# Patient Record
Sex: Female | Born: 1940 | Race: White | Hispanic: No | Smoking: Former smoker
Health system: Southern US, Community
[De-identification: ages and names within clinical notes are randomized; demographics above are authoritative.]

## PROBLEM LIST (undated history)

## (undated) DIAGNOSIS — C3490 Malignant neoplasm of unspecified part of unspecified bronchus or lung: Secondary | ICD-10-CM

## (undated) DIAGNOSIS — J42 Unspecified chronic bronchitis: Secondary | ICD-10-CM

## (undated) DIAGNOSIS — M810 Age-related osteoporosis without current pathological fracture: Secondary | ICD-10-CM

## (undated) DIAGNOSIS — M1990 Unspecified osteoarthritis, unspecified site: Secondary | ICD-10-CM

## (undated) DIAGNOSIS — F32A Depression, unspecified: Secondary | ICD-10-CM

## (undated) DIAGNOSIS — N2581 Secondary hyperparathyroidism of renal origin: Secondary | ICD-10-CM

## (undated) DIAGNOSIS — N183 Chronic kidney disease, stage 3 unspecified: Secondary | ICD-10-CM

## (undated) DIAGNOSIS — I219 Acute myocardial infarction, unspecified: Secondary | ICD-10-CM

## (undated) DIAGNOSIS — E785 Hyperlipidemia, unspecified: Secondary | ICD-10-CM

## (undated) DIAGNOSIS — J449 Chronic obstructive pulmonary disease, unspecified: Secondary | ICD-10-CM

## (undated) DIAGNOSIS — F329 Major depressive disorder, single episode, unspecified: Secondary | ICD-10-CM

## (undated) DIAGNOSIS — G459 Transient cerebral ischemic attack, unspecified: Secondary | ICD-10-CM

## (undated) DIAGNOSIS — H179 Unspecified corneal scar and opacity: Secondary | ICD-10-CM

## (undated) DIAGNOSIS — F4320 Adjustment disorder, unspecified: Secondary | ICD-10-CM

## (undated) DIAGNOSIS — R0902 Hypoxemia: Secondary | ICD-10-CM

## (undated) DIAGNOSIS — J439 Emphysema, unspecified: Secondary | ICD-10-CM

## (undated) DIAGNOSIS — I4891 Unspecified atrial fibrillation: Secondary | ICD-10-CM

## (undated) DIAGNOSIS — F419 Anxiety disorder, unspecified: Secondary | ICD-10-CM

## (undated) DIAGNOSIS — Z9181 History of falling: Secondary | ICD-10-CM

## (undated) DIAGNOSIS — Z8781 Personal history of (healed) traumatic fracture: Secondary | ICD-10-CM

## (undated) DIAGNOSIS — K5790 Diverticulosis of intestine, part unspecified, without perforation or abscess without bleeding: Secondary | ICD-10-CM

## (undated) DIAGNOSIS — J309 Allergic rhinitis, unspecified: Secondary | ICD-10-CM

## (undated) DIAGNOSIS — I10 Essential (primary) hypertension: Secondary | ICD-10-CM

## (undated) HISTORY — DX: Essential (primary) hypertension: I10

## (undated) HISTORY — DX: Age-related osteoporosis without current pathological fracture: M81.0

## (undated) HISTORY — PX: ESOPHAGUS SURGERY: SHX626

## (undated) HISTORY — DX: Unspecified corneal scar and opacity: H17.9

## (undated) HISTORY — DX: Hyperlipidemia, unspecified: E78.5

## (undated) HISTORY — DX: Emphysema, unspecified: J43.9

## (undated) HISTORY — DX: History of falling: Z91.81

## (undated) HISTORY — DX: Unspecified chronic bronchitis: J42

## (undated) HISTORY — DX: Chronic kidney disease, stage 3 unspecified: N18.30

## (undated) HISTORY — DX: Allergic rhinitis, unspecified: J30.9

## (undated) HISTORY — DX: Hypoxemia: R09.02

## (undated) HISTORY — DX: Anxiety disorder, unspecified: F41.9

## (undated) HISTORY — DX: Depression, unspecified: F32.A

## (undated) HISTORY — DX: Diverticulosis of intestine, part unspecified, without perforation or abscess without bleeding: K57.90

## (undated) HISTORY — DX: Unspecified osteoarthritis, unspecified site: M19.90

## (undated) HISTORY — DX: Chronic kidney disease, stage 3 (moderate): N18.3

## (undated) HISTORY — DX: Transient cerebral ischemic attack, unspecified: G45.9

## (undated) HISTORY — DX: Major depressive disorder, single episode, unspecified: F32.9

## (undated) HISTORY — DX: Malignant neoplasm of unspecified part of unspecified bronchus or lung: C34.90

## (undated) HISTORY — DX: Adjustment disorder, unspecified: F43.20

## (undated) HISTORY — DX: Secondary hyperparathyroidism of renal origin: N25.81

## (undated) HISTORY — DX: Unspecified atrial fibrillation: I48.91

---
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
## 2009-06-05 DIAGNOSIS — I639 Cerebral infarction, unspecified: Secondary | ICD-10-CM

## 2009-06-05 HISTORY — DX: Cerebral infarction, unspecified: I63.9

## 2011-02-16 ENCOUNTER — Ambulatory Visit: Payer: Self-pay | Admitting: Otolaryngology

## 2011-02-20 ENCOUNTER — Ambulatory Visit: Payer: Self-pay | Admitting: Otolaryngology

## 2011-06-27 DIAGNOSIS — Z7901 Long term (current) use of anticoagulants: Secondary | ICD-10-CM | POA: Diagnosis not present

## 2011-07-27 DIAGNOSIS — Z7901 Long term (current) use of anticoagulants: Secondary | ICD-10-CM | POA: Diagnosis not present

## 2011-08-24 DIAGNOSIS — F411 Generalized anxiety disorder: Secondary | ICD-10-CM | POA: Diagnosis not present

## 2011-08-24 DIAGNOSIS — I10 Essential (primary) hypertension: Secondary | ICD-10-CM | POA: Diagnosis not present

## 2011-08-24 DIAGNOSIS — E785 Hyperlipidemia, unspecified: Secondary | ICD-10-CM | POA: Diagnosis not present

## 2011-08-24 DIAGNOSIS — Z7901 Long term (current) use of anticoagulants: Secondary | ICD-10-CM | POA: Diagnosis not present

## 2011-09-21 DIAGNOSIS — Z7901 Long term (current) use of anticoagulants: Secondary | ICD-10-CM | POA: Diagnosis not present

## 2011-10-25 DIAGNOSIS — Z7901 Long term (current) use of anticoagulants: Secondary | ICD-10-CM | POA: Diagnosis not present

## 2011-11-01 ENCOUNTER — Ambulatory Visit: Payer: Self-pay | Admitting: Family Medicine

## 2011-11-01 DIAGNOSIS — M949 Disorder of cartilage, unspecified: Secondary | ICD-10-CM | POA: Diagnosis not present

## 2011-11-01 DIAGNOSIS — M899 Disorder of bone, unspecified: Secondary | ICD-10-CM | POA: Diagnosis not present

## 2011-11-21 DIAGNOSIS — Z7901 Long term (current) use of anticoagulants: Secondary | ICD-10-CM | POA: Diagnosis not present

## 2011-11-24 DIAGNOSIS — Z1211 Encounter for screening for malignant neoplasm of colon: Secondary | ICD-10-CM | POA: Diagnosis not present

## 2011-12-19 DIAGNOSIS — Z7901 Long term (current) use of anticoagulants: Secondary | ICD-10-CM | POA: Diagnosis not present

## 2012-01-22 DIAGNOSIS — Z7901 Long term (current) use of anticoagulants: Secondary | ICD-10-CM | POA: Diagnosis not present

## 2012-02-07 DIAGNOSIS — Z7901 Long term (current) use of anticoagulants: Secondary | ICD-10-CM | POA: Diagnosis not present

## 2012-02-22 DIAGNOSIS — E785 Hyperlipidemia, unspecified: Secondary | ICD-10-CM | POA: Diagnosis not present

## 2012-02-22 DIAGNOSIS — F17200 Nicotine dependence, unspecified, uncomplicated: Secondary | ICD-10-CM | POA: Diagnosis not present

## 2012-02-22 DIAGNOSIS — J40 Bronchitis, not specified as acute or chronic: Secondary | ICD-10-CM | POA: Diagnosis not present

## 2012-02-22 DIAGNOSIS — I10 Essential (primary) hypertension: Secondary | ICD-10-CM | POA: Diagnosis not present

## 2012-03-08 DIAGNOSIS — I10 Essential (primary) hypertension: Secondary | ICD-10-CM | POA: Diagnosis not present

## 2012-03-08 DIAGNOSIS — F411 Generalized anxiety disorder: Secondary | ICD-10-CM | POA: Diagnosis not present

## 2012-03-08 DIAGNOSIS — I4891 Unspecified atrial fibrillation: Secondary | ICD-10-CM | POA: Diagnosis not present

## 2012-03-08 DIAGNOSIS — Z7901 Long term (current) use of anticoagulants: Secondary | ICD-10-CM | POA: Diagnosis not present

## 2012-03-08 DIAGNOSIS — E785 Hyperlipidemia, unspecified: Secondary | ICD-10-CM | POA: Diagnosis not present

## 2012-03-12 ENCOUNTER — Ambulatory Visit: Payer: Self-pay | Admitting: Family Medicine

## 2012-03-12 DIAGNOSIS — R059 Cough, unspecified: Secondary | ICD-10-CM | POA: Diagnosis not present

## 2012-03-12 DIAGNOSIS — R05 Cough: Secondary | ICD-10-CM | POA: Diagnosis not present

## 2012-03-14 DIAGNOSIS — L821 Other seborrheic keratosis: Secondary | ICD-10-CM | POA: Diagnosis not present

## 2012-03-14 DIAGNOSIS — R490 Dysphonia: Secondary | ICD-10-CM | POA: Diagnosis not present

## 2012-03-14 DIAGNOSIS — J301 Allergic rhinitis due to pollen: Secondary | ICD-10-CM | POA: Diagnosis not present

## 2012-03-14 DIAGNOSIS — R0982 Postnasal drip: Secondary | ICD-10-CM | POA: Diagnosis not present

## 2012-03-14 DIAGNOSIS — B079 Viral wart, unspecified: Secondary | ICD-10-CM | POA: Diagnosis not present

## 2012-03-25 DIAGNOSIS — Z7901 Long term (current) use of anticoagulants: Secondary | ICD-10-CM | POA: Diagnosis not present

## 2012-04-04 DIAGNOSIS — B079 Viral wart, unspecified: Secondary | ICD-10-CM | POA: Diagnosis not present

## 2012-04-04 DIAGNOSIS — L909 Atrophic disorder of skin, unspecified: Secondary | ICD-10-CM | POA: Diagnosis not present

## 2012-04-04 DIAGNOSIS — D2330 Other benign neoplasm of skin of unspecified part of face: Secondary | ICD-10-CM | POA: Diagnosis not present

## 2012-04-04 DIAGNOSIS — L821 Other seborrheic keratosis: Secondary | ICD-10-CM | POA: Diagnosis not present

## 2012-04-08 DIAGNOSIS — I10 Essential (primary) hypertension: Secondary | ICD-10-CM | POA: Diagnosis not present

## 2012-04-08 DIAGNOSIS — Z7901 Long term (current) use of anticoagulants: Secondary | ICD-10-CM | POA: Diagnosis not present

## 2012-04-08 DIAGNOSIS — I6529 Occlusion and stenosis of unspecified carotid artery: Secondary | ICD-10-CM | POA: Diagnosis not present

## 2012-04-08 DIAGNOSIS — F411 Generalized anxiety disorder: Secondary | ICD-10-CM | POA: Diagnosis not present

## 2012-04-16 ENCOUNTER — Ambulatory Visit: Payer: Self-pay | Admitting: Family Medicine

## 2012-04-16 DIAGNOSIS — I658 Occlusion and stenosis of other precerebral arteries: Secondary | ICD-10-CM | POA: Diagnosis not present

## 2012-04-16 DIAGNOSIS — I6529 Occlusion and stenosis of unspecified carotid artery: Secondary | ICD-10-CM | POA: Diagnosis not present

## 2012-04-23 DIAGNOSIS — I10 Essential (primary) hypertension: Secondary | ICD-10-CM | POA: Diagnosis not present

## 2012-04-23 DIAGNOSIS — E785 Hyperlipidemia, unspecified: Secondary | ICD-10-CM | POA: Diagnosis not present

## 2012-04-23 DIAGNOSIS — Z7901 Long term (current) use of anticoagulants: Secondary | ICD-10-CM | POA: Diagnosis not present

## 2012-04-23 DIAGNOSIS — I499 Cardiac arrhythmia, unspecified: Secondary | ICD-10-CM | POA: Diagnosis not present

## 2012-04-25 DIAGNOSIS — B079 Viral wart, unspecified: Secondary | ICD-10-CM | POA: Diagnosis not present

## 2012-05-15 DIAGNOSIS — I10 Essential (primary) hypertension: Secondary | ICD-10-CM | POA: Diagnosis not present

## 2012-05-15 DIAGNOSIS — Z7901 Long term (current) use of anticoagulants: Secondary | ICD-10-CM | POA: Diagnosis not present

## 2012-05-15 DIAGNOSIS — F17200 Nicotine dependence, unspecified, uncomplicated: Secondary | ICD-10-CM | POA: Diagnosis not present

## 2012-05-15 DIAGNOSIS — F411 Generalized anxiety disorder: Secondary | ICD-10-CM | POA: Diagnosis not present

## 2012-05-16 DIAGNOSIS — L280 Lichen simplex chronicus: Secondary | ICD-10-CM | POA: Diagnosis not present

## 2012-05-16 DIAGNOSIS — B079 Viral wart, unspecified: Secondary | ICD-10-CM | POA: Diagnosis not present

## 2012-05-17 DIAGNOSIS — Z1231 Encounter for screening mammogram for malignant neoplasm of breast: Secondary | ICD-10-CM | POA: Diagnosis not present

## 2012-06-14 DIAGNOSIS — I10 Essential (primary) hypertension: Secondary | ICD-10-CM | POA: Diagnosis not present

## 2012-06-14 DIAGNOSIS — Z7901 Long term (current) use of anticoagulants: Secondary | ICD-10-CM | POA: Diagnosis not present

## 2012-07-15 DIAGNOSIS — E785 Hyperlipidemia, unspecified: Secondary | ICD-10-CM | POA: Diagnosis not present

## 2012-07-15 DIAGNOSIS — I10 Essential (primary) hypertension: Secondary | ICD-10-CM | POA: Diagnosis not present

## 2012-07-15 DIAGNOSIS — Z7901 Long term (current) use of anticoagulants: Secondary | ICD-10-CM | POA: Diagnosis not present

## 2012-08-12 DIAGNOSIS — Z7901 Long term (current) use of anticoagulants: Secondary | ICD-10-CM | POA: Diagnosis not present

## 2012-08-12 DIAGNOSIS — I10 Essential (primary) hypertension: Secondary | ICD-10-CM | POA: Diagnosis not present

## 2012-08-14 DIAGNOSIS — H16109 Unspecified superficial keratitis, unspecified eye: Secondary | ICD-10-CM | POA: Diagnosis not present

## 2012-08-29 DIAGNOSIS — R1031 Right lower quadrant pain: Secondary | ICD-10-CM | POA: Diagnosis not present

## 2012-08-29 DIAGNOSIS — R1032 Left lower quadrant pain: Secondary | ICD-10-CM | POA: Diagnosis not present

## 2012-08-29 DIAGNOSIS — R198 Other specified symptoms and signs involving the digestive system and abdomen: Secondary | ICD-10-CM | POA: Diagnosis not present

## 2012-08-30 DIAGNOSIS — R197 Diarrhea, unspecified: Secondary | ICD-10-CM | POA: Diagnosis not present

## 2012-09-12 DIAGNOSIS — I10 Essential (primary) hypertension: Secondary | ICD-10-CM | POA: Diagnosis not present

## 2012-09-12 DIAGNOSIS — F411 Generalized anxiety disorder: Secondary | ICD-10-CM | POA: Diagnosis not present

## 2012-09-12 DIAGNOSIS — Z7901 Long term (current) use of anticoagulants: Secondary | ICD-10-CM | POA: Diagnosis not present

## 2012-09-19 ENCOUNTER — Other Ambulatory Visit: Payer: Self-pay | Admitting: Gastroenterology

## 2012-09-19 DIAGNOSIS — R197 Diarrhea, unspecified: Secondary | ICD-10-CM | POA: Diagnosis not present

## 2012-09-19 LAB — CLOSTRIDIUM DIFFICILE BY PCR

## 2012-10-01 ENCOUNTER — Ambulatory Visit: Payer: Self-pay | Admitting: Gastroenterology

## 2012-10-01 DIAGNOSIS — Z5309 Procedure and treatment not carried out because of other contraindication: Secondary | ICD-10-CM | POA: Diagnosis not present

## 2012-10-01 DIAGNOSIS — Q438 Other specified congenital malformations of intestine: Secondary | ICD-10-CM | POA: Diagnosis not present

## 2012-10-01 DIAGNOSIS — Z79899 Other long term (current) drug therapy: Secondary | ICD-10-CM | POA: Diagnosis not present

## 2012-10-01 DIAGNOSIS — I499 Cardiac arrhythmia, unspecified: Secondary | ICD-10-CM | POA: Diagnosis not present

## 2012-10-01 DIAGNOSIS — Z885 Allergy status to narcotic agent status: Secondary | ICD-10-CM | POA: Diagnosis not present

## 2012-10-01 DIAGNOSIS — I10 Essential (primary) hypertension: Secondary | ICD-10-CM | POA: Diagnosis not present

## 2012-10-01 DIAGNOSIS — Z7901 Long term (current) use of anticoagulants: Secondary | ICD-10-CM | POA: Diagnosis not present

## 2012-10-01 DIAGNOSIS — F17200 Nicotine dependence, unspecified, uncomplicated: Secondary | ICD-10-CM | POA: Diagnosis not present

## 2012-10-01 DIAGNOSIS — R197 Diarrhea, unspecified: Secondary | ICD-10-CM | POA: Diagnosis not present

## 2012-10-01 DIAGNOSIS — Z8673 Personal history of transient ischemic attack (TIA), and cerebral infarction without residual deficits: Secondary | ICD-10-CM | POA: Diagnosis not present

## 2012-10-01 DIAGNOSIS — R198 Other specified symptoms and signs involving the digestive system and abdomen: Secondary | ICD-10-CM | POA: Diagnosis not present

## 2012-10-01 DIAGNOSIS — F411 Generalized anxiety disorder: Secondary | ICD-10-CM | POA: Diagnosis not present

## 2012-10-01 DIAGNOSIS — K5730 Diverticulosis of large intestine without perforation or abscess without bleeding: Secondary | ICD-10-CM | POA: Diagnosis not present

## 2012-10-01 DIAGNOSIS — Z8042 Family history of malignant neoplasm of prostate: Secondary | ICD-10-CM | POA: Diagnosis not present

## 2012-10-01 LAB — PROTIME-INR
INR: 1.1
Prothrombin Time: 13.9 secs (ref 11.5–14.7)

## 2012-10-08 DIAGNOSIS — R109 Unspecified abdominal pain: Secondary | ICD-10-CM | POA: Diagnosis not present

## 2012-10-08 DIAGNOSIS — E785 Hyperlipidemia, unspecified: Secondary | ICD-10-CM | POA: Diagnosis not present

## 2012-10-08 DIAGNOSIS — I10 Essential (primary) hypertension: Secondary | ICD-10-CM | POA: Diagnosis not present

## 2012-10-08 DIAGNOSIS — Z7901 Long term (current) use of anticoagulants: Secondary | ICD-10-CM | POA: Diagnosis not present

## 2012-10-08 DIAGNOSIS — R319 Hematuria, unspecified: Secondary | ICD-10-CM | POA: Diagnosis not present

## 2012-10-08 DIAGNOSIS — F411 Generalized anxiety disorder: Secondary | ICD-10-CM | POA: Diagnosis not present

## 2012-10-22 DIAGNOSIS — Z7901 Long term (current) use of anticoagulants: Secondary | ICD-10-CM | POA: Diagnosis not present

## 2012-10-22 DIAGNOSIS — E785 Hyperlipidemia, unspecified: Secondary | ICD-10-CM | POA: Diagnosis not present

## 2012-10-22 DIAGNOSIS — I10 Essential (primary) hypertension: Secondary | ICD-10-CM | POA: Diagnosis not present

## 2012-10-22 DIAGNOSIS — R079 Chest pain, unspecified: Secondary | ICD-10-CM | POA: Diagnosis not present

## 2012-11-07 DIAGNOSIS — R197 Diarrhea, unspecified: Secondary | ICD-10-CM | POA: Diagnosis not present

## 2012-11-21 DIAGNOSIS — Z7901 Long term (current) use of anticoagulants: Secondary | ICD-10-CM | POA: Diagnosis not present

## 2012-11-21 DIAGNOSIS — R079 Chest pain, unspecified: Secondary | ICD-10-CM | POA: Diagnosis not present

## 2012-11-21 DIAGNOSIS — I10 Essential (primary) hypertension: Secondary | ICD-10-CM | POA: Diagnosis not present

## 2012-11-21 DIAGNOSIS — R42 Dizziness and giddiness: Secondary | ICD-10-CM | POA: Diagnosis not present

## 2012-12-17 DIAGNOSIS — F411 Generalized anxiety disorder: Secondary | ICD-10-CM | POA: Diagnosis not present

## 2012-12-17 DIAGNOSIS — I10 Essential (primary) hypertension: Secondary | ICD-10-CM | POA: Diagnosis not present

## 2012-12-17 DIAGNOSIS — Z7901 Long term (current) use of anticoagulants: Secondary | ICD-10-CM | POA: Diagnosis not present

## 2012-12-26 DIAGNOSIS — B0050 Herpesviral ocular disease, unspecified: Secondary | ICD-10-CM | POA: Diagnosis not present

## 2013-01-03 ENCOUNTER — Ambulatory Visit: Payer: Self-pay

## 2013-01-17 DIAGNOSIS — F411 Generalized anxiety disorder: Secondary | ICD-10-CM | POA: Diagnosis not present

## 2013-01-17 DIAGNOSIS — I10 Essential (primary) hypertension: Secondary | ICD-10-CM | POA: Diagnosis not present

## 2013-01-17 DIAGNOSIS — Z7901 Long term (current) use of anticoagulants: Secondary | ICD-10-CM | POA: Diagnosis not present

## 2013-02-17 DIAGNOSIS — Z7901 Long term (current) use of anticoagulants: Secondary | ICD-10-CM | POA: Diagnosis not present

## 2013-02-17 DIAGNOSIS — J40 Bronchitis, not specified as acute or chronic: Secondary | ICD-10-CM | POA: Diagnosis not present

## 2013-02-17 DIAGNOSIS — E785 Hyperlipidemia, unspecified: Secondary | ICD-10-CM | POA: Diagnosis not present

## 2013-03-19 DIAGNOSIS — F411 Generalized anxiety disorder: Secondary | ICD-10-CM | POA: Diagnosis not present

## 2013-03-19 DIAGNOSIS — Z7901 Long term (current) use of anticoagulants: Secondary | ICD-10-CM | POA: Diagnosis not present

## 2013-03-19 DIAGNOSIS — I10 Essential (primary) hypertension: Secondary | ICD-10-CM | POA: Diagnosis not present

## 2013-03-19 DIAGNOSIS — Z23 Encounter for immunization: Secondary | ICD-10-CM | POA: Diagnosis not present

## 2013-04-23 DIAGNOSIS — Z7901 Long term (current) use of anticoagulants: Secondary | ICD-10-CM | POA: Diagnosis not present

## 2013-06-16 DIAGNOSIS — F411 Generalized anxiety disorder: Secondary | ICD-10-CM | POA: Diagnosis not present

## 2013-06-16 DIAGNOSIS — E785 Hyperlipidemia, unspecified: Secondary | ICD-10-CM | POA: Diagnosis not present

## 2013-06-16 DIAGNOSIS — Z7901 Long term (current) use of anticoagulants: Secondary | ICD-10-CM | POA: Diagnosis not present

## 2013-07-17 ENCOUNTER — Ambulatory Visit: Payer: Self-pay

## 2013-07-28 DIAGNOSIS — F411 Generalized anxiety disorder: Secondary | ICD-10-CM | POA: Diagnosis not present

## 2013-07-28 DIAGNOSIS — Z7901 Long term (current) use of anticoagulants: Secondary | ICD-10-CM | POA: Diagnosis not present

## 2013-07-28 DIAGNOSIS — I10 Essential (primary) hypertension: Secondary | ICD-10-CM | POA: Diagnosis not present

## 2013-07-29 DIAGNOSIS — D485 Neoplasm of uncertain behavior of skin: Secondary | ICD-10-CM | POA: Diagnosis not present

## 2013-07-29 DIAGNOSIS — L820 Inflamed seborrheic keratosis: Secondary | ICD-10-CM | POA: Diagnosis not present

## 2013-07-29 DIAGNOSIS — L03019 Cellulitis of unspecified finger: Secondary | ICD-10-CM | POA: Diagnosis not present

## 2013-08-14 DIAGNOSIS — J40 Bronchitis, not specified as acute or chronic: Secondary | ICD-10-CM | POA: Diagnosis not present

## 2013-08-26 DIAGNOSIS — L03019 Cellulitis of unspecified finger: Secondary | ICD-10-CM | POA: Diagnosis not present

## 2013-09-15 DIAGNOSIS — E785 Hyperlipidemia, unspecified: Secondary | ICD-10-CM | POA: Diagnosis not present

## 2013-09-15 DIAGNOSIS — F411 Generalized anxiety disorder: Secondary | ICD-10-CM | POA: Diagnosis not present

## 2013-09-15 DIAGNOSIS — I10 Essential (primary) hypertension: Secondary | ICD-10-CM | POA: Diagnosis not present

## 2013-09-15 DIAGNOSIS — Z7901 Long term (current) use of anticoagulants: Secondary | ICD-10-CM | POA: Diagnosis not present

## 2013-11-01 ENCOUNTER — Ambulatory Visit: Payer: Self-pay | Admitting: Medical

## 2013-11-01 DIAGNOSIS — R21 Rash and other nonspecific skin eruption: Secondary | ICD-10-CM | POA: Diagnosis not present

## 2013-11-01 DIAGNOSIS — F3289 Other specified depressive episodes: Secondary | ICD-10-CM | POA: Diagnosis not present

## 2013-11-01 DIAGNOSIS — F17200 Nicotine dependence, unspecified, uncomplicated: Secondary | ICD-10-CM | POA: Diagnosis not present

## 2013-11-01 DIAGNOSIS — Z79899 Other long term (current) drug therapy: Secondary | ICD-10-CM | POA: Diagnosis not present

## 2013-11-01 DIAGNOSIS — Z7901 Long term (current) use of anticoagulants: Secondary | ICD-10-CM | POA: Diagnosis not present

## 2013-11-01 DIAGNOSIS — S90569A Insect bite (nonvenomous), unspecified ankle, initial encounter: Secondary | ICD-10-CM | POA: Diagnosis not present

## 2013-11-01 DIAGNOSIS — F329 Major depressive disorder, single episode, unspecified: Secondary | ICD-10-CM | POA: Diagnosis not present

## 2013-11-01 DIAGNOSIS — F411 Generalized anxiety disorder: Secondary | ICD-10-CM | POA: Diagnosis not present

## 2013-11-01 DIAGNOSIS — I10 Essential (primary) hypertension: Secondary | ICD-10-CM | POA: Diagnosis not present

## 2013-11-03 DIAGNOSIS — Z7901 Long term (current) use of anticoagulants: Secondary | ICD-10-CM | POA: Diagnosis not present

## 2013-11-03 DIAGNOSIS — J40 Bronchitis, not specified as acute or chronic: Secondary | ICD-10-CM | POA: Diagnosis not present

## 2013-11-03 DIAGNOSIS — R42 Dizziness and giddiness: Secondary | ICD-10-CM | POA: Diagnosis not present

## 2013-11-11 DIAGNOSIS — J40 Bronchitis, not specified as acute or chronic: Secondary | ICD-10-CM | POA: Diagnosis not present

## 2013-11-11 DIAGNOSIS — R42 Dizziness and giddiness: Secondary | ICD-10-CM | POA: Diagnosis not present

## 2013-11-11 DIAGNOSIS — F17200 Nicotine dependence, unspecified, uncomplicated: Secondary | ICD-10-CM | POA: Diagnosis not present

## 2013-11-11 DIAGNOSIS — Z7901 Long term (current) use of anticoagulants: Secondary | ICD-10-CM | POA: Diagnosis not present

## 2013-12-16 DIAGNOSIS — I10 Essential (primary) hypertension: Secondary | ICD-10-CM | POA: Diagnosis not present

## 2013-12-16 DIAGNOSIS — F4320 Adjustment disorder, unspecified: Secondary | ICD-10-CM | POA: Diagnosis not present

## 2013-12-16 DIAGNOSIS — Z7901 Long term (current) use of anticoagulants: Secondary | ICD-10-CM | POA: Diagnosis not present

## 2013-12-24 DIAGNOSIS — R21 Rash and other nonspecific skin eruption: Secondary | ICD-10-CM | POA: Diagnosis not present

## 2013-12-24 DIAGNOSIS — H179 Unspecified corneal scar and opacity: Secondary | ICD-10-CM | POA: Diagnosis not present

## 2013-12-25 DIAGNOSIS — B0052 Herpesviral keratitis: Secondary | ICD-10-CM | POA: Diagnosis not present

## 2014-01-27 DIAGNOSIS — I10 Essential (primary) hypertension: Secondary | ICD-10-CM | POA: Diagnosis not present

## 2014-01-27 DIAGNOSIS — Z7901 Long term (current) use of anticoagulants: Secondary | ICD-10-CM | POA: Diagnosis not present

## 2014-01-27 DIAGNOSIS — E785 Hyperlipidemia, unspecified: Secondary | ICD-10-CM | POA: Diagnosis not present

## 2014-03-10 DIAGNOSIS — Z23 Encounter for immunization: Secondary | ICD-10-CM | POA: Diagnosis not present

## 2014-03-10 DIAGNOSIS — Z7901 Long term (current) use of anticoagulants: Secondary | ICD-10-CM | POA: Diagnosis not present

## 2014-03-10 DIAGNOSIS — F419 Anxiety disorder, unspecified: Secondary | ICD-10-CM | POA: Diagnosis not present

## 2014-03-10 DIAGNOSIS — I129 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 stage 4 chronic kidney disease, or unspecified chronic kidney disease: Secondary | ICD-10-CM | POA: Diagnosis not present

## 2014-03-10 DIAGNOSIS — Z1389 Encounter for screening for other disorder: Secondary | ICD-10-CM | POA: Diagnosis not present

## 2014-03-10 DIAGNOSIS — N183 Chronic kidney disease, stage 3 (moderate): Secondary | ICD-10-CM | POA: Diagnosis not present

## 2014-03-10 DIAGNOSIS — R296 Repeated falls: Secondary | ICD-10-CM | POA: Diagnosis not present

## 2014-03-23 DIAGNOSIS — Z7901 Long term (current) use of anticoagulants: Secondary | ICD-10-CM | POA: Diagnosis not present

## 2014-03-23 DIAGNOSIS — I10 Essential (primary) hypertension: Secondary | ICD-10-CM | POA: Diagnosis not present

## 2014-04-21 DIAGNOSIS — Z7901 Long term (current) use of anticoagulants: Secondary | ICD-10-CM | POA: Diagnosis not present

## 2014-04-21 DIAGNOSIS — I10 Essential (primary) hypertension: Secondary | ICD-10-CM | POA: Diagnosis not present

## 2014-04-21 DIAGNOSIS — E785 Hyperlipidemia, unspecified: Secondary | ICD-10-CM | POA: Diagnosis not present

## 2014-06-05 HISTORY — PX: COLONOSCOPY: SHX174

## 2014-06-09 DIAGNOSIS — I10 Essential (primary) hypertension: Secondary | ICD-10-CM | POA: Diagnosis not present

## 2014-06-09 DIAGNOSIS — Z7901 Long term (current) use of anticoagulants: Secondary | ICD-10-CM | POA: Diagnosis not present

## 2014-07-02 ENCOUNTER — Inpatient Hospital Stay: Payer: Self-pay | Admitting: Internal Medicine

## 2014-07-02 DIAGNOSIS — I4891 Unspecified atrial fibrillation: Secondary | ICD-10-CM | POA: Diagnosis present

## 2014-07-02 DIAGNOSIS — E785 Hyperlipidemia, unspecified: Secondary | ICD-10-CM | POA: Diagnosis not present

## 2014-07-02 DIAGNOSIS — R270 Ataxia, unspecified: Secondary | ICD-10-CM | POA: Diagnosis not present

## 2014-07-02 DIAGNOSIS — I639 Cerebral infarction, unspecified: Secondary | ICD-10-CM | POA: Diagnosis not present

## 2014-07-02 DIAGNOSIS — R42 Dizziness and giddiness: Secondary | ICD-10-CM | POA: Diagnosis not present

## 2014-07-02 DIAGNOSIS — I638 Other cerebral infarction: Secondary | ICD-10-CM | POA: Diagnosis not present

## 2014-07-02 DIAGNOSIS — F17210 Nicotine dependence, cigarettes, uncomplicated: Secondary | ICD-10-CM | POA: Diagnosis present

## 2014-07-02 DIAGNOSIS — R531 Weakness: Secondary | ICD-10-CM | POA: Diagnosis not present

## 2014-07-02 DIAGNOSIS — I10 Essential (primary) hypertension: Secondary | ICD-10-CM | POA: Diagnosis not present

## 2014-07-02 DIAGNOSIS — E780 Pure hypercholesterolemia: Secondary | ICD-10-CM | POA: Diagnosis not present

## 2014-07-02 DIAGNOSIS — Z885 Allergy status to narcotic agent status: Secondary | ICD-10-CM | POA: Diagnosis not present

## 2014-07-02 DIAGNOSIS — Z66 Do not resuscitate: Secondary | ICD-10-CM | POA: Diagnosis present

## 2014-07-02 DIAGNOSIS — Z7902 Long term (current) use of antithrombotics/antiplatelets: Secondary | ICD-10-CM | POA: Diagnosis not present

## 2014-07-02 DIAGNOSIS — Z79899 Other long term (current) drug therapy: Secondary | ICD-10-CM | POA: Diagnosis not present

## 2014-07-02 DIAGNOSIS — G459 Transient cerebral ischemic attack, unspecified: Secondary | ICD-10-CM | POA: Diagnosis not present

## 2014-07-02 DIAGNOSIS — Z720 Tobacco use: Secondary | ICD-10-CM | POA: Diagnosis not present

## 2014-07-02 DIAGNOSIS — I6529 Occlusion and stenosis of unspecified carotid artery: Secondary | ICD-10-CM | POA: Diagnosis present

## 2014-07-02 DIAGNOSIS — I6523 Occlusion and stenosis of bilateral carotid arteries: Secondary | ICD-10-CM | POA: Diagnosis not present

## 2014-07-02 DIAGNOSIS — R278 Other lack of coordination: Secondary | ICD-10-CM | POA: Diagnosis not present

## 2014-07-02 DIAGNOSIS — E784 Other hyperlipidemia: Secondary | ICD-10-CM | POA: Diagnosis not present

## 2014-07-02 LAB — PROTIME-INR
INR: 1.9
Prothrombin Time: 21.1 secs — ABNORMAL HIGH (ref 11.5–14.7)

## 2014-07-02 LAB — BASIC METABOLIC PANEL
ANION GAP: 5 — AB (ref 7–16)
BUN: 14 mg/dL (ref 7–18)
CO2: 27 mmol/L (ref 21–32)
Calcium, Total: 9.1 mg/dL (ref 8.5–10.1)
Chloride: 108 mmol/L — ABNORMAL HIGH (ref 98–107)
Creatinine: 1.2 mg/dL (ref 0.60–1.30)
GFR CALC AF AMER: 57 — AB
GFR CALC NON AF AMER: 47 — AB
GLUCOSE: 92 mg/dL (ref 65–99)
Osmolality: 280 (ref 275–301)
Potassium: 4 mmol/L (ref 3.5–5.1)
SODIUM: 140 mmol/L (ref 136–145)

## 2014-07-02 LAB — CBC
HCT: 42.9 % (ref 35.0–47.0)
HGB: 14.6 g/dL (ref 12.0–16.0)
MCH: 34.3 pg — ABNORMAL HIGH (ref 26.0–34.0)
MCHC: 34 g/dL (ref 32.0–36.0)
MCV: 101 fL — AB (ref 80–100)
PLATELETS: 203 10*3/uL (ref 150–440)
RBC: 4.25 10*6/uL (ref 3.80–5.20)
RDW: 13.6 % (ref 11.5–14.5)
WBC: 8.1 10*3/uL (ref 3.6–11.0)

## 2014-07-02 LAB — PRO B NATRIURETIC PEPTIDE: B-TYPE NATIURETIC PEPTID: 261 pg/mL — AB (ref 0–125)

## 2014-07-02 LAB — TROPONIN I: Troponin-I: <0.02 ng/mL

## 2014-07-03 LAB — BASIC METABOLIC PANEL
Anion Gap: 7 (ref 7–16)
BUN: 15 mg/dL (ref 7–18)
CO2: 25 mmol/L (ref 21–32)
Calcium, Total: 9.3 mg/dL (ref 8.5–10.1)
Chloride: 109 mmol/L — ABNORMAL HIGH (ref 98–107)
Creatinine: 1.31 mg/dL — ABNORMAL HIGH (ref 0.60–1.30)
EGFR (African American): 51 — ABNORMAL LOW
EGFR (Non-African Amer.): 42 — ABNORMAL LOW
Glucose: 90 mg/dL (ref 65–99)
OSMOLALITY: 282 (ref 275–301)
POTASSIUM: 3.9 mmol/L (ref 3.5–5.1)
SODIUM: 141 mmol/L (ref 136–145)

## 2014-07-03 LAB — CBC WITH DIFFERENTIAL/PLATELET
BASOS PCT: 0.9 %
Basophil #: 0.1 10*3/uL (ref 0.0–0.1)
EOS ABS: 0.3 10*3/uL (ref 0.0–0.7)
Eosinophil %: 4.5 %
HCT: 42.1 % (ref 35.0–47.0)
HGB: 14.2 g/dL (ref 12.0–16.0)
LYMPHS PCT: 39.7 %
Lymphocyte #: 2.8 10*3/uL (ref 1.0–3.6)
MCH: 33.9 pg (ref 26.0–34.0)
MCHC: 33.8 g/dL (ref 32.0–36.0)
MCV: 100 fL (ref 80–100)
Monocyte #: 0.5 x10 3/mm (ref 0.2–0.9)
Monocyte %: 7.7 %
NEUTROS PCT: 47.2 %
Neutrophil #: 3.3 10*3/uL (ref 1.4–6.5)
PLATELETS: 180 10*3/uL (ref 150–440)
RBC: 4.2 10*6/uL (ref 3.80–5.20)
RDW: 13.5 % (ref 11.5–14.5)
WBC: 6.9 10*3/uL (ref 3.6–11.0)

## 2014-07-03 LAB — LIPID PANEL
CHOLESTEROL: 144 mg/dL (ref 0–200)
HDL: 33 mg/dL — AB (ref 40–60)
Ldl Cholesterol, Calc: 47 mg/dL (ref 0–100)
Triglycerides: 320 mg/dL — ABNORMAL HIGH (ref 0–200)
VLDL Cholesterol, Calc: 64 mg/dL — ABNORMAL HIGH (ref 5–40)

## 2014-07-16 DIAGNOSIS — Z7901 Long term (current) use of anticoagulants: Secondary | ICD-10-CM | POA: Diagnosis not present

## 2014-07-16 DIAGNOSIS — I10 Essential (primary) hypertension: Secondary | ICD-10-CM | POA: Diagnosis not present

## 2014-07-16 DIAGNOSIS — R42 Dizziness and giddiness: Secondary | ICD-10-CM | POA: Diagnosis not present

## 2014-08-04 DIAGNOSIS — R42 Dizziness and giddiness: Secondary | ICD-10-CM | POA: Insufficient documentation

## 2014-08-06 DIAGNOSIS — F419 Anxiety disorder, unspecified: Secondary | ICD-10-CM | POA: Diagnosis not present

## 2014-08-06 DIAGNOSIS — E785 Hyperlipidemia, unspecified: Secondary | ICD-10-CM | POA: Diagnosis not present

## 2014-08-06 DIAGNOSIS — Z7901 Long term (current) use of anticoagulants: Secondary | ICD-10-CM | POA: Diagnosis not present

## 2014-08-12 DIAGNOSIS — J40 Bronchitis, not specified as acute or chronic: Secondary | ICD-10-CM | POA: Diagnosis not present

## 2014-09-07 DIAGNOSIS — R002 Palpitations: Secondary | ICD-10-CM | POA: Diagnosis not present

## 2014-09-07 DIAGNOSIS — M791 Myalgia: Secondary | ICD-10-CM | POA: Diagnosis not present

## 2014-09-07 DIAGNOSIS — I10 Essential (primary) hypertension: Secondary | ICD-10-CM | POA: Diagnosis not present

## 2014-09-07 DIAGNOSIS — Z7901 Long term (current) use of anticoagulants: Secondary | ICD-10-CM | POA: Diagnosis not present

## 2014-09-23 ENCOUNTER — Encounter: Payer: Self-pay | Admitting: *Deleted

## 2014-09-24 ENCOUNTER — Encounter (INDEPENDENT_AMBULATORY_CARE_PROVIDER_SITE_OTHER): Payer: Self-pay

## 2014-09-24 ENCOUNTER — Encounter: Payer: Self-pay | Admitting: Cardiovascular Disease

## 2014-09-24 ENCOUNTER — Ambulatory Visit (INDEPENDENT_AMBULATORY_CARE_PROVIDER_SITE_OTHER): Payer: Medicare Other | Admitting: Cardiovascular Disease

## 2014-09-24 VITALS — BP 110/70 | HR 68 | Ht 67.0 in | Wt 150.5 lb

## 2014-09-24 DIAGNOSIS — G459 Transient cerebral ischemic attack, unspecified: Secondary | ICD-10-CM | POA: Diagnosis not present

## 2014-09-24 DIAGNOSIS — I10 Essential (primary) hypertension: Secondary | ICD-10-CM | POA: Insufficient documentation

## 2014-09-24 DIAGNOSIS — I4891 Unspecified atrial fibrillation: Secondary | ICD-10-CM | POA: Diagnosis not present

## 2014-09-24 NOTE — Progress Notes (Signed)
Cardiology Office Note   Date:  09/24/2014   ID:  Christine Burnett, Christine Burnett 28-Jul-1940, MRN [id number]  PCP:  Keith Rake, MD  Cardiologist:   Thayer Headings, MD   Chief Complaint  Patient presents with  . Palpitations   Problem list: 1. Hypertension 2. Possible atrial fibrillation 3. Chronic disease 4. Hypertension   History of Present Illness: Christine Burnett is a 74 y.o. female who presents for evaluation of several issues including palpitations, unsteady gait, and question of Coumadin indications.  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 She was found have mild to moderate carotid disease approximate 25 years ago. She's been on Coumadin since that time. She describes having some occasional palpitations that last for less than a minute. Sometimes they only last for a second or so. She's never had a prolonged episodes of palpitations and has never been told that she has atrial fibrillation.   She does have atrial fibrillation listed under past medical history that she does not recall ever being diagnosed with that.  She was told that she has had a TIA in the past.  She continues to smoke. 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 These symptoms improved when she decreased her atenolol to one half her previous dose.    Past Medical History  Diagnosis Date  . Hypertension   . A-fib   . CKD (chronic kidney disease), stage III   . Cornea scar   . Anxiety   . Risk for falls   . Situational disturbance   . Diverticulosis   . Chronic bronchitis   . Allergic rhinitis   . Osteoporosis   . TIA (transient ischemic attack)   . Hyperlipidemia     Past Surgical History  Procedure Laterality Date  . Esophagus surgery       Current Outpatient Prescriptions  Medication Sig Dispense Refill  . ALPRAZolam (XANAX) 0.25 MG tablet Take 0.25 mg by mouth at bedtime as needed for anxiety.    Marland Kitchen atenolol (TENORMIN) 25 MG tablet Take 12.5 mg by mouth daily.      Marland Kitchen PARoxetine (PAXIL) 20 MG tablet Take 20 mg by mouth daily.    . pravastatin (PRAVACHOL) 20 MG tablet Take 20 mg by mouth daily.    Marland Kitchen warfarin (COUMADIN) 2 MG tablet Take 2 mg by mouth as directed.      No current facility-administered medications for this visit.    Allergies:   Cefuroxime axetil; Ciprofloxacin; and Codeine    Social History:  The patient  reports that she has been smoking Cigarettes.  She has a 15.25 pack-year smoking history. She does not have any smokeless tobacco history on file. She reports that she does not drink alcohol or use illicit drugs.   Family History:  The patient's family history includes Heart attack in her father; Hypertension in her mother.    ROS:  Please see the history of present illness.    Review of Systems: Constitutional:  denies fever, chills, diaphoresis, appetite change and fatigue.  HEENT: denies photophobia, eye pain, redness, hearing loss, ear pain, congestion, sore throat, rhinorrhea, sneezing, neck pain, neck stiffness and tinnitus.  Respiratory: denies SOB, DOE, cough, chest tightness, and wheezing.  Cardiovascular: denies chest pain, palpitations and leg swelling.  Gastrointestinal: denies nausea, vomiting, abdominal pain, diarrhea, constipation, blood in stool.  Genitourinary: denies dysuria, urgency, frequency, hematuria, flank pain and difficulty urinating.  Musculoskeletal: denies  myalgias, back pain, joint swelling, arthralgias and gait problem.   Skin: denies  pallor, rash and wound.  Neurological: denies dizziness, seizures, syncope, weakness, light-headedness, numbness and headaches.   Hematological: denies adenopathy, easy bruising, personal or family bleeding history.  Psychiatric/ Behavioral: denies suicidal ideation, mood changes, confusion, nervousness, sleep disturbance and agitation.       All other systems are reviewed and negative.    PHYSICAL EXAM: VS:  BP 110/70 mmHg  Pulse 68  Ht '5\' 7"'$  (1.702 m)  Wt  150 lb 8 oz (68.266 kg)  BMI 23.57 kg/m2 , BMI Body mass index is 23.57 kg/(m^2). GEN: Well nourished, well developed, in no acute distress HEENT: normal Neck: no JVD, carotid bruits, or masses Cardiac: RRR; no murmurs, rubs, or gallops,no edema  Respiratory:  clear to auscultation bilaterally, normal work of breathing GI: soft, nontender, nondistended, + BS MS: no deformity or atrophy Skin: warm and dry, no rash Neuro:  Strength and sensation are intact Psych: normal   EKG:  EKG is ordered today. The ekg ordered today demonstrates NSR at 68.  No ST or T wave changes.    Recent Labs: No results found for requested labs within last 365 days.    Lipid Panel No results found for: CHOL, TRIG, HDL, CHOLHDL, VLDL, LDLCALC, LDLDIRECT    Wt Readings from Last 3 Encounters:  09/24/14 150 lb 8 oz (68.266 kg)      Other studies Reviewed: Additional studies/ records that were reviewed today include: records from Dr. Manuella Ghazi. Review of the above records demonstrates: possible hx of atrial fib   ASSESSMENT AND PLAN:  1.  ? Atrial fibrillation: The patient is never heard of atrial fibrillation and does not describe symptoms that are consistent with atrial fibrillation but her past medical history from Dr. Raul Del office does include the diagnosis of atrial fibrillation. She's on Coumadin. The notes that I have suggested that she is on Coumadin because of cerebrovascular disease but in fact if she's on the Coumadin for history of A. fib and a history of TIA than that is entirely appropriate.  Until we have a better understanding of whether or not she has A. fib, she should probably continue the Coumadin. I offered to place a 30 day event monitor but she refused.  If she does stay on the coumadin, we can have her followed in our coumadin clinic.   She will follow up with Dr. Manuella Ghazi.     2. History of carotid artery disease: The patient has moderate carotid artery disease. She's currently on  Coumadin. Her understanding was that she was put on Coumadin for this mild carotid artery disease 25 years ago. We discussed the fact that patient's are typically treated with aspirin or Plavix for carotid artery disease..  3. Essential hypertension: Well-controlled  I will see her on an as needed basis.  Current medicines are reviewed at length with the patient today.  The patient does not have concerns regarding medicines.  The following changes have been made:  no change  Labs/ tests ordered today include:   Orders Placed This Encounter  Procedures  . EKG 12-Lead     Disposition:   FU with me as needed     Signed, Nahser, Wonda Cheng, MD  09/24/2014 11:57 AM    [facility] [address], [place], [place]  [id number] Phone: [phone]; Fax: [phone]

## 2014-09-24 NOTE — Patient Instructions (Signed)
We will see you back on an as needed basis.  Your physician recommends that you continue on your current medications as directed. Please refer to the Current Medication list given to you today.

## 2014-10-04 NOTE — Discharge Summary (Signed)
PATIENT NAME:  Christine Burnett, Christine Burnett MR#:  [id number] DATE OF BIRTH:  Sep 25, 1940  DATE OF ADMISSION:  07/02/2014 DATE OF DISCHARGE:  07/03/2014  PRESENTING COMPLAINT: Dizziness.   DISCHARGE DIAGNOSES:  1.  Dizziness/ataxia.  2.  Hypertension.   CODE STATUS: No code, do not resuscitate.   MEDICATIONS: 1.  Pravastatin 20 mg at bedtime.  2.  Atenolol 25 mg p.o. daily.  3.  Warfarin 2 mg at bedtime.  4.  Paxil 20 mg daily.  5.  Meclizine 12.5 mg 3 times a day as needed.   DIET: Low sodium.   FOLLOWUP:  With Debroah Baller, MD, in 1 to 2 weeks.   DIAGNOSTIC DATA:   1.  CT angiography of the head shows atrophy and chronic ischemia; no acute infarct or mass lesion, mild atherosclerotic disease at the carotid bifurcation bilaterally without hemodynamically significant stenosis. No significant intracranial stenosis, hypoplastic right transverse sinus 2.  Echo Doppler of the heart shows EF of 70% to 75%, normal global left ventricular systolic function.  3.  MRI atrophy and chronic microvascular changes.  4.  Lipid profile within normal limits, except triglycerides of 320.  5.  CBC within normal limits.   Neurology consultation, Dr. Leotis Pain.   BRIEF SUMMARY OF HOSPITAL COURSE:  Christine Burnett is a 74 year old Caucasian female who came in with dizziness and ataxic gait. She was admitted with:  1.  Ataxia. Work-up so far is negative for CVA, could be vertigo. Trial of meclizine, and patient was asked to follow up with ENT as outpatient. CT neck was negative. She was continued on her Coumadin. She has been on it for a long time. She was also asked to discuss with her primary care physician if she can come off her Coumadin.  2.  Hypertension. Blood pressure under control with atenolol.  3.  Hyperlipidemia. Lipid profile appears stable. She was continued on her medications. Her triglycerides were 320.    4.  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 The carotid Doppler showed 50% blockage.   5.  Smoking cessation. Counseling done.  6.  Hospital stay otherwise remained stable.   CODE STATUS: The patient remained a no code, do not resuscitate.   TIME SPENT: Was 40 minutes.    ____________________________ Hart Rochester Posey Pronto, MD sap:nt D: 07/15/2014 14:30:31 ET T: 07/15/2014 22:03:26 ET JOB#: [id number]  cc: Pacen Watford A. Posey Pronto, MD, <Dictator> Ilda Basset MD ELECTRONICALLY SIGNED 07/23/2014 23:18

## 2014-10-04 NOTE — H&P (Signed)
PATIENT NAME:  Christine Burnett, Christine Burnett MR#:  [id number] DATE OF BIRTH:  [birth year]  DATE OF ADMISSION:  07/02/2014  PRIMARY CARE PHYSICIAN: Fara Olden B. Jacqualine Code, MD   REFERRING EMERGENCY ROOM PHYSICIAN: Algis Liming. Jimmye Norman, MD   CHIEF COMPLAINT: Loss of balance, leaning towards left side.   HISTORY OF PRESENTING ILLNESS: This is a 74 year old female who has a past history of hypertension, high cholesterol, and ataxic symptoms, possible TIAs in the past, who lives at home with her daughter and grandchildren. Says that for the last few months she had on-and-off symptoms of feeling ataxic and loss of balance, but that lasts for a few minutes and does not last long enough. Today she started feeling those symptoms again. She got up and tried to walk and she was just walking towards the left side, kept walking towards the left and bumping into walls, and also felt she would throw up, so she started crying and asked for help, and family brought her over here. In the ER, her CAT scan appeared to be negative, but because of these symptoms and ER physician. Tried to stand her up, she was leaning towards back, so he spoke to neurologist, and he suggested to admit her for possible TIA or stroke symptoms. On further questioning, she said that she had similar, but to a lesser extent, symptoms many years ago, almost 10 to 6, on Christmas Day, when she started feeling she was walking towards one side and bumping into the things and was feeling tearful and crying, but those symptoms did not last long. She came to Emergency Room and she was told that she had TIA. Again, in last Halloween, she had similar symptoms and she fell down at that time but did not seek any medical attention at that time. Today, she denies any headache, palpitation, but she felt her left side of the body was a little numb.   REVIEW OF SYSTEMS:  CONSTITUTIONAL: Negative for fever, fatigue, weakness, pain, or weight loss.  EYES: No blurry or double vision,  discharge, or redness.  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 Did not change any recently. No ear pain, hearing loss.  RESPIRATORY: No cough, wheezing, hemoptysis, or shortness of breath.  CARDIOVASCULAR: No chest pain, orthopnea, edema, arrhythmia, palpitation.   GASTROINTESTINAL: The patient had some nausea, but no vomiting, diarrhea, or abdominal pain.  GENITOURINARY: No dysuria, hematuria, or increased frequency.  ENDOCRINE: No heat or cold intolerance.  SKIN: No acne, rashes, or lesions.  MUSCULOSKELETAL: No pain or swelling in the joints.  NEUROLOGICAL: As mentioned above, some numbness on the left side of the body and some loss of balance and leaning towards the left side.   PAST MEDICAL HISTORY:  1.  Hypertension.  2.  Hyperlipidemia.  3.  TIA symptoms.  4.  Carotid artery blockages and was on medication for that. No surgery was done.   PAST SURGICAL HISTORY: None.   SOCIAL HISTORY: She smokes 5 to 10 cigarettes a day. Denies drinking alcohol or any illegal drug use. Lives with family and is steady on walking; does not require any support.   FAMILY HISTORY: Positive for cardiac issues in one of her brothers.   HOME MEDICATIONS:  1.  Warfarin 2 mg oral tablet once a day.  2.  Pravastatin 20 mg oral once a day.  3.  Paroxetine 20 mg oral once a day.  4.  Atenolol 25 mg once a day.  PHYSICAL EXAMINATION:  VITAL SIGNS: In ER, temperature 98.7, pulse is 58, respirations 18, blood pressure 154/77 and pulse oximetry is 97 on room air.  GENERAL: The patient is fully alert and oriented to time, place, and person. Does not appear in acute distress.  HEENT: Head and neck atraumatic. Conjunctivae are pink. Oral mucosa moist.  NECK: Supple. No JVD. Thyroid nontender.  RESPIRATORY: Bilateral equal and clear air entry. No wheezing or crepitation.  CARDIOVASCULAR: S1, S2 present, regular. No murmur.  ABDOMEN: Soft, nontender. Bowel  sounds present. No organomegaly.  SKIN: No acne, rashes, or lesions.  MUSCULOSKELETAL: No tenderness or swelling in the joints.  NEUROLOGICAL: Power 5/5. Follows commands. There is some loss of coordination in her left upper extremity and feels dizzy when standing up.  PSYCHIATRIC: Does not appear in any acute psychiatric illness. NEUROLOGIC: No tremor or rigidity.   IMPORTANT LABORATORY RESULTS: Glucose 92. BNP is 261. BUN 14, creatinine 1.20, sodium 140, potassium 4.0, chloride 108, CO2 is 27, anion gap is 5. Troponin less than 0.02. WBC 8.1, hemoglobin 14.6, platelet count 203,000 and MCV 101. INR is 1.9. CT scan of the head is done which mild diffuse cortical atrophy, mild chronic ischemic white matter disease. No acute intracranial abnormalities seen. Chest x-ray, portable, single view, shows negative exam.   ASSESSMENT AND PLAN: A 74 year old female with a history of hypertension and hyperlipidemia with some carotid blockages who was on Coumadin for that presented to Emergency Room with ataxic symptoms.  1.  Ataxia. Most likely this is acute cerebrovascular accident, as the patient has coordination problem, also, and has some ataxia. CAT scan of the head does not show anything, but it might be a posterior circulation stroke. We will get MRI of the brain to help better evaluation of this issue and get carotid Doppler study and echocardiogram and physical therapy evaluation. Neurologist is already aware. ER physician spoke to him, so we will wait for official consult. The patient was already taking Coumadin for her carotid issues, so I will leave it up to the neurologist whether to add antiplatelet agent or change it to something else.  2.  Hypertension. Blood pressure is under control with atenolol, so we will just continue the same.  3.  Hyperlipidemia. Check lipid panel.  4.  Carotid artery atherosclerosis. She said she had that and she is on Coumadin for that since many years. I would like to  have a carotid Doppler study done to find out about the blockages.  5.  Smoking. Smoking cessation counseling is done for 4 minutes and offered her nicotine patch or supplement. She refused to have that, but she would seriously consider about quitting the smoking habit.   TOTAL TIME SPENT ON THIS ADMISSION: 50 minutes.   ____________________________ Ceasar Lund Anselm Jungling, MD vgv:ST D: 07/02/2014 20:09:57 ET T: 07/02/2014 21:54:31 ET JOB#: [id number]  cc: Ceasar Lund. Anselm Jungling, MD, <Dictator> Milinda Pointer. Jacqualine Code, MD Rosalio Macadamia [facility] MD ELECTRONICALLY SIGNED 07/20/2014 9:58

## 2014-10-04 NOTE — Consult Note (Signed)
PATIENT NAME:  Christine Burnett, Christine Burnett MR#:  [id number] DATE OF BIRTH:  1940/10/27  DATE OF CONSULTATION:  07/03/2014  REFERRING PHYSICIAN:   CONSULTING PHYSICIAN:  Leotis Pain, MD  REASON FOR CONSULTATION: Gait imbalance.   HISTORY OF PRESENT ILLNESS: This is a 74 year old female with a past medical history of hypertension and elevated cholesterol, who presented with on and off periods of ataxic gait. The patient states that she had a similar episode years ago, as well as during the Christmas period. The patient states the ataxic gait/dizziness is positional, worse when standing up. The patient feels like she is leaning to the left side. For a number of months, the patient also has been complaining of tinnitus out of her left ear. Of note, the patient is on anticoagulation with Coumadin daily. Her INR is 1.9 on presentation. When asked the reason for anticoagulation, the patient states because of a clot in her carotids that was found about 27 years ago, and the patient has been on Coumadin for 22 years. In her chart, there is a history of atrial fibrillation, but neither the patient, nor the daughter at bedside, are aware of any atrial fibrillation history.   REVIEW OF SYSTEMS: Negative for fever, fatigue, chills. Positive for dizziness. Positive for tinnitus. No shortness of breath. No chest pain. No abdominal pain. No weakness on one side of the body compared to the other.   PAST MEDICAL HISTORY: Hypertension, hyperlipidemia, carotid artery blockages found 27 years ago; at that time, no surgery was done. The patient was started on anticoagulation.   SOCIAL HISTORY: Smoker of 5 to 10 cigarettes a day. He denies any EtOH use.   HOME MEDICATIONS: Warfarin daily, pravastatin, paroxetine, atenolol.   VITAL SIGNS: Have been reviewed.   IMAGING: Has been reviewed. The patient has a negative MRI for any acute intracranial pathology.   NEUROLOGICAL EVALUATION: Mental Status: The patient is awake,  alert and oriented to time, place, location, and the reason why she is in the hospital.  Cranial Nerves: Facial sensation intact. Facial motor is intact. Tongue is midline. Uvula elevates symmetrically. Shoulder shrug intact. Motor: Strength appears to be 5/5 bilateral upper and lower extremities. Sensation: Intact to light touch and temperature. Coordination: Finger-to-nose intact. Gait: Intact.   IMPRESSION: A 74 year old female with a past medical history of hypertension, hyperlipidemia, with questionable carotid blockages about 27 years ago, started on anticoagulation with Coumadin, which she takes daily. Ataxic gait; it sounds like an inner ear problem. The patient also has tinnitus, and this ataxia/dizziness is positional in nature.   PLAN: I did order CTA head and neck to look at her carotids, as well as posterior circulation, to make sure we do not have any signs of vertebrobasilar insufficiency. There is a questionable history of atrial fibrillation; but both daughter and patient deny there being history of. I am not convinced Coumadin is an appropriate choice for this patient at this time. Would follow up with cardiology as an outpatient, and obtain possible Holter monitor to rule out any cardiac arrhythmia. If there is no sign of cardiac arrhythmia, would start the patient on antiplatelet therapy. If there is cardiac arrhythmia, would consider newer anticoagulants, as the patient does not have a valve replacement.  P.r.n. meclizine on discharge. I believe this patient likely will be discharged later today.   This case was discussed with primary team and the patient's family at bedside.   Also, significant time was spent on discussing smoking cessation.    ____________________________  Leotis Pain, MD yz:MT D: 07/03/2014 12:37:07 ET T: 07/03/2014 13:15:20 ET JOB#: [id number]  cc: Leotis Pain, MD, <Dictator> Leotis Pain MD ELECTRONICALLY SIGNED 07/14/2014 12:06

## 2014-10-07 DIAGNOSIS — Z5181 Encounter for therapeutic drug level monitoring: Secondary | ICD-10-CM | POA: Diagnosis not present

## 2014-10-07 DIAGNOSIS — Z7901 Long term (current) use of anticoagulants: Secondary | ICD-10-CM | POA: Diagnosis not present

## 2014-10-07 DIAGNOSIS — F419 Anxiety disorder, unspecified: Secondary | ICD-10-CM | POA: Diagnosis not present

## 2014-11-16 ENCOUNTER — Telehealth: Payer: Self-pay | Admitting: Family Medicine

## 2014-11-16 DIAGNOSIS — Z5181 Encounter for therapeutic drug level monitoring: Secondary | ICD-10-CM

## 2014-11-16 DIAGNOSIS — Z7901 Long term (current) use of anticoagulants: Secondary | ICD-10-CM | POA: Diagnosis not present

## 2014-11-16 NOTE — Telephone Encounter (Signed)
PT CAME AND PICKED UP LAB REQ BUT IS NEEDING A STANDING ORDER FOR HER INR FROM HERE AFTER.

## 2014-11-16 NOTE — Telephone Encounter (Signed)
Pt is going to [facility] today to get her PT/INR please send order. Patient will be stopping by to pick it up

## 2014-11-16 NOTE — Telephone Encounter (Signed)
Standing order for INR once every month entered into pt.'s chart.

## 2014-11-17 LAB — PROTIME-INR
INR: 2 — ABNORMAL HIGH (ref 0.8–1.2)
PROTHROMBIN TIME: 21 s — AB (ref 9.1–12.0)

## 2014-11-18 NOTE — Telephone Encounter (Signed)
11-18-14- PER ASHLEY CAN CLOSE FOR THIS HAS BEEN DONE.

## 2015-01-05 ENCOUNTER — Encounter: Payer: Self-pay | Admitting: Family Medicine

## 2015-01-05 ENCOUNTER — Ambulatory Visit (INDEPENDENT_AMBULATORY_CARE_PROVIDER_SITE_OTHER): Payer: Medicare Other | Admitting: Family Medicine

## 2015-01-05 VITALS — BP 112/68 | HR 111 | Temp 97.0°F | Resp 19 | Ht 66.0 in | Wt 146.1 lb

## 2015-01-05 DIAGNOSIS — Z7901 Long term (current) use of anticoagulants: Secondary | ICD-10-CM | POA: Diagnosis not present

## 2015-01-05 DIAGNOSIS — E785 Hyperlipidemia, unspecified: Secondary | ICD-10-CM

## 2015-01-05 DIAGNOSIS — Z5181 Encounter for therapeutic drug level monitoring: Secondary | ICD-10-CM | POA: Diagnosis not present

## 2015-01-05 DIAGNOSIS — F419 Anxiety disorder, unspecified: Secondary | ICD-10-CM

## 2015-01-05 MED ORDER — WARFARIN SODIUM 2 MG PO TABS: 2.0000 mg | ORAL_TABLET | Freq: Every day | ORAL | Status: DC

## 2015-01-05 MED ORDER — PAROXETINE HCL 20 MG PO TABS: 20.0000 mg | ORAL_TABLET | Freq: Every day | ORAL | Status: DC

## 2015-01-05 NOTE — Progress Notes (Signed)
Name: Christine Burnett   MRN: [id number]    DOB: 12-23-1940   Date:01/05/2015       Progress Note  Subjective  Chief Complaint  Chief Complaint  Patient presents with  . Follow-up    3 mo  . Anxiety  . Hypertension  . Medication Refill    Hyperlipidemia This is a chronic problem. Recent lipid tests were reviewed and are normal (elevated TG, low HDL). Pertinent negatives include no chest pain, leg pain, myalgias or shortness of breath. She is currently on no antihyperlipidemic treatment (Stopped taking Lipitor due to groin pain and the pain resolved.). Compliance problems include medication side effects.   Anxiety Presents for follow-up visit. Symptoms include nervous/anxious behavior. Patient reports no chest pain, depressed mood, excessive worry, irritability or shortness of breath.   Past treatments include SSRIs and benzodiazephines. Compliance with prior treatments has been good.  Anticoagulation Pt. Is on Coumadin 2 mg daily for anti-coagulation due to history of a TIA and per Neurology recommendation, she should continue on Coumadin therapy. Last INR obtained in June 2016 was therapeutic. No bleeding episodes reported.  Past Medical History  Diagnosis Date  . Hypertension   . A-fib   . CKD (chronic kidney disease), stage III   . Cornea scar   . Anxiety   . Risk for falls   . Situational disturbance   . Diverticulosis   . Chronic bronchitis   . Allergic rhinitis   . Osteoporosis   . TIA (transient ischemic attack)   . Hyperlipidemia     Past Surgical History  Procedure Laterality Date  . Esophagus surgery      Family History  Problem Relation Age of Onset  . Hypertension Mother   . Heart attack Father     History   Social History  . Marital Status: Divorced    Spouse Name: N/A  . Number of Children: N/A  . Years of Education: N/A   Occupational History  . Not on file.   Social History Main Topics  . Smoking status: Current Every Day Smoker -- 0.25  packs/day for 61 years    Types: Cigarettes  . Smokeless tobacco: Not on file  . Alcohol Use: No  . Drug Use: No  . Sexual Activity: Not on file   Other Topics Concern  . Not on file   Social History Narrative     Current outpatient prescriptions:  .  ALPRAZolam (XANAX) 0.25 MG tablet, Take 0.25 mg by mouth at bedtime as needed for anxiety., Disp: , Rfl:  .  atenolol (TENORMIN) 25 MG tablet, Take 12.5 mg by mouth daily., Disp: , Rfl:  .  PARoxetine (PAXIL) 20 MG tablet, Take 20 mg by mouth daily., Disp: , Rfl:  .  warfarin (COUMADIN) 2 MG tablet, Take 2 mg by mouth as directed. , Disp: , Rfl:   Allergies  Allergen Reactions  . Cefuroxime Axetil     caused rash.  . Ciprofloxacin     caused rash.  . Codeine      Review of Systems  Constitutional: Negative for irritability.  Respiratory: Negative for shortness of breath.   Cardiovascular: Negative for chest pain.  Musculoskeletal: Negative for myalgias and neck pain.  Endo/Heme/Allergies: Bruises/bleeds easily.  Psychiatric/Behavioral: The patient is nervous/anxious.       Objective  Filed Vitals:   01/05/15 1432  BP: 112/68  Pulse: 111  Temp: 97 F (36.1 C)  TempSrc: Oral  Resp: 19  Height: '5\' 6"'$  (1.676 m)  Weight: 146 lb 1.6 oz (66.271 kg)  SpO2: 94%    Physical Exam  Constitutional: She is oriented to person, place, and time and well-developed, well-nourished, and in no distress.  HENT:  Head: Normocephalic.  Cardiovascular: Normal rate and regular rhythm.   Pulmonary/Chest: Effort normal and breath sounds normal.  Neurological: She is alert and oriented to person, place, and time.  Skin: Skin is warm and dry.  Psychiatric: Memory, affect and judgment normal.  Nursing note and vitals reviewed.    Recent Results (from the past 2160 hour(s))  INR/PT     Status: Abnormal   Collection Time: 11/16/14  2:21 PM  Result Value Ref Range   INR 2.0 (H) 0.8 - 1.2    Comment: Reference interval is for  non-anticoagulated patients. Suggested INR therapeutic range for Vitamin K antagonist therapy:    Standard Dose (moderate intensity                   therapeutic range):       2.0 - 3.0    Higher intensity therapeutic range       2.5 - 3.5    Prothrombin Time 21.0 (H) 9.1 - 12.0 sec     Assessment & Plan 1. Anticoagulation goal of INR 2 to 3 Recheck INR. Patient has a standing order to obtain INR every month. - warfarin (COUMADIN) 2 MG tablet; Take 1 tablet (2 mg total) by mouth daily at 6 PM.  Dispense: 90 tablet; Refill: 3 - INR/PT - INR/PT  2. Anxiety  - PARoxetine (PAXIL) 20 MG tablet; Take 1 tablet (20 mg total) by mouth daily.  Dispense: 90 tablet; Refill: 0  3. Hyperlipidemia Patient has stopped taking Lipitor. Recheck  FLP and liver enzymes today and follow-up. - Lipid Profile - Comprehensive metabolic panel    Estrella Alcaraz [facility] 01/05/2015 3:03 PM

## 2015-01-06 DIAGNOSIS — Z5181 Encounter for therapeutic drug level monitoring: Secondary | ICD-10-CM | POA: Diagnosis not present

## 2015-01-06 DIAGNOSIS — E785 Hyperlipidemia, unspecified: Secondary | ICD-10-CM | POA: Diagnosis not present

## 2015-01-06 DIAGNOSIS — Z7901 Long term (current) use of anticoagulants: Secondary | ICD-10-CM | POA: Diagnosis not present

## 2015-01-07 ENCOUNTER — Other Ambulatory Visit: Payer: Self-pay | Admitting: Family Medicine

## 2015-01-07 DIAGNOSIS — Z7901 Long term (current) use of anticoagulants: Principal | ICD-10-CM

## 2015-01-07 DIAGNOSIS — Z5181 Encounter for therapeutic drug level monitoring: Secondary | ICD-10-CM

## 2015-01-07 LAB — COMPREHENSIVE METABOLIC PANEL
ALBUMIN: 4.2 g/dL (ref 3.5–4.8)
ALK PHOS: 99 IU/L (ref 39–117)
ALT: 18 IU/L (ref 0–32)
AST: 18 IU/L (ref 0–40)
Albumin/Globulin Ratio: 1.5 (ref 1.1–2.5)
BILIRUBIN TOTAL: 0.4 mg/dL (ref 0.0–1.2)
BUN / CREAT RATIO: 14 (ref 11–26)
BUN: 19 mg/dL (ref 8–27)
CO2: 23 mmol/L (ref 18–29)
CREATININE: 1.33 mg/dL — AB (ref 0.57–1.00)
Calcium: 9.8 mg/dL (ref 8.7–10.3)
Chloride: 102 mmol/L (ref 97–108)
GFR calc non Af Amer: 39 mL/min/{1.73_m2} — ABNORMAL LOW (ref >59)
GFR, EST AFRICAN AMERICAN: 45 mL/min/{1.73_m2} — AB (ref >59)
GLOBULIN, TOTAL: 2.8 g/dL (ref 1.5–4.5)
Glucose: 104 mg/dL — ABNORMAL HIGH (ref 65–99)
Potassium: 5.1 mmol/L (ref 3.5–5.2)
Sodium: 140 mmol/L (ref 134–144)
Total Protein: 7 g/dL (ref 6.0–8.5)

## 2015-01-07 LAB — PROTIME-INR
INR: 1.8 — AB (ref 0.8–1.2)
Prothrombin Time: 18.2 s — ABNORMAL HIGH (ref 9.1–12.0)

## 2015-01-07 LAB — LIPID PANEL
CHOL/HDL RATIO: 5.7 ratio — AB (ref 0.0–4.4)
Cholesterol, Total: 221 mg/dL — ABNORMAL HIGH (ref 100–199)
HDL: 39 mg/dL — ABNORMAL LOW (ref >39)
LDL Calculated: 139 mg/dL — ABNORMAL HIGH (ref 0–99)
Triglycerides: 216 mg/dL — ABNORMAL HIGH (ref 0–149)
VLDL CHOLESTEROL CAL: 43 mg/dL — AB (ref 5–40)

## 2015-01-08 ENCOUNTER — Ambulatory Visit: Payer: Self-pay | Admitting: Family Medicine

## 2015-01-11 ENCOUNTER — Other Ambulatory Visit: Payer: Self-pay | Admitting: Family Medicine

## 2015-01-11 DIAGNOSIS — Z5181 Encounter for therapeutic drug level monitoring: Secondary | ICD-10-CM | POA: Diagnosis not present

## 2015-01-12 LAB — PROTIME-INR
INR: 2.3 — ABNORMAL HIGH (ref 0.8–1.2)
Prothrombin Time: 23.2 s — ABNORMAL HIGH (ref 9.1–12.0)

## 2015-01-14 ENCOUNTER — Ambulatory Visit (INDEPENDENT_AMBULATORY_CARE_PROVIDER_SITE_OTHER): Payer: Medicare Other | Admitting: Family Medicine

## 2015-01-14 ENCOUNTER — Ambulatory Visit: Payer: Medicare Other | Admitting: Family Medicine

## 2015-01-14 ENCOUNTER — Encounter: Payer: Self-pay | Admitting: Family Medicine

## 2015-01-14 VITALS — BP 113/60 | HR 107 | Temp 98.1°F | Resp 20 | Ht 66.0 in | Wt 147.0 lb

## 2015-01-14 DIAGNOSIS — R5383 Other fatigue: Secondary | ICD-10-CM | POA: Diagnosis not present

## 2015-01-14 DIAGNOSIS — E785 Hyperlipidemia, unspecified: Secondary | ICD-10-CM | POA: Diagnosis not present

## 2015-01-14 DIAGNOSIS — R102 Pelvic and perineal pain: Secondary | ICD-10-CM | POA: Insufficient documentation

## 2015-01-14 DIAGNOSIS — N9489 Other specified conditions associated with female genital organs and menstrual cycle: Secondary | ICD-10-CM

## 2015-01-14 DIAGNOSIS — N858 Other specified noninflammatory disorders of uterus: Secondary | ICD-10-CM | POA: Insufficient documentation

## 2015-01-14 MED ORDER — ROSUVASTATIN CALCIUM 5 MG PO TABS: 5.0000 mg | ORAL_TABLET | Freq: Every day | ORAL | Status: DC

## 2015-01-14 NOTE — Progress Notes (Signed)
Name: Christine Burnett   MRN: [id number]    DOB: 1940/10/02   Date:01/14/2015       Progress Note  Subjective  Chief Complaint  Chief Complaint  Patient presents with  . Follow-up    Discuss/Start statin therapy  . Hyperlipidemia  . Anxiety  . Hypertension    Hyperlipidemia This is a chronic problem. The problem is uncontrolled. Recent lipid tests were reviewed and are high. Pertinent negatives include no chest pain, myalgias (had muscle aches with Lipitor, which was discontinued.) or shortness of breath. Current antihyperlipidemic treatment includes statins. Compliance problems include medication side effects.   Fatigue Pt. Is here for evaluation of fatigue and staying tired all the time. She sleeps over 12 hours a day and still feels like she needs to sleep more. She is wondering if she may be anemic or deficient in Vitamin D. She is otherwise in good health.    Past Medical History  Diagnosis Date  . Hypertension   . A-fib   . CKD (chronic kidney disease), stage III   . Cornea scar   . Anxiety   . Risk for falls   . Situational disturbance   . Diverticulosis   . Chronic bronchitis   . Allergic rhinitis   . Osteoporosis   . TIA (transient ischemic attack)   . Hyperlipidemia     Past Surgical History  Procedure Laterality Date  . Esophagus surgery      Family History  Problem Relation Age of Onset  . Hypertension Mother   . Heart attack Father     Social History   Social History  . Marital Status: Divorced    Spouse Name: N/A  . Number of Children: N/A  . Years of Education: N/A   Occupational History  . Not on file.   Social History Main Topics  . Smoking status: Current Every Day Smoker -- 0.25 packs/day for 61 years    Types: Cigarettes  . Smokeless tobacco: Not on file  . Alcohol Use: No  . Drug Use: No  . Sexual Activity: Not on file   Other Topics Concern  . Not on file   Social History Narrative     Current outpatient prescriptions:   .  ALPRAZolam (XANAX) 0.25 MG tablet, Take 0.25 mg by mouth at bedtime as needed for anxiety., Disp: , Rfl:  .  atenolol (TENORMIN) 25 MG tablet, Take 12.5 mg by mouth daily., Disp: , Rfl:  .  PARoxetine (PAXIL) 20 MG tablet, Take 1 tablet (20 mg total) by mouth daily., Disp: 90 tablet, Rfl: 0 .  warfarin (COUMADIN) 2 MG tablet, Take 1 tablet (2 mg total) by mouth daily at 6 PM., Disp: 90 tablet, Rfl: 3  Allergies  Allergen Reactions  . Cefuroxime Axetil     caused rash.  . Ciprofloxacin     caused rash.  . Codeine      Review of Systems  Constitutional: Positive for malaise/fatigue. Negative for fever, chills and weight loss.  Respiratory: Negative for shortness of breath.   Cardiovascular: Negative for chest pain.  Gastrointestinal: Positive for blood in stool (pt. has intermittent bleeding hemorrhoids.). Negative for abdominal pain and melena.  Musculoskeletal: Negative for myalgias (had muscle aches with Lipitor, which was discontinued.).  Neurological: Negative for dizziness.      Objective  Filed Vitals:   01/14/15 1405  BP: 113/60  Pulse: 107  Temp: 98.1 F (36.7 C)  TempSrc: Oral  Resp: 20  Height: '5\' 6"'$  (1.676  m)  Weight: 147 lb (66.679 kg)  SpO2: 96%    Physical Exam  Constitutional: She is oriented to person, place, and time and well-developed, well-nourished, and in no distress.  Cardiovascular: Normal rate and regular rhythm.   Pulmonary/Chest: Effort normal and breath sounds normal.  Abdominal: Soft. Bowel sounds are normal. There is tenderness in the suprapubic area.  Neurological: She is alert and oriented to person, place, and time.  Psychiatric: Memory, affect and judgment normal.  Nursing note and vitals reviewed.    Assessment & Plan  1. Hyperlipidemia Started patient on Crestor 5 mg at bedtime for hyperlipidemia.  - rosuvastatin (CRESTOR) 5 MG tablet; Take 1 tablet (5 mg total) by mouth daily at 6 PM.  Dispense: 90 tablet; Refill: 0  2.  Pelvic pressure in female  - US Transvaginal Non-OB; Future - US Pelvis Complete; Future  3. Other fatigue Testing for Vitamin D and B12 levels not covered by her insurance.we will obtain CBC and TSH and follow-up. Patient advised to start taking OTC vitamin D. - CBC with Differential - TSH   Zeinab Rodwell [facility] 01/14/2015 2:13 PM

## 2015-01-15 DIAGNOSIS — R5383 Other fatigue: Secondary | ICD-10-CM | POA: Diagnosis not present

## 2015-01-16 LAB — CBC WITH DIFFERENTIAL/PLATELET
BASOS ABS: 0 10*3/uL (ref 0.0–0.2)
Basos: 0 %
EOS (ABSOLUTE): 0.3 10*3/uL (ref 0.0–0.4)
EOS: 4 %
HEMATOCRIT: 38.9 % (ref 34.0–46.6)
HEMOGLOBIN: 13.7 g/dL (ref 11.1–15.9)
Immature Grans (Abs): 0 10*3/uL (ref 0.0–0.1)
Immature Granulocytes: 0 %
LYMPHS: 31 %
Lymphocytes Absolute: 2.9 10*3/uL (ref 0.7–3.1)
MCH: 34.8 pg — ABNORMAL HIGH (ref 26.6–33.0)
MCHC: 35.2 g/dL (ref 31.5–35.7)
MCV: 99 fL — AB (ref 79–97)
Monocytes Absolute: 0.7 10*3/uL (ref 0.1–0.9)
Monocytes: 7 %
Neutrophils Absolute: 5.4 10*3/uL (ref 1.4–7.0)
Neutrophils: 58 %
Platelets: 278 10*3/uL (ref 150–379)
RBC: 3.94 x10E6/uL (ref 3.77–5.28)
RDW: 13.8 % (ref 12.3–15.4)
WBC: 9.3 10*3/uL (ref 3.4–10.8)

## 2015-01-16 LAB — TSH: TSH: 1.69 u[IU]/mL (ref 0.450–4.500)

## 2015-01-22 ENCOUNTER — Ambulatory Visit
Admission: RE | Admit: 2015-01-22 | Discharge: 2015-01-22 | Disposition: A | Discharge status: Home / Self Care | Payer: Medicare Other | Source: Ambulatory Visit | Attending: Family Medicine | Admitting: Family Medicine

## 2015-01-22 DIAGNOSIS — N9489 Other specified conditions associated with female genital organs and menstrual cycle: Secondary | ICD-10-CM | POA: Diagnosis not present

## 2015-01-22 DIAGNOSIS — R10819 Abdominal tenderness, unspecified site: Secondary | ICD-10-CM | POA: Diagnosis not present

## 2015-01-22 DIAGNOSIS — R102 Pelvic and perineal pain: Secondary | ICD-10-CM

## 2015-02-09 DIAGNOSIS — Z7901 Long term (current) use of anticoagulants: Secondary | ICD-10-CM | POA: Diagnosis not present

## 2015-02-09 DIAGNOSIS — Z5181 Encounter for therapeutic drug level monitoring: Secondary | ICD-10-CM | POA: Diagnosis not present

## 2015-02-10 ENCOUNTER — Other Ambulatory Visit: Payer: Self-pay | Admitting: Gastroenterology

## 2015-02-10 DIAGNOSIS — R1030 Lower abdominal pain, unspecified: Secondary | ICD-10-CM | POA: Diagnosis not present

## 2015-02-10 DIAGNOSIS — Z7901 Long term (current) use of anticoagulants: Secondary | ICD-10-CM | POA: Insufficient documentation

## 2015-02-10 DIAGNOSIS — R109 Unspecified abdominal pain: Secondary | ICD-10-CM | POA: Insufficient documentation

## 2015-02-10 DIAGNOSIS — R197 Diarrhea, unspecified: Secondary | ICD-10-CM | POA: Diagnosis not present

## 2015-02-10 DIAGNOSIS — I129 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 stage 4 chronic kidney disease, or unspecified chronic kidney disease: Secondary | ICD-10-CM | POA: Insufficient documentation

## 2015-02-10 LAB — PROTIME-INR
INR: 1.9 — ABNORMAL HIGH (ref 0.8–1.2)
Prothrombin Time: 19.1 s — ABNORMAL HIGH (ref 9.1–12.0)

## 2015-02-15 ENCOUNTER — Ambulatory Visit
Admission: RE | Admit: 2015-02-15 | Discharge: 2015-02-15 | Disposition: A | Discharge status: Home / Self Care | Payer: Medicare Other | Source: Ambulatory Visit | Attending: Gastroenterology | Admitting: Gastroenterology

## 2015-02-15 DIAGNOSIS — R1030 Lower abdominal pain, unspecified: Secondary | ICD-10-CM | POA: Diagnosis not present

## 2015-02-15 DIAGNOSIS — R197 Diarrhea, unspecified: Secondary | ICD-10-CM | POA: Insufficient documentation

## 2015-02-15 DIAGNOSIS — R109 Unspecified abdominal pain: Secondary | ICD-10-CM | POA: Diagnosis not present

## 2015-02-15 MED ORDER — IOHEXOL 300 MG/ML  SOLN
80.0000 mL | Freq: Once | INTRAMUSCULAR | Status: AC | PRN
  Administered 2015-02-15: 80 mL via INTRAVENOUS

## 2015-03-16 ENCOUNTER — Ambulatory Visit: Payer: Medicare Other

## 2015-03-16 ENCOUNTER — Telehealth: Payer: Self-pay | Admitting: Family Medicine

## 2015-03-16 NOTE — Telephone Encounter (Signed)
ERRENOUS °

## 2015-03-16 NOTE — Telephone Encounter (Signed)
Routed to Dr. Shah °

## 2015-03-17 NOTE — Telephone Encounter (Signed)
INR is therapeutic at  2.2. Continue present dosage of Coumadin. Recheck in one month.

## 2015-03-24 DIAGNOSIS — K589 Irritable bowel syndrome without diarrhea: Secondary | ICD-10-CM | POA: Diagnosis not present

## 2015-03-24 NOTE — Telephone Encounter (Signed)
ERRENOUS °

## 2015-04-07 ENCOUNTER — Ambulatory Visit (INDEPENDENT_AMBULATORY_CARE_PROVIDER_SITE_OTHER): Payer: Medicare Other | Admitting: Family Medicine

## 2015-04-07 ENCOUNTER — Encounter: Payer: Self-pay | Admitting: Family Medicine

## 2015-04-07 VITALS — BP 118/60 | HR 100 | Temp 97.6°F | Resp 14 | Ht 66.0 in | Wt 144.9 lb

## 2015-04-07 DIAGNOSIS — Z7901 Long term (current) use of anticoagulants: Secondary | ICD-10-CM | POA: Diagnosis not present

## 2015-04-07 DIAGNOSIS — I10 Essential (primary) hypertension: Secondary | ICD-10-CM

## 2015-04-07 DIAGNOSIS — R011 Cardiac murmur, unspecified: Secondary | ICD-10-CM | POA: Diagnosis not present

## 2015-04-07 DIAGNOSIS — F419 Anxiety disorder, unspecified: Secondary | ICD-10-CM | POA: Diagnosis not present

## 2015-04-07 DIAGNOSIS — E785 Hyperlipidemia, unspecified: Secondary | ICD-10-CM | POA: Diagnosis not present

## 2015-04-07 DIAGNOSIS — Z23 Encounter for immunization: Secondary | ICD-10-CM | POA: Diagnosis not present

## 2015-04-07 DIAGNOSIS — Z5181 Encounter for therapeutic drug level monitoring: Secondary | ICD-10-CM

## 2015-04-07 LAB — POCT INR: INR: 2.1

## 2015-04-07 MED ORDER — ROSUVASTATIN CALCIUM 5 MG PO TABS: 5.0000 mg | ORAL_TABLET | Freq: Every day | ORAL | Status: DC

## 2015-04-07 MED ORDER — ALPRAZOLAM 0.25 MG PO TABS: 0.2500 mg | ORAL_TABLET | Freq: Every evening | ORAL | Status: DC | PRN

## 2015-04-07 MED ORDER — PAROXETINE HCL 20 MG PO TABS: 20.0000 mg | ORAL_TABLET | Freq: Every day | ORAL | Status: DC

## 2015-04-07 MED ORDER — ATENOLOL 25 MG PO TABS: 12.5000 mg | ORAL_TABLET | Freq: Every day | ORAL | Status: DC

## 2015-04-07 NOTE — Progress Notes (Signed)
Name: Christine Burnett   MRN: [id number]    DOB: [birth year]   Date:04/07/2015       Progress Note  Subjective  Chief Complaint  Chief Complaint  Patient presents with  . Hypertension    3 month follow up  . Hyperlipidemia  . Anxiety    Hypertension This is a chronic problem. The problem is controlled. Associated symptoms include anxiety. Pertinent negatives include no chest pain, headaches, palpitations or shortness of breath. Past treatments include beta blockers. Hypertensive end-organ damage includes CVA (TIA.). There is no history of kidney disease or CAD/MI.  Hyperlipidemia This is a chronic problem. Recent lipid tests were reviewed and are high. Pertinent negatives include no chest pain, leg pain, myalgias or shortness of breath. Current antihyperlipidemic treatment includes statins.  Anxiety Presents for follow-up visit. Symptoms include excessive worry and nervous/anxious behavior. Patient reports no chest pain, palpitations or shortness of breath. The severity of symptoms is moderate. The symptoms are aggravated by family issues (daughter and her 4 kids live with her).     Past Medical History  Diagnosis Date  . Hypertension   . A-fib ([place])   . CKD (chronic kidney disease), stage III   . Cornea scar   . Anxiety   . Risk for falls   . Situational disturbance   . Diverticulosis   . Chronic bronchitis ([place])   . Allergic rhinitis   . Osteoporosis   . TIA (transient ischemic attack)   . Hyperlipidemia     Past Surgical History  Procedure Laterality Date  . Esophagus surgery      Family History  Problem Relation Age of Onset  . Hypertension Mother   . Heart attack Father     Social History   Social History  . Marital Status: Divorced    Spouse Name: N/A  . Number of Children: N/A  . Years of Education: N/A   Occupational History  . Not on file.   Social History Main Topics  . Smoking status: Current Every Day Smoker -- 0.25 packs/day for 61 years   Types: Cigarettes  . Smokeless tobacco: Not on file  . Alcohol Use: No  . Drug Use: No  . Sexual Activity: Not on file   Other Topics Concern  . Not on file   Social History Narrative     Current outpatient prescriptions:  .  ALPRAZolam (XANAX) 0.25 MG tablet, Take 0.25 mg by mouth at bedtime as needed for anxiety., Disp: , Rfl:  .  atenolol (TENORMIN) 25 MG tablet, Take 0.5 tablets (12.5 mg total) by mouth daily., Disp: 90 tablet, Rfl: 0 .  PARoxetine (PAXIL) 20 MG tablet, Take 1 tablet (20 mg total) by mouth daily., Disp: 90 tablet, Rfl: 0 .  rosuvastatin (CRESTOR) 5 MG tablet, Take 1 tablet (5 mg total) by mouth daily at 6 PM., Disp: 30 tablet, Rfl: 2 .  warfarin (COUMADIN) 2 MG tablet, Take 1 tablet (2 mg total) by mouth daily at 6 PM., Disp: 90 tablet, Rfl: 3  Allergies  Allergen Reactions  . Cefuroxime Axetil     caused rash.  . Ciprofloxacin     caused rash.  . Codeine    Review of Systems  Respiratory: Negative for shortness of breath.   Cardiovascular: Negative for chest pain and palpitations.  Musculoskeletal: Negative for myalgias.  Neurological: Negative for headaches.  Psychiatric/Behavioral: Positive for depression. The patient is nervous/anxious.     Objective  Filed Vitals:   04/07/15 1149  BP:  118/60  Pulse: 100  Temp: 97.6 F (36.4 C)  TempSrc: Oral  Resp: 14  Height: '5\' 6"'$  (1.676 m)  Weight: 144 lb 14.4 oz (65.726 kg)  SpO2: 94%    Physical Exam  Constitutional: She is oriented to person, place, and time and well-developed, well-nourished, and in no distress.  Cardiovascular: Normal rate and regular rhythm.   Murmur heard. Pulmonary/Chest: Effort normal and breath sounds normal. She has no wheezes. She has no rales.  Abdominal: Soft. Bowel sounds are normal.  Neurological: She is alert and oriented to person, place, and time.  Psychiatric: Memory, affect and judgment normal.  Nursing note and vitals reviewed.   Assessment & Plan  1.  Hyperlipidemia Patient reports no side effects with Crestor. Recheck lipid panel today. - rosuvastatin (CRESTOR) 5 MG tablet; Take 1 tablet (5 mg total) by mouth daily at 6 PM.  Dispense: 30 tablet; Refill: 2 - Lipid Profile - Comprehensive Metabolic Panel (CMET)  2. Anxiety Symptoms of anxiety are stable on alprazolam as needed and Paxil. Refills provided. - PARoxetine (PAXIL) 20 MG tablet; Take 1 tablet (20 mg total) by mouth daily.  Dispense: 90 tablet; Refill: 0 - ALPRAZolam (XANAX) 0.25 MG tablet; Take 1 tablet (0.25 mg total) by mouth at bedtime as needed for anxiety.  Dispense: 30 tablet; Refill: 2  3. Anticoagulation goal of INR 2 to 3 INR 2.1, considered therapeutic. - POCT INR  4. Essential hypertension Blood pressure at goal on present therapy.  5. Need for influenza vaccination  - Flu vaccine HIGH DOSE PF (Fluzone High dose)  6. Cardiac murmur Patient advised to follow up with cardiology.will likely need an echocardiogram.   Chrstopher Malenfant [facility] 04/07/2015 12:17 PM

## 2015-05-17 ENCOUNTER — Encounter: Payer: Self-pay | Admitting: Family Medicine

## 2015-05-17 ENCOUNTER — Ambulatory Visit (INDEPENDENT_AMBULATORY_CARE_PROVIDER_SITE_OTHER): Payer: Medicare Other | Admitting: Family Medicine

## 2015-05-17 ENCOUNTER — Ambulatory Visit: Payer: Medicare Other

## 2015-05-17 VITALS — BP 118/77 | HR 79 | Temp 98.1°F | Resp 17 | Ht 66.0 in | Wt 147.0 lb

## 2015-05-17 DIAGNOSIS — J209 Acute bronchitis, unspecified: Secondary | ICD-10-CM

## 2015-05-17 DIAGNOSIS — Z7901 Long term (current) use of anticoagulants: Secondary | ICD-10-CM

## 2015-05-17 DIAGNOSIS — J40 Bronchitis, not specified as acute or chronic: Secondary | ICD-10-CM | POA: Insufficient documentation

## 2015-05-17 LAB — POCT INR: INR: 2.3

## 2015-05-17 MED ORDER — AZITHROMYCIN 250 MG PO TABS: ORAL_TABLET | ORAL | Status: DC

## 2015-05-17 MED ORDER — BENZONATATE 200 MG PO CAPS: 200.0000 mg | ORAL_CAPSULE | Freq: Three times a day (TID) | ORAL | Status: DC | PRN

## 2015-05-17 NOTE — Progress Notes (Signed)
Name: Christine Burnett   MRN: [id number]    DOB: 12-13-1940   Date:05/17/2015       Progress Note  Subjective  Chief Complaint  Chief Complaint  Patient presents with  . Acute Visit    Congestion    Cough This is a new problem. Episode onset: 3 weeks ago. The cough is productive of sputum. Associated symptoms include nasal congestion (although has improved) and shortness of breath. Pertinent negatives include no chest pain, chills, ear pain, fever, headaches, sore throat or weight loss. She has tried nothing for the symptoms.   Past Medical History  Diagnosis Date  . Hypertension   . A-fib ([place])   . CKD (chronic kidney disease), stage III   . Cornea scar   . Anxiety   . Risk for falls   . Situational disturbance   . Diverticulosis   . Chronic bronchitis ([place])   . Allergic rhinitis   . Osteoporosis   . TIA (transient ischemic attack)   . Hyperlipidemia     Past Surgical History  Procedure Laterality Date  . Esophagus surgery      Family History  Problem Relation Age of Onset  . Hypertension Mother   . Heart attack Father     Social History   Social History  . Marital Status: Divorced    Spouse Name: N/A  . Number of Children: N/A  . Years of Education: N/A   Occupational History  . Not on file.   Social History Main Topics  . Smoking status: Current Every Day Smoker -- 0.25 packs/day for 61 years    Types: Cigarettes  . Smokeless tobacco: Not on file  . Alcohol Use: No  . Drug Use: No  . Sexual Activity: Not on file   Other Topics Concern  . Not on file   Social History Narrative     Current outpatient prescriptions:  .  ALPRAZolam (XANAX) 0.25 MG tablet, Take 1 tablet (0.25 mg total) by mouth at bedtime as needed for anxiety., Disp: 30 tablet, Rfl: 2 .  atenolol (TENORMIN) 25 MG tablet, Take 0.5 tablets (12.5 mg total) by mouth daily., Disp: 90 tablet, Rfl: 0 .  PARoxetine (PAXIL) 20 MG tablet, Take 1 tablet (20 mg total) by mouth daily.,  Disp: 90 tablet, Rfl: 0 .  rosuvastatin (CRESTOR) 5 MG tablet, Take 1 tablet (5 mg total) by mouth daily at 6 PM., Disp: 30 tablet, Rfl: 2 .  warfarin (COUMADIN) 2 MG tablet, Take 1 tablet (2 mg total) by mouth daily at 6 PM., Disp: 90 tablet, Rfl: 3  Allergies  Allergen Reactions  . Cefuroxime Axetil     caused rash.  . Ciprofloxacin     caused rash.  . Codeine    Review of Systems  Constitutional: Negative for fever, chills and weight loss.  HENT: Positive for congestion. Negative for ear pain and sore throat.   Respiratory: Positive for cough, sputum production and shortness of breath.   Cardiovascular: Negative for chest pain.  Neurological: Negative for headaches.     Objective  Filed Vitals:   05/17/15 1427  BP: 118/77  Pulse: 79  Temp: 98.1 F (36.7 C)  TempSrc: Oral  Resp: 17  Height: '5\' 6"'$  (1.676 m)  Weight: 147 lb (66.679 kg)  SpO2: 93%    Physical Exam  Constitutional: She is well-developed, well-nourished, and in no distress.  HENT:  Right Ear: Tympanic membrane and ear canal normal.  Left Ear: Tympanic membrane and ear canal  normal.  Mouth/Throat: Oropharynx is clear and moist and mucous membranes are normal. No posterior oropharyngeal edema or posterior oropharyngeal erythema.  Cardiovascular: Normal rate, regular rhythm and normal heart sounds.   No murmur heard. Pulmonary/Chest: Effort normal and breath sounds normal. She has no wheezes.  Nursing note and vitals reviewed.    Recent Results (from the past 2160 hour(s))  POCT INR     Status: Normal   Collection Time: 04/07/15 12:41 PM  Result Value Ref Range   INR 2.1   POCT INR     Status: Normal   Collection Time: 05/17/15  2:38 PM  Result Value Ref Range   INR 2.3      Assessment & Plan  1. Anticoagulant long-term use  - POCT INR  2. Acute bronchitis with symptoms > 10 days Symptoms consistent with acute bronchitis. We'll start on antibiotic and antitussive therapy. - azithromycin  (ZITHROMAX Z-PAK) 250 MG tablet; 2 tabs po x day 1, then 1 tab po q day x 4 days  Dispense: 6 each; Refill: 0 - benzonatate (TESSALON) 200 MG capsule; Take 1 capsule (200 mg total) by mouth 3 (three) times daily as needed for cough.  Dispense: 20 capsule; Refill: 0    Jashay Roddy [facility] 05/17/2015 3:05 PM

## 2015-07-06 ENCOUNTER — Ambulatory Visit (INDEPENDENT_AMBULATORY_CARE_PROVIDER_SITE_OTHER): Payer: Medicare Other

## 2015-07-06 DIAGNOSIS — E785 Hyperlipidemia, unspecified: Secondary | ICD-10-CM | POA: Diagnosis not present

## 2015-07-06 DIAGNOSIS — Z7901 Long term (current) use of anticoagulants: Secondary | ICD-10-CM | POA: Diagnosis not present

## 2015-07-06 LAB — POCT INR: INR: 2.3

## 2015-07-06 LAB — PROTIME-INR: INR: 2.3 — AB (ref 0.9–1.1)

## 2015-07-07 LAB — COMPREHENSIVE METABOLIC PANEL
ALBUMIN: 3.9 g/dL (ref 3.5–4.8)
ALK PHOS: 97 IU/L (ref 39–117)
ALT: 15 IU/L (ref 0–32)
AST: 17 IU/L (ref 0–40)
Albumin/Globulin Ratio: 1.4 (ref 1.1–2.5)
BILIRUBIN TOTAL: 0.3 mg/dL (ref 0.0–1.2)
BUN / CREAT RATIO: 17 (ref 11–26)
BUN: 20 mg/dL (ref 8–27)
CO2: 23 mmol/L (ref 18–29)
CREATININE: 1.2 mg/dL — AB (ref 0.57–1.00)
Calcium: 9.7 mg/dL (ref 8.7–10.3)
Chloride: 102 mmol/L (ref 96–106)
GFR calc Af Amer: 51 mL/min/{1.73_m2} — ABNORMAL LOW (ref >59)
GFR calc non Af Amer: 44 mL/min/{1.73_m2} — ABNORMAL LOW (ref >59)
GLOBULIN, TOTAL: 2.8 g/dL (ref 1.5–4.5)
Glucose: 96 mg/dL (ref 65–99)
Potassium: 4.7 mmol/L (ref 3.5–5.2)
SODIUM: 139 mmol/L (ref 134–144)
Total Protein: 6.7 g/dL (ref 6.0–8.5)

## 2015-07-07 LAB — LIPID PANEL
CHOLESTEROL TOTAL: 159 mg/dL (ref 100–199)
Chol/HDL Ratio: 3.8 ratio units (ref 0.0–4.4)
HDL: 42 mg/dL (ref >39)
LDL Calculated: 85 mg/dL (ref 0–99)
TRIGLYCERIDES: 159 mg/dL — AB (ref 0–149)
VLDL CHOLESTEROL CAL: 32 mg/dL (ref 5–40)

## 2015-07-15 ENCOUNTER — Other Ambulatory Visit: Payer: Self-pay | Admitting: Family Medicine

## 2015-07-28 ENCOUNTER — Ambulatory Visit: Payer: Medicare Other | Admitting: Family Medicine

## 2015-08-12 ENCOUNTER — Ambulatory Visit (INDEPENDENT_AMBULATORY_CARE_PROVIDER_SITE_OTHER): Payer: Medicare Other | Admitting: Family Medicine

## 2015-08-12 ENCOUNTER — Encounter: Payer: Self-pay | Admitting: Family Medicine

## 2015-08-12 VITALS — BP 120/68 | HR 100 | Temp 98.0°F | Resp 18 | Ht 66.0 in | Wt 144.4 lb

## 2015-08-12 DIAGNOSIS — I10 Essential (primary) hypertension: Secondary | ICD-10-CM

## 2015-08-12 DIAGNOSIS — F419 Anxiety disorder, unspecified: Secondary | ICD-10-CM

## 2015-08-12 DIAGNOSIS — E785 Hyperlipidemia, unspecified: Secondary | ICD-10-CM

## 2015-08-12 DIAGNOSIS — Z5181 Encounter for therapeutic drug level monitoring: Secondary | ICD-10-CM

## 2015-08-12 DIAGNOSIS — Z7901 Long term (current) use of anticoagulants: Secondary | ICD-10-CM

## 2015-08-12 DIAGNOSIS — J209 Acute bronchitis, unspecified: Secondary | ICD-10-CM | POA: Diagnosis not present

## 2015-08-12 LAB — POCT INR: INR: 2.3

## 2015-08-12 MED ORDER — ROSUVASTATIN CALCIUM 5 MG PO TABS: 5.0000 mg | ORAL_TABLET | Freq: Every day | ORAL | Status: DC

## 2015-08-12 MED ORDER — ATENOLOL 25 MG PO TABS: 25.0000 mg | ORAL_TABLET | Freq: Every day | ORAL | Status: DC

## 2015-08-12 MED ORDER — PAROXETINE HCL 20 MG PO TABS: 20.0000 mg | ORAL_TABLET | Freq: Every day | ORAL | Status: DC

## 2015-08-12 NOTE — Progress Notes (Signed)
Name: Christine Burnett   MRN: [id number]    DOB: 07/04/1940   Date:08/12/2015       Progress Note  Subjective  Chief Complaint  Chief Complaint  Patient presents with  . Anticoagulation    pt here for 3 month follow up  . Hyperlipidemia    Hyperlipidemia This is a chronic problem. The problem is controlled. Pertinent negatives include no chest pain or leg pain. Current antihyperlipidemic treatment includes statins.  Hypertension This is a chronic problem. The problem is unchanged. The problem is controlled. Associated symptoms include anxiety. Pertinent negatives include no blurred vision, chest pain, headaches or palpitations. Past treatments include beta blockers. The current treatment provides significant improvement. 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  Anxiety Presents for follow-up visit. The problem has been unchanged. Symptoms include dizziness. Patient reports no chest pain, excessive worry, insomnia, irritability, nervous/anxious behavior or palpitations.   Her past medical history is significant for anxiety/panic attacks. There is no history of depression. Past treatments include SSRIs. The treatment provided significant relief. Compliance with prior treatments has been good.   Anticoagulation Pt. Continues on anticoagulation therapy, Coumadin '2mg'$  every night. Last INR obtained on 07/06/15 was 2.3.  No episodes of bleeding.   Past Medical History  Diagnosis Date  . Hypertension   . A-fib ([place])   . CKD (chronic kidney disease), stage III   . Cornea scar   . Anxiety   . Risk for falls   . Situational disturbance   . Diverticulosis   . Chronic bronchitis ([place])   . Allergic rhinitis   . Osteoporosis   . TIA (transient ischemic attack)   . Hyperlipidemia     Past Surgical History  Procedure Laterality Date  . Esophagus surgery      Family History  Problem Relation Age of Onset  . Hypertension Mother   . Heart attack Father      Social History   Social History  . Marital Status: Divorced    Spouse Name: N/A  . Number of Children: N/A  . Years of Education: N/A   Occupational History  . Not on file.   Social History Main Topics  . Smoking status: Current Every Day Smoker -- 0.25 packs/day for 61 years    Types: Cigarettes  . Smokeless tobacco: Not on file  . Alcohol Use: No  . Drug Use: No  . Sexual Activity: Not on file   Other Topics Concern  . Not on file   Social History Narrative     Current outpatient prescriptions:  .  ALPRAZolam (XANAX) 0.25 MG tablet, Take 1 tablet (0.25 mg total) by mouth at bedtime as needed for anxiety., Disp: 30 tablet, Rfl: 2 .  atenolol (TENORMIN) 25 MG tablet, Take 0.5 tablets (12.5 mg total) by mouth daily., Disp: 90 tablet, Rfl: 0 .  PARoxetine (PAXIL) 20 MG tablet, Take 1 tablet (20 mg total) by mouth daily., Disp: 90 tablet, Rfl: 0 .  rosuvastatin (CRESTOR) 5 MG tablet, TAKE ONE TABLET BY MOUTH ONCE DAILY AT  6  PM, Disp: 30 tablet, Rfl: 0 .  warfarin (COUMADIN) 2 MG tablet, Take 1 tablet (2 mg total) by mouth daily at 6 PM., Disp: 90 tablet, Rfl: 3  Allergies  Allergen Reactions  . Cefuroxime Axetil     caused rash.  . Ciprofloxacin     caused rash.  . Codeine     Review of Systems  Constitutional: Negative for fever, chills and irritability.  Eyes:  Negative for blurred vision and double vision.  Respiratory: Negative for hemoptysis.   Cardiovascular: Negative for chest pain and palpitations.  Gastrointestinal: Negative for blood in stool and melena.  Genitourinary: Negative for hematuria.  Neurological: Positive for dizziness. Negative for headaches.  Psychiatric/Behavioral: The patient is not nervous/anxious and does not have insomnia.     Objective  Filed Vitals:   08/12/15 1047  BP: 120/68  Pulse: 100  Temp: 98 F (36.7 C)  Resp: 18  Height: '5\' 6"'$  (1.676 m)  Weight: 144 lb 6 oz (65.488 kg)  SpO2: 93%    Physical Exam   Constitutional: She is oriented to person, place, and time and well-developed, well-nourished, and in no distress.  HENT:  Head: Normocephalic and atraumatic.  Eyes: Pupils are equal, round, and reactive to light.  Cardiovascular: Normal rate and regular rhythm.   Pulmonary/Chest: Effort normal and breath sounds normal.  Abdominal: Soft. Bowel sounds are normal.  Musculoskeletal: Normal range of motion.  Neurological: She is alert and oriented to person, place, and time.  Nursing note and vitals reviewed.     Assessment & Plan  1. Essential hypertension  - atenolol (TENORMIN) 25 MG tablet; Take 1 tablet (25 mg total) by mouth daily.  Dispense: 90 tablet; Refill: 0  2. Acute bronchitis with symptoms > 10 days Advised to take Robitussin DM.  3. Anticoagulation goal of INR 2 to 3  - POCT INR  4. Anxiety  - PARoxetine (PAXIL) 20 MG tablet; Take 1 tablet (20 mg total) by mouth daily.  Dispense: 90 tablet; Refill: 0  5. Hyperlipidemia  - rosuvastatin (CRESTOR) 5 MG tablet; Take 1 tablet (5 mg total) by mouth daily at 6 PM.  Dispense: 90 tablet; Refill: 0 - Lipid Profile - Comprehensive Metabolic Panel (CMET)   Christine Burnett [facility] 08/12/2015 11:17 AM

## 2015-08-13 LAB — COMPREHENSIVE METABOLIC PANEL
A/G RATIO: 1.5 (ref 1.1–2.5)
ALT: 13 IU/L (ref 0–32)
AST: 19 IU/L (ref 0–40)
Albumin: 4.5 g/dL (ref 3.5–4.8)
Alkaline Phosphatase: 104 IU/L (ref 39–117)
BILIRUBIN TOTAL: 0.4 mg/dL (ref 0.0–1.2)
BUN / CREAT RATIO: 14 (ref 11–26)
BUN: 17 mg/dL (ref 8–27)
CALCIUM: 10 mg/dL (ref 8.7–10.3)
CHLORIDE: 103 mmol/L (ref 96–106)
CO2: 22 mmol/L (ref 18–29)
Creatinine, Ser: 1.23 mg/dL — ABNORMAL HIGH (ref 0.57–1.00)
GFR calc non Af Amer: 43 mL/min/{1.73_m2} — ABNORMAL LOW (ref >59)
GFR, EST AFRICAN AMERICAN: 50 mL/min/{1.73_m2} — AB (ref >59)
GLUCOSE: 103 mg/dL — AB (ref 65–99)
Globulin, Total: 3 g/dL (ref 1.5–4.5)
POTASSIUM: 4.7 mmol/L (ref 3.5–5.2)
Sodium: 142 mmol/L (ref 134–144)
Total Protein: 7.5 g/dL (ref 6.0–8.5)

## 2015-08-13 LAB — LIPID PANEL
Chol/HDL Ratio: 3.7 ratio units (ref 0.0–4.4)
Cholesterol, Total: 163 mg/dL (ref 100–199)
HDL: 44 mg/dL (ref >39)
LDL CALC: 77 mg/dL (ref 0–99)
TRIGLYCERIDES: 212 mg/dL — AB (ref 0–149)
VLDL Cholesterol Cal: 42 mg/dL — ABNORMAL HIGH (ref 5–40)

## 2015-09-16 ENCOUNTER — Ambulatory Visit (INDEPENDENT_AMBULATORY_CARE_PROVIDER_SITE_OTHER): Payer: Medicare Other

## 2015-09-16 DIAGNOSIS — Z7901 Long term (current) use of anticoagulants: Secondary | ICD-10-CM

## 2015-09-16 LAB — POCT INR: INR: 2

## 2015-09-16 LAB — PROTIME-INR: INR: 2 — AB (ref 0.9–1.1)

## 2015-09-16 NOTE — Patient Instructions (Signed)
Continue current dosage and return in  42month

## 2015-10-20 ENCOUNTER — Ambulatory Visit (INDEPENDENT_AMBULATORY_CARE_PROVIDER_SITE_OTHER): Payer: Medicare Other

## 2015-10-20 DIAGNOSIS — Z7901 Long term (current) use of anticoagulants: Secondary | ICD-10-CM

## 2015-10-20 LAB — PROTIME-INR: INR: 2.4 — AB (ref 0.9–1.1)

## 2015-10-20 LAB — POCT INR: INR: 29

## 2015-11-10 ENCOUNTER — Encounter: Payer: Self-pay | Admitting: Family Medicine

## 2015-11-10 ENCOUNTER — Ambulatory Visit (INDEPENDENT_AMBULATORY_CARE_PROVIDER_SITE_OTHER): Payer: Medicare Other | Admitting: Family Medicine

## 2015-11-10 VITALS — BP 122/64 | HR 87 | Temp 98.1°F | Resp 16 | Ht 66.0 in | Wt 146.6 lb

## 2015-11-10 DIAGNOSIS — Z7901 Long term (current) use of anticoagulants: Secondary | ICD-10-CM

## 2015-11-10 DIAGNOSIS — N3001 Acute cystitis with hematuria: Secondary | ICD-10-CM

## 2015-11-10 DIAGNOSIS — R739 Hyperglycemia, unspecified: Secondary | ICD-10-CM | POA: Diagnosis not present

## 2015-11-10 DIAGNOSIS — E785 Hyperlipidemia, unspecified: Secondary | ICD-10-CM

## 2015-11-10 DIAGNOSIS — E781 Pure hyperglyceridemia: Secondary | ICD-10-CM | POA: Insufficient documentation

## 2015-11-10 DIAGNOSIS — R300 Dysuria: Secondary | ICD-10-CM | POA: Diagnosis not present

## 2015-11-10 DIAGNOSIS — Z5181 Encounter for therapeutic drug level monitoring: Secondary | ICD-10-CM

## 2015-11-10 LAB — POCT URINALYSIS DIPSTICK
BILIRUBIN UA: NEGATIVE
GLUCOSE UA: NEGATIVE
KETONES UA: NEGATIVE
Nitrite, UA: NEGATIVE
SPEC GRAV UA: 1.02
Urobilinogen, UA: 0.2
pH, UA: 5

## 2015-11-10 LAB — GLUCOSE, POCT (MANUAL RESULT ENTRY): POC Glucose: 109 mg/dl — AB (ref 70–99)

## 2015-11-10 LAB — POCT INR: INR: 2.2

## 2015-11-10 LAB — POCT GLYCOSYLATED HEMOGLOBIN (HGB A1C): HEMOGLOBIN A1C: 5.8

## 2015-11-10 MED ORDER — PRAVASTATIN SODIUM 20 MG PO TABS: 20.0000 mg | ORAL_TABLET | Freq: Every day | ORAL | Status: DC

## 2015-11-10 MED ORDER — AMOXICILLIN-POT CLAVULANATE 875-125 MG PO TABS: 1.0000 | ORAL_TABLET | Freq: Two times a day (BID) | ORAL | Status: DC

## 2015-11-10 NOTE — Progress Notes (Signed)
Name: Christine Burnett   MRN: [id number]    DOB: 09-18-1940   Date:11/10/2015       Progress Note  Subjective  Chief Complaint  Chief Complaint  Patient presents with  . Follow-up    3 mo   . Medication Refill    pravastatin     Hyperlipidemia This is a chronic problem. The problem is uncontrolled. Recent lipid tests were reviewed and are high (Elevated Triglycerides.). Pertinent negatives include no chest pain, leg pain, myalgias or shortness of breath. Current antihyperlipidemic treatment includes statins (SHe stopped taking Crestor due to insomnia, resolved after discontinuation.). There are no compliance problems.   Urinary Tract Infection  This is a new problem. Episode onset: 3 weeks ago. The problem occurs intermittently. The problem has been waxing and waning. The quality of the pain is described as burning. There has been no fever. There is no history of pyelonephritis. Pertinent negatives include no chills, discharge, flank pain, frequency or hematuria. She has tried nothing for the symptoms.     Past Medical History  Diagnosis Date  . Hypertension   . A-fib ([place])   . CKD (chronic kidney disease), stage III   . Cornea scar   . Anxiety   . Risk for falls   . Situational disturbance   . Diverticulosis   . Chronic bronchitis ([place])   . Allergic rhinitis   . Osteoporosis   . TIA (transient ischemic attack)   . Hyperlipidemia     Past Surgical History  Procedure Laterality Date  . Esophagus surgery      Family History  Problem Relation Age of Onset  . Hypertension Mother   . Heart attack Father     Social History   Social History  . Marital Status: Divorced    Spouse Name: N/A  . Number of Children: N/A  . Years of Education: N/A   Occupational History  . Not on file.   Social History Main Topics  . Smoking status: Current Every Day Smoker -- 0.25 packs/day for 61 years    Types: Cigarettes  . Smokeless tobacco: Not on file  . Alcohol Use: No  .  Drug Use: No  . Sexual Activity: Not on file   Other Topics Concern  . Not on file   Social History Narrative     Current outpatient prescriptions:  .  ALPRAZolam (XANAX) 0.25 MG tablet, Take 1 tablet (0.25 mg total) by mouth at bedtime as needed for anxiety., Disp: 30 tablet, Rfl: 2 .  atenolol (TENORMIN) 25 MG tablet, Take 1 tablet (25 mg total) by mouth daily., Disp: 90 tablet, Rfl: 0 .  PARoxetine (PAXIL) 20 MG tablet, Take 1 tablet (20 mg total) by mouth daily., Disp: 90 tablet, Rfl: 0 .  rosuvastatin (CRESTOR) 5 MG tablet, , Disp: , Rfl:  .  warfarin (COUMADIN) 2 MG tablet, Take 1 tablet (2 mg total) by mouth daily at 6 PM., Disp: 90 tablet, Rfl: 3  Allergies  Allergen Reactions  . Cefuroxime Axetil     caused rash.  . Ciprofloxacin     caused rash.  . Codeine      Review of Systems  Constitutional: Negative for chills.  Respiratory: Negative for shortness of breath.   Cardiovascular: Negative for chest pain.  Genitourinary: Negative for frequency, hematuria and flank pain.  Musculoskeletal: Negative for myalgias.     Objective  Filed Vitals:   11/10/15 1054  BP: 122/64  Pulse: 87  Temp: 98.1 F (36.7  C)  TempSrc: Oral  Resp: 16  Height: '5\' 6"'$  (1.676 m)  Weight: 146 lb 9.6 oz (66.497 kg)  SpO2: 96%    Physical Exam  Constitutional: She is well-developed, well-nourished, and in no distress.  Cardiovascular: Normal rate, regular rhythm, S1 normal, S2 normal and normal heart sounds.  Exam reveals no gallop.   No murmur heard. Pulmonary/Chest: Effort normal and breath sounds normal. She has no decreased breath sounds. She has no wheezes.  Abdominal: Soft. Bowel sounds are normal. There is no tenderness. There is no CVA tenderness.  Psychiatric: Mood, memory, affect and judgment normal.  Nursing note and vitals reviewed.      Assessment & Plan  1. Hypertriglyceridemia Continue on statin therapy for now, consider starting on additional triglyceride  lowering therapy if still elevated - pravastatin (PRAVACHOL) 20 MG tablet; Take 1 tablet (20 mg total) by mouth daily.  Dispense: 90 tablet; Refill: 3  2. Hyperlipidemia  - Lipid Profile  3. Hyperglycemia Obtain A1c and glucose for evaluation of hypoglycemia - POCT HgB A1C - POCT Glucose (CBG)  4. Dysuria Urinalysis shows leukocytes and moderate blood. We'll send to [facility]. for analysis and culture - POCT Urinalysis Dipstick  5. Anticoagulation goal of INR 2 to 3 INR is 2.2, therapeutic. Continue on present anticoagulant regimen - POCT INR  6. Acute cystitis with hematuria We will start on Augmentin based on patient's symptoms, exam and urinalysis. May need antibiotic adjustment based on culture. - Urine Culture - Urinalysis, Routine w reflex microscopic - amoxicillin-clavulanate (AUGMENTIN) 875-125 MG tablet; Take 1 tablet by mouth 2 (two) times daily.  Dispense: 20 tablet; Refill: 0   [facility] 11/10/2015 11:08 AM

## 2015-11-11 LAB — LIPID PANEL
CHOLESTEROL TOTAL: 191 mg/dL (ref 100–199)
Chol/HDL Ratio: 4.2 ratio units (ref 0.0–4.4)
HDL: 46 mg/dL (ref >39)
LDL Calculated: 104 mg/dL — ABNORMAL HIGH (ref 0–99)
TRIGLYCERIDES: 206 mg/dL — AB (ref 0–149)
VLDL Cholesterol Cal: 41 mg/dL — ABNORMAL HIGH (ref 5–40)

## 2015-11-11 LAB — URINALYSIS, ROUTINE W REFLEX MICROSCOPIC
BILIRUBIN UA: NEGATIVE
GLUCOSE, UA: NEGATIVE
Ketones, UA: NEGATIVE
Leukocytes, UA: NEGATIVE
NITRITE UA: POSITIVE — AB
PH UA: 5 (ref 5.0–7.5)
PROTEIN UA: NEGATIVE
Specific Gravity, UA: 1.016 (ref 1.005–1.030)
UUROB: 0.2 mg/dL (ref 0.2–1.0)

## 2015-11-11 LAB — MICROSCOPIC EXAMINATION: Casts: NONE SEEN /lpf

## 2015-11-12 ENCOUNTER — Ambulatory Visit: Payer: Medicare Other | Admitting: Family Medicine

## 2015-11-12 LAB — URINE CULTURE

## 2015-12-22 ENCOUNTER — Ambulatory Visit: Payer: Medicare Other | Admitting: Family Medicine

## 2015-12-30 ENCOUNTER — Other Ambulatory Visit: Payer: Self-pay | Admitting: Family Medicine

## 2015-12-30 ENCOUNTER — Telehealth: Payer: Self-pay | Admitting: Family Medicine

## 2015-12-30 DIAGNOSIS — F419 Anxiety disorder, unspecified: Secondary | ICD-10-CM

## 2015-12-30 NOTE — Telephone Encounter (Signed)
Paroxetine has been sent to patient's pharmacy

## 2016-01-12 ENCOUNTER — Ambulatory Visit (INDEPENDENT_AMBULATORY_CARE_PROVIDER_SITE_OTHER): Payer: Medicare Other | Admitting: Family Medicine

## 2016-01-12 ENCOUNTER — Encounter: Payer: Self-pay | Admitting: Family Medicine

## 2016-01-12 VITALS — BP 120/68 | HR 91 | Temp 99.0°F | Resp 15 | Ht 66.0 in | Wt 147.0 lb

## 2016-01-12 DIAGNOSIS — R319 Hematuria, unspecified: Secondary | ICD-10-CM | POA: Diagnosis not present

## 2016-01-12 DIAGNOSIS — L298 Other pruritus: Secondary | ICD-10-CM | POA: Diagnosis not present

## 2016-01-12 DIAGNOSIS — Z7901 Long term (current) use of anticoagulants: Secondary | ICD-10-CM | POA: Diagnosis not present

## 2016-01-12 DIAGNOSIS — I10 Essential (primary) hypertension: Secondary | ICD-10-CM | POA: Diagnosis not present

## 2016-01-12 DIAGNOSIS — N898 Other specified noninflammatory disorders of vagina: Secondary | ICD-10-CM

## 2016-01-12 DIAGNOSIS — Z5181 Encounter for therapeutic drug level monitoring: Secondary | ICD-10-CM | POA: Diagnosis not present

## 2016-01-12 DIAGNOSIS — N8110 Cystocele, unspecified: Secondary | ICD-10-CM | POA: Diagnosis not present

## 2016-01-12 DIAGNOSIS — F419 Anxiety disorder, unspecified: Secondary | ICD-10-CM | POA: Diagnosis not present

## 2016-01-12 LAB — POCT URINALYSIS DIPSTICK
Glucose, UA: NEGATIVE
KETONES UA: NEGATIVE
Leukocytes, UA: NEGATIVE
Nitrite, UA: NEGATIVE
PH UA: 5
Protein, UA: NEGATIVE
SPEC GRAV UA: 1.015
Urobilinogen, UA: 0.2

## 2016-01-12 LAB — POCT INR
INR: 2
INR: 2

## 2016-01-12 MED ORDER — WARFARIN SODIUM 2 MG PO TABS
2.0000 mg | ORAL_TABLET | Freq: Every day | ORAL | 3 refills | Status: DC
Start: 2016-01-12 — End: 2017-01-16

## 2016-01-12 MED ORDER — ATENOLOL 25 MG PO TABS: 25.0000 mg | ORAL_TABLET | Freq: Every day | ORAL | 0 refills | Status: DC

## 2016-01-12 MED ORDER — ALPRAZOLAM 0.25 MG PO TABS: 0.2500 mg | ORAL_TABLET | Freq: Every evening | ORAL | 2 refills | Status: DC | PRN

## 2016-01-12 NOTE — Progress Notes (Signed)
Name: Christine Burnett   MRN: [id number]    DOB: 1940/09/19   Date:01/12/2016       Progress Note  Subjective  Chief Complaint  Chief Complaint  Patient presents with  . Follow-up    Bladder issues  . Medication Refill    Atenolol 25 mg / warfarin 2 mg     HPI  Burning in the Vaginal Area: Pt. Presents for persistent symptoms of burning in the vaginal area, present for over 2 months, now spreading over to the lower back. She was diagnosed and treated for a UTI 2 months ago, feels this time she has no urinary symptoms (burning on urination, blood in urination), fevers or chills. She feels heaviness in her lower pelvic area and has experienced symptoms of incontinence (both urine and stool). She does experience hot flashes 3x/day.   Past Medical History:  Diagnosis Date  . A-fib ([place])   . Allergic rhinitis   . Anxiety   . Chronic bronchitis ([place])   . CKD (chronic kidney disease), stage III   . Cornea scar   . Diverticulosis   . Hyperlipidemia   . Hypertension   . Osteoporosis   . Risk for falls   . Situational disturbance   . TIA (transient ischemic attack)     Past Surgical History:  Procedure Laterality Date  . ESOPHAGUS SURGERY      Family History  Problem Relation Age of Onset  . Hypertension Mother   . Heart attack Father     Social History   Social History  . Marital status: Divorced    Spouse name: N/A  . Number of children: N/A  . Years of education: N/A   Occupational History  . Not on file.   Social History Main Topics  . Smoking status: Current Every Day Smoker    Packs/day: 0.25    Years: 61.00    Types: Cigarettes  . Smokeless tobacco: Never Used  . Alcohol use No  . Drug use: No  . Sexual activity: No   Other Topics Concern  . Not on file   Social History Narrative  . No narrative on file     Current Outpatient Prescriptions:  .  ALPRAZolam (XANAX) 0.25 MG tablet, Take 1 tablet (0.25 mg total) by mouth at bedtime as needed for  anxiety., Disp: 30 tablet, Rfl: 2 .  atenolol (TENORMIN) 25 MG tablet, Take 1 tablet (25 mg total) by mouth daily., Disp: 90 tablet, Rfl: 0 .  PARoxetine (PAXIL) 20 MG tablet, TAKE ONE TABLET BY MOUTH ONCE DAILY, Disp: 90 tablet, Rfl: 0 .  pravastatin (PRAVACHOL) 20 MG tablet, Take 1 tablet (20 mg total) by mouth daily., Disp: 90 tablet, Rfl: 3 .  warfarin (COUMADIN) 2 MG tablet, Take 1 tablet (2 mg total) by mouth daily at 6 PM., Disp: 90 tablet, Rfl: 3 .  amoxicillin-clavulanate (AUGMENTIN) 875-125 MG tablet, Take 1 tablet by mouth 2 (two) times daily. (Patient not taking: Reported on 01/12/2016), Disp: 20 tablet, Rfl: 0  Allergies  Allergen Reactions  . Cefuroxime Axetil     caused rash.  . Ciprofloxacin     caused rash.  . Codeine      Review of Systems  Constitutional: Negative for chills and fever.  Genitourinary: Negative for dysuria, frequency and hematuria.  Psychiatric/Behavioral: The patient is nervous/anxious.     Objective  Vitals:   01/12/16 1048  BP: 120/68  Pulse: 91  Resp: 15  Temp: 99 F (37.2 C)  TempSrc: Oral  SpO2: 96%  Weight: 147 lb (66.7 kg)  Height: '5\' 6"'$  (1.676 m)    Physical Exam  Genitourinary: Cervix normal. Cervix exhibits no lesion. Vulva exhibits no erythema, no lesion and no rash. Vagina exhibits normal mucosa, no exudate, no lesion and no rugosity. No vaginal discharge found.  Genitourinary Comments: On speculum and bimanual exam, there is a palpable structure in the vagina, likely prolapsed bladder.   Nursing note and vitals reviewed.     Assessment & Plan  1. Anxiety Stable, refills and alprazolam provided - ALPRAZolam (XANAX) 0.25 MG tablet; Take 1 tablet (0.25 mg total) by mouth at bedtime as needed for anxiety.  Dispense: 30 tablet; Refill: 2  2. Vaginal pruritus Urinalysis is unremarkable except for blood in the urine, vaginal mucosa is normal - POCT Urinalysis Dipstick  3. Essential hypertension  - atenolol (TENORMIN)  25 MG tablet; Take 1 tablet (25 mg total) by mouth daily.  Dispense: 90 tablet; Refill: 0  4. Anticoagulation goal of INR 2 to 3 INR therapeutic, continue warfarin - warfarin (COUMADIN) 2 MG tablet; Take 1 tablet (2 mg total) by mouth daily at 6 PM.  Dispense: 90 tablet; Refill: 3 - POCT INR  5. Female bladder prolapse, acquired Likely contributing to the pressure-like sensation in the pelvic area along with pruritus. Referral to urology - Ambulatory referral to Urology  6. Hematuria Obtain culture and microscopic exam to rule out a UTI - Urinalysis, Routine w reflex microscopic - Urine Culture - COMPLETE METABOLIC PANEL WITH GFR   Christine Burnett [facility] 01/12/2016 11:09 AM

## 2016-01-13 LAB — COMPLETE METABOLIC PANEL WITH GFR
ALBUMIN: 4.1 g/dL (ref 3.6–5.1)
ALT: 13 U/L (ref 6–29)
AST: 16 U/L (ref 10–35)
Alkaline Phosphatase: 78 U/L (ref 33–130)
BILIRUBIN TOTAL: 0.5 mg/dL (ref 0.2–1.2)
BUN: 19 mg/dL (ref 7–25)
CO2: 26 mmol/L (ref 20–31)
CREATININE: 1.34 mg/dL — AB (ref 0.60–0.93)
Calcium: 9.8 mg/dL (ref 8.6–10.4)
Chloride: 104 mmol/L (ref 98–110)
GFR, EST AFRICAN AMERICAN: 45 mL/min — AB (ref >=60)
GFR, EST NON AFRICAN AMERICAN: 39 mL/min — AB (ref >=60)
GLUCOSE: 98 mg/dL (ref 65–99)
Potassium: 4.9 mmol/L (ref 3.5–5.3)
SODIUM: 140 mmol/L (ref 135–146)
TOTAL PROTEIN: 6.9 g/dL (ref 6.1–8.1)

## 2016-01-13 LAB — URINALYSIS, ROUTINE W REFLEX MICROSCOPIC
BILIRUBIN URINE: NEGATIVE
GLUCOSE, UA: NEGATIVE
Ketones, ur: NEGATIVE
Leukocytes, UA: NEGATIVE
Nitrite: NEGATIVE
Protein, ur: NEGATIVE
SPECIFIC GRAVITY, URINE: 1.016 (ref 1.001–1.035)
pH: 5.5 (ref 5.0–8.0)

## 2016-01-13 LAB — URINALYSIS, MICROSCOPIC ONLY
BACTERIA UA: NONE SEEN [HPF]
CASTS: NONE SEEN [LPF]
CRYSTALS: NONE SEEN [HPF]
WBC UA: NONE SEEN WBC/HPF (ref <=5)
Yeast: NONE SEEN [HPF]

## 2016-01-13 LAB — URINE CULTURE: Organism ID, Bacteria: NO GROWTH

## 2016-01-28 ENCOUNTER — Ambulatory Visit (INDEPENDENT_AMBULATORY_CARE_PROVIDER_SITE_OTHER): Payer: Medicare Other | Admitting: Urology

## 2016-01-28 ENCOUNTER — Encounter: Payer: Self-pay | Admitting: Urology

## 2016-01-28 VITALS — BP 120/73 | HR 72 | Ht 66.0 in | Wt 147.6 lb

## 2016-01-28 DIAGNOSIS — R102 Pelvic and perineal pain unspecified side: Secondary | ICD-10-CM

## 2016-01-28 DIAGNOSIS — R3129 Other microscopic hematuria: Secondary | ICD-10-CM | POA: Diagnosis not present

## 2016-01-28 DIAGNOSIS — N9489 Other specified conditions associated with female genital organs and menstrual cycle: Secondary | ICD-10-CM

## 2016-01-28 LAB — BLADDER SCAN AMB NON-IMAGING: Scan Result: 0

## 2016-01-28 LAB — URINALYSIS, COMPLETE
BILIRUBIN UA: NEGATIVE
GLUCOSE, UA: NEGATIVE
KETONES UA: NEGATIVE
Nitrite, UA: NEGATIVE
PROTEIN UA: NEGATIVE
SPEC GRAV UA: 1.025 (ref 1.005–1.030)
Urobilinogen, Ur: 0.2 mg/dL (ref 0.2–1.0)
pH, UA: 5 (ref 5.0–7.5)

## 2016-01-28 LAB — MICROSCOPIC EXAMINATION

## 2016-01-28 NOTE — Progress Notes (Signed)
01/28/2016 9:05 AM   Christine Burnett [birth year] [id number]  Referring provider: Roselee Nova, MD [address] [place], [place] [address]  Chief Complaint  Patient presents with  . New Patient (Initial Visit)    possible bladder prolapse     HPI: The patient for approximately 8 weeks complains of vaginal burning. Feel vaginal bulging but she has been told that she has prolapse. She has not had a hysterectomy  He sometimes leaks with coughing and sneezing and urgency but not on every occasion. She denies incontinence with bending and lifting and has no enuresis. She voids 5 times a day gets up 2-3 times a night and voids with a good flow  She reports vaginal itchiness but not dryness  She denies a history of kidney stones previous GU surgery and urinary tract infection. She has no neurologic issues. She has had a TIA. She has loose bowel movements.  Modifying factors: There are no other modifying factors  Associated signs and symptoms: There are no other associated signs and symptoms Aggravating and relieving factors: There are no other aggravating or relieving factors Severity: Moderate Duration: Persistent       PMH: Past Medical History:  Diagnosis Date  . A-fib ([place])   . Allergic rhinitis   . Anxiety   . Chronic bronchitis ([place])   . CKD (chronic kidney disease), stage III   . Cornea scar   . Diverticulosis   . Hyperlipidemia   . Hypertension   . Osteoporosis   . Risk for falls   . Situational disturbance   . TIA (transient ischemic attack)     Surgical History: Past Surgical History:  Procedure Laterality Date  . ESOPHAGUS SURGERY      Home Medications:    Medication List       Accurate as of 01/28/16  9:05 AM. Always use your most recent med list.          ALPRAZolam 0.25 MG tablet Commonly known as:  XANAX Take 1 tablet (0.25 mg total) by mouth at bedtime as needed for anxiety.   atenolol 25 MG tablet Commonly known as:   TENORMIN Take 1 tablet (25 mg total) by mouth daily.   PARoxetine 20 MG tablet Commonly known as:  PAXIL TAKE ONE TABLET BY MOUTH ONCE DAILY   pravastatin 20 MG tablet Commonly known as:  PRAVACHOL Take 1 tablet (20 mg total) by mouth daily.   warfarin 2 MG tablet Commonly known as:  COUMADIN Take 1 tablet (2 mg total) by mouth daily at 6 PM.       Allergies:  Allergies  Allergen Reactions  . Cefuroxime Axetil     caused rash.  . Ciprofloxacin     caused rash.  . Codeine     Family History: Family History  Problem Relation Age of Onset  . Hypertension Mother   . Heart attack Father     Social History:  reports that she has been smoking Cigarettes.  She has a 15.25 pack-year smoking history. She has never used smokeless tobacco. She reports that she does not drink alcohol or use drugs.  ROS: UROLOGY Frequent Urination?: Yes Hard to postpone urination?: Yes Burning/pain with urination?: No Get up at night to urinate?: Yes Leakage of urine?: Yes Urine stream starts and stops?: No Trouble starting stream?: No Do you have to strain to urinate?: No Blood in urine?: No Urinary tract infection?: No Sexually transmitted disease?: No Injury to kidneys or bladder?:  No Painful intercourse?: No Weak stream?: No Currently pregnant?: No Vaginal bleeding?: No Last menstrual period?: n  Gastrointestinal Nausea?: No Vomiting?: No Indigestion/heartburn?: No Diarrhea?: Yes Constipation?: No  Constitutional Fever: No Night sweats?: No Weight loss?: No Fatigue?: Yes  Skin Skin rash/lesions?: No Itching?: No  Eyes Blurred vision?: No Double vision?: No  Ears/Nose/Throat Sore throat?: No Sinus problems?: No  Hematologic/Lymphatic Swollen glands?: No Easy bruising?: Yes  Cardiovascular Leg swelling?: No Chest pain?: No  Respiratory Cough?: Yes Shortness of breath?: Yes  Endocrine Excessive thirst?: No  Musculoskeletal Back pain?: No Joint  pain?: Yes  Neurological Headaches?: No Dizziness?: No  Psychologic Depression?: Yes Anxiety?: Yes  Physical Exam: BP 120/73   Pulse 72   Ht '5\' 6"'$  (1.676 m)   Wt 147 lb 9.6 oz (67 kg)   BMI 23.82 kg/m   Constitutional:  Alert and oriented, No acute distress. HEENT: [place] AT, moist mucus membranes.  Trachea midline, no masses. Cardiovascular: No clubbing, cyanosis, or edema. Respiratory: Normal respiratory effort, no increased work of breathing. GI: Abdomen is soft, nontender, nondistended, no abdominal masses GU: No CVA tenderness. High mild to moderate grade 2 cystocele that did not descend. Oh rectocele. No stress incontinence Skin: No rashes, bruises or suspicious lesions. Lymph: No cervical or inguinal adenopathy. Neurologic: Grossly intact, no focal deficits, moving all 4 extremities. Psychiatric: Normal mood and affect.  Laboratory Data: Lab Results  Component Value Date   WBC 9.3 01/15/2015   HGB 14.2 07/03/2014   HCT 38.9 01/15/2015   MCV 99 (H) 01/15/2015   PLT 278 01/15/2015    Lab Results  Component Value Date   CREATININE 1.34 (H) 01/12/2016    No results found for: PSA  No results found for: TESTOSTERONE  Lab Results  Component Value Date   HGBA1C 5.8 11/10/2015    Urinalysis    Component Value Date/Time   COLORURINE YELLOW 01/12/2016 1154   APPEARANCEUR CLEAR 01/12/2016 1154   APPEARANCEUR Cloudy (A) 11/10/2015 0000   LABSPEC 1.016 01/12/2016 1154   PHURINE 5.5 01/12/2016 1154   GLUCOSEU NEGATIVE 01/12/2016 1154   HGBUR TRACE (A) 01/12/2016 1154   BILIRUBINUR NEGATIVE 01/12/2016 1154   BILIRUBINUR Small 01/12/2016 1138   BILIRUBINUR Negative 11/10/2015 0000   KETONESUR NEGATIVE 01/12/2016 1154   PROTEINUR NEGATIVE 01/12/2016 1154   UROBILINOGEN 0.2 01/12/2016 1138   NITRITE NEGATIVE 01/12/2016 1154   LEUKOCYTESUR NEGATIVE 01/12/2016 1154   LEUKOCYTESUR Negative 11/10/2015 0000    Pertinent Imaging: none  Assessment & Plan:  The  patient primarily has vaginal burning. She was also noted to have microscopic hematuria IIn my opinion the patient's vaginal symptoms are due to vaginal dryness. She did have moderate vaginal atrophy associated with the above findings. Role of local estrogen cream described. She would need to be followed by her gynecologist is she stays on the cream long-term since she still has her uterus. She has never had breast cancer. The workup for microscopic hematuria was discussed.  After thorough discussion and discussing differential diagnoses the patient chose watchful waiting for issues. She did not want to be worked up for blood in the urine or try estrogen cream  I will see her when necessary   1. Pelvic pressure in female 2. Mild cystocele 3. Microscopic hematuria.  - Urinalysis, Complete   No Follow-up on file.  Reece Packer, MD  [facility] [address] [place], [place] [address] [phone]

## 2016-01-28 NOTE — Addendum Note (Signed)
Addended by: Wilson Singer on: 01/28/2016 09:48 AM   Modules accepted: Orders

## 2016-02-10 ENCOUNTER — Ambulatory Visit (INDEPENDENT_AMBULATORY_CARE_PROVIDER_SITE_OTHER): Payer: Medicare Other | Admitting: Family Medicine

## 2016-02-10 ENCOUNTER — Encounter: Payer: Self-pay | Admitting: Family Medicine

## 2016-02-10 VITALS — BP 118/82 | HR 76 | Temp 98.0°F | Resp 18 | Ht 66.0 in | Wt 149.2 lb

## 2016-02-10 DIAGNOSIS — E781 Pure hyperglyceridemia: Secondary | ICD-10-CM | POA: Diagnosis not present

## 2016-02-10 DIAGNOSIS — R748 Abnormal levels of other serum enzymes: Secondary | ICD-10-CM

## 2016-02-10 DIAGNOSIS — E785 Hyperlipidemia, unspecified: Secondary | ICD-10-CM | POA: Diagnosis not present

## 2016-02-10 DIAGNOSIS — R7989 Other specified abnormal findings of blood chemistry: Secondary | ICD-10-CM

## 2016-02-10 LAB — COMPLETE METABOLIC PANEL WITH GFR
ALBUMIN: 4.1 g/dL (ref 3.6–5.1)
ALK PHOS: 98 U/L (ref 33–130)
ALT: 12 U/L (ref 6–29)
AST: 16 U/L (ref 10–35)
BUN: 19 mg/dL (ref 7–25)
CALCIUM: 9.5 mg/dL (ref 8.6–10.4)
CO2: 26 mmol/L (ref 20–31)
CREATININE: 1.2 mg/dL — AB (ref 0.60–0.93)
Chloride: 106 mmol/L (ref 98–110)
GFR, Est African American: 51 mL/min — ABNORMAL LOW (ref >=60)
GFR, Est Non African American: 44 mL/min — ABNORMAL LOW (ref >=60)
Glucose, Bld: 108 mg/dL — ABNORMAL HIGH (ref 65–99)
POTASSIUM: 4.6 mmol/L (ref 3.5–5.3)
Sodium: 140 mmol/L (ref 135–146)
Total Bilirubin: 0.5 mg/dL (ref 0.2–1.2)
Total Protein: 7.3 g/dL (ref 6.1–8.1)

## 2016-02-10 LAB — LIPID PANEL
CHOL/HDL RATIO: 3.8 ratio (ref <=5.0)
CHOLESTEROL: 176 mg/dL (ref 125–200)
HDL: 46 mg/dL (ref >=46)
LDL Cholesterol: 89 mg/dL (ref <130)
TRIGLYCERIDES: 204 mg/dL — AB (ref <150)
VLDL: 41 mg/dL — ABNORMAL HIGH (ref <30)

## 2016-02-10 NOTE — Progress Notes (Signed)
Name: Christine Burnett   MRN: [id number]    DOB: [birth year]   Date:02/10/2016       Progress Note  Subjective  Chief Complaint  Chief Complaint  Patient presents with  . Hyperlipidemia    pt here for 3 month follow up  . Hypertension    Hyperlipidemia  This is a chronic problem. The problem is uncontrolled. Recent lipid tests were reviewed and are high (Elevated Triglycerdies and LDL). Pertinent negatives include no leg pain, myalgias or shortness of breath. Current antihyperlipidemic treatment includes statins.   Pt. Had elevated serum creatinine at last lab work 4 weeks ago, creatinine levated to 1.34, GFR 39, she has no known history of kidney disease, does not take NSAIDS, her mother dies of 'uremic poisoning'. Pt. Denies any urinary concerns at this time. She will be referred to Nephrology.   Past Medical History:  Diagnosis Date  . A-fib ([place])   . Allergic rhinitis   . Anxiety   . Chronic bronchitis ([place])   . CKD (chronic kidney disease), stage III   . Cornea scar   . Diverticulosis   . Hyperlipidemia   . Hypertension   . Osteoporosis   . Risk for falls   . Situational disturbance   . TIA (transient ischemic attack)     Past Surgical History:  Procedure Laterality Date  . ESOPHAGUS SURGERY      Family History  Problem Relation Age of Onset  . Hypertension Mother   . Heart attack Father     Social History   Social History  . Marital status: Divorced    Spouse name: N/A  . Number of children: N/A  . Years of education: N/A   Occupational History  . Not on file.   Social History Main Topics  . Smoking status: Current Every Day Smoker    Packs/day: 0.25    Years: 61.00    Types: Cigarettes  . Smokeless tobacco: Never Used  . Alcohol use No  . Drug use: No  . Sexual activity: No   Other Topics Concern  . Not on file   Social History Narrative  . No narrative on file     Current Outpatient Prescriptions:  .  ALPRAZolam (XANAX) 0.25 MG  tablet, Take 1 tablet (0.25 mg total) by mouth at bedtime as needed for anxiety., Disp: 30 tablet, Rfl: 2 .  atenolol (TENORMIN) 25 MG tablet, Take 1 tablet (25 mg total) by mouth daily., Disp: 90 tablet, Rfl: 0 .  PARoxetine (PAXIL) 20 MG tablet, TAKE ONE TABLET BY MOUTH ONCE DAILY, Disp: 90 tablet, Rfl: 0 .  pravastatin (PRAVACHOL) 20 MG tablet, Take 1 tablet (20 mg total) by mouth daily., Disp: 90 tablet, Rfl: 3 .  warfarin (COUMADIN) 2 MG tablet, Take 1 tablet (2 mg total) by mouth daily at 6 PM., Disp: 90 tablet, Rfl: 3  Allergies  Allergen Reactions  . Cefuroxime Axetil     caused rash.  . Ciprofloxacin     caused rash.  . Codeine      Review of Systems  Constitutional: Negative for chills and fever.  Respiratory: Negative for shortness of breath.   Musculoskeletal: Negative for myalgias.    Objective  Vitals:   02/10/16 1024  BP: 118/82  Pulse: 76  Resp: 18  Temp: 98 F (36.7 C)  SpO2: 93%  Weight: 149 lb 4 oz (67.7 kg)  Height: '5\' 6"'$  (1.676 m)    Physical Exam  Constitutional: She is oriented to  person, place, and time and well-developed, well-nourished, and in no distress.  HENT:  Head: Normocephalic and atraumatic.  Cardiovascular: Normal rate, regular rhythm, S1 normal, S2 normal and normal heart sounds.   No murmur heard. Pulmonary/Chest: Breath sounds normal. She has no wheezes.  Abdominal: Soft. Bowel sounds are normal. There is no tenderness.  Musculoskeletal:       Right ankle: She exhibits no swelling.       Left ankle: She exhibits no swelling.  Neurological: She is alert and oriented to person, place, and time.  Nursing note and vitals reviewed.    Assessment & Plan  1. Hyperlipidemia Repeat FLP, still on pravastatin 20 mg at bedtime. May need to start on higher intensity therapy - Lipid Profile  2. Hypertriglyceridemia  - Lipid Profile  3. Elevated serum creatinine Increase in creatinine, now at 1.34, GFR at 39, we'll provide referral  to nephrology - COMPLETE METABOLIC PANEL WITH GFR - Ambulatory referral to Nephrology   Dossie [facility] 02/10/2016 10:29 AM

## 2016-03-22 ENCOUNTER — Ambulatory Visit (INDEPENDENT_AMBULATORY_CARE_PROVIDER_SITE_OTHER): Payer: Medicare Other

## 2016-03-22 DIAGNOSIS — N183 Chronic kidney disease, stage 3 (moderate): Secondary | ICD-10-CM | POA: Diagnosis not present

## 2016-03-22 DIAGNOSIS — Z23 Encounter for immunization: Secondary | ICD-10-CM | POA: Diagnosis not present

## 2016-03-22 DIAGNOSIS — I10 Essential (primary) hypertension: Secondary | ICD-10-CM | POA: Diagnosis not present

## 2016-03-22 DIAGNOSIS — R3121 Asymptomatic microscopic hematuria: Secondary | ICD-10-CM | POA: Diagnosis not present

## 2016-03-22 DIAGNOSIS — Z5181 Encounter for therapeutic drug level monitoring: Secondary | ICD-10-CM | POA: Diagnosis not present

## 2016-03-22 LAB — POCT INR: INR: 2.2

## 2016-03-22 NOTE — Patient Instructions (Signed)
Patient has been instructed to continue on present dosage and return in one month

## 2016-03-31 ENCOUNTER — Other Ambulatory Visit: Payer: Self-pay | Admitting: Family Medicine

## 2016-03-31 DIAGNOSIS — F419 Anxiety disorder, unspecified: Secondary | ICD-10-CM

## 2016-04-04 ENCOUNTER — Other Ambulatory Visit: Payer: Self-pay | Admitting: Family Medicine

## 2016-04-04 DIAGNOSIS — F419 Anxiety disorder, unspecified: Secondary | ICD-10-CM

## 2016-04-09 ENCOUNTER — Other Ambulatory Visit: Payer: Self-pay | Admitting: Family Medicine

## 2016-04-09 DIAGNOSIS — I10 Essential (primary) hypertension: Secondary | ICD-10-CM

## 2016-04-24 ENCOUNTER — Ambulatory Visit: Payer: Medicare Other

## 2016-05-11 ENCOUNTER — Encounter: Payer: Self-pay | Admitting: Family Medicine

## 2016-05-11 ENCOUNTER — Ambulatory Visit (INDEPENDENT_AMBULATORY_CARE_PROVIDER_SITE_OTHER): Payer: Medicare Other | Admitting: Family Medicine

## 2016-05-11 VITALS — BP 116/80 | HR 85 | Temp 98.0°F | Resp 17 | Ht 66.0 in | Wt 147.1 lb

## 2016-05-11 DIAGNOSIS — Z5181 Encounter for therapeutic drug level monitoring: Secondary | ICD-10-CM | POA: Diagnosis not present

## 2016-05-11 DIAGNOSIS — E781 Pure hyperglyceridemia: Secondary | ICD-10-CM | POA: Diagnosis not present

## 2016-05-11 DIAGNOSIS — F419 Anxiety disorder, unspecified: Secondary | ICD-10-CM

## 2016-05-11 DIAGNOSIS — Z7901 Long term (current) use of anticoagulants: Secondary | ICD-10-CM | POA: Diagnosis not present

## 2016-05-11 DIAGNOSIS — I10 Essential (primary) hypertension: Secondary | ICD-10-CM

## 2016-05-11 LAB — POCT INR: INR: 1.8

## 2016-05-11 MED ORDER — PAROXETINE HCL 20 MG PO TABS: 20.0000 mg | ORAL_TABLET | Freq: Every day | ORAL | 0 refills | Status: DC

## 2016-05-11 MED ORDER — ALPRAZOLAM 0.25 MG PO TABS: 0.2500 mg | ORAL_TABLET | Freq: Every evening | ORAL | 2 refills | Status: DC | PRN

## 2016-05-11 MED ORDER — ATENOLOL 25 MG PO TABS: 25.0000 mg | ORAL_TABLET | Freq: Every day | ORAL | 0 refills | Status: DC

## 2016-05-11 NOTE — Progress Notes (Signed)
Name: Christine Burnett   MRN: [id number]    DOB: 1940-07-11   Date:05/11/2016       Progress Note  Subjective  Chief Complaint  Chief Complaint  Patient presents with  . Follow-up    3 mo    Hyperlipidemia  This is a chronic problem. The problem is controlled. Recent lipid tests were reviewed and are high (elevated triglycerides in September 2017). Pertinent negatives include no chest pain, leg pain or shortness of breath. Current antihyperlipidemic treatment includes statins. The current treatment provides significant improvement of lipids.  Hypertension  This is a chronic problem. The problem is unchanged. The problem is controlled. Pertinent negatives include no anxiety, blurred vision, chest pain, headaches, palpitations or shortness of breath. Past treatments include beta blockers. The current treatment provides significant improvement. 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  Anxiety  Presents for follow-up visit. The problem has been unchanged. Patient reports no chest pain, dizziness, excessive worry, insomnia, irritability, nervous/anxious behavior, palpitations or shortness of breath. The severity of symptoms is moderate.   Her past medical history is significant for anxiety/panic attacks. There is no history of depression. Past treatments include SSRIs. The treatment provided significant relief. Compliance with prior treatments has been good.   Anticoagulation: Pt. Is being anticoagulated because of history of TIA, INR today is subtherapeutic at 1.8, she is taking Warfarin 2 mg daily, no episodes of bleeding, chest pain, or leg swelling.    Past Medical History:  Diagnosis Date  . A-fib ([place])   . Allergic rhinitis   . Anxiety   . Chronic bronchitis ([place])   . CKD (chronic kidney disease), stage III   . Cornea scar   . Diverticulosis   . Hyperlipidemia   . Hypertension   . Osteoporosis   . Risk for falls   . Situational disturbance   . TIA  (transient ischemic attack)     Past Surgical History:  Procedure Laterality Date  . ESOPHAGUS SURGERY      Family History  Problem Relation Age of Onset  . Hypertension Mother   . Heart attack Father     Social History   Social History  . Marital status: Divorced    Spouse name: N/A  . Number of children: N/A  . Years of education: N/A   Occupational History  . Not on file.   Social History Main Topics  . Smoking status: Current Every Day Smoker    Packs/day: 0.25    Years: 61.00    Types: Cigarettes  . Smokeless tobacco: Never Used  . Alcohol use No  . Drug use: No  . Sexual activity: No   Other Topics Concern  . Not on file   Social History Narrative  . No narrative on file     Current Outpatient Prescriptions:  .  ALPRAZolam (XANAX) 0.25 MG tablet, Take 1 tablet (0.25 mg total) by mouth at bedtime as needed for anxiety., Disp: 30 tablet, Rfl: 2 .  atenolol (TENORMIN) 25 MG tablet, TAKE ONE TABLET BY MOUTH ONCE DAILY, Disp: 90 tablet, Rfl: 0 .  PARoxetine (PAXIL) 20 MG tablet, TAKE ONE TABLET BY MOUTH ONCE DAILY, Disp: 90 tablet, Rfl: 0 .  PARoxetine (PAXIL) 20 MG tablet, TAKE ONE TABLET BY MOUTH ONCE DAILY, Disp: 90 tablet, Rfl: 0 .  pravastatin (PRAVACHOL) 20 MG tablet, Take 1 tablet (20 mg total) by mouth daily., Disp: 90 tablet, Rfl: 3 .  warfarin (COUMADIN) 2 MG tablet, Take 1 tablet (2 mg  total) by mouth daily at 6 PM., Disp: 90 tablet, Rfl: 3  Allergies  Allergen Reactions  . Cefuroxime Axetil     caused rash.  . Ciprofloxacin     caused rash.  . Codeine      Review of Systems  Constitutional: Negative for irritability.  Eyes: Negative for blurred vision.  Respiratory: Negative for shortness of breath.   Cardiovascular: Negative for chest pain and palpitations.  Neurological: Negative for dizziness and headaches.  Psychiatric/Behavioral: The patient is not nervous/anxious and does not have insomnia.     Objective  Vitals:   05/11/16  0953  BP: 116/80  Pulse: 85  Resp: 17  Temp: 98 F (36.7 C)  TempSrc: Oral  SpO2: 94%  Weight: 147 lb 1.6 oz (66.7 kg)  Height: '5\' 6"'$  (1.676 m)    Physical Exam  Constitutional: She is oriented to person, place, and time and well-developed, well-nourished, and in no distress.  HENT:  Head: Normocephalic and atraumatic.  Cardiovascular: Normal rate, regular rhythm, S1 normal, S2 normal and normal heart sounds.   No murmur heard. Pulmonary/Chest: Breath sounds normal. She has no wheezes.  Abdominal: Soft. Bowel sounds are normal. There is no tenderness.  Musculoskeletal: She exhibits no edema.       Right ankle: She exhibits no swelling.       Left ankle: She exhibits no swelling.  Neurological: She is alert and oriented to person, place, and time.  Psychiatric: Mood, memory, affect and judgment normal.  Nursing note and vitals reviewed.     Recent Results (from the past 2160 hour(s))  POCT INR     Status: None   Collection Time: 03/22/16  2:41 PM  Result Value Ref Range   INR 2.2      Assessment & Plan  1. Essential hypertension Stable and responsive to atenolol, continue - atenolol (TENORMIN) 25 MG tablet; Take 1 tablet (25 mg total) by mouth daily.  Dispense: 90 tablet; Refill: 0  2. Anxiety Stable and responsive to Paxil and alprazolam when needed. - PARoxetine (PAXIL) 20 MG tablet; Take 1 tablet (20 mg total) by mouth daily.  Dispense: 90 tablet; Refill: 0 - ALPRAZolam (XANAX) 0.25 MG tablet; Take 1 tablet (0.25 mg total) by mouth at bedtime as needed for anxiety.  Dispense: 30 tablet; Refill: 2  3. Anticoagulation goal of INR 2 to 3 INR is 1.8, subtherapeutic. Advised to increase warfarin to 3 mg for next 4 days starting tonight, return on Monday, December 11 to repeat - POCT INR  4. Hypertriglyceridemia Recheck FLP in 3 months.    Greer Koeppen [facility] 05/11/2016 10:14 AM

## 2016-05-15 ENCOUNTER — Ambulatory Visit: Payer: Medicare Other

## 2016-05-15 LAB — POCT INR: INR: 2.9

## 2016-05-15 NOTE — Patient Instructions (Signed)
Patient has been instructed to continue on present dosage and return in one month

## 2016-06-12 ENCOUNTER — Ambulatory Visit (INDEPENDENT_AMBULATORY_CARE_PROVIDER_SITE_OTHER): Payer: Medicare Other

## 2016-06-12 DIAGNOSIS — Z7901 Long term (current) use of anticoagulants: Secondary | ICD-10-CM

## 2016-06-12 LAB — PROTIME-INR: INR: 2.1 — AB (ref 0.9–1.1)

## 2016-06-12 LAB — POCT INR: INR: 2.1

## 2016-07-24 ENCOUNTER — Telehealth: Payer: Self-pay | Admitting: Family Medicine

## 2016-07-24 DIAGNOSIS — Z20828 Contact with and (suspected) exposure to other viral communicable diseases: Secondary | ICD-10-CM

## 2016-07-24 MED ORDER — OSELTAMIVIR PHOSPHATE 75 MG PO CAPS: 75.0000 mg | ORAL_CAPSULE | Freq: Every day | ORAL | 0 refills | Status: AC

## 2016-07-24 NOTE — Telephone Encounter (Signed)
Prescription for Tamiflu 75 mg daily for 7 days has been sent to pharmacy

## 2016-07-24 NOTE — Telephone Encounter (Signed)
Pt has been exposed to the flu twice this week (one being her granddaughter who was dx today). Pt requesting that you please prescribe something as a preventative. Pt is in the home with the granddaughter who has fever of 102. Please send prescription [email or web address]

## 2016-07-24 NOTE — Telephone Encounter (Signed)
Pt verbally informed.

## 2016-08-01 ENCOUNTER — Ambulatory Visit (INDEPENDENT_AMBULATORY_CARE_PROVIDER_SITE_OTHER): Payer: Medicare Other | Admitting: Family Medicine

## 2016-08-01 ENCOUNTER — Encounter: Payer: Self-pay | Admitting: Family Medicine

## 2016-08-01 VITALS — BP 120/70 | HR 95 | Temp 97.8°F | Resp 16 | Ht 66.0 in | Wt 140.4 lb

## 2016-08-01 DIAGNOSIS — J40 Bronchitis, not specified as acute or chronic: Secondary | ICD-10-CM

## 2016-08-01 MED ORDER — BENZONATATE 100 MG PO CAPS: 100.0000 mg | ORAL_CAPSULE | Freq: Three times a day (TID) | ORAL | 0 refills | Status: AC

## 2016-08-01 MED ORDER — ALBUTEROL SULFATE HFA 108 (90 BASE) MCG/ACT IN AERS: 2.0000 | INHALATION_SPRAY | Freq: Four times a day (QID) | RESPIRATORY_TRACT | 2 refills | Status: DC | PRN

## 2016-08-01 NOTE — Progress Notes (Signed)
Name: Christine Burnett   MRN: [id number]    DOB: 08/31/1940   Date:08/01/2016       Progress Note  Subjective  Chief Complaint  Chief Complaint  Patient presents with  . Cough    for 3 weeks  . Shortness of Breath    Cough  This is a new problem. The current episode started 1 to 4 weeks ago. The problem has been unchanged. The cough is productive of sputum. Associated symptoms include nasal congestion and shortness of breath (described as not being able to breathe deeply when she coughs.). Pertinent negatives include no chest pain, chills or fever. She has tried OTC cough suppressant (Robitussin DM, has tried an inhaler from her grandson) for the symptoms. Her past medical history is significant for pneumonia. There is no history of asthma, bronchitis, COPD or environmental allergies.     Past Medical History:  Diagnosis Date  . A-fib ([place])   . Allergic rhinitis   . Anxiety   . Chronic bronchitis ([place])   . CKD (chronic kidney disease), stage III   . Cornea scar   . Diverticulosis   . Hyperlipidemia   . Hypertension   . Osteoporosis   . Risk for falls   . Situational disturbance   . TIA (transient ischemic attack)     Past Surgical History:  Procedure Laterality Date  . ESOPHAGUS SURGERY      Family History  Problem Relation Age of Onset  . Hypertension Mother   . Heart attack Father     Social History   Social History  . Marital status: Divorced    Spouse name: N/A  . Number of children: N/A  . Years of education: N/A   Occupational History  . Not on file.   Social History Main Topics  . Smoking status: Current Every Day Smoker    Packs/day: 0.25    Years: 61.00    Types: Cigarettes  . Smokeless tobacco: Never Used  . Alcohol use No  . Drug use: No  . Sexual activity: No   Other Topics Concern  . Not on file   Social History Narrative  . No narrative on file     Current Outpatient Prescriptions:  .  ALPRAZolam (XANAX) 0.25 MG tablet, Take 1  tablet (0.25 mg total) by mouth at bedtime as needed for anxiety., Disp: 30 tablet, Rfl: 2 .  atenolol (TENORMIN) 25 MG tablet, Take 1 tablet (25 mg total) by mouth daily., Disp: 90 tablet, Rfl: 0 .  PARoxetine (PAXIL) 20 MG tablet, Take 1 tablet (20 mg total) by mouth daily., Disp: 90 tablet, Rfl: 0 .  pravastatin (PRAVACHOL) 20 MG tablet, Take 1 tablet (20 mg total) by mouth daily., Disp: 90 tablet, Rfl: 3 .  warfarin (COUMADIN) 2 MG tablet, Take 1 tablet (2 mg total) by mouth daily at 6 PM., Disp: 90 tablet, Rfl: 3  Allergies  Allergen Reactions  . Cefuroxime Axetil     caused rash.  . Ciprofloxacin     caused rash.  . Codeine      Review of Systems  Constitutional: Negative for chills and fever.  Respiratory: Positive for cough and shortness of breath (described as not being able to breathe deeply when she coughs.).   Cardiovascular: Negative for chest pain.  Endo/Heme/Allergies: Negative for environmental allergies.    Objective  Vitals:   08/01/16 1441  BP: 120/70  Pulse: 95  Resp: 16  Temp: 97.8 F (36.6 C)  TempSrc: Oral  SpO2: 95%  Weight: 140 lb 6.4 oz (63.7 kg)  Height: '5\' 6"'$  (1.676 m)    Physical Exam  Constitutional: She is well-developed, well-nourished, and in no distress.  HENT:  Head: Normocephalic and atraumatic.  Nose: Right sinus exhibits no maxillary sinus tenderness. Left sinus exhibits no maxillary sinus tenderness.  Mouth/Throat: No oropharyngeal exudate or posterior oropharyngeal erythema.  Cardiovascular: Normal rate, regular rhythm, S1 normal, S2 normal and normal heart sounds.   No murmur heard. Pulmonary/Chest: Effort normal and breath sounds normal. No respiratory distress. She has no decreased breath sounds. She has no wheezes. She has no rhonchi.  Nursing note and vitals reviewed.    Recent Results (from the past 2160 hour(s))  POCT INR     Status: Abnormal   Collection Time: 05/11/16 10:32 AM  Result Value Ref Range   INR 1.8    POCT INR     Status: None   Collection Time: 05/15/16 12:00 AM  Result Value Ref Range   INR 2.9   POCT INR     Status: None   Collection Time: 06/12/16  3:20 PM  Result Value Ref Range   INR 2.1     Comment: 25.5 seconds  Protime-INR     Status: Abnormal   Collection Time: 06/12/16  3:20 PM  Result Value Ref Range   INR 2.1 (A) 0.9 - 1.1     Assessment & Plan  1. Bronchitis  - albuterol (PROVENTIL HFA;VENTOLIN HFA) 108 (90 Base) MCG/ACT inhaler; Inhale 2 puffs into the lungs every 6 (six) hours as needed for wheezing or shortness of breath.  Dispense: 1 Inhaler; Refill: 2 - benzonatate (TESSALON) 100 MG capsule; Take 1 capsule (100 mg total) by mouth 3 (three) times daily.  Dispense: 20 capsule; Refill: 0   [name] [facility] 08/01/2016 3:10 PM

## 2016-08-10 ENCOUNTER — Encounter: Payer: Self-pay | Admitting: Family Medicine

## 2016-08-10 ENCOUNTER — Ambulatory Visit (INDEPENDENT_AMBULATORY_CARE_PROVIDER_SITE_OTHER): Payer: Medicare Other | Admitting: Family Medicine

## 2016-08-10 VITALS — BP 120/77 | HR 87 | Temp 97.7°F | Resp 17 | Ht 66.0 in | Wt 145.5 lb

## 2016-08-10 DIAGNOSIS — E782 Mixed hyperlipidemia: Secondary | ICD-10-CM | POA: Diagnosis not present

## 2016-08-10 DIAGNOSIS — F419 Anxiety disorder, unspecified: Secondary | ICD-10-CM | POA: Diagnosis not present

## 2016-08-10 DIAGNOSIS — Z7901 Long term (current) use of anticoagulants: Secondary | ICD-10-CM

## 2016-08-10 DIAGNOSIS — Z5181 Encounter for therapeutic drug level monitoring: Secondary | ICD-10-CM | POA: Diagnosis not present

## 2016-08-10 DIAGNOSIS — I10 Essential (primary) hypertension: Secondary | ICD-10-CM | POA: Diagnosis not present

## 2016-08-10 LAB — LIPID PANEL
CHOL/HDL RATIO: 3.9 ratio (ref <5.0)
CHOLESTEROL: 183 mg/dL (ref <200)
HDL: 47 mg/dL — AB (ref >50)
LDL CALC: 93 mg/dL (ref <100)
TRIGLYCERIDES: 216 mg/dL — AB (ref <150)
VLDL: 43 mg/dL — AB (ref <30)

## 2016-08-10 LAB — POCT INR: INR: 2.3

## 2016-08-10 MED ORDER — PAROXETINE HCL 30 MG PO TABS: 30.0000 mg | ORAL_TABLET | Freq: Every day | ORAL | 0 refills | Status: DC

## 2016-08-10 MED ORDER — ATENOLOL 25 MG PO TABS: 25.0000 mg | ORAL_TABLET | Freq: Every day | ORAL | 0 refills | Status: DC

## 2016-08-10 NOTE — Progress Notes (Signed)
Name: Christine Burnett   MRN: [id number]    DOB: 02/04/1941   Date:08/10/2016       Progress Note  Subjective  Chief Complaint  Chief Complaint  Patient presents with  . Follow-up    3 mo    Hyperlipidemia  This is a chronic problem. The problem is controlled. Recent lipid tests were reviewed and are high (elevated triglycerides in September 2017). Pertinent negatives include no chest pain, leg pain or shortness of breath. Current antihyperlipidemic treatment includes statins. The current treatment provides significant improvement of lipids.  Hypertension  This is a chronic problem. The problem is unchanged. The problem is controlled. Pertinent negatives include no anxiety, blurred vision, chest pain, headaches, palpitations or shortness of breath. Past treatments include beta blockers. The current treatment provides significant improvement. 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  Anxiety  Presents for follow-up visit. The problem has been unchanged. Symptoms include decreased concentration, depressed mood, excessive worry and nervous/anxious behavior. Patient reports no chest pain, dizziness, insomnia, irritability, palpitations or shortness of breath. Primary symptoms comment: worse recently due to her daughter living in her yard after being evicted from her apartment, living with 4 teenagers in her house. Symptoms occur most days. The severity of symptoms is moderate.   Her past medical history is significant for anxiety/panic attacks. There is no history of depression. Past treatments include SSRIs. The treatment provided significant relief. Compliance with prior treatments has been good.     Past Medical History:  Diagnosis Date  . A-fib ([place])   . Allergic rhinitis   . Anxiety   . Chronic bronchitis ([place])   . CKD (chronic kidney disease), stage III   . Cornea scar   . Diverticulosis   . Hyperlipidemia   . Hypertension   . Osteoporosis   . Risk for falls    . Situational disturbance   . TIA (transient ischemic attack)     Past Surgical History:  Procedure Laterality Date  . ESOPHAGUS SURGERY      Family History  Problem Relation Age of Onset  . Hypertension Mother   . Heart attack Father     Social History   Social History  . Marital status: Divorced    Spouse name: N/A  . Number of children: N/A  . Years of education: N/A   Occupational History  . Not on file.   Social History Main Topics  . Smoking status: Current Every Day Smoker    Packs/day: 0.25    Years: 61.00    Types: Cigarettes  . Smokeless tobacco: Never Used  . Alcohol use No  . Drug use: No  . Sexual activity: No   Other Topics Concern  . Not on file   Social History Narrative  . No narrative on file     Current Outpatient Prescriptions:  .  albuterol (PROVENTIL HFA;VENTOLIN HFA) 108 (90 Base) MCG/ACT inhaler, Inhale 2 puffs into the lungs every 6 (six) hours as needed for wheezing or shortness of breath., Disp: 1 Inhaler, Rfl: 2 .  ALPRAZolam (XANAX) 0.25 MG tablet, Take 1 tablet (0.25 mg total) by mouth at bedtime as needed for anxiety., Disp: 30 tablet, Rfl: 2 .  atenolol (TENORMIN) 25 MG tablet, Take 1 tablet (25 mg total) by mouth daily., Disp: 90 tablet, Rfl: 0 .  PARoxetine (PAXIL) 20 MG tablet, Take 1 tablet (20 mg total) by mouth daily., Disp: 90 tablet, Rfl: 0 .  pravastatin (PRAVACHOL) 20 MG tablet, Take 1 tablet (  20 mg total) by mouth daily., Disp: 90 tablet, Rfl: 3 .  warfarin (COUMADIN) 2 MG tablet, Take 1 tablet (2 mg total) by mouth daily at 6 PM., Disp: 90 tablet, Rfl: 3  Allergies  Allergen Reactions  . Cefuroxime Axetil     caused rash.  . Ciprofloxacin     caused rash.  . Codeine      Review of Systems  Constitutional: Negative for irritability.  Eyes: Negative for blurred vision.  Respiratory: Negative for shortness of breath.   Cardiovascular: Negative for chest pain and palpitations.  Neurological: Negative for  dizziness and headaches.  Psychiatric/Behavioral: Positive for decreased concentration. The patient is nervous/anxious. The patient does not have insomnia.     Objective  Vitals:   08/10/16 1117  BP: 120/77  Pulse: 87  Resp: 17  Temp: 97.7 F (36.5 C)  TempSrc: Oral  SpO2: 94%  Weight: 145 lb 8 oz (66 kg)  Height: '5\' 6"'$  (1.676 m)    Physical Exam  Constitutional: She is oriented to person, place, and time and well-developed, well-nourished, and in no distress.  HENT:  Head: Normocephalic and atraumatic.  Cardiovascular: Normal rate, regular rhythm and normal heart sounds.   No murmur heard. Pulmonary/Chest: Effort normal and breath sounds normal. No respiratory distress. She has no wheezes.  Abdominal: Soft. Bowel sounds are normal. There is no tenderness.  Neurological: She is alert and oriented to person, place, and time.  Psychiatric: Mood, memory, affect and judgment normal.  Nursing note and vitals reviewed.    Recent Results (from the past 2160 hour(s))  POCT INR     Status: None   Collection Time: 05/15/16 12:00 AM  Result Value Ref Range   INR 2.9   POCT INR     Status: None   Collection Time: 06/12/16  3:20 PM  Result Value Ref Range   INR 2.1     Comment: 25.5 seconds  Protime-INR     Status: Abnormal   Collection Time: 06/12/16  3:20 PM  Result Value Ref Range   INR 2.1 (A) 0.9 - 1.1     Assessment & Plan  1. Essential hypertension Stable continue on atenolol - atenolol (TENORMIN) 25 MG tablet; Take 1 tablet (25 mg total) by mouth daily.  Dispense: 90 tablet; Refill: 0  2. Anxiety Patient feels overwhelmed and stressed out with her daughter's living situation, also feels overwhelmed dealing with teenagers. We'll increase Paxil to 30 mg - PARoxetine (PAXIL) 30 MG tablet; Take 1 tablet (30 mg total) by mouth daily.  Dispense: 90 tablet; Refill: 0  3. Mixed hyperlipidemia  - Lipid panel  4. Anticoagulation goal of INR 2 to 3 Point of care INR  is 2.3, and considered therapeutic - POCT INR  Media Pizzini [facility] 08/10/2016 11:34 AM

## 2016-08-18 ENCOUNTER — Other Ambulatory Visit: Payer: Self-pay | Admitting: Emergency Medicine

## 2016-08-18 DIAGNOSIS — E781 Pure hyperglyceridemia: Secondary | ICD-10-CM

## 2016-08-18 MED ORDER — PRAVASTATIN SODIUM 40 MG PO TABS: 40.0000 mg | ORAL_TABLET | Freq: Every day | ORAL | 2 refills | Status: DC

## 2016-08-24 ENCOUNTER — Telehealth: Payer: Self-pay

## 2016-08-24 NOTE — Telephone Encounter (Signed)
Called to schedule AWV, no answer, left message, ANR

## 2016-08-28 DIAGNOSIS — L821 Other seborrheic keratosis: Secondary | ICD-10-CM | POA: Diagnosis not present

## 2016-10-03 DIAGNOSIS — I10 Essential (primary) hypertension: Secondary | ICD-10-CM | POA: Diagnosis not present

## 2016-10-03 DIAGNOSIS — R3121 Asymptomatic microscopic hematuria: Secondary | ICD-10-CM | POA: Diagnosis not present

## 2016-10-03 DIAGNOSIS — R3129 Other microscopic hematuria: Secondary | ICD-10-CM | POA: Diagnosis not present

## 2016-10-03 DIAGNOSIS — N183 Chronic kidney disease, stage 3 (moderate): Secondary | ICD-10-CM | POA: Diagnosis not present

## 2016-10-03 DIAGNOSIS — E7800 Pure hypercholesterolemia, unspecified: Secondary | ICD-10-CM | POA: Diagnosis not present

## 2016-10-04 ENCOUNTER — Other Ambulatory Visit: Payer: Self-pay | Admitting: Family Medicine

## 2016-10-04 DIAGNOSIS — E781 Pure hyperglyceridemia: Secondary | ICD-10-CM

## 2016-10-17 ENCOUNTER — Ambulatory Visit (INDEPENDENT_AMBULATORY_CARE_PROVIDER_SITE_OTHER): Payer: Medicare Other

## 2016-10-17 DIAGNOSIS — Z5181 Encounter for therapeutic drug level monitoring: Secondary | ICD-10-CM | POA: Diagnosis not present

## 2016-10-17 DIAGNOSIS — N183 Chronic kidney disease, stage 3 (moderate): Secondary | ICD-10-CM | POA: Diagnosis not present

## 2016-10-17 LAB — POCT INR: INR: 2.7

## 2016-10-17 NOTE — Progress Notes (Signed)
Patient has been instructed to continue on present dosage and return in one month

## 2016-10-17 NOTE — Patient Instructions (Signed)
Patient has been instructed to continue on present dosage and return in one month

## 2016-11-10 ENCOUNTER — Ambulatory Visit (INDEPENDENT_AMBULATORY_CARE_PROVIDER_SITE_OTHER): Payer: Medicare Other | Admitting: Family Medicine

## 2016-11-10 ENCOUNTER — Encounter: Payer: Self-pay | Admitting: Family Medicine

## 2016-11-10 DIAGNOSIS — F419 Anxiety disorder, unspecified: Secondary | ICD-10-CM

## 2016-11-10 DIAGNOSIS — I10 Essential (primary) hypertension: Secondary | ICD-10-CM | POA: Diagnosis not present

## 2016-11-10 DIAGNOSIS — E781 Pure hyperglyceridemia: Secondary | ICD-10-CM

## 2016-11-10 MED ORDER — ATENOLOL 25 MG PO TABS: 25.0000 mg | ORAL_TABLET | Freq: Every day | ORAL | 0 refills | Status: DC

## 2016-11-10 MED ORDER — PRAVASTATIN SODIUM 40 MG PO TABS: 40.0000 mg | ORAL_TABLET | Freq: Every day | ORAL | 0 refills | Status: DC

## 2016-11-10 MED ORDER — PAROXETINE HCL 30 MG PO TABS: 30.0000 mg | ORAL_TABLET | Freq: Every day | ORAL | 0 refills | Status: DC

## 2016-11-10 NOTE — Progress Notes (Signed)
Name: Christine Burnett   MRN: [id number]    DOB: 1940-07-03   Date:11/10/2016       Progress Note  Subjective  Chief Complaint  Chief Complaint  Patient presents with  . Medication Refill    3 month F/U  . Hyperlipidemia  . Hypertension  . Anxiety    Hyperlipidemia  This is a chronic problem. The problem is uncontrolled. Recent lipid tests were reviewed and are high. Pertinent negatives include no chest pain, leg pain, myalgias or shortness of breath. Current antihyperlipidemic treatment includes statins. Risk factors for coronary artery disease include dyslipidemia.  Hypertension  This is a chronic problem. The problem is unchanged. The problem is controlled. Associated symptoms include anxiety. Pertinent negatives include no blurred vision, chest pain, headaches, palpitations or shortness of breath. Past treatments include beta blockers. Hypertensive end-organ damage includes CVA. There is no history of kidney disease or CAD/MI.  Anxiety  Presents for follow-up visit. Symptoms include depressed mood. Patient reports no chest pain, decreased concentration, excessive worry, insomnia, malaise, nervous/anxious behavior, palpitations or shortness of breath. The severity of symptoms is moderate and causing significant distress (main source of stress is taking care of her grandchildren).      Past Medical History:  Diagnosis Date  . A-fib ([place])   . Allergic rhinitis   . Anxiety   . Chronic bronchitis ([place])   . CKD (chronic kidney disease), stage III   . Cornea scar   . Diverticulosis   . Hyperlipidemia   . Hypertension   . Osteoporosis   . Risk for falls   . Situational disturbance   . TIA (transient ischemic attack)     Past Surgical History:  Procedure Laterality Date  . ESOPHAGUS SURGERY      Family History  Problem Relation Age of Onset  . Hypertension Mother   . Heart attack Father     Social History   Social History  . Marital status: Divorced    Spouse name:  N/A  . Number of children: N/A  . Years of education: N/A   Occupational History  . Not on file.   Social History Main Topics  . Smoking status: Current Every Day Smoker    Packs/day: 0.25    Years: 61.00    Types: Cigarettes  . Smokeless tobacco: Never Used  . Alcohol use No  . Drug use: No  . Sexual activity: No   Other Topics Concern  . Not on file   Social History Narrative  . No narrative on file     Current Outpatient Prescriptions:  .  atenolol (TENORMIN) 25 MG tablet, Take 1 tablet (25 mg total) by mouth daily., Disp: 90 tablet, Rfl: 0 .  PARoxetine (PAXIL) 30 MG tablet, Take 1 tablet (30 mg total) by mouth daily., Disp: 90 tablet, Rfl: 0 .  pravastatin (PRAVACHOL) 40 MG tablet, Take 1 tablet (40 mg total) by mouth daily. Please put on file. Patient will call when finishing what she have., Disp: 30 tablet, Rfl: 2 .  warfarin (COUMADIN) 2 MG tablet, Take 1 tablet (2 mg total) by mouth daily at 6 PM., Disp: 90 tablet, Rfl: 3 .  albuterol (PROVENTIL HFA;VENTOLIN HFA) 108 (90 Base) MCG/ACT inhaler, Inhale 2 puffs into the lungs every 6 (six) hours as needed for wheezing or shortness of breath. (Patient not taking: Reported on 11/10/2016), Disp: 1 Inhaler, Rfl: 2 .  ALPRAZolam (XANAX) 0.25 MG tablet, Take 1 tablet (0.25 mg total) by mouth at bedtime as  needed for anxiety. (Patient not taking: Reported on 11/10/2016), Disp: 30 tablet, Rfl: 2  Allergies  Allergen Reactions  . Cefuroxime Axetil     caused rash.  . Ciprofloxacin     caused rash.  . Codeine      Review of Systems  Eyes: Negative for blurred vision.  Respiratory: Negative for shortness of breath.   Cardiovascular: Negative for chest pain and palpitations.  Musculoskeletal: Negative for myalgias.  Neurological: Negative for headaches.  Psychiatric/Behavioral: Negative for decreased concentration. The patient is not nervous/anxious and does not have insomnia.      Objective  Vitals:   11/10/16 1437   BP: 118/74  Pulse: 91  Resp: 18  Temp: 97.9 F (36.6 C)  TempSrc: Oral  SpO2: 99%  Weight: 142 lb 8 oz (64.6 kg)  Height: 5\' 6"  (1.676 m)    Physical Exam  Constitutional: She is oriented to person, place, and time and well-developed, well-nourished, and in no distress.  HENT:  Head: Normocephalic and atraumatic.  Cardiovascular: Normal rate, regular rhythm and normal heart sounds.   No murmur heard. Pulmonary/Chest: Effort normal and breath sounds normal. No respiratory distress. She has no wheezes.  Abdominal: Soft. Bowel sounds are normal. There is no tenderness.  Neurological: She is alert and oriented to person, place, and time.  Psychiatric: Mood, memory, affect and judgment normal.  Nursing note and vitals reviewed.      Recent Results (from the past 2160 hour(s))  POCT INR     Status: None   Collection Time: 10/17/16  3:20 PM  Result Value Ref Range   INR 2.7      Assessment & Plan  1. Anxiety Stable, has not taken alprazolam but takes fluoxetine every day, refills provided - PARoxetine (PAXIL) 30 MG tablet; Take 1 tablet (30 mg total) by mouth daily.  Dispense: 90 tablet; Refill: 0  2. Essential hypertension BP stable on present antihypertensive therapy - atenolol (TENORMIN) 25 MG tablet; Take 1 tablet (25 mg total) by mouth daily.  Dispense: 90 tablet; Refill: 0  3. Hypertriglyceridemia  - pravastatin (PRAVACHOL) 40 MG tablet; Take 1 tablet (40 mg total) by mouth daily. Please put on file. Patient will call when finishing what she have.  Dispense: 90 tablet; Refill: 0 - Lipid panel   Rodriques Badie [facility] 11/10/2016 3:12 PM

## 2016-11-11 LAB — LIPID PANEL
CHOL/HDL RATIO: 4 ratio (ref <5.0)
CHOLESTEROL: 161 mg/dL (ref <200)
HDL: 40 mg/dL — ABNORMAL LOW (ref >50)
LDL CALC: 84 mg/dL (ref <100)
Triglycerides: 183 mg/dL — ABNORMAL HIGH (ref <150)
VLDL: 37 mg/dL — AB (ref <30)

## 2016-12-25 ENCOUNTER — Ambulatory Visit (INDEPENDENT_AMBULATORY_CARE_PROVIDER_SITE_OTHER): Payer: Medicare Other

## 2016-12-25 VITALS — BP 132/62 | HR 60 | Temp 97.9°F | Ht 66.0 in | Wt 141.0 lb

## 2016-12-25 DIAGNOSIS — Z0000 Encounter for general adult medical examination without abnormal findings: Secondary | ICD-10-CM

## 2016-12-25 NOTE — Patient Instructions (Signed)
Christine Burnett , Thank you for taking time to come for your Medicare Wellness Visit. I appreciate your ongoing commitment to your health goals. Please review the following plan we discussed and let me know if I can assist you in the future.   Screening recommendations/referrals: Colonoscopy: completed 10/07/12 Mammogram: pt to schedule within the next year Bone Density: completed 11/01/11 Recommended yearly ophthalmology/optometry visit for glaucoma screening and checkup Recommended yearly dental visit for hygiene and checkup  Vaccinations: Influenza vaccine: due 02/2017 Pneumococcal vaccine: declined Tdap vaccine: declined Shingles vaccine: declined   Advanced directives: Please bring a copy of your POA (Power of Attorney) and/or Living Will to your next appointment.   Conditions/risks identified: Smoking cessation  Next appointment: None, need to schedule follow up with PCP and 1 year AWV.   Preventive Care 77 Years and Older, Female Preventive care refers to lifestyle choices and visits with your health care provider that can promote health and wellness. What does preventive care include?  A yearly physical exam. This is also called an annual well check.  Dental exams once or twice a year.  Routine eye exams. Ask your health care provider how often you should have your eyes checked.  Personal lifestyle choices, including:  Daily care of your teeth and gums.  Regular physical activity.  Eating a healthy diet.  Avoiding tobacco and drug use.  Limiting alcohol use.  Practicing safe sex.  Taking low-dose aspirin every day.  Taking vitamin and mineral supplements as recommended by your health care provider. What happens during an annual well check? The services and screenings done by your health care provider during your annual well check will depend on your age, overall health, lifestyle risk factors, and family history of disease. Counseling  Your health care provider  may ask you questions about your:  Alcohol use.  Tobacco use.  Drug use.  Emotional well-being.  Home and relationship well-being.  Sexual activity.  Eating habits.  History of falls.  Memory and ability to understand (cognition).  Work and work Statistician.  Reproductive health. Screening  You may have the following tests or measurements:  Height, weight, and BMI.  Blood pressure.  Lipid and cholesterol levels. These may be checked every 5 years, or more frequently if you are over 39 years old.  Skin check.  Lung cancer screening. You may have this screening every year starting at age 77 if you have a 30-pack-year history of smoking and currently smoke or have quit within the past 15 years.  Fecal occult blood test (FOBT) of the stool. You may have this test every year starting at age 77.  Flexible sigmoidoscopy or colonoscopy. You may have a sigmoidoscopy every 5 years or a colonoscopy every 10 years starting at age 77.  Hepatitis C blood test.  Hepatitis B blood test.  Sexually transmitted disease (STD) testing.  Diabetes screening. This is done by checking your blood sugar (glucose) after you have not eaten for a while (fasting). You may have this done every 1-3 years.  Bone density scan. This is done to screen for osteoporosis. You may have this done starting at age 77.  Mammogram. This may be done every 1-2 years. Talk to your health care provider about how often you should have regular mammograms. Talk with your health care provider about your test results, treatment options, and if necessary, the need for more tests. Vaccines  Your health care provider may recommend certain vaccines, such as:  Influenza vaccine. This is recommended  every year.  Tetanus, diphtheria, and acellular pertussis (Tdap, Td) vaccine. You may need a Td booster every 10 years.  Zoster vaccine. You may need this after age 43.  Pneumococcal 13-valent conjugate (PCV13) vaccine.  One dose is recommended after age 77.  Pneumococcal polysaccharide (PPSV23) vaccine. One dose is recommended after age 77. Talk to your health care provider about which screenings and vaccines you need and how often you need them. This information is not intended to replace advice given to you by your health care provider. Make sure you discuss any questions you have with your health care provider. Document Released: 06/18/2015 Document Revised: 02/09/2016 Document Reviewed: 03/23/2015 Elsevier Interactive Patient Education  2017 [employer] Prevention in the Home Falls can cause injuries. They can happen to people of all ages. There are many things you can do to make your home safe and to help prevent falls. What can I do on the outside of my home?  Regularly fix the edges of walkways and driveways and fix any cracks.  Remove anything that might make you trip as you walk through a door, such as a raised step or threshold.  Trim any bushes or trees on the path to your home.  Use bright outdoor lighting.  Clear any walking paths of anything that might make someone trip, such as rocks or tools.  Regularly check to see if handrails are loose or broken. Make sure that both sides of any steps have handrails.  Any raised decks and porches should have guardrails on the edges.  Have any leaves, snow, or ice cleared regularly.  Use sand or salt on walking paths during winter.  Clean up any spills in your garage right away. This includes oil or grease spills. What can I do in the bathroom?  Use night lights.  Install grab bars by the toilet and in the tub and shower. Do not use towel bars as grab bars.  Use non-skid mats or decals in the tub or shower.  If you need to sit down in the shower, use a plastic, non-slip stool.  Keep the floor dry. Clean up any water that spills on the floor as soon as it happens.  Remove soap buildup in the tub or shower regularly.  Attach bath  mats securely with double-sided non-slip rug tape.  Do not have throw rugs and other things on the floor that can make you trip. What can I do in the bedroom?  Use night lights.  Make sure that you have a light by your bed that is easy to reach.  Do not use any sheets or blankets that are too big for your bed. They should not hang down onto the floor.  Have a firm chair that has side arms. You can use this for support while you get dressed.  Do not have throw rugs and other things on the floor that can make you trip. What can I do in the kitchen?  Clean up any spills right away.  Avoid walking on wet floors.  Keep items that you use a lot in easy-to-reach places.  If you need to reach something above you, use a strong step stool that has a grab bar.  Keep electrical cords out of the way.  Do not use floor polish or wax that makes floors slippery. If you must use wax, use non-skid floor wax.  Do not have throw rugs and other things on the floor that can make you trip. What  can I do with my stairs?  Do not leave any items on the stairs.  Make sure that there are handrails on both sides of the stairs and use them. Fix handrails that are broken or loose. Make sure that handrails are as long as the stairways.  Check any carpeting to make sure that it is firmly attached to the stairs. Fix any carpet that is loose or worn.  Avoid having throw rugs at the top or bottom of the stairs. If you do have throw rugs, attach them to the floor with carpet tape.  Make sure that you have a light switch at the top of the stairs and the bottom of the stairs. If you do not have them, ask someone to add them for you. What else can I do to help prevent falls?  Wear shoes that:  Do not have high heels.  Have rubber bottoms.  Are comfortable and fit you well.  Are closed at the toe. Do not wear sandals.  If you use a stepladder:  Make sure that it is fully opened. Do not climb a closed  stepladder.  Make sure that both sides of the stepladder are locked into place.  Ask someone to hold it for you, if possible.  Clearly mark and make sure that you can see:  Any grab bars or handrails.  First and last steps.  Where the edge of each step is.  Use tools that help you move around (mobility aids) if they are needed. These include:  Canes.  Walkers.  Scooters.  Crutches.  Turn on the lights when you go into a dark area. Replace any light bulbs as soon as they burn out.  Set up your furniture so you have a clear path. Avoid moving your furniture around.  If any of your floors are uneven, fix them.  If there are any pets around you, be aware of where they are.  Review your medicines with your doctor. Some medicines can make you feel dizzy. This can increase your chance of falling. Ask your doctor what other things that you can do to help prevent falls. This information is not intended to replace advice given to you by your health care provider. Make sure you discuss any questions you have with your health care provider. Document Released: 03/18/2009 Document Revised: 10/28/2015 Document Reviewed: 06/26/2014 Elsevier Interactive Patient Education  2017 [employer].

## 2016-12-25 NOTE — Progress Notes (Addendum)
Subjective:   Christine Burnett is a 76 y.o. female who presents for Medicare Annual (Subsequent) preventive examination.  Review of Systems:  N/A  Cardiac Risk Factors include: advanced age (>70men, >26 women);dyslipidemia;hypertension;smoking/ tobacco exposure     Objective:     Vitals: BP 132/62 (BP Location: Left Arm)   Pulse 60   Temp 97.9 F (36.6 C) (Oral)   Ht 5\' 6"  (1.676 m)   Wt 141 lb (64 kg)   BMI 22.76 kg/m   Body mass index is 22.76 kg/m.   Tobacco History  Smoking Status  . Current Every Day Smoker  . Packs/day: 0.25  . Years: 61.00  . Types: Cigarettes  Smokeless Tobacco  . Never Used     Ready to quit: Yes Counseling given: No   Past Medical History:  Diagnosis Date  . A-fib ([place])   . Allergic rhinitis   . Anxiety   . Chronic bronchitis ([place])   . CKD (chronic kidney disease), stage III   . Cornea scar   . Diverticulosis   . Hyperlipidemia   . Hypertension   . Osteoporosis   . Risk for falls   . Situational disturbance   . TIA (transient ischemic attack)    Past Surgical History:  Procedure Laterality Date  . ESOPHAGUS SURGERY     Family History  Problem Relation Age of Onset  . Hypertension Mother   . Heart attack Father    History  Sexual Activity  . Sexual activity: No    Outpatient Encounter Prescriptions as of 12/25/2016  Medication Sig  . ALPRAZolam (XANAX) 0.25 MG tablet Take 1 tablet (0.25 mg total) by mouth at bedtime as needed for anxiety.  Marland Kitchen atenolol (TENORMIN) 25 MG tablet Take 1 tablet (25 mg total) by mouth daily.  Marland Kitchen PARoxetine (PAXIL) 30 MG tablet Take 1 tablet (30 mg total) by mouth daily.  . pravastatin (PRAVACHOL) 40 MG tablet Take 1 tablet (40 mg total) by mouth daily. Please put on file. Patient will call when finishing what she have.  . warfarin (COUMADIN) 2 MG tablet Take 1 tablet (2 mg total) by mouth daily at 6 PM.  . albuterol (PROVENTIL HFA;VENTOLIN HFA) 108 (90 Base) MCG/ACT inhaler Inhale 2 puffs  into the lungs every 6 (six) hours as needed for wheezing or shortness of breath. (Patient not taking: Reported on 11/10/2016)   No facility-administered encounter medications on file as of 12/25/2016.     Activities of Daily Living In your present state of health, do you have any difficulty performing the following activities: 12/25/2016 11/10/2016  Hearing? Y N  Vision? N N  Difficulty concentrating or making decisions? Y N  Walking or climbing stairs? Y N  Dressing or bathing? N N  Doing errands, shopping? N N  Preparing Food and eating ? N -  Using the Toilet? N -  In the past six months, have you accidently leaked urine? Y -  Do you have problems with loss of bowel control? Y -  Managing your Medications? N -  Managing your Finances? N -  Housekeeping or managing your Housekeeping? N -  Some recent data might be hidden    Patient Care Team: Roselee Nova, MD as PCP - General (Family Medicine)    Assessment:     Exercise Activities and Dietary recommendations Current Exercise Habits: The patient does not participate in regular exercise at present, Exercise limited by: None identified  Goals    . Quit smoking /  using tobacco          Recommend to quit smoking. Pt to taper down 1 cigarette a day to 0 cigarettes.       Fall Risk Fall Risk  12/25/2016 11/10/2016 08/10/2016 05/11/2016 02/10/2016  Falls in the past year? No No No No Yes  Number falls in past yr: - - - - 1  Injury with Fall? - - - - No   Depression Screen PHQ 2/9 Scores 12/25/2016 11/10/2016 08/10/2016 05/11/2016  PHQ - 2 Score 1 0 0 0     Cognitive Function     6CIT Screen 12/25/2016  What Year? 0 points  What month? 0 points  What time? 0 points  Count back from 20 0 points  Months in reverse 0 points  Repeat phrase 6 points  Total Score 6    Immunization History  Administered Date(s) Administered  . Influenza Split 03/15/2010, 03/21/2011  . Influenza, High Dose Seasonal PF 04/07/2015, 03/22/2016  .  Influenza,inj,Quad PF,36+ Mos 03/19/2013, 03/10/2014   Screening Tests Health Maintenance  Topic Date Due  . TETANUS/TDAP  02/25/1959  . PNA vac Low Risk Adult (1 of 2 - PCV13) 02/24/2005  . INFLUENZA VACCINE  01/03/2017  . DEXA SCAN  Completed      Plan:  I have personally reviewed and addressed the Medicare Annual Wellness questionnaire and have noted the following in the patient's chart:  A. Medical and social history B. Use of alcohol, tobacco or illicit drugs  C. Current medications and supplements D. Functional ability and status E.  Nutritional status F.  Physical activity G. Advance directives H. List of other physicians I.  Hospitalizations, surgeries, and ER visits in previous 12 months J.  [place] such as hearing and vision if needed, cognitive and depression L. Referrals and appointments - none  In addition, I have reviewed and discussed with patient certain preventive protocols, quality metrics, and best practice recommendations. A written personalized care plan for preventive services as well as general preventive health recommendations were provided to patient.  See attached scanned questionnaire for additional information.   Signed,  Fabio Neighbors, LPN Nurse Health Advisor   MD Recommendations: None. Pt declined pneumonia and tetanus vaccines today.  I, as supervising physician, have reviewed the nurse health advisor's Medicare Wellness Visit note for this patient and concur with the findings and recommendations listed above.  Signed Syed Asad A. Manuella Ghazi MD Attending Physician.

## 2017-01-16 ENCOUNTER — Encounter: Payer: Self-pay | Admitting: Family Medicine

## 2017-01-16 ENCOUNTER — Ambulatory Visit (INDEPENDENT_AMBULATORY_CARE_PROVIDER_SITE_OTHER): Payer: Medicare Other | Admitting: Family Medicine

## 2017-01-16 VITALS — BP 133/63 | HR 88 | Temp 98.0°F | Resp 16 | Ht 66.0 in | Wt 141.0 lb

## 2017-01-16 DIAGNOSIS — Z5181 Encounter for therapeutic drug level monitoring: Secondary | ICD-10-CM

## 2017-01-16 DIAGNOSIS — Z7901 Long term (current) use of anticoagulants: Secondary | ICD-10-CM

## 2017-01-16 DIAGNOSIS — R0989 Other specified symptoms and signs involving the circulatory and respiratory systems: Secondary | ICD-10-CM | POA: Diagnosis not present

## 2017-01-16 LAB — POCT INR: INR: 3

## 2017-01-16 MED ORDER — WARFARIN SODIUM 2 MG PO TABS: 2.0000 mg | ORAL_TABLET | Freq: Every day | ORAL | 3 refills | Status: DC

## 2017-01-16 NOTE — Progress Notes (Signed)
Name: Christine Burnett   MRN: [id number]    DOB: 09-04-1940   Date:01/16/2017       Progress Note  Subjective  Chief Complaint  Chief Complaint  Patient presents with  . Follow-up    PT/INR  . Anticoagulation  . Hyperlipidemia  . Nasal Congestion    x1 week    HPI  Anticoagulation: Patient is on anticoagulation for history of TIA, she takes Warfarin 2 mg daily, INR today is 3.0 which is on the upper limit of normal (goal INR 2-3), she reports no episodes of bleeding (no hemoptysis, hematuria, hematochezia, or bruising).  Chest Congestion: Patient has 1 week history of congestion in the chest, described as thick secretions that come up to her chest and throat but then she has difficulty expectorating. She denies any fevers, chills, or dyspnea, believes it to be a result of air conditioner in her car. She has taken Robitussin with some relief.    Past Medical History:  Diagnosis Date  . A-fib ([place])   . Allergic rhinitis   . Anxiety   . Chronic bronchitis ([place])   . CKD (chronic kidney disease), stage III   . Cornea scar   . Diverticulosis   . Hyperlipidemia   . Hypertension   . Osteoporosis   . Risk for falls   . Situational disturbance   . TIA (transient ischemic attack)     Past Surgical History:  Procedure Laterality Date  . ESOPHAGUS SURGERY      Family History  Problem Relation Age of Onset  . Hypertension Mother   . Heart attack Father     Social History   Social History  . Marital status: Divorced    Spouse name: N/A  . Number of children: N/A  . Years of education: N/A   Occupational History  . Not on file.   Social History Main Topics  . Smoking status: Current Every Day Smoker    Packs/day: 0.25    Years: 61.00    Types: Cigarettes  . Smokeless tobacco: Never Used  . Alcohol use No  . Drug use: No  . Sexual activity: No   Other Topics Concern  . Not on file   Social History Narrative  . No narrative on file     Current  Outpatient Prescriptions:  .  albuterol (PROVENTIL HFA;VENTOLIN HFA) 108 (90 Base) MCG/ACT inhaler, Inhale 2 puffs into the lungs every 6 (six) hours as needed for wheezing or shortness of breath., Disp: 1 Inhaler, Rfl: 2 .  ALPRAZolam (XANAX) 0.25 MG tablet, Take 1 tablet (0.25 mg total) by mouth at bedtime as needed for anxiety., Disp: 30 tablet, Rfl: 2 .  atenolol (TENORMIN) 25 MG tablet, Take 1 tablet (25 mg total) by mouth daily., Disp: 90 tablet, Rfl: 0 .  PARoxetine (PAXIL) 30 MG tablet, Take 1 tablet (30 mg total) by mouth daily., Disp: 90 tablet, Rfl: 0 .  pravastatin (PRAVACHOL) 40 MG tablet, Take 1 tablet (40 mg total) by mouth daily. Please put on file. Patient will call when finishing what she have., Disp: 90 tablet, Rfl: 0 .  warfarin (COUMADIN) 2 MG tablet, Take 1 tablet (2 mg total) by mouth daily at 6 PM., Disp: 90 tablet, Rfl: 3  Allergies  Allergen Reactions  . Cefuroxime Axetil     caused rash.  . Ciprofloxacin     caused rash.  . Codeine      ROS  Please see history of present illness for complete discussion  of ROS  Objective  Vitals:   01/16/17 1129  BP: 133/63  Pulse: 88  Resp: 16  Temp: 98 F (36.7 C)  TempSrc: Oral  SpO2: 94%  Weight: 141 lb (64 kg)  Height: 5\' 6"  (1.676 m)    Physical Exam  Constitutional: She is well-developed, well-nourished, and in no distress.  HENT:  Head: Normocephalic and atraumatic.  Right Ear: Tympanic membrane and ear canal normal.  Left Ear: Tympanic membrane and ear canal normal.  Mouth/Throat: No posterior oropharyngeal erythema.  Neck: Neck supple.  Cardiovascular: Normal rate, regular rhythm, S1 normal, S2 normal and normal heart sounds.   No murmur heard. Pulmonary/Chest: Effort normal and breath sounds normal. She has no wheezes.  Nursing note and vitals reviewed.     Recent Results (from the past 2160 hour(s))  Lipid panel     Status: Abnormal   Collection Time: 11/10/16  3:31 PM  Result Value Ref  Range   Cholesterol 161 <200 mg/dL   Triglycerides 183 (H) <150 mg/dL   HDL 40 (L) >50 mg/dL   Total CHOL/HDL Ratio 4.0 <5.0 Ratio   VLDL 37 (H) <30 mg/dL   LDL Cholesterol 84 <100 mg/dL  POCT INR     Status: Normal   Collection Time: 01/16/17 11:43 AM  Result Value Ref Range   INR 3.0      Assessment & Plan  1. Anticoagulation goal of INR 2 to 3 INR at the upper limit of normal at 3.0, no change in anticoagulation management, no evidence of bleeding, repeat in one month.  - POCT INR - warfarin (COUMADIN) 2 MG tablet; Take 1 tablet (2 mg total) by mouth daily at 6 PM.  Dispense: 90 tablet; Refill: 3  2. Chest congestion Likely because of temperature variation from hot to cold versus sinus drainage, advised to use plain Mucinex for relief, no antibiotics indicated at this stage   Octavie Westerhold Asad A. [facility] 01/16/2017 11:43 AM

## 2017-01-23 DIAGNOSIS — N2581 Secondary hyperparathyroidism of renal origin: Secondary | ICD-10-CM | POA: Diagnosis not present

## 2017-01-23 DIAGNOSIS — N183 Chronic kidney disease, stage 3 (moderate): Secondary | ICD-10-CM | POA: Diagnosis not present

## 2017-01-23 DIAGNOSIS — R3129 Other microscopic hematuria: Secondary | ICD-10-CM | POA: Diagnosis not present

## 2017-01-23 DIAGNOSIS — I10 Essential (primary) hypertension: Secondary | ICD-10-CM | POA: Diagnosis not present

## 2017-02-01 ENCOUNTER — Other Ambulatory Visit: Payer: Self-pay | Admitting: *Deleted

## 2017-02-01 DIAGNOSIS — R3129 Other microscopic hematuria: Secondary | ICD-10-CM

## 2017-02-01 NOTE — Progress Notes (Signed)
02/02/2017 9:30 AM   Christine Burnett [birth year] [id number]  Referring provider: Anthonette Legato, MD [place], [place] [address]  Chief Complaint  Patient presents with  . New Patient (Initial Visit)    Hematruia referred by Dr. Carlean Purl    HPI: Patient is a 76 year old Caucasian female who presents today as a referral from their nephrologist, Dr. Anthonette Legato, for microscopic hematuria.    Patient was found to have microscopic hematuria on 03/2016 with 15-30 RBC's/hpf.  Patient doesn't have a prior history of microscopic hematuria.    She does not have a prior history of recurrent urinary tract infections, nephrolithiasis, trauma to the genitourinary tract or malignancies of the genitourinary tract.   She does not have a family medical history of nephrolithiasis, malignancies of the genitourinary tract or hematuria.   Today, she is having symptoms of urgency, nocturia, incontinence, hesitancy, and a weak urinary stream.  Her UA today demonstrates 0-5 RBC's.  She is not experiencing any suprapubic pain, abdominal pain or flank pain.  She denies any recent fevers, chills, nausea or vomiting.   She had a contrast study in 2016 which noted kidneys bilaterally show no appreciable hydronephrosis. There is a tiny cyst in the lateral mid left kidney. There is mild scarring in the anterior lower pole left kidney. There is calcification in a peripheral branch of the left renal artery. There is no renal or ureteral calculus on either side.  She is a smoker.  She has not worked with Sports administrator, trichloroethylene, etc.        PMH: Past Medical History:  Diagnosis Date  . A-fib ([place])   . Allergic rhinitis   . Anxiety   . Chronic bronchitis ([place])   . CKD (chronic kidney disease), stage III   . Cornea scar   . Diverticulosis   . Hyperlipidemia   . Hypertension   . Osteoporosis   . Risk for falls   . Situational disturbance   . TIA (transient  ischemic attack)     Surgical History: Past Surgical History:  Procedure Laterality Date  . COLONOSCOPY  2016  . ESOPHAGUS SURGERY      Home Medications:  Allergies as of 02/02/2017      Reactions   Cefuroxime Axetil    caused rash.   Ciprofloxacin    caused rash.   Codeine       Medication List       Accurate as of 02/02/17  9:30 AM. Always use your most recent med list.          albuterol 108 (90 Base) MCG/ACT inhaler Commonly known as:  PROVENTIL HFA;VENTOLIN HFA Inhale 2 puffs into the lungs every 6 (six) hours as needed for wheezing or shortness of breath.   ALPRAZolam 0.25 MG tablet Commonly known as:  XANAX Take 1 tablet (0.25 mg total) by mouth at bedtime as needed for anxiety.   atenolol 25 MG tablet Commonly known as:  TENORMIN Take 1 tablet (25 mg total) by mouth daily.   PARoxetine 30 MG tablet Commonly known as:  PAXIL Take 1 tablet (30 mg total) by mouth daily.   pravastatin 40 MG tablet Commonly known as:  PRAVACHOL Take 1 tablet (40 mg total) by mouth daily. Please put on file. Patient will call when finishing what she have.   warfarin 2 MG tablet Commonly known as:  COUMADIN Take 1 tablet (2 mg total) by mouth daily at 6 PM.  Discharge Care Instructions        Start     Ordered   02/02/17 0000  CT ABDOMEN PELVIS WO CONTRAST    Question Answer Comment  Preferred imaging location? [facility]   Radiology Contrast Protocol - do NOT remove file path [email or web address]      02/02/17 0930      Allergies:  Allergies  Allergen Reactions  . Cefuroxime Axetil     caused rash.  . Ciprofloxacin     caused rash.  . Codeine     Family History: Family History  Problem Relation Age of Onset  . Hypertension Mother   . Other Mother        Uremeic posioning  . Heart attack Father   . Prostate cancer Father   . Kidney cancer Neg Hx   . Bladder Cancer Neg Hx     Social History:  reports  that she has been smoking Cigarettes.  She has a 15.25 pack-year smoking history. She has never used smokeless tobacco. She reports that she does not drink alcohol or use drugs.  ROS: UROLOGY Frequent Urination?: No Hard to postpone urination?: Yes Burning/pain with urination?: No Get up at night to urinate?: Yes Leakage of urine?: Yes Urine stream starts and stops?: No Trouble starting stream?: Yes Do you have to strain to urinate?: No Blood in urine?: No Urinary tract infection?: No Sexually transmitted disease?: No Injury to kidneys or bladder?: No Painful intercourse?: No Weak stream?: Yes Currently pregnant?: No Vaginal bleeding?: No Last menstrual period?: n  Gastrointestinal Nausea?: No Vomiting?: No Indigestion/heartburn?: No Diarrhea?: Yes Constipation?: Yes  Constitutional Fever: No Night sweats?: Yes Weight loss?: No Fatigue?: Yes  Skin Skin rash/lesions?: No Itching?: No  Eyes Blurred vision?: No Double vision?: No  Ears/Nose/Throat Sore throat?: No Sinus problems?: Yes  Hematologic/Lymphatic Swollen glands?: No Easy bruising?: Yes  Cardiovascular Leg swelling?: No Chest pain?: No  Respiratory Cough?: Yes Shortness of breath?: Yes  Endocrine Excessive thirst?: No  Musculoskeletal Back pain?: Yes Joint pain?: No  Neurological Headaches?: No Dizziness?: No  Psychologic Depression?: Yes Anxiety?: Yes  Physical Exam: BP 125/69   Pulse 76   Ht 5' 7" (1.702 m)   Wt 139 lb 4 oz (63.2 kg)   BMI 21.81 kg/m   Constitutional: Well nourished. Alert and oriented, No acute distress. HEENT: [place] AT, moist mucus membranes. Trachea midline, no masses. Cardiovascular: No clubbing, cyanosis, or edema. Respiratory: Normal respiratory effort, no increased work of breathing. GI: Abdomen is soft, non tender, non distended, no abdominal masses. Liver and spleen not palpable.  No hernias appreciated.  Stool sample for occult testing is not  indicated.   GU: No CVA tenderness.  No bladder fullness or masses.  Atrophic external genitalia, normal pubic hair distribution, no lesions.  Normal urethral meatus, no lesions, no prolapse, no discharge.   No urethral masses, tenderness and/or tenderness. No bladder fullness, tenderness or masses. Normal vagina mucosa, good estrogen effect, no discharge, no lesions, good pelvic support, no cystocele or rectocele noted.  No cervical motion tenderness.  Uterus is freely mobile and non-fixed.  No adnexal/parametria masses or tenderness noted.  Anus and perineum are without rashes or lesions.    Skin: No rashes, bruises or suspicious lesions. Lymph: No cervical or inguinal adenopathy. Neurologic: Grossly intact, no focal deficits, moving all 4 extremities. Psychiatric: Normal mood and affect.  Laboratory Data: Lab Results  Component Value Date   CREATININE 1.65 (H) 02/02/2017  Component Value Date/Time   CHOL 161 11/10/2016 1531   CHOL 191 11/10/2015 1211   CHOL 144 07/03/2014 0526   HDL 40 (L) 11/10/2016 1531   HDL 46 11/10/2015 1211   HDL 33 (L) 07/03/2014 0526   CHOLHDL 4.0 11/10/2016 1531   VLDL 37 (H) 11/10/2016 1531   VLDL 64 (H) 07/03/2014 0526   LDLCALC 84 11/10/2016 1531   LDLCALC 104 (H) 11/10/2015 1211   LDLCALC 47 07/03/2014 0526    Lab Results  Component Value Date   AST 16 02/10/2016   Lab Results  Component Value Date   ALT 12 02/10/2016    Urinalysis 0-5 RBC's.  See EPIC.    Pertinent Imaging: CLINICAL DATA:  Two-month history of lower abdominal pain  EXAM: CT ABDOMEN AND PELVIS WITH CONTRAST  TECHNIQUE: Multidetector CT imaging of the abdomen and pelvis was performed using the standard protocol following bolus administration of intravenous contrast. Oral contrast was also administered.  CONTRAST:  22m OMNIPAQUE IOHEXOL 300 MG/ML  SOLN  COMPARISON:  Pelvic ultrasound January 22, 2015  FINDINGS: There is mild bibasilar lung  scarring.  No focal liver lesions are identified. Gallbladder wall is not appreciably thickened. There is no biliary duct dilatation.  Spleen, pancreas, and adrenals appear normal.  Kidneys bilaterally show no appreciable hydronephrosis. There is a tiny cyst in the lateral mid left kidney. There is mild scarring in the anterior lower pole left kidney. There is calcification in a peripheral branch of the left renal artery. There is no renal or ureteral calculus on either side.  There is mild dilatation of the mid the distal abdominal aorta with a maximum transverse diameter of 2.8 x 2.6 cm in the infrarenal region. Just above the bifurcation, the maximum transverse diameter of the aorta is 2.5 x 2.4 cm. Atherosclerotic calcification is noted in both common and external iliac arteries. Calcification is also noted in the left hypogastric artery and both common femoral arteries.  In the pelvis, the urinary bladder is midline with normal wall thickness. The uterus is anteverted. There is no pelvic mass or pelvic collection. The appendix appears normal. There are sigmoid diverticula without diverticulitis.  There is no bowel obstruction. No free air or portal venous air. There is no bowel wall or mesenteric thickening.  There is no appreciable ascites, adenopathy, or abscess in the abdomen or pelvis. There is degenerative change in both hip joints as well as in the lumbar spine. There is vacuum phenomenon at L5-S1. No blastic or lytic bone lesions are identified.  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 No periaortic fluid.  No bowel obstruction. No bowel wall thickening. No abscess. Appendix appears normal.  No renal or ureteral calculus.  No hydronephrosis.   Electronically Signed   By: WLowella GripIII M.D.   On: 02/15/2015 13:24  I have independently reviewed the films.    Assessment & Plan:    1. Microscopic hematuria  -  I explained to the patient that there are a number of causes that can be associated with blood in the urine, such as stones, UTI's, damage to the urinary tract and/or cancer.  - At this time, I felt that the patient warranted further urologic evaluation.   The AUA guidelines state that a CT urogram is the preferred imaging study to evaluate hematuria - but due to her CKD it is not safe to expose her kidney to contrast  - Because of this, I offered her a cystoscopy  with bilateral retrogrades in the OR to complete the hematuria workup in addition to the imaging studies - she would like to defer this and wait on the results of the non contrast CT and in the in office cystoscopy  - The patient had the opportunity to ask questions which were answered. Based upon this discussion, the patient is willing to proceed. Therefore, I've ordered: a CT and cystoscopy.  - The patient will return following all of the above for discussion of the results.   - Her reproductive status is postmenopausal  - UA  - Urine culture  - BUN + creatinine    2. CKD Stage III  - Cr 1.65   eGFR 29  3. Tobacco use  - advised the patient that smoking places her at an increased risk of GU malignancies    Return for CT report and cystoscopy.  These notes generated with voice recognition software. I apologize for typographical errors.  Zara Council, [facility] [address], [place], [place] [address] [phone]

## 2017-02-02 ENCOUNTER — Ambulatory Visit (INDEPENDENT_AMBULATORY_CARE_PROVIDER_SITE_OTHER): Payer: Medicare Other | Admitting: Urology

## 2017-02-02 ENCOUNTER — Encounter: Payer: Self-pay | Admitting: Urology

## 2017-02-02 ENCOUNTER — Other Ambulatory Visit
Admission: RE | Admit: 2017-02-02 | Discharge: 2017-02-02 | Disposition: A | Discharge status: Home / Self Care | Payer: Medicare Other | Source: Ambulatory Visit | Attending: Urology | Admitting: Urology

## 2017-02-02 VITALS — BP 125/69 | HR 76 | Ht 67.0 in | Wt 139.2 lb

## 2017-02-02 DIAGNOSIS — N183 Chronic kidney disease, stage 3 unspecified: Secondary | ICD-10-CM

## 2017-02-02 DIAGNOSIS — R3129 Other microscopic hematuria: Secondary | ICD-10-CM | POA: Diagnosis not present

## 2017-02-02 DIAGNOSIS — Z720 Tobacco use: Secondary | ICD-10-CM | POA: Diagnosis not present

## 2017-02-02 LAB — URINALYSIS, COMPLETE (UACMP) WITH MICROSCOPIC
Bacteria, UA: NONE SEEN
Bilirubin Urine: NEGATIVE
GLUCOSE, UA: NEGATIVE mg/dL
KETONES UR: NEGATIVE mg/dL
LEUKOCYTES UA: NEGATIVE
NITRITE: NEGATIVE
PROTEIN: NEGATIVE mg/dL
Specific Gravity, Urine: 1.02 (ref 1.005–1.030)
pH: 5 (ref 5.0–8.0)

## 2017-02-02 LAB — CREATININE, SERUM
Creatinine, Ser: 1.65 mg/dL — ABNORMAL HIGH (ref 0.44–1.00)
GFR calc Af Amer: 34 mL/min — ABNORMAL LOW (ref >60)
GFR calc non Af Amer: 29 mL/min — ABNORMAL LOW (ref >60)

## 2017-02-02 LAB — BUN: BUN: 23 mg/dL — ABNORMAL HIGH (ref 6–20)

## 2017-02-03 LAB — URINE CULTURE

## 2017-02-16 ENCOUNTER — Ambulatory Visit: Payer: Medicare Other

## 2017-02-20 ENCOUNTER — Ambulatory Visit
Admission: RE | Admit: 2017-02-20 | Discharge: 2017-02-20 | Disposition: A | Discharge status: Home / Self Care | Payer: Medicare Other | Source: Ambulatory Visit | Attending: Urology | Admitting: Urology

## 2017-02-20 ENCOUNTER — Ambulatory Visit: Payer: Medicare Other

## 2017-02-20 DIAGNOSIS — R319 Hematuria, unspecified: Secondary | ICD-10-CM | POA: Diagnosis not present

## 2017-02-20 DIAGNOSIS — R3129 Other microscopic hematuria: Secondary | ICD-10-CM | POA: Insufficient documentation

## 2017-02-22 ENCOUNTER — Other Ambulatory Visit: Payer: Self-pay

## 2017-02-22 DIAGNOSIS — R3129 Other microscopic hematuria: Secondary | ICD-10-CM

## 2017-02-23 ENCOUNTER — Other Ambulatory Visit
Admission: RE | Admit: 2017-02-23 | Discharge: 2017-02-23 | Disposition: A | Discharge status: Home / Self Care | Payer: Medicare Other | Source: Ambulatory Visit | Attending: Urology | Admitting: Urology

## 2017-02-23 ENCOUNTER — Encounter: Payer: Self-pay | Admitting: Urology

## 2017-02-23 ENCOUNTER — Ambulatory Visit (INDEPENDENT_AMBULATORY_CARE_PROVIDER_SITE_OTHER): Payer: Medicare Other | Admitting: Urology

## 2017-02-23 VITALS — BP 119/61 | HR 68 | Ht 66.0 in | Wt 140.0 lb

## 2017-02-23 DIAGNOSIS — R3129 Other microscopic hematuria: Secondary | ICD-10-CM | POA: Insufficient documentation

## 2017-02-23 LAB — URINALYSIS, COMPLETE (UACMP) WITH MICROSCOPIC
Bacteria, UA: NONE SEEN
Bilirubin Urine: NEGATIVE
Glucose, UA: NEGATIVE mg/dL
Ketones, ur: NEGATIVE mg/dL
Leukocytes, UA: NEGATIVE
NITRITE: NEGATIVE
Protein, ur: NEGATIVE mg/dL
SPECIFIC GRAVITY, URINE: 1.015 (ref 1.005–1.030)
WBC, UA: NONE SEEN WBC/hpf (ref 0–5)
pH: 5.5 (ref 5.0–8.0)

## 2017-02-23 MED ORDER — LIDOCAINE HCL 2 % EX GEL
1.0000 "application " | Freq: Once | CUTANEOUS | Status: AC
  Administered 2017-02-23: 1 via URETHRAL

## 2017-02-23 MED ORDER — CIPROFLOXACIN HCL 500 MG PO TABS
500.0000 mg | ORAL_TABLET | Freq: Once | ORAL | Status: AC
  Administered 2017-02-23: 500 mg via ORAL

## 2017-02-23 NOTE — Progress Notes (Signed)
   02/23/17  CC:  Chief Complaint  Patient presents with  . Cysto    HPI: 76 year old female with microscopic hematuria who presents today for cystoscopy to complete her workup.  She underwent CT abd/ pelvis w/o contrast on 02/20/2017 (Cr elevated to 1.65) which showed no obvious GU pathology.  There was a 2 mm vascular calcification in the right upper kidney which does not appear to be consistent with stone.  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  Contrasted study 2016 also unremarkable. She was offered cystoscopy bilateral retrogrades but declined.  Blood pressure 119/61, pulse 68, height 5\' 6"  (1.676 m), weight 140 lb (63.5 kg). NED. A&Ox3.   No respiratory distress   Abd soft, NT, ND Normal external genitalia with patent urethral meatus  Cystoscopy Procedure Note  Patient identification was confirmed, informed consent was obtained, and patient was prepped using Betadine solution.  Lidocaine jelly was administered per urethral meatus.    Preoperative abx where received prior to procedure.    Procedure: - Flexible cystoscope introduced, without any difficulty.   - Thorough search of the bladder revealed:    normal urethral meatus    normal urothelium    no stones    no ulcers     no tumors    no urethral polyps    no trabeculation  - Ureteral orifices were normal in position and appearance.  Post-Procedure: - Patient tolerated the procedure well  Assessment/ Plan:   1. Microscopic hematuria S/p cystoscopy today without evidence of disease Noncontrast CT scan unremarkable Previous contrast study in 2016 also unremarkable No delayed ureteral imaging, however, declined retrograde pyelogram given her nonsmoking history, very low risk for upper tract urothelial carcinoma Follow-up as needed - lidocaine (XYLOCAINE) 2 % jelly 1 application; Place 1 application into the urethra once. - ciprofloxacin (CIPRO) tablet 500 mg; Take 1 tablet (500  mg total) by mouth once.   Hollice Espy, MD

## 2017-03-13 ENCOUNTER — Ambulatory Visit (INDEPENDENT_AMBULATORY_CARE_PROVIDER_SITE_OTHER): Payer: Medicare Other | Admitting: Family Medicine

## 2017-03-13 ENCOUNTER — Encounter: Payer: Self-pay | Admitting: Family Medicine

## 2017-03-13 VITALS — BP 106/64 | HR 92 | Temp 97.7°F | Resp 16 | Ht 66.0 in | Wt 141.4 lb

## 2017-03-13 DIAGNOSIS — R42 Dizziness and giddiness: Secondary | ICD-10-CM | POA: Diagnosis not present

## 2017-03-13 DIAGNOSIS — Z5181 Encounter for therapeutic drug level monitoring: Secondary | ICD-10-CM | POA: Diagnosis not present

## 2017-03-13 DIAGNOSIS — Z23 Encounter for immunization: Secondary | ICD-10-CM

## 2017-03-13 DIAGNOSIS — Z7901 Long term (current) use of anticoagulants: Secondary | ICD-10-CM

## 2017-03-13 DIAGNOSIS — R739 Hyperglycemia, unspecified: Secondary | ICD-10-CM

## 2017-03-13 LAB — COMPLETE METABOLIC PANEL WITH GFR
AG Ratio: 1.4 (calc) (ref 1.0–2.5)
ALBUMIN MSPROF: 4.3 g/dL (ref 3.6–5.1)
ALT: 10 U/L (ref 6–29)
AST: 16 U/L (ref 10–35)
Alkaline phosphatase (APISO): 76 U/L (ref 33–130)
BUN / CREAT RATIO: 14 (calc) (ref 6–22)
BUN: 20 mg/dL (ref 7–25)
CALCIUM: 10 mg/dL (ref 8.6–10.4)
CO2: 27 mmol/L (ref 20–32)
CREATININE: 1.48 mg/dL — AB (ref 0.60–0.93)
Chloride: 101 mmol/L (ref 98–110)
GFR, EST NON AFRICAN AMERICAN: 34 mL/min/{1.73_m2} — AB (ref >=60)
GFR, Est African American: 39 mL/min/{1.73_m2} — ABNORMAL LOW (ref >=60)
GLOBULIN: 3 g/dL (ref 1.9–3.7)
Glucose, Bld: 95 mg/dL (ref 65–99)
Potassium: 4.9 mmol/L (ref 3.5–5.3)
SODIUM: 135 mmol/L (ref 135–146)
Total Bilirubin: 0.5 mg/dL (ref 0.2–1.2)
Total Protein: 7.3 g/dL (ref 6.1–8.1)

## 2017-03-13 LAB — POCT INR: INR: 2.4

## 2017-03-13 LAB — CBC WITH DIFFERENTIAL/PLATELET
BASOS ABS: 52 {cells}/uL (ref 0–200)
Basophils Relative: 0.6 %
EOS PCT: 3 %
Eosinophils Absolute: 261 cells/uL (ref 15–500)
HEMATOCRIT: 41.2 % (ref 35.0–45.0)
HEMOGLOBIN: 14.1 g/dL (ref 11.7–15.5)
LYMPHS ABS: 2897 {cells}/uL (ref 850–3900)
MCH: 33.6 pg — ABNORMAL HIGH (ref 27.0–33.0)
MCHC: 34.2 g/dL (ref 32.0–36.0)
MCV: 98.1 fL (ref 80.0–100.0)
MPV: 9.8 fL (ref 7.5–12.5)
Monocytes Relative: 6.6 %
NEUTROS ABS: 4916 {cells}/uL (ref 1500–7800)
Neutrophils Relative %: 56.5 %
Platelets: 240 10*3/uL (ref 140–400)
RBC: 4.2 10*6/uL (ref 3.80–5.10)
RDW: 12.6 % (ref 11.0–15.0)
Total Lymphocyte: 33.3 %
WBC mixed population: 574 cells/uL (ref 200–950)
WBC: 8.7 10*3/uL (ref 3.8–10.8)

## 2017-03-13 LAB — POCT GLYCOSYLATED HEMOGLOBIN (HGB A1C): HEMOGLOBIN A1C: 5.7

## 2017-03-13 NOTE — Progress Notes (Signed)
Name: Christine Burnett   MRN: [id number]    DOB: 1941-02-24   Date:03/13/2017       Progress Note  Subjective  Chief Complaint  Chief Complaint  Patient presents with  . Hyperglycemia    Everytime she eats or drinks anything with sugar, she gets blurred vision, and feels dizzy. Almost like she crashes would like to be checked for DM.     Hyperglycemia    Patient reports experiencing jitteriness and weakness after eating cake and root beer at a birthday party over the weekend, she felt dizzy yesterday after eating candy quadrant and had blurry vision, she is concerned about these symptoms and wondering if she has diabetes. She reports not eating regularly, and skips breakfast and then has some lunch and then early dinner, today she feels well.  Past Medical History:  Diagnosis Date  . A-fib ([place])   . Allergic rhinitis   . Anxiety   . Chronic bronchitis ([place])   . CKD (chronic kidney disease), stage III ([place])   . Cornea scar   . Diverticulosis   . Hyperlipidemia   . Hypertension   . Osteoporosis   . Risk for falls   . Situational disturbance   . TIA (transient ischemic attack)     Past Surgical History:  Procedure Laterality Date  . COLONOSCOPY  2016  . ESOPHAGUS SURGERY      Family History  Problem Relation Age of Onset  . Hypertension Mother   . Other Mother        Uremeic posioning  . Heart attack Father   . Prostate cancer Father   . Kidney cancer Neg Hx   . Bladder Cancer Neg Hx     Social History   Social History  . Marital status: Divorced    Spouse name: N/A  . Number of children: N/A  . Years of education: N/A   Occupational History  . Not on file.   Social History Main Topics  . Smoking status: Current Every Day Smoker    Packs/day: 0.25    Years: 61.00    Types: Cigarettes  . Smokeless tobacco: Never Used  . Alcohol use No  . Drug use: No  . Sexual activity: No   Other Topics Concern  . Not on file   Social History Narrative  . No  narrative on file     Current Outpatient Prescriptions:  .  albuterol (PROVENTIL HFA;VENTOLIN HFA) 108 (90 Base) MCG/ACT inhaler, Inhale 2 puffs into the lungs every 6 (six) hours as needed for wheezing or shortness of breath., Disp: 1 Inhaler, Rfl: 2 .  ALPRAZolam (XANAX) 0.25 MG tablet, Take 1 tablet (0.25 mg total) by mouth at bedtime as needed for anxiety., Disp: 30 tablet, Rfl: 2 .  atenolol (TENORMIN) 25 MG tablet, Take 1 tablet (25 mg total) by mouth daily., Disp: 90 tablet, Rfl: 0 .  PARoxetine (PAXIL) 30 MG tablet, Take 1 tablet (30 mg total) by mouth daily., Disp: 90 tablet, Rfl: 0 .  pravastatin (PRAVACHOL) 40 MG tablet, Take 1 tablet (40 mg total) by mouth daily. Please put on file. Patient will call when finishing what she have., Disp: 90 tablet, Rfl: 0 .  warfarin (COUMADIN) 2 MG tablet, Take 1 tablet (2 mg total) by mouth daily at 6 PM., Disp: 90 tablet, Rfl: 3  Allergies  Allergen Reactions  . Cefuroxime Axetil     caused rash.  . Ciprofloxacin     Unknown  . Codeine  ROS  See history of present illness for complete discussion of ROS  Objective  Vitals:   03/13/17 1508  BP: 106/64  Pulse: 92  Resp: 16  Temp: 97.7 F (36.5 C)  TempSrc: Oral  SpO2: 96%  Weight: 141 lb 6.4 oz (64.1 kg)  Height: _0  (1.676 m)    Physical Exam  Constitutional: She is oriented to person, place, and time and well-developed, well-nourished, and in no distress.  HENT:  Head: Normocephalic and atraumatic.  Cardiovascular: Normal rate, regular rhythm and normal heart sounds.   No murmur heard. Pulmonary/Chest: Effort normal and breath sounds normal. She has no wheezes.  Abdominal: Soft. Bowel sounds are normal. There is no tenderness.  Musculoskeletal: She exhibits no edema.  Neurological: She is alert and oriented to person, place, and time.  Psychiatric: Mood, memory, affect and judgment normal.  Nursing note and vitals reviewed.      Recent Results (from the  past 2160 hour(s))  POCT INR     Status: Normal   Collection Time: 01/16/17 11:43 AM  Result Value Ref Range   INR 3.0   BUN     Status: Abnormal   Collection Time: 02/02/17  8:31 AM  Result Value Ref Range   BUN 23 (H) 6 - 20 mg/dL  Creatinine, serum     Status: Abnormal   Collection Time: 02/02/17  8:31 AM  Result Value Ref Range   Creatinine, Ser 1.65 (H) 0.44 - 1.00 mg/dL   GFR calc non Af Amer 29 (L) >60 mL/min   GFR calc Af Amer 34 (L) >60 mL/min    Comment: (NOTE) The eGFR has been calculated using the CKD EPI equation. This calculation has not been validated in all clinical situations. eGFR's persistently <60 mL/min signify possible Chronic Kidney Disease.   Urinalysis, Complete w Microscopic     Status: Abnormal   Collection Time: 02/02/17  8:32 AM  Result Value Ref Range   Color, Urine YELLOW YELLOW   APPearance CLEAR CLEAR   Specific Gravity, Urine 1.020 1.005 - 1.030   pH 5.0 5.0 - 8.0   Glucose, UA NEGATIVE NEGATIVE mg/dL   Hgb urine dipstick SMALL (A) NEGATIVE   Bilirubin Urine NEGATIVE NEGATIVE   Ketones, ur NEGATIVE NEGATIVE mg/dL   Protein, ur NEGATIVE NEGATIVE mg/dL   Nitrite NEGATIVE NEGATIVE   Leukocytes, UA NEGATIVE NEGATIVE   Squamous Epithelial / LPF 0-5 (A) NONE SEEN   WBC, UA 0-5 0 - 5 WBC/hpf   RBC / HPF 0-5 0 - 5 RBC/hpf   Bacteria, UA NONE SEEN NONE SEEN   Mucus PRESENT    Hyaline Casts, UA PRESENT   Urine Culture     Status: Abnormal   Collection Time: 02/02/17  8:32 AM  Result Value Ref Range   Specimen Description URINE, CLEAN CATCH    Special Requests NONE    Culture (A)     <10,000 COLONIES/mL INSIGNIFICANT GROWTH Performed at [facility], [address] [place], [place] [address]    Report Status 02/03/2017 FINAL   Urinalysis, Complete w Microscopic     Status: Abnormal   Collection Time: 02/23/17  2:26 PM  Result Value Ref Range   Color, Urine YELLOW YELLOW   APPearance CLEAR CLEAR   Specific Gravity, Urine 1.015 1.005  - 1.030   pH 5.5 5.0 - 8.0   Glucose, UA NEGATIVE NEGATIVE mg/dL   Hgb urine dipstick SMALL (A) NEGATIVE   Bilirubin Urine NEGATIVE NEGATIVE  Ketones, ur NEGATIVE NEGATIVE mg/dL   Protein, ur NEGATIVE NEGATIVE mg/dL   Nitrite NEGATIVE NEGATIVE   Leukocytes, UA NEGATIVE NEGATIVE   Squamous Epithelial / LPF 0-5 (A) NONE SEEN   WBC, UA NONE SEEN 0 - 5 WBC/hpf   RBC / HPF 6-30 0 - 5 RBC/hpf   Bacteria, UA NONE SEEN NONE SEEN   Hyaline Casts, UA PRESENT    Ca Oxalate Crys, UA PRESENT   POCT INR     Status: Normal   Collection Time: 03/13/17  3:09 PM  Result Value Ref Range   INR 2.4   POCT HgB A1C     Status: None   Collection Time: 03/13/17  3:12 PM  Result Value Ref Range   Hemoglobin A1C 5.7      Assessment & Plan  1. Anticoagulation goal of INR 2 to 3 Point-of-care INR is 2.4, consider therapeutic and at goal, continue on present dosage of Coumadin and recheck in 1 month - POCT INR  2. Need for immunization against influenza  - Flu vaccine HIGH DOSE PF (Fluzone High dose)  3. Hyperglycemia Point-of-care A1c is 5.7%, reassured patient that she does not have diabetes. I suspect that her symptoms mentioned in the history of present illness were because of excess sugar load after a period of fasting, this is now resolved, no further testing needed except for CBC and CMP - POCT HgB A1C - CBC with Differential/Platelet - COMPLETE METABOLIC PANEL WITH GFR  4. Dizziness of unknown cause Suspect because of symptomatic hypoglycemia, now resolved. reassured   Lavonna Lampron [facility] 03/13/2017 3:29 PM

## 2017-03-16 ENCOUNTER — Telehealth: Payer: Self-pay

## 2017-03-16 NOTE — Telephone Encounter (Signed)
-----   Message from Roselee Nova, MD sent at 03/14/2017  2:23 PM EDT ----- CBC shows normal white count, hemoglobin, hematocrit and platelets CMP shows elevated creatinine at 1.48 with a GFR of 34, this is relatively improved from last month. Otherwise normal electrolytes and liver enzymes Hemoglobin A1c is 5.7%, considered normal INR is 2.4, considered therapeutic, she should continue on present dose of Coumadin and recheck in 1 month

## 2017-03-16 NOTE — Telephone Encounter (Signed)
Called pt to inform her of lab results. Line busy (likely due to recent power outage) will call again.

## 2017-03-19 NOTE — Telephone Encounter (Signed)
Called pt no answer. LM for pt informing her of the information below. To call back for questions or concerns.

## 2017-03-25 ENCOUNTER — Emergency Department: Payer: Medicare Other

## 2017-03-25 ENCOUNTER — Inpatient Hospital Stay
Admission: EM | Admit: 2017-03-25 | Discharge: 2017-03-29 | DRG: 481 | Disposition: A | Discharge status: Skilled Nursing Facility | Payer: Medicare Other | Attending: Internal Medicine | Admitting: Internal Medicine

## 2017-03-25 ENCOUNTER — Encounter: Payer: Self-pay | Admitting: Emergency Medicine

## 2017-03-25 DIAGNOSIS — Z79899 Other long term (current) drug therapy: Secondary | ICD-10-CM

## 2017-03-25 DIAGNOSIS — M810 Age-related osteoporosis without current pathological fracture: Secondary | ICD-10-CM | POA: Diagnosis present

## 2017-03-25 DIAGNOSIS — S72009A Fracture of unspecified part of neck of unspecified femur, initial encounter for closed fracture: Secondary | ICD-10-CM | POA: Diagnosis not present

## 2017-03-25 DIAGNOSIS — T45515A Adverse effect of anticoagulants, initial encounter: Secondary | ICD-10-CM | POA: Diagnosis present

## 2017-03-25 DIAGNOSIS — W010XXA Fall on same level from slipping, tripping and stumbling without subsequent striking against object, initial encounter: Secondary | ICD-10-CM | POA: Diagnosis present

## 2017-03-25 DIAGNOSIS — E781 Pure hyperglyceridemia: Secondary | ICD-10-CM | POA: Diagnosis present

## 2017-03-25 DIAGNOSIS — M25552 Pain in left hip: Secondary | ICD-10-CM | POA: Diagnosis not present

## 2017-03-25 DIAGNOSIS — J309 Allergic rhinitis, unspecified: Secondary | ICD-10-CM | POA: Diagnosis present

## 2017-03-25 DIAGNOSIS — N189 Chronic kidney disease, unspecified: Secondary | ICD-10-CM | POA: Diagnosis not present

## 2017-03-25 DIAGNOSIS — I4891 Unspecified atrial fibrillation: Secondary | ICD-10-CM | POA: Diagnosis present

## 2017-03-25 DIAGNOSIS — N183 Chronic kidney disease, stage 3 (moderate): Secondary | ICD-10-CM | POA: Diagnosis present

## 2017-03-25 DIAGNOSIS — I6529 Occlusion and stenosis of unspecified carotid artery: Secondary | ICD-10-CM | POA: Diagnosis present

## 2017-03-25 DIAGNOSIS — Z8673 Personal history of transient ischemic attack (TIA), and cerebral infarction without residual deficits: Secondary | ICD-10-CM

## 2017-03-25 DIAGNOSIS — Z881 Allergy status to other antibiotic agents status: Secondary | ICD-10-CM | POA: Diagnosis not present

## 2017-03-25 DIAGNOSIS — F419 Anxiety disorder, unspecified: Secondary | ICD-10-CM | POA: Diagnosis present

## 2017-03-25 DIAGNOSIS — F17210 Nicotine dependence, cigarettes, uncomplicated: Secondary | ICD-10-CM | POA: Diagnosis present

## 2017-03-25 DIAGNOSIS — R262 Difficulty in walking, not elsewhere classified: Secondary | ICD-10-CM | POA: Diagnosis not present

## 2017-03-25 DIAGNOSIS — Z9181 History of falling: Secondary | ICD-10-CM | POA: Diagnosis not present

## 2017-03-25 DIAGNOSIS — I130 Hypertensive heart and chronic kidney disease with heart failure and stage 1 through stage 4 chronic kidney disease, or unspecified chronic kidney disease: Secondary | ICD-10-CM | POA: Diagnosis present

## 2017-03-25 DIAGNOSIS — S199XXA Unspecified injury of neck, initial encounter: Secondary | ICD-10-CM | POA: Diagnosis not present

## 2017-03-25 DIAGNOSIS — I129 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 stage 4 chronic kidney disease, or unspecified chronic kidney disease: Secondary | ICD-10-CM | POA: Diagnosis not present

## 2017-03-25 DIAGNOSIS — Z7401 Bed confinement status: Secondary | ICD-10-CM | POA: Diagnosis not present

## 2017-03-25 DIAGNOSIS — Z7901 Long term (current) use of anticoagulants: Secondary | ICD-10-CM | POA: Diagnosis not present

## 2017-03-25 DIAGNOSIS — J849 Interstitial pulmonary disease, unspecified: Secondary | ICD-10-CM | POA: Diagnosis not present

## 2017-03-25 DIAGNOSIS — S72002A Fracture of unspecified part of neck of left femur, initial encounter for closed fracture: Secondary | ICD-10-CM

## 2017-03-25 DIAGNOSIS — S0990XA Unspecified injury of head, initial encounter: Secondary | ICD-10-CM | POA: Diagnosis not present

## 2017-03-25 DIAGNOSIS — Z8249 Family history of ischemic heart disease and other diseases of the circulatory system: Secondary | ICD-10-CM | POA: Diagnosis not present

## 2017-03-25 DIAGNOSIS — Z419 Encounter for procedure for purposes other than remedying health state, unspecified: Secondary | ICD-10-CM

## 2017-03-25 DIAGNOSIS — W19XXXA Unspecified fall, initial encounter: Secondary | ICD-10-CM

## 2017-03-25 DIAGNOSIS — S7292XA Unspecified fracture of left femur, initial encounter for closed fracture: Secondary | ICD-10-CM | POA: Diagnosis not present

## 2017-03-25 DIAGNOSIS — D689 Coagulation defect, unspecified: Secondary | ICD-10-CM | POA: Diagnosis present

## 2017-03-25 DIAGNOSIS — R42 Dizziness and giddiness: Secondary | ICD-10-CM | POA: Diagnosis not present

## 2017-03-25 DIAGNOSIS — I480 Paroxysmal atrial fibrillation: Secondary | ICD-10-CM | POA: Diagnosis not present

## 2017-03-25 DIAGNOSIS — I5030 Unspecified diastolic (congestive) heart failure: Secondary | ICD-10-CM | POA: Diagnosis not present

## 2017-03-25 DIAGNOSIS — S72142A Displaced intertrochanteric fracture of left femur, initial encounter for closed fracture: Principal | ICD-10-CM | POA: Diagnosis present

## 2017-03-25 DIAGNOSIS — Z5181 Encounter for therapeutic drug level monitoring: Secondary | ICD-10-CM | POA: Diagnosis not present

## 2017-03-25 DIAGNOSIS — Z885 Allergy status to narcotic agent status: Secondary | ICD-10-CM

## 2017-03-25 DIAGNOSIS — W1839XA Other fall on same level, initial encounter: Secondary | ICD-10-CM | POA: Diagnosis not present

## 2017-03-25 DIAGNOSIS — Y93K1 Activity, walking an animal: Secondary | ICD-10-CM

## 2017-03-25 DIAGNOSIS — M6281 Muscle weakness (generalized): Secondary | ICD-10-CM | POA: Diagnosis not present

## 2017-03-25 DIAGNOSIS — R55 Syncope and collapse: Secondary | ICD-10-CM | POA: Diagnosis not present

## 2017-03-25 DIAGNOSIS — I10 Essential (primary) hypertension: Secondary | ICD-10-CM | POA: Diagnosis not present

## 2017-03-25 DIAGNOSIS — S79919A Unspecified injury of unspecified hip, initial encounter: Secondary | ICD-10-CM | POA: Diagnosis not present

## 2017-03-25 DIAGNOSIS — E785 Hyperlipidemia, unspecified: Secondary | ICD-10-CM | POA: Diagnosis present

## 2017-03-25 DIAGNOSIS — M80052D Age-related osteoporosis with current pathological fracture, left femur, subsequent encounter for fracture with routine healing: Secondary | ICD-10-CM | POA: Diagnosis not present

## 2017-03-25 DIAGNOSIS — I272 Pulmonary hypertension, unspecified: Secondary | ICD-10-CM | POA: Diagnosis present

## 2017-03-25 DIAGNOSIS — Z87891 Personal history of nicotine dependence: Secondary | ICD-10-CM | POA: Diagnosis not present

## 2017-03-25 DIAGNOSIS — S72012A Unspecified intracapsular fracture of left femur, initial encounter for closed fracture: Secondary | ICD-10-CM | POA: Diagnosis not present

## 2017-03-25 LAB — CBC
HEMATOCRIT: 40.1 % (ref 35.0–47.0)
HEMOGLOBIN: 13.9 g/dL (ref 12.0–16.0)
MCH: 34.6 pg — AB (ref 26.0–34.0)
MCHC: 34.6 g/dL (ref 32.0–36.0)
MCV: 99.8 fL (ref 80.0–100.0)
Platelets: 211 10*3/uL (ref 150–440)
RBC: 4.02 MIL/uL (ref 3.80–5.20)
RDW: 13.5 % (ref 11.5–14.5)
WBC: 11.4 10*3/uL — ABNORMAL HIGH (ref 3.6–11.0)

## 2017-03-25 LAB — BASIC METABOLIC PANEL
Anion gap: 8 (ref 5–15)
BUN: 18 mg/dL (ref 6–20)
CALCIUM: 9.1 mg/dL (ref 8.9–10.3)
CO2: 25 mmol/L (ref 22–32)
CREATININE: 1.61 mg/dL — AB (ref 0.44–1.00)
Chloride: 104 mmol/L (ref 101–111)
GFR calc non Af Amer: 30 mL/min — ABNORMAL LOW (ref >60)
GFR, EST AFRICAN AMERICAN: 34 mL/min — AB (ref >60)
Glucose, Bld: 107 mg/dL — ABNORMAL HIGH (ref 65–99)
Potassium: 4.9 mmol/L (ref 3.5–5.1)
Sodium: 137 mmol/L (ref 135–145)

## 2017-03-25 LAB — PROTIME-INR
INR: 1.93
Prothrombin Time: 21.9 seconds — ABNORMAL HIGH (ref 11.4–15.2)

## 2017-03-25 LAB — TYPE AND SCREEN
ABO/RH(D): O POS
Antibody Screen: NEGATIVE

## 2017-03-25 LAB — APTT: aPTT: 34 seconds (ref 24–36)

## 2017-03-25 LAB — MRSA PCR SCREENING: MRSA BY PCR: NEGATIVE

## 2017-03-25 MED ORDER — ONDANSETRON HCL 4 MG/2ML IJ SOLN
4.0000 mg | Freq: Once | INTRAMUSCULAR | Status: AC
  Administered 2017-03-25: 4 mg via INTRAVENOUS
  Filled 2017-03-25: qty 2

## 2017-03-25 MED ORDER — MORPHINE SULFATE (PF) 2 MG/ML IV SOLN
2.0000 mg | INTRAVENOUS | Status: DC | PRN
  Administered 2017-03-26 (×2): 2 mg via INTRAVENOUS
  Filled 2017-03-25 (×2): qty 1

## 2017-03-25 MED ORDER — ATENOLOL 25 MG PO TABS
25.0000 mg | ORAL_TABLET | Freq: Every day | ORAL | Status: DC
  Administered 2017-03-26 – 2017-03-29 (×3): 25 mg via ORAL
  Filled 2017-03-25 (×3): qty 1

## 2017-03-25 MED ORDER — ALPRAZOLAM 0.25 MG PO TABS
0.2500 mg | ORAL_TABLET | Freq: Every evening | ORAL | Status: DC | PRN
  Administered 2017-03-26 – 2017-03-28 (×3): 0.25 mg via ORAL
  Filled 2017-03-25 (×3): qty 1

## 2017-03-25 MED ORDER — MORPHINE SULFATE (PF) 2 MG/ML IV SOLN
2.0000 mg | INTRAVENOUS | Status: DC | PRN
  Administered 2017-03-25: 2 mg via INTRAVENOUS
  Filled 2017-03-25: qty 1

## 2017-03-25 MED ORDER — PAROXETINE HCL 20 MG PO TABS
30.0000 mg | ORAL_TABLET | Freq: Every day | ORAL | Status: DC
  Administered 2017-03-27 – 2017-03-29 (×3): 30 mg via ORAL
  Filled 2017-03-25 (×3): qty 2

## 2017-03-25 MED ORDER — DOCUSATE SODIUM 100 MG PO CAPS
100.0000 mg | ORAL_CAPSULE | Freq: Two times a day (BID) | ORAL | Status: DC
  Administered 2017-03-25 – 2017-03-29 (×6): 100 mg via ORAL
  Filled 2017-03-25 (×7): qty 1

## 2017-03-25 MED ORDER — ACETAMINOPHEN 325 MG PO TABS
650.0000 mg | ORAL_TABLET | Freq: Four times a day (QID) | ORAL | Status: DC | PRN
  Administered 2017-03-27: 650 mg via ORAL
  Filled 2017-03-25: qty 2

## 2017-03-25 MED ORDER — DOCUSATE SODIUM 100 MG PO CAPS: 100.0000 mg | ORAL_CAPSULE | Freq: Two times a day (BID) | ORAL | Status: DC | PRN

## 2017-03-25 MED ORDER — SODIUM CHLORIDE 0.9 % IV BOLUS (SEPSIS)
1000.0000 mL | Freq: Once | INTRAVENOUS | Status: AC
  Administered 2017-03-25: 1000 mL via INTRAVENOUS

## 2017-03-25 MED ORDER — TRAMADOL HCL 50 MG PO TABS
50.0000 mg | ORAL_TABLET | Freq: Four times a day (QID) | ORAL | Status: DC | PRN
  Administered 2017-03-25 – 2017-03-27 (×4): 50 mg via ORAL
  Filled 2017-03-25 (×5): qty 1

## 2017-03-25 MED ORDER — PHYTONADIONE 5 MG PO TABS
2.5000 mg | ORAL_TABLET | Freq: Once | ORAL | Status: AC
  Administered 2017-03-25: 2.5 mg via ORAL
  Filled 2017-03-25: qty 1

## 2017-03-25 MED ORDER — MORPHINE SULFATE (PF) 2 MG/ML IV SOLN: 2.0000 mg | INTRAVENOUS | Status: DC | PRN

## 2017-03-25 MED ORDER — PRAVASTATIN SODIUM 20 MG PO TABS
40.0000 mg | ORAL_TABLET | Freq: Every evening | ORAL | Status: DC
  Administered 2017-03-27 – 2017-03-28 (×2): 40 mg via ORAL
  Filled 2017-03-25 (×2): qty 2

## 2017-03-25 NOTE — ED Provider Notes (Signed)
[facility] Emergency Department Provider Note  ____________________________________________  Time seen: Approximately 5:20 PM  I have reviewed the triage vital signs and the nursing notes.   HISTORY  Chief Complaint Fall   HPI Christine Burnett is a 76 y.o. female with a history of A. fib on Coumadin, chronic kidney disease, hypertension, hyperlipidemia who presents for evaluation of left hip pain status post mechanical fall. Patient was taking her dog for a walk when she tripped on the leash and fell onto her left side. patient denies head trauma or LOC. She is complaining of severe, 8 out of 10, constant, sharp pain located in the left hip that is worse with any movement. She denies neck pain, back pain, chest pain, abdominal pain, headache.  Past Medical History:  Diagnosis Date  . A-fib ([place])   . Allergic rhinitis   . Anxiety   . Chronic bronchitis ([place])   . CKD (chronic kidney disease), stage III ([place])   . Cornea scar   . Diverticulosis   . Hyperlipidemia   . Hypertension   . Osteoporosis   . Risk for falls   . Situational disturbance   . TIA (transient ischemic attack)     Patient Active Problem List   Diagnosis Date Noted  . Exposure to the flu 07/24/2016  . Hypertriglyceridemia 11/10/2015  . Hyperglycemia 11/10/2015  . Dysuria 11/10/2015  . Bronchitis 05/17/2015  . Cardiac murmur 04/07/2015  . Pelvic pressure in female 01/14/2015  . Feeling tired 01/14/2015  . Anxiety 01/05/2015  . Hyperlipidemia 01/05/2015  . Anticoagulation goal of INR 2 to 3 11/16/2014  . HTN (hypertension) 09/24/2014  . TIA (transient ischemic attack) 09/24/2014    Past Surgical History:  Procedure Laterality Date  . COLONOSCOPY  2016  . ESOPHAGUS SURGERY      Prior to Admission medications   Medication Sig Start Date End Date Taking? Authorizing Provider  albuterol (PROVENTIL HFA;VENTOLIN HFA) 108 (90 Base) MCG/ACT inhaler Inhale 2 puffs into the  lungs every 6 (six) hours as needed for wheezing or shortness of breath. 08/01/16   Roselee Nova, MD  ALPRAZolam Duanne Moron) 0.25 MG tablet Take 1 tablet (0.25 mg total) by mouth at bedtime as needed for anxiety. 05/11/16   Roselee Nova, MD  atenolol (TENORMIN) 25 MG tablet Take 1 tablet (25 mg total) by mouth daily. 11/10/16   Roselee Nova, MD  PARoxetine (PAXIL) 30 MG tablet Take 1 tablet (30 mg total) by mouth daily. 11/10/16   Roselee Nova, MD  pravastatin (PRAVACHOL) 40 MG tablet Take 1 tablet (40 mg total) by mouth daily. Please put on file. Patient will call when finishing what she have. 11/10/16   Roselee Nova, MD  warfarin (COUMADIN) 2 MG tablet Take 1 tablet (2 mg total) by mouth daily at 6 PM. 01/16/17   Roselee Nova, MD    Allergies Cefuroxime axetil; Ciprofloxacin; and Codeine  Family History  Problem Relation Age of Onset  . Hypertension Mother   . Other Mother        [place] posioning  . Heart attack Father   . Prostate cancer Father   . Kidney cancer Neg Hx   . Bladder Cancer Neg Hx     Social History Social History  Substance Use Topics  . Smoking status: Current Every Day Smoker    Packs/day: 0.25    Years: 61.00    Types: Cigarettes  . Smokeless  tobacco: Never Used  . Alcohol use No    Review of Systems Constitutional: Negative for fever. Eyes: Negative for visual changes. ENT: Negative for facial injury or neck injury Cardiovascular: Negative for chest injury. Respiratory: Negative for shortness of breath. Negative for chest wall injury. Gastrointestinal: Negative for abdominal pain or injury. Genitourinary: Negative for dysuria. Musculoskeletal: Negative for back injury, + L hip pain. Skin: Negative for laceration/abrasions. Neurological: Negative for head injury.  ___________________________________________   PHYSICAL EXAM:  VITAL SIGNS: ED Triage Vitals  Enc Vitals Group     BP 03/25/17 1619 123/69     Pulse Rate 03/25/17  1619 61     Resp 03/25/17 1619 18     Temp 03/25/17 1619 97.9 F (36.6 C)     Temp Source 03/25/17 1619 Oral     SpO2 03/25/17 1619 98 %     Weight 03/25/17 1621 131 lb (59.4 kg)     Height 03/25/17 1621 5\' 7"  (1.702 m)     Head Circumference --      Peak Flow --      Pain Score 03/25/17 1619 3     Pain Loc --      Pain Edu? --      Excl. in [place]? --    Full spinal precautions maintained throughout the trauma exam. Constitutional: Alert and oriented. No acute distress. Does not appear intoxicated. HEENT Head: Normocephalic and atraumatic. Face: No facial bony tenderness. Stable midface Ears: No hemotympanum bilaterally. No Battle sign Eyes: No eye injury. PERRL. No raccoon eyes Nose: Nontender. No epistaxis. No rhinorrhea Mouth/Throat: Mucous membranes are moist. No oropharyngeal blood. No dental injury. Airway patent without stridor. Normal voice. Neck: no C-collar in place. No midline c-spine tenderness.  Cardiovascular: Normal rate, regular rhythm. Normal and symmetric distal pulses are present in all extremities. Pulmonary/Chest: Chest wall is stable and nontender to palpation/compression. Normal respiratory effort. Breath sounds are normal. No crepitus.  Abdominal: Soft, nontender, non distended. Musculoskeletal: LLE is shortened and externally rotated with deformity to the Left hip. Nontender with normal full range of motion in all other extremities. No thoracic or lumbar midline spinal tenderness. Pelvis is stable. Skin: Skin is warm, dry and intact. No abrasions or contutions. Psychiatric: Speech and behavior are appropriate. Neurological: Normal speech and language. Moves all extremities to command. No gross focal neurologic deficits are appreciated.  Glascow Coma Score: 4 - Opens eyes on own 6 - Follows simple motor commands 5 - Alert and oriented GCS: 15  ____________________________________________   LABS (all labs ordered are listed, but only abnormal results are  displayed)  Labs Reviewed  CBC - Abnormal; Notable for the following:       Result Value   WBC 11.4 (*)    MCH 34.6 (*)    All other components within normal limits  BASIC METABOLIC PANEL - Abnormal; Notable for the following:    Glucose, Bld 107 (*)    Creatinine, Ser 1.61 (*)    GFR calc non Af Amer 30 (*)    GFR calc Af Amer 34 (*)    All other components within normal limits  PROTIME-INR - Abnormal; Notable for the following:    Prothrombin Time 21.9 (*)    All other components within normal limits  APTT  TYPE AND SCREEN  TYPE AND SCREEN   ____________________________________________  EKG  ED ECG REPORT I, Rudene Re, the attending physician, personally viewed and interpreted this ECG.  Normal sinus rhythm, rate of 63,  normal intervals, normal axis, no ST elevations or depressions, T-wave flattening in 1 and inverted in aVL and V2. no significant changes when compared to prior.  ____________________________________________  RADIOLOGY  XR hip: An acute, closed, varus angulated intertrochanteric fracture of the left femur is identified.  CXR: Mild hyperinflation of the lungs. Mild diffuse interstitial prominence may reflect chronic interstitial lung disease or fibrosis. No pneumonia.  CT head: 1. No acute intracranial process. 2. Generalized cerebral atrophy with moderate chronic small vessel ischemic disease.  CT cervical spine: 1. No acute traumatic injury within cervical spine. 2. Straightening of the normal cervical lordosis, which may be related to positioning and/or muscular spasm. 3. Grade 1 anterolisthesis C3 on C4 and C4 on C5, likely due to chronic facet degeneration. Moderate degenerative spondylolysis at C5-6 and C6-7. ____________________________________________   PROCEDURES  Procedure(s) performed: None Procedures Critical Care performed:  None ____________________________________________   INITIAL IMPRESSION / ASSESSMENT AND PLAN /  ED COURSE  76 y.o. female with a history of A. fib on Coumadin, chronic kidney disease, hypertension, hyperlipidemia who presents for evaluation of left hip pain status post mechanical fall.  x-ray confirms left hip fracture. LLE neurovascular intact. Consulted Dr. Daine Gip, ortho who recommended admission to Hospitalist for surgery in the morning. Labs, coags, CXR, and EKG for pre-op done. Will give morphine for pain. Family and patient updated.      _________________________ 6:48 PM on 03/25/2017 -----------------------------------------  CT head and cervical spine with no acute injuries. INR 1.93. Labs with stable creatinine and no anemia. Patient will be admitted to the hospitalist service.    As part of my medical decision making, I reviewed the following data within the [place] History obtained from family, Nursing notes reviewed and incorporated, Labs reviewed , EKG interpreted , Radiograph reviewed , Discussed with admitting physician , A consult was requested and obtained from this/these consultant(s) Orthopedics, Notes from prior ED visits and [place] Controlled Substance Database    Pertinent labs & imaging results that were available during my care of the patient were reviewed by me and considered in my medical decision making (see chart for details).    ____________________________________________   FINAL CLINICAL IMPRESSION(S) / ED DIAGNOSES  Final diagnoses:  Fall, initial encounter  Minor head injury, initial encounter  Closed fracture of left hip, initial encounter ([place])      NEW MEDICATIONS STARTED DURING THIS VISIT:  New Prescriptions   No medications on file     Note:  This document was prepared using Dragon voice recognition software and may include unintentional dictation errors.    Rudene Re, MD 03/25/17 [phone]

## 2017-03-25 NOTE — ED Notes (Signed)
Lab called stating t/s hemolyzed, redrawing

## 2017-03-25 NOTE — ED Notes (Signed)
Pt was assisted onto bedpan by this EDT

## 2017-03-25 NOTE — ED Notes (Signed)
Patient transported to CT 

## 2017-03-25 NOTE — Consult Note (Signed)
ORTHOPAEDIC CONSULTATION  REQUESTING PHYSICIAN: Rudene Re, MD  Chief Complaint: Left hip pain  HPI: Christine Burnett is a 76 y.o. female who complains of  Left hip pain and inability to bear weight after a ground level fall. History provided by daughter as patient was in the CT scanner at time of interview.  Past Medical History:  Diagnosis Date  . A-fib ([place])   . Allergic rhinitis   . Anxiety   . Chronic bronchitis ([place])   . CKD (chronic kidney disease), stage III ([place])   . Cornea scar   . Diverticulosis   . Hyperlipidemia   . Hypertension   . Osteoporosis   . Risk for falls   . Situational disturbance   . TIA (transient ischemic attack)    Past Surgical History:  Procedure Laterality Date  . COLONOSCOPY  2016  . ESOPHAGUS SURGERY     Social History   Social History  . Marital status: Divorced    Spouse name: N/A  . Number of children: N/A  . Years of education: N/A   Social History Main Topics  . Smoking status: Current Every Day Smoker    Packs/day: 0.25    Years: 61.00    Types: Cigarettes  . Smokeless tobacco: Never Used  . Alcohol use No  . Drug use: No  . Sexual activity: No   Other Topics Concern  . None   Social History Narrative  . None   Family History  Problem Relation Age of Onset  . Hypertension Mother   . Other Mother        Ureme[place] posioning  . Heart attack Father   . Prostate cancer Father   . Kidney cancer Neg Hx   . Bladder Cancer Neg Hx    Allergies  Allergen Reactions  . Cefuroxime Axetil     caused rash.  . Ciprofloxacin     Unknown  . Codeine    Prior to Admission medications   Medication Sig Start Date End Date Taking? Authorizing Provider  albuterol (PROVENTIL HFA;VENTOLIN HFA) 108 (90 Base) MCG/ACT inhaler Inhale 2 puffs into the lungs every 6 (six) hours as needed for wheezing or shortness of breath. 08/01/16   Roselee Nova, MD  ALPRAZolam Duanne Moron) 0.25 MG tablet Take 1 tablet (0.25 mg total) by mouth  at bedtime as needed for anxiety. 05/11/16   Roselee Nova, MD  atenolol (TENORMIN) 25 MG tablet Take 1 tablet (25 mg total) by mouth daily. 11/10/16   Roselee Nova, MD  PARoxetine (PAXIL) 30 MG tablet Take 1 tablet (30 mg total) by mouth daily. 11/10/16   Roselee Nova, MD  pravastatin (PRAVACHOL) 40 MG tablet Take 1 tablet (40 mg total) by mouth daily. Please put on file. Patient will call when finishing what she have. 11/10/16   Roselee Nova, MD  warfarin (COUMADIN) 2 MG tablet Take 1 tablet (2 mg total) by mouth daily at 6 PM. 01/16/17   Roselee Nova, MD   Dg Chest 1 View  Result Date: 03/25/2017 CLINICAL DATA:  Preop left hip fracture. EXAM: CHEST 1 VIEW COMPARISON:  07/02/2014 FINDINGS: The heart size and mediastinal contours are within normal limits. Aortic atherosclerosis at the arch. Lungs are hyperinflated and there is mild diffuse interstitial prominence which may reflect age related interstitial change or mild fibrosis. Right basilar subpleural atelectasis and/or scarring. No pneumonic consolidation, effusion or pneumothorax. The visualized skeletal structures are unremarkable. IMPRESSION: Mild  hyperinflation of the lungs. Mild diffuse interstitial prominence may reflect chronic interstitial lung disease or fibrosis. No pneumonia. Electronically Signed   By: Ashley Royalty M.D.   On: 03/25/2017 17:44   Dg Hip Unilat With Pelvis 2-3 Views Left  Result Date: 03/25/2017 CLINICAL DATA:  Pain after fall today. EXAM: DG HIP (WITH OR WITHOUT PELVIS) 2-3V LEFT COMPARISON:  None. FINDINGS: An acute, closed, varus angulated intertrochanteric fracture of the left femur is identified. No joint dislocations. The no acute displaced appearing pelvic fracture. 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 Degenerative spurring is noted off the acetabular roofs and both femoral heads. IMPRESSION: An acute, closed, varus angulated intertrochanteric fracture of the left femur is  identified. Electronically Signed   By: Ashley Royalty M.D.   On: 03/25/2017 17:38    Positive ROS: All other systems have been reviewed and were otherwise negative with the exception of those mentioned in the HPI and as above.  Physical Exam: General: Alert, no acute distress Cardiovascular: No pedal edema Respiratory: No cyanosis, no use of accessory musculature GI: No organomegaly, abdomen is soft and non-tender Skin: No lesions in the area of chief complaint Neurologic: Sensation intact distally Psychiatric: Patient is competent for consent with normal mood and affect Lymphatic: No axillary or cervical lymphadenopathy  MUSCULOSKELETAL: Deferred, patient in the CT scanner  Assessment: 76 y/o female with left intertrochanteric femur fracture  Plan: Admit to medicine for risk stratification and medical optimization. INR currently 1.9, goal of 1.5 or lower for surgery. Surgery tentatively scheduled for tomorrow.       03/25/2017 6:01 PM

## 2017-03-25 NOTE — ED Notes (Signed)
Patient @ Christine Burnett

## 2017-03-25 NOTE — ED Triage Notes (Addendum)
Patient was outside with her pit bull and got caught up in leash and fell on left hip.  EMS unable to determine if any ext rotation or shortening.  Patient in 3/10 pain and stating is is going from her hip to pelvis.  EMS administered 78mcg of Fentanyl en route to [facility]

## 2017-03-25 NOTE — ED Notes (Signed)
Lab bedside.

## 2017-03-25 NOTE — H&P (Signed)
[facility] NAME: Christine Burnett    MR#:  [id number]  DATE OF BIRTH:  Oct 30, 1940  DATE OF ADMISSION:  03/25/2017  PRIMARY CARE PHYSICIAN: Roselee Nova, MD   REQUESTING/REFERRING PHYSICIAN: Alfred Levins  CHIEF COMPLAINT:   Chief Complaint  Patient presents with  . Fall    HISTORY OF PRESENT ILLNESS: Christine Burnett  is a 76 y.o. female with a known history of CKD, TIA, Htn, HLD- Noted in past Hx - a fib- but ot refuse of having A fib, told " she takes Warfarin for carotid artery stenosis" Had an accidental fall due to loss of balance today. No LOC, Chest pain, palpitation- Have fractured femur and need to be admitted.  INR is 1.9 due to warfarin.  PAST MEDICAL HISTORY:   Past Medical History:  Diagnosis Date  . A-fib ([place])   . Allergic rhinitis   . Anxiety   . Chronic bronchitis ([place])   . CKD (chronic kidney disease), stage III ([place])   . Cornea scar   . Diverticulosis   . Hyperlipidemia   . Hypertension   . Osteoporosis   . Risk for falls   . Situational disturbance   . TIA (transient ischemic attack)     PAST SURGICAL HISTORY: Past Surgical History:  Procedure Laterality Date  . COLONOSCOPY  2016  . ESOPHAGUS SURGERY      SOCIAL HISTORY:  Social History  Substance Use Topics  . Smoking status: Current Every Day Smoker    Packs/day: 0.25    Years: 61.00    Types: Cigarettes  . Smokeless tobacco: Never Used  . Alcohol use No    FAMILY HISTORY:  Family History  Problem Relation Age of Onset  . Hypertension Mother   . Other Mother        [place] posioning  . Heart attack Father   . Prostate cancer Father   . Kidney cancer Neg Hx   . Bladder Cancer Neg Hx     DRUG ALLERGIES:  Allergies  Allergen Reactions  . Cefuroxime Axetil     caused rash.  . Ciprofloxacin     Unknown  . Codeine     REVIEW OF SYSTEMS:   CONSTITUTIONAL: No fever, fatigue or weakness.  EYES: No blurred or double  vision.  EARS, NOSE, AND THROAT: No tinnitus or ear pain.  RESPIRATORY: No cough, shortness of breath, wheezing or hemoptysis.  CARDIOVASCULAR: No chest pain, orthopnea, edema.  GASTROINTESTINAL: No nausea, vomiting, diarrhea or abdominal pain.  GENITOURINARY: No dysuria, hematuria.  ENDOCRINE: No polyuria, nocturia,  HEMATOLOGY: No anemia, easy bruising or bleeding SKIN: No rash or lesion. MUSCULOSKELETAL:  Left hip pain. NEUROLOGIC: No tingling, numbness, weakness.  PSYCHIATRY: No anxiety or depression.   MEDICATIONS AT HOME:  Prior to Admission medications   Medication Sig Start Date End Date Taking? Authorizing Provider  atenolol (TENORMIN) 25 MG tablet Take 1 tablet (25 mg total) by mouth daily. 11/10/16  Yes Roselee Nova, MD  PARoxetine (PAXIL) 30 MG tablet Take 1 tablet (30 mg total) by mouth daily. 11/10/16  Yes Roselee Nova, MD  pravastatin (PRAVACHOL) 40 MG tablet Take 1 tablet (40 mg total) by mouth daily. Please put on file. Patient will call when finishing what she have. 11/10/16  Yes Roselee Nova, MD  warfarin (COUMADIN) 2 MG tablet Take 1 tablet (2 mg total) by mouth daily at 6 PM. 01/16/17  Yes Keith Rake  Asad A, MD  albuterol (PROVENTIL HFA;VENTOLIN HFA) 108 (90 Base) MCG/ACT inhaler Inhale 2 puffs into the lungs every 6 (six) hours as needed for wheezing or shortness of breath. 08/01/16   Roselee Nova, MD  ALPRAZolam Duanne Moron) 0.25 MG tablet Take 1 tablet (0.25 mg total) by mouth at bedtime as needed for anxiety. Patient not taking: Reported on 03/25/2017 05/11/16   Roselee Nova, MD      PHYSICAL EXAMINATION:   VITAL SIGNS: Blood pressure (!) 177/73, pulse 78, temperature 97.9 F (36.6 C), temperature source Oral, resp. rate 16, height 5\' 7"  (1.702 m), weight 59.4 kg (131 lb), SpO2 99 %.  GENERAL:  76 y.o.-year-old patient lying in the bed with no acute distress.  EYES: Pupils equal, round, reactive to light and accommodation. No scleral icterus.  Extraocular muscles intact.  HEENT: Head atraumatic, normocephalic. Oropharynx and nasopharynx clear.  NECK:  Supple, no jugular venous distention. No thyroid enlargement, no tenderness.  LUNGS: Normal breath sounds bilaterally, no wheezing, rales,rhonchi or crepitation. No use of accessory muscles of respiration.  CARDIOVASCULAR: S1, S2 normal. No murmurs, rubs, or gallops.  ABDOMEN: Soft, nontender, nondistended. Bowel sounds present. No organomegaly or mass.  EXTREMITIES: No pedal edema, cyanosis, or clubbing.  NEUROLOGIC: Cranial nerves II through XII are intact. Muscle strength 5/5 in all extremities, except not much moving Left Lower Limb due to pain secondary to fracture. Sensation intact. Gait not checked.  PSYCHIATRIC: The patient is alert and oriented x 3.  SKIN: No obvious rash, lesion, or ulcer.   LABORATORY PANEL:   CBC  Recent Labs Lab 03/25/17 1652  WBC 11.4*  HGB 13.9  HCT 40.1  PLT 211  MCV 99.8  MCH 34.6*  MCHC 34.6  RDW 13.5   ------------------------------------------------------------------------------------------------------------------  Chemistries   Recent Labs Lab 03/25/17 1652  NA 137  K 4.9  CL 104  CO2 25  GLUCOSE 107*  BUN 18  CREATININE 1.61*  CALCIUM 9.1   ------------------------------------------------------------------------------------------------------------------ estimated creatinine clearance is 27.4 mL/min (A) (by C-G formula based on SCr of 1.61 mg/dL (H)). ------------------------------------------------------------------------------------------------------------------ No results for input(s): TSH, T4TOTAL, T3FREE, THYROIDAB in the last 72 hours.  Invalid input(s): FREET3   Coagulation profile  Recent Labs Lab 03/25/17 1652  INR 1.93   ------------------------------------------------------------------------------------------------------------------- No results for input(s): DDIMER in the last 72  hours. -------------------------------------------------------------------------------------------------------------------  Cardiac Enzymes No results for input(s): CKMB, TROPONINI, MYOGLOBIN in the last 168 hours.  Invalid input(s): CK ------------------------------------------------------------------------------------------------------------------ Invalid input(s): POCBNP  ---------------------------------------------------------------------------------------------------------------  Urinalysis    Component Value Date/Time   COLORURINE YELLOW 02/23/2017 1426   APPEARANCEUR CLEAR 02/23/2017 1426   APPEARANCEUR Clear 01/28/2016 0841   LABSPEC 1.015 02/23/2017 1426   PHURINE 5.5 02/23/2017 1426   GLUCOSEU NEGATIVE 02/23/2017 1426   HGBUR SMALL (A) 02/23/2017 1426   BILIRUBINUR NEGATIVE 02/23/2017 1426   BILIRUBINUR Negative 01/28/2016 0841   KETONESUR NEGATIVE 02/23/2017 1426   PROTEINUR NEGATIVE 02/23/2017 1426   UROBILINOGEN 0.2 01/12/2016 1138   NITRITE NEGATIVE 02/23/2017 1426   LEUKOCYTESUR NEGATIVE 02/23/2017 1426   LEUKOCYTESUR Trace (A) 01/28/2016 0841     RADIOLOGY: Dg Chest 1 View  Result Date: 03/25/2017 CLINICAL DATA:  Preop left hip fracture. EXAM: CHEST 1 VIEW COMPARISON:  07/02/2014 FINDINGS: The heart size and mediastinal contours are within normal limits. Aortic atherosclerosis at the arch. Lungs are hyperinflated and there is mild diffuse interstitial prominence which may reflect age related interstitial change or mild fibrosis. Right basilar subpleural atelectasis and/or scarring. No  pneumonic consolidation, effusion or pneumothorax. The visualized skeletal structures are unremarkable. IMPRESSION: Mild hyperinflation of the lungs. Mild diffuse interstitial prominence may reflect chronic interstitial lung disease or fibrosis. No pneumonia. Electronically Signed   By: Ashley Royalty M.D.   On: 03/25/2017 17:44   Ct Head Wo Contrast  Result Date:  03/25/2017 CLINICAL DATA:  Initial evaluation for acute trauma, fall. EXAM: CT HEAD WITHOUT CONTRAST CT CERVICAL SPINE WITHOUT CONTRAST TECHNIQUE: Multidetector CT imaging of the head and cervical spine was performed following the standard protocol without intravenous contrast. Multiplanar CT image reconstructions of the cervical spine were also generated. COMPARISON:  Prior CT from 07/03/2014. FINDINGS: CT HEAD FINDINGS Brain: Generalized cerebral atrophy with moderate chronic small vessel ischemic disease. No acute intracranial hemorrhage. No evidence for acute large vessel territory infarct. No mass lesion, midline shift or mass effect. No hydrocephalus. No extra-axial fluid collection. Vascular: No hyperdense vessel. Scattered vascular calcifications noted within the carotid siphons. Skull: Scalp soft tissues and calvarium within normal limits. Sinuses/Orbits: Globes and orbital soft tissues normal. Scattered mucosal thickening within the ethmoidal air cells. Paranasal sinuses otherwise clear. No mastoid effusion. Other: None. CT CERVICAL SPINE FINDINGS Alignment: Straightening of the normal cervical lordosis. Grade 1 anterolisthesis of C3 on C4 and C4 on C5. Skull base and vertebrae: Skullbase intact. Normal C1-2 articulations are preserved in the dens is intact. Vertebral body heights maintained. No acute fracture. Soft tissues and spinal canal: Soft tissues of the neck demonstrate no acute abnormality. No prevertebral edema. Vascular calcifications about the carotid bifurcations. Disc levels: Moderate degenerative spondylolysis present at C5-6 and C6-7. Multilevel facet arthrosis noted, greatest within the upper cervical spine. Left C2-3 facets are fused. Upper chest: Visualized upper chest demonstrates no acute abnormality. Paraseptal emphysema noted at the lung apices. Other: None. IMPRESSION: CT BRAIN: 1. No acute intracranial process. 2. Generalized cerebral atrophy with moderate chronic small vessel  ischemic disease. CT CERVICAL SPINE: 1. No acute traumatic injury within cervical spine. 2. Straightening of the normal cervical lordosis, which may be related to positioning and/or muscular spasm. 3. Grade 1 anterolisthesis C3 on C4 and C4 on C5, likely due to chronic facet degeneration. Moderate degenerative spondylolysis at C5-6 and C6-7. Electronically Signed   By: Jeannine Boga M.D.   On: 03/25/2017 18:38   Ct Cervical Spine Wo Contrast  Result Date: 03/25/2017 CLINICAL DATA:  Initial evaluation for acute trauma, fall. EXAM: CT HEAD WITHOUT CONTRAST CT CERVICAL SPINE WITHOUT CONTRAST TECHNIQUE: Multidetector CT imaging of the head and cervical spine was performed following the standard protocol without intravenous contrast. Multiplanar CT image reconstructions of the cervical spine were also generated. COMPARISON:  Prior CT from 07/03/2014. FINDINGS: CT HEAD FINDINGS Brain: Generalized cerebral atrophy with moderate chronic small vessel ischemic disease. No acute intracranial hemorrhage. No evidence for acute large vessel territory infarct. No mass lesion, midline shift or mass effect. No hydrocephalus. No extra-axial fluid collection. Vascular: No hyperdense vessel. Scattered vascular calcifications noted within the carotid siphons. Skull: Scalp soft tissues and calvarium within normal limits. Sinuses/Orbits: Globes and orbital soft tissues normal. Scattered mucosal thickening within the ethmoidal air cells. Paranasal sinuses otherwise clear. No mastoid effusion. Other: None. CT CERVICAL SPINE FINDINGS Alignment: Straightening of the normal cervical lordosis. Grade 1 anterolisthesis of C3 on C4 and C4 on C5. Skull base and vertebrae: Skullbase intact. Normal C1-2 articulations are preserved in the dens is intact. Vertebral body heights maintained. No acute fracture. Soft tissues and spinal canal: Soft tissues of the neck  demonstrate no acute abnormality. No prevertebral edema. Vascular  calcifications about the carotid bifurcations. Disc levels: Moderate degenerative spondylolysis present at C5-6 and C6-7. Multilevel facet arthrosis noted, greatest within the upper cervical spine. Left C2-3 facets are fused. Upper chest: Visualized upper chest demonstrates no acute abnormality. Paraseptal emphysema noted at the lung apices. Other: None. IMPRESSION: CT BRAIN: 1. No acute intracranial process. 2. Generalized cerebral atrophy with moderate chronic small vessel ischemic disease. CT CERVICAL SPINE: 1. No acute traumatic injury within cervical spine. 2. Straightening of the normal cervical lordosis, which may be related to positioning and/or muscular spasm. 3. Grade 1 anterolisthesis C3 on C4 and C4 on C5, likely due to chronic facet degeneration. Moderate degenerative spondylolysis at C5-6 and C6-7. Electronically Signed   By: Jeannine Boga M.D.   On: 03/25/2017 18:38   Dg Hip Unilat With Pelvis 2-3 Views Left  Result Date: 03/25/2017 CLINICAL DATA:  Pain after fall today. EXAM: DG HIP (WITH OR WITHOUT PELVIS) 2-3V LEFT COMPARISON:  None. FINDINGS: An acute, closed, varus angulated intertrochanteric fracture of the left femur is identified. No joint dislocations. The no acute displaced appearing pelvic fracture. 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 Degenerative spurring is noted off the acetabular roofs and both femoral heads. IMPRESSION: An acute, closed, varus angulated intertrochanteric fracture of the left femur is identified. Electronically Signed   By: Ashley Royalty M.D.   On: 03/25/2017 17:38    EKG: Orders placed or performed during the hospital encounter of 03/25/17  . ED EKG  . ED EKG  . EKG 12-Lead  . EKG 12-Lead    IMPRESSION AND PLAN:  * Hip fracture   Ortho consult    called cardio assessment as pt have Htn, HLD, family hx of CAD and she is a smoker.  * coagulopathy    INR 1.9    Due to warfarin.    Oral vit K, check tomorrow.  *  carotid artery stenosis    Pt claims having TIA due to that and on coumadin for this.    Refuses having A fib.    Hold coumadin for now.  * Htn   Stable     Cont home meds  * Hyperlipidemia    Cont home meds.  * Active smoking   [place] to quit for 4 min.   She have family hx of CAD. Cardio consult and echo.  All the records are reviewed and case discussed with ED provider. Management plans discussed with the patient, family and they are in agreement.  CODE STATUS: Full. Code Status History    This patient does not have a recorded code status. Please follow your organizational policy for patients in this situation.    Advance Directive Documentation     Most Recent Value  Type of Advance Directive  Healthcare Power of Attorney  Pre-existing out of facility DNR order (yellow form or pink MOST form)  -  "MOST" Form in Place?  -     Daughter in room.  TOTAL TIME TAKING CARE OF THIS PATIENT: 50 minutes.    [name] M.D on 03/25/2017   Between 7am to 6pm - Pager - [phone]  After 6pm go to [email or web address] - password [email or web address] [facility]  Office  [phone]  CC: Primary care physician; Roselee Nova, MD   Note: This dictation was prepared with Dragon dictation along with smaller phrase technology. Any transcriptional errors that result from this process are  unintentional.

## 2017-03-25 NOTE — ED Notes (Signed)
Lab called and said the redraw of patient t/s is also hemolyzed.  I asked them to come and get it from patient.

## 2017-03-25 NOTE — ED Notes (Signed)
Family at bedside. 

## 2017-03-26 ENCOUNTER — Inpatient Hospital Stay: Payer: Medicare Other | Admitting: Anesthesiology

## 2017-03-26 ENCOUNTER — Encounter
Admission: EM | Disposition: A | Discharge status: Skilled Nursing Facility | Payer: Self-pay | Source: Home / Self Care | Attending: Internal Medicine

## 2017-03-26 ENCOUNTER — Inpatient Hospital Stay: Payer: Medicare Other

## 2017-03-26 HISTORY — PX: INTRAMEDULLARY (IM) NAIL INTERTROCHANTERIC: SHX5875

## 2017-03-26 LAB — BASIC METABOLIC PANEL
ANION GAP: 6 (ref 5–15)
BUN: 17 mg/dL (ref 6–20)
CALCIUM: 9.2 mg/dL (ref 8.9–10.3)
CO2: 27 mmol/L (ref 22–32)
CREATININE: 1.54 mg/dL — AB (ref 0.44–1.00)
Chloride: 106 mmol/L (ref 101–111)
GFR, EST AFRICAN AMERICAN: 36 mL/min — AB (ref >60)
GFR, EST NON AFRICAN AMERICAN: 31 mL/min — AB (ref >60)
Glucose, Bld: 132 mg/dL — ABNORMAL HIGH (ref 65–99)
Potassium: 4.8 mmol/L (ref 3.5–5.1)
Sodium: 139 mmol/L (ref 135–145)

## 2017-03-26 LAB — CBC
HCT: 36.1 % (ref 35.0–47.0)
HEMOGLOBIN: 12.4 g/dL (ref 12.0–16.0)
MCH: 34.6 pg — ABNORMAL HIGH (ref 26.0–34.0)
MCHC: 34.2 g/dL (ref 32.0–36.0)
MCV: 101.1 fL — ABNORMAL HIGH (ref 80.0–100.0)
PLATELETS: 176 10*3/uL (ref 150–440)
RBC: 3.57 MIL/uL — AB (ref 3.80–5.20)
RDW: 13.8 % (ref 11.5–14.5)
WBC: 9 10*3/uL (ref 3.6–11.0)

## 2017-03-26 LAB — PROTIME-INR
INR: 2.03
INR: 1.67
INR: 1.5
PROTHROMBIN TIME: 22.8 s — AB (ref 11.4–15.2)
Prothrombin Time: 19.6 seconds — ABNORMAL HIGH (ref 11.4–15.2)
Prothrombin Time: 18 seconds — ABNORMAL HIGH (ref 11.4–15.2)

## 2017-03-26 SURGERY — FIXATION, FRACTURE, INTERTROCHANTERIC, WITH INTRAMEDULLARY ROD
Anesthesia: General | Laterality: Left

## 2017-03-26 MED ORDER — EPHEDRINE SULFATE 50 MG/ML IJ SOLN
INTRAMUSCULAR | Status: DC | PRN
  Administered 2017-03-26 (×2): 15 mg via INTRAVENOUS

## 2017-03-26 MED ORDER — PROPOFOL 10 MG/ML IV BOLUS
INTRAVENOUS | Status: AC
Start: 2017-03-26 — End: ?
  Filled 2017-03-26: qty 20

## 2017-03-26 MED ORDER — ACETAMINOPHEN 10 MG/ML IV SOLN
INTRAVENOUS | Status: AC
  Filled 2017-03-26: qty 100

## 2017-03-26 MED ORDER — ROCURONIUM BROMIDE 50 MG/5ML IV SOLN
INTRAVENOUS | Status: AC
  Filled 2017-03-26: qty 1

## 2017-03-26 MED ORDER — WARFARIN SODIUM 2 MG PO TABS
2.0000 mg | ORAL_TABLET | Freq: Every day | ORAL | Status: DC
  Administered 2017-03-26 – 2017-03-27 (×2): 2 mg via ORAL
  Filled 2017-03-26 (×2): qty 1

## 2017-03-26 MED ORDER — BISACODYL 10 MG RE SUPP: 10.0000 mg | Freq: Every day | RECTAL | Status: DC | PRN

## 2017-03-26 MED ORDER — MAGNESIUM HYDROXIDE 400 MG/5ML PO SUSP
30.0000 mL | Freq: Every day | ORAL | Status: DC | PRN
  Administered 2017-03-27 – 2017-03-28 (×2): 30 mL via ORAL
  Filled 2017-03-26 (×2): qty 30

## 2017-03-26 MED ORDER — SODIUM CHLORIDE 0.9 % IV SOLN
INTRAVENOUS | Status: DC
  Administered 2017-03-26: 22:00:00 via INTRAVENOUS

## 2017-03-26 MED ORDER — WARFARIN - PHARMACIST DOSING INPATIENT
Freq: Every day | Status: DC
  Administered 2017-03-28: 18:00:00

## 2017-03-26 MED ORDER — ONDANSETRON HCL 4 MG PO TABS: 4.0000 mg | ORAL_TABLET | Freq: Four times a day (QID) | ORAL | Status: DC | PRN

## 2017-03-26 MED ORDER — CLINDAMYCIN PHOSPHATE 600 MG/50ML IV SOLN
600.0000 mg | Freq: Once | INTRAVENOUS | Status: AC
  Administered 2017-03-26: 600 mg via INTRAVENOUS
  Filled 2017-03-26: qty 50

## 2017-03-26 MED ORDER — FENTANYL CITRATE (PF) 100 MCG/2ML IJ SOLN
INTRAMUSCULAR | Status: AC
  Filled 2017-03-26: qty 2

## 2017-03-26 MED ORDER — ENOXAPARIN SODIUM 30 MG/0.3ML ~~LOC~~ SOLN
30.0000 mg | SUBCUTANEOUS | Status: DC
  Administered 2017-03-27: 30 mg via SUBCUTANEOUS
  Filled 2017-03-26: qty 0.3

## 2017-03-26 MED ORDER — PHENOL 1.4 % MT LIQD: 1.0000 | OROMUCOSAL | Status: DC | PRN

## 2017-03-26 MED ORDER — FENTANYL CITRATE (PF) 100 MCG/2ML IJ SOLN
INTRAMUSCULAR | Status: AC
  Administered 2017-03-26: 25 ug via INTRAVENOUS
  Filled 2017-03-26: qty 2

## 2017-03-26 MED ORDER — ONDANSETRON HCL 4 MG/2ML IJ SOLN
INTRAMUSCULAR | Status: DC | PRN
  Administered 2017-03-26: 4 mg via INTRAVENOUS

## 2017-03-26 MED ORDER — PROPOFOL 10 MG/ML IV BOLUS
INTRAVENOUS | Status: DC | PRN
  Administered 2017-03-26: 100 mg via INTRAVENOUS

## 2017-03-26 MED ORDER — LIDOCAINE HCL (CARDIAC) 20 MG/ML IV SOLN
INTRAVENOUS | Status: DC | PRN
  Administered 2017-03-26: 80 mg via INTRAVENOUS

## 2017-03-26 MED ORDER — ZOLPIDEM TARTRATE 5 MG PO TABS: 5.0000 mg | ORAL_TABLET | Freq: Every evening | ORAL | Status: DC | PRN

## 2017-03-26 MED ORDER — MIDAZOLAM HCL 2 MG/2ML IJ SOLN
INTRAMUSCULAR | Status: AC
  Filled 2017-03-26: qty 2

## 2017-03-26 MED ORDER — PROMETHAZINE HCL 25 MG/ML IJ SOLN: 6.2500 mg | INTRAMUSCULAR | Status: DC | PRN

## 2017-03-26 MED ORDER — DEXAMETHASONE SODIUM PHOSPHATE 10 MG/ML IJ SOLN
INTRAMUSCULAR | Status: DC | PRN
  Administered 2017-03-26: 10 mg via INTRAVENOUS

## 2017-03-26 MED ORDER — ONDANSETRON HCL 4 MG/2ML IJ SOLN: 4.0000 mg | Freq: Four times a day (QID) | INTRAMUSCULAR | Status: DC | PRN

## 2017-03-26 MED ORDER — ACETAMINOPHEN 10 MG/ML IV SOLN
INTRAVENOUS | Status: DC | PRN
  Administered 2017-03-26: 1000 mg via INTRAVENOUS

## 2017-03-26 MED ORDER — METOCLOPRAMIDE HCL 10 MG PO TABS: 5.0000 mg | ORAL_TABLET | Freq: Three times a day (TID) | ORAL | Status: DC | PRN

## 2017-03-26 MED ORDER — ALUM & MAG HYDROXIDE-SIMETH 200-200-20 MG/5ML PO SUSP: 30.0000 mL | ORAL | Status: DC | PRN

## 2017-03-26 MED ORDER — METOCLOPRAMIDE HCL 5 MG/ML IJ SOLN: 5.0000 mg | Freq: Three times a day (TID) | INTRAMUSCULAR | Status: DC | PRN

## 2017-03-26 MED ORDER — MENTHOL 3 MG MT LOZG: 1.0000 | LOZENGE | OROMUCOSAL | Status: DC | PRN

## 2017-03-26 MED ORDER — CLINDAMYCIN PHOSPHATE 600 MG/50ML IV SOLN
600.0000 mg | Freq: Four times a day (QID) | INTRAVENOUS | Status: AC
  Administered 2017-03-26 – 2017-03-27 (×2): 600 mg via INTRAVENOUS
  Filled 2017-03-26 (×2): qty 50

## 2017-03-26 MED ORDER — ONDANSETRON HCL 4 MG/2ML IJ SOLN
INTRAMUSCULAR | Status: AC
  Filled 2017-03-26: qty 2

## 2017-03-26 MED ORDER — VITAMIN K1 10 MG/ML IJ SOLN
5.0000 mg | Freq: Once | INTRAVENOUS | Status: AC
  Administered 2017-03-26: 5 mg via INTRAVENOUS
  Filled 2017-03-26: qty 0.5

## 2017-03-26 MED ORDER — LACTATED RINGERS IV SOLN
INTRAVENOUS | Status: DC | PRN
  Administered 2017-03-26: 16:00:00 via INTRAVENOUS

## 2017-03-26 MED ORDER — HYDROCODONE-ACETAMINOPHEN 5-325 MG PO TABS
1.0000 | ORAL_TABLET | Freq: Four times a day (QID) | ORAL | Status: DC | PRN
  Administered 2017-03-26 – 2017-03-27 (×2): 1 via ORAL
  Filled 2017-03-26 (×2): qty 1

## 2017-03-26 MED ORDER — LIDOCAINE HCL (PF) 2 % IJ SOLN
INTRAMUSCULAR | Status: AC
Start: 2017-03-26 — End: ?
  Filled 2017-03-26: qty 10

## 2017-03-26 MED ORDER — MAGNESIUM CITRATE PO SOLN: 1.0000 | Freq: Once | ORAL | Status: DC | PRN

## 2017-03-26 MED ORDER — FENTANYL CITRATE (PF) 100 MCG/2ML IJ SOLN
INTRAMUSCULAR | Status: DC | PRN
  Administered 2017-03-26 (×2): 25 ug via INTRAVENOUS
  Administered 2017-03-26: 50 ug via INTRAVENOUS

## 2017-03-26 MED ORDER — DEXAMETHASONE SODIUM PHOSPHATE 10 MG/ML IJ SOLN
INTRAMUSCULAR | Status: AC
  Filled 2017-03-26: qty 1

## 2017-03-26 MED ORDER — FENTANYL CITRATE (PF) 100 MCG/2ML IJ SOLN
25.0000 ug | INTRAMUSCULAR | Status: DC | PRN
  Administered 2017-03-26 (×2): 25 ug via INTRAVENOUS

## 2017-03-26 SURGICAL SUPPLY — 30 items
BIT DRILL CANN LG 4.3MM (BIT) ×1 IMPLANT
CANISTER SUCT 1200ML W/VALVE (MISCELLANEOUS) ×3 IMPLANT
CHLORAPREP W/TINT 26ML (MISCELLANEOUS) ×3 IMPLANT
DRAPE SHEET LG 3/4 BI-LAMINATE (DRAPES) ×3 IMPLANT
DRAPE U-SHAPE 47X51 STRL (DRAPES) ×3 IMPLANT
DRILL BIT CANN LG 4.3MM (BIT) ×3
DRSG OPSITE POSTOP 3X4 (GAUZE/BANDAGES/DRESSINGS) ×9 IMPLANT
GLOVE BIOGEL PI IND STRL 9 (GLOVE) ×1 IMPLANT
GLOVE BIOGEL PI INDICATOR 9 (GLOVE) ×2
GLOVE SURG SYN 9.0  PF PI (GLOVE) ×2
GLOVE SURG SYN 9.0 PF PI (GLOVE) ×1 IMPLANT
GOWN SRG 2XL LVL 4 RGLN SLV (GOWNS) ×1 IMPLANT
GOWN STRL NON-REIN 2XL LVL4 (GOWNS) ×2
GOWN STRL REUS W/ TWL LRG LVL3 (GOWN DISPOSABLE) ×1 IMPLANT
GOWN STRL REUS W/TWL LRG LVL3 (GOWN DISPOSABLE) ×2
GUIDEPIN VERSANAIL DSP 3.2X444 (ORTHOPEDIC DISPOSABLE SUPPLIES) ×3 IMPLANT
HFN 125 DEG 9MM X 180MM (Nail) ×3 IMPLANT
HIP FRA NAIL LAG SCREW 10.5X90 (Orthopedic Implant) ×3 IMPLANT
KIT RM TURNOVER STRD PROC AR (KITS) ×3 IMPLANT
MAT BLUE FLOOR 46X72 FLO (MISCELLANEOUS) ×3 IMPLANT
NEEDLE FILTER BLUNT 18X 1/2SAF (NEEDLE) ×2
NEEDLE FILTER BLUNT 18X1 1/2 (NEEDLE) ×1 IMPLANT
NS IRRIG 500ML POUR BTL (IV SOLUTION) ×3 IMPLANT
PACK HIP COMPR (MISCELLANEOUS) ×3 IMPLANT
SCREW BONE CORTICAL 5.0X3 (Screw) ×3 IMPLANT
SCREW LAG HIP FRA NAIL 10.5X90 (Orthopedic Implant) ×1 IMPLANT
STAPLER SKIN PROX 35W (STAPLE) ×3 IMPLANT
SUT VIC AB 1 CT1 36 (SUTURE) ×3 IMPLANT
SUT VIC AB 2-0 CT1 (SUTURE) ×3 IMPLANT
SYRINGE 10CC LL (SYRINGE) ×3 IMPLANT

## 2017-03-26 NOTE — Op Note (Signed)
03/25/2017 - 03/26/2017  5:16 PM  PATIENT:  Christine Burnett  76 y.o. female  PRE-OPERATIVE DIAGNOSIS:  Left Intertrochantric hip fracture  POST-OPERATIVE DIAGNOSIS:  Left Intertrochantric hip fracture  PROCEDURE:  Procedure(s): INTRAMEDULLARY (IM) NAIL INTERTROCHANTRIC (Left)  SURGEON: Laurene Footman, MD  ASSISTANTS: none  ANESTHESIA:   general  EBL:  Total I/O In: -  Out: 700 [Urine:700]  BLOOD ADMINISTERED:none  DRAINS: none   LOCAL MEDICATIONS USED:  NONE  SPECIMEN:  No Specimen  DISPOSITION OF SPECIMEN:  N/A  COUNTS:  YES  TOURNIQUET:  * No tourniquets in log *  IMPLANTS: biomet 125 degree short rod and 85 mm lag screw, 36 mm interlocking screw  DICTATION: .Dragon Dictation   Patient brought the operating room and after adequate general anesthesia was obtained the patient was placed on the fracture table with the right leg in the well-leg holder right foot in the traction boot traction and internal rotation applied. C-arm was brought in and anatomic alignment was obtained*the case. After prepping draping the sterile fashion appropriate patient identification and timeout procedures were completed. A small incision was made proximal to the greater trochanter guide were inserted into the greater trochanter followed by proximal reaming and placement of the 9 mm rod. after inserting the rod guidewires inserted center center in the head measurement made followed by drilling placement of a 85 mm lag screw followed by compression to the compression device after traction was released with the foot. The proximal set screw was tightened and a quarter turn loosening to allow for compression and the distal interlocking screw was then placed with drilling and placing the 5.0 cortical screw at this point the insertion handle was removed and permanent AP lateral images obtained. Wounds were irrigated and closed with #1 Vicryl for the deep fascia proximally 2-0 Vicryl subcutaneously and  skin staples followed by Xeroform and honeycomb dressing PLAN OF CARE: rcontinue inpatient  PATIENT DISPOSITION:  PACU - hemodynamically stable.

## 2017-03-26 NOTE — Anesthesia Post-op Follow-up Note (Signed)
Anesthesia QCDR form completed.        

## 2017-03-26 NOTE — Anesthesia Procedure Notes (Signed)
Procedure Name: LMA Insertion Date/Time: 03/26/2017 4:35 PM Performed by: Jonna Clark Pre-anesthesia Checklist: Patient identified, Patient being monitored, Timeout performed, Emergency Drugs available and Suction available Patient Re-evaluated:Patient Re-evaluated prior to induction Oxygen Delivery Method: Circle system utilized Preoxygenation: Pre-oxygenation with 100% oxygen Induction Type: IV induction Ventilation: Mask ventilation without difficulty LMA: LMA inserted LMA Size: 4.0 Tube type: Oral Number of attempts: 1 Placement Confirmation: positive ETCO2 and breath sounds checked- equal and bilateral Tube secured with: Tape Dental Injury: Teeth and Oropharynx as per pre-operative assessment

## 2017-03-26 NOTE — Anesthesia Preprocedure Evaluation (Signed)
Anesthesia Evaluation  Patient identified by MRN, date of birth, ID band Patient awake    Reviewed: Allergy & Precautions, H&P , NPO status , Patient's Chart, lab work & pertinent test results, reviewed documented beta blocker date and time   History of Anesthesia Complications Negative for: history of anesthetic complications  Airway Mallampati: III  TM Distance: >3 FB Neck ROM: full    Dental no notable dental hx. (+) Edentulous Lower, Missing, Dental Advidsory Given   Pulmonary neg shortness of breath, neg COPD, neg recent URI, Current Smoker,           Cardiovascular Exercise Tolerance: Good hypertension, (-) angina(-) CAD, (-) Past MI, (-) Cardiac Stents and (-) CABG (-) dysrhythmias + Valvular Problems/Murmurs      Neuro/Psych neg Seizures TIAnegative psych ROS   GI/Hepatic negative GI ROS, Neg liver ROS,   Endo/Other  negative endocrine ROS  Renal/GU CRFRenal disease  negative genitourinary   Musculoskeletal   Abdominal   Peds  Hematology negative hematology ROS (+)   Anesthesia Other Findings Past Medical History: No date: A-fib ([place]) No date: Allergic rhinitis No date: Anxiety No date: Chronic bronchitis ([facility]) No date: CKD (chronic kidney disease), stage III ([facility]) No date: Cornea scar No date: Diverticulosis No date: Hyperlipidemia No date: Hypertension No date: Osteoporosis No date: Risk for falls No date: Situational disturbance No date: TIA (transient ischemic attack)   Reproductive/Obstetrics negative OB ROS                             Anesthesia Physical Anesthesia Plan  ASA: III  Anesthesia Plan: General   Post-op Pain Management:    Induction: Intravenous  PONV Risk Score and Plan: 2 and Ondansetron and Dexamethasone  Airway Management Planned: LMA  Additional Equipment:   Intra-op Plan:   Post-operative Plan: Extubation in OR  Informed Consent: I  have reviewed the patients History and Physical, chart, labs and discussed the procedure including the risks, benefits and alternatives for the proposed anesthesia with the patient or authorized representative who has indicated his/her understanding and acceptance.   Dental Advisory Given  Plan Discussed with: Anesthesiologist, CRNA and Surgeon  Anesthesia Plan Comments:         Anesthesia Quick Evaluation

## 2017-03-26 NOTE — Progress Notes (Signed)
Patient urgently needs hip repair, will proceed without echo or cardiology consult.  Emergency surgery.

## 2017-03-26 NOTE — Anesthesia Postprocedure Evaluation (Signed)
Anesthesia Post Note  Patient: Christine Burnett  Procedure(s) Performed: INTRAMEDULLARY (IM) NAIL INTERTROCHANTRIC (Left )  Patient location during evaluation: PACU Anesthesia Type: General Level of consciousness: awake and alert Pain management: pain level controlled Vital Signs Assessment: post-procedure vital signs reviewed and stable Respiratory status: spontaneous breathing, nonlabored ventilation, respiratory function stable and patient connected to nasal cannula oxygen Cardiovascular status: blood pressure returned to baseline and stable Postop Assessment: no apparent nausea or vomiting Anesthetic complications: no     Last Vitals:  Vitals:   03/26/17 1754 03/26/17 1803  BP:  (!) 141/82  Pulse: 81 71  Resp: 20 16  Temp:    SpO2: 90% 94%    Last Pain:  Vitals:   03/26/17 1803  TempSrc:   PainSc: 3                  Martha Clan

## 2017-03-26 NOTE — Clinical Social Work Note (Signed)
Clinical Social Work Assessment  Patient Details  Name: Christine Burnett MRN: [id number] Date of Birth: 1940-06-18  Date of referral:  03/26/17               Reason for consult:  Facility Placement                Permission sought to share information with:  Chartered certified accountant granted to share information::  Yes, Verbal Permission Granted  Name::      [place]::   [place]   Relationship::     Contact Information:     Housing/Transportation Living arrangements for the past 2 months:  [place] of Information:  Patient, Adult Children Patient Interpreter Needed:  None Criminal Activity/Legal Involvement Pertinent to Current Situation/Hospitalization:  No - Comment as needed Significant Relationships:  Adult Children Lives with:  Adult Children Do you feel safe going back to the place where you live?  Yes Need for family participation in patient care:  Yes (Comment)  Care giving concerns:  Patient lives in [place] with her adult daughter Christine Burnett and 3 grandchildren.    Social Worker assessment / plan:  Holiday representative (CSW) reviewed chart and noted that patient will have surgery for a hip fracture. CSW met with patient and her daughter/ HPOA Christine Burnett [phone] was at bedside. CSW introduced self and explained role of CSW department. Patient was alert and oriented X4 and was laying in the bed. Per patient she lives in [place] and with daughter and grandchildren. CSW explained that PT will work with patient after surgery and make a recommendation of home health or SNF. Patient reported that she is willing to do whatever PT recommends is best for her. CSW explained that medicare requires a 3 night qualifying inpatient stay at a hospital in order to pay for SNF. Patient was admitted to inpatient on 03/25/17. Patient and her daughter verbalized this understanding and are agreeable to SNF search in [place]. FL2  complete and faxed out. CSW will continue to follow and assist as needed.   Employment status:  Retired Forensic scientist:  Medicare PT Recommendations:  [place] / Referral to community resources:  [place]  Patient/Family's Response to care:  Patient and her daughter are agreeable to SNF search in [place].   Patient/Family's Understanding of and Emotional Response to Diagnosis, Current Treatment, and Prognosis:  Patient and her daughter were very pleasant and thanked CSW for assistance.   Emotional Assessment Appearance:  Appears stated age Attitude/Demeanor/Rapport:    Affect (typically observed):  Accepting, Adaptable, Pleasant Orientation:  Oriented to Self, Oriented to Place, Oriented to  Time, Oriented to Situation Alcohol / Substance use:  Not Applicable Psych involvement (Current and /or in the community):  No (Comment)  Discharge Needs  Concerns to be addressed:  Discharge Planning Concerns Readmission within the last 30 days:  No Current discharge risk:  Dependent with Mobility Barriers to Discharge:  Continued Medical Work up   [employer], Veronia Beets, LCSW 03/26/2017, 2:34 PM

## 2017-03-26 NOTE — NC FL2 (Signed)
[place] LEVEL OF CARE SCREENING TOOL     IDENTIFICATION  Patient Name: CHRISTIANNA BELMONTE Birthdate: 07/16/1940 Sex: female Admission Date (Current Location): 03/25/2017  [place] and [place] Number:  Engineering geologist and Address:  [facility], [address] [place], [place] [address]      Provider Number: [id number]  Attending Physician Name and Address:  Nicholes Mango, MD  Relative Name and Phone Number:       Current Level of Care: Hospital Recommended Level of Care: [place] Prior Approval Number:    Date Approved/Denied:   PASRR Number:  [id number])  Discharge Plan: SNF    Current Diagnoses: Patient Active Problem List   Diagnosis Date Noted  . Hip fracture, unspecified laterality, closed, initial encounter ([place]) 03/25/2017  . Hip fracture ([place]) 03/25/2017  . Exposure to the flu 07/24/2016  . Hypertriglyceridemia 11/10/2015  . Hyperglycemia 11/10/2015  . Dysuria 11/10/2015  . Bronchitis 05/17/2015  . Cardiac murmur 04/07/2015  . Pelvic pressure in female 01/14/2015  . Feeling tired 01/14/2015  . Anxiety 01/05/2015  . Hyperlipidemia 01/05/2015  . Anticoagulation goal of INR 2 to 3 11/16/2014  . HTN (hypertension) 09/24/2014  . TIA (transient ischemic attack) 09/24/2014    Orientation RESPIRATION BLADDER Height & Weight     Self, Time, Situation, Place  Normal Continent Weight: 131 lb (59.4 kg) Height:  5\' 7"  (170.2 cm)  BEHAVIORAL SYMPTOMS/MOOD NEUROLOGICAL BOWEL NUTRITION STATUS      Continent Diet (Diet: NPO for surgery to be advanced. )  AMBULATORY STATUS COMMUNICATION OF NEEDS Skin   Extensive Assist Verbally Surgical wounds                       Personal Care Assistance Level of Assistance  Bathing, Feeding, Dressing Bathing Assistance: Limited assistance Feeding assistance: Independent Dressing Assistance: Limited assistance     Functional Limitations Info  Sight,  Hearing, Speech Sight Info: Adequate Hearing Info: Adequate Speech Info: Adequate    SPECIAL CARE FACTORS FREQUENCY  PT (By licensed PT), OT (By licensed OT)     PT Frequency:  (5) OT Frequency:  (5)            Contractures      Additional Factors Info  Code Status, Allergies Code Status Info:  (Full Code. ) Allergies Info:  (Cefuroxime Axetil, Ciprofloxacin, Codeine)           Current Medications (03/26/2017):  This is the current hospital active medication list Current Facility-Administered Medications  Medication Dose Route Frequency Provider Last Rate Last Dose  . acetaminophen (TYLENOL) tablet 650 mg  650 mg Oral Q6H PRN [name], MD      . ALPRAZolam Duanne Moron) tablet 0.25 mg  0.25 mg Oral QHS PRN [name], MD      . atenolol (TENORMIN) tablet 25 mg  25 mg Oral Daily [name], MD   25 mg at 03/26/17 0944  . clindamycin (CLEOCIN) IVPB 600 mg  600 mg Intravenous Once Hessie Knows, MD      . docusate sodium (COLACE) capsule 100 mg  100 mg Oral BID PRN [name], MD      . docusate sodium (COLACE) capsule 100 mg  100 mg Oral BID [name], MD   100 mg at 03/25/17 2127  . morphine 2 MG/ML injection 2 mg  2 mg Intravenous Q4H PRN [name], MD   2 mg at 03/26/17 0943  . PARoxetine (PAXIL) tablet  30 mg  30 mg Oral Daily [name], MD      . phytonadione (VITAMIN K) 5 mg in dextrose 5 % 50 mL IVPB  5 mg Intravenous Once Hessie Knows, MD 50 mL/hr at 03/26/17 0945 5 mg at 03/26/17 0945  . pravastatin (PRAVACHOL) tablet 40 mg  40 mg Oral QPM [name], MD      . traMADol Veatrice Bourbon) tablet 50 mg  50 mg Oral Q6H PRN [name], MD   50 mg at 03/26/17 7858     Discharge Medications: Please see discharge summary for a list of discharge medications.  Relevant Imaging Results:  Relevant Lab Results:   Additional Information  (SSN: [id number])  [employer], Veronia Beets, LCSW

## 2017-03-26 NOTE — Progress Notes (Signed)
[place] for Warfarin Indication: h/o TIA, questionable atrial fibrillation   Allergies  Allergen Reactions  . Cefuroxime Axetil     caused rash.  . Ciprofloxacin     Unknown  . Codeine     Patient Measurements: Height: 5\' 7"  (170.2 cm) Weight: 131 lb (59.4 kg) IBW/kg (Calculated) : 61.6   Vital Signs: Temp: 97.4 F (36.3 C) (10/22 1718) Temp Source: Oral (10/22 0734) BP: 141/82 (10/22 1803) Pulse Rate: 71 (10/22 1803)  Labs:  Recent Labs  03/25/17 1652 03/26/17 0350 03/26/17 1223 03/26/17 1442  HGB 13.9 12.4  --   --   HCT 40.1 36.1  --   --   PLT 211 176  --   --   APTT 34  --   --   --   LABPROT 21.9* 22.8* 19.6* 18.0*  INR 1.93 2.03 1.67 1.50  CREATININE 1.61* 1.54*  --   --     Estimated Creatinine Clearance: 28.7 mL/min (A) (by C-G formula based on SCr of 1.54 mg/dL (H)).   Medical History: Past Medical History:  Diagnosis Date  . A-fib ([place])   . Allergic rhinitis   . Anxiety   . Chronic bronchitis ([place])   . CKD (chronic kidney disease), stage III ([place])   . Cornea scar   . Diverticulosis   . Hyperlipidemia   . Hypertension   . Osteoporosis   . Risk for falls   . Situational disturbance   . TIA (transient ischemic attack)     Assessment: Patient is s/p hip fracture repair to resume warfarin and is s/p multiple doses of vitamin K. Patient is also on Lovenox.   Goal of Therapy:  INR 2-3   Plan:  Will resume warfarin 2 mg daily and f/u AM INR.   Ulice Dash D 03/26/2017,6:39 PM

## 2017-03-26 NOTE — Anesthesia Procedure Notes (Incomplete)
Procedures

## 2017-03-26 NOTE — Progress Notes (Signed)
Pt with hip fracture, high INR, will give vit K and recheck protime. Possible ORIF later today.

## 2017-03-26 NOTE — Progress Notes (Addendum)
[place] [facility] [place] NAME: Christine Burnett    MR#:  [id number]  DATE OF BIRTH:  1941/05/30  SUBJECTIVE:  CHIEF COMPLAINT:  Pt is reporting hip pain, daughter at bedside  REVIEW OF SYSTEMS:  CONSTITUTIONAL: No fever, fatigue or weakness.  EYES: No blurred or double vision.  EARS, NOSE, AND THROAT: No tinnitus or ear pain.  RESPIRATORY: No cough, shortness of breath, wheezing or hemoptysis.  CARDIOVASCULAR: No chest pain, orthopnea, edema.  GASTROINTESTINAL: No nausea, vomiting, diarrhea or abdominal pain.  GENITOURINARY: No dysuria, hematuria.  ENDOCRINE: No polyuria, nocturia,  HEMATOLOGY: No anemia, easy bruising or bleeding SKIN: No rash or lesion. MUSCULOSKELETAL: Reporting hip pain on the left side NEUROLOGIC: No tingling, numbness, weakness.  PSYCHIATRY: No anxiety or depression.   DRUG ALLERGIES:   Allergies  Allergen Reactions  . Cefuroxime Axetil     caused rash.  . Ciprofloxacin     Unknown  . Codeine     VITALS:  Blood pressure (!) 147/70, pulse 69, temperature 98.5 F (36.9 C), temperature source Oral, resp. rate 20, height 5\' 7"  (1.702 m), weight 59.4 kg (131 lb), SpO2 95 %.  PHYSICAL EXAMINATION:  GENERAL:  76 y.o.-year-old patient lying in the bed with no acute distress.  EYES: Pupils equal, round, reactive to light and accommodation. No scleral icterus. Extraocular muscles intact.  HEENT: Head atraumatic, normocephalic. Oropharynx and nasopharynx clear.  NECK:  Supple, no jugular venous distention. No thyroid enlargement, no tenderness.  LUNGS: Normal breath sounds bilaterally, no wheezing, rales,rhonchi or crepitation. No use of accessory muscles of respiration.  CARDIOVASCULAR: S1, S2 normal. No murmurs, rubs, or gallops.  ABDOMEN: Soft, nontender, nondistended. Bowel sounds present. No organomegaly or mass.  EXTREMITIES: Left hip is a tender and externally rotated No pedal edema, cyanosis, or  clubbing.  NEUROLOGIC: Cranial nerves II through XII are intact. Muscle strength at her baseline except left lower extremity. Sensation intact. Gait not checked.  PSYCHIATRIC: The patient is alert and oriented x 3.  SKIN: No obvious rash, lesion, or ulcer.    LABORATORY PANEL:   CBC  Recent Labs Lab 03/26/17 0350  WBC 9.0  HGB 12.4  HCT 36.1  PLT 176   ------------------------------------------------------------------------------------------------------------------  Chemistries   Recent Labs Lab 03/26/17 0350  NA 139  K 4.8  CL 106  CO2 27  GLUCOSE 132*  BUN 17  CREATININE 1.54*  CALCIUM 9.2   ------------------------------------------------------------------------------------------------------------------  Cardiac Enzymes No results for input(s): TROPONINI in the last 168 hours. ------------------------------------------------------------------------------------------------------------------  RADIOLOGY:  Dg Chest 1 View  Result Date: 03/25/2017 CLINICAL DATA:  Preop left hip fracture. EXAM: CHEST 1 VIEW COMPARISON:  07/02/2014 FINDINGS: The heart size and mediastinal contours are within normal limits. Aortic atherosclerosis at the arch. Lungs are hyperinflated and there is mild diffuse interstitial prominence which may reflect age related interstitial change or mild fibrosis. Right basilar subpleural atelectasis and/or scarring. No pneumonic consolidation, effusion or pneumothorax. The visualized skeletal structures are unremarkable. IMPRESSION: Mild hyperinflation of the lungs. Mild diffuse interstitial prominence may reflect chronic interstitial lung disease or fibrosis. No pneumonia. Electronically Signed   By: Ashley Royalty M.D.   On: 03/25/2017 17:44   Ct Head Wo Contrast  Result Date: 03/25/2017 CLINICAL DATA:  Initial evaluation for acute trauma, fall. EXAM: CT HEAD WITHOUT CONTRAST CT CERVICAL SPINE WITHOUT CONTRAST TECHNIQUE: Multidetector CT imaging of the  head and cervical spine was performed following the standard protocol without intravenous contrast. Multiplanar CT  image reconstructions of the cervical spine were also generated. COMPARISON:  Prior CT from 07/03/2014. FINDINGS: CT HEAD FINDINGS Brain: Generalized cerebral atrophy with moderate chronic small vessel ischemic disease. No acute intracranial hemorrhage. No evidence for acute large vessel territory infarct. No mass lesion, midline shift or mass effect. No hydrocephalus. No extra-axial fluid collection. Vascular: No hyperdense vessel. Scattered vascular calcifications noted within the carotid siphons. Skull: Scalp soft tissues and calvarium within normal limits. Sinuses/Orbits: Globes and orbital soft tissues normal. Scattered mucosal thickening within the ethmoidal air cells. Paranasal sinuses otherwise clear. No mastoid effusion. Other: None. CT CERVICAL SPINE FINDINGS Alignment: Straightening of the normal cervical lordosis. Grade 1 anterolisthesis of C3 on C4 and C4 on C5. Skull base and vertebrae: Skullbase intact. Normal C1-2 articulations are preserved in the dens is intact. Vertebral body heights maintained. No acute fracture. Soft tissues and spinal canal: Soft tissues of the neck demonstrate no acute abnormality. No prevertebral edema. Vascular calcifications about the carotid bifurcations. Disc levels: Moderate degenerative spondylolysis present at C5-6 and C6-7. Multilevel facet arthrosis noted, greatest within the upper cervical spine. Left C2-3 facets are fused. Upper chest: Visualized upper chest demonstrates no acute abnormality. Paraseptal emphysema noted at the lung apices. Other: None. IMPRESSION: CT BRAIN: 1. No acute intracranial process. 2. Generalized cerebral atrophy with moderate chronic small vessel ischemic disease. CT CERVICAL SPINE: 1. No acute traumatic injury within cervical spine. 2. Straightening of the normal cervical lordosis, which may be related to positioning and/or  muscular spasm. 3. Grade 1 anterolisthesis C3 on C4 and C4 on C5, likely due to chronic facet degeneration. Moderate degenerative spondylolysis at C5-6 and C6-7. Electronically Signed   By: Jeannine Boga M.D.   On: 03/25/2017 18:38   Ct Cervical Spine Wo Contrast  Result Date: 03/25/2017 CLINICAL DATA:  Initial evaluation for acute trauma, fall. EXAM: CT HEAD WITHOUT CONTRAST CT CERVICAL SPINE WITHOUT CONTRAST TECHNIQUE: Multidetector CT imaging of the head and cervical spine was performed following the standard protocol without intravenous contrast. Multiplanar CT image reconstructions of the cervical spine were also generated. COMPARISON:  Prior CT from 07/03/2014. FINDINGS: CT HEAD FINDINGS Brain: Generalized cerebral atrophy with moderate chronic small vessel ischemic disease. No acute intracranial hemorrhage. No evidence for acute large vessel territory infarct. No mass lesion, midline shift or mass effect. No hydrocephalus. No extra-axial fluid collection. Vascular: No hyperdense vessel. Scattered vascular calcifications noted within the carotid siphons. Skull: Scalp soft tissues and calvarium within normal limits. Sinuses/Orbits: Globes and orbital soft tissues normal. Scattered mucosal thickening within the ethmoidal air cells. Paranasal sinuses otherwise clear. No mastoid effusion. Other: None. CT CERVICAL SPINE FINDINGS Alignment: Straightening of the normal cervical lordosis. Grade 1 anterolisthesis of C3 on C4 and C4 on C5. Skull base and vertebrae: Skullbase intact. Normal C1-2 articulations are preserved in the dens is intact. Vertebral body heights maintained. No acute fracture. Soft tissues and spinal canal: Soft tissues of the neck demonstrate no acute abnormality. No prevertebral edema. Vascular calcifications about the carotid bifurcations. Disc levels: Moderate degenerative spondylolysis present at C5-6 and C6-7. Multilevel facet arthrosis noted, greatest within the upper cervical  spine. Left C2-3 facets are fused. Upper chest: Visualized upper chest demonstrates no acute abnormality. Paraseptal emphysema noted at the lung apices. Other: None. IMPRESSION: CT BRAIN: 1. No acute intracranial process. 2. Generalized cerebral atrophy with moderate chronic small vessel ischemic disease. CT CERVICAL SPINE: 1. No acute traumatic injury within cervical spine. 2. Straightening of the normal cervical lordosis, which may  be related to positioning and/or muscular spasm. 3. Grade 1 anterolisthesis C3 on C4 and C4 on C5, likely due to chronic facet degeneration. Moderate degenerative spondylolysis at C5-6 and C6-7. Electronically Signed   By: Jeannine Boga M.D.   On: 03/25/2017 18:38   Dg Hip Unilat With Pelvis 2-3 Views Left  Result Date: 03/25/2017 CLINICAL DATA:  Pain after fall today. EXAM: DG HIP (WITH OR WITHOUT PELVIS) 2-3V LEFT COMPARISON:  None. FINDINGS: An acute, closed, varus angulated intertrochanteric fracture of the left femur is identified. No joint dislocations. The no acute displaced appearing pelvic fracture. 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 Degenerative spurring is noted off the acetabular roofs and both femoral heads. IMPRESSION: An acute, closed, varus angulated intertrochanteric fracture of the left femur is identified. Electronically Signed   By: Ashley Royalty M.D.   On: 03/25/2017 17:38    EKG:   Orders placed or performed during the hospital encounter of 03/25/17  . ED EKG  . ED EKG  . EKG 12-Lead  . EKG 12-Lead    ASSESSMENT AND PLAN:     * left Hip fracture   Ortho consulted, patient was seen by Dr. Rudene Christians For open reduction and internal fixation later today when INR is less than 1.5 Pain management as needed Pending cardio assessment as pt have Htn, HLD, family hx of CAD and she is a smoker. D/w Cardio [facility], patient is at moderate to high risk for noncardiac surgery, echo is pending, okay with hip surgery  *  coagulopathy-secondary to Coumadin    INR 1.9--1.5 after oral vitamin K     holding Coumadin for now  * carotid artery stenosis    Pt claims had history of TIA and taking Coumadin regarding the same, doesn't think she has atrial fibrillation Patient [facility] in [facility], questionable atrial fibrillation, diagnosis not confirmed, patient refused a 30 day event monitor which was offered by Dr. Cathie Olden at that time       Hold coumadin for now.  * Htn   Stable     Cont home meds  * Hyperlipidemia    Cont home meds.  * Active smoking   [place] to quit for 4 min.         All the records are reviewed and case discussed with Care Management/Social Workerr. Management plans discussed with the patient, daughter at bedside and they are in agreement.  CODE STATUS: Full code  TOTAL TIME TAKING CARE OF THIS PATIENT: 35 minutes.   POSSIBLE D/C IN 2-3 DAYS, DEPENDING ON CLINICAL CONDITION.  Note: This dictation was prepared with Dragon dictation along with smaller phrase technology. Any transcriptional errors that result from this process are unintentional.   Nicholes Mango M.D on 03/26/2017 at 3:26 PM  Between 7am to 6pm - Pager - [phone] After 6pm go to [email or web address] - password [email or web address] [facility]  [place] [facility]  Office  [phone]  CC: Primary care physician; Roselee Nova, MD

## 2017-03-26 NOTE — Progress Notes (Signed)
Anticoagulation monitoring(Lovenox):  76yo  female ordered Lovenox 40 mg Q24h  Filed Weights   03/25/17 1621  Weight: 131 lb (59.4 kg)   BMI 20.6   Lab Results  Component Value Date   CREATININE 1.54 (H) 03/26/2017   CREATININE 1.61 (H) 03/25/2017   CREATININE 1.48 (H) 03/13/2017   Estimated Creatinine Clearance: 28.7 mL/min (A) (by C-G formula based on SCr of 1.54 mg/dL (H)). Hemoglobin & Hematocrit     Component Value Date/Time   HGB 12.4 03/26/2017 0350   HGB 13.7 01/15/2015 1500   HCT 36.1 03/26/2017 0350   HCT 38.9 01/15/2015 1500     Per Protocol for Patient with estCrcl < 30 ml/min and BMI < 40, will transition to Lovenox 30 mg Q24h.

## 2017-03-26 NOTE — Transfer of Care (Signed)
Immediate Anesthesia Transfer of Care Note  Patient: Christine Burnett  Procedure(s) Performed: INTRAMEDULLARY (IM) NAIL INTERTROCHANTRIC (Left )  Patient Location: PACU  Anesthesia Type:General  Level of Consciousness: drowsy and patient cooperative  Airway & Oxygen Therapy: Patient Spontanous Breathing and Patient connected to face mask oxygen  Post-op Assessment: Report given to RN and Post -op Vital signs reviewed and stable  Post vital signs: Reviewed and stable  Last Vitals:  Vitals:   03/26/17 0734 03/26/17 1718  BP: (!) 147/70 91/69  Pulse: 69 73  Resp:  17  Temp: 36.9 C (!) 36.3 C  SpO2: 95% 100%    Last Pain:  Vitals:   03/26/17 1420  TempSrc:   PainSc: 8          Complications: No apparent anesthesia complications

## 2017-03-26 NOTE — Clinical Social Work Placement (Signed)
   CLINICAL SOCIAL WORK PLACEMENT  NOTE  Date:  03/26/2017  Patient Details  Name: Christine Burnett MRN: [id number] Date of Birth: Dec 15, 1940  Clinical Social Work is seeking post-discharge placement for this patient at the [place] level of care (*CSW will initial, date and re-position this form in  chart as items are completed):  Yes   Patient/family provided with [place] Work Department's list of facilities offering this level of care within the geographic area requested by the patient (or if unable, by the patient's family).  Yes   Patient/family informed of their freedom to choose among providers that offer the needed level of care, that participate in Medicare, Medicaid or managed care program needed by the patient, have an available bed and are willing to accept the patient.  Yes   Patient/family informed of [facility]'s ownership interest in [facility] and [facility], as well as of the fact that they are under no obligation to receive care at these facilities.  PASRR submitted to EDS on 03/26/17     PASRR number received on 03/26/17     Existing PASRR number confirmed on       FL2 transmitted to all facilities in geographic area requested by pt/family on 03/26/17     FL2 transmitted to all facilities within larger geographic area on       Patient informed that his/her managed care company has contracts with or will negotiate with certain facilities, including the following:            Patient/family informed of bed offers received.  Patient chooses bed at       Physician recommends and patient chooses bed at      Patient to be transferred to   on  .  Patient to be transferred to facility by       Patient family notified on   of transfer.  Name of family member notified:        PHYSICIAN       Additional Comment:    _______________________________________________ Alexianna Nachreiner, Veronia Beets, LCSW 03/26/2017, 2:33 PM

## 2017-03-27 ENCOUNTER — Encounter: Payer: Self-pay | Admitting: Orthopedic Surgery

## 2017-03-27 ENCOUNTER — Inpatient Hospital Stay (HOSPITAL_COMMUNITY)
Admit: 2017-03-27 | Discharge: 2017-03-27 | Disposition: A | Discharge status: Home / Self Care | Payer: Medicare Other | Attending: Orthopedic Surgery | Admitting: Orthopedic Surgery

## 2017-03-27 DIAGNOSIS — I5030 Unspecified diastolic (congestive) heart failure: Secondary | ICD-10-CM

## 2017-03-27 LAB — BASIC METABOLIC PANEL
Anion gap: 5 (ref 5–15)
BUN: 17 mg/dL (ref 6–20)
CALCIUM: 8.9 mg/dL (ref 8.9–10.3)
CO2: 26 mmol/L (ref 22–32)
CREATININE: 1.26 mg/dL — AB (ref 0.44–1.00)
Chloride: 106 mmol/L (ref 101–111)
GFR calc Af Amer: 46 mL/min — ABNORMAL LOW (ref >60)
GFR calc non Af Amer: 40 mL/min — ABNORMAL LOW (ref >60)
GLUCOSE: 163 mg/dL — AB (ref 65–99)
Potassium: 4.6 mmol/L (ref 3.5–5.1)
Sodium: 137 mmol/L (ref 135–145)

## 2017-03-27 LAB — CBC
HEMATOCRIT: 35.2 % (ref 35.0–47.0)
Hemoglobin: 12.2 g/dL (ref 12.0–16.0)
MCH: 34.8 pg — ABNORMAL HIGH (ref 26.0–34.0)
MCHC: 34.6 g/dL (ref 32.0–36.0)
MCV: 100.5 fL — AB (ref 80.0–100.0)
Platelets: 143 10*3/uL — ABNORMAL LOW (ref 150–440)
RBC: 3.5 MIL/uL — ABNORMAL LOW (ref 3.80–5.20)
RDW: 12.8 % (ref 11.5–14.5)
WBC: 7.8 10*3/uL (ref 3.6–11.0)

## 2017-03-27 LAB — ECHOCARDIOGRAM COMPLETE
Height: 67 in
Weight: 2096 oz

## 2017-03-27 LAB — PROTIME-INR
INR: 1.17
PROTHROMBIN TIME: 14.8 s (ref 11.4–15.2)

## 2017-03-27 MED ORDER — SODIUM CHLORIDE 0.9 % IV SOLN
INTRAVENOUS | Status: AC
  Administered 2017-03-27: 15:00:00 via INTRAVENOUS

## 2017-03-27 MED ORDER — WARFARIN SODIUM 2 MG PO TABS
2.0000 mg | ORAL_TABLET | ORAL | Status: AC
  Administered 2017-03-27: 2 mg via ORAL
  Filled 2017-03-27: qty 1

## 2017-03-27 MED ORDER — ENOXAPARIN SODIUM 40 MG/0.4ML ~~LOC~~ SOLN
40.0000 mg | SUBCUTANEOUS | Status: DC
  Administered 2017-03-28 – 2017-03-29 (×2): 40 mg via SUBCUTANEOUS
  Filled 2017-03-27 (×2): qty 0.4

## 2017-03-27 MED ORDER — POLYETHYLENE GLYCOL 3350 17 G PO PACK
17.0000 g | PACK | Freq: Every day | ORAL | Status: DC
  Filled 2017-03-27: qty 1

## 2017-03-27 NOTE — Progress Notes (Signed)
Physical Therapy Evaluation Patient Details Name: MIKAEL DEBELL MRN: [id number] DOB: 1941-06-02 Today's Date: 03/27/2017   History of Present Illness  s/p L hip IM nailing on 10/22, due to fall and L intertrochanteric fracture. PMH of CKD, TIA, HTN, HLD, family history of CAD  Clinical Impression  Pt is a pleasant 76 year old female s/p L hip IM nailing. Pt educated on WBAT status, performed supine there-ex, bed mobility/transfers/amb with RW with min assist. Pt reported feeling dizzy throughout session, BP 97/59 when seated at EOB for several minutes. Pt amb 2 ft to recliner, able to WB through LLE, however did not appear safe to amb further due to reports of dizziness and unsteadiness on feet. Plan to amb further this afternoon. Pt's daughter was present throughout the session. Pt demonstrates deficits with strength/endurance/bed mobility/transfers/amb. Would benefit from further skilled PT services to promote optimal return to home. Recommend transition to [facility] upon DC from acute hospitalization.      Follow Up Recommendations Home health PT    Equipment Recommendations  Rolling walker with 5" wheels    Recommendations for Other Services       Precautions / Restrictions Precautions Precautions: Fall Restrictions Weight Bearing Restrictions: Yes Other Position/Activity Restrictions: L hip WBAT      Mobility  Bed Mobility Overal bed mobility: Needs Assistance Bed Mobility: Supine to Sit     Supine to sit: Min assist     General bed mobility comments: Cues for proper technique, min assist to guide LLE off bed, pt utilized bed rails, able to scoot to EOB. Pt reported feeling dizzy, measured BP 97/59  Transfers Overall transfer level: Needs assistance Equipment used: Rolling walker (2 wheeled) Transfers: Sit to/from Stand Sit to Stand: Min assist         General transfer comment: Cues for proper hand and leg placement, min assist to rise to standing, height of bed  raised  Ambulation/Gait Ambulation/Gait assistance: Min assist Ambulation Distance (Feet): 2 Feet Assistive device: Rolling walker (2 wheeled) Gait Pattern/deviations: Step-to pattern     General Gait Details: Pt able to WB through LLE however slightly unsteady on feet, continuous cues for proper sequencing of RW and feet, takes very short steps, amb slowly.   Stairs            Wheelchair Mobility    Modified Rankin (Stroke Patients Only)       Balance Overall balance assessment: Needs assistance Sitting-balance support: Feet supported Sitting balance-Leahy Scale: Good Sitting balance - Comments: Pt able to sit at EOB and maintain position with no assistance. Hands on EOB for support   Standing balance support: Bilateral upper extremity supported Standing balance-Leahy Scale: Fair Standing balance comment: Reliance on RW for support, able to WB through LLE, pt reported feeling dizzy                              Pertinent Vitals/Pain Pain Assessment: 0-10 Pain Score: 1  Pain Location: L hip Pain Descriptors / Indicators: Sore Pain Intervention(s): Limited activity within patient's tolerance;Monitored during session;Repositioned;RN gave pain meds during session    Home Living Family/patient expects to be discharged to:: Private residence Living Arrangements: Children Available Help at Discharge: Family Type of Home: House Home Access: Stairs to enter Entrance Stairs-Rails: Left Entrance Stairs-Number of Steps: 2 steps to porch and 1 step into home Home Layout: One level Home Equipment: None      Prior Function Level  of Independence: Independent         Comments: Pt is able to perform all ADLs independently      Hand Dominance        Extremity/Trunk Assessment   Upper Extremity Assessment Upper Extremity Assessment: Generalized weakness (UE MMT grossly 4-/5)    Lower Extremity Assessment Lower Extremity Assessment: Generalized  weakness (RLE MMT grossly 4-/5)       Communication   Communication: No difficulties  Cognition Arousal/Alertness: Awake/alert Behavior During Therapy: WFL for tasks assessed/performed Overall Cognitive Status: Within Functional Limits for tasks assessed                                        General Comments      Exercises Other Exercises Other Exercises: Supine ther-ex 10x, ankle pumps, SLRs, hip abdd/add. Cues for proper technique, CGA to help initiate exercises on LLE.    Assessment/Plan    PT Assessment Patient needs continued PT services  PT Problem List Decreased strength;Decreased range of motion;Decreased activity tolerance;Decreased balance;Decreased mobility;Decreased knowledge of use of DME       PT Treatment Interventions DME instruction;Gait training;Stair training;Functional mobility training;Therapeutic activities;Therapeutic exercise;Balance training;Patient/family education    PT Goals (Current goals can be found in the Care Plan section)  Acute Rehab PT Goals Patient Stated Goal: to return home PT Goal Formulation: With patient Time For Goal Achievement: 04/10/17 Potential to Achieve Goals: Good    Frequency BID   Barriers to discharge        Co-evaluation               AM-PAC PT "6 Clicks" Daily Activity  Outcome Measure Difficulty turning over in bed (including adjusting bedclothes, sheets and blankets)?: None Difficulty moving from lying on back to sitting on the side of the bed? : Unable Difficulty sitting down on and standing up from a chair with arms (e.g., wheelchair, bedside commode, etc,.)?: Unable Help needed moving to and from a bed to chair (including a wheelchair)?: A Lot Help needed walking in hospital room?: A Lot Help needed climbing 3-5 steps with a railing? : A Lot 6 Click Score: 12    End of Session Equipment Utilized During Treatment: Gait belt Activity Tolerance: Patient tolerated treatment well;Other  (comment) (Limited by dizziness and low BP) Patient left: in chair;with call bell/phone within reach;with chair alarm set;with family/visitor present Nurse Communication: Mobility status PT Visit Diagnosis: Other abnormalities of gait and mobility (R26.89);Muscle weakness (generalized) (M62.81);Unsteadiness on feet (R26.81)    Time: 2130-8657 PT Time Calculation (min) (ACUTE ONLY): 39 min   Charges:         PT G Codes:   PT G-Codes **NOT FOR INPATIENT CLASS** Functional Assessment Tool Used: AM-PAC 6 Clicks Basic Mobility Functional Limitation: Mobility: Walking and moving around Mobility: Walking and Moving Around Current Status (Q4696): At least 60 percent but less than 80 percent impaired, limited or restricted Mobility: Walking and Moving Around Goal Status [id number]): At least 40 percent but less than 60 percent impaired, limited or restricted    Manfred Arch, SPT  Manfred Arch 03/27/2017, 11:44 AM

## 2017-03-27 NOTE — Progress Notes (Signed)
[place] for Warfarin Indication: h/o TIA, questionable atrial fibrillation   Allergies  Allergen Reactions  . Cefuroxime Axetil     caused rash.  . Ciprofloxacin     Unknown  . Codeine     Patient Measurements: Height: 5\' 7"  (170.2 cm) Weight: 131 lb (59.4 kg) IBW/kg (Calculated) : 61.6   Vital Signs: Temp: 98.3 F (36.8 C) (10/23 0726) Temp Source: Oral (10/23 0726) BP: 108/60 (10/23 0726) Pulse Rate: 76 (10/23 0726)  Labs:  Recent Labs  03/25/17 1652 03/26/17 0350 03/26/17 1223 03/26/17 1442 03/27/17 0448  HGB 13.9 12.4  --   --  12.2  HCT 40.1 36.1  --   --  35.2  PLT 211 176  --   --  143*  APTT 34  --   --   --   --   LABPROT 21.9* 22.8* 19.6* 18.0* 14.8  INR 1.93 2.03 1.67 1.50 1.17  CREATININE 1.61* 1.54*  --   --  1.26*    Estimated Creatinine Clearance: 35.1 mL/min (A) (by C-G formula based on SCr of 1.26 mg/dL (H)).   Medical History: Past Medical History:  Diagnosis Date  . A-fib ([place])   . Allergic rhinitis   . Anxiety   . Chronic bronchitis ([place])   . CKD (chronic kidney disease), stage III ([place])   . Cornea scar   . Diverticulosis   . Hyperlipidemia   . Hypertension   . Osteoporosis   . Risk for falls   . Situational disturbance   . TIA (transient ischemic attack)     Assessment: Patient is s/p hip fracture repair to resume warfarin and is s/p multiple doses of vitamin K. Patient is also on Lovenox.   Goal of Therapy:  INR 2-3   Plan:  Due to receiving multiple doses of Vitamin K. Will give an additional 2 mg today for a total of 4mg . Will resume warfarin 2 mg daily tomorrow and f/u AM INR.   Pernell Dupre, PharmD, BCPS Clinical Pharmacist 03/27/2017 8:28 AM

## 2017-03-27 NOTE — Progress Notes (Signed)
Physical Therapy Treatment Patient Details Name: Christine Burnett MRN: [id number] DOB: Feb 11, 1941 Today's Date: 03/27/2017    History of Present Illness 76yo pt s/p L hip IM nailing on 10/22, due to fall and L intertrochanteric fracture. PMH of CKD, TIA, HTN, HLD, family history of CAD.    PT Comments    Pt agreeable to PT; reports 7/10 pain with movement in left hip/LE. Pt stiff from being in the chair a good portion of the day. Pt/family educated in seated exercise, as well as supine to allow for movement and avoid stiffness and progress strength. Pt requires Min A for transfers and bed mobility and Min guard to Min A for ambulation; assist for proper step lengths, sequence and turn sequence. Pt with inconsistent sequence and steps, but did progress distance to 40 feet. Continue PT to progress strength, endurance and quality of ambulation to improve functional mobility.    Follow Up Recommendations  Home health PT     Equipment Recommendations  Rolling walker with 5" wheels    Recommendations for Other Services       Precautions / Restrictions Precautions Precautions: Fall Restrictions Weight Bearing Restrictions: Yes LLE Weight Bearing: Weight bearing as tolerated    Mobility  Bed Mobility Overal bed mobility: Needs Assistance Bed Mobility: Sit to Supine;Rolling Rolling: Min assist     Sit to supine: Min assist   General bed mobility comments: for LLE  Transfers Overall transfer level: Needs assistance Equipment used: Rolling walker (2 wheeled) Transfers: Sit to/from Stand Sit to Stand: Min assist         General transfer comment: cues for hand placement; slow to rise/sit. Cues for gentle weigtht shift once in stand to allow for flat foot on L  Ambulation/Gait Ambulation/Gait assistance: Min guard;Min assist Ambulation Distance (Feet): 40 Feet Assistive device: Rolling walker (2 wheeled) Gait Pattern/deviations: Step-to pattern;Step-through  pattern;Decreased stride length;Decreased stance time - left;Narrow base of support (partial step through) Gait velocity: slow Gait velocity interpretation: <1.8 ft/sec, indicative of risk for recurrent falls General Gait Details: Pt with inconsistent steps/step lengths B with increased cueing   Stairs            Wheelchair Mobility    Modified Rankin (Stroke Patients Only)       Balance Overall balance assessment: Needs assistance Sitting-balance support: Feet supported Sitting balance-Leahy Scale: Good     Standing balance support: Bilateral upper extremity supported Standing balance-Leahy Scale: Fair                              Cognition Arousal/Alertness: Awake/alert Behavior During Therapy: WFL for tasks assessed/performed;Anxious Overall Cognitive Status: Within Functional Limits for tasks assessed                                        Exercises General Exercises - Lower Extremity Ankle Circles/Pumps: AROM;Both;20 reps;Supine Quad Sets: Strengthening;Both;20 reps;Supine Gluteal Sets: Strengthening;Both;20 reps;Supine Long Arc Quad: AROM;Both;10 reps;Seated Heel Slides: AROM;Both;10 reps Hip ABduction/ADduction: AAROM;Both;10 reps;Supine Straight Leg Raises: AAROM;Both;10 reps;Supine Hip Flexion/Marching: AROM;Strengthening;Both;Seated;10 reps Toe Raises: AROM;Both;20 reps;Seated Other Exercises Other Exercises: Family educated in seated and supine exercises    General Comments        Pertinent Vitals/Pain Pain Assessment: 0-10 Pain Score: 7  Pain Location: L hip with movement Pain Descriptors / Indicators: Aching;Grimacing;Guarding Pain Intervention(s): Monitored during session;Repositioned  Home Living Family/patient expects to be discharged to:: Private residence Living Arrangements: Children Available Help at Discharge: Family;Available 24 hours/day Type of Home: House Home Access: Stairs to enter Entrance  Stairs-Rails: Left Home Layout: One level Home Equipment: Shower seat - built in      Prior Function Level of Independence: Independent      Comments: Pt is able to perform all ADLs independently, no additional falls in past 12 mo per pt report other than fall that led to this hospitalization   PT Goals (current goals can now be found in the care plan section) Acute Rehab PT Goals Patient Stated Goal: to return home Progress towards PT goals: Progressing toward goals    Frequency    BID      PT Plan Current plan remains appropriate    Co-evaluation              AM-PAC PT "6 Clicks" Daily Activity  Outcome Measure  Difficulty turning over in bed (including adjusting bedclothes, sheets and blankets)?: Unable Difficulty moving from lying on back to sitting on the side of the bed? : Unable Difficulty sitting down on and standing up from a chair with arms (e.g., wheelchair, bedside commode, etc,.)?: Unable Help needed moving to and from a bed to chair (including a wheelchair)?: A Little Help needed walking in hospital room?: A Little Help needed climbing 3-5 steps with a railing? : A Lot 6 Click Score: 11    End of Session Equipment Utilized During Treatment: Gait belt Activity Tolerance: Patient tolerated treatment well;Patient limited by fatigue;Patient limited by pain Patient left: in bed;with call bell/phone within reach;with bed alarm set;with family/visitor present;with SCD's reapplied   PT Visit Diagnosis: Other abnormalities of gait and mobility (R26.89);Muscle weakness (generalized) (M62.81);Unsteadiness on feet (R26.81)     Time: 9597-4718 PT Time Calculation (min) (ACUTE ONLY): 35 min  Charges:  $Gait Training: 8-22 mins $Therapeutic Exercise: 8-22 mins                    G Codes:        Larae Grooms, PTA 03/27/2017, 4:01 PM

## 2017-03-27 NOTE — Progress Notes (Signed)
PT is recommending home health. RN case manager aware of above. Please reconsult if future social work needs arise. CSW signing off.   [employer], LCSW [phone]

## 2017-03-27 NOTE — Progress Notes (Signed)
   Subjective: 1 Day Post-Op Procedure(s) (LRB): INTRAMEDULLARY (IM) NAIL INTERTROCHANTRIC (Left) Patient reports pain as mild.  4/10 with standing Patient is well, but has had some minor complaints of dizziness increased with standing. Hx of similar symptoms prior to admission. Denies any CP, SOB, ABD pain. We will continue therapy today.  .  Objective: Vital signs in last 24 hours: Temp:  [97.4 F (36.3 C)-98.9 F (37.2 C)] 98.3 F (36.8 C) (10/23 0726) Pulse Rate:  [65-82] 76 (10/23 0726) Resp:  [16-20] 18 (10/23 0400) BP: (91-156)/(60-84) 108/60 (10/23 0726) SpO2:  [90 %-100 %] 91 % (10/23 0726)  Intake/Output from previous day: 10/22 0701 - 10/23 0700 In: 948.8 [I.V.:898.8; IV Piggyback:50] Out: 1700 [Urine:1700] Intake/Output this shift: No intake/output data recorded.   Recent Labs  03/25/17 1652 03/26/17 0350 03/27/17 0448  HGB 13.9 12.4 12.2    Recent Labs  03/26/17 0350 03/27/17 0448  WBC 9.0 7.8  RBC 3.57* 3.50*  HCT 36.1 35.2  PLT 176 143*    Recent Labs  03/26/17 0350 03/27/17 0448  NA 139 137  K 4.8 4.6  CL 106 106  CO2 27 26  BUN 17 17  CREATININE 1.54* 1.26*  GLUCOSE 132* 163*  CALCIUM 9.2 8.9    Recent Labs  03/26/17 1442 03/27/17 0448  INR 1.50 1.17    EXAM General - Patient is Alert, Appropriate and Oriented Extremity - Neurovascular intact Sensation intact distally Intact pulses distally Dorsiflexion/Plantar flexion intact No cellulitis present Compartment soft Dressing - dressing C/D/I and no drainage Motor Function - intact, moving foot and toes well on exam.   Past Medical History:  Diagnosis Date  . A-fib ([place])   . Allergic rhinitis   . Anxiety   . Chronic bronchitis ([place])   . CKD (chronic kidney disease), stage III ([place])   . Cornea scar   . Diverticulosis   . Hyperlipidemia   . Hypertension   . Osteoporosis   . Risk for falls   . Situational disturbance   . TIA (transient ischemic attack)      Assessment/Plan:   1 Day Post-Op Procedure(s) (LRB): INTRAMEDULLARY (IM) NAIL INTERTROCHANTRIC (Left) Principal Problem:   Hip fracture, unspecified laterality, closed, initial encounter ([place]) Active Problems:   Hip fracture ([place])  Estimated body mass index is 20.52 kg/m as calculated from the following:   Height as of this encounter: 5\' 7"  (1.702 m).   Weight as of this encounter: 59.4 kg (131 lb). Advance diet Up with therapy  INR subtherapeutic. Continue with lovenox and coumadin Needs BM CM to assist with discharge Follow up with [facility] in 6 weeks for recheck Staple removal and steristrip application 86/38/17  DVT Prophylaxis - Lovenox and Coumadin Weight-Bearing as tolerated to left leg   T. Rachelle Hora, PA-C [place] 03/27/2017, 9:30 AM

## 2017-03-27 NOTE — Progress Notes (Signed)
[facility] NAME: Christine Burnett    MR#:  [id number]  DATE OF BIRTH:  September 25, 1940  SUBJECTIVE:  CHIEF COMPLAINT:  Pt had surgery yesterday, OOB to chair , no BM yet  REVIEW OF SYSTEMS:  CONSTITUTIONAL: No fever, fatigue or weakness.  EYES: No blurred or double vision.  EARS, NOSE, AND THROAT: No tinnitus or ear pain.  RESPIRATORY: No cough, shortness of breath, wheezing or hemoptysis.  CARDIOVASCULAR: No chest pain, orthopnea, edema.  GASTROINTESTINAL: No nausea, vomiting, diarrhea or abdominal pain.  GENITOURINARY: No dysuria, hematuria.  ENDOCRINE: No polyuria, nocturia,  HEMATOLOGY: No anemia, easy bruising or bleeding SKIN: No rash or lesion. MUSCULOSKELETAL: Reporting hip pain on the left side NEUROLOGIC: No tingling, numbness, weakness.  PSYCHIATRY: No anxiety or depression.   DRUG ALLERGIES:   Allergies  Allergen Reactions  . Cefuroxime Axetil     caused rash.  . Ciprofloxacin     Unknown  . Codeine     VITALS:  Blood pressure 108/60, pulse 76, temperature 98.3 F (36.8 C), temperature source Oral, resp. rate 18, height 5\' 7"  (1.702 m), weight 59.4 kg (131 lb), SpO2 91 %.  PHYSICAL EXAMINATION:  GENERAL:  76 y.o.-year-old patient lying in the bed with no acute distress.  EYES: Pupils equal, round, reactive to light and accommodation. No scleral icterus. Extraocular muscles intact.  HEENT: Head atraumatic, normocephalic. Oropharynx and nasopharynx clear.  NECK:  Supple, no jugular venous distention. No thyroid enlargement, no tenderness.  LUNGS: Normal breath sounds bilaterally, no wheezing, rales,rhonchi or crepitation. No use of accessory muscles of respiration.  CARDIOVASCULAR: S1, S2 normal. No murmurs, rubs, or gallops.  ABDOMEN: Soft, nontender, nondistended. Bowel sounds present. No organomegaly or mass.  EXTREMITIES: Left hip with clean honey comb dressing No pedal edema, cyanosis, or clubbing.   NEUROLOGIC: Cranial nerves II through XII are intact. Muscle strength at her baseline except left lower extremity. Sensation intact. Gait not checked.  PSYCHIATRIC: The patient is alert and oriented x 3.  SKIN: No obvious rash, lesion, or ulcer.    LABORATORY PANEL:   CBC  Recent Labs Lab 03/27/17 0448  WBC 7.8  HGB 12.2  HCT 35.2  PLT 143*   ------------------------------------------------------------------------------------------------------------------  Chemistries   Recent Labs Lab 03/27/17 0448  NA 137  K 4.6  CL 106  CO2 26  GLUCOSE 163*  BUN 17  CREATININE 1.26*  CALCIUM 8.9   ------------------------------------------------------------------------------------------------------------------  Cardiac Enzymes No results for input(s): TROPONINI in the last 168 hours. ------------------------------------------------------------------------------------------------------------------  RADIOLOGY:  Dg Chest 1 View  Result Date: 03/25/2017 CLINICAL DATA:  Preop left hip fracture. EXAM: CHEST 1 VIEW COMPARISON:  07/02/2014 FINDINGS: The heart size and mediastinal contours are within normal limits. Aortic atherosclerosis at the arch. Lungs are hyperinflated and there is mild diffuse interstitial prominence which may reflect age related interstitial change or mild fibrosis. Right basilar subpleural atelectasis and/or scarring. No pneumonic consolidation, effusion or pneumothorax. The visualized skeletal structures are unremarkable. IMPRESSION: Mild hyperinflation of the lungs. Mild diffuse interstitial prominence may reflect chronic interstitial lung disease or fibrosis. No pneumonia. Electronically Signed   By: Ashley Royalty M.D.   On: 03/25/2017 17:44   Ct Head Wo Contrast  Result Date: 03/25/2017 CLINICAL DATA:  Initial evaluation for acute trauma, fall. EXAM: CT HEAD WITHOUT CONTRAST CT CERVICAL SPINE WITHOUT CONTRAST TECHNIQUE: Multidetector CT imaging of the head and  cervical spine was performed following the standard protocol without intravenous contrast. Multiplanar  CT image reconstructions of the cervical spine were also generated. COMPARISON:  Prior CT from 07/03/2014. FINDINGS: CT HEAD FINDINGS Brain: Generalized cerebral atrophy with moderate chronic small vessel ischemic disease. No acute intracranial hemorrhage. No evidence for acute large vessel territory infarct. No mass lesion, midline shift or mass effect. No hydrocephalus. No extra-axial fluid collection. Vascular: No hyperdense vessel. Scattered vascular calcifications noted within the carotid siphons. Skull: Scalp soft tissues and calvarium within normal limits. Sinuses/Orbits: Globes and orbital soft tissues normal. Scattered mucosal thickening within the ethmoidal air cells. Paranasal sinuses otherwise clear. No mastoid effusion. Other: None. CT CERVICAL SPINE FINDINGS Alignment: Straightening of the normal cervical lordosis. Grade 1 anterolisthesis of C3 on C4 and C4 on C5. Skull base and vertebrae: Skullbase intact. Normal C1-2 articulations are preserved in the dens is intact. Vertebral body heights maintained. No acute fracture. Soft tissues and spinal canal: Soft tissues of the neck demonstrate no acute abnormality. No prevertebral edema. Vascular calcifications about the carotid bifurcations. Disc levels: Moderate degenerative spondylolysis present at C5-6 and C6-7. Multilevel facet arthrosis noted, greatest within the upper cervical spine. Left C2-3 facets are fused. Upper chest: Visualized upper chest demonstrates no acute abnormality. Paraseptal emphysema noted at the lung apices. Other: None. IMPRESSION: CT BRAIN: 1. No acute intracranial process. 2. Generalized cerebral atrophy with moderate chronic small vessel ischemic disease. CT CERVICAL SPINE: 1. No acute traumatic injury within cervical spine. 2. Straightening of the normal cervical lordosis, which may be related to positioning and/or muscular  spasm. 3. Grade 1 anterolisthesis C3 on C4 and C4 on C5, likely due to chronic facet degeneration. Moderate degenerative spondylolysis at C5-6 and C6-7. Electronically Signed   By: Jeannine Boga M.D.   On: 03/25/2017 18:38   Ct Cervical Spine Wo Contrast  Result Date: 03/25/2017 CLINICAL DATA:  Initial evaluation for acute trauma, fall. EXAM: CT HEAD WITHOUT CONTRAST CT CERVICAL SPINE WITHOUT CONTRAST TECHNIQUE: Multidetector CT imaging of the head and cervical spine was performed following the standard protocol without intravenous contrast. Multiplanar CT image reconstructions of the cervical spine were also generated. COMPARISON:  Prior CT from 07/03/2014. FINDINGS: CT HEAD FINDINGS Brain: Generalized cerebral atrophy with moderate chronic small vessel ischemic disease. No acute intracranial hemorrhage. No evidence for acute large vessel territory infarct. No mass lesion, midline shift or mass effect. No hydrocephalus. No extra-axial fluid collection. Vascular: No hyperdense vessel. Scattered vascular calcifications noted within the carotid siphons. Skull: Scalp soft tissues and calvarium within normal limits. Sinuses/Orbits: Globes and orbital soft tissues normal. Scattered mucosal thickening within the ethmoidal air cells. Paranasal sinuses otherwise clear. No mastoid effusion. Other: None. CT CERVICAL SPINE FINDINGS Alignment: Straightening of the normal cervical lordosis. Grade 1 anterolisthesis of C3 on C4 and C4 on C5. Skull base and vertebrae: Skullbase intact. Normal C1-2 articulations are preserved in the dens is intact. Vertebral body heights maintained. No acute fracture. Soft tissues and spinal canal: Soft tissues of the neck demonstrate no acute abnormality. No prevertebral edema. Vascular calcifications about the carotid bifurcations. Disc levels: Moderate degenerative spondylolysis present at C5-6 and C6-7. Multilevel facet arthrosis noted, greatest within the upper cervical spine. Left  C2-3 facets are fused. Upper chest: Visualized upper chest demonstrates no acute abnormality. Paraseptal emphysema noted at the lung apices. Other: None. IMPRESSION: CT BRAIN: 1. No acute intracranial process. 2. Generalized cerebral atrophy with moderate chronic small vessel ischemic disease. CT CERVICAL SPINE: 1. No acute traumatic injury within cervical spine. 2. Straightening of the normal cervical lordosis, which  may be related to positioning and/or muscular spasm. 3. Grade 1 anterolisthesis C3 on C4 and C4 on C5, likely due to chronic facet degeneration. Moderate degenerative spondylolysis at C5-6 and C6-7. Electronically Signed   By: Jeannine Boga M.D.   On: 03/25/2017 18:38   Dg Hip Operative Unilat With Pelvis Left  Result Date: 03/26/2017 CLINICAL DATA:  Hip fracture EXAM: OPERATIVE left HIP (WITH PELVIS IF PERFORMED) 3 VIEWS TECHNIQUE: Fluoroscopic spot image(s) were submitted for interpretation post-operatively. COMPARISON:  03/25/2017 FINDINGS: Three low resolution intraoperative spot views of the left femur are submitted. The images demonstrate intramedullary rodding of the proximal left femur across an intertrochanteric fracture. IMPRESSION: Intraoperative fluoroscopic assistance provided during surgical fixation of proximal left femur fracture Electronically Signed   By: Donavan Foil M.D.   On: 03/26/2017 18:59   Dg Hip Unilat With Pelvis 2-3 Views Left  Result Date: 03/25/2017 CLINICAL DATA:  Pain after fall today. EXAM: DG HIP (WITH OR WITHOUT PELVIS) 2-3V LEFT COMPARISON:  None. FINDINGS: An acute, closed, varus angulated intertrochanteric fracture of the left femur is identified. No joint dislocations. The no acute displaced appearing pelvic fracture. 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 Degenerative spurring is noted off the acetabular roofs and both femoral heads. IMPRESSION: An acute, closed, varus angulated intertrochanteric fracture of the left  femur is identified. Electronically Signed   By: Ashley Royalty M.D.   On: 03/25/2017 17:38    EKG:   Orders placed or performed during the hospital encounter of 03/25/17  . ED EKG  . ED EKG  . EKG 12-Lead  . EKG 12-Lead    ASSESSMENT AND PLAN:     * left Hip fracture s/p OR+IF pod # 1  f/u by  Dr. Rudene Christians Physical therapy is recommending home health PT [facility] as needed Pain management as needed  * coagulopathy-secondary to Coumadin    INR 1.9--1.5 after oral vitamin K prior to surgery  resume the Coumadin today     * carotid artery stenosis    Pt claims had history of TIA and taking Coumadin regarding the same, doesn't think she has atrial fibrillation Patient [facility] in [facility], questionable atrial fibrillation, diagnosis not confirmed, patient refused a 30 day event monitor which was offered by Dr. Cathie Olden at that time       resume Coumadin on outpatient follow-up with cardiology-[facility]  * Htn   Stable     Cont home meds  * Hyperlipidemia    Cont home meds.  * Active smoking   [place]ld to quit for 4 min.         All the records are reviewed and case discussed with Care Management/Social Workerr. Management plans discussed with the patient, daughter at bedside and they are in agreement.  CODE STATUS: Full code  TOTAL TIME TAKING CARE OF THIS PATIENT: 35 minutes.   POSSIBLE D/C IN 1-2  DAYS, DEPENDING ON CLINICAL CONDITION.  Note: This dictation was prepared with Dragon dictation along with smaller phrase technology. Any transcriptional errors that result from this process are unintentional.   Nicholes Mango M.D on 03/27/2017 at 2:14 PM  Between 7am to 6pm - Pager - [phone] After 6pm go to [email or web address] - password [email or web address] [facility]  [facility]  Office  [phone]  CC: Primary care physician; Roselee Nova, MD

## 2017-03-27 NOTE — Evaluation (Signed)
Occupational Therapy Evaluation Patient Details Name: Christine Burnett MRN: [id number] DOB: 31-Dec-1940 Today's Date: 03/27/2017    History of Present Illness 76yo pt s/p L hip IM nailing on 10/22, due to fall and L intertrochanteric fracture. PMH of CKD, TIA, HTN, HLD, family history of CAD.   Clinical Impression   Pt is 76 year old female s/p L IM nailing, POD#1 for OT evaluation. Pt lives at home with her daughter and good supportive family nearby to help with recovery. Pt was independent in all ADLs prior to surgery and is eager to return to PLOF.  Pt is currently limited in functional ADLs due to pain, decreased ROM, and generalized weakness.  Pt requires minimal assist for LB dressing and bathing skills due to pain and decreased AROM of L LE. Pt/family educated in AE/DME, compression stocking mgt, and home/routines modifications to maximize safety and functional independence. Pt would benefit from skilled OT services for additional education in assistive devices, functional mobility, and education in recommendations for home modifications to increase safety and prevent falls.  Pt is a good candidate for return home with no OT follow up upon discharge from hospital.      Follow Up Recommendations  No OT follow up    Equipment Recommendations  Other (comment) (grab bars in bathroom, reacher, sock aide)    Recommendations for Other Services       Precautions / Restrictions Precautions Precautions: Fall Restrictions Weight Bearing Restrictions: Yes LLE Weight Bearing: Weight bearing as tolerated Other Position/Activity Restrictions: LLE WBAT      Mobility Bed Mobility      General bed mobility comments: deferred, pt up in recliner  Transfers Overall transfer level: Needs assistance Equipment used: Rolling walker (2 wheeled) Transfers: Sit to/from Stand Sit to Stand: Min assist         General transfer comment: Cues for proper hand and leg placement, min assist to  rise to standing, height of bed raised    Balance Overall balance assessment: Needs assistance Sitting-balance support: Feet supported Sitting balance-Leahy Scale: Good Sitting balance - Comments: Pt able to sit at EOB and maintain position with no assistance. Hands on EOB for support   Standing balance support: Bilateral upper extremity supported Standing balance-Leahy Scale: Fair Standing balance comment: Reliance on RW for support, able to WB through LLE, pt reported feeling dizzy                            ADL either performed or assessed with clinical judgement   ADL Overall ADL's : Needs assistance/impaired                                       General ADL Comments: generally min assist for LB ADL tasks, family able to provide level of support. Pt/family educated in AE/DME to improve functional independence with ADL and maximize safety with verbal instruction and visual demonstration. Pt/family verbalized understanding.     Vision Baseline Vision/History: Wears glasses Wears Glasses: At all times Patient Visual Report: No change from baseline       Perception     Praxis      Pertinent Vitals/Pain Pain Assessment: 0-10 Pain Score: 10-Worst pain ever Pain Location: L hip with movement Pain Descriptors / Indicators: Aching;Grimacing;Guarding Pain Intervention(s): Limited activity within patient's tolerance;Monitored during session;RN gave pain meds during session;Premedicated before session;Repositioned  Hand Dominance     Extremity/Trunk Assessment Upper Extremity Assessment Upper Extremity Assessment: Generalized weakness (grossly 4-/5)   Lower Extremity Assessment Lower Extremity Assessment: Defer to PT evaluation;Generalized weakness   Cervical / Trunk Assessment Cervical / Trunk Assessment: Normal   Communication Communication Communication: No difficulties   Cognition Arousal/Alertness: Awake/alert Behavior During Therapy:  WFL for tasks assessed/performed Overall Cognitive Status: Within Functional Limits for tasks assessed                                     General Comments       Exercises Exercises: Other exercises Other Exercises Other Exercises: Pt/family educated in compression stocking mgt, falls prevention strategies, and work simplification/home and routines modifications to maximize safety/independence. Pt/family verbalized understanding.   Shoulder Instructions      Home Living Family/patient expects to be discharged to:: Private residence Living Arrangements: Children Available Help at Discharge: Family;Available 24 hours/day Type of Home: House Home Access: Stairs to enter [employer] of Steps: 2 steps to porch and 1 step into home Entrance Stairs-Rails: Left Home Layout: One level     Bathroom Shower/Tub: Tub/shower unit;Walk-in Psychologist, prison and probation services: Standard     Home Equipment: Shower seat - built in          Prior Functioning/Environment Level of Independence: Independent        Comments: Pt is able to perform all ADLs independently, no additional falls in past 12 mo per pt report other than fall that led to this hospitalization        OT Problem List: Decreased strength;Pain;Decreased range of motion;Decreased safety awareness;Decreased activity tolerance;Decreased knowledge of use of DME or AE;Impaired balance (sitting and/or standing)      OT Treatment/Interventions: Self-care/ADL training;Therapeutic activities;Therapeutic exercise;DME and/or AE instruction;Patient/family education    OT Goals(Current goals can be found in the care plan section) Acute Rehab OT Goals Patient Stated Goal: to return home OT Goal Formulation: With patient/family Time For Goal Achievement: 04/03/17 Potential to Achieve Goals: Good  OT Frequency: Min 1X/week   Barriers to D/C:            Co-evaluation              AM-PAC PT "6 Clicks"  Daily Activity     Outcome Measure Help from another person eating meals?: None Help from another person taking care of personal grooming?: None Help from another person toileting, which includes using toliet, bedpan, or urinal?: A Little Help from another person bathing (including washing, rinsing, drying)?: A Little Help from another person to put on and taking off regular upper body clothing?: None Help from another person to put on and taking off regular lower body clothing?: A Little 6 Click Score: 21   End of Session    Activity Tolerance: Patient tolerated treatment well Patient left: in chair;with call bell/phone within reach;with chair alarm set;with family/visitor present  OT Visit Diagnosis: Unsteadiness on feet (R26.81);History of falling (Z91.81);Muscle weakness (generalized) (M62.81);Pain Pain - Right/Left: Left Pain - part of body: Hip                Time: 2706-2376 OT Time Calculation (min): 26 min Charges:  OT General Charges $OT Visit: 1 Visit OT Evaluation $OT Eval Low Complexity: 1 Low OT Treatments $Self Care/Home Management : 8-22 mins G-Codes: OT G-codes **NOT FOR INPATIENT CLASS** Functional Assessment Tool Used: AM-PAC 6 Clicks Daily Activity;Clinical  judgement Functional Limitation: Self care Self Care Current Status [phone]): At least 20 percent but less than 40 percent impaired, limited or restricted Self Care Goal Status (U8280): At least 1 percent but less than 20 percent impaired, limited or restricted   Jeni Salles, MPH, MS, OTR/L ascom [phone] 03/27/17, 2:39 PM

## 2017-03-27 NOTE — Care Management Note (Addendum)
Case Management Note  Patient Details  Name: Christine Burnett MRN: [id number] Date of Birth: Jul 28, 1940  Subjective/Objective: Met with patient at bedside to discuss discharge planning. Daughter lives in the home with patient. Prior to arrival patient independent and active. Offered choice of home health agencies. Referral to [facility] for HPPT and RN  Ordered walker from [facility]. Denies the need for a bsc. [facility]: [facility] [phone]. Patient on coumadin at baseline. Will monitor for the need for Lovenox.                   Action/Plan: [facility] for walker, [facility] for HHPT/RN  Expected Discharge Date:  03/27/17               Expected Discharge Plan:  [place]  In-House Referral:     Discharge planning Services  CM Consult  Post Acute Care Choice:  Durable Medical Equipment, Home Health Choice offered to:  Patient  DME Arranged:  Walker rolling DME Agency:  [place] Arranged:  PT, RN [facility] Agency:  [facility]  Status of Service:  In process, will continue to follow  If discussed at Long Length of Stay Meetings, dates discussed:    Additional Comments:  Jolly Mango, RN 03/27/2017, 12:11 PM

## 2017-03-27 NOTE — Progress Notes (Signed)
PHARMACIST - PHYSICIAN COMMUNICATION  CONCERNING:  Enoxaparin (Lovenox) for DVT Prophylaxis    RECOMMENDATION: Patient was prescribed enoxaprin 30mg  q24 hours for VTE prophylaxis due to CrCl <91ml/min. CrCl has now improved to >11ml/min.   Filed Weights   03/25/17 1621  Weight: 131 lb (59.4 kg)    Body mass index is 20.52 kg/m.  Estimated Creatinine Clearance: 35.1 mL/min (A) (by C-G formula based on SCr of 1.26 mg/dL (H)).  Patient is now candidate for enoxaparin 40mg  every 24 hours   DESCRIPTION: Pharmacy has adjusted enoxaparin dose per [place], approved through [facility] committee.  Patient is now receiving enoxaparin 40mg  every 24 hours.    Nancy Fetter, PharmD Clinical Pharmacist  03/27/2017 10:57 AM

## 2017-03-27 NOTE — Progress Notes (Signed)
*  PRELIMINARY RESULTS* Echocardiogram 2D Echocardiogram has been performed.  Sherrie Sport 03/27/2017, 2:35 PM

## 2017-03-28 ENCOUNTER — Encounter
Admission: RE | Admit: 2017-03-28 | Discharge: 2017-03-28 | Disposition: A | Discharge status: Home / Self Care | Payer: Medicare Other | Source: Ambulatory Visit | Attending: Internal Medicine | Admitting: Internal Medicine

## 2017-03-28 LAB — PROTIME-INR
INR: 1.14
Prothrombin Time: 14.5 seconds (ref 11.4–15.2)

## 2017-03-28 LAB — BASIC METABOLIC PANEL
ANION GAP: 7 (ref 5–15)
BUN: 23 mg/dL — ABNORMAL HIGH (ref 6–20)
CHLORIDE: 103 mmol/L (ref 101–111)
CO2: 29 mmol/L (ref 22–32)
Calcium: 9.2 mg/dL (ref 8.9–10.3)
Creatinine, Ser: 1.25 mg/dL — ABNORMAL HIGH (ref 0.44–1.00)
GFR calc non Af Amer: 40 mL/min — ABNORMAL LOW (ref >60)
GFR, EST AFRICAN AMERICAN: 47 mL/min — AB (ref >60)
Glucose, Bld: 112 mg/dL — ABNORMAL HIGH (ref 65–99)
Potassium: 4.5 mmol/L (ref 3.5–5.1)
Sodium: 139 mmol/L (ref 135–145)

## 2017-03-28 LAB — CBC
HEMATOCRIT: 34 % — AB (ref 35.0–47.0)
HEMOGLOBIN: 11.6 g/dL — AB (ref 12.0–16.0)
MCH: 34.5 pg — ABNORMAL HIGH (ref 26.0–34.0)
MCHC: 34.2 g/dL (ref 32.0–36.0)
MCV: 100.9 fL — ABNORMAL HIGH (ref 80.0–100.0)
Platelets: 176 10*3/uL (ref 150–440)
RBC: 3.37 MIL/uL — ABNORMAL LOW (ref 3.80–5.20)
RDW: 13.3 % (ref 11.5–14.5)
WBC: 9.9 10*3/uL (ref 3.6–11.0)

## 2017-03-28 MED ORDER — BISACODYL 10 MG RE SUPP
10.0000 mg | Freq: Once | RECTAL | Status: DC
  Filled 2017-03-28: qty 1

## 2017-03-28 MED ORDER — WARFARIN SODIUM 5 MG PO TABS
5.0000 mg | ORAL_TABLET | Freq: Once | ORAL | Status: AC
  Administered 2017-03-28: 5 mg via ORAL
  Filled 2017-03-28: qty 1

## 2017-03-28 NOTE — Progress Notes (Signed)
Physical Therapy Treatment Patient Details Name: Christine Burnett MRN: [id number] DOB: May 23, 1941 Today's Date: 03/28/2017    History of Present Illness 76yo pt s/p L hip IM nailing on 10/22, due to fall and L intertrochanteric fracture. PMH of CKD, TIA, HTN, HLD, family history of CAD.    PT Comments    Pt is making progress with goals. Pt appeared more awake this afternoon. Pt performed there-ex, bed mobility/transfers/amb with RW with min assist. Pt amb to bathroom, performed toileting tasks and amb back to bed, appeared fatigued at end of session. Pt demonstrates carryover with proper transfer/amb technique from this morning, however continues to require frequent cues. Continue to progress with LLE strengthening and mobility. Pt's daughter was present throughout session for encouragement and assistance as needed.    Follow Up Recommendations  SNF     Equipment Recommendations  None recommended by PT (pt has RW in room)    Recommendations for Other Services       Precautions / Restrictions Precautions Precautions: Fall Restrictions Weight Bearing Restrictions: Yes LLE Weight Bearing: Weight bearing as tolerated    Mobility  Bed Mobility Overal bed mobility: Needs Assistance Bed Mobility: Supine to Sit;Sit to Supine     Supine to sit: Min assist Sit to supine: Min assist   General bed mobility comments: assist to guide LEs in/out of bed, utilized bed rails  Transfers Overall transfer level: Needs assistance Equipment used: Rolling walker (2 wheeled) Transfers: Sit to/from Stand Sit to Stand: Min assist         General transfer comment: demonstrates carryover for proper hand placement, slow to rise/sit, able to lower with control  Ambulation/Gait Ambulation/Gait assistance: Min assist Ambulation Distance (Feet): 45 Feet Assistive device: Rolling walker (2 wheeled) Gait Pattern/deviations: Step-to pattern Gait velocity: 0.05 Gait velocity interpretation:  <1.8 ft/sec, indicative of risk for recurrent falls General Gait Details: demonstrates carryover with RW/step sequencing with cues initially, able to navigate RW during turns   Financial trader Rankin (Stroke Patients Only)       Balance Overall balance assessment: Needs assistance Sitting-balance support: Feet supported Sitting balance-Leahy Scale: Good Sitting balance - Comments: able to sit at EOB and 3-in-1 with supervision   Standing balance support: Bilateral upper extremity supported Standing balance-Leahy Scale: Fair Standing balance comment: uses RW for support, able to increase WB through LLE                            Cognition Arousal/Alertness: Awake/alert Behavior During Therapy: WFL for tasks assessed/performed Overall Cognitive Status: Within Functional Limits for tasks assessed                                 General Comments:       Exercises Other Exercises Other Exercises: Therex 20x, B, ankle pumps, quad sets, glute sets, hip abd/add, SLRs, LAQs, seated heel raises. Requires CGA for LLE exercises for proper technique.  Other Exercises: Pt amb to bathroom for BM, assisted pt with toileting tasks, pt amb back to bed    General Comments        Pertinent Vitals/Pain Pain Assessment: Faces Pain Score: 2  Faces Pain Scale: Hurts a little bit Pain Location: L hip Pain Descriptors / Indicators: Aching;Grimacing;Guarding Pain Intervention(s): Limited activity within patient's tolerance;Monitored during session;Repositioned  Home Living                      Prior Function            PT Goals (current goals can now be found in the care plan section) Acute Rehab PT Goals Patient Stated Goal: to get L leg stronger PT Goal Formulation: With patient Time For Goal Achievement: 04/10/17 Potential to Achieve Goals: Good Progress towards PT goals: Progressing toward goals     Frequency    BID      PT Plan Current plan remains appropriate    Co-evaluation              AM-PAC PT "6 Clicks" Daily Activity  Outcome Measure  Difficulty turning over in bed (including adjusting bedclothes, sheets and blankets)?: None Difficulty moving from lying on back to sitting on the side of the bed? : Unable Difficulty sitting down on and standing up from a chair with arms (e.g., wheelchair, bedside commode, etc,.)?: Unable Help needed moving to and from a bed to chair (including a wheelchair)?: A Little Help needed walking in hospital room?: A Little Help needed climbing 3-5 steps with a railing? : A Lot 6 Click Score: 14    End of Session Equipment Utilized During Treatment: Gait belt Activity Tolerance: Patient tolerated treatment well Patient left: in bed;with call bell/phone within reach;with bed alarm set;with family/visitor present   PT Visit Diagnosis: Unsteadiness on feet (R26.81);Other abnormalities of gait and mobility (R26.89);Muscle weakness (generalized) (M62.81);Pain Pain - Right/Left: Right Pain - part of body: Hip     Time: 1401-1445 PT Time Calculation (min) (ACUTE ONLY): 44 min  Charges:                       G Codes:  Functional Assessment Tool Used: AM-PAC 6 Clicks Basic Mobility Functional Limitation: Mobility: Walking and moving around Mobility: Walking and Moving Around Current Status (O7121): At least 40 percent but less than 60 percent impaired, limited or restricted Mobility: Walking and Moving Around Goal Status [phone]): At least 20 percent but less than 40 percent impaired, limited or restricted    Manfred Arch, SPT   [facility] 03/28/2017, 3:00 PM

## 2017-03-28 NOTE — Progress Notes (Signed)
[facility] NAME: Arta Stump    MR#:  [id number]  DATE OF BIRTH:  1941-03-04  SUBJECTIVE:  CHIEF COMPLAINT:  Pt had surgery , OOB to chair , had a BM today, daughter at bedside  REVIEW OF SYSTEMS:  CONSTITUTIONAL: No fever, fatigue or weakness.  EYES: No blurred or double vision.  EARS, NOSE, AND THROAT: No tinnitus or ear pain.  RESPIRATORY: No cough, shortness of breath, wheezing or hemoptysis.  CARDIOVASCULAR: No chest pain, orthopnea, edema.  GASTROINTESTINAL: No nausea, vomiting, diarrhea or abdominal pain.  GENITOURINARY: No dysuria, hematuria.  ENDOCRINE: No polyuria, nocturia,  HEMATOLOGY: No anemia, easy bruising or bleeding SKIN: No rash or lesion. MUSCULOSKELETAL: Reporting hip pain on the left side NEUROLOGIC: No tingling, numbness, weakness.  PSYCHIATRY: No anxiety or depression.   DRUG ALLERGIES:   Allergies  Allergen Reactions  . Cefuroxime Axetil     caused rash.  . Ciprofloxacin     Unknown  . Codeine     VITALS:  Blood pressure 134/62, pulse 76, temperature 98.4 F (36.9 C), temperature source Oral, resp. rate 18, height 5\' 7"  (1.702 m), weight 59.4 kg (131 lb), SpO2 93 %.  PHYSICAL EXAMINATION:  GENERAL:  76 y.o.-year-old patient lying in the bed with no acute distress.  EYES: Pupils equal, round, reactive to light and accommodation. No scleral icterus. Extraocular muscles intact.  HEENT: Head atraumatic, normocephalic. Oropharynx and nasopharynx clear.  NECK:  Supple, no jugular venous distention. No thyroid enlargement, no tenderness.  LUNGS: Normal breath sounds bilaterally, no wheezing, rales,rhonchi or crepitation. No use of accessory muscles of respiration.  CARDIOVASCULAR: S1, S2 normal. No murmurs, rubs, or gallops.  ABDOMEN: Soft, nontender, nondistended. Bowel sounds present. No organomegaly or mass.  EXTREMITIES: Left hip with clean honey comb dressing No pedal edema,  cyanosis, or clubbing.  NEUROLOGIC: Cranial nerves II through XII are intact. Muscle strength at her baseline except left lower extremity. Sensation intact. Gait not checked.  PSYCHIATRIC: The patient is alert and oriented x 3.  SKIN: No obvious rash, lesion, or ulcer.    LABORATORY PANEL:   CBC  Recent Labs Lab 03/28/17 0611  WBC 9.9  HGB 11.6*  HCT 34.0*  PLT 176   ------------------------------------------------------------------------------------------------------------------  Chemistries   Recent Labs Lab 03/28/17 0611  NA 139  K 4.5  CL 103  CO2 29  GLUCOSE 112*  BUN 23*  CREATININE 1.25*  CALCIUM 9.2   ------------------------------------------------------------------------------------------------------------------  Cardiac Enzymes No results for input(s): TROPONINI in the last 168 hours. ------------------------------------------------------------------------------------------------------------------  RADIOLOGY:  Dg Hip Operative Unilat With Pelvis Left  Result Date: 03/26/2017 CLINICAL DATA:  Hip fracture EXAM: OPERATIVE left HIP (WITH PELVIS IF PERFORMED) 3 VIEWS TECHNIQUE: Fluoroscopic spot image(s) were submitted for interpretation post-operatively. COMPARISON:  03/25/2017 FINDINGS: Three low resolution intraoperative spot views of the left femur are submitted. The images demonstrate intramedullary rodding of the proximal left femur across an intertrochanteric fracture. IMPRESSION: Intraoperative fluoroscopic assistance provided during surgical fixation of proximal left femur fracture Electronically Signed   By: Donavan Foil M.D.   On: 03/26/2017 18:59    EKG:   Orders placed or performed during the hospital encounter of 03/25/17  . ED EKG  . ED EKG  . EKG 12-Lead  . EKG 12-Lead    ASSESSMENT AND PLAN:     * left Hip fracture s/p OR+IF pod # 2  f/u by  Dr. Rudene Christians Physical therapy is recommending home health PT  MiraLAX as needed Pain  management as needed  * coagulopathy-secondary to Coumadin    INR 1.14 today  resumed the Coumadin 03/27/2017      * carotid artery stenosis    Pt claims had history of TIA and taking Coumadin regarding the same, doesn't think she has atrial fibrillation Patient was [facility]: [facility] in 2016, questionable atrial fibrillation, diagnosis not confirmed, patient refused a 30 day event monitor which was offered by Dr. Cathie Olden at that time       resume Coumadin on outpatient follow-up with cardiology-[facility]  * Htn   Stable     Cont home meds  * Hyperlipidemia    Cont home meds.  * Active smoking   [place] to quit for 4 min.      Physical therapy reassessed the patient and recommending skilled nursing facility. Patient is agreeable. Follow-up with social worker   All the records are reviewed and case discussed with Care Management/Social Workerr. Management plans discussed with the patient, daughter at bedside and they are in agreement.  CODE STATUS: Full code  TOTAL TIME TAKING CARE OF THIS PATIENT: 35 minutes.   POSSIBLE D/C IN 1-2  DAYS, DEPENDING ON CLINICAL CONDITION.  Note: This dictation was prepared with Dragon dictation along with smaller phrase technology. Any transcriptional errors that result from this process are unintentional.   Nicholes Mango M.D on 03/28/2017 at 3:11 PM  Between 7am to 6pm - Pager - [phone] After 6pm go to [email or web address] - password [email or web address] [facility]  [facility]  Office  [phone]  CC: Primary care physician; Roselee Nova, MD

## 2017-03-28 NOTE — Progress Notes (Signed)
[place] for Warfarin Indication: h/o TIA, questionable atrial fibrillation   Allergies  Allergen Reactions  . Cefuroxime Axetil     caused rash.  . Ciprofloxacin     Unknown  . Codeine     Patient Measurements: Height: 5\' 7"  (170.2 cm) Weight: 131 lb (59.4 kg) IBW/kg (Calculated) : 61.6   Vital Signs: Temp: 98.4 F (36.9 C) (10/24 0736) Temp Source: Oral (10/24 0736) BP: 134/62 (10/24 0736) Pulse Rate: 76 (10/24 0736)  Labs:  Recent Labs  03/25/17 1652 03/26/17 0350  03/26/17 1442 03/27/17 0448 03/28/17 0611  HGB 13.9 12.4  --   --  12.2 11.6*  HCT 40.1 36.1  --   --  35.2 34.0*  PLT 211 176  --   --  143* 176  APTT 34  --   --   --   --   --   LABPROT 21.9* 22.8*  < > 18.0* 14.8 14.5  INR 1.93 2.03  < > 1.50 1.17 1.14  CREATININE 1.61* 1.54*  --   --  1.26* 1.25*  < > = values in this interval not displayed.  Estimated Creatinine Clearance: 35.3 mL/min (A) (by C-G formula based on SCr of 1.25 mg/dL (H)).   Medical History: Past Medical History:  Diagnosis Date  . A-fib ([place])   . Allergic rhinitis   . Anxiety   . Chronic bronchitis ([place])   . CKD (chronic kidney disease), stage III ([place])   . Cornea scar   . Diverticulosis   . Hyperlipidemia   . Hypertension   . Osteoporosis   . Risk for falls   . Situational disturbance   . TIA (transient ischemic attack)     Assessment: Patient is s/p hip fracture repair to resume warfarin and is s/p multiple doses of vitamin K. Patient is also on Lovenox.    INR         Warfarin        Vitamin K  10/22   2.03>1.50      2mg                     5mg  10/23      1.17             4mg                      10/24       1.14  Goal of Therapy:  INR 2-3   Plan:  Due to receiving multiple doses of Vitamin K, will give 5mg  x 1 dose po tonight and recheck INR with AM labs.    Thomasenia Sales, PharmD, BCPS Clinical Pharmacist 03/28/2017 7:43 AM

## 2017-03-28 NOTE — Progress Notes (Signed)
Occupational Therapy Treatment Patient Details Name: Christine Burnett MRN: [id number] DOB: 10-19-1940 Today's Date: 03/28/2017    History of present illness 76yo pt s/p L hip IM nailing on 10/22, due to fall and L intertrochanteric fracture. PMH of CKD, TIA, HTN, HLD, family history of CAD.   OT comments  Pt seen for OT session this date. Pt in recliner and requesting to use bathroom. Pt unsure she would be able to ambulate into the bathroom due to fatigue after PT session and feeling "a little loopy" from medications. Pt transfers from recliner to [facility] ambulating approx 4 feet with RW and min assist to stand, min guard during mobility with verbal cues for safety and sequencing during session. Set up for toileting hygiene. Pt back to bed at end of session. Recommend transition to STR following hospitalization given pt's status.    Follow Up Recommendations  SNF    Equipment Recommendations       Recommendations for Other Services      Precautions / Restrictions Precautions Precautions: Fall Restrictions Weight Bearing Restrictions: Yes LLE Weight Bearing: Weight bearing as tolerated       Mobility Bed Mobility Overal bed mobility: Needs Assistance Bed Mobility: Sit to Supine     Supine to sit: Min assist;Mod assist     General bed mobility comments: LE mgt getting back to bed  Transfers Overall transfer level: Needs assistance Equipment used: Rolling walker (2 wheeled) Transfers: Sit to/from Stand Sit to Stand: Min assist         General transfer comment: Continues to require cues for proper hand placement, slow to rise/sit, able to lower with control     Balance Overall balance assessment: Needs assistance Sitting-balance support: Feet supported Sitting balance-Leahy Scale: Good Sitting balance - Comments: Pt able to sit at EOB and maintain position with no assistance. Hands on EOB for support. Requires rest in seated due to dizziness    Standing balance  support: Bilateral upper extremity supported Standing balance-Leahy Scale: Fair Standing balance comment: Reliance on RW for support, cues for upright posture, requires few seconds rest upon standing due to dizziness                           ADL either performed or assessed with clinical judgement   ADL Overall ADL's : Needs assistance/impaired                         Toilet Transfer: RW;Ambulation;BSC;Cueing for sequencing;Minimal assistance   Toileting- Clothing Manipulation and Hygiene: Set up;Supervision/safety               Vision       Perception     Praxis      Cognition Arousal/Alertness: Suspect due to medications Behavior During Therapy: WFL for tasks assessed/performed Overall Cognitive Status: Within Functional Limits for tasks assessed                                 General Comments: pt noted feeling very fatigued and "loopy", required verbal cues for safety and sequencing        Exercises   Shoulder Instructions       General Comments      Pertinent Vitals/ Pain       Pain Assessment: 0-10 Pain Score: 10-Worst pain ever Pain Location: L hip Pain Descriptors / Indicators: Aching;Grimacing;Guarding Pain  Intervention(s): Limited activity within patient's tolerance;Monitored during session;Premedicated before session;Repositioned  Home Living                                          Prior Functioning/Environment              Frequency  Min 1X/week        Progress Toward Goals  OT Goals(current goals can now be found in the care plan section)  Progress towards OT goals: Not progressing toward goals - comment (pt much more fatigued this date)  Acute Rehab OT Goals Patient Stated Goal: to return home OT Goal Formulation: With patient/family Time For Goal Achievement: 04/03/17 Potential to Achieve Goals: [place] Discharge plan needs to be updated;Frequency remains appropriate     Co-evaluation                 AM-PAC PT "6 Clicks" Daily Activity     Outcome Measure   Help from another person eating meals?: None Help from another person taking care of personal grooming?: None Help from another person toileting, which includes using toliet, bedpan, or urinal?: A Little Help from another person bathing (including washing, rinsing, drying)?: A Lot Help from another person to put on and taking off regular upper body clothing?: A Little Help from another person to put on and taking off regular lower body clothing?: A Lot 6 Click Score: 18    End of Session Equipment Utilized During Treatment: Gait belt;Rolling walker  OT Visit Diagnosis: Unsteadiness on feet (R26.81);History of falling (Z91.81);Muscle weakness (generalized) (M62.81);Pain Pain - Right/Left: Left Pain - part of body: Hip   Activity Tolerance Patient limited by fatigue   Patient Left in bed;with call bell/phone within reach;with bed alarm set;with family/visitor present;with SCD's reapplied   Nurse Communication          Time: 6213-0865 OT Time Calculation (min): 24 min  Charges: OT General Charges $OT Visit: 1 Visit OT Treatments $Self Care/Home Management : 23-37 mins  Jeni Salles, MPH, MS, OTR/L ascom [phone] 03/28/17, 1:19 PM

## 2017-03-28 NOTE — Progress Notes (Signed)
   Subjective: 2 Days Post-Op Procedure(s) (LRB): INTRAMEDULLARY (IM) NAIL INTERTROCHANTRIC (Left) Patient reports pain as mild.  4/10 with standing Patient is well, but has had some minor complaints of dizziness. Dizziness is improving. Denies any CP, SOB, ABD pain. We will continue therapy today.  .  Objective: Vital signs in last 24 hours: Temp:  [98.1 F (36.7 C)-98.9 F (37.2 C)] 98.4 F (36.9 C) (10/24 0736) Pulse Rate:  [72-81] 76 (10/24 0736) Resp:  [18] 18 (10/24 0736) BP: (94-134)/(53-65) 134/62 (10/24 0736) SpO2:  [84 %-95 %] 93 % (10/24 0736)  Intake/Output from previous day: 10/23 0701 - 10/24 0700 In: 1738.8 [P.O.:720; I.V.:1018.8] Out: 650 [Urine:650] Intake/Output this shift: No intake/output data recorded.   Recent Labs  03/25/17 1652 03/26/17 0350 03/27/17 0448 03/28/17 0611  HGB 13.9 12.4 12.2 11.6*    Recent Labs  03/27/17 0448 03/28/17 0611  WBC 7.8 9.9  RBC 3.50* 3.37*  HCT 35.2 34.0*  PLT 143* 176    Recent Labs  03/27/17 0448 03/28/17 0611  NA 137 139  K 4.6 4.5  CL 106 103  CO2 26 29  BUN 17 23*  CREATININE 1.26* 1.25*  GLUCOSE 163* 112*  CALCIUM 8.9 9.2    Recent Labs  03/27/17 0448 03/28/17 0611  INR 1.17 1.14    EXAM General - Patient is Alert, Appropriate and Oriented Extremity - Neurovascular intact Sensation intact distally Intact pulses distally Dorsiflexion/Plantar flexion intact No cellulitis present Compartment soft Dressing - dressing C/D/I and no drainage Motor Function - intact, moving foot and toes well on exam.   Past Medical History:  Diagnosis Date  . A-fib ([place])   . Allergic rhinitis   . Anxiety   . Chronic bronchitis ([place])   . CKD (chronic kidney disease), stage III ([place])   . Cornea scar   . Diverticulosis   . Hyperlipidemia   . Hypertension   . Osteoporosis   . Risk for falls   . Situational disturbance   . TIA (transient ischemic attack)     Assessment/Plan:   2 Days Post-Op  Procedure(s) (LRB): INTRAMEDULLARY (IM) NAIL INTERTROCHANTRIC (Left) Principal Problem:   Hip fracture, unspecified laterality, closed, initial encounter ([place]) Active Problems:   Hip fracture ([place])  Estimated body mass index is 20.52 kg/m as calculated from the following:   Height as of this encounter: 5\' 7"  (1.702 m).   Weight as of this encounter: 59.4 kg (131 lb). Advance diet Up with therapy  INR subtherapeutic. Continue with lovenox and coumadin CM to assist with discharge Follow up with [place] [facility] in 6 weeks for recheck Staple removal and steristrip application 54/62/70  DVT Prophylaxis - Lovenox and Coumadin Weight-Bearing as tolerated to left leg   T. Rachelle Hora, PA-C [place] 03/28/2017, 7:49 AM

## 2017-03-28 NOTE — Care Management (Signed)
Pt recommending SNF. CSW aware.

## 2017-03-28 NOTE — Progress Notes (Signed)
Physical Therapy Treatment Patient Details Name: Christine Burnett MRN: [id number] DOB: Apr 26, 1941 Today's Date: 03/28/2017    History of Present Illness 76yo pt s/p L hip IM nailing on 10/22, due to fall and L intertrochanteric fracture. PMH of CKD, TIA, HTN, HLD, family history of CAD.    PT Comments    Pt is making slow progress towards goals. Pt reported feeling very fatigued today. Pt reviewed written HEP, performed bed mobility/transfers/amb with RW with min assist. Pt amb a total of 45 feet, required three seated rest breaks, continues to require frequent cuing for proper step sequence with amb. Pt limited in further amb due to fatigue, SOB, and weakness. Assisted pt to bathroom for toileting. Physician was present during  session to check on pt's mobility status, discussed change in DC recommendation to SNF, pt agreed. Will continue to progress with mobility and quality amb.    Follow Up Recommendations  SNF     Equipment Recommendations  None recommended by PT (Ordered RW delivered to room)    Recommendations for Other Services       Precautions / Restrictions Precautions Precautions: Fall Restrictions Weight Bearing Restrictions: Yes LLE Weight Bearing: Weight bearing as tolerated    Mobility  Bed Mobility Overal bed mobility: Needs Assistance Bed Mobility: Supine to Sit     Supine to sit: Min assist     General bed mobility comments: Min assist to guide LEs and trunk off bed, pt utilized bed rail pull trunk forward, able to scoot to EOB with effort  Transfers Overall transfer level: Needs assistance Equipment used: Rolling walker (2 wheeled) Transfers: Sit to/from Stand Sit to Stand: Min assist         General transfer comment: Continues to require cues for proper hand placement, slow to rise/sit, able to lower with control   Ambulation/Gait Ambulation/Gait assistance: Min assist Ambulation Distance (Feet): 45 Feet Assistive device: Rolling walker (2  wheeled) Gait Pattern/deviations: Step-to pattern Gait velocity: 0.05 Gait velocity interpretation: <1.8 ft/sec, indicative of risk for recurrent falls General Gait Details: Cues for proper sequencing of RW and steps, inconsistent step lengths, required rest break every 15 ft due to fatigue. Legs appeared shaky towards end of amb   Stairs            Wheelchair Mobility    Modified Rankin (Stroke Patients Only)       Balance Overall balance assessment: Needs assistance Sitting-balance support: Feet supported Sitting balance-Leahy Scale: Good Sitting balance - Comments: Pt able to sit at EOB and maintain position with no assistance. Hands on EOB for support. Requires rest in seated due to dizziness    Standing balance support: Bilateral upper extremity supported Standing balance-Leahy Scale: Fair Standing balance comment: Reliance on RW for support, cues for upright posture, requires few seconds rest upon standing due to dizziness                            Cognition Arousal/Alertness: Awake/alert Behavior During Therapy: WFL for tasks assessed/performed;Anxious Overall Cognitive Status: Within Functional Limits for tasks assessed                                        Exercises Other Exercises Other Exercises: Supine ther-ex, 15x B, ankle pumps, hip abd/add, cues for proper technique. Provided exercise packet Other Exercises: Pt amb to bathroom for BM,  assisted pt with toileting tasks    General Comments        Pertinent Vitals/Pain Pain Assessment: 0-10 Pain Score: 2  Pain Location: L hip Pain Intervention(s): Limited activity within patient's tolerance;Monitored during session;Repositioned    Home Living                      Prior Function            PT Goals (current goals can now be found in the care plan section) Acute Rehab PT Goals Patient Stated Goal: to return home PT Goal Formulation: With patient Time For  Goal Achievement: 04/10/17 Potential to Achieve Goals: Good Progress towards PT goals: Progressing toward goals    Frequency    BID      PT Plan Discharge plan needs to be updated    Co-evaluation              AM-PAC PT "6 Clicks" Daily Activity  Outcome Measure  Difficulty turning over in bed (including adjusting bedclothes, sheets and blankets)?: Unable Difficulty moving from lying on back to sitting on the side of the bed? : Unable Difficulty sitting down on and standing up from a chair with arms (e.g., wheelchair, bedside commode, etc,.)?: Unable Help needed moving to and from a bed to chair (including a wheelchair)?: A Little Help needed walking in hospital room?: A Little Help needed climbing 3-5 steps with a railing? : A Lot 6 Click Score: 11    End of Session Equipment Utilized During Treatment: Gait belt Activity Tolerance: Patient tolerated treatment well;Patient limited by fatigue Patient left: in chair;with call bell/phone within reach;with chair alarm set;with family/visitor present   PT Visit Diagnosis: Unsteadiness on feet (R26.81);Other abnormalities of gait and mobility (R26.89);Muscle weakness (generalized) (M62.81);Pain Pain - part of body: Hip     Time: 0300-9233 PT Time Calculation (min) (ACUTE ONLY): 41 min  Charges:                       G Codes:  Functional Assessment Tool Used: AM-PAC 6 Clicks Basic Mobility Functional Limitation: Mobility: Walking and moving around Mobility: Walking and Moving Around Current Status (A0762): At least 60 percent but less than 80 percent impaired, limited or restricted Mobility: Walking and Moving Around Goal Status [phone]): At least 40 percent but less than 60 percent impaired, limited or restricted    Manfred Arch, SPT   Manfred Arch 03/28/2017, 11:28 AM

## 2017-03-28 NOTE — Progress Notes (Signed)
PT has changed their recommendation from home health to SNF. Clinical Social Worker (CSW) met with patient and her daughter [name] to present SNF bed offers. Per [name] she will review offers with the family and get back to CSW.   [employer], LCSW [phone]

## 2017-03-28 NOTE — Progress Notes (Signed)
Patient's daughter called Clinical Social Worker (CSW) back and reported that [facility] is their choice and patient is agreeable to a semi-private room. [facility] admissions coordinator at [facility] is aware of above and stated that a semi-private room will be available tomorrow. CSW will continue to follow and assist as needed.   [employer], LCSW [phone]

## 2017-03-28 NOTE — Consult Note (Signed)
Christine Burnett is a 76 y.o. female  [id number]  Primary Cardiologist: Dr. Neoma Laming Reason for Consultation: CHF  HPI:76  Brionna Romanek is a 76yo female with a known history of CKD, TIA, THN, hyperlipidemia, past history of afib who presented with an accidental fall due to loss of balance on 03/25/2017. No chest pain or palpitations. Femur was fractured and needed to be admitted. Cardiology consult was placed at that time but we were not informed until today.    Review of Systems: No chest pain or shortness of breath.  Has a history of dizziness and reported she had some carotid stenosis at one point. She does not see a cardiologist regularly, she does not like to visit specialists.    Past Medical History:  Diagnosis Date  . A-fib ([place])   . Allergic rhinitis   . Anxiety   . Chronic bronchitis ([place])   . CKD (chronic kidney disease), stage III ([place])   . Cornea scar   . Diverticulosis   . Hyperlipidemia   . Hypertension   . Osteoporosis   . Risk for falls   . Situational disturbance   . TIA (transient ischemic attack)     Medications Prior to Admission  Medication Sig Dispense Refill  . atenolol (TENORMIN) 25 MG tablet Take 1 tablet (25 mg total) by mouth daily. 90 tablet 0  . PARoxetine (PAXIL) 30 MG tablet Take 1 tablet (30 mg total) by mouth daily. 90 tablet 0  . pravastatin (PRAVACHOL) 40 MG tablet Take 1 tablet (40 mg total) by mouth daily. Please put on file. Patient will call when finishing what she have. 90 tablet 0  . warfarin (COUMADIN) 2 MG tablet Take 1 tablet (2 mg total) by mouth daily at 6 PM. 90 tablet 3  . albuterol (PROVENTIL HFA;VENTOLIN HFA) 108 (90 Base) MCG/ACT inhaler Inhale 2 puffs into the lungs every 6 (six) hours as needed for wheezing or shortness of breath. 1 Inhaler 2  . ALPRAZolam (XANAX) 0.25 MG tablet Take 1 tablet (0.25 mg total) by mouth at bedtime as needed for anxiety. (Patient not taking: Reported on 03/25/2017) 30 tablet 2      . atenolol  25 mg Oral Daily  . docusate sodium  100 mg Oral BID  . enoxaparin (LOVENOX) injection  40 mg Subcutaneous Q24H  . PARoxetine  30 mg Oral Daily  . polyethylene glycol  17 g Oral Daily  . pravastatin  40 mg Oral QPM  . warfarin  5 mg Oral ONCE-1800  . Warfarin - Pharmacist Dosing Inpatient   Does not apply q1800    Infusions: . sodium chloride 75 mL/hr at 03/26/17 2138    Allergies  Allergen Reactions  . Cefuroxime Axetil     caused rash.  . Ciprofloxacin     Unknown  . Codeine     Social History   Social History  . Marital status: Divorced    Spouse name: N/A  . Number of children: N/A  . Years of education: N/A   Occupational History  . Not on file.   Social History Main Topics  . Smoking status: Current Every Day Smoker    Packs/day: 0.25    Years: 61.00    Types: Cigarettes  . Smokeless tobacco: Never Used  . Alcohol use No  . Drug use: No  . Sexual activity: No   Other Topics Concern  . Not on file   Social History Narrative  . No narrative on file  Family History  Problem Relation Age of Onset  . Hypertension Mother   . Other Mother        [place] posioning  . Heart attack Father   . Prostate cancer Father   . Kidney cancer Neg Hx   . Bladder Cancer Neg Hx     PHYSICAL EXAM: Vitals:   03/28/17 0400 03/28/17 0736  BP: 125/65 134/62  Pulse: 72 76  Resp: 18 18  Temp: 98.2 F (36.8 C) 98.4 F (36.9 C)  SpO2: 92% 93%     Intake/Output Summary (Last 24 hours) at 03/28/17 1303 Last data filed at 03/28/17 1013  Gross per 24 hour  Intake          1498.75 ml  Output              650 ml  Net           848.75 ml    General:  Well appearing. No respiratory difficulty HEENT: normal Neck: supple. no JVD. Carotids 2+ bilat; no bruits. No lymphadenopathy or thryomegaly appreciated. Cor: PMI nondisplaced. Regular rate & rhythm. No rubs, gallops or murmurs. Lungs: clear Abdomen: soft, nontender, nondistended. No  hepatosplenomegaly. No bruits or masses. Good bowel sounds. Extremities: no cyanosis, clubbing, rash, edema Neuro: alert & oriented x 3, cranial nerves grossly intact. moves all 4 extremities w/o difficulty. Affect pleasant.  Telemetry: Not available, regular by auscultation  Results for orders placed or performed during the hospital encounter of 03/25/17 (from the past 24 hour(s))  Protime-INR     Status: None   Collection Time: 03/28/17  6:11 AM  Result Value Ref Range   Prothrombin Time 14.5 11.4 - 15.2 seconds   INR 1.14   CBC     Status: Abnormal   Collection Time: 03/28/17  6:11 AM  Result Value Ref Range   WBC 9.9 3.6 - 11.0 K/uL   RBC 3.37 (L) 3.80 - 5.20 MIL/uL   Hemoglobin 11.6 (L) 12.0 - 16.0 g/dL   HCT 34.0 (L) 35.0 - 47.0 %   MCV 100.9 (H) 80.0 - 100.0 fL   MCH 34.5 (H) 26.0 - 34.0 pg   MCHC 34.2 32.0 - 36.0 g/dL   RDW 13.3 11.5 - 14.5 %   Platelets 176 150 - 440 K/uL  Basic metabolic panel     Status: Abnormal   Collection Time: 03/28/17  6:11 AM  Result Value Ref Range   Sodium 139 135 - 145 mmol/L   Potassium 4.5 3.5 - 5.1 mmol/L   Chloride 103 101 - 111 mmol/L   CO2 29 22 - 32 mmol/L   Glucose, Bld 112 (H) 65 - 99 mg/dL   BUN 23 (H) 6 - 20 mg/dL   Creatinine, Ser 1.25 (H) 0.44 - 1.00 mg/dL   Calcium 9.2 8.9 - 10.3 mg/dL   GFR calc non Af Amer 40 (L) >60 mL/min   GFR calc Af Amer 47 (L) >60 mL/min   Anion gap 7 5 - 15   Dg Hip Operative Unilat With Pelvis Left  Result Date: 03/26/2017 CLINICAL DATA:  Hip fracture EXAM: OPERATIVE left HIP (WITH PELVIS IF PERFORMED) 3 VIEWS TECHNIQUE: Fluoroscopic spot image(s) were submitted for interpretation post-operatively. COMPARISON:  03/25/2017 FINDINGS: Three low resolution intraoperative spot views of the left femur are submitted. The images demonstrate intramedullary rodding of the proximal left femur across an intertrochanteric fracture. IMPRESSION: Intraoperative fluoroscopic assistance provided during surgical  fixation of proximal left femur fracture Electronically Signed   By:  Donavan Foil M.D.   On: 03/26/2017 18:59     ASSESSMENT AND PLAN: T-wave changes on EKG noted with mild pulmonary hypertension, normal wall motion, and normal LVEF 65-70% on echo. Pt has a history of dizziness and a recent fall. Recommend outpatient stress testing, CTA carotids, and Holter monitor to rule out cardiac/vascular causes of dizziness. Follow up at [facility] for cardiology follow up after discharge.   Jake Bathe

## 2017-03-29 DIAGNOSIS — M6281 Muscle weakness (generalized): Secondary | ICD-10-CM | POA: Diagnosis not present

## 2017-03-29 DIAGNOSIS — M84359S Stress fracture, hip, unspecified, sequela: Secondary | ICD-10-CM | POA: Diagnosis not present

## 2017-03-29 DIAGNOSIS — S72009A Fracture of unspecified part of neck of unspecified femur, initial encounter for closed fracture: Secondary | ICD-10-CM | POA: Diagnosis not present

## 2017-03-29 DIAGNOSIS — Z7901 Long term (current) use of anticoagulants: Secondary | ICD-10-CM | POA: Diagnosis not present

## 2017-03-29 DIAGNOSIS — Z8673 Personal history of transient ischemic attack (TIA), and cerebral infarction without residual deficits: Secondary | ICD-10-CM | POA: Diagnosis not present

## 2017-03-29 DIAGNOSIS — M25552 Pain in left hip: Secondary | ICD-10-CM | POA: Diagnosis not present

## 2017-03-29 DIAGNOSIS — I10 Essential (primary) hypertension: Secondary | ICD-10-CM | POA: Diagnosis not present

## 2017-03-29 DIAGNOSIS — I480 Paroxysmal atrial fibrillation: Secondary | ICD-10-CM | POA: Diagnosis not present

## 2017-03-29 DIAGNOSIS — N183 Chronic kidney disease, stage 3 (moderate): Secondary | ICD-10-CM | POA: Diagnosis not present

## 2017-03-29 DIAGNOSIS — R42 Dizziness and giddiness: Secondary | ICD-10-CM | POA: Diagnosis not present

## 2017-03-29 DIAGNOSIS — I4891 Unspecified atrial fibrillation: Secondary | ICD-10-CM | POA: Diagnosis not present

## 2017-03-29 DIAGNOSIS — D689 Coagulation defect, unspecified: Secondary | ICD-10-CM | POA: Diagnosis not present

## 2017-03-29 DIAGNOSIS — R262 Difficulty in walking, not elsewhere classified: Secondary | ICD-10-CM | POA: Diagnosis not present

## 2017-03-29 DIAGNOSIS — I129 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 stage 4 chronic kidney disease, or unspecified chronic kidney disease: Secondary | ICD-10-CM | POA: Diagnosis not present

## 2017-03-29 DIAGNOSIS — Z87891 Personal history of nicotine dependence: Secondary | ICD-10-CM | POA: Diagnosis not present

## 2017-03-29 DIAGNOSIS — M80052D Age-related osteoporosis with current pathological fracture, left femur, subsequent encounter for fracture with routine healing: Secondary | ICD-10-CM | POA: Diagnosis not present

## 2017-03-29 DIAGNOSIS — M8000XD Age-related osteoporosis with current pathological fracture, unspecified site, subsequent encounter for fracture with routine healing: Secondary | ICD-10-CM | POA: Diagnosis not present

## 2017-03-29 DIAGNOSIS — F419 Anxiety disorder, unspecified: Secondary | ICD-10-CM | POA: Diagnosis not present

## 2017-03-29 DIAGNOSIS — Z9181 History of falling: Secondary | ICD-10-CM | POA: Diagnosis not present

## 2017-03-29 DIAGNOSIS — Z7401 Bed confinement status: Secondary | ICD-10-CM | POA: Diagnosis not present

## 2017-03-29 DIAGNOSIS — I6529 Occlusion and stenosis of unspecified carotid artery: Secondary | ICD-10-CM | POA: Diagnosis not present

## 2017-03-29 DIAGNOSIS — F17200 Nicotine dependence, unspecified, uncomplicated: Secondary | ICD-10-CM | POA: Diagnosis not present

## 2017-03-29 DIAGNOSIS — I959 Hypotension, unspecified: Secondary | ICD-10-CM | POA: Diagnosis not present

## 2017-03-29 DIAGNOSIS — R55 Syncope and collapse: Secondary | ICD-10-CM | POA: Diagnosis not present

## 2017-03-29 DIAGNOSIS — Z5181 Encounter for therapeutic drug level monitoring: Secondary | ICD-10-CM | POA: Diagnosis not present

## 2017-03-29 DIAGNOSIS — E785 Hyperlipidemia, unspecified: Secondary | ICD-10-CM | POA: Diagnosis not present

## 2017-03-29 DIAGNOSIS — S72002D Fracture of unspecified part of neck of left femur, subsequent encounter for closed fracture with routine healing: Secondary | ICD-10-CM | POA: Diagnosis not present

## 2017-03-29 LAB — PROTIME-INR
INR: 1.19
Prothrombin Time: 15 seconds (ref 11.4–15.2)

## 2017-03-29 MED ORDER — ENOXAPARIN SODIUM 40 MG/0.4ML ~~LOC~~ SOLN: 40.0000 mg | SUBCUTANEOUS | 0 refills | Status: DC

## 2017-03-29 MED ORDER — DOCUSATE SODIUM 100 MG PO CAPS: 100.0000 mg | ORAL_CAPSULE | Freq: Two times a day (BID) | ORAL | 0 refills | Status: DC | PRN

## 2017-03-29 MED ORDER — ALUM & MAG HYDROXIDE-SIMETH 200-200-20 MG/5ML PO SUSP: 30.0000 mL | ORAL | 0 refills | Status: DC | PRN

## 2017-03-29 MED ORDER — HYDROCODONE-ACETAMINOPHEN 5-325 MG PO TABS: 1.0000 | ORAL_TABLET | Freq: Four times a day (QID) | ORAL | 0 refills | Status: DC | PRN

## 2017-03-29 MED ORDER — WARFARIN SODIUM 4 MG PO TABS
4.0000 mg | ORAL_TABLET | Freq: Once | ORAL | Status: DC
  Filled 2017-03-29: qty 1

## 2017-03-29 MED ORDER — ONDANSETRON HCL 4 MG PO TABS: 4.0000 mg | ORAL_TABLET | Freq: Four times a day (QID) | ORAL | 0 refills | Status: DC | PRN

## 2017-03-29 MED ORDER — WARFARIN SODIUM 4 MG PO TABS: 4.0000 mg | ORAL_TABLET | Freq: Once | ORAL | 0 refills | Status: DC

## 2017-03-29 MED ORDER — ACETAMINOPHEN 325 MG PO TABS: 650.0000 mg | ORAL_TABLET | Freq: Four times a day (QID) | ORAL | Status: DC | PRN

## 2017-03-29 NOTE — Progress Notes (Signed)
Pt discharged to [facility] via stretcher via non-emergency transport without incident per MD order. No change in pt from AM assessment. Pt denies pain on discharge. Pt d/c with d/c packet.

## 2017-03-29 NOTE — Progress Notes (Signed)
Pt assisted to ambulate to bathroom this AM. Pt and family reports a baseline h/o dizziness with sitting/standing. Per pt dizziness in sitting in position is no change from baseline. Per daughter, Christine Burnett, pt to follow up with MD on d/c to run "carotid tests". Will continue to monitor.

## 2017-03-29 NOTE — Progress Notes (Signed)
Patient is medically stable for D/C to [facility] today. Per [facility] admissions coordinator at [facility] patient can come today to room 209-A. RN will call report at [phone] and arrange EMS for transport. Clinical Education officer, museum (CSW) sent D/C orders to [facility] via [employer]. Patient is aware of above. Patient's daughter [name] is at bedside and aware of above. Please reconsult if future social work needs arise. CSW signing off.   [employer], LCSW [phone]

## 2017-03-29 NOTE — Progress Notes (Signed)
Rept called to [facility] at [place]. No change in pt from AM assessment. Family at bedside and aware of transport. Pain controlled on d/c. Non emergency transport called.

## 2017-03-29 NOTE — Clinical Social Work Placement (Signed)
   CLINICAL SOCIAL WORK PLACEMENT  NOTE  Date:  03/29/2017  Patient Details  Name: Christine Burnett MRN: [id number] Date of Birth: Oct 09, 1940  Clinical Social Work is seeking post-discharge placement for this patient at the [place] level of care (*CSW will initial, date and re-position this form in  chart as items are completed):  Yes   Patient/family provided with [facility] Work Department's list of facilities offering this level of care within the geographic area requested by the patient (or if unable, by the patient's family).  Yes   Patient/family informed of their freedom to choose among providers that offer the needed level of care, that participate in Medicare, Medicaid or managed care program needed by the patient, have an available bed and are willing to accept the patient.  Yes   Patient/family informed of [facility]'s ownership interest in [facility] and [facility], as well as of the fact that they are under no obligation to receive care at these facilities.  PASRR submitted to EDS on 03/26/17     PASRR number received on 03/26/17     Existing PASRR number confirmed on       FL2 transmitted to all facilities in geographic area requested by pt/family on 03/26/17     FL2 transmitted to all facilities within larger geographic area on       Patient informed that his/her managed care company has contracts with or will negotiate with certain facilities, including the following:        Yes   Patient/family informed of bed offers received.  Patient chooses bed at  [facility] )     Physician recommends and patient chooses bed at      Patient to be transferred to  [facility] ) on 03/29/17.  Patient to be transferred to facility by  [facility] )     Patient family notified on 03/29/17 of transfer.  Name of family member notified:   (Patient's daughter [name] is at bedside and aware of D/C today. )      PHYSICIAN       Additional Comment:    _______________________________________________ Stevee Valenta, Veronia Beets, LCSW 03/29/2017, 2:38 PM

## 2017-03-29 NOTE — Progress Notes (Signed)
[facility] for Warfarin Indication: h/o TIA, questionable atrial fibrillation   Allergies  Allergen Reactions  . Cefuroxime Axetil     caused rash.  . Ciprofloxacin     Unknown  . Codeine     Patient Measurements: Height: 5\' 7"  (170.2 cm) Weight: 131 lb (59.4 kg) IBW/kg (Calculated) : 61.6   Vital Signs: Temp: 98.3 F (36.8 C) (10/25 0731) Temp Source: Oral (10/25 0731) BP: 142/65 (10/25 0731) Pulse Rate: 76 (10/25 0731)  Labs:  Recent Labs  03/27/17 0448 03/28/17 0611 03/29/17 0440  HGB 12.2 11.6*  --   HCT 35.2 34.0*  --   PLT 143* 176  --   LABPROT 14.8 14.5 15.0  INR 1.17 1.14 1.19  CREATININE 1.26* 1.25*  --     Estimated Creatinine Clearance: 35.3 mL/min (A) (by C-G formula based on SCr of 1.25 mg/dL (H)).   Medical History: Past Medical History:  Diagnosis Date  . A-fib ([place])   . Allergic rhinitis   . Anxiety   . Chronic bronchitis ([place])   . CKD (chronic kidney disease), stage III ([place])   . Cornea scar   . Diverticulosis   . Hyperlipidemia   . Hypertension   . Osteoporosis   . Risk for falls   . Situational disturbance   . TIA (transient ischemic attack)     Assessment: Patient is s/p hip fracture repair to resume warfarin and is s/p multiple doses of vitamin K. Patient is also on Lovenox. Home dose warfarin PO 2mg  daily.    INR         Warfarin        Vitamin K  10/22   2.03>1.50      2mg                     5mg  10/23      1.17             4mg                      10/24      1.14  5mg   10/25    1.19     Goal of Therapy:  INR 2-3   Plan:  Due to INR subtherapeutic 1.19, will give warfarin PO 4mg  tonight and recheck INR tomorrow with AM labs.    [place] Resident  03/29/2017 9:12 AM

## 2017-03-29 NOTE — Discharge Summary (Signed)
[facility] [place] NAME: Christine Burnett    MR#:  [id number]  DATE OF BIRTH:  [birth year]  DATE OF ADMISSION:  03/25/2017 ADMITTING PHYSICIAN: [name], MD  DATE OF DISCHARGE: 03/29/17  PRIMARY CARE PHYSICIAN: Roselee Nova, MD    ADMISSION DIAGNOSIS:  Closed fracture of left hip, initial encounter ([place]) [S72.002A] Minor head injury, initial encounter [S09.90XA] Fall, initial encounter [W19.XXXA]  DISCHARGE DIAGNOSIS:  Principal Problem:   Hip fracture, unspecified laterality, closed, initial encounter [facility]) Active Problems:   Hip fracture ([place])   SECONDARY DIAGNOSIS:   Past Medical History:  Diagnosis Date  . A-fib ([place])   . Allergic rhinitis   . Anxiety   . Chronic bronchitis ([place])   . CKD (chronic kidney disease), stage III ([place])   . Cornea scar   . Diverticulosis   . Hyperlipidemia   . Hypertension   . Osteoporosis   . Risk for falls   . Situational disturbance   . TIA (transient ischemic attack)     HOSPITAL COURSE:   HISTORY OF PRESENT ILLNESS: Christine Burnett  is a 76 y.o. female with a known history of CKD, TIA, Htn, HLD- Noted in past Hx - a fib- but ot refuse of having A fib, told " she takes Warfarin for carotid artery stenosis" Had an accidental fall due to loss of balance today. No LOC, Chest pain, palpitation- Have fractured femur and need to be admitted.  INR is 1.9 due to warfarin.  * left Hip fracture s/p OR+IF  f/u by  Dr. Rudene Christians in 6 weeks and for staple removal on 04/10/2017 MiraLAX as needed Pain management as needed  * coagulopathy-secondary to Coumadin INR 1.19 today  resumed the Coumadin 03/27/2017  Please repeat PT/INR on Sunday and PCP at the facility to follow up on the results and further Coumadin management by her primary care physician  * carotid artery stenosis Pt claims had history of TIA and taking Coumadin regarding the same, doesn't think  she has atrial fibrillation Patient was seen by [facility] cardiology in 2016, questionable atrial fibrillation, diagnosis not confirmed, patient refused a 30 day event monitor which was offered by Dr. Cathie Olden at that time resume Coumadin on outpatient follow-up with cardiology-[facility] or dr.Khan in 2 weeks  * Htn Stable  Cont home meds  * Hyperlipidemia Cont home meds.  * Active smoking Counceleld to quit for 4 min.    Physical therapy reassessed the patient and recommending skilled nursing facility. Patient is agreeable. Follow-up with social worker  DISCHARGE CONDITIONS:   stable  CONSULTS OBTAINED:  Treatment Team:  Hessie Knows, MD Dionisio David, MD   PROCEDURES   DRUG ALLERGIES:   Allergies  Allergen Reactions  . Cefuroxime Axetil     caused rash.  . Ciprofloxacin     Unknown  . Codeine     DISCHARGE MEDICATIONS:   Current Discharge Medication List    START taking these medications   Details  acetaminophen (TYLENOL) 325 MG tablet Take 2 tablets (650 mg total) by mouth every 6 (six) hours as needed for mild pain, fever or headache (headache).    alum & mag hydroxide-simeth (MAALOX/MYLANTA) 200-200-20 MG/5ML suspension Take 30 mLs by mouth every 4 (four) hours as needed for indigestion. Qty: 355 mL, Refills: 0    docusate sodium (COLACE) 100 MG capsule Take 1 capsule (100 mg total) by mouth 2 (two) times daily as needed for mild  constipation. Qty: 10 capsule, Refills: 0    HYDROcodone-acetaminophen (NORCO/VICODIN) 5-325 MG tablet Take 1-2 tablets by mouth every 6 (six) hours as needed for moderate pain. Qty: 30 tablet, Refills: 0    ondansetron (ZOFRAN) 4 MG tablet Take 1 tablet (4 mg total) by mouth every 6 (six) hours as needed for nausea. Qty: 20 tablet, Refills: 0      CONTINUE these medications which have CHANGED   Details  warfarin (COUMADIN) 4 MG tablet Take 1 tablet (4 mg total) by mouth one time only at 6  PM. Qty: 2 tablet, Refills: 0      CONTINUE these medications which have NOT CHANGED   Details  atenolol (TENORMIN) 25 MG tablet Take 1 tablet (25 mg total) by mouth daily. Qty: 90 tablet, Refills: 0   Associated Diagnoses: Essential hypertension    PARoxetine (PAXIL) 30 MG tablet Take 1 tablet (30 mg total) by mouth daily. Qty: 90 tablet, Refills: 0   Associated Diagnoses: Anxiety    pravastatin (PRAVACHOL) 40 MG tablet Take 1 tablet (40 mg total) by mouth daily. Please put on file. Patient will call when finishing what she have. Qty: 90 tablet, Refills: 0   Associated Diagnoses: Hypertriglyceridemia    albuterol (PROVENTIL HFA;VENTOLIN HFA) 108 (90 Base) MCG/ACT inhaler Inhale 2 puffs into the lungs every 6 (six) hours as needed for wheezing or shortness of breath. Qty: 1 Inhaler, Refills: 2   Associated Diagnoses: Bronchitis    ALPRAZolam (XANAX) 0.25 MG tablet Take 1 tablet (0.25 mg total) by mouth at bedtime as needed for anxiety. Qty: 30 tablet, Refills: 2   Associated Diagnoses: Anxiety         DISCHARGE INSTRUCTIONS:   Follow-up with primary care physician in 3-4 days  Repeat PT/INR on 03/31/2017 and further Coumadin management by primary care physician at the facility Follow-up with cardiology in 2 weeks Follow-up with orthopedics in 6 weeks and staple removal on 04/10/2017  DIET:  Cardiac diet  DISCHARGE CONDITION:  Stable  ACTIVITY:  Activity as tolerated per PT  OXYGEN:  Home Oxygen: No.   Oxygen Delivery: room air  DISCHARGE LOCATION:  nursing home   If you experience worsening of your admission symptoms, develop shortness of breath, life threatening emergency, suicidal or homicidal thoughts you must seek medical attention immediately by calling 911 or calling your MD immediately  if symptoms less severe.  You Must read complete instructions/literature along with all the possible adverse reactions/side effects for all the Medicines you take and  that have been prescribed to you. Take any new Medicines after you have completely understood and accpet all the possible adverse reactions/side effects.   Please note  You were cared for by a hospitalist during your hospital stay. If you have any questions about your discharge medications or the care you received while you were in the hospital after you are discharged, you can call the unit and asked to speak with the hospitalist on call if the hospitalist that took care of you is not available. Once you are discharged, your primary care physician will handle any further medical issues. Please note that NO REFILLS for any discharge medications will be authorized once you are discharged, as it is imperative that you return to your primary care physician (or establish a relationship with a primary care physician if you do not have one) for your aftercare needs so that they can reassess your need for medications and monitor your lab values.     Today  Chief Complaint  Patient presents with  . Fall     ROS:  CONSTITUTIONAL: Denies fevers, chills. Denies any fatigue, weakness.  EYES: Denies blurry vision, double vision, eye pain. EARS, NOSE, THROAT: Denies tinnitus, ear pain, hearing loss. RESPIRATORY: Denies cough, wheeze, shortness of breath.  CARDIOVASCULAR: Denies chest pain, palpitations, edema.  GASTROINTESTINAL: Denies nausea, vomiting, diarrhea, abdominal pain. Denies bright red blood per rectum. GENITOURINARY: Denies dysuria, hematuria. ENDOCRINE: Denies nocturia or thyroid problems. HEMATOLOGIC AND LYMPHATIC: Denies easy bruising or bleeding. SKIN: Denies rash or lesion. MUSCULOSKELETAL: Left hip pain with any kind of movements  NEUROLOGIC: Denies paralysis, paresthesias.  PSYCHIATRIC: Denies anxiety or depressive symptoms.   VITAL SIGNS:  Blood pressure (!) 142/65, pulse 76, temperature 98.3 F (36.8 C), temperature source Oral, resp. rate 20, height 5\' 7"  (1.702 m), weight  59.4 kg (131 lb), SpO2 98 %.  I/O:    Intake/Output Summary (Last 24 hours) at 03/29/17 1419 Last data filed at 03/29/17 1024  Gross per 24 hour  Intake              120 ml  Output                0 ml  Net              120 ml    PHYSICAL EXAMINATION:  GENERAL:  76 y.o.-year-old patient lying in the bed with no acute distress.  EYES: Pupils equal, round, reactive to light and accommodation. No scleral icterus. Extraocular muscles intact.  HEENT: Head atraumatic, normocephalic. Oropharynx and nasopharynx clear.  NECK:  Supple, no jugular venous distention. No thyroid enlargement, no tenderness.  LUNGS: Normal breath sounds bilaterally, no wheezing, rales,rhonchi or crepitation. No use of accessory muscles of respiration.  CARDIOVASCULAR: S1, S2 normal. No murmurs, rubs, or gallops.  ABDOMEN: Soft, non-tender, non-distended. Bowel sounds present. No organomegaly or mass.  EXTREMITIES: Left hip is tender and with clean honeycomb dressing status post open reduction and internal fixation NEUROLOGIC: Cranial nerves II through XII are intact. Sensation intact. Gait not checked.  PSYCHIATRIC: The patient is alert and oriented x 3.  SKIN: No obvious rash, lesion, or ulcer.   DATA REVIEW:   CBC  Recent Labs Lab 03/28/17 0611  WBC 9.9  HGB 11.6*  HCT 34.0*  PLT 176    Chemistries   Recent Labs Lab 03/28/17 0611  NA 139  K 4.5  CL 103  CO2 29  GLUCOSE 112*  BUN 23*  CREATININE 1.25*  CALCIUM 9.2    Cardiac Enzymes No results for input(s): TROPONINI in the last 168 hours.  Microbiology Results  Results for orders placed or performed during the hospital encounter of 03/25/17  MRSA PCR Screening     Status: None   Collection Time: 03/25/17  9:03 PM  Result Value Ref Range Status   MRSA by PCR NEGATIVE NEGATIVE Final    Comment:        The GeneXpert MRSA Assay (FDA approved for NASAL specimens only), is one component of a comprehensive MRSA colonization surveillance  program. It is not intended to diagnose MRSA infection nor to guide or monitor treatment for MRSA infections.     RADIOLOGY:  Dg Chest 1 View  Result Date: 03/25/2017 CLINICAL DATA:  Preop left hip fracture. EXAM: CHEST 1 VIEW COMPARISON:  07/02/2014 FINDINGS: The heart size and mediastinal contours are within normal limits. Aortic atherosclerosis at the arch. Lungs are hyperinflated and there is mild diffuse interstitial prominence which may  reflect age related interstitial change or mild fibrosis. Right basilar subpleural atelectasis and/or scarring. No pneumonic consolidation, effusion or pneumothorax. The visualized skeletal structures are unremarkable. IMPRESSION: Mild hyperinflation of the lungs. Mild diffuse interstitial prominence may reflect chronic interstitial lung disease or fibrosis. No pneumonia. Electronically Signed   By: Ashley Royalty M.D.   On: 03/25/2017 17:44   Ct Head Wo Contrast  Result Date: 03/25/2017 CLINICAL DATA:  Initial evaluation for acute trauma, fall. EXAM: CT HEAD WITHOUT CONTRAST CT CERVICAL SPINE WITHOUT CONTRAST TECHNIQUE: Multidetector CT imaging of the head and cervical spine was performed following the standard protocol without intravenous contrast. Multiplanar CT image reconstructions of the cervical spine were also generated. COMPARISON:  Prior CT from 07/03/2014. FINDINGS: CT HEAD FINDINGS Brain: Generalized cerebral atrophy with moderate chronic small vessel ischemic disease. No acute intracranial hemorrhage. No evidence for acute large vessel territory infarct. No mass lesion, midline shift or mass effect. No hydrocephalus. No extra-axial fluid collection. Vascular: No hyperdense vessel. Scattered vascular calcifications noted within the carotid siphons. Skull: Scalp soft tissues and calvarium within normal limits. Sinuses/Orbits: Globes and orbital soft tissues normal. Scattered mucosal thickening within the ethmoidal air cells. Paranasal sinuses otherwise  clear. No mastoid effusion. Other: None. CT CERVICAL SPINE FINDINGS Alignment: Straightening of the normal cervical lordosis. Grade 1 anterolisthesis of C3 on C4 and C4 on C5. Skull base and vertebrae: Skullbase intact. Normal C1-2 articulations are preserved in the dens is intact. Vertebral body heights maintained. No acute fracture. Soft tissues and spinal canal: Soft tissues of the neck demonstrate no acute abnormality. No prevertebral edema. Vascular calcifications about the carotid bifurcations. Disc levels: Moderate degenerative spondylolysis present at C5-6 and C6-7. Multilevel facet arthrosis noted, greatest within the upper cervical spine. Left C2-3 facets are fused. Upper chest: Visualized upper chest demonstrates no acute abnormality. Paraseptal emphysema noted at the lung apices. Other: None. IMPRESSION: CT BRAIN: 1. No acute intracranial process. 2. Generalized cerebral atrophy with moderate chronic small vessel ischemic disease. CT CERVICAL SPINE: 1. No acute traumatic injury within cervical spine. 2. Straightening of the normal cervical lordosis, which may be related to positioning and/or muscular spasm. 3. Grade 1 anterolisthesis C3 on C4 and C4 on C5, likely due to chronic facet degeneration. Moderate degenerative spondylolysis at C5-6 and C6-7. Electronically Signed   By: Jeannine Boga M.D.   On: 03/25/2017 18:38   Ct Cervical Spine Wo Contrast  Result Date: 03/25/2017 CLINICAL DATA:  Initial evaluation for acute trauma, fall. EXAM: CT HEAD WITHOUT CONTRAST CT CERVICAL SPINE WITHOUT CONTRAST TECHNIQUE: Multidetector CT imaging of the head and cervical spine was performed following the standard protocol without intravenous contrast. Multiplanar CT image reconstructions of the cervical spine were also generated. COMPARISON:  Prior CT from 07/03/2014. FINDINGS: CT HEAD FINDINGS Brain: Generalized cerebral atrophy with moderate chronic small vessel ischemic disease. No acute intracranial  hemorrhage. No evidence for acute large vessel territory infarct. No mass lesion, midline shift or mass effect. No hydrocephalus. No extra-axial fluid collection. Vascular: No hyperdense vessel. Scattered vascular calcifications noted within the carotid siphons. Skull: Scalp soft tissues and calvarium within normal limits. Sinuses/Orbits: Globes and orbital soft tissues normal. Scattered mucosal thickening within the ethmoidal air cells. Paranasal sinuses otherwise clear. No mastoid effusion. Other: None. CT CERVICAL SPINE FINDINGS Alignment: Straightening of the normal cervical lordosis. Grade 1 anterolisthesis of C3 on C4 and C4 on C5. Skull base and vertebrae: Skullbase intact. Normal C1-2 articulations are preserved in the dens is intact. Vertebral body  heights maintained. No acute fracture. Soft tissues and spinal canal: Soft tissues of the neck demonstrate no acute abnormality. No prevertebral edema. Vascular calcifications about the carotid bifurcations. Disc levels: Moderate degenerative spondylolysis present at C5-6 and C6-7. Multilevel facet arthrosis noted, greatest within the upper cervical spine. Left C2-3 facets are fused. Upper chest: Visualized upper chest demonstrates no acute abnormality. Paraseptal emphysema noted at the lung apices. Other: None. IMPRESSION: CT BRAIN: 1. No acute intracranial process. 2. Generalized cerebral atrophy with moderate chronic small vessel ischemic disease. CT CERVICAL SPINE: 1. No acute traumatic injury within cervical spine. 2. Straightening of the normal cervical lordosis, which may be related to positioning and/or muscular spasm. 3. Grade 1 anterolisthesis C3 on C4 and C4 on C5, likely due to chronic facet degeneration. Moderate degenerative spondylolysis at C5-6 and C6-7. Electronically Signed   By: Jeannine Boga M.D.   On: 03/25/2017 18:38   Dg Hip Operative Unilat With Pelvis Left  Result Date: 03/26/2017 CLINICAL DATA:  Hip fracture EXAM: OPERATIVE  left HIP (WITH PELVIS IF PERFORMED) 3 VIEWS TECHNIQUE: Fluoroscopic spot image(s) were submitted for interpretation post-operatively. COMPARISON:  03/25/2017 FINDINGS: Three low resolution intraoperative spot views of the left femur are submitted. The images demonstrate intramedullary rodding of the proximal left femur across an intertrochanteric fracture. IMPRESSION: Intraoperative fluoroscopic assistance provided during surgical fixation of proximal left femur fracture Electronically Signed   By: Donavan Foil M.D.   On: 03/26/2017 18:59   Dg Hip Unilat With Pelvis 2-3 Views Left  Result Date: 03/25/2017 CLINICAL DATA:  Pain after fall today. EXAM: DG HIP (WITH OR WITHOUT PELVIS) 2-3V LEFT COMPARISON:  None. FINDINGS: An acute, closed, varus angulated intertrochanteric fracture of the left femur is identified. No joint dislocations. The no acute displaced appearing pelvic fracture. 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 Degenerative spurring is noted off the acetabular roofs and both femoral heads. IMPRESSION: An acute, closed, varus angulated intertrochanteric fracture of the left femur is identified. Electronically Signed   By: Ashley Royalty M.D.   On: 03/25/2017 17:38    EKG:   Orders placed or performed during the hospital encounter of 03/25/17  . ED EKG  . ED EKG  . EKG 12-Lead  . EKG 12-Lead      Management plans discussed with the patient, family and they are in agreement.  CODE STATUS:     Code Status Orders        Start     Ordered   03/25/17 2100  Full code  Continuous     03/25/17 2059    Code Status History    Date Active Date Inactive Code Status Order ID Comments User Context   This patient has a current code status but no historical code status.    Advance Directive Documentation     Most Recent Value  Type of Advance Directive  Healthcare Power of Attorney, Living will  Pre-existing out of facility DNR order (yellow form or pink MOST form)   -  "MOST" Form in Place?  -      TOTAL TIME TAKING CARE OF THIS PATIENT: 43  minutes.   Note: This dictation was prepared with Dragon dictation along with smaller phrase technology. Any transcriptional errors that result from this process are unintentional.   @[facility] @  on 03/29/2017 at 2:19 PM  Between 7am to 6pm - Pager - [phone]  After 6pm go to [email or web address] - password Child psychotherapist Hospital[facility]  Office  [phone]  CC: Primary care physician; Roselee Nova, MD

## 2017-03-29 NOTE — Progress Notes (Signed)
Physical Therapy Treatment Patient Details Name: Christine Burnett MRN: [id number] DOB: 09-Aug-1940 Today's Date: 03/29/2017    History of Present Illness 76yo pt s/p L hip IM nailing on 10/22, due to fall and L intertrochanteric fracture. PMH of CKD, TIA, HTN, HLD, family history of CAD.    PT Comments    Pt is making progress towards goals. Pt performed bed mobility/transfers/amb with RW with min assist. Pt received in bed trying to get out of bed to the bathroom due to having BM in bed. Nursing present to change bed sheets. Pt amb to the bathroom and back, demonstrates increased step length and increased weight bearing through LLE. Pt requires cues for proper step sequence and amb slowly, however demonstrates carryover from yesterday. Pt's daughter was present to assist with toileting/cleaning tasks. Will continue to progress with strengthening and amb.    Follow Up Recommendations  SNF     Equipment Recommendations  None recommended by PT (pt has RW)    Recommendations for Other Services       Precautions / Restrictions Precautions Precautions: Fall Restrictions Weight Bearing Restrictions: Yes LLE Weight Bearing: Weight bearing as tolerated    Mobility  Bed Mobility Overal bed mobility: Needs Assistance Bed Mobility: Supine to Sit     Supine to sit: Min assist     General bed mobility comments: assist to guide LEs in/out of bed, utilized bed rails, raised height of bed  Transfers Overall transfer level: Needs assistance Equipment used: Rolling walker (2 wheeled) Transfers: Sit to/from Stand Sit to Stand: Min assist         General transfer comment: demonstrates carryover for proper hand placement, slow to rise/sit   Ambulation/Gait Ambulation/Gait assistance: Min assist Ambulation Distance (Feet): 50 Feet Assistive device: Rolling walker (2 wheeled) Gait Pattern/deviations: Step-to pattern     General Gait Details: demonstrates carryover with RW/step  sequencing, increased step length and ability to weight bear through LLE   Stairs            Wheelchair Mobility    Modified Rankin (Stroke Patients Only)       Balance Overall balance assessment: Needs assistance Sitting-balance support: Feet supported Sitting balance-Leahy Scale: Good Sitting balance - Comments: able to sit at EOB and maintain position to change gown   Standing balance support: Bilateral upper extremity supported Standing balance-Leahy Scale: Fair Standing balance comment: Pt able to stand at sink to wash hands, requires support from RW to maintain position for several minutes                             Cognition Arousal/Alertness: Awake/alert Behavior During Therapy: WFL for tasks assessed/performed Overall Cognitive Status: Within Functional Limits for tasks assessed                                        Exercises Other Exercises Other Exercises: Therex 20x, B ankle pumps, B quad sets, B glute sets, hip abd/add, LAQs, seated heel raises. Cues for proper technique. CGA on LLE to perform LAQs Other Exercises: Pt amb to bathroom for BM, able to stand with RW at sink to wash hands, assisted with toileting tasks, pt amb back to recliner    General Comments        Pertinent Vitals/Pain Pain Assessment: Faces Pain Score: 2  Faces Pain Scale: Hurts a little  bit Pain Location: L hip Pain Intervention(s): Limited activity within patient's tolerance;Monitored during session;Repositioned    Home Living                      Prior Function            PT Goals (current goals can now be found in the care plan section) Acute Rehab PT Goals Patient Stated Goal: to get L leg stronger PT Goal Formulation: With patient Time For Goal Achievement: 04/10/17 Potential to Achieve Goals: Good Progress towards PT goals: Progressing toward goals    Frequency    BID      PT Plan Current plan remains appropriate     Co-evaluation              AM-PAC PT "6 Clicks" Daily Activity  Outcome Measure  Difficulty turning over in bed (including adjusting bedclothes, sheets and blankets)?: None Difficulty moving from lying on back to sitting on the side of the bed? : Unable Difficulty sitting down on and standing up from a chair with arms (e.g., wheelchair, bedside commode, etc,.)?: Unable Help needed moving to and from a bed to chair (including a wheelchair)?: A Little Help needed walking in hospital room?: A Little Help needed climbing 3-5 steps with a railing? : A Lot 6 Click Score: 14    End of Session Equipment Utilized During Treatment: Gait belt Activity Tolerance: Patient tolerated treatment well Patient left: in chair;with call bell/phone within reach;with chair alarm set;with family/visitor present Nurse Communication: Mobility status PT Visit Diagnosis: Unsteadiness on feet (R26.81);Other abnormalities of gait and mobility (R26.89);Muscle weakness (generalized) (M62.81);Pain Pain - Right/Left: Right Pain - part of body: Hip     Time: 9024-0973 PT Time Calculation (min) (ACUTE ONLY): 24 min  Charges:                       G Codes:  Functional Assessment Tool Used: AM-PAC 6 Clicks Basic Mobility Functional Limitation: Mobility: Walking and moving around Mobility: Walking and Moving Around Current Status ([id number]): At least 40 percent but less than 60 percent impaired, limited or restricted Mobility: Walking and Moving Around Goal Status [phone]): At least 20 percent but less than 40 percent impaired, limited or restricted    Manfred Arch, SPT   Manfred Arch 03/29/2017, 11:47 AM

## 2017-03-29 NOTE — Progress Notes (Signed)
Physical Therapy Treatment Patient Details Name: Christine Burnett MRN: [id number] DOB: Jan 31, 1941 Today's Date: 03/29/2017    History of Present Illness 76yo pt s/p L hip IM nailing on 10/22, due to fall and L intertrochanteric fracture. PMH of CKD, TIA, HTN, HLD, family history of CAD.    PT Comments    Pt is making good progress towards goals. Pt performed there-ex, bed mobility/transfers/amb with RW with min assist. Pt amb to bathroom, assisted with toileting. Pt demonstrates improvements with proper technique/sequence when performing transfers/amb, continues to require assistance to get from seated EOB to supine. Able to progress amb distance from this morning, however appears to fatigue quickly with increased distance. Will continue to progress.   Follow Up Recommendations  SNF     Equipment Recommendations  None recommended by PT    Recommendations for Other Services       Precautions / Restrictions Precautions Precautions: Fall Restrictions Weight Bearing Restrictions: Yes LLE Weight Bearing: Weight bearing as tolerated    Mobility  Bed Mobility Overal bed mobility: Needs Assistance Bed Mobility: Supine to Sit     Supine to sit: Min assist Sit to supine: Min assist   General bed mobility comments: utilized bed rail, able to move LEs out of bed without assistance, needs guidance at trunk, raised height of bed.   Transfers Overall transfer level: Needs assistance Equipment used: Rolling walker (2 wheeled) Transfers: Sit to/from Stand Sit to Stand: Min assist         General transfer comment: pt able to remember proper hand and foot placement for transfers, slow to rise, able to lower with increased control  Ambulation/Gait Ambulation/Gait assistance: Min assist Ambulation Distance (Feet): 65 Feet Assistive device: Rolling walker (2 wheeled) Gait Pattern/deviations: Step-to pattern     General Gait Details: improved RW/step sequencing, increased step  length and ability to weight bear through LLE, requires one standing rest break, pt appeared to fatigue towards the end    Stairs            Wheelchair Mobility    Modified Rankin (Stroke Patients Only)       Balance Overall balance assessment: Needs assistance Sitting-balance support: Feet supported Sitting balance-Leahy Scale: Good Sitting balance - Comments: No assist needed   Standing balance support: Bilateral upper extremity supported Standing balance-Leahy Scale: Good Standing balance comment: Pt able to stand at sink to wash hands, requires support from RW to maintain position for several minutes                             Cognition Arousal/Alertness: Awake/alert Behavior During Therapy: WFL for tasks assessed/performed Overall Cognitive Status: Within Functional Limits for tasks assessed                                        Exercises Other Exercises Other Exercises: Therex 20x, B ankle pumps, quad sets, hip abd/add. Cues for proper technique Other Exercises: Pt amb to bathroom, able to stand with RW at sink to wash hands, assisted with toileting tasks, pt amb back to bed    General Comments        Pertinent Vitals/Pain Pain Assessment: Faces Pain Score: 2  Faces Pain Scale: Hurts a little bit Pain Location: L hip Pain Intervention(s): Limited activity within patient's tolerance;Monitored during session;Repositioned    Home Living  Prior Function            PT Goals (current goals can now be found in the care plan section) Acute Rehab PT Goals Patient Stated Goal: to get L leg stronger PT Goal Formulation: With patient Time For Goal Achievement: 04/10/17 Potential to Achieve Goals: Good Progress towards PT goals: Progressing toward goals    Frequency    BID      PT Plan Current plan remains appropriate    Co-evaluation              AM-PAC PT "6 Clicks" Daily Activity   Outcome Measure  Difficulty turning over in bed (including adjusting bedclothes, sheets and blankets)?: None Difficulty moving from lying on back to sitting on the side of the bed? : Unable Difficulty sitting down on and standing up from a chair with arms (e.g., wheelchair, bedside commode, etc,.)?: Unable Help needed moving to and from a bed to chair (including a wheelchair)?: A Little Help needed walking in hospital room?: A Little Help needed climbing 3-5 steps with a railing? : A Lot 6 Click Score: 14    End of Session Equipment Utilized During Treatment: Gait belt Activity Tolerance: Patient tolerated treatment well Patient left: in bed;with call bell/phone within reach;with bed alarm set;with family/visitor present   PT Visit Diagnosis: Other abnormalities of gait and mobility (R26.89);Muscle weakness (generalized) (M62.81);Pain Pain - Right/Left: Right Pain - part of body: Hip     Time: 5597-4163 PT Time Calculation (min) (ACUTE ONLY): 25 min  Charges:                       G Codes:  Functional Assessment Tool Used: AM-PAC 6 Clicks Basic Mobility Functional Limitation: Mobility: Walking and moving around Mobility: Walking and Moving Around Current Status (A4536): At least 40 percent but less than 60 percent impaired, limited or restricted Mobility: Walking and Moving Around Goal Status [phone]): At least 20 percent but less than 40 percent impaired, limited or restricted    Manfred Arch, SPT   Manfred Arch 03/29/2017, 3:37 PM

## 2017-03-29 NOTE — Discharge Instructions (Signed)
Diet: As you were doing prior to hospitalization   Dressing:  You may change your dressing as needed. Change the dressing with sterile gauze dressing.  Remove staples and apply steri strips on 04/10/17  Activity:  Increase activity slowly as tolerated, but follow the weight bearing instructions below.  Weight Bearing:   Weight bearing as tolerated to left lower extremity  To prevent constipation: you may use a stool softener such as -  Colace (over the counter) 100 mg by mouth twice a day  Drink plenty of fluids (prune juice may be helpful) and high fiber foods Miralax (over the counter) for constipation as needed.    Itching:  If you experience itching with your medications, try taking only a single pain pill, or even half a pain pill at a time.  You may take up to 10 pain pills per day, and you can also use benadryl over the counter for itching or also to help with sleep.   Precautions:  If you experience chest pain or shortness of breath - call 911 immediately for transfer to the hospital emergency department!!  If you develop a fever greater that 101 F, purulent drainage from wound, increased redness or drainage from wound, or calf pain-Call [place]                                               Follow- Up Appointment:  Please call for an appointment to be seen in 6 weeks at [facility] with primary care physician in 3-4 days  Repeat PT/INR on 03/31/2017 and further Coumadin management by primary care physician at the facility Follow-up with cardiology in 2 weeks Follow-up with orthopedics in 6 weeks and staple removal on 04/10/2017

## 2017-03-29 NOTE — Progress Notes (Signed)
   Subjective: 3 Days Post-Op Procedure(s) (LRB): INTRAMEDULLARY (IM) NAIL INTERTROCHANTRIC (Left) Patient reports pain as mild.   Patient is well, and has had no acute complaints or problems Denies any CP, SOB, ABD pain. We will continue therapy today.  .  Objective: Vital signs in last 24 hours: Temp:  [98.3 F (36.8 C)-98.9 F (37.2 C)] 98.3 F (36.8 C) (10/25 0731) Pulse Rate:  [74-76] 76 (10/25 0731) Resp:  [18-20] 20 (10/25 0731) BP: (119-148)/(52-65) 142/65 (10/25 0731) SpO2:  [92 %-98 %] 98 % (10/25 0731)  Intake/Output from previous day: 10/24 0701 - 10/25 0700 In: 240 [P.O.:240] Out: -  Intake/Output this shift: No intake/output data recorded.   Recent Labs  03/27/17 0448 03/28/17 0611  HGB 12.2 11.6*    Recent Labs  03/27/17 0448 03/28/17 0611  WBC 7.8 9.9  RBC 3.50* 3.37*  HCT 35.2 34.0*  PLT 143* 176    Recent Labs  03/27/17 0448 03/28/17 0611  NA 137 139  K 4.6 4.5  CL 106 103  CO2 26 29  BUN 17 23*  CREATININE 1.26* 1.25*  GLUCOSE 163* 112*  CALCIUM 8.9 9.2    Recent Labs  03/28/17 0611 03/29/17 0440  INR 1.14 1.19    EXAM General - Patient is Alert, Appropriate and Oriented Extremity - Neurovascular intact Sensation intact distally Intact pulses distally Dorsiflexion/Plantar flexion intact No cellulitis present Compartment soft Dressing - dressing C/D/I and no drainage Motor Function - intact, moving foot and toes well on exam.   Past Medical History:  Diagnosis Date  . A-fib ([place])   . Allergic rhinitis   . Anxiety   . Chronic bronchitis ([place])   . CKD (chronic kidney disease), stage III ([place])   . Cornea scar   . Diverticulosis   . Hyperlipidemia   . Hypertension   . Osteoporosis   . Risk for falls   . Situational disturbance   . TIA (transient ischemic attack)     Assessment/Plan:   3 Days Post-Op Procedure(s) (LRB): INTRAMEDULLARY (IM) NAIL INTERTROCHANTRIC (Left) Principal Problem:   Hip fracture,  unspecified laterality, closed, initial encounter ([place]) Active Problems:   Hip fracture ([place])  Estimated body mass index is 20.52 kg/m as calculated from the following:   Height as of this encounter: 5\' 7"  (1.702 m).   Weight as of this encounter: 59.4 kg (131 lb). Advance diet Up with therapy  INR subtherapeutic. Continue with lovenox and coumadin CM to assist with discharge to SNF today Follow up with [facility] in 6 weeks for recheck Staple removal and steristrip application 01/04/22  DVT Prophylaxis - Lovenox and Coumadin Weight-Bearing as tolerated to left leg   T. Rachelle Hora, PA-C [place] 03/29/2017, 8:10 AM

## 2017-03-29 NOTE — Progress Notes (Signed)
SUBJECTIVE: Patient is feeling much better   Vitals:   03/28/17 0736 03/28/17 1616 03/28/17 2028 03/29/17 0731  BP: 134/62 (!) 119/52 (!) 148/55 (!) 142/65  Pulse: 76 75 74 76  Resp: 18 18 19 20   Temp: 98.4 F (36.9 C) 98.6 F (37 C) 98.9 F (37.2 C) 98.3 F (36.8 C)  TempSrc: Oral Oral Oral Oral  SpO2: 93% 92% 95% 98%  Weight:      Height:        Intake/Output Summary (Last 24 hours) at 03/29/17 1137 Last data filed at 03/29/17 1024  Gross per 24 hour  Intake              120 ml  Output                0 ml  Net              120 ml    LABS: Basic Metabolic Panel:  Recent Labs  03/27/17 0448 03/28/17 0611  NA 137 139  K 4.6 4.5  CL 106 103  CO2 26 29  GLUCOSE 163* 112*  BUN 17 23*  CREATININE 1.26* 1.25*  CALCIUM 8.9 9.2   Liver Function Tests: No results for input(s): AST, ALT, ALKPHOS, BILITOT, PROT, ALBUMIN in the last 72 hours. No results for input(s): LIPASE, AMYLASE in the last 72 hours. CBC:  Recent Labs  03/27/17 0448 03/28/17 0611  WBC 7.8 9.9  HGB 12.2 11.6*  HCT 35.2 34.0*  MCV 100.5* 100.9*  PLT 143* 176   Cardiac Enzymes: No results for input(s): CKTOTAL, CKMB, CKMBINDEX, TROPONINI in the last 72 hours. BNP: Invalid input(s): POCBNP D-Dimer: No results for input(s): DDIMER in the last 72 hours. Hemoglobin A1C: No results for input(s): HGBA1C in the last 72 hours. Fasting Lipid Panel: No results for input(s): CHOL, HDL, LDLCALC, TRIG, CHOLHDL, LDLDIRECT in the last 72 hours. Thyroid Function Tests: No results for input(s): TSH, T4TOTAL, T3FREE, THYROIDAB in the last 72 hours.  Invalid input(s): FREET3 Anemia Panel: No results for input(s): VITAMINB12, FOLATE, FERRITIN, TIBC, IRON, RETICCTPCT in the last 72 hours.   PHYSICAL EXAM General: Well developed, well nourished, in no acute distress HEENT:  Normocephalic and atramatic Neck:  No JVD.  Lungs: Clear bilaterally to auscultation and percussion. Heart: HRRR . Normal S1 and  S2 without gallops or murmurs.  Abdomen: Bowel sounds are positive, abdomen soft and non-tender  Msk:  Back normal, normal gait. Normal strength and tone for age. Extremities: No clubbing, cyanosis or edema.   Neuro: Alert and oriented X 3. 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 Advise f/u 2 weeks and will out patient evaluation of CAD .Advise 2 weeks f/u.  Principal Problem:   Hip fracture, unspecified laterality, closed, initial encounter [facility]) Active Problems:   Hip fracture ([place])    Neoma Laming A, MD, [facility] 03/29/2017 11:37 AM

## 2017-03-30 ENCOUNTER — Other Ambulatory Visit
Admission: RE | Admit: 2017-03-30 | Discharge: 2017-03-30 | Disposition: A | Discharge status: Home / Self Care | Payer: No Typology Code available for payment source | Source: Ambulatory Visit | Attending: Internal Medicine | Admitting: Internal Medicine

## 2017-03-30 DIAGNOSIS — M8000XD Age-related osteoporosis with current pathological fracture, unspecified site, subsequent encounter for fracture with routine healing: Secondary | ICD-10-CM | POA: Insufficient documentation

## 2017-03-30 DIAGNOSIS — R42 Dizziness and giddiness: Secondary | ICD-10-CM | POA: Insufficient documentation

## 2017-03-30 DIAGNOSIS — I959 Hypotension, unspecified: Secondary | ICD-10-CM | POA: Insufficient documentation

## 2017-03-30 DIAGNOSIS — I480 Paroxysmal atrial fibrillation: Secondary | ICD-10-CM | POA: Diagnosis not present

## 2017-03-30 DIAGNOSIS — M810 Age-related osteoporosis without current pathological fracture: Secondary | ICD-10-CM | POA: Insufficient documentation

## 2017-03-30 DIAGNOSIS — N183 Chronic kidney disease, stage 3 (moderate): Secondary | ICD-10-CM | POA: Diagnosis not present

## 2017-03-30 LAB — CBC WITH DIFFERENTIAL/PLATELET
BASOS ABS: 0 10*3/uL (ref 0–0.1)
Basophils Relative: 1 %
EOS ABS: 0.2 10*3/uL (ref 0–0.7)
EOS PCT: 2 %
HEMATOCRIT: 35.9 % (ref 35.0–47.0)
HEMOGLOBIN: 12.3 g/dL (ref 12.0–16.0)
LYMPHS ABS: 2.1 10*3/uL (ref 1.0–3.6)
LYMPHS PCT: 22 %
MCH: 35 pg — AB (ref 26.0–34.0)
MCHC: 34.4 g/dL (ref 32.0–36.0)
MCV: 101.8 fL — ABNORMAL HIGH (ref 80.0–100.0)
MONO ABS: 0.6 10*3/uL (ref 0.2–0.9)
MONOS PCT: 6 %
NEUTROS ABS: 6.7 10*3/uL — AB (ref 1.4–6.5)
NEUTROS PCT: 69 %
Platelets: 239 10*3/uL (ref 150–440)
RBC: 3.52 MIL/uL — ABNORMAL LOW (ref 3.80–5.20)
RDW: 13.8 % (ref 11.5–14.5)
WBC: 9.7 10*3/uL (ref 3.6–11.0)

## 2017-03-30 LAB — COMPREHENSIVE METABOLIC PANEL
ALT: 18 U/L (ref 14–54)
ANION GAP: 11 (ref 5–15)
AST: 26 U/L (ref 15–41)
Albumin: 3.5 g/dL (ref 3.5–5.0)
Alkaline Phosphatase: 62 U/L (ref 38–126)
BUN: 29 mg/dL — ABNORMAL HIGH (ref 6–20)
CHLORIDE: 102 mmol/L (ref 101–111)
CO2: 27 mmol/L (ref 22–32)
Calcium: 9.7 mg/dL (ref 8.9–10.3)
Creatinine, Ser: 1.63 mg/dL — ABNORMAL HIGH (ref 0.44–1.00)
GFR calc non Af Amer: 29 mL/min — ABNORMAL LOW (ref >60)
GFR, EST AFRICAN AMERICAN: 34 mL/min — AB (ref >60)
Glucose, Bld: 150 mg/dL — ABNORMAL HIGH (ref 65–99)
Potassium: 4 mmol/L (ref 3.5–5.1)
SODIUM: 140 mmol/L (ref 135–145)
Total Bilirubin: 0.8 mg/dL (ref 0.3–1.2)
Total Protein: 7.6 g/dL (ref 6.5–8.1)

## 2017-03-30 LAB — PROTIME-INR
INR: 1.3
PROTHROMBIN TIME: 16.1 s — AB (ref 11.4–15.2)

## 2017-03-31 ENCOUNTER — Other Ambulatory Visit
Admission: RE | Admit: 2017-03-31 | Discharge: 2017-03-31 | Disposition: A | Discharge status: Home / Self Care | Payer: No Typology Code available for payment source | Source: Skilled Nursing Facility | Attending: Internal Medicine | Admitting: Internal Medicine

## 2017-03-31 DIAGNOSIS — I4891 Unspecified atrial fibrillation: Secondary | ICD-10-CM | POA: Insufficient documentation

## 2017-03-31 LAB — PROTIME-INR
INR: 1.39
Prothrombin Time: 16.9 seconds — ABNORMAL HIGH (ref 11.4–15.2)

## 2017-04-02 ENCOUNTER — Other Ambulatory Visit: Payer: Self-pay

## 2017-04-02 MED ORDER — HYDROCODONE-ACETAMINOPHEN 5-325 MG PO TABS: 1.0000 | ORAL_TABLET | Freq: Four times a day (QID) | ORAL | 0 refills | Status: DC | PRN

## 2017-04-02 NOTE — Telephone Encounter (Signed)
Rx sent to [facility] phone : [phone] , fax : [phone]  

## 2017-04-03 ENCOUNTER — Other Ambulatory Visit
Admission: RE | Admit: 2017-04-03 | Discharge: 2017-04-03 | Disposition: A | Discharge status: Home / Self Care | Payer: No Typology Code available for payment source | Source: Ambulatory Visit | Attending: Internal Medicine | Admitting: Internal Medicine

## 2017-04-03 DIAGNOSIS — I4891 Unspecified atrial fibrillation: Secondary | ICD-10-CM | POA: Insufficient documentation

## 2017-04-03 LAB — PROTIME-INR
INR: 2.58
Prothrombin Time: 27.5 seconds — ABNORMAL HIGH (ref 11.4–15.2)

## 2017-04-05 ENCOUNTER — Encounter
Admission: RE | Admit: 2017-04-05 | Discharge: 2017-04-05 | Disposition: A | Discharge status: Home / Self Care | Payer: Medicare Other | Source: Ambulatory Visit | Attending: Internal Medicine | Admitting: Internal Medicine

## 2017-04-06 ENCOUNTER — Other Ambulatory Visit
Admission: RE | Admit: 2017-04-06 | Discharge: 2017-04-06 | Disposition: A | Discharge status: Home / Self Care | Payer: Medicare Other | Source: Ambulatory Visit | Attending: Internal Medicine | Admitting: Internal Medicine

## 2017-04-06 DIAGNOSIS — I4891 Unspecified atrial fibrillation: Secondary | ICD-10-CM | POA: Insufficient documentation

## 2017-04-06 LAB — PROTIME-INR
INR: 4.48
Prothrombin Time: 42.3 seconds — ABNORMAL HIGH (ref 11.4–15.2)

## 2017-04-09 ENCOUNTER — Other Ambulatory Visit
Admission: RE | Admit: 2017-04-09 | Discharge: 2017-04-09 | Disposition: A | Discharge status: Home / Self Care | Payer: Medicare Other | Source: Ambulatory Visit | Attending: Gerontology | Admitting: Gerontology

## 2017-04-09 ENCOUNTER — Non-Acute Institutional Stay (SKILLED_NURSING_FACILITY): Payer: Medicare Other | Admitting: Gerontology

## 2017-04-09 ENCOUNTER — Encounter: Payer: Self-pay | Admitting: Gerontology

## 2017-04-09 DIAGNOSIS — Z5181 Encounter for therapeutic drug level monitoring: Secondary | ICD-10-CM | POA: Diagnosis not present

## 2017-04-09 DIAGNOSIS — S72002D Fracture of unspecified part of neck of left femur, subsequent encounter for closed fracture with routine healing: Secondary | ICD-10-CM

## 2017-04-09 DIAGNOSIS — I480 Paroxysmal atrial fibrillation: Secondary | ICD-10-CM | POA: Insufficient documentation

## 2017-04-09 LAB — PROTIME-INR
INR: 3.21
Prothrombin Time: 32.6 seconds — ABNORMAL HIGH (ref 11.4–15.2)

## 2017-04-09 NOTE — Progress Notes (Signed)
Location:   [facility] Room Number: [place] of Service:  SNF [phone]) Provider:  Toni Arthurs, NP-C  Roselee Nova, MD  Patient Care Team: Roselee Nova, MD as PCP - General (Family Medicine) Dagoberto Ligas, MD as Referring Physician (Internal Medicine)  Extended Emergency Contact Information Primary Emergency Contact: Wanda Plump Address: [address]          [place], [place] [address] Johnnette Litter of [place] Phone: [phone] Relation: Daughter  Code Status:  FULL Goals of care: Advanced Directive information Advanced Directives 04/09/2017  Does Patient Have a Medical Advance Directive? No  Type of Advance Directive -  Does patient want to make changes to medical advance directive? -  Copy of [place] in Chart? -  Would patient like information on creating a medical advance directive? -     Chief Complaint  Patient presents with  . Medical Management of Chronic Issues    Routine Visit    HPI:  Pt is a 76 y.o. female seen today for follow-up status post admission to [facility] for a left hip fracture with IM nailing.  Patient was admitted to the facility for rehab.  Patient has been participating in PT/OT.  Patient is progressing well.  Patient reports her pain is well controlled with current regimen.  Patient is also on chronic Coumadin for A. fib.  On 11/2, patient had a supratherapeutic INR level of 4.48.  Coumadin was held for 3 days, then INR was rechecked today.  Today, INR remains elevated at 3.21.  Coumadin will continue to be held with serial INR checks until levels are in the therapeutic range.  Patient denies any adverse effects.  Patient reports her appetite is good.  She is voiding well and having regular BMs.  Incision is well approximated.  No redness, warmth or drainage.  Staples intact.  Calves soft, supple.  Negative Homans sign.  Vital signs stable.  No other complaints.   Past Medical History:    Diagnosis Date  . A-fib ([place])   . Allergic rhinitis   . Anxiety   . Chronic bronchitis ([place])   . CKD (chronic kidney disease), stage III ([place])   . Cornea scar   . Depression    unspecified  . Diverticulosis   . Hyperlipidemia   . Hypertension   . Osteoporosis   . Risk for falls   . Situational disturbance   . TIA (transient ischemic attack)    Past Surgical History:  Procedure Laterality Date  . COLONOSCOPY  2016  . ESOPHAGUS SURGERY      Allergies  Allergen Reactions  . Cefuroxime Axetil     caused rash.  . Ciprofloxacin     Unknown  . Codeine     Allergies as of 04/09/2017      Reactions   Cefuroxime Axetil    caused rash.   Ciprofloxacin    Unknown   Codeine       Medication List        Accurate as of 04/09/17  9:24 AM. Always use your most recent med list.          acetaminophen 325 MG tablet Commonly known as:  TYLENOL Take 2 tablets (650 mg total) by mouth every 6 (six) hours as needed for mild pain, fever or headache (headache).   albuterol 108 (90 Base) MCG/ACT inhaler Commonly known as:  PROVENTIL HFA;VENTOLIN HFA Inhale 2 puffs into the lungs every 6 (six)  hours as needed for wheezing or shortness of breath.   ALPRAZolam 0.25 MG tablet Commonly known as:  XANAX Take 0.25 mg by mouth at bedtime as needed for anxiety.   alum & mag hydroxide-simeth 200-200-20 MG/5ML suspension Commonly known as:  MAALOX/MYLANTA Take 30 mLs by mouth every 4 (four) hours as needed for indigestion.   Cholecalciferol 4000 units Caps Take 1 capsule by mouth daily.   docusate sodium 100 MG capsule Commonly known as:  COLACE Take 1 capsule (100 mg total) by mouth 2 (two) times daily as needed for mild constipation.   enoxaparin 40 MG/0.4ML injection Commonly known as:  LOVENOX Inject 40 mg into the skin daily. Continue until INR therapeutic for DVT prophylaxis   HYDROcodone-acetaminophen 5-325 MG tablet Commonly known as:  NORCO/VICODIN Take 1-2 tablets by  mouth every 6 (six) hours as needed for moderate pain. Give 1 tab for pain level 1-4; give 2 tabs for pain level 2-10   ondansetron 4 MG tablet Commonly known as:  ZOFRAN Take 1 tablet (4 mg total) by mouth every 6 (six) hours as needed for nausea.   PARoxetine 30 MG tablet Commonly known as:  PAXIL Take 1 tablet (30 mg total) by mouth daily.   pravastatin 40 MG tablet Commonly known as:  PRAVACHOL Take 1 tablet (40 mg total) by mouth daily. Please put on file. Patient will call when finishing what she have.   warfarin 4 MG tablet Commonly known as:  COUMADIN Take 4 mg daily by mouth.       Review of Systems  Constitutional: Negative for activity change, appetite change, chills, diaphoresis and fever.  HENT: Negative for congestion, sneezing, sore throat, trouble swallowing and voice change.   Respiratory: Negative for apnea, cough, choking, chest tightness, shortness of breath and wheezing.   Cardiovascular: Negative for chest pain, palpitations and leg swelling.  Gastrointestinal: Negative for abdominal distention, abdominal pain, constipation, diarrhea and nausea.  Genitourinary: Negative for difficulty urinating, dysuria, frequency and urgency.  Musculoskeletal: Positive for arthralgias (typical arthritis). Negative for back pain, gait problem and myalgias.  Skin: Positive for wound. Negative for color change, pallor and rash.  Neurological: Negative for dizziness, tremors, syncope, speech difficulty, weakness, numbness and headaches.  Psychiatric/Behavioral: Negative for agitation and behavioral problems.  All other systems reviewed and are negative.   Immunization History  Administered Date(s) Administered  . Influenza Split 03/15/2010, 03/21/2011  . Influenza, High Dose Seasonal PF 04/07/2015, 03/22/2016, 03/13/2017  . Influenza,inj,Quad PF,6+ Mos 03/19/2013, 03/10/2014   Pertinent  Health Maintenance Due  Topic Date Due  . PNA vac Low Risk Adult (1 of 2 - PCV13)  12/03/2017 (Originally 02/24/2005)  . INFLUENZA VACCINE  Completed  . DEXA SCAN  Completed   Fall Risk  01/16/2017 12/25/2016 11/10/2016 08/10/2016 05/11/2016  Falls in the past year? No No No No No  Number falls in past yr: - - - - -  Injury with Fall? - - - - -   Functional Status Survey:    Vitals:   04/09/17 0857  BP: 95/69  Pulse: 86  Resp: 19  Temp: 98 F (36.7 C)  TempSrc: Oral  SpO2: 94%  Weight: 137 lb 9.6 oz (62.4 kg)  Height: 5\' 7"  (1.702 m)   Body mass index is 21.55 kg/m. Physical Exam  Constitutional: She is oriented to person, place, and time. Vital signs are normal. She appears well-developed and well-nourished. She is active and cooperative. She does not appear ill. No distress.  HENT:  Head: Normocephalic and atraumatic.  Mouth/Throat: Uvula is midline, oropharynx is clear and moist and mucous membranes are normal. Mucous membranes are not pale, not dry and not cyanotic.  Eyes: Conjunctivae, EOM and lids are normal. Pupils are equal, round, and reactive to light.  Neck: Trachea normal, normal range of motion and full passive range of motion without pain. Neck supple. No JVD present. No tracheal deviation, no edema and no erythema present. No thyromegaly present.  Cardiovascular: Normal rate, regular rhythm, normal heart sounds, intact distal pulses and normal pulses. Exam reveals no gallop, no distant heart sounds and no friction rub.  No murmur heard. Pulses:      Dorsalis pedis pulses are 2+ on the right side, and 2+ on the left side.  No edema  Pulmonary/Chest: Effort normal and breath sounds normal. No accessory muscle usage. No respiratory distress. She has no wheezes. She has no rhonchi. She has no rales. She exhibits no tenderness.  Abdominal: Soft. Normal appearance and bowel sounds are normal. She exhibits no distension and no ascites. There is no tenderness.  Musculoskeletal: She exhibits no edema or tenderness.       Left hip: She exhibits decreased range  of motion, decreased strength and laceration.  Expected osteoarthritis, stiffness; calves soft, supple.  Negative Homans sign  Neurological: She is alert and oriented to person, place, and time. She has normal strength.  Skin: Skin is warm and dry. Laceration noted. She is not diaphoretic. No cyanosis. No pallor. Nails show no clubbing.  Psychiatric: She has a normal mood and affect. Her speech is normal and behavior is normal. Judgment and thought content normal. Cognition and memory are normal.  Nursing note and vitals reviewed.   Labs reviewed: Recent Labs    03/27/17 0448 03/28/17 0611 03/30/17 1239  NA 137 139 140  K 4.6 4.5 4.0  CL 106 103 102  CO2 26 29 27   GLUCOSE 163* 112* 150*  BUN 17 23* 29*  CREATININE 1.26* 1.25* 1.63*  CALCIUM 8.9 9.2 9.7   Recent Labs    03/13/17 1547 03/30/17 1239  AST 16 26  ALT 10 18  ALKPHOS  --  62  BILITOT 0.5 0.8  PROT 7.3 7.6  ALBUMIN  --  3.5   Recent Labs    03/13/17 1547  03/27/17 0448 03/28/17 0611 03/30/17 1239  WBC 8.7   < > 7.8 9.9 9.7  NEUTROABS 4,916  --   --   --  6.7*  HGB 14.1   < > 12.2 11.6* 12.3  HCT 41.2   < > 35.2 34.0* 35.9  MCV 98.1   < > 100.5* 100.9* 101.8*  PLT 240   < > 143* 176 239   < > = values in this interval not displayed.   Lab Results  Component Value Date   TSH 1.690 01/15/2015   Lab Results  Component Value Date   HGBA1C 5.7 03/13/2017   Lab Results  Component Value Date   CHOL 161 11/10/2016   HDL 40 (L) 11/10/2016   LDLCALC 84 11/10/2016   TRIG 183 (H) 11/10/2016   CHOLHDL 4.0 11/10/2016    Significant Diagnostic Results in last 30 days:  Dg Chest 1 View  Result Date: 03/25/2017 CLINICAL DATA:  Preop left hip fracture. EXAM: CHEST 1 VIEW COMPARISON:  07/02/2014 FINDINGS: The heart size and mediastinal contours are within normal limits. Aortic atherosclerosis at the arch. Lungs are hyperinflated and there is mild diffuse interstitial prominence which may reflect  age related  interstitial change or mild fibrosis. Right basilar subpleural atelectasis and/or scarring. No pneumonic consolidation, effusion or pneumothorax. The visualized skeletal structures are unremarkable. IMPRESSION: Mild hyperinflation of the lungs. Mild diffuse interstitial prominence may reflect chronic interstitial lung disease or fibrosis. No pneumonia. Electronically Signed   By: Ashley Royalty M.D.   On: 03/25/2017 17:44   Ct Head Wo Contrast  Result Date: 03/25/2017 CLINICAL DATA:  Initial evaluation for acute trauma, fall. EXAM: CT HEAD WITHOUT CONTRAST CT CERVICAL SPINE WITHOUT CONTRAST TECHNIQUE: Multidetector CT imaging of the head and cervical spine was performed following the standard protocol without intravenous contrast. Multiplanar CT image reconstructions of the cervical spine were also generated. COMPARISON:  Prior CT from 07/03/2014. FINDINGS: CT HEAD FINDINGS Brain: Generalized cerebral atrophy with moderate chronic small vessel ischemic disease. No acute intracranial hemorrhage. No evidence for acute large vessel territory infarct. No mass lesion, midline shift or mass effect. No hydrocephalus. No extra-axial fluid collection. Vascular: No hyperdense vessel. Scattered vascular calcifications noted within the carotid siphons. Skull: Scalp soft tissues and calvarium within normal limits. Sinuses/Orbits: Globes and orbital soft tissues normal. Scattered mucosal thickening within the ethmoidal air cells. Paranasal sinuses otherwise clear. No mastoid effusion. Other: None. CT CERVICAL SPINE FINDINGS Alignment: Straightening of the normal cervical lordosis. Grade 1 anterolisthesis of C3 on C4 and C4 on C5. Skull base and vertebrae: Skullbase intact. Normal C1-2 articulations are preserved in the dens is intact. Vertebral body heights maintained. No acute fracture. Soft tissues and spinal canal: Soft tissues of the neck demonstrate no acute abnormality. No prevertebral edema. Vascular calcifications about  the carotid bifurcations. Disc levels: Moderate degenerative spondylolysis present at C5-6 and C6-7. Multilevel facet arthrosis noted, greatest within the upper cervical spine. Left C2-3 facets are fused. Upper chest: Visualized upper chest demonstrates no acute abnormality. Paraseptal emphysema noted at the lung apices. Other: None. IMPRESSION: CT BRAIN: 1. No acute intracranial process. 2. Generalized cerebral atrophy with moderate chronic small vessel ischemic disease. CT CERVICAL SPINE: 1. No acute traumatic injury within cervical spine. 2. Straightening of the normal cervical lordosis, which may be related to positioning and/or muscular spasm. 3. Grade 1 anterolisthesis C3 on C4 and C4 on C5, likely due to chronic facet degeneration. Moderate degenerative spondylolysis at C5-6 and C6-7. Electronically Signed   By: Jeannine Boga M.D.   On: 03/25/2017 18:38   Ct Cervical Spine Wo Contrast  Result Date: 03/25/2017 CLINICAL DATA:  Initial evaluation for acute trauma, fall. EXAM: CT HEAD WITHOUT CONTRAST CT CERVICAL SPINE WITHOUT CONTRAST TECHNIQUE: Multidetector CT imaging of the head and cervical spine was performed following the standard protocol without intravenous contrast. Multiplanar CT image reconstructions of the cervical spine were also generated. COMPARISON:  Prior CT from 07/03/2014. FINDINGS: CT HEAD FINDINGS Brain: Generalized cerebral atrophy with moderate chronic small vessel ischemic disease. No acute intracranial hemorrhage. No evidence for acute large vessel territory infarct. No mass lesion, midline shift or mass effect. No hydrocephalus. No extra-axial fluid collection. Vascular: No hyperdense vessel. Scattered vascular calcifications noted within the carotid siphons. Skull: Scalp soft tissues and calvarium within normal limits. Sinuses/Orbits: Globes and orbital soft tissues normal. Scattered mucosal thickening within the ethmoidal air cells. Paranasal sinuses otherwise clear. No  mastoid effusion. Other: None. CT CERVICAL SPINE FINDINGS Alignment: Straightening of the normal cervical lordosis. Grade 1 anterolisthesis of C3 on C4 and C4 on C5. Skull base and vertebrae: Skullbase intact. Normal C1-2 articulations are preserved in the dens is intact. Vertebral body  heights maintained. No acute fracture. Soft tissues and spinal canal: Soft tissues of the neck demonstrate no acute abnormality. No prevertebral edema. Vascular calcifications about the carotid bifurcations. Disc levels: Moderate degenerative spondylolysis present at C5-6 and C6-7. Multilevel facet arthrosis noted, greatest within the upper cervical spine. Left C2-3 facets are fused. Upper chest: Visualized upper chest demonstrates no acute abnormality. Paraseptal emphysema noted at the lung apices. Other: None. IMPRESSION: CT BRAIN: 1. No acute intracranial process. 2. Generalized cerebral atrophy with moderate chronic small vessel ischemic disease. CT CERVICAL SPINE: 1. No acute traumatic injury within cervical spine. 2. Straightening of the normal cervical lordosis, which may be related to positioning and/or muscular spasm. 3. Grade 1 anterolisthesis C3 on C4 and C4 on C5, likely due to chronic facet degeneration. Moderate degenerative spondylolysis at C5-6 and C6-7. Electronically Signed   By: Jeannine Boga M.D.   On: 03/25/2017 18:38   Dg Hip Operative Unilat With Pelvis Left  Result Date: 03/26/2017 CLINICAL DATA:  Hip fracture EXAM: OPERATIVE left HIP (WITH PELVIS IF PERFORMED) 3 VIEWS TECHNIQUE: Fluoroscopic spot image(s) were submitted for interpretation post-operatively. COMPARISON:  03/25/2017 FINDINGS: Three low resolution intraoperative spot views of the left femur are submitted. The images demonstrate intramedullary rodding of the proximal left femur across an intertrochanteric fracture. IMPRESSION: Intraoperative fluoroscopic assistance provided during surgical fixation of proximal left femur fracture  Electronically Signed   By: Donavan Foil M.D.   On: 03/26/2017 18:59   Dg Hip Unilat With Pelvis 2-3 Views Left  Result Date: 03/25/2017 CLINICAL DATA:  Pain after fall today. EXAM: DG HIP (WITH OR WITHOUT PELVIS) 2-3V LEFT COMPARISON:  None. FINDINGS: An acute, closed, varus angulated intertrochanteric fracture of the left femur is identified. No joint dislocations. The no acute displaced appearing pelvic fracture. 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 Degenerative spurring is noted off the acetabular roofs and both femoral heads. IMPRESSION: An acute, closed, varus angulated intertrochanteric fracture of the left femur is identified. Electronically Signed   By: Ashley Royalty M.D.   On: 03/25/2017 17:38    Assessment/Plan 1.  Closed fracture of left hip with routine healing, subsequent encounter  Continue PT/OT  Continue exercises as taught by PT/OT  Ice pack to the hip as needed for pain or swelling  Coumadin for DVT prophylaxis  Lovenox 40 mg subcu every 24 hours for DVT prophylaxis until Coumadin stable  Continue hydrocodone/APAP 5/325 mg 1-2 tablets p.o. every 6 hours as needed pain  Skin/incision care per protocol  Follow-up with orthopedist as instructed  2.  Encounter for therapeutic drug monitoring  Coumadin held  Recheck INR 1 day  Monitor for s/s of bleeding, bruising, hematuria, hematemesis, melena  Check INR 3 days after initiation of antibiotic, when applicable.   Family/ staff Communication:   Total Time:  Documentation:  Face to Face:  Family/Phone:   Labs/tests ordered: INR daily until stable  Medication list reviewed and assessed for continued appropriateness. Monthly medication orders reviewed and signed.  Vikki Ports, NP-C Geriatrics [facility] [phone], [place] [address] Cell Phone (Mon-Fri 8am-5pm):  [phone] On Call:  [phone] & follow prompts after 5pm &  weekends Office Phone:  [phone] Office Fax:  [phone]

## 2017-04-10 ENCOUNTER — Other Ambulatory Visit
Admission: RE | Admit: 2017-04-10 | Discharge: 2017-04-10 | Disposition: A | Discharge status: Home / Self Care | Payer: Medicare Other | Source: Ambulatory Visit | Attending: Gerontology | Admitting: Gerontology

## 2017-04-10 DIAGNOSIS — I480 Paroxysmal atrial fibrillation: Secondary | ICD-10-CM | POA: Insufficient documentation

## 2017-04-10 LAB — PROTIME-INR
INR: 3.31
PROTHROMBIN TIME: 33.4 s — AB (ref 11.4–15.2)

## 2017-04-11 ENCOUNTER — Other Ambulatory Visit
Admission: RE | Admit: 2017-04-11 | Discharge: 2017-04-11 | Disposition: A | Discharge status: Home / Self Care | Payer: Medicare Other | Source: Ambulatory Visit | Attending: Gerontology | Admitting: Gerontology

## 2017-04-11 DIAGNOSIS — I480 Paroxysmal atrial fibrillation: Secondary | ICD-10-CM | POA: Insufficient documentation

## 2017-04-11 LAB — PROTIME-INR
INR: 3.51
PROTHROMBIN TIME: 34.9 s — AB (ref 11.4–15.2)

## 2017-04-12 ENCOUNTER — Other Ambulatory Visit
Admission: RE | Admit: 2017-04-12 | Discharge: 2017-04-12 | Disposition: A | Discharge status: Home / Self Care | Payer: Medicare Other | Source: Ambulatory Visit | Attending: Gerontology | Admitting: Gerontology

## 2017-04-12 DIAGNOSIS — M84359S Stress fracture, hip, unspecified, sequela: Secondary | ICD-10-CM | POA: Diagnosis not present

## 2017-04-12 DIAGNOSIS — R42 Dizziness and giddiness: Secondary | ICD-10-CM | POA: Diagnosis not present

## 2017-04-12 DIAGNOSIS — I480 Paroxysmal atrial fibrillation: Secondary | ICD-10-CM | POA: Insufficient documentation

## 2017-04-12 LAB — PROTIME-INR
INR: 3.22
Prothrombin Time: 32.7 seconds — ABNORMAL HIGH (ref 11.4–15.2)

## 2017-04-16 ENCOUNTER — Encounter: Payer: Self-pay | Admitting: Gerontology

## 2017-04-16 ENCOUNTER — Other Ambulatory Visit
Admission: RE | Admit: 2017-04-16 | Discharge: 2017-04-16 | Disposition: A | Discharge status: Home / Self Care | Payer: Medicare Other | Source: Ambulatory Visit | Attending: Gerontology | Admitting: Gerontology

## 2017-04-16 ENCOUNTER — Non-Acute Institutional Stay (SKILLED_NURSING_FACILITY): Payer: Medicare Other | Admitting: Gerontology

## 2017-04-16 DIAGNOSIS — Z5181 Encounter for therapeutic drug level monitoring: Secondary | ICD-10-CM

## 2017-04-16 DIAGNOSIS — I480 Paroxysmal atrial fibrillation: Secondary | ICD-10-CM | POA: Insufficient documentation

## 2017-04-16 DIAGNOSIS — F17200 Nicotine dependence, unspecified, uncomplicated: Secondary | ICD-10-CM

## 2017-04-16 DIAGNOSIS — F17210 Nicotine dependence, cigarettes, uncomplicated: Secondary | ICD-10-CM

## 2017-04-16 DIAGNOSIS — S72002D Fracture of unspecified part of neck of left femur, subsequent encounter for closed fracture with routine healing: Secondary | ICD-10-CM

## 2017-04-16 LAB — PROTIME-INR
INR: 2.9
PROTHROMBIN TIME: 30.1 s — AB (ref 11.4–15.2)

## 2017-04-16 NOTE — Progress Notes (Signed)
Location:   [facility] Room Number: [place] of Service:  SNF [phone])  Provider: Toni Arthurs, NP-C  PCP: Roselee Nova, MD Patient Care Team: Roselee Nova, MD as PCP - General (Family Medicine) Dagoberto Ligas, MD as Referring Physician (Internal Medicine)  Extended Emergency Contact Information Primary Emergency Contact: Wanda Plump Address: [address]          [place],  [address] Johnnette Litter of [place] Phone: [phone] Relation: Daughter  Code Status: FULL Goals of care:  Advanced Directive information Advanced Directives 04/09/2017  Does Patient Have a Medical Advance Directive? No  Type of Advance Directive -  Does patient want to make changes to medical advance directive? -  Copy of [place] in Chart? -  Would patient like information on creating a medical advance directive? -     Allergies  Allergen Reactions  . Cefuroxime Axetil     caused rash.  . Ciprofloxacin     Unknown  . Codeine     Chief Complaint  Patient presents with  . Discharge Note    Discharged from SNF    HPI:  76 y.o. female seen today for discharge evaluation.  Patient was admitted to the facility for rehab status post [facility] to [facility] for left hip fracture with IM nailing.  Patient has been participating in PT/OT.  Patient has been progressing well.  Patient reports her pain is well controlled with current regimen.  Patient is on chronic Coumadin for A. fib.  INRs have been supratherapeutic, and slow to stabilize.  After long discussion with patient, patient stated she was a heavy smoker (1.5-2 ppd) prior to admission, but has not had a cigarette in over 3 weeks.  This could likely account for the lability of the INR.  Patient and I discussed methods available for smoking cessation.  Patient reports she plans to not smoke again, but states she realizes it will be difficult since she lives with 2 of her daughters that smoke.   She has come up with a plan for ongoing cessation despite this.  We also discussed need for very close monitoring of her Coumadin levels due to the lability of the levels while here and her change in lifestyle.  Patient verbalizes understanding.  However, she states she is wanting to change PCPs.  We discussed a plan to have the home health agency continue to monitor her levels and adjust accordingly until she is able to find a new PCP to take over management.  Patient verbalizes understanding.  Patient and I discussed alternatives to smoking and cessation aids, such as nicotine patch, lozenges, gum as well as nonmedicinal aids such as peppermint, hard candy/suckers, and behavioral modifications/routine changes.  Patient verbalizes she wants to continue to not smoke.  She states this is her only concern about going home.  Otherwise, patient reports she is feeling well and ready to discharge.  Patient is eating well, voiding well and having regular BMs.  Incision is well approximated.  No redness, warmth or drainage.  Calves soft, supple.  Negative Homans sign.  Vital signs stable.  No other complaints.    Past Medical History:  Diagnosis Date  . A-fib ([place])   . Allergic rhinitis   . Anxiety   . Chronic bronchitis ([place])   . CKD (chronic kidney disease), stage III ([place])   . Cornea scar   . Depression    unspecified  . Diverticulosis   .  Hyperlipidemia   . Hypertension   . Osteoporosis   . Risk for falls   . Situational disturbance   . TIA (transient ischemic attack)     Past Surgical History:  Procedure Laterality Date  . COLONOSCOPY  2016  . ESOPHAGUS SURGERY        reports that she has been smoking cigarettes.  She has a 15.25 pack-year smoking history. she has never used smokeless tobacco. She reports that she does not drink alcohol or use drugs. Social History   Socioeconomic History  . Marital status: Divorced    Spouse name: Not on file  . Number of children: 4  . Years of  education: 96  . Highest education level: High school graduate  Social Needs  . Financial resource strain: Not on file  . Food insecurity - worry: Not on file  . Food insecurity - inability: Not on file  . Transportation needs - medical: Not on file  . Transportation needs - non-medical: Not on file  Occupational History  . Not on file  Tobacco Use  . Smoking status: Current Every Day Smoker    Packs/day: 0.25    Years: 61.00    Pack years: 15.25    Types: Cigarettes  . Smokeless tobacco: Never Used  Substance and Sexual Activity  . Alcohol use: No  . Drug use: No  . Sexual activity: No  Other Topics Concern  . Not on file  Social History Narrative   Full Code   Current smoker   4 children   Denies alcohol use   Functional Status Survey:    Allergies  Allergen Reactions  . Cefuroxime Axetil     caused rash.  . Ciprofloxacin     Unknown  . Codeine     Pertinent  Health Maintenance Due  Topic Date Due  . PNA vac Low Risk Adult (1 of 2 - PCV13) 12/03/2017 (Originally 02/24/2005)  . INFLUENZA VACCINE  Completed  . DEXA SCAN  Completed    Medications: Allergies as of 04/16/2017      Reactions   Cefuroxime Axetil    caused rash.   Ciprofloxacin    Unknown   Codeine       Medication List        Accurate as of 04/16/17  9:59 AM. Always use your most recent med list.          acetaminophen 325 MG tablet Commonly known as:  TYLENOL Take 2 tablets (650 mg total) by mouth every 6 (six) hours as needed for mild pain, fever or headache (headache).   albuterol 108 (90 Base) MCG/ACT inhaler Commonly known as:  PROVENTIL HFA;VENTOLIN HFA Inhale 2 puffs into the lungs every 6 (six) hours as needed for wheezing or shortness of breath.   ALPRAZolam 0.25 MG tablet Commonly known as:  XANAX Take 0.25 mg by mouth at bedtime as needed for anxiety.   alum & mag hydroxide-simeth 200-200-20 MG/5ML suspension Commonly known as:  MAALOX/MYLANTA Take 30 mLs by mouth  every 4 (four) hours as needed for indigestion.   Cholecalciferol 4000 units Caps Take 1 capsule by mouth daily.   docusate sodium 100 MG capsule Commonly known as:  COLACE Take 1 capsule (100 mg total) by mouth 2 (two) times daily as needed for mild constipation.   enoxaparin 40 MG/0.4ML injection Commonly known as:  LOVENOX Inject 40 mg into the skin daily. Continue until INR therapeutic for DVT prophylaxis   HYDROcodone-acetaminophen 5-325 MG tablet Commonly known  as:  NORCO/VICODIN Take 1-2 tablets by mouth every 6 (six) hours as needed for moderate pain. Give 1 tab for pain level 1-4; give 2 tabs for pain level 2-10   ondansetron 4 MG tablet Commonly known as:  ZOFRAN Take 1 tablet (4 mg total) by mouth every 6 (six) hours as needed for nausea.   PARoxetine 30 MG tablet Commonly known as:  PAXIL Take 1 tablet (30 mg total) by mouth daily.   pravastatin 40 MG tablet Commonly known as:  PRAVACHOL Take 1 tablet (40 mg total) by mouth daily. Please put on file. Patient will call when finishing what she have.   warfarin 4 MG tablet Commonly known as:  COUMADIN Take as directed by the anticoagulation clinic. If you are unsure how to take this medication, talk to your nurse or doctor. Original instructions:  Take 4 mg daily by mouth.       Review of Systems  Constitutional: Negative for activity change, appetite change, chills, diaphoresis and fever.  HENT: Negative for congestion, sneezing, sore throat, trouble swallowing and voice change.   Eyes: Negative for redness.  Respiratory: Negative for apnea, cough, choking, chest tightness, shortness of breath and wheezing.   Cardiovascular: Negative for chest pain, palpitations and leg swelling.  Gastrointestinal: Negative for abdominal distention, abdominal pain, constipation, diarrhea and nausea.  Genitourinary: Negative for difficulty urinating, dysuria, frequency and urgency.  Musculoskeletal: Positive for arthralgias  (typical arthritis). Negative for back pain, gait problem and myalgias.  Skin: Positive for wound. Negative for color change, pallor and rash.  Neurological: Negative for dizziness, tremors, syncope, speech difficulty, weakness, numbness and headaches.  Psychiatric/Behavioral: Negative for agitation and behavioral problems.  All other systems reviewed and are negative.   Vitals:   04/16/17 0946  BP: 94/66  Pulse: 90  Resp: 20  Temp: 97.8 F (36.6 C)  TempSrc: Oral  SpO2: 95%  Weight: 138 lb 1.6 oz (62.6 kg)  Height: 5\' 7"  (1.702 m)   Body mass index is 21.63 kg/m. Physical Exam  Constitutional: She is oriented to person, place, and time. Vital signs are normal. She appears well-developed and well-nourished. She is active and cooperative. She does not appear ill. No distress.  HENT:  Head: Normocephalic and atraumatic.  Mouth/Throat: Uvula is midline, oropharynx is clear and moist and mucous membranes are normal. Mucous membranes are not pale, not dry and not cyanotic.  Eyes: Conjunctivae, EOM and lids are normal. Pupils are equal, round, and reactive to light.  Neck: Trachea normal, normal range of motion and full passive range of motion without pain. Neck supple. No JVD present. No tracheal deviation, no edema and no erythema present. No thyromegaly present.  Cardiovascular: Normal rate, regular rhythm, normal heart sounds, intact distal pulses and normal pulses. Exam reveals no gallop, no distant heart sounds and no friction rub.  No murmur heard. Pulses:      Dorsalis pedis pulses are 2+ on the right side, and 2+ on the left side.  No edema  Pulmonary/Chest: Effort normal and breath sounds normal. No accessory muscle usage. No respiratory distress. She has no decreased breath sounds. She has no wheezes. She has no rhonchi. She has no rales. She exhibits no tenderness.  Abdominal: Soft. Normal appearance and bowel sounds are normal. She exhibits no distension and no ascites. There  is no tenderness.  Musculoskeletal: She exhibits no edema or tenderness.       Left hip: She exhibits decreased range of motion, decreased strength and laceration.  Expected osteoarthritis, stiffness; calves soft, supple.  Negative Homans sign  Neurological: She is alert and oriented to person, place, and time. She has normal strength.  Skin: Skin is warm and dry. Laceration noted. She is not diaphoretic. No cyanosis. No pallor. Nails show no clubbing.  Psychiatric: She has a normal mood and affect. Her speech is normal and behavior is normal. Judgment and thought content normal. Cognition and memory are normal.  Nursing note and vitals reviewed.   Labs reviewed: Basic Metabolic Panel: Recent Labs    03/27/17 0448 03/28/17 0611 03/30/17 1239  NA 137 139 140  K 4.6 4.5 4.0  CL 106 103 102  CO2 26 29 27   GLUCOSE 163* 112* 150*  BUN 17 23* 29*  CREATININE 1.26* 1.25* 1.63*  CALCIUM 8.9 9.2 9.7   Liver Function Tests: Recent Labs    03/13/17 1547 03/30/17 1239  AST 16 26  ALT 10 18  ALKPHOS  --  62  BILITOT 0.5 0.8  PROT 7.3 7.6  ALBUMIN  --  3.5   No results for input(s): LIPASE, AMYLASE in the last 8760 hours. No results for input(s): AMMONIA in the last 8760 hours. CBC: Recent Labs    03/13/17 1547  03/27/17 0448 03/28/17 0611 03/30/17 1239  WBC 8.7   < > 7.8 9.9 9.7  NEUTROABS 4,916  --   --   --  6.7*  HGB 14.1   < > 12.2 11.6* 12.3  HCT 41.2   < > 35.2 34.0* 35.9  MCV 98.1   < > 100.5* 100.9* 101.8*  PLT 240   < > 143* 176 239   < > = values in this interval not displayed.   Cardiac Enzymes: No results for input(s): CKTOTAL, CKMB, CKMBINDEX, TROPONINI in the last 8760 hours. BNP: Invalid input(s): POCBNP CBG: No results for input(s): GLUCAP in the last 8760 hours.  Procedures and Imaging Studies During Stay: Dg Chest 1 View  Result Date: 03/25/2017 CLINICAL DATA:  Preop left hip fracture. EXAM: CHEST 1 VIEW COMPARISON:  07/02/2014 FINDINGS: The  heart size and mediastinal contours are within normal limits. Aortic atherosclerosis at the arch. Lungs are hyperinflated and there is mild diffuse interstitial prominence which may reflect age related interstitial change or mild fibrosis. Right basilar subpleural atelectasis and/or scarring. No pneumonic consolidation, effusion or pneumothorax. The visualized skeletal structures are unremarkable. IMPRESSION: Mild hyperinflation of the lungs. Mild diffuse interstitial prominence may reflect chronic interstitial lung disease or fibrosis. No pneumonia. Electronically Signed   By: Ashley Royalty M.D.   On: 03/25/2017 17:44   Ct Head Wo Contrast  Result Date: 03/25/2017 CLINICAL DATA:  Initial evaluation for acute trauma, fall. EXAM: CT HEAD WITHOUT CONTRAST CT CERVICAL SPINE WITHOUT CONTRAST TECHNIQUE: Multidetector CT imaging of the head and cervical spine was performed following the standard protocol without intravenous contrast. Multiplanar CT image reconstructions of the cervical spine were also generated. COMPARISON:  Prior CT from 07/03/2014. FINDINGS: CT HEAD FINDINGS Brain: Generalized cerebral atrophy with moderate chronic small vessel ischemic disease. No acute intracranial hemorrhage. No evidence for acute large vessel territory infarct. No mass lesion, midline shift or mass effect. No hydrocephalus. No extra-axial fluid collection. Vascular: No hyperdense vessel. Scattered vascular calcifications noted within the carotid siphons. Skull: Scalp soft tissues and calvarium within normal limits. Sinuses/Orbits: Globes and orbital soft tissues normal. Scattered mucosal thickening within the ethmoidal air cells. Paranasal sinuses otherwise clear. No mastoid effusion. Other: None. CT CERVICAL SPINE FINDINGS Alignment: Straightening of  the normal cervical lordosis. Grade 1 anterolisthesis of C3 on C4 and C4 on C5. Skull base and vertebrae: Skullbase intact. Normal C1-2 articulations are preserved in the dens is  intact. Vertebral body heights maintained. No acute fracture. Soft tissues and spinal canal: Soft tissues of the neck demonstrate no acute abnormality. No prevertebral edema. Vascular calcifications about the carotid bifurcations. Disc levels: Moderate degenerative spondylolysis present at C5-6 and C6-7. Multilevel facet arthrosis noted, greatest within the upper cervical spine. Left C2-3 facets are fused. Upper chest: Visualized upper chest demonstrates no acute abnormality. Paraseptal emphysema noted at the lung apices. Other: None. IMPRESSION: CT BRAIN: 1. No acute intracranial process. 2. Generalized cerebral atrophy with moderate chronic small vessel ischemic disease. CT CERVICAL SPINE: 1. No acute traumatic injury within cervical spine. 2. Straightening of the normal cervical lordosis, which may be related to positioning and/or muscular spasm. 3. Grade 1 anterolisthesis C3 on C4 and C4 on C5, likely due to chronic facet degeneration. Moderate degenerative spondylolysis at C5-6 and C6-7. Electronically Signed   By: Jeannine Boga M.D.   On: 03/25/2017 18:38   Ct Cervical Spine Wo Contrast  Result Date: 03/25/2017 CLINICAL DATA:  Initial evaluation for acute trauma, fall. EXAM: CT HEAD WITHOUT CONTRAST CT CERVICAL SPINE WITHOUT CONTRAST TECHNIQUE: Multidetector CT imaging of the head and cervical spine was performed following the standard protocol without intravenous contrast. Multiplanar CT image reconstructions of the cervical spine were also generated. COMPARISON:  Prior CT from 07/03/2014. FINDINGS: CT HEAD FINDINGS Brain: Generalized cerebral atrophy with moderate chronic small vessel ischemic disease. No acute intracranial hemorrhage. No evidence for acute large vessel territory infarct. No mass lesion, midline shift or mass effect. No hydrocephalus. No extra-axial fluid collection. Vascular: No hyperdense vessel. Scattered vascular calcifications noted within the carotid siphons. Skull: Scalp  soft tissues and calvarium within normal limits. Sinuses/Orbits: Globes and orbital soft tissues normal. Scattered mucosal thickening within the ethmoidal air cells. Paranasal sinuses otherwise clear. No mastoid effusion. Other: None. CT CERVICAL SPINE FINDINGS Alignment: Straightening of the normal cervical lordosis. Grade 1 anterolisthesis of C3 on C4 and C4 on C5. Skull base and vertebrae: Skullbase intact. Normal C1-2 articulations are preserved in the dens is intact. Vertebral body heights maintained. No acute fracture. Soft tissues and spinal canal: Soft tissues of the neck demonstrate no acute abnormality. No prevertebral edema. Vascular calcifications about the carotid bifurcations. Disc levels: Moderate degenerative spondylolysis present at C5-6 and C6-7. Multilevel facet arthrosis noted, greatest within the upper cervical spine. Left C2-3 facets are fused. Upper chest: Visualized upper chest demonstrates no acute abnormality. Paraseptal emphysema noted at the lung apices. Other: None. IMPRESSION: CT BRAIN: 1. No acute intracranial process. 2. Generalized cerebral atrophy with moderate chronic small vessel ischemic disease. CT CERVICAL SPINE: 1. No acute traumatic injury within cervical spine. 2. Straightening of the normal cervical lordosis, which may be related to positioning and/or muscular spasm. 3. Grade 1 anterolisthesis C3 on C4 and C4 on C5, likely due to chronic facet degeneration. Moderate degenerative spondylolysis at C5-6 and C6-7. Electronically Signed   By: Jeannine Boga M.D.   On: 03/25/2017 18:38   Dg Hip Operative Unilat With Pelvis Left  Result Date: 03/26/2017 CLINICAL DATA:  Hip fracture EXAM: OPERATIVE left HIP (WITH PELVIS IF PERFORMED) 3 VIEWS TECHNIQUE: Fluoroscopic spot image(s) were submitted for interpretation post-operatively. COMPARISON:  03/25/2017 FINDINGS: Three low resolution intraoperative spot views of the left femur are submitted. The images demonstrate  intramedullary rodding of the proximal left  femur across an intertrochanteric fracture. IMPRESSION: Intraoperative fluoroscopic assistance provided during surgical fixation of proximal left femur fracture Electronically Signed   By: Donavan Foil M.D.   On: 03/26/2017 18:59   Dg Hip Unilat With Pelvis 2-3 Views Left  Result Date: 03/25/2017 CLINICAL DATA:  Pain after fall today. EXAM: DG HIP (WITH OR WITHOUT PELVIS) 2-3V LEFT COMPARISON:  None. FINDINGS: An acute, closed, varus angulated intertrochanteric fracture of the left femur is identified. No joint dislocations. The no acute displaced appearing pelvic fracture. 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 Degenerative spurring is noted off the acetabular roofs and both femoral heads. IMPRESSION: An acute, closed, varus angulated intertrochanteric fracture of the left femur is identified. Electronically Signed   By: Ashley Royalty M.D.   On: 03/25/2017 17:38    Assessment/Plan:   1.  Closed fracture of left hip with routine healing, subsequent encounter  Continue PT/OT  Continue exercises taught by PT/OT  Ice pack to hip as needed for pain or swelling  Coumadin for DVT prophylaxis  Lovenox has been DC'd  Continue hydrocodone/APAP 5/325 mg 1-2 tablets p.o. every 6 hours as needed pain #30, no refill  Skin/incision care per protocol  Follow-up with orthopedist as instructed for continuity of care  2.  Encounter for therapeutic drug monitoring  Coumadin 2 mg p.o. daily  Rx to go home written as 1 mg tablets for ease of dose adjustment  Home health RN to check INR within 2 days  Home health pharmacist to adjust Coumadin until patient gets a new PCP  Monitor for S/S of bleeding, bruising, hematuria, hematemesis, melena  Obtain new and follow-up with PCP ASAP for continuity of care and ongoing INR and drug maintenance  3.  Cigarette smoker motivated to quit  Educated on smoking cessation counseling  Patient  verbalized plan for routine changes  Educated patient on cessation aids  Educated patient benefits of quitting and risks of continuing smoking   Patient is being discharged with the following home health services: Home health PT/OT/RN through [facility]  Patient is being discharged with the following durable medical equipment: Bedside commode  Patient has been advised to f/u with their PCP in 1-2 weeks to bring them up to date on their rehab stay.  Social services at facility was responsible for arranging this appointment.  Pt was provided with a 30 day supply of prescriptions for medications and refills must be obtained from their PCP.  For controlled substances, a more limited supply may be provided adequate until PCP appointment only.  Future labs/tests needed: INR in 2 days via home health agency  Family/ staff Communication:   Total Time:  Documentation:  Face to Face:  Family/Phone:  Vikki Ports, NP-C [facility] [address], [place] [address] Cell Phone (Mon-Fri 8am-5pm):  [phone] On Call:  [phone] & follow prompts after 5pm & weekends Office Phone:  [phone] Office Fax:  [phone]

## 2017-04-17 ENCOUNTER — Other Ambulatory Visit
Admission: RE | Admit: 2017-04-17 | Discharge: 2017-04-17 | Disposition: A | Discharge status: Home / Self Care | Payer: Medicare Other | Source: Ambulatory Visit | Attending: Gerontology | Admitting: Gerontology

## 2017-04-17 DIAGNOSIS — I480 Paroxysmal atrial fibrillation: Secondary | ICD-10-CM | POA: Insufficient documentation

## 2017-04-17 LAB — PROTIME-INR
INR: 3.58
Prothrombin Time: 35.5 seconds — ABNORMAL HIGH (ref 11.4–15.2)

## 2017-04-19 ENCOUNTER — Telehealth: Payer: Self-pay

## 2017-04-19 ENCOUNTER — Ambulatory Visit: Payer: Medicare Other | Admitting: Family Medicine

## 2017-04-19 ENCOUNTER — Other Ambulatory Visit: Payer: Self-pay | Admitting: Family Medicine

## 2017-04-19 ENCOUNTER — Telehealth: Payer: Self-pay | Admitting: Family Medicine

## 2017-04-19 DIAGNOSIS — N183 Chronic kidney disease, stage 3 (moderate): Secondary | ICD-10-CM | POA: Diagnosis not present

## 2017-04-19 DIAGNOSIS — I129 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 stage 4 chronic kidney disease, or unspecified chronic kidney disease: Secondary | ICD-10-CM | POA: Diagnosis not present

## 2017-04-19 DIAGNOSIS — J42 Unspecified chronic bronchitis: Secondary | ICD-10-CM | POA: Diagnosis not present

## 2017-04-19 DIAGNOSIS — M80052D Age-related osteoporosis with current pathological fracture, left femur, subsequent encounter for fracture with routine healing: Secondary | ICD-10-CM | POA: Diagnosis not present

## 2017-04-19 DIAGNOSIS — I480 Paroxysmal atrial fibrillation: Secondary | ICD-10-CM | POA: Diagnosis not present

## 2017-04-19 DIAGNOSIS — F419 Anxiety disorder, unspecified: Secondary | ICD-10-CM

## 2017-04-19 DIAGNOSIS — I6529 Occlusion and stenosis of unspecified carotid artery: Secondary | ICD-10-CM | POA: Diagnosis not present

## 2017-04-19 NOTE — Telephone Encounter (Signed)
Copied from [place] [id number]. Topic: General - Other >> Apr 19, 2017 11:42 AM Yvette Rack wrote: Reason for CRM: Merry Proud from [facility] [phone] called to let Dr Manuella Ghazi know that pt PTI 36.5 and INR 3.0 results he also states that pt hasn't took medicine 6 days her dosage is Coumadin 4 mg pt would like to start doing her coumadin lab draws at home because its easier for her

## 2017-04-19 NOTE — Telephone Encounter (Signed)
Patient's INR is supratherapeutic, she was admitted to the hospital in October and upon discharge, her dosage of Coumadin was increased from 2 mg daily to 4 mg daily, she should hold off on Coumadin for 3-4 days and be seen on Monday, November 19 for repeat INR check.

## 2017-04-19 NOTE — Telephone Encounter (Signed)
Copied from [place] [id number]. Topic: General - Other >> Apr 19, 2017 11:42 AM Yvette Rack wrote: Reason for CRM: Merry Proud from [facility] [phone] called to let Dr Manuella Ghazi know that pt PTI 36.5 and INR 3.0 results he also states that pt hasn't took medicine 6 days her dosage is Coumadin 4 mg pt would like to start doing her coumadin lab draws at home because its easier for her   >> Apr 19, 2017 11:49 AM Yvette Rack wrote: [facility] called about Pt lab results

## 2017-04-20 NOTE — Telephone Encounter (Signed)
Spoke to the pt and she agreed to schedule an apt with DR shah. She just had hip surgery and she stated her blood was to thin anf they had to thick it up in order to do the surgery. Pt has been off her warfarin for 5 days. Pt states she has a nurse to help her with her recovery that checks her PT/INR. Dr. Manuella Ghazi is out Monday and Tuesday of next week and his wednesday is full therefore schedule an apt on 05/02/2017 @3 :40pm.

## 2017-04-22 DIAGNOSIS — J42 Unspecified chronic bronchitis: Secondary | ICD-10-CM | POA: Diagnosis not present

## 2017-04-22 DIAGNOSIS — M80052D Age-related osteoporosis with current pathological fracture, left femur, subsequent encounter for fracture with routine healing: Secondary | ICD-10-CM | POA: Diagnosis not present

## 2017-04-22 DIAGNOSIS — N183 Chronic kidney disease, stage 3 (moderate): Secondary | ICD-10-CM | POA: Diagnosis not present

## 2017-04-22 DIAGNOSIS — I129 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 stage 4 chronic kidney disease, or unspecified chronic kidney disease: Secondary | ICD-10-CM | POA: Diagnosis not present

## 2017-04-22 DIAGNOSIS — I480 Paroxysmal atrial fibrillation: Secondary | ICD-10-CM | POA: Diagnosis not present

## 2017-04-22 DIAGNOSIS — I6529 Occlusion and stenosis of unspecified carotid artery: Secondary | ICD-10-CM | POA: Diagnosis not present

## 2017-04-23 DIAGNOSIS — R55 Syncope and collapse: Secondary | ICD-10-CM | POA: Diagnosis not present

## 2017-04-24 ENCOUNTER — Telehealth: Payer: Self-pay

## 2017-04-24 DIAGNOSIS — J42 Unspecified chronic bronchitis: Secondary | ICD-10-CM | POA: Diagnosis not present

## 2017-04-24 DIAGNOSIS — I129 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 stage 4 chronic kidney disease, or unspecified chronic kidney disease: Secondary | ICD-10-CM | POA: Diagnosis not present

## 2017-04-24 DIAGNOSIS — M80052D Age-related osteoporosis with current pathological fracture, left femur, subsequent encounter for fracture with routine healing: Secondary | ICD-10-CM | POA: Diagnosis not present

## 2017-04-24 DIAGNOSIS — I480 Paroxysmal atrial fibrillation: Secondary | ICD-10-CM | POA: Diagnosis not present

## 2017-04-24 DIAGNOSIS — I6529 Occlusion and stenosis of unspecified carotid artery: Secondary | ICD-10-CM | POA: Diagnosis not present

## 2017-04-24 DIAGNOSIS — N183 Chronic kidney disease, stage 3 (moderate): Secondary | ICD-10-CM | POA: Diagnosis not present

## 2017-04-24 NOTE — Telephone Encounter (Signed)
Spoke to [place] from [facility]. Merry Proud provides nurse visits to the pt and he asked if a apt was schedule for the pt and I mention to him that it was on 05/02/2017. I also mention to him that I suggest for them to keep checking her PT/INR every time time they visit her which is twice a week just to keep up with it. He will see the pt Friday and a LPN will see her today and that they should write down the readings so DR. Manuella Ghazi can look at the readings. He asked if the pt readings are abnormal will we be open on Friday. Stated to him that we will be from, 8-12pm and if she need to be seen for her pt/inr they can call us that day since we are just seeing acute visits. Merry Proud understood

## 2017-04-27 DIAGNOSIS — I480 Paroxysmal atrial fibrillation: Secondary | ICD-10-CM | POA: Diagnosis not present

## 2017-04-27 DIAGNOSIS — N183 Chronic kidney disease, stage 3 (moderate): Secondary | ICD-10-CM | POA: Diagnosis not present

## 2017-04-27 DIAGNOSIS — M80052D Age-related osteoporosis with current pathological fracture, left femur, subsequent encounter for fracture with routine healing: Secondary | ICD-10-CM | POA: Diagnosis not present

## 2017-04-27 DIAGNOSIS — I129 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 stage 4 chronic kidney disease, or unspecified chronic kidney disease: Secondary | ICD-10-CM | POA: Diagnosis not present

## 2017-04-27 DIAGNOSIS — J42 Unspecified chronic bronchitis: Secondary | ICD-10-CM | POA: Diagnosis not present

## 2017-04-27 DIAGNOSIS — I6529 Occlusion and stenosis of unspecified carotid artery: Secondary | ICD-10-CM | POA: Diagnosis not present

## 2017-04-28 DIAGNOSIS — F17200 Nicotine dependence, unspecified, uncomplicated: Secondary | ICD-10-CM

## 2017-04-28 DIAGNOSIS — F17210 Nicotine dependence, cigarettes, uncomplicated: Secondary | ICD-10-CM | POA: Insufficient documentation

## 2017-05-01 DIAGNOSIS — I480 Paroxysmal atrial fibrillation: Secondary | ICD-10-CM | POA: Diagnosis not present

## 2017-05-01 DIAGNOSIS — N183 Chronic kidney disease, stage 3 (moderate): Secondary | ICD-10-CM | POA: Diagnosis not present

## 2017-05-01 DIAGNOSIS — I129 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 stage 4 chronic kidney disease, or unspecified chronic kidney disease: Secondary | ICD-10-CM | POA: Diagnosis not present

## 2017-05-01 DIAGNOSIS — I6529 Occlusion and stenosis of unspecified carotid artery: Secondary | ICD-10-CM | POA: Diagnosis not present

## 2017-05-01 DIAGNOSIS — M80052D Age-related osteoporosis with current pathological fracture, left femur, subsequent encounter for fracture with routine healing: Secondary | ICD-10-CM | POA: Diagnosis not present

## 2017-05-01 DIAGNOSIS — J42 Unspecified chronic bronchitis: Secondary | ICD-10-CM | POA: Diagnosis not present

## 2017-05-02 ENCOUNTER — Ambulatory Visit: Payer: Medicare Other | Admitting: Family Medicine

## 2017-05-02 DIAGNOSIS — S79912A Unspecified injury of left hip, initial encounter: Secondary | ICD-10-CM | POA: Diagnosis not present

## 2017-05-02 DIAGNOSIS — M25512 Pain in left shoulder: Secondary | ICD-10-CM | POA: Diagnosis not present

## 2017-05-03 DIAGNOSIS — I6529 Occlusion and stenosis of unspecified carotid artery: Secondary | ICD-10-CM | POA: Diagnosis not present

## 2017-05-03 DIAGNOSIS — J42 Unspecified chronic bronchitis: Secondary | ICD-10-CM | POA: Diagnosis not present

## 2017-05-03 DIAGNOSIS — I129 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 stage 4 chronic kidney disease, or unspecified chronic kidney disease: Secondary | ICD-10-CM | POA: Diagnosis not present

## 2017-05-03 DIAGNOSIS — M80052D Age-related osteoporosis with current pathological fracture, left femur, subsequent encounter for fracture with routine healing: Secondary | ICD-10-CM | POA: Diagnosis not present

## 2017-05-03 DIAGNOSIS — I480 Paroxysmal atrial fibrillation: Secondary | ICD-10-CM | POA: Diagnosis not present

## 2017-05-03 DIAGNOSIS — N183 Chronic kidney disease, stage 3 (moderate): Secondary | ICD-10-CM | POA: Diagnosis not present

## 2017-05-07 DIAGNOSIS — I6529 Occlusion and stenosis of unspecified carotid artery: Secondary | ICD-10-CM | POA: Diagnosis not present

## 2017-05-07 DIAGNOSIS — I129 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 stage 4 chronic kidney disease, or unspecified chronic kidney disease: Secondary | ICD-10-CM | POA: Diagnosis not present

## 2017-05-07 DIAGNOSIS — M80052D Age-related osteoporosis with current pathological fracture, left femur, subsequent encounter for fracture with routine healing: Secondary | ICD-10-CM | POA: Diagnosis not present

## 2017-05-07 DIAGNOSIS — J42 Unspecified chronic bronchitis: Secondary | ICD-10-CM | POA: Diagnosis not present

## 2017-05-07 DIAGNOSIS — I480 Paroxysmal atrial fibrillation: Secondary | ICD-10-CM | POA: Diagnosis not present

## 2017-05-07 DIAGNOSIS — N183 Chronic kidney disease, stage 3 (moderate): Secondary | ICD-10-CM | POA: Diagnosis not present

## 2017-05-08 ENCOUNTER — Encounter: Payer: Self-pay | Admitting: Family Medicine

## 2017-05-08 ENCOUNTER — Ambulatory Visit (INDEPENDENT_AMBULATORY_CARE_PROVIDER_SITE_OTHER): Payer: Medicare Other | Admitting: Family Medicine

## 2017-05-08 VITALS — BP 104/68 | HR 88 | Temp 97.7°F | Resp 16 | Ht 67.0 in | Wt 139.3 lb

## 2017-05-08 DIAGNOSIS — Z5181 Encounter for therapeutic drug level monitoring: Secondary | ICD-10-CM | POA: Diagnosis not present

## 2017-05-08 DIAGNOSIS — Z7901 Long term (current) use of anticoagulants: Secondary | ICD-10-CM

## 2017-05-08 DIAGNOSIS — I10 Essential (primary) hypertension: Secondary | ICD-10-CM | POA: Diagnosis not present

## 2017-05-08 DIAGNOSIS — R55 Syncope and collapse: Secondary | ICD-10-CM | POA: Diagnosis not present

## 2017-05-08 LAB — POCT INR: INR: 6.1

## 2017-05-08 NOTE — Progress Notes (Signed)
Name: Christine Burnett   MRN: [id number]    DOB: 1941-05-04   Date:05/08/2017       Progress Note  Subjective  Chief Complaint  Chief Complaint  Patient presents with  . Follow-up    PT/INR   . Hypertension    Was taking off aetenolol due to Bp being down and dizziness. Feeling better. Havent had any dizziness    Hypertension  This is a chronic problem. The problem is unchanged. The problem is controlled. Pertinent negatives include no blurred vision, chest pain, headaches, orthopnea or palpitations. Past treatments include beta blockers. Compliance problems include diet.  There is no history of kidney disease, CAD/MI or CVA.   Pt. has history of TIA, on anticoagulation with Coumadin 2 mg, was recently increased by [facility] rehab facility to '4mg'$ . She comes in today, INR is 6.1, supra-theraputic, denies any bleeding episodes, has been experiencing a headache for the past few days which has now resolved. Otherwise no hematuria, hematochezia, melena, prolonged bleeding etc.     Past Medical History:  Diagnosis Date  . A-fib ([place])   . Allergic rhinitis   . Anxiety   . Chronic bronchitis ([place])   . CKD (chronic kidney disease), stage III ([place])   . Cornea scar   . Depression    unspecified  . Diverticulosis   . Hyperlipidemia   . Hypertension   . Osteoporosis   . Risk for falls   . Situational disturbance   . TIA (transient ischemic attack)     Past Surgical History:  Procedure Laterality Date  . COLONOSCOPY  2016  . ESOPHAGUS SURGERY    . INTRAMEDULLARY (IM) NAIL INTERTROCHANTERIC Left 03/26/2017   Procedure: INTRAMEDULLARY (IM) NAIL INTERTROCHANTRIC;  Surgeon: Hessie Knows, MD;  Location: [facility] ORS;  Service: Orthopedics;  Laterality: Left;    Family History  Problem Relation Age of Onset  . Hypertension Mother   . Other Mother        [place] posioning  . Heart attack Father   . Prostate cancer Father   . Kidney cancer Neg Hx   . Bladder Cancer Neg Hx   . Colon  cancer Neg Hx   . Colon polyps Neg Hx   . Rectal cancer Neg Hx     Social History   Socioeconomic History  . Marital status: Divorced    Spouse name: Not on file  . Number of children: 4  . Years of education: 46  . Highest education level: High school graduate  Social Needs  . Financial resource strain: Not on file  . Food insecurity - worry: Not on file  . Food insecurity - inability: Not on file  . Transportation needs - medical: Not on file  . Transportation needs - non-medical: Not on file  Occupational History  . Not on file  Tobacco Use  . Smoking status: Current Every Day Smoker    Packs/day: 0.25    Years: 61.00    Pack years: 15.25    Types: Cigarettes  . Smokeless tobacco: Never Used  Substance and Sexual Activity  . Alcohol use: No  . Drug use: No  . Sexual activity: No  Other Topics Concern  . Not on file  Social History Narrative   Full Code   Current smoker   4 children   Denies alcohol use     Current Outpatient Medications:  .  acetaminophen (TYLENOL) 325 MG tablet, Take 2 tablets (650 mg total) by mouth every 6 (six) hours as  needed for mild pain, fever or headache (headache)., Disp: , Rfl:  .  Cholecalciferol 4000 units CAPS, Take 1 capsule by mouth daily., Disp: , Rfl:  .  PARoxetine (PAXIL) 30 MG tablet, TAKE 1 TABLET BY MOUTH ONCE DAILY, Disp: 90 tablet, Rfl: 0 .  pravastatin (PRAVACHOL) 40 MG tablet, Take 1 tablet (40 mg total) by mouth daily. Please put on file. Patient will call when finishing what she have., Disp: 90 tablet, Rfl: 0 .  warfarin (COUMADIN) 4 MG tablet, Take 4 mg daily by mouth., Disp: , Rfl:  .  albuterol (PROVENTIL HFA;VENTOLIN HFA) 108 (90 Base) MCG/ACT inhaler, Inhale 2 puffs into the lungs every 6 (six) hours as needed for wheezing or shortness of breath. (Patient not taking: Reported on 05/08/2017), Disp: 1 Inhaler, Rfl: 2 .  ALPRAZolam (XANAX) 0.25 MG tablet, Take 0.25 mg by mouth at bedtime as needed for anxiety., Disp: ,  Rfl:  .  alum & mag hydroxide-simeth (MAALOX/MYLANTA) 200-200-20 MG/5ML suspension, Take 30 mLs by mouth every 4 (four) hours as needed for indigestion. (Patient not taking: Reported on 05/08/2017), Disp: 355 mL, Rfl: 0 .  docusate sodium (COLACE) 100 MG capsule, Take 1 capsule (100 mg total) by mouth 2 (two) times daily as needed for mild constipation. (Patient not taking: Reported on 05/08/2017), Disp: 10 capsule, Rfl: 0 .  enoxaparin (LOVENOX) 40 MG/0.4ML injection, Inject 40 mg into the skin daily. Continue until INR therapeutic for DVT prophylaxis, Disp: , Rfl:  .  HYDROcodone-acetaminophen (NORCO/VICODIN) 5-325 MG tablet, Take 1-2 tablets by mouth every 6 (six) hours as needed for moderate pain. Give 1 tab for pain level 1-4; give 2 tabs for pain level 2-10, Disp: , Rfl:  .  ondansetron (ZOFRAN) 4 MG tablet, Take 1 tablet (4 mg total) by mouth every 6 (six) hours as needed for nausea. (Patient not taking: Reported on 05/08/2017), Disp: 20 tablet, Rfl: 0  Allergies  Allergen Reactions  . Cefuroxime Axetil     caused rash.  . Ciprofloxacin     Unknown  . Codeine      Review of Systems  Eyes: Negative for blurred vision.  Cardiovascular: Negative for chest pain, palpitations and orthopnea.  Neurological: Negative for headaches.      Objective  Vitals:   05/08/17 1550  BP: 104/68  Pulse: 88  Resp: 16  Temp: 97.7 F (36.5 C)  TempSrc: Oral  SpO2: 98%  Weight: 139 lb 4.8 oz (63.2 kg)  Height: '5\' 7"'$  (1.702 m)    Physical Exam  Constitutional: She is oriented to person, place, and time and well-developed, well-nourished, and in no distress.  HENT:  Head: Normocephalic and atraumatic.  Cardiovascular: Normal rate, regular rhythm and normal heart sounds.  No murmur heard. Pulmonary/Chest: Effort normal and breath sounds normal. She has no wheezes.  Abdominal: Soft. Bowel sounds are normal. There is no tenderness.  Musculoskeletal: She exhibits no edema.  Neurological: She is  alert and oriented to person, place, and time.  Psychiatric: Mood, memory, affect and judgment normal.  Nursing note and vitals reviewed.      Recent Results (from the past 2160 hour(s))  Urinalysis, Complete w Microscopic     Status: Abnormal   Collection Time: 02/23/17  2:26 PM  Result Value Ref Range   Color, Urine YELLOW YELLOW   APPearance CLEAR CLEAR   Specific Gravity, Urine 1.015 1.005 - 1.030   pH 5.5 5.0 - 8.0   Glucose, UA NEGATIVE NEGATIVE mg/dL   Hgb  urine dipstick SMALL (A) NEGATIVE   Bilirubin Urine NEGATIVE NEGATIVE   Ketones, ur NEGATIVE NEGATIVE mg/dL   Protein, ur NEGATIVE NEGATIVE mg/dL   Nitrite NEGATIVE NEGATIVE   Leukocytes, UA NEGATIVE NEGATIVE   Squamous Epithelial / LPF 0-5 (A) NONE SEEN   WBC, UA NONE SEEN 0 - 5 WBC/hpf   RBC / HPF 6-30 0 - 5 RBC/hpf   Bacteria, UA NONE SEEN NONE SEEN   Hyaline Casts, UA PRESENT    Ca Oxalate Crys, UA PRESENT   POCT INR     Status: Normal   Collection Time: 03/13/17  3:09 PM  Result Value Ref Range   INR 2.4   POCT HgB A1C     Status: None   Collection Time: 03/13/17  3:12 PM  Result Value Ref Range   Hemoglobin A1C 5.7   CBC with Differential/Platelet     Status: Abnormal   Collection Time: 03/13/17  3:47 PM  Result Value Ref Range   WBC 8.7 3.8 - 10.8 Thousand/uL   RBC 4.20 3.80 - 5.10 Million/uL   Hemoglobin 14.1 11.7 - 15.5 g/dL   HCT 41.2 35.0 - 45.0 %   MCV 98.1 80.0 - 100.0 fL   MCH 33.6 (H) 27.0 - 33.0 pg   MCHC 34.2 32.0 - 36.0 g/dL   RDW 12.6 11.0 - 15.0 %   Platelets 240 140 - 400 Thousand/uL   MPV 9.8 7.5 - 12.5 fL   Neutro Abs 4,916 1,500 - 7,800 cells/uL   Lymphs Abs 2,897 850 - 3,900 cells/uL   WBC mixed population 574 200 - 950 cells/uL   Eosinophils Absolute 261 15 - 500 cells/uL   Basophils Absolute 52 0 - 200 cells/uL   Neutrophils Relative % 56.5 %   Total Lymphocyte 33.3 %   Monocytes Relative 6.6 %   Eosinophils Relative 3.0 %   Basophils Relative 0.6 %  COMPLETE METABOLIC  PANEL WITH GFR     Status: Abnormal   Collection Time: 03/13/17  3:47 PM  Result Value Ref Range   Glucose, Bld 95 65 - 99 mg/dL    Comment: .            Fasting reference interval .    BUN 20 7 - 25 mg/dL   Creat 1.48 (H) 0.60 - 0.93 mg/dL    Comment: For patients >59 years of age, the reference limit for Creatinine is approximately 13% higher for people identified as African-American. .    GFR, Est Non African American 34 (L) > OR = 60 mL/min/1.1m   GFR, Est African American 39 (L) > OR = 60 mL/min/1.784m  BUN/Creatinine Ratio 14 6 - 22 (calc)   Sodium 135 135 - 146 mmol/L   Potassium 4.9 3.5 - 5.3 mmol/L   Chloride 101 98 - 110 mmol/L   CO2 27 20 - 32 mmol/L   Calcium 10.0 8.6 - 10.4 mg/dL   Total Protein 7.3 6.1 - 8.1 g/dL   Albumin 4.3 3.6 - 5.1 g/dL   Globulin 3.0 1.9 - 3.7 g/dL (calc)   AG Ratio 1.4 1.0 - 2.5 (calc)   Total Bilirubin 0.5 0.2 - 1.2 mg/dL   Alkaline phosphatase (APISO) 76 33 - 130 U/L   AST 16 10 - 35 U/L   ALT 10 6 - 29 U/L  CBC     Status: Abnormal   Collection Time: 03/25/17  4:52 PM  Result Value Ref Range   WBC 11.4 (H) 3.6 - 11.0  K/uL   RBC 4.02 3.80 - 5.20 MIL/uL   Hemoglobin 13.9 12.0 - 16.0 g/dL   HCT 40.1 35.0 - 47.0 %   MCV 99.8 80.0 - 100.0 fL   MCH 34.6 (H) 26.0 - 34.0 pg   MCHC 34.6 32.0 - 36.0 g/dL   RDW 13.5 11.5 - 14.5 %   Platelets 211 150 - 440 K/uL  Basic metabolic panel     Status: Abnormal   Collection Time: 03/25/17  4:52 PM  Result Value Ref Range   Sodium 137 135 - 145 mmol/L   Potassium 4.9 3.5 - 5.1 mmol/L    Comment: HEMOLYSIS AT THIS LEVEL MAY AFFECT RESULT   Chloride 104 101 - 111 mmol/L   CO2 25 22 - 32 mmol/L   Glucose, Bld 107 (H) 65 - 99 mg/dL   BUN 18 6 - 20 mg/dL   Creatinine, Ser 1.61 (H) 0.44 - 1.00 mg/dL   Calcium 9.1 8.9 - 10.3 mg/dL   GFR calc non Af Amer 30 (L) >60 mL/min   GFR calc Af Amer 34 (L) >60 mL/min    Comment: (NOTE) The eGFR has been calculated using the CKD EPI equation. This  calculation has not been validated in all clinical situations. eGFR's persistently <60 mL/min signify possible Chronic Kidney Disease.    Anion gap 8 5 - 15  Protime-INR     Status: Abnormal   Collection Time: 03/25/17  4:52 PM  Result Value Ref Range   Prothrombin Time 21.9 (H) 11.4 - 15.2 seconds   INR 1.93   APTT     Status: None   Collection Time: 03/25/17  4:52 PM  Result Value Ref Range   aPTT 34 24 - 36 seconds  Type and screen     Status: None   Collection Time: 03/25/17  6:24 PM  Result Value Ref Range   ABO/RH(D) O POS    Antibody Screen NEG    Sample Expiration 03/28/2017   MRSA PCR Screening     Status: None   Collection Time: 03/25/17  9:03 PM  Result Value Ref Range   MRSA by PCR NEGATIVE NEGATIVE    Comment:        The GeneXpert MRSA Assay (FDA approved for NASAL specimens only), is one component of a comprehensive MRSA colonization surveillance program. It is not intended to diagnose MRSA infection nor to guide or monitor treatment for MRSA infections.   Protime-INR     Status: Abnormal   Collection Time: 03/26/17  3:50 AM  Result Value Ref Range   Prothrombin Time 22.8 (H) 11.4 - 15.2 seconds   INR 9.14   Basic metabolic panel     Status: Abnormal   Collection Time: 03/26/17  3:50 AM  Result Value Ref Range   Sodium 139 135 - 145 mmol/L    Comment: ELECTROLYTES REPEATED.PMH   Potassium 4.8 3.5 - 5.1 mmol/L   Chloride 106 101 - 111 mmol/L   CO2 27 22 - 32 mmol/L   Glucose, Bld 132 (H) 65 - 99 mg/dL   BUN 17 6 - 20 mg/dL   Creatinine, Ser 1.54 (H) 0.44 - 1.00 mg/dL   Calcium 9.2 8.9 - 10.3 mg/dL   GFR calc non Af Amer 31 (L) >60 mL/min   GFR calc Af Amer 36 (L) >60 mL/min    Comment: (NOTE) The eGFR has been calculated using the CKD EPI equation. This calculation has not been validated in all clinical situations. eGFR's persistently <60  mL/min signify possible Chronic Kidney Disease.    Anion gap 6 5 - 15  CBC     Status: Abnormal    Collection Time: 03/26/17  3:50 AM  Result Value Ref Range   WBC 9.0 3.6 - 11.0 K/uL   RBC 3.57 (L) 3.80 - 5.20 MIL/uL   Hemoglobin 12.4 12.0 - 16.0 g/dL   HCT 36.1 35.0 - 47.0 %   MCV 101.1 (H) 80.0 - 100.0 fL   MCH 34.6 (H) 26.0 - 34.0 pg   MCHC 34.2 32.0 - 36.0 g/dL   RDW 13.8 11.5 - 14.5 %   Platelets 176 150 - 440 K/uL  Protime-INR     Status: Abnormal   Collection Time: 03/26/17 12:23 PM  Result Value Ref Range   Prothrombin Time 19.6 (H) 11.4 - 15.2 seconds   INR 1.67   Protime-INR     Status: Abnormal   Collection Time: 03/26/17  2:42 PM  Result Value Ref Range   Prothrombin Time 18.0 (H) 11.4 - 15.2 seconds   INR 1.50   CBC     Status: Abnormal   Collection Time: 03/27/17  4:48 AM  Result Value Ref Range   WBC 7.8 3.6 - 11.0 K/uL   RBC 3.50 (L) 3.80 - 5.20 MIL/uL   Hemoglobin 12.2 12.0 - 16.0 g/dL   HCT 35.2 35.0 - 47.0 %   MCV 100.5 (H) 80.0 - 100.0 fL   MCH 34.8 (H) 26.0 - 34.0 pg   MCHC 34.6 32.0 - 36.0 g/dL   RDW 12.8 11.5 - 14.5 %   Platelets 143 (L) 150 - 440 K/uL  Basic metabolic panel     Status: Abnormal   Collection Time: 03/27/17  4:48 AM  Result Value Ref Range   Sodium 137 135 - 145 mmol/L   Potassium 4.6 3.5 - 5.1 mmol/L   Chloride 106 101 - 111 mmol/L   CO2 26 22 - 32 mmol/L   Glucose, Bld 163 (H) 65 - 99 mg/dL   BUN 17 6 - 20 mg/dL   Creatinine, Ser 1.26 (H) 0.44 - 1.00 mg/dL   Calcium 8.9 8.9 - 10.3 mg/dL   GFR calc non Af Amer 40 (L) >60 mL/min   GFR calc Af Amer 46 (L) >60 mL/min    Comment: (NOTE) The eGFR has been calculated using the CKD EPI equation. This calculation has not been validated in all clinical situations. eGFR's persistently <60 mL/min signify possible Chronic Kidney Disease.    Anion gap 5 5 - 15  Protime-INR     Status: None   Collection Time: 03/27/17  4:48 AM  Result Value Ref Range   Prothrombin Time 14.8 11.4 - 15.2 seconds   INR 1.17   ECHOCARDIOGRAM COMPLETE     Status: None   Collection Time: 03/27/17   2:35 PM  Result Value Ref Range   Weight 2,096 oz   Height 67 in   BP 108/60 mmHg  Protime-INR     Status: None   Collection Time: 03/28/17  6:11 AM  Result Value Ref Range   Prothrombin Time 14.5 11.4 - 15.2 seconds   INR 1.14   CBC     Status: Abnormal   Collection Time: 03/28/17  6:11 AM  Result Value Ref Range   WBC 9.9 3.6 - 11.0 K/uL   RBC 3.37 (L) 3.80 - 5.20 MIL/uL   Hemoglobin 11.6 (L) 12.0 - 16.0 g/dL   HCT 34.0 (L) 35.0 - 47.0 %  MCV 100.9 (H) 80.0 - 100.0 fL   MCH 34.5 (H) 26.0 - 34.0 pg   MCHC 34.2 32.0 - 36.0 g/dL   RDW 13.3 11.5 - 14.5 %   Platelets 176 150 - 440 K/uL  Basic metabolic panel     Status: Abnormal   Collection Time: 03/28/17  6:11 AM  Result Value Ref Range   Sodium 139 135 - 145 mmol/L   Potassium 4.5 3.5 - 5.1 mmol/L   Chloride 103 101 - 111 mmol/L   CO2 29 22 - 32 mmol/L   Glucose, Bld 112 (H) 65 - 99 mg/dL   BUN 23 (H) 6 - 20 mg/dL   Creatinine, Ser 1.25 (H) 0.44 - 1.00 mg/dL   Calcium 9.2 8.9 - 10.3 mg/dL   GFR calc non Af Amer 40 (L) >60 mL/min   GFR calc Af Amer 47 (L) >60 mL/min    Comment: (NOTE) The eGFR has been calculated using the CKD EPI equation. This calculation has not been validated in all clinical situations. eGFR's persistently <60 mL/min signify possible Chronic Kidney Disease.    Anion gap 7 5 - 15  Protime-INR     Status: None   Collection Time: 03/29/17  4:40 AM  Result Value Ref Range   Prothrombin Time 15.0 11.4 - 15.2 seconds   INR 1.19   Protime-INR     Status: Abnormal   Collection Time: 03/30/17 12:39 PM  Result Value Ref Range   Prothrombin Time 16.1 (H) 11.4 - 15.2 seconds   INR 1.30   CBC with Differential/Platelet     Status: Abnormal   Collection Time: 03/30/17 12:39 PM  Result Value Ref Range   WBC 9.7 3.6 - 11.0 K/uL   RBC 3.52 (L) 3.80 - 5.20 MIL/uL   Hemoglobin 12.3 12.0 - 16.0 g/dL   HCT 35.9 35.0 - 47.0 %   MCV 101.8 (H) 80.0 - 100.0 fL   MCH 35.0 (H) 26.0 - 34.0 pg   MCHC 34.4 32.0 -  36.0 g/dL   RDW 13.8 11.5 - 14.5 %   Platelets 239 150 - 440 K/uL   Neutrophils Relative % 69 %   Neutro Abs 6.7 (H) 1.4 - 6.5 K/uL   Lymphocytes Relative 22 %   Lymphs Abs 2.1 1.0 - 3.6 K/uL   Monocytes Relative 6 %   Monocytes Absolute 0.6 0.2 - 0.9 K/uL   Eosinophils Relative 2 %   Eosinophils Absolute 0.2 0 - 0.7 K/uL   Basophils Relative 1 %   Basophils Absolute 0.0 0 - 0.1 K/uL  Comprehensive metabolic panel     Status: Abnormal   Collection Time: 03/30/17 12:39 PM  Result Value Ref Range   Sodium 140 135 - 145 mmol/L   Potassium 4.0 3.5 - 5.1 mmol/L   Chloride 102 101 - 111 mmol/L   CO2 27 22 - 32 mmol/L   Glucose, Bld 150 (H) 65 - 99 mg/dL   BUN 29 (H) 6 - 20 mg/dL   Creatinine, Ser 1.63 (H) 0.44 - 1.00 mg/dL   Calcium 9.7 8.9 - 10.3 mg/dL   Total Protein 7.6 6.5 - 8.1 g/dL   Albumin 3.5 3.5 - 5.0 g/dL   AST 26 15 - 41 U/L   ALT 18 14 - 54 U/L   Alkaline Phosphatase 62 38 - 126 U/L   Total Bilirubin 0.8 0.3 - 1.2 mg/dL   GFR calc non Af Amer 29 (L) >60 mL/min   GFR calc Af [place] L[place]  34 (L) >60 mL/min    Comment: (NOTE) The eGFR has been calculated using the CKD EPI equation. This calculation has not been validated in all clinical situations. eGFR's persistently <60 mL/min signify possible Chronic Kidney Disease.    Anion gap 11 5 - 15  Protime-INR     Status: Abnormal   Collection Time: 03/31/17  7:00 AM  Result Value Ref Range   Prothrombin Time 16.9 (H) 11.4 - 15.2 seconds   INR 1.39   Protime-INR     Status: Abnormal   Collection Time: 04/03/17  4:55 AM  Result Value Ref Range   Prothrombin Time 27.5 (H) 11.4 - 15.2 seconds   INR 2.58   Protime-INR     Status: Abnormal   Collection Time: 04/06/17 10:00 AM  Result Value Ref Range   Prothrombin Time 42.3 (H) 11.4 - 15.2 seconds   INR 4.48 (HH)     Comment: RESULT REPEATED AND VERIFIED CRITICAL RESULT CALLED TO, READ BACK BY AND VERIFIED WITH: SHANNON LEACH AT 1252 ON 04/06/17 [place].   Protime-INR     Status:  Abnormal   Collection Time: 04/09/17  5:35 AM  Result Value Ref Range   Prothrombin Time 32.6 (H) 11.4 - 15.2 seconds   INR 3.21   Protime-INR     Status: Abnormal   Collection Time: 04/10/17  5:00 AM  Result Value Ref Range   Prothrombin Time 33.4 (H) 11.4 - 15.2 seconds   INR 3.31   Protime-INR     Status: Abnormal   Collection Time: 04/11/17  5:40 AM  Result Value Ref Range   Prothrombin Time 34.9 (H) 11.4 - 15.2 seconds   INR 3.51   Protime-INR     Status: Abnormal   Collection Time: 04/12/17  5:00 AM  Result Value Ref Range   Prothrombin Time 32.7 (H) 11.4 - 15.2 seconds   INR 3.22   Protime-INR     Status: Abnormal   Collection Time: 04/16/17  1:50 PM  Result Value Ref Range   Prothrombin Time 30.1 (H) 11.4 - 15.2 seconds   INR 2.90   Protime-INR     Status: Abnormal   Collection Time: 04/17/17  4:35 AM  Result Value Ref Range   Prothrombin Time 35.5 (H) 11.4 - 15.2 seconds   INR 3.58   POCT INR     Status: Abnormal   Collection Time: 05/08/17  4:03 PM  Result Value Ref Range   INR 6.1      Assessment & Plan  1. Anticoagulation goal of INR 2 to 3 Supratherapeutic INR at 6.1, no evidence of bleeding but still recommended that she should be seen in the ER for reversal of Coumadin toxicity, patient has adamantly refused to go to the ER. She is otherwise well and only describes a headache in the past few days which is now resolved, no evidence of bleeding. Advised to come back for nurse visit in 3 days after stopping Coumadin and will recheck. - POCT INR  2. Essential hypertension Patient describes symptoms of orthostatic hypotension, she has now been off of atenolol and has felt fine     Delois Tolbert Asad A. [facility] 05/08/2017 4:12 PM

## 2017-05-09 DIAGNOSIS — R3121 Asymptomatic microscopic hematuria: Secondary | ICD-10-CM | POA: Diagnosis not present

## 2017-05-09 DIAGNOSIS — N183 Chronic kidney disease, stage 3 (moderate): Secondary | ICD-10-CM | POA: Diagnosis not present

## 2017-05-09 DIAGNOSIS — R3129 Other microscopic hematuria: Secondary | ICD-10-CM | POA: Diagnosis not present

## 2017-05-09 DIAGNOSIS — I480 Paroxysmal atrial fibrillation: Secondary | ICD-10-CM | POA: Diagnosis not present

## 2017-05-09 DIAGNOSIS — I6529 Occlusion and stenosis of unspecified carotid artery: Secondary | ICD-10-CM | POA: Diagnosis not present

## 2017-05-09 DIAGNOSIS — J42 Unspecified chronic bronchitis: Secondary | ICD-10-CM | POA: Diagnosis not present

## 2017-05-09 DIAGNOSIS — I10 Essential (primary) hypertension: Secondary | ICD-10-CM | POA: Diagnosis not present

## 2017-05-09 DIAGNOSIS — M80052D Age-related osteoporosis with current pathological fracture, left femur, subsequent encounter for fracture with routine healing: Secondary | ICD-10-CM | POA: Diagnosis not present

## 2017-05-09 DIAGNOSIS — N2581 Secondary hyperparathyroidism of renal origin: Secondary | ICD-10-CM | POA: Diagnosis not present

## 2017-05-09 DIAGNOSIS — I129 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 stage 4 chronic kidney disease, or unspecified chronic kidney disease: Secondary | ICD-10-CM | POA: Diagnosis not present

## 2017-05-10 DIAGNOSIS — I739 Peripheral vascular disease, unspecified: Secondary | ICD-10-CM | POA: Diagnosis not present

## 2017-05-11 ENCOUNTER — Ambulatory Visit (INDEPENDENT_AMBULATORY_CARE_PROVIDER_SITE_OTHER): Payer: Medicare Other

## 2017-05-11 ENCOUNTER — Telehealth: Payer: Self-pay

## 2017-05-11 DIAGNOSIS — Z5181 Encounter for therapeutic drug level monitoring: Secondary | ICD-10-CM | POA: Diagnosis not present

## 2017-05-11 DIAGNOSIS — J42 Unspecified chronic bronchitis: Secondary | ICD-10-CM | POA: Diagnosis not present

## 2017-05-11 DIAGNOSIS — Z7901 Long term (current) use of anticoagulants: Secondary | ICD-10-CM

## 2017-05-11 DIAGNOSIS — I480 Paroxysmal atrial fibrillation: Secondary | ICD-10-CM | POA: Diagnosis not present

## 2017-05-11 DIAGNOSIS — I6529 Occlusion and stenosis of unspecified carotid artery: Secondary | ICD-10-CM | POA: Diagnosis not present

## 2017-05-11 DIAGNOSIS — N183 Chronic kidney disease, stage 3 (moderate): Secondary | ICD-10-CM | POA: Diagnosis not present

## 2017-05-11 DIAGNOSIS — M80052D Age-related osteoporosis with current pathological fracture, left femur, subsequent encounter for fracture with routine healing: Secondary | ICD-10-CM | POA: Diagnosis not present

## 2017-05-11 DIAGNOSIS — I129 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 stage 4 chronic kidney disease, or unspecified chronic kidney disease: Secondary | ICD-10-CM | POA: Diagnosis not present

## 2017-05-11 LAB — POCT INR: INR: 2.7

## 2017-05-11 NOTE — Telephone Encounter (Signed)
Nurse visit PT/INR was 2.7.  Dr.Sowles stated that she should decrease her warfarin to 2 mg daily and come back next wednesday or Thursday to recheck her levels. Pt stated she had some 2 mg tabs left; this was the dosage she was on a month or two ago before it was decreased.

## 2017-05-11 NOTE — Progress Notes (Signed)
PT/ INR today was 2.7. Last visit on wednesday  PT/INR was 6.1 Dr. Manuella Ghazi asked pt to stop her coumadin 4 mg tab for three days.  Pt INR has came down. Dr. Manuella Ghazi was out for lunch therefore spoke to Dr. Ancil Boozer and he stated for her to lower her dosage to 2 mg daily and come back nest week; wednesday and or Thursday to recheck her PT/INR. Pt agreed. Pt states she was on 2 mg before the dosage was increased about a month ago and she has some left that she can take.

## 2017-05-13 NOTE — Telephone Encounter (Signed)
Agree with above, she is supposed to be on warfarin 2 mg every evening, her goal INR is between 2-3 and she should have the next INR repeated in 4 weeks

## 2017-05-16 DIAGNOSIS — I129 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 stage 4 chronic kidney disease, or unspecified chronic kidney disease: Secondary | ICD-10-CM | POA: Diagnosis not present

## 2017-05-16 DIAGNOSIS — J42 Unspecified chronic bronchitis: Secondary | ICD-10-CM | POA: Diagnosis not present

## 2017-05-16 DIAGNOSIS — N183 Chronic kidney disease, stage 3 (moderate): Secondary | ICD-10-CM | POA: Diagnosis not present

## 2017-05-16 DIAGNOSIS — M80052D Age-related osteoporosis with current pathological fracture, left femur, subsequent encounter for fracture with routine healing: Secondary | ICD-10-CM | POA: Diagnosis not present

## 2017-05-16 DIAGNOSIS — I480 Paroxysmal atrial fibrillation: Secondary | ICD-10-CM | POA: Diagnosis not present

## 2017-05-16 DIAGNOSIS — I6529 Occlusion and stenosis of unspecified carotid artery: Secondary | ICD-10-CM | POA: Diagnosis not present

## 2017-05-22 DIAGNOSIS — M80052D Age-related osteoporosis with current pathological fracture, left femur, subsequent encounter for fracture with routine healing: Secondary | ICD-10-CM | POA: Diagnosis not present

## 2017-05-22 DIAGNOSIS — N183 Chronic kidney disease, stage 3 (moderate): Secondary | ICD-10-CM | POA: Diagnosis not present

## 2017-05-22 DIAGNOSIS — J42 Unspecified chronic bronchitis: Secondary | ICD-10-CM | POA: Diagnosis not present

## 2017-05-22 DIAGNOSIS — I480 Paroxysmal atrial fibrillation: Secondary | ICD-10-CM | POA: Diagnosis not present

## 2017-05-22 DIAGNOSIS — I129 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 stage 4 chronic kidney disease, or unspecified chronic kidney disease: Secondary | ICD-10-CM | POA: Diagnosis not present

## 2017-05-22 DIAGNOSIS — I6529 Occlusion and stenosis of unspecified carotid artery: Secondary | ICD-10-CM | POA: Diagnosis not present

## 2017-05-24 DIAGNOSIS — I480 Paroxysmal atrial fibrillation: Secondary | ICD-10-CM | POA: Diagnosis not present

## 2017-05-24 DIAGNOSIS — M80052D Age-related osteoporosis with current pathological fracture, left femur, subsequent encounter for fracture with routine healing: Secondary | ICD-10-CM | POA: Diagnosis not present

## 2017-05-24 DIAGNOSIS — I129 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 stage 4 chronic kidney disease, or unspecified chronic kidney disease: Secondary | ICD-10-CM | POA: Diagnosis not present

## 2017-05-24 DIAGNOSIS — J42 Unspecified chronic bronchitis: Secondary | ICD-10-CM | POA: Diagnosis not present

## 2017-05-24 DIAGNOSIS — I6529 Occlusion and stenosis of unspecified carotid artery: Secondary | ICD-10-CM | POA: Diagnosis not present

## 2017-05-24 DIAGNOSIS — N183 Chronic kidney disease, stage 3 (moderate): Secondary | ICD-10-CM | POA: Diagnosis not present

## 2017-05-29 ENCOUNTER — Encounter: Payer: Self-pay | Admitting: Emergency Medicine

## 2017-05-29 ENCOUNTER — Emergency Department
Admission: EM | Admit: 2017-05-29 | Discharge: 2017-05-29 | Disposition: A | Discharge status: Home / Self Care | Payer: Medicare Other | Attending: Student in an Organized Health Care Education/Training Program | Admitting: Student in an Organized Health Care Education/Training Program

## 2017-05-29 DIAGNOSIS — Y939 Activity, unspecified: Secondary | ICD-10-CM | POA: Diagnosis not present

## 2017-05-29 DIAGNOSIS — N183 Chronic kidney disease, stage 3 (moderate): Secondary | ICD-10-CM | POA: Insufficient documentation

## 2017-05-29 DIAGNOSIS — I129 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 stage 4 chronic kidney disease, or unspecified chronic kidney disease: Secondary | ICD-10-CM | POA: Diagnosis not present

## 2017-05-29 DIAGNOSIS — W260XXA Contact with knife, initial encounter: Secondary | ICD-10-CM | POA: Diagnosis not present

## 2017-05-29 DIAGNOSIS — Y999 Unspecified external cause status: Secondary | ICD-10-CM | POA: Insufficient documentation

## 2017-05-29 DIAGNOSIS — Z79899 Other long term (current) drug therapy: Secondary | ICD-10-CM | POA: Insufficient documentation

## 2017-05-29 DIAGNOSIS — S61012A Laceration without foreign body of left thumb without damage to nail, initial encounter: Secondary | ICD-10-CM | POA: Insufficient documentation

## 2017-05-29 DIAGNOSIS — Y929 Unspecified place or not applicable: Secondary | ICD-10-CM | POA: Insufficient documentation

## 2017-05-29 DIAGNOSIS — Z7901 Long term (current) use of anticoagulants: Secondary | ICD-10-CM | POA: Insufficient documentation

## 2017-05-29 MED ORDER — LIDOCAINE HCL (PF) 1 % IJ SOLN
INTRAMUSCULAR | Status: AC
  Administered 2017-05-29: 5 mL
  Filled 2017-05-29: qty 5

## 2017-05-29 MED ORDER — CEPHALEXIN 500 MG PO CAPS: 500.0000 mg | ORAL_CAPSULE | Freq: Three times a day (TID) | ORAL | 0 refills | Status: AC

## 2017-05-29 MED ORDER — LIDOCAINE HCL 1 % IJ SOLN
5.0000 mL | Freq: Once | INTRAMUSCULAR | Status: AC
  Administered 2017-05-29: 5 mL
  Filled 2017-05-29: qty 5

## 2017-05-29 MED ORDER — TETANUS-DIPHTH-ACELL PERTUSSIS 5-2.5-18.5 LF-MCG/0.5 IM SUSP
0.5000 mL | Freq: Once | INTRAMUSCULAR | Status: AC
  Administered 2017-05-29: 0.5 mL via INTRAMUSCULAR
  Filled 2017-05-29: qty 0.5

## 2017-05-29 MED ORDER — SULFAMETHOXAZOLE-TRIMETHOPRIM 800-160 MG PO TABS: 1.0000 | ORAL_TABLET | Freq: Two times a day (BID) | ORAL | 0 refills | Status: DC

## 2017-05-29 NOTE — ED Provider Notes (Signed)
[facility] Emergency Department Provider Note  ____________________________________________  Time seen: Approximately 4:04 PM  I have reviewed the triage vital signs and the nursing notes.   HISTORY  Chief Complaint Laceration    HPI Christine Burnett is a 76 y.o. female presents to the emergency department with a 1-1/2 cm left thumb laceration sustained with a new, clean cutting knife accidentally.  Patient denies weakness, radiculopathy or changes in sensation of the upper extremities.  No alleviating measures of been attempted.   Past Medical History:  Diagnosis Date  . A-fib ([place])   . Allergic rhinitis   . Anxiety   . Chronic bronchitis ([place])   . CKD (chronic kidney disease), stage III ([place])   . Cornea scar   . Depression    unspecified  . Diverticulosis   . Hyperlipidemia   . Hypertension   . Osteoporosis   . Risk for falls   . Situational disturbance   . TIA (transient ischemic attack)     Patient Active Problem List   Diagnosis Date Noted  . Cigarette smoker motivated to quit 04/28/2017  . Hip fracture, unspecified laterality, closed, initial encounter ([place]) 03/25/2017  . Hip fracture ([place]) 03/25/2017  . Exposure to the flu 07/24/2016  . Hypertriglyceridemia 11/10/2015  . Hyperglycemia 11/10/2015  . Dysuria 11/10/2015  . Bronchitis 05/17/2015  . Cardiac murmur 04/07/2015  . Pelvic pressure in female 01/14/2015  . Feeling tired 01/14/2015  . Anxiety 01/05/2015  . Hyperlipidemia 01/05/2015  . Anticoagulation goal of INR 2 to 3 11/16/2014  . HTN (hypertension) 09/24/2014  . TIA (transient ischemic attack) 09/24/2014    Past Surgical History:  Procedure Laterality Date  . COLONOSCOPY  2016  . ESOPHAGUS SURGERY    . INTRAMEDULLARY (IM) NAIL INTERTROCHANTERIC Left 03/26/2017   Procedure: INTRAMEDULLARY (IM) NAIL INTERTROCHANTRIC;  Surgeon: Hessie Knows, MD;  Location: [facility] ORS;  Service: Orthopedics;  Laterality: Left;     Prior to Admission medications   Medication Sig Start Date End Date Taking? Authorizing Provider  acetaminophen (TYLENOL) 325 MG tablet Take 2 tablets (650 mg total) by mouth every 6 (six) hours as needed for mild pain, fever or headache (headache). 03/29/17   Gouru, Illene Silver, MD  albuterol (PROVENTIL HFA;VENTOLIN HFA) 108 (90 Base) MCG/ACT inhaler Inhale 2 puffs into the lungs every 6 (six) hours as needed for wheezing or shortness of breath. Patient not taking: Reported on 05/08/2017 08/01/16   Roselee Nova, MD  ALPRAZolam Duanne Moron) 0.25 MG tablet Take 0.25 mg by mouth at bedtime as needed for anxiety.    [provider]  alum & mag hydroxide-simeth (MAALOX/MYLANTA) 200-200-20 MG/5ML suspension Take 30 mLs by mouth every 4 (four) hours as needed for indigestion. Patient not taking: Reported on 05/08/2017 03/29/17   Nicholes Mango, MD  cephALEXin (KEFLEX) 500 MG capsule Take 1 capsule (500 mg total) by mouth 3 (three) times daily for 10 days. 05/29/17 06/08/17  Lannie Fields, PA-C  Cholecalciferol 4000 units CAPS Take 1 capsule by mouth daily.    [provider]  docusate sodium (COLACE) 100 MG capsule Take 1 capsule (100 mg total) by mouth 2 (two) times daily as needed for mild constipation. Patient not taking: Reported on 05/08/2017 03/29/17   Nicholes Mango, MD  enoxaparin (LOVENOX) 40 MG/0.4ML injection Inject 40 mg into the skin daily. Continue until INR therapeutic for DVT prophylaxis    [provider]  HYDROcodone-acetaminophen (NORCO/VICODIN) 5-325 MG tablet Take 1-2 tablets by mouth every 6 (  six) hours as needed for moderate pain. Give 1 tab for pain level 1-4; give 2 tabs for pain level 2-10    [provider]  ondansetron (ZOFRAN) 4 MG tablet Take 1 tablet (4 mg total) by mouth every 6 (six) hours as needed for nausea. Patient not taking: Reported on 05/08/2017 03/29/17   Nicholes Mango, MD  PARoxetine (PAXIL) 30 MG tablet TAKE 1 TABLET BY MOUTH ONCE DAILY  04/19/17   Roselee Nova, MD  pravastatin (PRAVACHOL) 40 MG tablet Take 1 tablet (40 mg total) by mouth daily. Please put on file. Patient will call when finishing what she have. 11/10/16   Roselee Nova, MD  warfarin (COUMADIN) 4 MG tablet Take 4 mg daily by mouth.    [provider]    Allergies Cefuroxime axetil; Ciprofloxacin; and Codeine  Family History  Problem Relation Age of Onset  . Hypertension Mother   . Other Mother        Ureme[place] posioning  . Heart attack Father   . Prostate cancer Father   . Kidney cancer Neg Hx   . Bladder Cancer Neg Hx   . Colon cancer Neg Hx   . Colon polyps Neg Hx   . Rectal cancer Neg Hx     Social History Social History   Tobacco Use  . Smoking status: Current Every Day Smoker    Packs/day: 0.25    Years: 61.00    Pack years: 15.25    Types: Cigarettes  . Smokeless tobacco: Never Used  Substance Use Topics  . Alcohol use: No  . Drug use: No     Review of Systems  Constitutional: No fever/chills Eyes: No visual changes. No discharge ENT: No upper respiratory complaints. Cardiovascular: no chest pain. Respiratory: no cough. No SOB. Musculoskeletal: Negative for musculoskeletal pain. Skin: Patient has 1.5 cm left thumb laceration. Neurological: Negative for headaches, focal weakness or numbness.   ____________________________________________   PHYSICAL EXAM:  VITAL SIGNS: ED Triage Vitals  Enc Vitals Group     BP 05/29/17 1336 122/77     Pulse Rate 05/29/17 1336 85     Resp 05/29/17 1336 17     Temp 05/29/17 1336 97.8 F (36.6 C)     Temp src --      SpO2 05/29/17 1336 96 %     Weight 05/29/17 1332 145 lb (65.8 kg)     Height --      Head Circumference --      Peak Flow --      Pain Score 05/29/17 1458 0     Pain Loc --      Pain Edu? --      Excl. in [place]? --      Constitutional: Alert and oriented. Well appearing and in no acute distress. Eyes: Conjunctivae are normal. PERRL. EOMI. Head:  Atraumatic. Cardiovascular: Normal rate, regular rhythm. Normal S1 and S2.  Good peripheral circulation. Respiratory: Normal respiratory effort without tachypnea or retractions. Lungs CTAB. Good air entry to the bases with no decreased or absent breath sounds. Musculoskeletal: Full range of motion to all extremities. No gross deformities appreciated. Neurologic:  Normal speech and language. No gross focal neurologic deficits are appreciated.  Skin: Patient has 1-1/2 cm left thumb laceration. Psychiatric: Mood and affect are normal. Speech and behavior are normal. Patient exhibits appropriate insight and judgement.   ____________________________________________   LABS (all labs ordered are listed, but only abnormal results are displayed)  Labs Reviewed -  No data to display ____________________________________________  EKG   ____________________________________________  RADIOLOGY  No results found.  ____________________________________________    PROCEDURES  Procedure(s) performed:    Procedures  LACERATION REPAIR Performed by: Lannie Fields Authorized by: Lannie Fields Consent: Verbal consent obtained. Risks and benefits: risks, benefits and alternatives were discussed Consent given by: patient Patient identity confirmed: provided demographic data Prepped and Draped in normal sterile fashion Wound explored  Laceration Location: Left thumb   Laceration Length: 1.5 cm  No Foreign Bodies seen or palpated  Anesthesia: local infiltration  Local anesthetic: lidocaine 1% without epinephrine  Anesthetic total: 3 ml  Irrigation method: syringe Amount of cleaning: standard  Skin closure: 5  Number of sutures: 5  Technique: Simple Interrupted.   Patient tolerance: Patient tolerated the procedure well with no immediate complications.   Medications  lidocaine (XYLOCAINE) 1 % (with pres) injection 5 mL (not administered)  lidocaine (PF) (XYLOCAINE) 1 %  injection (not administered)  Tdap (BOOSTRIX) injection 0.5 mL (not administered)     ____________________________________________   INITIAL IMPRESSION / ASSESSMENT AND PLAN / ED COURSE  Pertinent labs & imaging results that were available during my care of the patient were reviewed by me and considered in my medical decision making (see chart for details).  Review of the [place] CSRS was performed in accordance of the [place] prior to dispensing any controlled drugs.     Assessment and Plan: Left Thumb Laceration  Patient presents to the emergency department with a 1-1/2 cm left thumb laceration.  Patient underwent laceration repair in the emergency department without complication.  She was advised to have sutures removed by primary care in 5 days.  Vital signs were reassuring prior to discharge.  All patient questions were answered.    ____________________________________________  FINAL CLINICAL IMPRESSION(S) / ED DIAGNOSES  Final diagnoses:  Laceration of left thumb without foreign body without damage to nail, initial encounter      NEW MEDICATIONS STARTED DURING THIS VISIT:  ED Discharge Orders        Ordered    sulfamethoxazole-trimethoprim (BACTRIM DS,SEPTRA DS) 800-160 MG tablet  2 times daily,   Status:  Discontinued     05/29/17 1601    cephALEXin (KEFLEX) 500 MG capsule  3 times daily     05/29/17 1601          This chart was dictated using voice recognition software/Dragon. Despite best efforts to proofread, errors can occur which can change the meaning. Any change was purely unintentional.    Lannie Fields, PA-C 05/29/17 1610    Merlyn Lot, MD 06/02/17 [phone]

## 2017-05-29 NOTE — ED Triage Notes (Signed)
Patient presents to ED via POV from home. Patient cut her left thumb with a knife. Bleeding controlled at this time.

## 2017-06-01 ENCOUNTER — Telehealth: Payer: Self-pay

## 2017-06-01 NOTE — Telephone Encounter (Signed)
Requested discharge paperwork from Ramona at [place] [facility] today at 12:24 p.m. She states she will fax everything to our office. Patient notified about coming in monthly for PT/INR checks and states she already has a appointment on 06/07/2017 at 3 p.m.

## 2017-06-01 NOTE — Telephone Encounter (Signed)
Copied from [place] [id number]. Topic: General - Other >> Jun 01, 2017  8:50 AM Bea Graff, NT wrote: Reason for CRM: Tillie Rung from [facility] states pt has been discharged from [place] at [facility]. Pt will need to come to office for coumadin checks. CB#: [phone]

## 2017-06-01 NOTE — Telephone Encounter (Signed)
That is fine, she should continue monthly Coumadin checks, please request a discharge summary to review the services provided by [facility]

## 2017-06-07 ENCOUNTER — Ambulatory Visit (INDEPENDENT_AMBULATORY_CARE_PROVIDER_SITE_OTHER): Payer: Medicare Other | Admitting: Family Medicine

## 2017-06-07 ENCOUNTER — Encounter: Payer: Self-pay | Admitting: Family Medicine

## 2017-06-07 VITALS — BP 110/64 | HR 86 | Temp 97.4°F | Resp 16 | Wt 142.4 lb

## 2017-06-07 DIAGNOSIS — Z5181 Encounter for therapeutic drug level monitoring: Secondary | ICD-10-CM | POA: Diagnosis not present

## 2017-06-07 DIAGNOSIS — S61012D Laceration without foreign body of left thumb without damage to nail, subsequent encounter: Secondary | ICD-10-CM | POA: Diagnosis not present

## 2017-06-07 DIAGNOSIS — Z7901 Long term (current) use of anticoagulants: Secondary | ICD-10-CM

## 2017-06-07 LAB — POCT INR: INR: 2.4

## 2017-06-07 NOTE — Progress Notes (Signed)
Name: Christine Burnett   MRN: [id number]    DOB: 1940-07-19   Date:06/07/2017       Progress Note  Subjective  Chief Complaint  Chief Complaint  Patient presents with  . Follow-up    Pt/INR  . Suture / Staple Removal    finger    HPI  She'll presents for suture removal from laceration repair from a knife accident on Christmas Day. She ended up receiving 5-6 sutures in her left thumb and is here to have them removed. She denies any discharge from the laceration repair, no redness, fevers chills or other symptoms. She has been taking an antibiotic for prevention of infection  Patient is also here to obtain INR for her history of TIA and A. fib, she is on Coumadin 2 mg daily, reports no episodes of bleeding or other symptoms  Past Medical History:  Diagnosis Date  . A-fib ([place])   . Allergic rhinitis   . Anxiety   . Chronic bronchitis ([place])   . CKD (chronic kidney disease), stage III ([place])   . Cornea scar   . Depression    unspecified  . Diverticulosis   . Hyperlipidemia   . Hypertension   . Osteoporosis   . Risk for falls   . Situational disturbance   . TIA (transient ischemic attack)     Past Surgical History:  Procedure Laterality Date  . COLONOSCOPY  2016  . ESOPHAGUS SURGERY    . INTRAMEDULLARY (IM) NAIL INTERTROCHANTERIC Left 03/26/2017   Procedure: INTRAMEDULLARY (IM) NAIL INTERTROCHANTRIC;  Surgeon: Hessie Knows, MD;  Location: [facility] ORS;  Service: Orthopedics;  Laterality: Left;    Family History  Problem Relation Age of Onset  . Hypertension Mother   . Other Mother        Uremeic posioning  . Heart attack Father   . Prostate cancer Father   . Kidney cancer Neg Hx   . Bladder Cancer Neg Hx   . Colon cancer Neg Hx   . Colon polyps Neg Hx   . Rectal cancer Neg Hx     Social History   Socioeconomic History  . Marital status: Divorced    Spouse name: Not on file  . Number of children: 4  . Years of education: 16  . Highest education level: High  school graduate  Social Needs  . Financial resource strain: Not on file  . Food insecurity - worry: Not on file  . Food insecurity - inability: Not on file  . Transportation needs - medical: Not on file  . Transportation needs - non-medical: Not on file  Occupational History  . Not on file  Tobacco Use  . Smoking status: Former Smoker    Packs/day: 0.25    Years: 61.00    Pack years: 15.25    Types: Cigarettes    Last attempt to quit: 03/25/2017    Years since quitting: 0.2  . Smokeless tobacco: Never Used  Substance and Sexual Activity  . Alcohol use: No  . Drug use: No  . Sexual activity: No  Other Topics Concern  . Not on file  Social History Narrative   Full Code   Current smoker   4 children   Denies alcohol use     Current Outpatient Medications:  .  acetaminophen (TYLENOL) 325 MG tablet, Take 2 tablets (650 mg total) by mouth every 6 (six) hours as needed for mild pain, fever or headache (headache)., Disp: , Rfl:  .  ALPRAZolam (XANAX) 0.25  MG tablet, Take 0.25 mg by mouth at bedtime as needed for anxiety., Disp: , Rfl:  .  cephALEXin (KEFLEX) 500 MG capsule, Take 1 capsule (500 mg total) by mouth 3 (three) times daily for 10 days., Disp: 30 capsule, Rfl: 0 .  Cholecalciferol 4000 units CAPS, Take 1 capsule by mouth daily., Disp: , Rfl:  .  docusate sodium (COLACE) 100 MG capsule, Take 1 capsule (100 mg total) by mouth 2 (two) times daily as needed for mild constipation., Disp: 10 capsule, Rfl: 0 .  enoxaparin (LOVENOX) 40 MG/0.4ML injection, Inject 40 mg into the skin daily. Continue until INR therapeutic for DVT prophylaxis, Disp: , Rfl:  .  HYDROcodone-acetaminophen (NORCO/VICODIN) 5-325 MG tablet, Take 1-2 tablets by mouth every 6 (six) hours as needed for moderate pain. Give 1 tab for pain level 1-4; give 2 tabs for pain level 2-10, Disp: , Rfl:  .  ondansetron (ZOFRAN) 4 MG tablet, Take 1 tablet (4 mg total) by mouth every 6 (six) hours as needed for nausea.,  Disp: 20 tablet, Rfl: 0 .  PARoxetine (PAXIL) 30 MG tablet, TAKE 1 TABLET BY MOUTH ONCE DAILY, Disp: 90 tablet, Rfl: 0 .  pravastatin (PRAVACHOL) 40 MG tablet, Take 1 tablet (40 mg total) by mouth daily. Please put on file. Patient will call when finishing what she have., Disp: 90 tablet, Rfl: 0 .  warfarin (COUMADIN) 2 MG tablet, Take 1 tablet by mouth daily., Disp: , Rfl:  .  albuterol (PROVENTIL HFA;VENTOLIN HFA) 108 (90 Base) MCG/ACT inhaler, Inhale 2 puffs into the lungs every 6 (six) hours as needed for wheezing or shortness of breath. (Patient not taking: Reported on 06/07/2017), Disp: 1 Inhaler, Rfl: 2 .  alum & mag hydroxide-simeth (MAALOX/MYLANTA) 211-941-74 MG/5ML suspension, Take 30 mLs by mouth every 4 (four) hours as needed for indigestion. (Patient not taking: Reported on 06/07/2017), Disp: 355 mL, Rfl: 0  Allergies  Allergen Reactions  . Cefuroxime Axetil     caused rash.  . Ciprofloxacin     Unknown  . Codeine      ROS  Please see history of present illness for complete discussion of ROS  Objective  Vitals:   06/07/17 1509  BP: 110/64  Pulse: (!) 124  Resp: 16  Temp: (!) 97.4 F (36.3 C)  TempSrc: Oral  SpO2: 96%  Weight: 142 lb 6.4 oz (64.6 kg)    Physical Exam  Constitutional: She is oriented to person, place, and time and well-developed, well-nourished, and in no distress.  Cardiovascular: Normal rate, regular rhythm and normal heart sounds.  No murmur heard. Pulmonary/Chest: Effort normal and breath sounds normal. She has no wheezes.  Musculoskeletal:       Hands: Laceration repaired by sutures on the left distal thumb palmar surface, overall well approximated with no drainage, no erythema  Neurological: She is alert and oriented to person, place, and time.  Nursing note and vitals reviewed.    Assessment & Plan  1. Anticoagulation goal of INR 2 to 3 INR is 2.4, consider therapeutic, advised to continue on present dose of Coumadin, no side effects  and no episodes of bleeding reported - POCT INR  2. Laceration of left thumb without foreign body without damage to nail, subsequent encounter Advised patient to wait for a few more days to get the laceration repair heal completely and the edges are approximated correctly. I advise against fidgeting with her thumb which can cause an infection, she will return for nurse visit in 3-4  days for suture removal   Teegan Brandis [facility] 06/07/2017 3:21 PM

## 2017-06-11 ENCOUNTER — Ambulatory Visit: Payer: Medicare Other

## 2017-06-12 DIAGNOSIS — I739 Peripheral vascular disease, unspecified: Secondary | ICD-10-CM | POA: Diagnosis not present

## 2017-06-12 DIAGNOSIS — R0602 Shortness of breath: Secondary | ICD-10-CM | POA: Diagnosis not present

## 2017-06-12 DIAGNOSIS — I10 Essential (primary) hypertension: Secondary | ICD-10-CM | POA: Diagnosis not present

## 2017-06-12 DIAGNOSIS — R55 Syncope and collapse: Secondary | ICD-10-CM | POA: Diagnosis not present

## 2017-06-22 DIAGNOSIS — Z967 Presence of other bone and tendon implants: Secondary | ICD-10-CM | POA: Diagnosis not present

## 2017-06-22 DIAGNOSIS — Z8781 Personal history of (healed) traumatic fracture: Secondary | ICD-10-CM | POA: Diagnosis not present

## 2017-07-09 ENCOUNTER — Encounter: Payer: Self-pay | Admitting: Family Medicine

## 2017-07-09 ENCOUNTER — Ambulatory Visit (INDEPENDENT_AMBULATORY_CARE_PROVIDER_SITE_OTHER): Payer: Medicare Other | Admitting: Family Medicine

## 2017-07-09 VITALS — BP 112/62 | HR 90 | Temp 97.8°F | Resp 16 | Ht 67.0 in | Wt 145.4 lb

## 2017-07-09 DIAGNOSIS — Z5181 Encounter for therapeutic drug level monitoring: Secondary | ICD-10-CM

## 2017-07-09 DIAGNOSIS — F419 Anxiety disorder, unspecified: Secondary | ICD-10-CM

## 2017-07-09 DIAGNOSIS — G459 Transient cerebral ischemic attack, unspecified: Secondary | ICD-10-CM

## 2017-07-09 DIAGNOSIS — Z7901 Long term (current) use of anticoagulants: Secondary | ICD-10-CM

## 2017-07-09 DIAGNOSIS — E781 Pure hyperglyceridemia: Secondary | ICD-10-CM

## 2017-07-09 LAB — POCT INR: INR: 3.3

## 2017-07-09 MED ORDER — WARFARIN SODIUM 2 MG PO TABS: 2.0000 mg | ORAL_TABLET | Freq: Every day | ORAL | 3 refills | Status: DC

## 2017-07-09 MED ORDER — PRAVASTATIN SODIUM 40 MG PO TABS: 40.0000 mg | ORAL_TABLET | Freq: Every day | ORAL | 0 refills | Status: DC

## 2017-07-09 MED ORDER — PAROXETINE HCL 30 MG PO TABS: 30.0000 mg | ORAL_TABLET | Freq: Every day | ORAL | 0 refills | Status: DC

## 2017-07-09 NOTE — Progress Notes (Signed)
Name: Christine Burnett   MRN: [id number]    DOB: 1941/03/16   Date:07/09/2017       Progress Note  Subjective  Chief Complaint  Chief Complaint  Patient presents with  . Follow-up    PT/INR AND 3 MONTHS     Anxiety  Presents for follow-up visit. Patient reports no depressed mood, excessive worry, nervous/anxious behavior or shortness of breath. The quality of sleep is good.    Hyperlipidemia  This is a chronic problem. The problem is uncontrolled. Recent lipid tests were reviewed and are high. Pertinent negatives include no leg pain, myalgias or shortness of breath. Current antihyperlipidemic treatment includes statins.    Pt. presents for follow up of anti-coagulation management, she has history of TIA and paroxysmal A-fib, on chronic anti-coagulation with Coumadin 2mg  daily. Her INR is 3.3 which is above the range (2-3), she denies any bleeding episodes.    Past Medical History:  Diagnosis Date  . A-fib ([employer])   . Allergic rhinitis   . Anxiety   . Chronic bronchitis ([place])   . CKD (chronic kidney disease), stage III ([place])   . Cornea scar   . Depression    unspecified  . Diverticulosis   . Hyperlipidemia   . Hypertension   . Osteoporosis   . Risk for falls   . Situational disturbance   . TIA (transient ischemic attack)     Past Surgical History:  Procedure Laterality Date  . COLONOSCOPY  2016  . ESOPHAGUS SURGERY    . INTRAMEDULLARY (IM) NAIL INTERTROCHANTERIC Left 03/26/2017   Procedure: INTRAMEDULLARY (IM) NAIL INTERTROCHANTRIC;  Surgeon: Hessie Knows, MD;  Location: [facility] ORS;  Service: Orthopedics;  Laterality: Left;    Family History  Problem Relation Age of Onset  . Other Mother        [place] posioning  . Heart attack Father   . Prostate cancer Father   . Heart attack Brother   . Diabetes Brother   . Hypertension Daughter   . Kidney cancer Neg Hx   . Bladder Cancer Neg Hx   . Colon cancer Neg Hx   . Colon polyps Neg Hx   . Rectal cancer Neg Hx      Social History   Socioeconomic History  . Marital status: Divorced    Spouse name: Not on file  . Number of children: 4  . Years of education: 83  . Highest education level: High school graduate  Social Needs  . Financial resource strain: Not on file  . Food insecurity - worry: Not on file  . Food insecurity - inability: Not on file  . Transportation needs - medical: Not on file  . Transportation needs - non-medical: Not on file  Occupational History  . Not on file  Tobacco Use  . Smoking status: Former Smoker    Packs/day: 0.25    Years: 61.00    Pack years: 15.25    Types: Cigarettes    Last attempt to quit: 03/25/2017    Years since quitting: 0.2  . Smokeless tobacco: Never Used  Substance and Sexual Activity  . Alcohol use: No  . Drug use: No  . Sexual activity: No  Other Topics Concern  . Not on file  Social History Narrative   Full Code   Current smoker   4 children   Denies alcohol use     Current Outpatient Medications:  .  acetaminophen (TYLENOL 8 HOUR ARTHRITIS PAIN) 650 MG CR tablet, Take 1,300 mg by  mouth every 8 (eight) hours as needed for pain., Disp: , Rfl:  .  ALPRAZolam (XANAX) 0.25 MG tablet, Take 0.25 mg by mouth at bedtime as needed for anxiety., Disp: , Rfl:  .  Cholecalciferol 4000 units CAPS, Take 1 capsule by mouth daily., Disp: , Rfl:  .  docusate sodium (COLACE) 100 MG capsule, Take 1 capsule (100 mg total) by mouth 2 (two) times daily as needed for mild constipation., Disp: 10 capsule, Rfl: 0 .  enoxaparin (LOVENOX) 40 MG/0.4ML injection, Inject 40 mg into the skin daily. Continue until INR therapeutic for DVT prophylaxis, Disp: , Rfl:  .  HYDROcodone-acetaminophen (NORCO/VICODIN) 5-325 MG tablet, Take 1-2 tablets by mouth every 6 (six) hours as needed for moderate pain. Give 1 tab for pain level 1-4; give 2 tabs for pain level 2-10, Disp: , Rfl:  .  ondansetron (ZOFRAN) 4 MG tablet, Take 1 tablet (4 mg total) by mouth every 6 (six) hours  as needed for nausea., Disp: 20 tablet, Rfl: 0 .  PARoxetine (PAXIL) 30 MG tablet, TAKE 1 TABLET BY MOUTH ONCE DAILY, Disp: 90 tablet, Rfl: 0 .  pravastatin (PRAVACHOL) 40 MG tablet, Take 1 tablet (40 mg total) by mouth daily. Please put on file. Patient will call when finishing what she have., Disp: 90 tablet, Rfl: 0 .  warfarin (COUMADIN) 2 MG tablet, Take 1 tablet by mouth daily., Disp: , Rfl:  .  albuterol (PROVENTIL HFA;VENTOLIN HFA) 108 (90 Base) MCG/ACT inhaler, Inhale 2 puffs into the lungs every 6 (six) hours as needed for wheezing or shortness of breath. (Patient not taking: Reported on 06/07/2017), Disp: 1 Inhaler, Rfl: 2 .  alum & mag hydroxide-simeth (MAALOX/MYLANTA) 573-220-25 MG/5ML suspension, Take 30 mLs by mouth every 4 (four) hours as needed for indigestion. (Patient not taking: Reported on 06/07/2017), Disp: 355 mL, Rfl: 0  Allergies  Allergen Reactions  . Cefuroxime Axetil     caused rash.  . Ciprofloxacin     Unknown  . Codeine      Review of Systems  Respiratory: Negative for shortness of breath.   Musculoskeletal: Negative for myalgias.  Psychiatric/Behavioral: The patient is not nervous/anxious.       Objective  Vitals:   07/09/17 1427  BP: 112/62  Pulse: 90  Resp: 16  Temp: 97.8 F (36.6 C)  TempSrc: Oral  SpO2: 96%  Weight: 145 lb 6.4 oz (66 kg)  Height: 5\' 7"  (1.702 m)    Physical Exam  Constitutional: She is oriented to person, place, and time and well-developed, well-nourished, and in no distress.  HENT:  Head: Normocephalic and atraumatic.  Cardiovascular: Normal rate, regular rhythm and normal heart sounds.  No murmur heard. Pulmonary/Chest: Effort normal and breath sounds normal. She has no wheezes.  Musculoskeletal: She exhibits no edema.  Neurological: She is alert and oriented to person, place, and time.  Psychiatric: Mood, memory, affect and judgment normal.  Nursing note and vitals reviewed.    Recent Results (from the past 2160  hour(s))  Protime-INR     Status: Abnormal   Collection Time: 04/11/17  5:40 AM  Result Value Ref Range   Prothrombin Time 34.9 (H) 11.4 - 15.2 seconds   INR 3.51   Protime-INR     Status: Abnormal   Collection Time: 04/12/17  5:00 AM  Result Value Ref Range   Prothrombin Time 32.7 (H) 11.4 - 15.2 seconds   INR 3.22   Protime-INR     Status: Abnormal   Collection Time:  04/16/17  1:50 PM  Result Value Ref Range   Prothrombin Time 30.1 (H) 11.4 - 15.2 seconds   INR 2.90   Protime-INR     Status: Abnormal   Collection Time: 04/17/17  4:35 AM  Result Value Ref Range   Prothrombin Time 35.5 (H) 11.4 - 15.2 seconds   INR 3.58   POCT INR     Status: Abnormal   Collection Time: 05/08/17  4:03 PM  Result Value Ref Range   INR 6.1   POCT INR     Status: Normal   Collection Time: 05/11/17  1:39 PM  Result Value Ref Range   INR 2.7   POCT INR     Status: Normal   Collection Time: 06/07/17  3:11 PM  Result Value Ref Range   INR 2.4   POCT INR     Status: Abnormal   Collection Time: 07/09/17  2:27 PM  Result Value Ref Range   INR 3.3      Assessment & Plan  1. Anticoagulation goal of INR 2 to 3 INR 3.3, considered supratherapeutic. And vitals to DC Coumadin for 3 days from 2/4-2/6 and return on 2/7 to repeat INR - POCT INR - warfarin (COUMADIN) 2 MG tablet; Take 1 tablet (2 mg total) by mouth daily.  Dispense: 90 tablet; Refill: 3  2. TIA (transient ischemic attack)  - warfarin (COUMADIN) 2 MG tablet; Take 1 tablet (2 mg total) by mouth daily.  Dispense: 90 tablet; Refill: 3  3. Anxiety Symptoms are stable and in remission on Paxil - PARoxetine (PAXIL) 30 MG tablet; Take 1 tablet (30 mg total) by mouth daily.  Dispense: 90 tablet; Refill: 0  4. Hypertriglyceridemia  - pravastatin (PRAVACHOL) 40 MG tablet; Take 1 tablet (40 mg total) by mouth daily.  Dispense: 90 tablet; Refill: 0 - Lipid panel  Margaux Engen [facility] 07/09/2017 2:53 PM

## 2017-07-12 ENCOUNTER — Ambulatory Visit (INDEPENDENT_AMBULATORY_CARE_PROVIDER_SITE_OTHER): Payer: Medicare Other | Admitting: Family Medicine

## 2017-07-12 ENCOUNTER — Encounter: Payer: Self-pay | Admitting: Family Medicine

## 2017-07-12 VITALS — BP 112/60 | HR 81 | Temp 98.0°F | Resp 16 | Wt 144.8 lb

## 2017-07-12 DIAGNOSIS — Z5181 Encounter for therapeutic drug level monitoring: Secondary | ICD-10-CM | POA: Diagnosis not present

## 2017-07-12 DIAGNOSIS — Z7901 Long term (current) use of anticoagulants: Secondary | ICD-10-CM

## 2017-07-12 DIAGNOSIS — E781 Pure hyperglyceridemia: Secondary | ICD-10-CM | POA: Diagnosis not present

## 2017-07-12 LAB — POCT INR: INR: 1.7

## 2017-07-12 LAB — LIPID PANEL
Cholesterol: 166 mg/dL (ref <200)
HDL: 50 mg/dL — AB (ref >50)
LDL CHOLESTEROL (CALC): 91 mg/dL
NON-HDL CHOLESTEROL (CALC): 116 mg/dL (ref <130)
TRIGLYCERIDES: 153 mg/dL — AB (ref <150)
Total CHOL/HDL Ratio: 3.3 (calc) (ref <5.0)

## 2017-07-12 NOTE — Progress Notes (Signed)
Name: Christine Burnett   MRN: [id number]    DOB: Oct 10, 1940   Date:07/12/2017       Progress Note  Subjective  Chief Complaint  Chief Complaint  Patient presents with  . Follow-up    PT/INR check    HPI  Pt. Presents for recheck of INR, she has history of TIA, on chronic long-term anticoagulation with Coumadin 2mg  daily, her last INR 3 days ago was 3.3, she was asked to hold Coumadin for three day. Today, her INR is 1.7 which is subtherapeutic. She denies any chest pain, dizziness, palpitations, etc.     Past Medical History:  Diagnosis Date  . A-fib ([place])   . Allergic rhinitis   . Anxiety   . Chronic bronchitis ([place])   . CKD (chronic kidney disease), stage III ([place])   . Cornea scar   . Depression    unspecified  . Diverticulosis   . Hyperlipidemia   . Hypertension   . Osteoporosis   . Risk for falls   . Situational disturbance   . TIA (transient ischemic attack)     Past Surgical History:  Procedure Laterality Date  . COLONOSCOPY  2016  . ESOPHAGUS SURGERY    . INTRAMEDULLARY (IM) NAIL INTERTROCHANTERIC Left 03/26/2017   Procedure: INTRAMEDULLARY (IM) NAIL INTERTROCHANTRIC;  Surgeon: Hessie Knows, MD;  Location: [facility] ORS;  Service: Orthopedics;  Laterality: Left;    Family History  Problem Relation Age of Onset  . Other Mother        [place] posioning  . Heart attack Father   . Prostate cancer Father   . Heart attack Brother   . Diabetes Brother   . Hypertension Daughter   . Kidney cancer Neg Hx   . Bladder Cancer Neg Hx   . Colon cancer Neg Hx   . Colon polyps Neg Hx   . Rectal cancer Neg Hx     Social History   Socioeconomic History  . Marital status: Divorced    Spouse name: Not on file  . Number of children: 4  . Years of education: 56  . Highest education level: High school graduate  Social Needs  . Financial resource strain: Not on file  . Food insecurity - worry: Not on file  . Food insecurity - inability: Not on file  . Transportation  needs - medical: Not on file  . Transportation needs - non-medical: Not on file  Occupational History  . Not on file  Tobacco Use  . Smoking status: Former Smoker    Packs/day: 0.25    Years: 61.00    Pack years: 15.25    Types: Cigarettes    Last attempt to quit: 03/25/2017    Years since quitting: 0.2  . Smokeless tobacco: Never Used  Substance and Sexual Activity  . Alcohol use: No  . Drug use: No  . Sexual activity: No  Other Topics Concern  . Not on file  Social History Narrative   Full Code   Current smoker   4 children   Denies alcohol use     Current Outpatient Medications:  .  acetaminophen (TYLENOL 8 HOUR ARTHRITIS PAIN) 650 MG CR tablet, Take 1,300 mg by mouth every 8 (eight) hours as needed for pain., Disp: , Rfl:  .  ALPRAZolam (XANAX) 0.25 MG tablet, Take 0.25 mg by mouth at bedtime as needed for anxiety., Disp: , Rfl:  .  Cholecalciferol 4000 units CAPS, Take 1 capsule by mouth daily., Disp: , Rfl:  .  docusate sodium (COLACE) 100 MG capsule, Take 1 capsule (100 mg total) by mouth 2 (two) times daily as needed for mild constipation., Disp: 10 capsule, Rfl: 0 .  enoxaparin (LOVENOX) 40 MG/0.4ML injection, Inject 40 mg into the skin daily. Continue until INR therapeutic for DVT prophylaxis, Disp: , Rfl:  .  HYDROcodone-acetaminophen (NORCO/VICODIN) 5-325 MG tablet, Take 1-2 tablets by mouth every 6 (six) hours as needed for moderate pain. Give 1 tab for pain level 1-4; give 2 tabs for pain level 2-10, Disp: , Rfl:  .  ondansetron (ZOFRAN) 4 MG tablet, Take 1 tablet (4 mg total) by mouth every 6 (six) hours as needed for nausea., Disp: 20 tablet, Rfl: 0 .  PARoxetine (PAXIL) 30 MG tablet, Take 1 tablet (30 mg total) by mouth daily., Disp: 90 tablet, Rfl: 0 .  pravastatin (PRAVACHOL) 40 MG tablet, Take 1 tablet (40 mg total) by mouth daily., Disp: 90 tablet, Rfl: 0 .  warfarin (COUMADIN) 2 MG tablet, Take 1 tablet (2 mg total) by mouth daily., Disp: 90 tablet, Rfl:  3 .  albuterol (PROVENTIL HFA;VENTOLIN HFA) 108 (90 Base) MCG/ACT inhaler, Inhale 2 puffs into the lungs every 6 (six) hours as needed for wheezing or shortness of breath. (Patient not taking: Reported on 06/07/2017), Disp: 1 Inhaler, Rfl: 2 .  alum & mag hydroxide-simeth (MAALOX/MYLANTA) 242-353-61 MG/5ML suspension, Take 30 mLs by mouth every 4 (four) hours as needed for indigestion. (Patient not taking: Reported on 06/07/2017), Disp: 355 mL, Rfl: 0  Allergies  Allergen Reactions  . Cefuroxime Axetil     caused rash.  . Ciprofloxacin     Unknown  . Codeine      ROS  Please see history of present illness for complete description of ROS  Objective  Vitals:   07/12/17 1405  BP: 112/60  Pulse: 81  Resp: 16  Temp: 98 F (36.7 C)  TempSrc: Oral  SpO2: 96%  Weight: 144 lb 12.8 oz (65.7 kg)    Physical Exam  Constitutional: She is oriented to person, place, and time and well-developed, well-nourished, and in no distress.  HENT:  Head: Normocephalic and atraumatic.  Cardiovascular: Normal rate, regular rhythm and normal heart sounds.  No murmur heard. Pulmonary/Chest: Effort normal and breath sounds normal. She has no wheezes.  Musculoskeletal: She exhibits no edema.  Neurological: She is alert and oriented to person, place, and time.  Psychiatric: Mood, memory, affect and judgment normal.  Nursing note and vitals reviewed.      Recent Results (from the past 2160 hour(s))  Protime-INR     Status: Abnormal   Collection Time: 04/16/17  1:50 PM  Result Value Ref Range   Prothrombin Time 30.1 (H) 11.4 - 15.2 seconds   INR 2.90   Protime-INR     Status: Abnormal   Collection Time: 04/17/17  4:35 AM  Result Value Ref Range   Prothrombin Time 35.5 (H) 11.4 - 15.2 seconds   INR 3.58   POCT INR     Status: Abnormal   Collection Time: 05/08/17  4:03 PM  Result Value Ref Range   INR 6.1   POCT INR     Status: Normal   Collection Time: 05/11/17  1:39 PM  Result Value Ref  Range   INR 2.7   POCT INR     Status: Normal   Collection Time: 06/07/17  3:11 PM  Result Value Ref Range   INR 2.4   POCT INR     Status: Abnormal  Collection Time: 07/09/17  2:27 PM  Result Value Ref Range   INR 3.3   POCT INR     Status: Abnormal   Collection Time: 07/12/17  2:06 PM  Result Value Ref Range   INR 1.7      Assessment & Plan  1. Anticoagulation goal of INR 2 to 3 INR is 1.7, advised to restart Coumadin at 2mg  daily and return in 1 month to recheck levels.   - POCT INR   Hamdi Vari [facility] 07/12/2017 2:07 PM

## 2017-08-09 ENCOUNTER — Ambulatory Visit (INDEPENDENT_AMBULATORY_CARE_PROVIDER_SITE_OTHER): Payer: Medicare Other | Admitting: Family Medicine

## 2017-08-09 ENCOUNTER — Encounter: Payer: Self-pay | Admitting: Family Medicine

## 2017-08-09 VITALS — BP 130/70 | HR 96 | Temp 97.3°F | Resp 16 | Wt 150.3 lb

## 2017-08-09 DIAGNOSIS — Z5181 Encounter for therapeutic drug level monitoring: Secondary | ICD-10-CM | POA: Diagnosis not present

## 2017-08-09 DIAGNOSIS — I4891 Unspecified atrial fibrillation: Secondary | ICD-10-CM | POA: Insufficient documentation

## 2017-08-09 DIAGNOSIS — Z7901 Long term (current) use of anticoagulants: Secondary | ICD-10-CM

## 2017-08-09 DIAGNOSIS — E781 Pure hyperglyceridemia: Secondary | ICD-10-CM

## 2017-08-09 DIAGNOSIS — I480 Paroxysmal atrial fibrillation: Secondary | ICD-10-CM | POA: Diagnosis not present

## 2017-08-09 DIAGNOSIS — E782 Mixed hyperlipidemia: Secondary | ICD-10-CM

## 2017-08-09 DIAGNOSIS — F419 Anxiety disorder, unspecified: Secondary | ICD-10-CM | POA: Diagnosis not present

## 2017-08-09 DIAGNOSIS — I10 Essential (primary) hypertension: Secondary | ICD-10-CM

## 2017-08-09 DIAGNOSIS — S72002D Fracture of unspecified part of neck of left femur, subsequent encounter for closed fracture with routine healing: Secondary | ICD-10-CM

## 2017-08-09 LAB — POCT INR: INR: 2.1

## 2017-08-09 NOTE — Assessment & Plan Note (Signed)
Continue coumadin for this and TIAs and carotid athero (per [place] doctor)

## 2017-08-09 NOTE — Assessment & Plan Note (Signed)
Try to get LDL under 70; try to eat better, limit saturated fats; recheck lipids in 2-3 months during one of the INR check

## 2017-08-09 NOTE — Assessment & Plan Note (Signed)
Controlled today; no medicines, controlling with diet

## 2017-08-09 NOTE — Patient Instructions (Signed)
Return in one month.  

## 2017-08-09 NOTE — Progress Notes (Signed)
BP 130/70   Pulse 96   Temp (!) 97.3 F (36.3 C) (Oral)   Resp 16   Wt 150 lb 4.8 oz (68.2 kg)   SpO2 96%   BMI 23.54 kg/m    Subjective:    Patient ID: Christine Burnett, female    DOB: Jan 20, 1941, 77 y.o.   MRN: [id number]  HPI: Christine Burnett is a 77 y.o. female  Chief Complaint  Patient presents with  . Anticoagulation    1 month f/u last INR 1.7 and is taking 2mg  qd    HPI Patient is here for anticoagulation management She takes coumadin Last INR was 1.7; taking 2 mg daily She has a history of atrial fibrillation and TIA However, the chart also notes at risk for falls Normal sinus rhythm on the last EKG She says that her doctor in [place] actually put her on coumadin because her carotid arteries were getting clogged Dr. Neoma Laming checked her carotids out; going to see Dr. Fletcher Anon soon, but just hasn't made the appt; has his number No SHOB, no chest pain Last carotid scan in November In October she fell over a dog and broke her hip; uses a cane; they talked to her about the risk of falls and coumadin, could cause a fatal head bleed; she is aware; wishes to stay on it; no bleeding She was on APAP in [place]; just takes one tylenol a day at bedtime Alprazolam; was prescribed by Dr. Manuella Ghazi (I explained that can increase falls) Paxil; it's doing what it needs to do; lives with child and grandchildren; no SI/HI High cholesterol; taking 40 mg; last LDL 91  Depression screen [facility] 2/9 08/09/2017 06/07/2017 05/08/2017 01/16/2017 12/25/2016  Decreased Interest 0 0 0 0 1  Down, Depressed, Hopeless 0 0 0 0 0  PHQ - 2 Score 0 0 0 0 1   Relevant past medical, surgical, family and social history reviewed Past Medical History:  Diagnosis Date  . A-fib ([place])   . Allergic rhinitis   . Anxiety   . Chronic bronchitis ([place])   . CKD (chronic kidney disease), stage III ([place])   . Cornea scar   . Depression    unspecified  . Diverticulosis   . Hyperlipidemia   . Hypertension   .  Osteoporosis   . Risk for falls   . Situational disturbance   . TIA (transient ischemic attack)    Past Surgical History:  Procedure Laterality Date  . COLONOSCOPY  2016  . ESOPHAGUS SURGERY    . INTRAMEDULLARY (IM) NAIL INTERTROCHANTERIC Left 03/26/2017   Procedure: INTRAMEDULLARY (IM) NAIL INTERTROCHANTRIC;  Surgeon: Hessie Knows, MD;  Location: [facility] ORS;  Service: Orthopedics;  Laterality: Left;   Family History  Problem Relation Age of Onset  . Other Mother        Ureme[place] posioning  . Heart attack Father   . Prostate cancer Father   . Heart attack Brother   . Diabetes Brother   . Hypertension Daughter   . Kidney cancer Neg Hx   . Bladder Cancer Neg Hx   . Colon cancer Neg Hx   . Colon polyps Neg Hx   . Rectal cancer Neg Hx    Social History   Tobacco Use  . Smoking status: Former Smoker    Packs/day: 0.25    Years: 61.00    Pack years: 15.25    Types: Cigarettes    Last attempt to quit: 03/25/2017    Years since quitting: 0.3  .  Smokeless tobacco: Never Used  Substance Use Topics  . Alcohol use: No  . Drug use: No    Interim medical history since last visit reviewed. Allergies and medications reviewed  Review of Systems Per HPI unless specifically indicated above     Objective:    BP 130/70   Pulse 96   Temp (!) 97.3 F (36.3 C) (Oral)   Resp 16   Wt 150 lb 4.8 oz (68.2 kg)   SpO2 96%   BMI 23.54 kg/m   Wt Readings from Last 3 Encounters:  08/09/17 150 lb 4.8 oz (68.2 kg)  07/12/17 144 lb 12.8 oz (65.7 kg)  07/09/17 145 lb 6.4 oz (66 kg)    Physical Exam  Constitutional: She appears well-developed and well-nourished.  HENT:  Mouth/Throat: Mucous membranes are normal.  Eyes: EOM are normal. No scleral icterus.  Cardiovascular: Normal rate and regular rhythm.  Pulmonary/Chest: Effort normal and breath sounds normal.  Psychiatric: She has a normal mood and affect. Her behavior is normal.    Results for orders placed or performed in visit  on 08/09/17  POCT INR  Result Value Ref Range   INR 2.1       Assessment & Plan:   Problem List Items Addressed This Visit      Cardiovascular and Mediastinum   HTN (hypertension)    Controlled today; no medicines, controlling with diet      Relevant Medications   pravastatin (PRAVACHOL) 40 MG tablet   Atrial fibrillation (H[facility])    Continue coumadin for this and TIAs and carotid athero (per [place] doctor)      Relevant Medications   pravastatin (PRAVACHOL) 40 MG tablet     Musculoskeletal and Integument   Hip fracture (HCC)    Using cane; despite risk of falls, she wishes to continue the coumadin, discusse risk of head bleed        Other   Hypertriglyceridemia - Primary   Relevant Medications   pravastatin (PRAVACHOL) 40 MG tablet   Hyperlipidemia    Try to get LDL under 70; try to eat better, limit saturated fats; recheck lipids in 2-3 months during one of the INR check      Relevant Medications   pravastatin (PRAVACHOL) 40 MG tablet   Anxiety    Continue Paxil; advised benzo can increase the risk of falls; advised her to not take any more      Anticoagulation goal of INR 2 to 3    At goal today; consistency of diet discussed; return in one month      Relevant Orders   POCT INR (Completed)       Follow up plan: Return in about 1 month (around 09/09/2017) for follow-up visit with Dr. Sanda Klein.  An after-visit summary was printed and given to the patient at [facility].  Please see the patient instructions which may contain other information and recommendations beyond what is mentioned above in the assessment and plan.  No orders of the defined types were placed in this encounter.   Orders Placed This Encounter  Procedures  . POCT INR

## 2017-08-09 NOTE — Assessment & Plan Note (Signed)
Using cane; despite risk of falls, she wishes to continue the coumadin, discusse risk of head bleed

## 2017-08-09 NOTE — Assessment & Plan Note (Signed)
At goal today; consistency of diet discussed; return in one month

## 2017-08-09 NOTE — Assessment & Plan Note (Signed)
Continue Paxil; advised benzo can increase the risk of falls; advised her to not take any more

## 2017-08-23 DIAGNOSIS — N183 Chronic kidney disease, stage 3 (moderate): Secondary | ICD-10-CM | POA: Diagnosis not present

## 2017-08-23 DIAGNOSIS — I10 Essential (primary) hypertension: Secondary | ICD-10-CM | POA: Diagnosis not present

## 2017-08-23 DIAGNOSIS — N2581 Secondary hyperparathyroidism of renal origin: Secondary | ICD-10-CM | POA: Diagnosis not present

## 2017-08-31 ENCOUNTER — Encounter: Payer: Self-pay | Admitting: Family Medicine

## 2017-08-31 DIAGNOSIS — N183 Chronic kidney disease, stage 3 unspecified: Secondary | ICD-10-CM

## 2017-08-31 DIAGNOSIS — N2581 Secondary hyperparathyroidism of renal origin: Secondary | ICD-10-CM

## 2017-08-31 DIAGNOSIS — N1832 Chronic kidney disease, stage 3b: Secondary | ICD-10-CM | POA: Insufficient documentation

## 2017-08-31 HISTORY — DX: Chronic kidney disease, stage 3 unspecified: N18.30

## 2017-08-31 HISTORY — DX: Secondary hyperparathyroidism of renal origin: N25.81

## 2017-09-13 ENCOUNTER — Ambulatory Visit (INDEPENDENT_AMBULATORY_CARE_PROVIDER_SITE_OTHER): Payer: Medicare Other | Admitting: Family Medicine

## 2017-09-13 ENCOUNTER — Encounter: Payer: Self-pay | Admitting: Family Medicine

## 2017-09-13 VITALS — BP 120/70 | HR 89 | Temp 98.0°F | Resp 14 | Ht 67.5 in | Wt 150.3 lb

## 2017-09-13 DIAGNOSIS — R739 Hyperglycemia, unspecified: Secondary | ICD-10-CM | POA: Diagnosis not present

## 2017-09-13 DIAGNOSIS — Z5181 Encounter for therapeutic drug level monitoring: Secondary | ICD-10-CM

## 2017-09-13 DIAGNOSIS — R269 Unspecified abnormalities of gait and mobility: Secondary | ICD-10-CM | POA: Diagnosis not present

## 2017-09-13 DIAGNOSIS — N183 Chronic kidney disease, stage 3 unspecified: Secondary | ICD-10-CM

## 2017-09-13 DIAGNOSIS — I10 Essential (primary) hypertension: Secondary | ICD-10-CM | POA: Diagnosis not present

## 2017-09-13 DIAGNOSIS — I480 Paroxysmal atrial fibrillation: Secondary | ICD-10-CM | POA: Diagnosis not present

## 2017-09-13 DIAGNOSIS — E782 Mixed hyperlipidemia: Secondary | ICD-10-CM

## 2017-09-13 DIAGNOSIS — D7589 Other specified diseases of blood and blood-forming organs: Secondary | ICD-10-CM | POA: Diagnosis not present

## 2017-09-13 DIAGNOSIS — Z7901 Long term (current) use of anticoagulants: Secondary | ICD-10-CM

## 2017-09-13 LAB — POCT INR: INR: 1.8

## 2017-09-13 NOTE — Assessment & Plan Note (Signed)
Managed by cardiologist

## 2017-09-13 NOTE — Assessment & Plan Note (Signed)
Lifelong anticoagulation, goal INR 2 to 3

## 2017-09-13 NOTE — Assessment & Plan Note (Signed)
Reviewed last lipid panel; limit saturated fats 

## 2017-09-13 NOTE — Assessment & Plan Note (Signed)
Well controlled 

## 2017-09-13 NOTE — Assessment & Plan Note (Signed)
Check folic acid and vit K74

## 2017-09-13 NOTE — Progress Notes (Signed)
BP 120/70   Pulse 89   Temp 98 F (36.7 C) (Oral)   Resp 14   Ht 5' 7.5" (1.715 m)   Wt 150 lb 4.8 oz (68.2 kg)   SpO2 96%   BMI 23.19 kg/m    Subjective:    Patient ID: Christine Burnett, female    DOB: 02/16/1941, 77 y.o.   MRN: [id number]  HPI: Christine Burnett is a 77 y.o. female  Chief Complaint  Patient presents with  . Follow-up    INR    HPI Since last visit, no medical excitement Last INR was 2.1; no bleeding (nose, gums, rectum, urine) Few spots of easy bruising She is on lifelong anticoagulation for atrial fibrillation Today's INR was 1.8 She has been taking extra vitamin C and zinc, and she wonders if that affected her coumadin  She is taking 2 mg of coumadin every single day She would like to try stopping the vitamin C  Last lipid panel reviewed; total 166, HDL 50, TG 153, LDL 91; taking statin; not eating as much chocolate  Has a runny nose; not sure if allergies or cold; has been taking zinc and vitamin C  Her creatinine had climbed in October; she is seeing Dr. Holley Raring (nephrology) and was told her last kidney function was wonderful; avoiding NSAIDs  CBC reviewed; MCV and MCH were high; no alcohol; not sure if low on b12 or folate; her gait has been off since she broke her hip; uses a cane now  Depression screen [facility] 2/9 09/13/2017 08/09/2017 06/07/2017 05/08/2017 01/16/2017  Decreased Interest 0 0 0 0 0  Down, Depressed, Hopeless 0 0 0 0 0  PHQ - 2 Score 0 0 0 0 0   Relevant past medical, surgical, family and social history reviewed Past Medical History:  Diagnosis Date  . A-fib ([place])   . Allergic rhinitis   . Anxiety   . Chronic bronchitis ([place])   . Chronic kidney disease, stage III (moderate) ([place]) 08/31/2017   Seeing nephrologist  . CKD (chronic kidney disease), stage III ([place])   . Cornea scar   . Depression    unspecified  . Diverticulosis   . Hyperlipidemia   . 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 Hypertension   . Osteoporosis   . Risk for falls   . Situational disturbance   . TIA (transient ischemic attack)    Past Surgical History:  Procedure Laterality Date  . COLONOSCOPY  2016  . ESOPHAGUS SURGERY    . INTRAMEDULLARY (IM) NAIL INTERTROCHANTERIC Left 03/26/2017   Procedure: INTRAMEDULLARY (IM) NAIL INTERTROCHANTRIC;  Surgeon: Hessie Knows, MD;  Location: [facility] ORS;  Service: Orthopedics;  Laterality: Left;   Family History  Problem Relation Age of Onset  . Other Mother        [place] posioning  . Heart attack Father   . Prostate cancer Father   . Heart attack Brother   . Diabetes Brother   . Hypertension Daughter   . Kidney cancer Neg Hx   . Bladder Cancer Neg Hx   . Colon cancer Neg Hx   . Colon polyps Neg Hx   . Rectal cancer Neg Hx    Social History   Tobacco Use  . Smoking status: Former Smoker    Packs/day: 0.25    Years: 61.00    Pack years: 15.25    Types: Cigarettes    Last attempt to quit: 03/25/2017    Years  since quitting: 0.4  . Smokeless tobacco: Never Used  Substance Use Topics  . Alcohol use: No  . Drug use: No    Interim medical history since last visit reviewed. Allergies and medications reviewed  Review of Systems Per HPI unless specifically indicated above     Objective:    BP 120/70   Pulse 89   Temp 98 F (36.7 C) (Oral)   Resp 14   Ht 5' 7.5" (1.715 m)   Wt 150 lb 4.8 oz (68.2 kg)   SpO2 96%   BMI 23.19 kg/m   Wt Readings from Last 3 Encounters:  09/13/17 150 lb 4.8 oz (68.2 kg)  08/09/17 150 lb 4.8 oz (68.2 kg)  07/12/17 144 lb 12.8 oz (65.7 kg)    Physical Exam  Constitutional: She appears well-developed and well-nourished. No distress.  HENT:  Mouth/Throat: Mucous membranes are normal.  Eyes: EOM are normal. No scleral icterus.  Neck: No JVD present.  Cardiovascular: Normal rate and regular rhythm.  Sounds to be in regular rhythm today  Pulmonary/Chest: Effort normal and breath sounds  normal.  Musculoskeletal: She exhibits no edema.  Neurological: She is alert.  Just slightly unsteady gait, using cane for stability  Skin: Bruising (forearms) noted. No pallor.  Psychiatric: She has a normal mood and affect. Her behavior is normal.    Results for orders placed or performed in visit on 09/13/17  POCT INR  Result Value Ref Range   INR 1.8       Assessment & Plan:   Problem List Items Addressed This Visit      Cardiovascular and Mediastinum   HTN (hypertension)    Well-controlled      Atrial fibrillation ([place]) - Primary    Managed by cardiologist        Genitourinary   Chronic kidney disease, stage III (moderate) ([place])    Managed by nephrologist; avoid NSAIDs        Other   Macrocytosis    Check folic acid and vit H29      Relevant Orders   Vitamin B12   Folate   CBC with Differential/Platelet   Hyperlipidemia    Reviewed last lipid panel; limit saturated fats      Hyperglycemia    Reviewed her last several glucose readings; will check A1c today      Relevant Orders   Hemoglobin J2E   Basic Metabolic Panel (BMET)   Anticoagulation goal of INR 2 to 3    Lifelong anticoagulation, goal INR 2 to 3      Relevant Orders   POCT INR (Completed)    Other Visit Diagnoses    Abnormal gait       will check Q68 and folic acid today; continue use of cane for stability   Relevant Orders   Vitamin B12   Folate       Follow up plan: Return in about 2 weeks (around 09/27/2017) for follow-up visit with Dr. Sanda Klein just for INR.  An after-visit summary was printed and given to the patient at [place].  Please see the patient instructions which may contain other information and recommendations beyond what is mentioned above in the assessment and plan.  No orders of the defined types were placed in this encounter.   Orders Placed This Encounter  Procedures  . Hemoglobin A1c  . Vitamin B12  . Folate  . CBC with Differential/Platelet  . Basic  Metabolic Panel (BMET)  . POCT INR

## 2017-09-13 NOTE — Assessment & Plan Note (Signed)
Reviewed her last several glucose readings; will check A1c today

## 2017-09-13 NOTE — Assessment & Plan Note (Signed)
Managed by nephrologist; avoid NSAIDs

## 2017-09-13 NOTE — Patient Instructions (Addendum)
Try to limit saturated fats in your diet (bologna, hot dogs, barbeque, cheeseburgers, hamburgers, steak, bacon, sausage, cheese, etc.) and get more fresh fruits, vegetables, and whole grains Stop the vitamin C Avoid  Take 1 mg of coumadin extra today, then back to 2 mg daily Return in two weeks for next INR check We'll get labs today If you have not heard anything from my staff in a week about any orders/referrals/studies from today, please contact us here to follow-up [phone]

## 2017-09-14 LAB — BASIC METABOLIC PANEL
BUN/Creatinine Ratio: 14 (calc) (ref 6–22)
BUN: 20 mg/dL (ref 7–25)
CALCIUM: 9.5 mg/dL (ref 8.6–10.4)
CO2: 28 mmol/L (ref 20–32)
CREATININE: 1.4 mg/dL — AB (ref 0.60–0.93)
Chloride: 107 mmol/L (ref 98–110)
GLUCOSE: 87 mg/dL (ref 65–139)
POTASSIUM: 4 mmol/L (ref 3.5–5.3)
Sodium: 141 mmol/L (ref 135–146)

## 2017-09-14 LAB — CBC WITH DIFFERENTIAL/PLATELET
BASOS PCT: 0.9 %
Basophils Absolute: 50 cells/uL (ref 0–200)
EOS ABS: 292 {cells}/uL (ref 15–500)
Eosinophils Relative: 5.3 %
HCT: 37.9 % (ref 35.0–45.0)
HEMOGLOBIN: 12.8 g/dL (ref 11.7–15.5)
Lymphs Abs: 1562 cells/uL (ref 850–3900)
MCH: 32.5 pg (ref 27.0–33.0)
MCHC: 33.8 g/dL (ref 32.0–36.0)
MCV: 96.2 fL (ref 80.0–100.0)
MPV: 10.1 fL (ref 7.5–12.5)
Monocytes Relative: 7.7 %
NEUTROS ABS: 3174 {cells}/uL (ref 1500–7800)
Neutrophils Relative %: 57.7 %
Platelets: 218 10*3/uL (ref 140–400)
RBC: 3.94 10*6/uL (ref 3.80–5.10)
RDW: 13 % (ref 11.0–15.0)
Total Lymphocyte: 28.4 %
WBC: 5.5 10*3/uL (ref 3.8–10.8)
WBCMIX: 424 {cells}/uL (ref 200–950)

## 2017-09-14 LAB — FOLATE: Folate: 10.4 ng/mL

## 2017-09-14 LAB — HEMOGLOBIN A1C
EAG (MMOL/L): 6.3 (calc)
Hgb A1c MFr Bld: 5.6 % of total Hgb (ref <5.7)
Mean Plasma Glucose: 114 (calc)

## 2017-09-14 LAB — VITAMIN B12: VITAMIN B 12: 336 pg/mL (ref 200–1100)

## 2017-09-17 ENCOUNTER — Telehealth: Payer: Self-pay

## 2017-09-17 NOTE — Telephone Encounter (Signed)
Called pt no answer. LM informing pt of results. CRM created. Labs routed to [facility].

## 2017-09-17 NOTE — Telephone Encounter (Signed)
-----   Message from Arnetha Courser, MD sent at 09/14/2017  2:43 PM EDT ----- Guerry Minors, please let patient know that her A1c is better; her B12 is low, so start 500 mcg daily; kidney function better than last time; avoid NSAIDs and stay hydrated; thank you

## 2017-09-27 ENCOUNTER — Ambulatory Visit (INDEPENDENT_AMBULATORY_CARE_PROVIDER_SITE_OTHER): Payer: Medicare Other | Admitting: Family Medicine

## 2017-09-27 ENCOUNTER — Encounter: Payer: Self-pay | Admitting: Family Medicine

## 2017-09-27 VITALS — BP 130/84 | HR 85 | Temp 97.9°F | Ht 68.0 in | Wt 150.3 lb

## 2017-09-27 DIAGNOSIS — Z23 Encounter for immunization: Secondary | ICD-10-CM | POA: Insufficient documentation

## 2017-09-27 DIAGNOSIS — Z1389 Encounter for screening for other disorder: Secondary | ICD-10-CM | POA: Insufficient documentation

## 2017-09-27 DIAGNOSIS — I6529 Occlusion and stenosis of unspecified carotid artery: Secondary | ICD-10-CM | POA: Insufficient documentation

## 2017-09-27 DIAGNOSIS — R319 Hematuria, unspecified: Secondary | ICD-10-CM | POA: Insufficient documentation

## 2017-09-27 DIAGNOSIS — F4320 Adjustment disorder, unspecified: Secondary | ICD-10-CM | POA: Insufficient documentation

## 2017-09-27 DIAGNOSIS — I459 Conduction disorder, unspecified: Secondary | ICD-10-CM | POA: Insufficient documentation

## 2017-09-27 DIAGNOSIS — R059 Cough, unspecified: Secondary | ICD-10-CM | POA: Insufficient documentation

## 2017-09-27 DIAGNOSIS — G459 Transient cerebral ischemic attack, unspecified: Secondary | ICD-10-CM

## 2017-09-27 DIAGNOSIS — Z5181 Encounter for therapeutic drug level monitoring: Secondary | ICD-10-CM | POA: Diagnosis not present

## 2017-09-27 DIAGNOSIS — R002 Palpitations: Secondary | ICD-10-CM | POA: Insufficient documentation

## 2017-09-27 DIAGNOSIS — R05 Cough: Secondary | ICD-10-CM | POA: Insufficient documentation

## 2017-09-27 DIAGNOSIS — J309 Allergic rhinitis, unspecified: Secondary | ICD-10-CM | POA: Insufficient documentation

## 2017-09-27 DIAGNOSIS — K5790 Diverticulosis of intestine, part unspecified, without perforation or abscess without bleeding: Secondary | ICD-10-CM | POA: Insufficient documentation

## 2017-09-27 DIAGNOSIS — F17200 Nicotine dependence, unspecified, uncomplicated: Secondary | ICD-10-CM | POA: Insufficient documentation

## 2017-09-27 DIAGNOSIS — H179 Unspecified corneal scar and opacity: Secondary | ICD-10-CM | POA: Insufficient documentation

## 2017-09-27 DIAGNOSIS — Z9229 Personal history of other drug therapy: Secondary | ICD-10-CM | POA: Insufficient documentation

## 2017-09-27 DIAGNOSIS — I480 Paroxysmal atrial fibrillation: Secondary | ICD-10-CM | POA: Diagnosis not present

## 2017-09-27 DIAGNOSIS — Z7901 Long term (current) use of anticoagulants: Secondary | ICD-10-CM | POA: Diagnosis not present

## 2017-09-27 DIAGNOSIS — M25569 Pain in unspecified knee: Secondary | ICD-10-CM | POA: Insufficient documentation

## 2017-09-27 DIAGNOSIS — M791 Myalgia, unspecified site: Secondary | ICD-10-CM | POA: Insufficient documentation

## 2017-09-27 DIAGNOSIS — Z9181 History of falling: Secondary | ICD-10-CM | POA: Insufficient documentation

## 2017-09-27 DIAGNOSIS — R0781 Pleurodynia: Secondary | ICD-10-CM | POA: Insufficient documentation

## 2017-09-27 DIAGNOSIS — R21 Rash and other nonspecific skin eruption: Secondary | ICD-10-CM | POA: Insufficient documentation

## 2017-09-27 DIAGNOSIS — R928 Other abnormal and inconclusive findings on diagnostic imaging of breast: Secondary | ICD-10-CM | POA: Insufficient documentation

## 2017-09-27 DIAGNOSIS — R198 Other specified symptoms and signs involving the digestive system and abdomen: Secondary | ICD-10-CM | POA: Insufficient documentation

## 2017-09-27 DIAGNOSIS — R079 Chest pain, unspecified: Secondary | ICD-10-CM | POA: Insufficient documentation

## 2017-09-27 LAB — POCT INR: INR: 1.8

## 2017-09-27 MED ORDER — ACETIC ACID 2 % OT SOLN: 4.0000 [drp] | Freq: Four times a day (QID) | OTIC | 0 refills | Status: DC

## 2017-09-27 NOTE — Patient Instructions (Addendum)
Take 3 mg on Mondays and Thursdays Take 2 mg on all other days Return in 2 weeks for next INR check, call sooner if any bleeding  [facility] Address: [address], [place], [place] [address]  Phone: [phone]

## 2017-09-27 NOTE — Progress Notes (Signed)
BP 130/84 (BP Location: Right Arm, Patient Position: Sitting, Cuff Size: Normal)   Pulse 85   Temp 97.9 F (36.6 C) (Oral)   Ht 5\' 8"  (1.727 m)   Wt 150 lb 4.8 oz (68.2 kg)   SpO2 92%   BMI 22.85 kg/m    Subjective:    Patient ID: Christine Burnett, female    DOB: [birth year], 77 y.o.   MRN: [id number]  HPI: Christine Burnett is a 77 y.o. female  Chief Complaint  Patient presents with  . Coagulation Disorder    1.8    HPI Patient here for INR check Last INR was 1.8 Today's is 1.8 She just took an extra half pill that one day, then back to normal Normal dose: 2 mg once a day before bed Eating more chocolate Back in December, her INR was 6.1; was on other meds, in rehab No blood in urine or stool Vitamin B12 pills; stopped the B12 for the most part, takes just once in a while Vitamin D gives her more energy than the B12 She wonders if it is helping her see better; kidney may be working better too Lots of itching in the right ear, just wants to scratch; no change in hearing; no drainage; no sore throat; no fevers  Depression screen [facility] 2/9 09/27/2017 09/13/2017 08/09/2017 06/07/2017 05/08/2017  Decreased Interest 0 0 0 0 0  Down, Depressed, Hopeless 0 0 0 0 0  PHQ - 2 Score 0 0 0 0 0   Relevant past medical, surgical, family and social history reviewed Past Medical History:  Diagnosis Date  . A-fib ([place])   . Allergic rhinitis   . Anxiety   . Chronic bronchitis ([place])   . Chronic kidney disease, stage III (moderate) ([place]) 08/31/2017   Seeing nephrologist  . CKD (chronic kidney disease), stage III ([place])   . Cornea scar   . Depression    unspecified  . Diverticulosis   . Hyperlipidemia   . 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 Hypertension   . Osteoporosis   . Risk for falls   . Situational disturbance   . TIA (transient ischemic attack)    Past Surgical History:  Procedure Laterality Date  . COLONOSCOPY  2016  .  ESOPHAGUS SURGERY    . INTRAMEDULLARY (IM) NAIL INTERTROCHANTERIC Left 03/26/2017   Procedure: INTRAMEDULLARY (IM) NAIL INTERTROCHANTRIC;  Surgeon: Hessie Knows, MD;  Location: [facility] ORS;  Service: Orthopedics;  Laterality: Left;   Family History  Problem Relation Age of Onset  . Other Mother        Uremeic posioning  . Heart attack Father   . Prostate cancer Father   . Heart attack Brother   . Diabetes Brother   . Hypertension Daughter   . Kidney cancer Neg Hx   . Bladder Cancer Neg Hx   . Colon cancer Neg Hx   . Colon polyps Neg Hx   . Rectal cancer Neg Hx    Social History   Tobacco Use  . Smoking status: Former Smoker    Packs/day: 0.25    Years: 61.00    Pack years: 15.25    Types: Cigarettes    Last attempt to quit: 03/25/2017    Years since quitting: 0.5  . Smokeless tobacco: Never Used  Substance Use Topics  . Alcohol use: No  . Drug use: No    Interim medical history since last visit reviewed. Allergies and medications reviewed  Review of Systems Per HPI unless specifically indicated above     Objective:    BP 130/84 (BP Location: Right Arm, Patient Position: Sitting, Cuff Size: Normal)   Pulse 85   Temp 97.9 F (36.6 C) (Oral)   Ht 5\' 8"  (1.727 m)   Wt 150 lb 4.8 oz (68.2 kg)   SpO2 92%   BMI 22.85 kg/m   Wt Readings from Last 3 Encounters:  09/27/17 150 lb 4.8 oz (68.2 kg)  09/13/17 150 lb 4.8 oz (68.2 kg)  08/09/17 150 lb 4.8 oz (68.2 kg)    Physical Exam  Constitutional: She appears well-developed and well-nourished.  HENT:  Mouth/Throat: Mucous membranes are normal.  Eyes: EOM are normal. No scleral icterus.  Cardiovascular: Normal rate and regular rhythm.  Pulmonary/Chest: Effort normal and breath sounds normal.  Psychiatric: She has a normal mood and affect. Her behavior is normal.    Results for orders placed or performed in visit on 09/27/17  POCT INR  Result Value Ref Range   INR 1.8       Assessment & Plan:   Problem List  Items Addressed This Visit      Cardiovascular and Mediastinum   TIA (transient ischemic attack)   Relevant Medications   warfarin (COUMADIN) 2 MG tablet   Atrial fibrillation (HCC)    On coumadin, goal INR 2 to 3      Relevant Medications   warfarin (COUMADIN) 2 MG tablet     Other   Anticoagulation goal of INR 2 to 3 - Primary    Adjust dose to get INR between 2 and 3; return in 2 weeks for recheck INR      Relevant Medications   warfarin (COUMADIN) 2 MG tablet   Other Relevant Orders   POCT INR (Completed)       Follow up plan: Return in about 2 weeks (around 10/11/2017) for INR check with Dr. Sanda Klein.  An after-visit summary was printed and given to the patient at [place].  Please see the patient instructions which may contain other information and recommendations beyond what is mentioned above in the assessment and plan.  Meds ordered this encounter  Medications  . acetic acid 2 % otic solution    Sig: Place 4 drops into the right ear 4 (four) times daily. While awake    Dispense:  15 mL    Refill:  0    Orders Placed This Encounter  Procedures  . POCT INR

## 2017-10-07 NOTE — Assessment & Plan Note (Signed)
On coumadin, goal INR 2 to 3

## 2017-10-07 NOTE — Assessment & Plan Note (Signed)
Adjust dose to get INR between 2 and 3; return in 2 weeks for recheck INR

## 2017-10-08 ENCOUNTER — Ambulatory Visit (INDEPENDENT_AMBULATORY_CARE_PROVIDER_SITE_OTHER): Payer: Medicare Other | Admitting: Nurse Practitioner

## 2017-10-08 ENCOUNTER — Encounter: Payer: Self-pay | Admitting: Nurse Practitioner

## 2017-10-08 VITALS — BP 126/82 | HR 100 | Temp 98.4°F | Resp 16 | Ht 68.0 in | Wt 151.1 lb

## 2017-10-08 DIAGNOSIS — Z7901 Long term (current) use of anticoagulants: Secondary | ICD-10-CM | POA: Diagnosis not present

## 2017-10-08 DIAGNOSIS — I4891 Unspecified atrial fibrillation: Secondary | ICD-10-CM | POA: Diagnosis not present

## 2017-10-08 DIAGNOSIS — G459 Transient cerebral ischemic attack, unspecified: Secondary | ICD-10-CM | POA: Diagnosis not present

## 2017-10-08 DIAGNOSIS — Z5181 Encounter for therapeutic drug level monitoring: Secondary | ICD-10-CM | POA: Diagnosis not present

## 2017-10-08 LAB — POCT INR: INR: 2.4

## 2017-10-08 NOTE — Progress Notes (Addendum)
Name: Christine Burnett   MRN: [id number]    DOB: May 10, 1941   Date:10/08/2017       Progress Note  Subjective  Chief Complaint  Chief Complaint  Patient presents with  . Follow-up    2 weeks recheck    HPI  Anticoagulation  Patient has hx of TIA and Afib and is on warfarin for anticoagulation with goal INR 2-3. Last checked on 4/25 was suboptimal at 1.8. Patient was taking 2mg  once a day. Last INR was 1.8. Was adjusted to taking 2mg  daily except on Sunday and Thursday she was taking 3mg . Today we are at goal. Endorses no new bruising, no bleeding gums, vaginal bleeding, blood in stools. Denies changes in diet.   Lab Results  Component Value Date   INR 2.4 10/08/2017   INR 1.8 09/27/2017   INR 1.8 09/13/2017    Patient Active Problem List   Diagnosis Date Noted  . Chest pain 09/27/2017  . Abnormal finding on mammography 09/27/2017  . Adaptation reaction 09/27/2017  . Allergic rhinitis 09/27/2017  . Altered bowel function 09/27/2017  . At risk for falling 09/27/2017  . Awareness of heartbeats 09/27/2017  . Blood in the urine 09/27/2017  . Cardiac conduction disorder 09/27/2017  . Carotid artery plaque 09/27/2017  . Compulsive tobacco user syndrome 09/27/2017  . Cornea scar 09/27/2017  . Cutaneous eruption 09/27/2017  . 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 Muscle ache 09/27/2017  . Pain in rib 09/27/2017  . Macrocytosis 09/13/2017  . Chronic kidney disease, stage III (moderate) ([place]) 08/31/2017  . 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 Atrial fibrillation ([place]) 08/09/2017  . 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 Hip fracture ([place]) 03/25/2017  . Hypertriglyceridemia 11/10/2015  . Hyperglycemia 11/10/2015  . Cardiac murmur 04/07/2015  . Abdominal pain 02/10/2015  . 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 stage 4 chronic kidney disease, or unspecified chronic kidney disease  02/10/2015  . Long term current use of anticoagulant therapy 02/10/2015  . Pelvic pressure in female 01/14/2015  . Feeling tired 01/14/2015  . Anxiety 01/05/2015  . Hyperlipidemia 01/05/2015  . Anticoagulation goal of INR 2 to 3 11/16/2014  . HTN (hypertension) 09/24/2014  . TIA (transient ischemic attack) 09/24/2014    Past Medical History:  Diagnosis Date  . A-fib ([place])   . Allergic rhinitis   . Anxiety   . Chronic bronchitis ([place])   . Chronic kidney disease, stage III (moderate) ([place]) 08/31/2017   Seeing nephrologist  . CKD (chronic kidney disease), stage III ([place])   . Cornea scar   . Depression    unspecified  . Diverticulosis   . Hyperlipidemia   . 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 Hypertension   . Osteoporosis   . Risk for falls   . Situational disturbance   . TIA (transient ischemic attack)     Past Surgical History:  Procedure Laterality Date  . COLONOSCOPY  2016  . ESOPHAGUS SURGERY    . INTRAMEDULLARY (IM) NAIL INTERTROCHANTERIC Left 03/26/2017   Procedure: INTRAMEDULLARY (IM) NAIL INTERTROCHANTRIC;  Surgeon: Hessie Knows, MD;  Location: [facility] ORS;  Service: Orthopedics;  Laterality: Left;    Social History   Tobacco Use  . Smoking status: Former Smoker    Packs/day: 0.25    Years: 61.00    Pack years: 15.25    Types: Cigarettes    Last attempt to quit: 03/25/2017    Years  since quitting: 0.5  . Smokeless tobacco: Never Used  Substance Use Topics  . Alcohol use: No     Current Outpatient Medications:  .  acetaminophen (TYLENOL) 325 MG tablet, Take 1 tablet (325 mg total) by mouth at bedtime as needed., Disp: , Rfl:  .  acetic acid 2 % otic solution, Place 4 drops into the right ear 4 (four) times daily. While awake, Disp: 15 mL, Rfl: 0 .  Ascorbic Acid (VITAMIN C PO), Take 1 tablet by mouth daily., Disp: , Rfl:  .  Cholecalciferol (VITAMIN D-3) 1000 units CAPS, Take 1 capsule (1,000 Units total) by  mouth daily., Disp: , Rfl:  .  Homeopathic Products (ZINC COLD THERAPY PO), Take 1 tablet by mouth daily as needed., Disp: , Rfl:  .  PARoxetine (PAXIL) 30 MG tablet, Take 1 tablet (30 mg total) by mouth daily., Disp: 90 tablet, Rfl: 0 .  pravastatin (PRAVACHOL) 40 MG tablet, Take 1 tablet (40 mg total) by mouth at bedtime., Disp: , Rfl:  .  warfarin (COUMADIN) 2 MG tablet, Take 3 mg on Mondays and Thursdays, and 2 mg daily all other days, Disp: , Rfl:  .  albuterol (PROVENTIL HFA;VENTOLIN HFA) 108 (90 Base) MCG/ACT inhaler, Inhale 2 puffs into the lungs every 6 (six) hours as needed for wheezing or shortness of breath. (Patient not taking: Reported on 09/13/2017), Disp: 1 Inhaler, Rfl: 2  Allergies  Allergen Reactions  . Cefuroxime Axetil     caused rash.  . Ciprofloxacin     Unknown  . Codeine     ROS   No other specific complaints in a complete review of systems (except as listed in HPI above).  Objective  Vitals:   10/08/17 1334  BP: 126/82  Pulse: 100  Resp: 16  Temp: 98.4 F (36.9 C)  TempSrc: Oral  SpO2: 94%  Weight: 151 lb 1.6 oz (68.5 kg)  Height: 5\' 8"  (1.727 m)     Body mass index is 22.97 kg/m.  Nursing Note and Vital Signs reviewed.  Physical Exam   Constitutional: Patient appears well-developed and well-nourished.  No distress.  Cardiovascular: Normal rate, regular rhythm, S1/S2 present.   Pulmonary/Chest: Effort normal and breath sounds clear. No respiratory distress or retractions. Psychiatric: Patient has a normal mood and affect. behavior is normal. Judgment and thought content normal.  No results found for this or any previous visit (from the past 72 hour(s)).  Assessment & Plan  1. Atrial fibrillation, unspecified type (HCC) - rate controlled, regular rhythm auscultated  - POCT INR  2. TIA (transient ischemic attack) - no symptoms on coumadin for prevention  - POCT INR  3. Anticoagulation goal of INR 2 to 3 Goal continue 2mg  daily with  3mg  on Sunday and thursdays. Follow up in one month, monitor for bleeding and come in sooner if needed.  - POCT INR    -Red flags and when to present for emergency care or RTC including fever >101.71F, chest pain, shortness of breath, new/worsening/un-resolving symptoms, uncontrolled bleeding reviewed with patient at time of visit. Follow up and care instructions discussed and provided in AVS.  ---------------------------------------- I have reviewed this encounter including the documentation in this note and/or discussed this patient with the provider, Suezanne Cheshire DNP AGNP-C. I am certifying that I agree with the content of this note as supervising physician. Enid Derry, [facility] 10/10/2017, 5:11 PM

## 2017-10-08 NOTE — Progress Notes (Signed)
24

## 2017-10-08 NOTE — Patient Instructions (Signed)
- Continue 2mg  daily with additional 1mg  on Sundays and Thursday.   Bleeding Precautions When on Anticoagulant Therapy WHAT IS ANTICOAGULANT THERAPY? Anticoagulant therapy is taking medicine to prevent or reduce blood clots. It is also called blood thinner therapy. Blood clots that form in your blood vessels can be dangerous. They can break loose and travel to your heart, lungs, or brain. This increases your risk of a heart attack or stroke. Anticoagulant therapy causes blood to clot more slowly. You may need anticoagulant therapy if you have:  A medical condition that increases the likelihood that blood clots will form.  A heart defect or a problem with heart rhythm. It is also a common treatment after heart surgery, such as valve replacement. WHAT ARE COMMON TYPES OF ANTICOAGULANT THERAPY? Anticoagulant medicine can be injected or taken by mouth.If you need anticoagulant therapy quickly at the hospital, the medicine may be injected under your skin or given through an IV tube. Heparin is a common example of an anticoagulant that you may get at the hospital. Most anticoagulant therapy is in the form of pills that you take at home every day. These may include:  Aspirin. This common blood thinner works by preventing blood cells (platelets) from sticking together to form a clot. Aspirin is not as strong as anticoagulants that slow down the time that it takes for your body to form a clot.  Clopidogrel. This is a newer type of drug that affects platelets. It is stronger than aspirin.  Warfarin. This is the most common anticoagulant. It changes the way your body uses vitamin K, a vitamin that helps your blood to clot. The risk of bleeding is higher with warfarin than with aspirin. You will need frequent blood tests to make sure you are taking the safest amount.  New anticoagulants. Several new drugs have been approved. They are all taken by mouth. Studies show that these drugs work as well as  warfarin. They do not require blood testing. They may cause less bleeding risk than warfarin. WHAT DO I NEED TO REMEMBER WHEN TAKING ANTICOAGULANT THERAPY? Anticoagulant therapy decreases your risk of forming a blood clot, but it increases your risk of bleeding. Work closely with your health care provider to make sure you are taking your medicine safely. These tips can help:  Learn ways to reduce your risk of bleeding.  If you are taking warfarin: ? Have blood tests as ordered by your health care provider. ? Do not make any sudden changes to your diet. Vitamin K in your diet can make warfarin less effective. ? Do not get pregnant. This medicine may cause birth defects.  Take your medicine at the same time every day. If you forget to take your medicine, take it as soon as you remember. If you miss a whole day, do not double your dose of medicine. Take your normal dose and call your health care provider to check in.  Do not stop taking your medicine on your own.  Tell your health care provider before you start taking any new medicine, vitamin, or herbal product. Some of these could interfere with your therapy.  Tell all of your health care providers that you are on anticoagulant therapy.  Do not have surgery, medical procedures, or dental work until you tell your health care provider that you are on anticoagulant therapy. WHAT CAN AFFECT HOW ANTICOAGULANTS WORK? Certain foods, vitamins, medicines, supplements, and herbal medicines change the way that anticoagulant therapy works. They may increase or decrease the  effects of your anticoagulant therapy. Either result can be dangerous for you.  Many over-the-counter medicines for pain, colds, or stomach problems interfere with anticoagulant therapy. Take these only as told by your health care provider.  Do not drink alcohol. It can interfere with your medicine and increase your risk of an injury that causes bleeding.  If you are taking warfarin,  do not begin eating more foods that contain vitamin K. These include leafy green vegetables. Ask your health care provider if you should avoid any foods. WHAT ARE SOME WAYS TO PREVENT BLEEDING? You can prevent bleeding by taking certain precautions:  Be extra careful when you use knives, scissors, or other sharp objects.  Use an electric razor instead of a blade.  Do not use toothpicks.  Use a soft toothbrush.  Wear shoes that have nonskid soles.  Use bath mats and handrails in your bathroom.  Wear gloves while you do yard work.  Wear a helmet when you ride a bike.  Wear your seat belt.  Prevent falls by removing loose rugs and extension cords from areas where you walk.  Do not play contact sports or participate in other activities that have a high risk of injury. [place] PROVIDER? Call your health care provider if:  You miss a dose of medicine: ? And you are not sure what to do. ? For more than one day.  You have: ? Menstrual bleeding that is heavier than normal. ? Blood in your urine. ? A bloody nose or bleeding gums. ? Easy bruising. ? Blood in your stool (feces) or have black and tarry stool. ? Side effects from your medicine.  You feel weak or dizzy.  You become pregnant. Seek immediate medical care if:  You have bleeding that will not stop.  You have sudden and severe headache or belly pain.  You vomit or you cough up bright red blood.  You have a severe blow to your head. WHAT ARE SOME QUESTIONS TO ASK MY HEALTH CARE PROVIDER?  What is the best anticoagulant therapy for my condition?  What side effects should I watch for?  When should I take my medicine? What should I do if I forget to take it?  Will I need to have regular blood tests?  Do I need to change my diet? Are there foods or drinks that I should avoid?  What activities are safe for me?  What should I do if I want to get pregnant? This information is not  intended to replace advice given to you by your health care provider. Make sure you discuss any questions you have with your health care provider. Document Released: 05/03/2015 Document Reviewed: 05/03/2015 Elsevier Interactive Patient Education  2017 [employer].

## 2017-10-22 ENCOUNTER — Other Ambulatory Visit: Payer: Self-pay

## 2017-10-22 DIAGNOSIS — F419 Anxiety disorder, unspecified: Secondary | ICD-10-CM

## 2017-10-22 DIAGNOSIS — E781 Pure hyperglyceridemia: Secondary | ICD-10-CM

## 2017-10-22 MED ORDER — PRAVASTATIN SODIUM 40 MG PO TABS: 40.0000 mg | ORAL_TABLET | Freq: Every day | ORAL | 1 refills | Status: DC

## 2017-10-22 MED ORDER — PAROXETINE HCL 30 MG PO TABS: 30.0000 mg | ORAL_TABLET | Freq: Every day | ORAL | 1 refills | Status: DC

## 2017-10-22 NOTE — Telephone Encounter (Signed)
Last lipids and sgpt reviewed; Rx approved

## 2017-10-25 ENCOUNTER — Ambulatory Visit: Payer: Medicare Other | Admitting: Family Medicine

## 2017-11-08 ENCOUNTER — Ambulatory Visit (INDEPENDENT_AMBULATORY_CARE_PROVIDER_SITE_OTHER): Payer: Medicare Other | Admitting: Family Medicine

## 2017-11-08 ENCOUNTER — Encounter: Payer: Self-pay | Admitting: Family Medicine

## 2017-11-08 VITALS — BP 102/64 | HR 97 | Temp 97.8°F | Resp 12 | Ht 68.0 in | Wt 152.3 lb

## 2017-11-08 DIAGNOSIS — I480 Paroxysmal atrial fibrillation: Secondary | ICD-10-CM | POA: Diagnosis not present

## 2017-11-08 DIAGNOSIS — Z7901 Long term (current) use of anticoagulants: Secondary | ICD-10-CM | POA: Diagnosis not present

## 2017-11-08 DIAGNOSIS — Z23 Encounter for immunization: Secondary | ICD-10-CM | POA: Diagnosis not present

## 2017-11-08 DIAGNOSIS — Z5181 Encounter for therapeutic drug level monitoring: Secondary | ICD-10-CM | POA: Diagnosis not present

## 2017-11-08 LAB — POCT INR: INR: 2 (ref 2.0–3.0)

## 2017-11-08 NOTE — Assessment & Plan Note (Signed)
Continue coumadin at current dose; return in one month for recheck; stable diet encouraged

## 2017-11-08 NOTE — Progress Notes (Signed)
BP 102/64   Pulse 97   Temp 97.8 F (36.6 C) (Oral)   Resp 12   Ht 5\' 8"  (1.727 m)   Wt 152 lb 4.8 oz (69.1 kg)   SpO2 97%   BMI 23.16 kg/m    Subjective:    Patient ID: Christine Burnett, female    DOB: 1940-07-24, 77 y.o.   MRN: [id number]  HPI: Christine Burnett is a 77 y.o. female  Chief Complaint  Patient presents with  . Follow-up  . Anticoagulation    2mg  daily INR:2.0    HPI Patient is here for her INR check Chronic anticoagulation for atrial fibrllation No bleeding from gums, nose, urine, or stool Last month's INR 2.4; prior month was 1.8 She wishes to leave it where it is No change in diet No change in medicines No missed doses  Depression screen [facility] 2/9 11/08/2017 09/27/2017 09/13/2017 08/09/2017 06/07/2017  Decreased Interest 0 0 0 0 0  Down, Depressed, Hopeless 0 0 0 0 0  PHQ - 2 Score 0 0 0 0 0  Altered sleeping 0 - - - -  Tired, decreased energy 0 - - - -  Change in appetite 0 - - - -  Feeling bad or failure about yourself  0 - - - -  Trouble concentrating 0 - - - -  Moving slowly or fidgety/restless 0 - - - -  Suicidal thoughts 0 - - - -  PHQ-9 Score 0 - - - -  Difficult doing work/chores Not difficult at all - - - -    Relevant past medical, surgical, family and social history reviewed Past Medical History:  Diagnosis Date  . A-fib ([place])   . Allergic rhinitis   . Anxiety   . Chronic bronchitis ([place])   . Chronic kidney disease, stage III (moderate) ([place]) 08/31/2017   Seeing nephrologist  . CKD (chronic kidney disease), stage III ([place])   . Cornea scar   . Depression    unspecified  . Diverticulosis   . Hyperlipidemia   . 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 Hypertension   . Osteoporosis   . Risk for falls   . Situational disturbance   . TIA (transient ischemic attack)    Past Surgical History:  Procedure Laterality Date  . COLONOSCOPY  2016  . ESOPHAGUS SURGERY    . INTRAMEDULLARY (IM)  NAIL INTERTROCHANTERIC Left 03/26/2017   Procedure: INTRAMEDULLARY (IM) NAIL INTERTROCHANTRIC;  Surgeon: Hessie Knows, MD;  Location: [facility] ORS;  Service: Orthopedics;  Laterality: Left;   Family History  Problem Relation Age of Onset  . Other Mother        [place] posioning  . Heart attack Father   . Prostate cancer Father   . Heart attack Brother   . Diabetes Brother   . Hypertension Daughter   . Kidney cancer Neg Hx   . Bladder Cancer Neg Hx   . Colon cancer Neg Hx   . Colon polyps Neg Hx   . Rectal cancer Neg Hx    Social History   Tobacco Use  . Smoking status: Former Smoker    Packs/day: 0.25    Years: 61.00    Pack years: 15.25    Types: Cigarettes    Last attempt to quit: 03/25/2017    Years since quitting: 0.6  . Smokeless tobacco: Never Used  Substance Use Topics  . Alcohol use: No  . Drug use: No  Interim medical history since last visit reviewed. Allergies and medications reviewed  Review of Systems Per HPI unless specifically indicated above     Objective:    BP 102/64   Pulse 97   Temp 97.8 F (36.6 C) (Oral)   Resp 12   Ht 5\' 8"  (1.727 m)   Wt 152 lb 4.8 oz (69.1 kg)   SpO2 97%   BMI 23.16 kg/m   Wt Readings from Last 3 Encounters:  11/08/17 152 lb 4.8 oz (69.1 kg)  10/08/17 151 lb 1.6 oz (68.5 kg)  09/27/17 150 lb 4.8 oz (68.2 kg)    Physical Exam  Constitutional: She appears well-developed and well-nourished.  HENT:  Mouth/Throat: Mucous membranes are normal.  Eyes: EOM are normal. No scleral icterus.  Cardiovascular: Normal rate and regular rhythm.  Pulmonary/Chest: Effort normal and breath sounds normal.  Skin: No bruising, no ecchymosis and no petechiae noted.  Psychiatric: She has a normal mood and affect. Her mood appears not anxious. She does not exhibit a depressed mood.    Results for orders placed or performed in visit on 11/08/17  POCT INR  Result Value Ref Range   INR 2.0 2.0 - 3.0      Assessment & Plan:    Problem List Items Addressed This Visit      Cardiovascular and Mediastinum   Atrial fibrillation ([place]) - Primary    Continue coumadin at current dose; return in one month for recheck; stable diet encouraged        Other   Anticoagulation goal of INR 2 to 3    At target today      Relevant Orders   POCT INR (Completed)    Other Visit Diagnoses    Need for vaccination with 13-polyvalent pneumococcal conjugate vaccine       Relevant Orders   Pneumococcal conjugate vaccine 13-valent IM (Completed)       Follow up plan: Return in about 1 month (around 12/08/2017).  An after-visit summary was printed and given to the patient at [place].  Please see the patient instructions which may contain other information and recommendations beyond what is mentioned above in the assessment and plan.  No orders of the defined types were placed in this encounter.   Orders Placed This Encounter  Procedures  . Pneumococcal conjugate vaccine 13-valent IM  . POCT INR

## 2017-11-08 NOTE — Patient Instructions (Signed)
You have received the Prevnar vaccine (PCV-13) and you will not need another booster of this for the rest of your life per current ACIP guidelines You will need a Pneumovax (PPSV-23) in one year and that will be your final pneumonia vaccine for the rest of your life

## 2017-11-08 NOTE — Assessment & Plan Note (Signed)
At target today

## 2017-12-10 ENCOUNTER — Encounter: Payer: Self-pay | Admitting: Family Medicine

## 2017-12-10 ENCOUNTER — Ambulatory Visit (INDEPENDENT_AMBULATORY_CARE_PROVIDER_SITE_OTHER): Payer: Medicare Other | Admitting: Family Medicine

## 2017-12-10 VITALS — BP 108/62 | HR 99 | Temp 97.9°F | Resp 14 | Ht 68.0 in | Wt 151.4 lb

## 2017-12-10 DIAGNOSIS — Z5181 Encounter for therapeutic drug level monitoring: Secondary | ICD-10-CM

## 2017-12-10 DIAGNOSIS — R011 Cardiac murmur, unspecified: Secondary | ICD-10-CM

## 2017-12-10 DIAGNOSIS — G459 Transient cerebral ischemic attack, unspecified: Secondary | ICD-10-CM | POA: Diagnosis not present

## 2017-12-10 DIAGNOSIS — Z7901 Long term (current) use of anticoagulants: Secondary | ICD-10-CM | POA: Diagnosis not present

## 2017-12-10 DIAGNOSIS — I4891 Unspecified atrial fibrillation: Secondary | ICD-10-CM

## 2017-12-10 LAB — POCT INR: INR: 2 (ref 2.0–3.0)

## 2017-12-10 MED ORDER — WARFARIN SODIUM 2 MG PO TABS: ORAL_TABLET | ORAL | 5 refills | Status: DC

## 2017-12-10 NOTE — Assessment & Plan Note (Signed)
Stable today, in NSR; continue anticoagulation; no chest pain, no bleeding on coumadin

## 2017-12-10 NOTE — Progress Notes (Signed)
BP 108/62   Pulse 99   Temp 97.9 F (36.6 C) (Oral)   Resp 14   Ht 5\' 8"  (1.727 m)   Wt 151 lb 6.4 oz (68.7 kg)   SpO2 96%   BMI 23.02 kg/m    Subjective:    Patient ID: Christine Burnett, female    DOB: Sep 08, 1940, 77 y.o.   MRN: [id number]  HPI: Christine Burnett is a 77 y.o. female  Chief Complaint  Patient presents with  . Anticoagulation    INR: 2.0    HPI Patient is here for anticoagulation management On coumadin lifelong for atrial fibrillation Goal INR 2-3 Currently taking coumadin; no bleeding Taking at bedtime, one pill every single day; not missing a dose Today's INR is 2 Last month 2.0, prior 2.4, prior two were both 1.8  CKD; Creatinine 1.4; improved from last October High cholesterol; total chol 166, HDL 50; 10 year ASCVD risk was 14.4%; no aches with the statin Murmur noted on exam; she says she has never seen a cardiologist; she had an echo done in the fall 2018  Depression screen [facility] 2/9 11/08/2017 09/27/2017 09/13/2017 08/09/2017 06/07/2017  Decreased Interest 0 0 0 0 0  Down, Depressed, Hopeless 0 0 0 0 0  PHQ - 2 Score 0 0 0 0 0  Altered sleeping 0 - - - -  Tired, decreased energy 0 - - - -  Change in appetite 0 - - - -  Feeling bad or failure about yourself  0 - - - -  Trouble concentrating 0 - - - -  Moving slowly or fidgety/restless 0 - - - -  Suicidal thoughts 0 - - - -  PHQ-9 Score 0 - - - -  Difficult doing work/chores Not difficult at all - - - -   Fall Risk  11/08/2017 09/27/2017 09/13/2017 08/09/2017 06/07/2017  Falls in the past year? No No No Yes No  Number falls in past yr: - - - 1 -  Injury with Fall? - - - Yes No  Comment - - - broke left hip -  Risk Factor Category  - - - - High Fall Risk  Risk for fall due to : - - - - -    Relevant past medical, surgical, family and social history reviewed Past Medical History:  Diagnosis Date  . A-fib ([place])   . Allergic rhinitis   . Anxiety   . Chronic bronchitis ([place])   . Chronic kidney  disease, stage III (moderate) ([place]) 08/31/2017   Seeing nephrologist  . CKD (chronic kidney disease), stage III ([place])   . Cornea scar   . Depression    unspecified  . Diverticulosis   . Hyperlipidemia   . 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 Hypertension   . Osteoporosis   . Risk for falls   . Situational disturbance   . TIA (transient ischemic attack)    Past Surgical History:  Procedure Laterality Date  . COLONOSCOPY  2016  . ESOPHAGUS SURGERY    . INTRAMEDULLARY (IM) NAIL INTERTROCHANTERIC Left 03/26/2017   Procedure: INTRAMEDULLARY (IM) NAIL INTERTROCHANTRIC;  Surgeon: Hessie Knows, MD;  Location: [facility] ORS;  Service: Orthopedics;  Laterality: Left;   Family History  Problem Relation Age of Onset  . Other Mother        Uremeic posioning  . Heart attack Father   . Prostate cancer Father   . Heart attack Brother   .  Diabetes Brother   . Hypertension Daughter   . Kidney cancer Neg Hx   . Bladder Cancer Neg Hx   . Colon cancer Neg Hx   . Colon polyps Neg Hx   . Rectal cancer Neg Hx    Social History   Tobacco Use  . Smoking status: Former Smoker    Packs/day: 0.25    Years: 61.00    Pack years: 15.25    Types: Cigarettes    Last attempt to quit: 03/25/2017    Years since quitting: 0.7  . Smokeless tobacco: Never Used  Substance Use Topics  . Alcohol use: No  . Drug use: No    Interim medical history since last visit reviewed. Allergies and medications reviewed  Review of Systems Per HPI unless specifically indicated above     Objective:    BP 108/62   Pulse 99   Temp 97.9 F (36.6 C) (Oral)   Resp 14   Ht 5\' 8"  (1.727 m)   Wt 151 lb 6.4 oz (68.7 kg)   SpO2 96%   BMI 23.02 kg/m   Wt Readings from Last 3 Encounters:  12/10/17 151 lb 6.4 oz (68.7 kg)  11/08/17 152 lb 4.8 oz (69.1 kg)  10/08/17 151 lb 1.6 oz (68.5 kg)    Physical Exam  Constitutional: She appears well-developed and well-nourished.    HENT:  Mouth/Throat: Mucous membranes are normal.  Eyes: EOM are normal. No scleral icterus.  Cardiovascular: Normal rate and regular rhythm.  Murmur heard.  Systolic murmur is present with a grade of 2/6. Pulmonary/Chest: Effort normal and breath sounds normal.  Skin: No bruising and no ecchymosis noted.  Psychiatric: She has a normal mood and affect. Her behavior is normal.    Results for orders placed or performed in visit on 12/10/17  POCT INR  Result Value Ref Range   INR 2.0 2.0 - 3.0      Assessment & Plan:   Problem List Items Addressed This Visit      Cardiovascular and Mediastinum   TIA (transient ischemic attack)   Relevant Medications   warfarin (COUMADIN) 2 MG tablet   Atrial fibrillation ([facility]) - Primary    Stable today, in NSR; continue anticoagulation; no chest pain, no bleeding on coumadin      Relevant Medications   warfarin (COUMADIN) 2 MG tablet   Other Relevant Orders   POCT INR (Completed)     Other   Anticoagulation goal of INR 2 to 3   Relevant Medications   warfarin (COUMADIN) 2 MG tablet   Other Relevant Orders   POCT INR (Completed)   Cardiac murmur    Reviewed echocardiogram          Follow up plan: Return in about 2 weeks (around 12/27/2017) for follow-up visit with Dr. Sanda Klein just for INR.  An after-visit summary was printed and given to the patient at [place].  Please see the patient instructions which may contain other information and recommendations beyond what is mentioned above in the assessment and plan.  Meds ordered this encounter  Medications  . warfarin (COUMADIN) 2 MG tablet    Sig: Take 3 mg on Sundays, and 2 mg daily all other days    Dispense:  32 tablet    Refill:  5    Orders Placed This Encounter  Procedures  . POCT INR

## 2017-12-10 NOTE — Patient Instructions (Addendum)
If you need something for aches or pains, try to use Tylenol (acetaminophen) instead of non-steroidals (which include Aleve, ibuprofen, Advil, Motrin, and naproxen); non-steroidals can cause long-term kidney damage  Take one pill of coumadin 6 days a week (Monday through Saturday) Take one and one-half pills of coumadin 1 day a week (Sunday) Return in 2-1/2 weeks

## 2017-12-10 NOTE — Assessment & Plan Note (Addendum)
Reviewed echocardiogram; plan to repeat in the fall

## 2017-12-20 DIAGNOSIS — N183 Chronic kidney disease, stage 3 (moderate): Secondary | ICD-10-CM | POA: Diagnosis not present

## 2017-12-20 DIAGNOSIS — N2581 Secondary hyperparathyroidism of renal origin: Secondary | ICD-10-CM | POA: Diagnosis not present

## 2017-12-24 ENCOUNTER — Encounter: Payer: Self-pay | Admitting: Family Medicine

## 2017-12-24 ENCOUNTER — Ambulatory Visit (INDEPENDENT_AMBULATORY_CARE_PROVIDER_SITE_OTHER): Payer: Medicare Other | Admitting: Family Medicine

## 2017-12-24 VITALS — BP 118/72 | HR 97 | Temp 98.0°F | Resp 14 | Ht 68.0 in | Wt 152.3 lb

## 2017-12-24 DIAGNOSIS — N183 Chronic kidney disease, stage 3 unspecified: Secondary | ICD-10-CM

## 2017-12-24 DIAGNOSIS — Z8781 Personal history of (healed) traumatic fracture: Secondary | ICD-10-CM

## 2017-12-24 DIAGNOSIS — Z7901 Long term (current) use of anticoagulants: Secondary | ICD-10-CM

## 2017-12-24 DIAGNOSIS — N2581 Secondary hyperparathyroidism of renal origin: Secondary | ICD-10-CM

## 2017-12-24 DIAGNOSIS — I480 Paroxysmal atrial fibrillation: Secondary | ICD-10-CM | POA: Diagnosis not present

## 2017-12-24 DIAGNOSIS — Z5181 Encounter for therapeutic drug level monitoring: Secondary | ICD-10-CM

## 2017-12-24 HISTORY — DX: Personal history of (healed) traumatic fracture: Z87.81

## 2017-12-24 LAB — POCT INR: INR: 2.4 (ref 2.0–3.0)

## 2017-12-24 NOTE — Patient Instructions (Signed)
What You Need to Know About Warfarin Warfarin is a blood thinner (anticoagulant). Anticoagulants help to prevent the formation of blood clots. They also help to stop the growth of blood clots. What are the side effects of warfarin? Too much warfarin can cause bleeding (hemorrhage) in any part of the body, such as:  Bleeding from the gums.  Unexplained bruises.  Bruises that get larger.  Blood in the urine.  Bloody or dark stools.  Bleeding in the brain (hemorrhagic stroke).  A nosebleed that is not easily stopped.  Coughing up blood.  Vomiting blood.  Warfarin use may also cause:  Skin rash or irritations  Nausea that does not go away.  Severe pain in the back or joints.  Painful toes that turn blue or purple (purple toe syndrome).  Painful ulcers that do not go away (skin necrosis).  What are the signs and symptoms of a blood clot? Too little warfarin can increase the risk of blood clots in your legs, lungs, or arms. Signs and symptoms of a DVT in your leg or arm may include:  Pain or swelling in your leg or arm.  Skin that is red or warm to the touch on your arm or leg.  Signs and symptoms of a pulmonary embolism may include:  Shortness of breath or difficulty breathing.  Chest pain.  Unexplained fever.  What are the signs and symptoms of a stroke? If you are taking too much or too little warfarin, you can have a stroke. Signs and symptoms of a stroke may include:  Weakness or numbness of your face, arm, or leg, especially on one side of your body.  Confusion or trouble thinking clearly.  Difficulty seeing with one or both eyes.  Difficulty walking or moving your arms or legs.  Dizziness.  Loss of balance or coordination.  Trouble speaking, trouble understanding speech, or both (aphasia).  Sudden, severe headache with no known cause.  Partial or total loss of consciousness.  What precautions do I need to take while using warfarin?   Take  warfarin exactly as told by your health care provider. Doing this helps you avoid bleeding or blood clots that could result in serious injury, pain, or disability.  Take your medicine at the same time every day. If you forget to take your dose of warfarin, take it as soon as you remember that day. If you do not remember on that day, do not take an extra dose the next day.  Contact your health care provider if you miss or take an extra dose. Do not change your dosage on your own to make up for missed or extra doses.  Wear or carry identification that says that you are taking warfarin.  Make sure that all health care providers, including your dentist, know you are taking warfarin.  If you need surgery, talk with your health care provider about whether you should stop taking warfarin before your surgery.  Avoid situations that cause bleeding. You may bleed more easily while taking warfarin. To limit bleeding, take the following actions: ? Use a softer toothbrush. ? Floss with waxed floss, not unwaxed floss. ? Shave with an electric razor, not with a blade. ? Limit your use of sharp objects. ? Avoid potentially harmful activities, such as contact sports. What do I need to know about warfarin and alcohol or drug use?  Avoid drinking alcohol, or limit alcohol intake to no more than 1 drink a day for nonpregnant women and 2 drinks a  day for men. One drink equals 12 oz of beer, 5 oz of wine, or 1 oz of hard liquor. ? If you change the amount of alcohol that you drink, tell your health care provider. Your warfarin dosage may need to be changed.  Avoid tobacco products, such as cigarettes, chewing tobacco, and e-cigarettes. If you need help quitting, ask your health care provider. ? If you change the amount of nicotine or tobacco that you use, tell your health care provider. Your warfarin dosage may need to be changed.  Avoid street drugs while taking warfarin. The effects of street drugs on  warfarin are not known. What do I need to know about warfarin and other medicines or supplements?  Many prescription and over-the-counter medicines can interfere with warfarin. Talk with your health care provider or your pharmacist before starting or stopping any new medicines. This includes over-the-counter vitamins, dietary supplements, herbal medicines, and pain medicines. Your warfarin dosage may need to be adjusted.  Some common over-the-counter medicines that may increase the risk of bleeding while taking warfarin include: ? Acetaminophen. ? Aspirin ? NSAIDs, such as ibuprofen or naproxen. ? Vitamin E. What do I need to know about warfarin and my diet?  It is important to maintain a normal, balanced diet while taking warfarin. Avoid major changes in your diet. If you are going to change your diet, talk with your health care provider before making changes.  Your health care provider may recommend that you work with a diet and nutrition specialist (dietitian).  Vitamin K decreases the effect of warfarin, and it is found in many foods. Eat a consistent amount of foods that contain vitamin K. For example, you may decide to eat 2 vitamin K-containing foods each day. Most foods that are high in vitamin K are green and leafy. Common foods that contain high amounts of vitamin K include:  Kale, raw or cooked.  Spinach, raw or cooked.  Collards, raw or cooked.  Swiss chard, raw or cooked.  Mustard greens, raw or cooked.  Turnip greens, raw or cooked.  Parsley, raw.  Broccoli, cooked.  Noodles, eggs, and spinach, enriched.  Brussels sprouts, raw or cooked.  Beet greens, raw or cooked.  Endive, raw.  Cabbage, cooked.  Asparagus, cooked.  Foods that contain moderate amounts of vitamin K include:  Broccoli, raw.  Cabbage, raw.  Bok choy, cooked.  Green leaf lettuce, raw  Prunes, stewed.  Angie Fava.  Kiwi.  Edamame, cooked.  Romaine lettuce,  raw.  Avocado.  Tuna, canned in oil.  Okra, cooked.  Black-eyed peas, cooked.  Green beans, cooked or raw.  Blueberries, raw.  Blackberries, raw.  Peas, cooked or raw.  Contact a health care provider if:  You miss a dose.  You take an extra dose.  You plan to have any kind of surgery or procedure.  You are unable to take your medicine due to nausea, vomiting, or diarrhea.  You have any major changes in your diet or you plan to make any major changes in your diet.  You start or stop any over-the-counter medicine, prescription medicine, or dietary supplement.  You become pregnant, plan to become pregnant, or think you may be pregnant.  You have menstrual periods that are heavier than usual.  You have unusual bruising. Get help right away if:  You develop symptoms of an allergic reaction, such as: ? Swelling of the lips, face, tongue, mouth, or throat. ? Rash. ? Itching. ? Itchy, red, swollen areas of skin (hives). ?  Trouble breathing. ? Chest tightness.  You have: ? Signs or symptoms of a stroke. ? Signs or symptoms of a blood clot. ? A fall or have an accident, especially if you hit your head. ? Blood in your urine. Your urine may look reddish, pinkish, or tea-colored. ? Blood in your stool. Your stool may be black or bright red. ? Bleeding that does not stop after applying pressure to the area for 30 minutes. ? Severe pain in your joints or back. ? Purple or blue toes. ? Skin ulcers that do not go away.  You vomit blood or cough up blood. The blood may be bright red, or it may look like coffee grounds. These symptoms may represent a serious problem that is an emergency. Do not wait to see if the symptoms will go away. Get medical help right away. Call your local emergency services (911 in the [place] Do not drive yourself to the hospital. Summary  Warfarin needs to be closely monitored with blood tests. It is very important to keep all lab visits and  follow-up visits with your health care provider.  Make sure that you know your target INR range and your warfarin dosage.  Wear or carry identification that says that you are taking warfarin.  Take warfarin at the same time every day. Call your health care provider if you miss a dose or if you take an extra dose. Do not change the dosage of warfarin on your own.  Know the signs and symptoms of blood clots, bleeding, and a stroke. Know when to get emergency medical help.  Tell all health care providers who care for you that you are taking warfarin.  Talk with your health care provider or your pharmacist before starting or stopping any new medicines.  Monitor how much vitamin K you eat every day. Try to eat the same amount every day. This information is not intended to replace advice given to you by your health care provider. Make sure you discuss any questions you have with your health care provider. Document Released: 05/22/2005 Document Revised: 02/01/2016 Document Reviewed: 08/18/2015 Elsevier Interactive Patient Education  2017 [employer].

## 2017-12-24 NOTE — Progress Notes (Signed)
BP 118/72   Pulse 97   Temp 98 F (36.7 C) (Oral)   Resp 14   Ht 5\' 8"  (1.727 m)   Wt 152 lb 4.8 oz (69.1 kg)   SpO2 96%   BMI 23.16 kg/m    Subjective:    Patient ID: Christine Burnett, female    DOB: 12/30/1940, 77 y.o.   MRN: [id number]  HPI: Christine Burnett is a 77 y.o. female  Chief Complaint  Patient presents with  . Anticoagulation    INR: 2.4 no abnormal brusing or bleeding.  Pt currently taking 2.5mg  on monday and 2mg  the rest of the week.    HPI Anticoagulation Patient is on warfarin 2.5 mg once a week and 2 mg daily every other day of the week. Patient is on anticoagulation for atrial fibrillation and has had a TIA in the past. Goal INR is 2-3. States has been eating kale some.   Lab Results  Component Value Date   INR 2.4 12/24/2017   INR 2.0 12/10/2017   INR 2.0 11/08/2017   Joint Pain Patient states after her hip fracture was put on Vitamin D after ORIF with ortho. She remembers to take it 1-4 times a week but notes everytime she take it she has joint pain in her knees, hips and elbows. States only has it the days she take the medicine and pain starts shortly after.  Depression screen [facility] 2/9 11/08/2017 09/27/2017 09/13/2017 08/09/2017 06/07/2017  Decreased Interest 0 0 0 0 0  Down, Depressed, Hopeless 0 0 0 0 0  PHQ - 2 Score 0 0 0 0 0  Altered sleeping 0 - - - -  Tired, decreased energy 0 - - - -  Change in appetite 0 - - - -  Feeling bad or failure about yourself  0 - - - -  Trouble concentrating 0 - - - -  Moving slowly or fidgety/restless 0 - - - -  Suicidal thoughts 0 - - - -  PHQ-9 Score 0 - - - -  Difficult doing work/chores Not difficult at all - - - -    Relevant past medical, surgical, family and social history reviewed Past Medical History:  Diagnosis Date  . A-fib ([place])   . Allergic rhinitis   . Anxiety   . Chronic bronchitis ([place])   . Chronic kidney disease, stage III (moderate) ([place]) 08/31/2017   Seeing nephrologist  . CKD  (chronic kidney disease), stage III ([place])   . Cornea scar   . Depression    unspecified  . Diverticulosis   . Hyperlipidemia   . 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 Hypertension   . Osteoporosis   . Risk for falls   . Situational disturbance   . TIA (transient ischemic attack)    Past Surgical History:  Procedure Laterality Date  . COLONOSCOPY  2016  . ESOPHAGUS SURGERY    . INTRAMEDULLARY (IM) NAIL INTERTROCHANTERIC Left 03/26/2017   Procedure: INTRAMEDULLARY (IM) NAIL INTERTROCHANTRIC;  Surgeon: Hessie Knows, MD;  Location: [facility] ORS;  Service: Orthopedics;  Laterality: Left;   Family History  Problem Relation Age of Onset  . Other Mother        [place] posioning  . Heart attack Father   . Prostate cancer Father   . Heart attack Brother   . Diabetes Brother   . Hypertension Daughter   . Kidney cancer Neg Hx   . Bladder Cancer Neg Hx   .  Colon cancer Neg Hx   . Colon polyps Neg Hx   . Rectal cancer Neg Hx    Social History   Tobacco Use  . Smoking status: Former Smoker    Packs/day: 0.25    Years: 61.00    Pack years: 15.25    Types: Cigarettes    Last attempt to quit: 03/25/2017    Years since quitting: 0.7  . Smokeless tobacco: Never Used  Substance Use Topics  . Alcohol use: No  . Drug use: No    Interim medical history since last visit reviewed. Allergies and medications reviewed  Review of Systems Per HPI unless specifically indicated above     Objective:    BP 118/72   Pulse 97   Temp 98 F (36.7 C) (Oral)   Resp 14   Ht 5\' 8"  (1.727 m)   Wt 152 lb 4.8 oz (69.1 kg)   SpO2 96%   BMI 23.16 kg/m   Wt Readings from Last 3 Encounters:  12/24/17 152 lb 4.8 oz (69.1 kg)  12/10/17 151 lb 6.4 oz (68.7 kg)  11/08/17 152 lb 4.8 oz (69.1 kg)    Physical Exam  Constitutional: She appears well-developed and well-nourished.  HENT:  Mouth/Throat: Mucous membranes are normal.  Eyes: EOM are normal. No  scleral icterus.  Cardiovascular: Normal rate and regular rhythm.  Pulmonary/Chest: Effort normal and breath sounds normal.  Skin: No bruising and no ecchymosis noted.  Psychiatric: She has a normal mood and affect. Her behavior is normal.    Results for orders placed or performed in visit on 12/24/17  POCT INR  Result Value Ref Range   INR 2.4 2.0 - 3.0      Assessment & Plan:   Problem List Items Addressed This Visit      Cardiovascular and Mediastinum   Atrial fibrillation ([place])     Endocrine   Hyperparathyroidism, secondary renal ([place])    Seeing Dr. Holley Raring; check vit D level, calcium, phos        Genitourinary   Chronic kidney disease, stage III (moderate) ([facility])    Avoiding NSAIDs; good water drinker        Other   Anticoagulation goal of INR 2 to 3 - Primary   Relevant Orders   POCT INR (Completed)   Hx of fracture of left hip    Still using cane; can walk without it at home; discussed fall risk          Follow up plan: Return in about 1 month (around 01/24/2018) for follow-up visit with Dr. Sanda Klein.  An after-visit summary was printed and given to the patient at [facility].  Please see the patient instructions which may contain other information and recommendations beyond what is mentioned above in the assessment and plan.  No orders of the defined types were placed in this encounter.   Orders Placed This Encounter  Procedures  . POCT INR

## 2017-12-24 NOTE — Assessment & Plan Note (Signed)
Still using cane; can walk without it at home; discussed fall risk

## 2017-12-24 NOTE — Assessment & Plan Note (Signed)
Seeing Dr. Holley Raring; check vit D level, calcium, phos

## 2017-12-24 NOTE — Assessment & Plan Note (Signed)
Avoiding NSAIDs; good water drinker

## 2018-01-24 ENCOUNTER — Ambulatory Visit (INDEPENDENT_AMBULATORY_CARE_PROVIDER_SITE_OTHER): Payer: Medicare Other | Admitting: Family Medicine

## 2018-01-24 ENCOUNTER — Ambulatory Visit (INDEPENDENT_AMBULATORY_CARE_PROVIDER_SITE_OTHER): Payer: Medicare Other

## 2018-01-24 VITALS — BP 122/72 | HR 80 | Temp 98.1°F | Resp 14 | Ht 68.0 in | Wt 145.0 lb

## 2018-01-24 DIAGNOSIS — J309 Allergic rhinitis, unspecified: Secondary | ICD-10-CM

## 2018-01-24 DIAGNOSIS — Z5181 Encounter for therapeutic drug level monitoring: Secondary | ICD-10-CM | POA: Diagnosis not present

## 2018-01-24 DIAGNOSIS — R739 Hyperglycemia, unspecified: Secondary | ICD-10-CM | POA: Diagnosis not present

## 2018-01-24 DIAGNOSIS — N183 Chronic kidney disease, stage 3 unspecified: Secondary | ICD-10-CM

## 2018-01-24 DIAGNOSIS — N2581 Secondary hyperparathyroidism of renal origin: Secondary | ICD-10-CM | POA: Diagnosis not present

## 2018-01-24 DIAGNOSIS — E782 Mixed hyperlipidemia: Secondary | ICD-10-CM

## 2018-01-24 DIAGNOSIS — Z0000 Encounter for general adult medical examination without abnormal findings: Secondary | ICD-10-CM | POA: Diagnosis not present

## 2018-01-24 DIAGNOSIS — I129 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 stage 4 chronic kidney disease, or unspecified chronic kidney disease: Secondary | ICD-10-CM

## 2018-01-24 DIAGNOSIS — I10 Essential (primary) hypertension: Secondary | ICD-10-CM | POA: Diagnosis not present

## 2018-01-24 DIAGNOSIS — I480 Paroxysmal atrial fibrillation: Secondary | ICD-10-CM

## 2018-01-24 DIAGNOSIS — Z7901 Long term (current) use of anticoagulants: Secondary | ICD-10-CM | POA: Diagnosis not present

## 2018-01-24 LAB — POCT INR: INR: 2.8 (ref 2.0–3.0)

## 2018-01-24 NOTE — Assessment & Plan Note (Signed)
On Coumadin, goal 2-3; continue same dose; return in one month

## 2018-01-24 NOTE — Assessment & Plan Note (Signed)
Check A1c. 

## 2018-01-24 NOTE — Progress Notes (Signed)
Subjective:   Christine Burnett is a 77 y.o. female who presents for Medicare Annual (Subsequent) preventive examination.  Review of Systems:  N/A Cardiac Risk Factors include: advanced age (>27men, >18 women);dyslipidemia;hypertension;sedentary lifestyle     Objective:     Vitals: BP 122/72 (BP Location: Left Arm, Patient Position: Sitting, Cuff Size: Normal)   Pulse 80   Temp 98.1 F (36.7 C) (Oral)   Resp 14   Ht 5\' 8"  (1.727 m)   Wt 145 lb (65.8 kg)   SpO2 95%   BMI 22.05 kg/m   Body mass index is 22.05 kg/m.  Advanced Directives 01/24/2018 05/29/2017 04/09/2017 03/25/2017 03/25/2017 01/16/2017 12/25/2016  Does Patient Have a Medical Advance Directive? Yes No No Yes Yes Yes Yes  Type of Paramedic of [place];Living will - - [place];Living will Healthcare Power of Attorney Living will Living will  Does patient want to make changes to medical advance directive? - - - No - Patient declined Yes (Inpatient - patient defers changing a medical advance directive at this time) - -  Copy of [place] in Chart? No - copy requested - - No - copy requested - - -  Would patient like information on creating a medical advance directive? - - - Yes (Inpatient - patient defers creating a medical advance directive at this time) Yes (Inpatient - patient defers creating a medical advance directive at this time) - -    Tobacco Social History   Tobacco Use  Smoking Status Former Smoker  . Packs/day: 0.25  . Years: 61.00  . Pack years: 15.25  . Types: Cigarettes  . Last attempt to quit: 03/25/2017  . Years since quitting: 0.8  Smokeless Tobacco Never Used  Tobacco Comment   smoking cessation materials not required     Counseling given: No Comment: smoking cessation materials not required  Clinical Intake:  Pre-visit preparation completed: Yes  Pain : No/denies pain   BMI - recorded: 22.05 Nutritional Status: BMI of  19-24  Normal Nutritional Risks: None Diabetes: No  How often do you need to have someone help you when you read instructions, pamphlets, or other written materials from your doctor or pharmacy?: 1 - Never  Interpreter Needed?: No  Information entered by :: AEversole, LPN  Past Medical History:  Diagnosis Date  . A-fib ([place])   . Allergic rhinitis   . Anxiety   . Chronic bronchitis ([place])   . Chronic kidney disease, stage III (moderate) ([place]) 08/31/2017   Seeing nephrologist  . CKD (chronic kidney disease), stage III ([place])   . Cornea scar   . Depression    unspecified  . Diverticulosis   . Hyperlipidemia   . 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 Hypertension   . Osteoporosis   . Risk for falls   . Situational disturbance   . TIA (transient ischemic attack)    Past Surgical History:  Procedure Laterality Date  . COLONOSCOPY  2016  . ESOPHAGUS SURGERY    . INTRAMEDULLARY (IM) NAIL INTERTROCHANTERIC Left 03/26/2017   Procedure: INTRAMEDULLARY (IM) NAIL INTERTROCHANTRIC;  Surgeon: Hessie Knows, MD;  Location: [facility] ORS;  Service: Orthopedics;  Laterality: Left;   Family History  Problem Relation Age of Onset  . Other Mother        [place] posioning  . Heart attack Father   . Prostate cancer Father   . Heart attack Brother   . Diabetes Brother   .  Hypertension Daughter   . Kidney cancer Neg Hx   . Bladder Cancer Neg Hx   . Colon cancer Neg Hx   . Colon polyps Neg Hx   . Rectal cancer Neg Hx    Social History   Socioeconomic History  . Marital status: Divorced    Spouse name: Not on file  . Number of children: 4  . Years of education: Not on file  . Highest education level: 12th grade  Occupational History  . Occupation: Retired  Scientific laboratory technician  . Financial resource strain: Not hard at all  . Food insecurity:    Worry: Never true    Inability: Never true  . Transportation needs:    Medical: No    Non-medical: No    Tobacco Use  . Smoking status: Former Smoker    Packs/day: 0.25    Years: 61.00    Pack years: 15.25    Types: Cigarettes    Last attempt to quit: 03/25/2017    Years since quitting: 0.8  . Smokeless tobacco: Never Used  . Tobacco comment: smoking cessation materials not required  Substance and Sexual Activity  . Alcohol use: No  . Drug use: No  . Sexual activity: Never  Lifestyle  . Physical activity:    Days per week: 0 days    Minutes per session: 0 min  . Stress: Very much  Relationships  . Social connections:    Talks on phone: Patient refused    Gets together: Patient refused    Attends religious service: Patient refused    Active member of club or organization: Patient refused    Attends meetings of clubs or organizations: Patient refused    Relationship status: Married  Other Topics Concern  . Not on file  Social History Narrative   Full Code   Current smoker   4 children   Denies alcohol use    Outpatient Encounter Medications as of 01/24/2018  Medication Sig  . acetaminophen (TYLENOL) 325 MG tablet Take 1 tablet (325 mg total) by mouth at bedtime as needed.  Marland Kitchen albuterol (PROVENTIL HFA;VENTOLIN HFA) 108 (90 Base) MCG/ACT inhaler Inhale 2 puffs into the lungs every 6 (six) hours as needed for wheezing or shortness of breath.  . Cholecalciferol (VITAMIN D-3) 1000 units CAPS Take 1 capsule (1,000 Units total) by mouth daily.  Marland Kitchen PARoxetine (PAXIL) 30 MG tablet Take 1 tablet (30 mg total) by mouth daily.  . pravastatin (PRAVACHOL) 40 MG tablet Take 1 tablet (40 mg total) by mouth at bedtime.  Marland Kitchen warfarin (COUMADIN) 2 MG tablet Take 3 mg on Sundays, and 2 mg daily all other days  . Ascorbic Acid (VITAMIN C PO) Take 1 tablet by mouth daily.  . Homeopathic Products (ZINC COLD THERAPY PO) Take 1 tablet by mouth daily as needed.   No facility-administered encounter medications on file as of 01/24/2018.     Activities of Daily Living In your present state of health, do  you have any difficulty performing the following activities: 01/24/2018 09/13/2017  Hearing? N N  Comment denies hearing aids -  Vision? N N  Comment wears eyeglasses -  Difficulty concentrating or making decisions? N Y  Walking or climbing stairs? N Y  Dressing or bathing? N N  Doing errands, shopping? N N  Preparing Food and eating ? N -  Comment lower dentures -  Using the Toilet? N -  In the past six months, have you accidently leaked urine? Y -  Comment  stress incontinence -  Do you have problems with loss of bowel control? N -  Managing your Medications? N -  Managing your Finances? N -  Housekeeping or managing your Housekeeping? N -  Some recent data might be hidden    Patient Care Team: Lada, Satira Anis, MD as PCP - General (Family Medicine) Dagoberto Ligas, MD as Consulting Physician (Internal Medicine)    Assessment:   This is a routine wellness examination for [place].  Exercise Activities and Dietary recommendations Current Exercise Habits: The patient does not participate in regular exercise at present, Exercise limited by: None identified  Goals    . DIET - INCREASE WATER INTAKE     Recommend to drink at least 6-8 8oz glasses of water per day.    . Quit smoking / using tobacco     Recommend to quit smoking. Pt to taper down 1 cigarette a day to 0 cigarettes.        Fall Risk Fall Risk  01/24/2018 11/08/2017 09/27/2017 09/13/2017 08/09/2017  Falls in the past year? Yes No No No Yes  Number falls in past yr: 1 - - - 1  Injury with Fall? No - - - Yes  Comment - - - - broke left hip  Risk Factor Category  - - - - -  Risk for fall due to : Impaired vision;Impaired balance/gait;Medication side effect - - - -  Risk for fall due to: Comment wears eyeglasses; ambulates with cane - - - -  Follow up Falls evaluation completed;Education provided;Falls prevention discussed - - - -   FALL RISK PREVENTION PERTAINING TO HOME: Is your home free of loose throw rugs in  walkways, pet beds, electrical cords, etc? Yes Is there adequate lighting in your home to reduce risk of falls?  Yes Are there stairs in or around your home WITH handrails? Yes  ASSISTIVE DEVICES UTILIZED TO PREVENT FALLS: Use of a cane, walker or w/c? Yes, cane Grab bars in the bathroom? Yes  Shower chair or a place to sit while bathing? No An elevated toilet seat or a handicapped toilet? No  Timed Get Up and Go Performed: Yes. Pt ambulated 10 feet within 12 sec. Gait slow, steady and with the use of an assistive device. No intervention required at this time. Fall risk prevention has been discussed.  Community Resource Referral:  Pt declined my offer to send [employer] Referral to Care Guide for a shower chair or an elevated toilet seat.  Depression Screen PHQ 2/9 Scores 01/24/2018 11/08/2017 09/27/2017 09/13/2017  PHQ - 2 Score 1 0 0 0  PHQ- 9 Score 1 0 - -     Cognitive Function     6CIT Screen 01/24/2018 12/25/2016  What Year? 0 points 0 points  What month? 0 points 0 points  What time? 0 points 0 points  Count back from 20 0 points 0 points  Months in reverse 0 points 0 points  Repeat phrase 0 points 6 points  Total Score 0 6    Immunization History  Administered Date(s) Administered  . Influenza Split 03/15/2010, 03/21/2011  . Influenza, High Dose Seasonal PF 04/07/2015, 03/22/2016, 03/13/2017  . Influenza,inj,Quad PF,6+ Mos 03/19/2013, 03/10/2014  . Pneumococcal Conjugate-13 11/08/2017  . Tdap 05/29/2017    Qualifies for Shingles Vaccine?Yes. Due for Shingrix. Education has been provided regarding the importance of this vaccine. Pt has been advised to call insurance company to determine out of pocket expense. Advised may also receive vaccine at local  pharmacy or Health Dept. Verbalized acceptance and understanding.  Screening Tests Health Maintenance  Topic Date Due  . INFLUENZA VACCINE  01/03/2018  . PNA vac Low Risk Adult (2 of 2 - PPSV23) 11/09/2018  .  TETANUS/TDAP  05/30/2027  . DEXA SCAN  Completed    Cancer Screenings: Lung: Low Dose CT Chest recommended if Age 42-80 years, 30 pack-year currently smoking OR have quit w/in 15years. Patient does not qualify. Breast Screening: No longer required Up to date of Bone Density/Dexa? No. Completed 11/01/11. Results reflect osteopenia. Repeat every 2 years. Declined my offer to order this screening. Education provided re: the importance of this screening but still declined. Colorectal: No longer required  Additional Screenings: Hepatitis C Screening: Does not qualify    Plan:  I have personally reviewed and addressed the Medicare Annual Wellness questionnaire and have noted the following in the patient's chart:  A. Medical and social history B. Use of alcohol, tobacco or illicit drugs  C. Current medications and supplements D. Functional ability and status E.  Nutritional status F.  Physical activity G. Advance directives H. List of other physicians I.  Hospitalizations, surgeries, and ER visits in previous 12 months J.  [place] such as hearing and vision if needed, cognitive and depression L. Referrals and appointments  In addition, I have reviewed and discussed with patient certain preventive protocols, quality metrics, and best practice recommendations. A written personalized care plan for preventive services as well as general preventive health recommendations were provided to patient.  See attached scanned questionnaire for additional information.   Signed,  Aleatha Borer, LPN Nurse Health Advisor

## 2018-01-24 NOTE — Patient Instructions (Addendum)
Do please start a little over-the-counter vitamin B12 Try to use PLAIN allergy medicine without the decongestant Avoid: phenylephrine, phenylpropanolamine, and pseudoephredine Limit egg yolks to no more than 3 per week Return in one month

## 2018-01-24 NOTE — Assessment & Plan Note (Signed)
Managed by nephrologist

## 2018-01-24 NOTE — Assessment & Plan Note (Signed)
Managed by kidney doctor; avoiding NSAIDs; continue to hydrate

## 2018-01-24 NOTE — Assessment & Plan Note (Signed)
At goal today; return in one month next INR

## 2018-01-24 NOTE — Assessment & Plan Note (Signed)
Avoid decongestants

## 2018-01-24 NOTE — Assessment & Plan Note (Signed)
Excellent control.   

## 2018-01-24 NOTE — Assessment & Plan Note (Signed)
Controlled today 

## 2018-01-24 NOTE — Patient Instructions (Signed)
Christine Burnett , Thank you for taking time to come for your Medicare Wellness Visit. I appreciate your ongoing commitment to your health goals. Please review the following plan we discussed and let me know if I can assist you in the future.   Screening recommendations/referrals: Colorectal Screening: No longer required Mammogram: Declined Bone Density: Declined  Vision and Dental Exams: Recommended annual ophthalmology exams for early detection of glaucoma and other disorders of the eye Recommended annual dental exams for proper oral hygiene  Vaccinations: Influenza vaccine: Up to date Pneumococcal vaccine: Up to date Tdap vaccine: Up to date Shingles vaccine: Please call your insurance company to determine your out of pocket expense for the Shingrix vaccine. You may also receive this vaccine at your local pharmacy or Health Dept.    Advanced directives: Please bring a copy of your POA (Power of Attorney) and/or Living Will to your next appointment.  Goals: Recommend to drink at least 6-8 8oz glasses of water per day.  Next appointment: Please schedule your Annual Wellness Visit with your Nurse Health Advisor in one year.  Preventive Care 8 Years and Older, Female Preventive care refers to lifestyle choices and visits with your health care provider that can promote health and wellness. What does preventive care include?  A yearly physical exam. This is also called an annual well check.  Dental exams once or twice a year.  Routine eye exams. Ask your health care provider how often you should have your eyes checked.  Personal lifestyle choices, including:  Daily care of your teeth and gums.  Regular physical activity.  Eating a healthy diet.  Avoiding tobacco and drug use.  Limiting alcohol use.  Practicing safe sex.  Taking low-dose aspirin every day.  Taking vitamin and mineral supplements as recommended by your health care provider. What happens during an annual well  check? The services and screenings done by your health care provider during your annual well check will depend on your age, overall health, lifestyle risk factors, and family history of disease. Counseling  Your health care provider may ask you questions about your:  Alcohol use.  Tobacco use.  Drug use.  Emotional well-being.  Home and relationship well-being.  Sexual activity.  Eating habits.  History of falls.  Memory and ability to understand (cognition).  Work and work Statistician.  Reproductive health. Screening  You may have the following tests or measurements:  Height, weight, and BMI.  Blood pressure.  Lipid and cholesterol levels. These may be checked every 5 years, or more frequently if you are over 1 years old.  Skin check.  Lung cancer screening. You may have this screening every year starting at age 39 if you have a 30-pack-year history of smoking and currently smoke or have quit within the past 15 years.  Fecal occult blood test (FOBT) of the stool. You may have this test every year starting at age 50.  Flexible sigmoidoscopy or colonoscopy. You may have a sigmoidoscopy every 5 years or a colonoscopy every 10 years starting at age 67.  Hepatitis C blood test.  Hepatitis B blood test.  Sexually transmitted disease (STD) testing.  Diabetes screening. This is done by checking your blood sugar (glucose) after you have not eaten for a while (fasting). You may have this done every 1-3 years.  Bone density scan. This is done to screen for osteoporosis. You may have this done starting at age 3.  Mammogram. This may be done every 1-2 years. Talk to your  health care provider about how often you should have regular mammograms. Talk with your health care provider about your test results, treatment options, and if necessary, the need for more tests. Vaccines  Your health care provider may recommend certain vaccines, such as:  Influenza vaccine. This is  recommended every year.  Tetanus, diphtheria, and acellular pertussis (Tdap, Td) vaccine. You may need a Td booster every 10 years.  Zoster vaccine. You may need this after age 44.  Pneumococcal 13-valent conjugate (PCV13) vaccine. One dose is recommended after age 53.  Pneumococcal polysaccharide (PPSV23) vaccine. One dose is recommended after age 26. Talk to your health care provider about which screenings and vaccines you need and how often you need them. This information is not intended to replace advice given to you by your health care provider. Make sure you discuss any questions you have with your health care provider. Document Released: 06/18/2015 Document Revised: 02/09/2016 Document Reviewed: 03/23/2015 Elsevier Interactive Patient Education  2017 [place] Prevention in the Home Falls can cause injuries. They can happen to people of all ages. There are many things you can do to make your home safe and to help prevent falls. What can I do on the outside of my home?  Regularly fix the edges of walkways and driveways and fix any cracks.  Remove anything that might make you trip as you walk through a door, such as a raised step or threshold.  Trim any bushes or trees on the path to your home.  Use bright outdoor lighting.  Clear any walking paths of anything that might make someone trip, such as rocks or tools.  Regularly check to see if handrails are loose or broken. Make sure that both sides of any steps have handrails.  Any raised decks and porches should have guardrails on the edges.  Have any leaves, snow, or ice cleared regularly.  Use sand or salt on walking paths during winter.  Clean up any spills in your garage right away. This includes oil or grease spills. What can I do in the bathroom?  Use night lights.  Install grab bars by the toilet and in the tub and shower. Do not use towel bars as grab bars.  Use non-skid mats or decals in the tub or  shower.  If you need to sit down in the shower, use a plastic, non-slip stool.  Keep the floor dry. Clean up any water that spills on the floor as soon as it happens.  Remove soap buildup in the tub or shower regularly.  Attach bath mats securely with double-sided non-slip rug tape.  Do not have throw rugs and other things on the floor that can make you trip. What can I do in the bedroom?  Use night lights.  Make sure that you have a light by your bed that is easy to reach.  Do not use any sheets or blankets that are too big for your bed. They should not hang down onto the floor.  Have a firm chair that has side arms. You can use this for support while you get dressed.  Do not have throw rugs and other things on the floor that can make you trip. What can I do in the kitchen?  Clean up any spills right away.  Avoid walking on wet floors.  Keep items that you use a lot in easy-to-reach places.  If you need to reach something above you, use a strong step stool that has a  grab bar.  Keep electrical cords out of the way.  Do not use floor polish or wax that makes floors slippery. If you must use wax, use non-skid floor wax.  Do not have throw rugs and other things on the floor that can make you trip. What can I do with my stairs?  Do not leave any items on the stairs.  Make sure that there are handrails on both sides of the stairs and use them. Fix handrails that are broken or loose. Make sure that handrails are as long as the stairways.  Check any carpeting to make sure that it is firmly attached to the stairs. Fix any carpet that is loose or worn.  Avoid having throw rugs at the top or bottom of the stairs. If you do have throw rugs, attach them to the floor with carpet tape.  Make sure that you have a light switch at the top of the stairs and the bottom of the stairs. If you do not have them, ask someone to add them for you. What else can I do to help prevent  falls?  Wear shoes that:  Do not have high heels.  Have rubber bottoms.  Are comfortable and fit you well.  Are closed at the toe. Do not wear sandals.  If you use a stepladder:  Make sure that it is fully opened. Do not climb a closed stepladder.  Make sure that both sides of the stepladder are locked into place.  Ask someone to hold it for you, if possible.  Clearly mark and make sure that you can see:  Any grab bars or handrails.  First and last steps.  Where the edge of each step is.  Use tools that help you move around (mobility aids) if they are needed. These include:  Canes.  Walkers.  Scooters.  Crutches.  Turn on the lights when you go into a dark area. Replace any light bulbs as soon as they burn out.  Set up your furniture so you have a clear path. Avoid moving your furniture around.  If any of your floors are uneven, fix them.  If there are any pets around you, be aware of where they are.  Review your medicines with your doctor. Some medicines can make you feel dizzy. This can increase your chance of falling. Ask your doctor what other things that you can do to help prevent falls. This information is not intended to replace advice given to you by your health care provider. Make sure you discuss any questions you have with your health care provider. Document Released: 03/18/2009 Document Revised: 10/28/2015 Document Reviewed: 06/26/2014 Elsevier Interactive Patient Education  2017 [employer].

## 2018-01-24 NOTE — Progress Notes (Signed)
BP 122/72   Pulse 80   Temp 98.1 F (36.7 C) (Oral)   Resp 14   Ht 5\' 8"  (1.727 m)   Wt 145 lb (65.8 kg)   SpO2 95%   BMI 22.05 kg/m    Subjective:    Patient ID: Christine Burnett, female    DOB: November 07, 1940, 77 y.o.   MRN: [id number]  HPI: Christine Burnett is a 77 y.o. female  Chief Complaint  Patient presents with  . Follow-up  . Anticoagulation    PT:2.8    HPI Patient is here for f/u She just had her Medicare wellness visit earlier today with Aleatha Borer, RN She is on Coumadin for atrial fibrillation; goal INR 2-3 Last INR was July 22nd, and it was 2.4 No bleeding; no blood from nose, gums, urine, or rectum No missed doses of Coumadin  Today: PT 33.3 INR 2.8  Taking tylenol at times, but gets red rash the morning after; she will try brand name, no itching or pain; aware that interfers with Coumadin CKD stage 3, managed by kidney doctor; just checked, GFR was 35 or 33 she thinks GFR was as low as 29 last fall; she has secondary hyperparathyroidism, also managed by kidney doctor We are unable to see those labs, but she says they were just done recently Last creatinine was 1.4, down from 1.63 in October 2018  Last CBC in April 2019 was normal Vitamin B12 336 in April Last three A1c readings: 5.6, 5.7, 5.8 (going back in time)  LDL was 91; she had stopped smoking for almost a year; not much chocolate, trying to lose her "gut" Cholesterol reviewed; fried chicken, some fish; no much bacon or other fatty meats; likes cheese; likes eggs  Depression screen [facility] 2/9 01/24/2018 11/08/2017 09/27/2017 09/13/2017 08/09/2017  Decreased Interest 0 0 0 0 0  Down, Depressed, Hopeless 1 0 0 0 0  PHQ - 2 Score 1 0 0 0 0  Altered sleeping 0 0 - - -  Tired, decreased energy 0 0 - - -  Change in appetite 0 0 - - -  Feeling bad or failure about yourself  0 0 - - -  Trouble concentrating 0 0 - - -  Moving slowly or fidgety/restless 0 0 - - -  Suicidal thoughts 0 0 - - -  PHQ-9  Score 1 0 - - -  Difficult doing work/chores Not difficult at all Not difficult at all - - -    Relevant past medical, surgical, family and social history reviewed Past Medical History:  Diagnosis Date  . A-fib ([place])   . Allergic rhinitis   . Anxiety   . Chronic bronchitis ([place])   . Chronic kidney disease, stage III (moderate) ([place]) 08/31/2017   Seeing nephrologist  . CKD (chronic kidney disease), stage III ([place])   . Cornea scar   . Depression    unspecified  . Diverticulosis   . Hyperlipidemia   . Hyperparathyroidism, secondary renal ([place]) 08/31/2017   Managed by nephrologist  . Hypertension   . Osteoporosis   . Risk for falls   . Situational disturbance   . TIA (transient ischemic attack)    Past Surgical History:  Procedure Laterality Date  . COLONOSCOPY  2016  . ESOPHAGUS SURGERY    . INTRAMEDULLARY (IM) NAIL INTERTROCHANTERIC Left 03/26/2017   Procedure: INTRAMEDULLARY (IM) NAIL INTERTROCHANTRIC;  Surgeon: Hessie Knows, MD;  Location: [facility] ORS;  Service: Orthopedics;  Laterality: Left;   Family History  Problem Relation Age of Onset  . Other Mother        [place] posioning  . Heart attack Father   . Prostate cancer Father   . Heart attack Brother   . Diabetes Brother   . Hypertension Daughter   . Kidney cancer Neg Hx   . Bladder Cancer Neg Hx   . Colon cancer Neg Hx   . Colon polyps Neg Hx   . Rectal cancer Neg Hx    Social History   Tobacco Use  . Smoking status: Former Smoker    Packs/day: 0.25    Years: 61.00    Pack years: 15.25    Types: Cigarettes    Last attempt to quit: 03/25/2017    Years since quitting: 0.8  . Smokeless tobacco: Never Used  . Tobacco comment: smoking cessation materials not required  Substance Use Topics  . Alcohol use: No  . Drug use: No    Interim medical history since last visit reviewed. Allergies and medications reviewed  Review of Systems Per HPI unless specifically indicated above     Objective:    BP  122/72   Pulse 80   Temp 98.1 F (36.7 C) (Oral)   Resp 14   Ht 5\' 8"  (1.727 m)   Wt 145 lb (65.8 kg)   SpO2 95%   BMI 22.05 kg/m   Wt Readings from Last 3 Encounters:  01/24/18 145 lb (65.8 kg)  01/24/18 145 lb (65.8 kg)  12/24/17 152 lb 4.8 oz (69.1 kg)    Physical Exam  Constitutional: She appears well-developed and well-nourished.  HENT:  Mouth/Throat: Mucous membranes are normal.  Eyes: EOM are normal. No scleral icterus.  Cardiovascular: Normal rate and regular rhythm.  Pulmonary/Chest: Effort normal and breath sounds normal.  Neurological:  Ambulatory with cane  Skin: No bruising and no ecchymosis noted.  Psychiatric: She has a normal mood and affect. Her behavior is normal. Her mood appears not anxious. She does not exhibit a depressed mood.    Results for orders placed or performed in visit on 01/24/18  POCT INR  Result Value Ref Range   INR 2.8 2.0 - 3.0      Assessment & Plan:   Problem List Items Addressed This Visit      Cardiovascular and Mediastinum   HTN (hypertension)    Excellent control      Atrial fibrillation ([place]) - Primary    On Coumadin, goal 2-3; continue same dose; return in one month      Relevant Orders   POCT INR (Completed)     Respiratory   Allergic rhinitis    Avoid decongestants        Endocrine   Hyperparathyroidism, secondary renal ([place])    Managed by nephrologist        Genitourinary   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 stage 4 chronic kidney disease, or unspecified chronic kidney disease    Controlled today      Chronic kidney disease, stage III (moderate) ([place])    Managed by kidney doctor; avoiding NSAIDs; continue to hydrate        Other   Hyperlipidemia    Check lipids; avoid foods from cows and pigs      Relevant Orders   Lipid panel   Hyperglycemia    Check A1c      Relevant Orders   Hemoglobin A1c   Anticoagulation goal of INR 2 to 3    At goal today; return  in one  month next INR       Other Visit Diagnoses    Medication monitoring encounter       Relevant Orders   Hepatic function panel       Follow up plan: Return in about 1 month (around 02/24/2018) for follow-up visit with Dr. Sanda Klein (or just before).  An after-visit summary was printed and given to the patient at [place].  Please see the patient instructions which may contain other information and recommendations beyond what is mentioned above in the assessment and plan.  No orders of the defined types were placed in this encounter.   Orders Placed This Encounter  Procedures  . Lipid panel  . Hemoglobin A1c  . Hepatic function panel  . POCT INR

## 2018-01-24 NOTE — Assessment & Plan Note (Signed)
Check lipids; avoid foods from cows and pigs

## 2018-01-25 ENCOUNTER — Other Ambulatory Visit: Payer: Self-pay | Admitting: Family Medicine

## 2018-01-25 LAB — HEPATIC FUNCTION PANEL
AG Ratio: 1.6 (calc) (ref 1.0–2.5)
ALBUMIN MSPROF: 4.4 g/dL (ref 3.6–5.1)
ALKALINE PHOSPHATASE (APISO): 85 U/L (ref 33–130)
ALT: 9 U/L (ref 6–29)
AST: 16 U/L (ref 10–35)
BILIRUBIN DIRECT: 0.1 mg/dL (ref 0.0–0.2)
Globulin: 2.8 g/dL (calc) (ref 1.9–3.7)
Indirect Bilirubin: 0.4 mg/dL (calc) (ref 0.2–1.2)
Total Bilirubin: 0.5 mg/dL (ref 0.2–1.2)
Total Protein: 7.2 g/dL (ref 6.1–8.1)

## 2018-01-25 LAB — LIPID PANEL
CHOLESTEROL: 155 mg/dL (ref <200)
HDL: 49 mg/dL — ABNORMAL LOW (ref >50)
LDL Cholesterol (Calc): 82 mg/dL (calc)
Non-HDL Cholesterol (Calc): 106 mg/dL (calc) (ref <130)
Total CHOL/HDL Ratio: 3.2 (calc) (ref <5.0)
Triglycerides: 139 mg/dL (ref <150)

## 2018-01-25 LAB — HEMOGLOBIN A1C
EAG (MMOL/L): 6.5 (calc)
HEMOGLOBIN A1C: 5.7 %{Hb} — AB (ref <5.7)
MEAN PLASMA GLUCOSE: 117 (calc)

## 2018-01-25 MED ORDER — ATORVASTATIN CALCIUM 40 MG PO TABS: 40.0000 mg | ORAL_TABLET | Freq: Every day | ORAL | 0 refills | Status: DC

## 2018-01-25 NOTE — Progress Notes (Signed)
Switch statin; recheck lipids in 2 months with an INR

## 2018-02-25 ENCOUNTER — Ambulatory Visit (INDEPENDENT_AMBULATORY_CARE_PROVIDER_SITE_OTHER): Payer: Medicare Other | Admitting: Family Medicine

## 2018-02-25 ENCOUNTER — Encounter: Payer: Self-pay | Admitting: Family Medicine

## 2018-02-25 VITALS — BP 120/68 | HR 85 | Temp 97.5°F | Ht 68.0 in | Wt 151.7 lb

## 2018-02-25 DIAGNOSIS — E782 Mixed hyperlipidemia: Secondary | ICD-10-CM | POA: Diagnosis not present

## 2018-02-25 DIAGNOSIS — Z23 Encounter for immunization: Secondary | ICD-10-CM

## 2018-02-25 DIAGNOSIS — I480 Paroxysmal atrial fibrillation: Secondary | ICD-10-CM | POA: Diagnosis not present

## 2018-02-25 LAB — POCT INR: INR: 1.7 — AB (ref 2.0–3.0)

## 2018-02-25 MED ORDER — ATORVASTATIN CALCIUM 40 MG PO TABS: 40.0000 mg | ORAL_TABLET | Freq: Every day | ORAL | 0 refills | Status: DC

## 2018-02-25 NOTE — Patient Instructions (Addendum)
Take 4 mg warfarin today only and then get back on track Return in 2 weeks for next INR check Stop pravastatin  Start atorvastatin Call with any problems

## 2018-02-25 NOTE — Progress Notes (Signed)
BP 120/68   Pulse 85   Temp (!) 97.5 F (36.4 C)   Ht 5\' 8"  (1.727 m)   Wt 151 lb 11.2 oz (68.8 kg)   SpO2 95%   BMI 23.07 kg/m    Subjective:    Patient ID: Christine Burnett, female    DOB: 28-Oct-1940, 77 y.o.   MRN: [id number]  HPI: Christine Burnett is a 77 y.o. female  Chief Complaint  Patient presents with  . Follow-up    INR/PT    HPI Here for INR check INR is 1.7 She knows what happens She ran out of her warfarin, and she told her pharmacy and she was out for two days She has been out for two days and started back Takes it in the evening around 9:30 pm No SHOB or swelling in the legs; no bleeding from nose, gums, urine, or rectum Was eating a lot of salads lately (increased vitamin K intake) She does not have valvular disease  She had her cholesterol done; never started the atorvastatin; still on pravastatin; just never got the message; did not realize the change recommended; not much red meat; lots of chicken and fish; likes cheese  She says she is not prediabetic; no dry mouth, no increased thirst; brother had diabetes  Depression screen [facility] 2/9 02/25/2018 01/24/2018 11/08/2017 09/27/2017 09/13/2017  Decreased Interest 0 0 0 0 0  Down, Depressed, Hopeless 0 1 0 0 0  PHQ - 2 Score 0 1 0 0 0  Altered sleeping 1 0 0 - -  Tired, decreased energy 0 0 0 - -  Change in appetite 0 0 0 - -  Feeling bad or failure about yourself  0 0 0 - -  Trouble concentrating 0 0 0 - -  Moving slowly or fidgety/restless 0 0 0 - -  Suicidal thoughts 0 0 0 - -  PHQ-9 Score 1 1 0 - -  Difficult doing work/chores Not difficult at all Not difficult at all Not difficult at all - -    Relevant past medical, surgical, family and social history reviewed Past Medical History:  Diagnosis Date  . A-fib ([place])   . Allergic rhinitis   . Anxiety   . Chronic bronchitis ([place])   . Chronic kidney disease, stage III (moderate) ([place]) 08/31/2017   Seeing nephrologist  . CKD (chronic kidney  disease), stage III ([place])   . Cornea scar   . Depression    unspecified  . Diverticulosis   . Hyperlipidemia   . Hyperparathyroidism, secondary renal ([place]) 08/31/2017   Managed by nephrologist  . Hypertension   . Osteoporosis   . Risk for falls   . Situational disturbance   . TIA (transient ischemic attack)    Past Surgical History:  Procedure Laterality Date  . COLONOSCOPY  2016  . ESOPHAGUS SURGERY    . INTRAMEDULLARY (IM) NAIL INTERTROCHANTERIC Left 03/26/2017   Procedure: INTRAMEDULLARY (IM) NAIL INTERTROCHANTRIC;  Surgeon: Hessie Knows, MD;  Location: [facility] ORS;  Service: Orthopedics;  Laterality: Left;   Family History  Problem Relation Age of Onset  . Other Mother        Uremeic posioning  . Heart attack Father   . Prostate cancer Father   . Heart attack Brother   . Diabetes Brother   . Hypertension Daughter   . Kidney cancer Neg Hx   . Bladder Cancer Neg Hx   . Colon cancer Neg Hx   . Colon polyps Neg Hx   .  Rectal cancer Neg Hx    Social History   Tobacco Use  . Smoking status: Former Smoker    Packs/day: 0.25    Years: 61.00    Pack years: 15.25    Types: Cigarettes    Last attempt to quit: 03/25/2017    Years since quitting: 0.9  . Smokeless tobacco: Never Used  . Tobacco comment: smoking cessation materials not required  Substance Use Topics  . Alcohol use: No  . Drug use: No    Interim medical history since last visit reviewed. Allergies and medications reviewed  Review of Systems Per HPI unless specifically indicated above     Objective:    BP 120/68   Pulse 85   Temp (!) 97.5 F (36.4 C)   Ht 5\' 8"  (1.727 m)   Wt 151 lb 11.2 oz (68.8 kg)   SpO2 95%   BMI 23.07 kg/m   Wt Readings from Last 3 Encounters:  02/25/18 151 lb 11.2 oz (68.8 kg)  01/24/18 145 lb (65.8 kg)  01/24/18 145 lb (65.8 kg)    Physical Exam  Constitutional: She appears well-developed and well-nourished.  HENT:  Mouth/Throat: Mucous membranes are normal.    Eyes: EOM are normal. No scleral icterus.  Cardiovascular: Normal rate and regular rhythm.  Pulmonary/Chest: Effort normal and breath sounds normal.  Psychiatric: She has a normal mood and affect. Her behavior is normal.       Assessment & Plan:   Problem List Items Addressed This Visit      Cardiovascular and Mediastinum   Atrial fibrillation ([place]) - Primary    On chronic anticoagulation; goal INR 2-3; discussed newer Factor Xa inhibitors, and will start that (transition) in about 2 months when he is running out of warfarin; discussed risks of strokes and clots, etc.      Relevant Medications   atorvastatin (LIPITOR) 40 MG tablet   Other Relevant Orders   POCT INR (Completed)     Other   Hyperlipidemia    Discussed previous note about switching statins      Relevant Medications   atorvastatin (LIPITOR) 40 MG tablet    Other Visit Diagnoses    Need for influenza vaccination       given today   Relevant Orders   Flu vaccine HIGH DOSE PF (Fluzone High dose) (Completed)       Follow up plan: Return in about 2 weeks (around 03/11/2018) for follow-up visit with Dr. Sanda Klein (check INR).  An after-visit summary was printed and given to the patient at [place].  Please see the patient instructions which may contain other information and recommendations beyond what is mentioned above in the assessment and plan.  Meds ordered this encounter  Medications  . atorvastatin (LIPITOR) 40 MG tablet    Sig: Take 1 tablet (40 mg total) by mouth at bedtime. For cholesterol; this replaces pravastatin    Dispense:  90 tablet    Refill:  0    Orders Placed This Encounter  Procedures  . Flu vaccine HIGH DOSE PF (Fluzone High dose)  . POCT INR

## 2018-02-25 NOTE — Assessment & Plan Note (Addendum)
On chronic anticoagulation; goal INR 2-3; discussed newer Factor Xa inhibitors, and will start that (transition) in about 2 months when he is running out of warfarin; discussed risks of strokes and clots, etc.

## 2018-02-26 NOTE — Assessment & Plan Note (Signed)
Discussed previous note about switching statins

## 2018-03-12 ENCOUNTER — Encounter: Payer: Self-pay | Admitting: Family Medicine

## 2018-03-12 ENCOUNTER — Ambulatory Visit (INDEPENDENT_AMBULATORY_CARE_PROVIDER_SITE_OTHER): Payer: Medicare Other | Admitting: Family Medicine

## 2018-03-12 VITALS — BP 120/76 | HR 99 | Temp 97.2°F | Ht 68.0 in | Wt 150.3 lb

## 2018-03-12 DIAGNOSIS — F419 Anxiety disorder, unspecified: Secondary | ICD-10-CM | POA: Diagnosis not present

## 2018-03-12 DIAGNOSIS — I480 Paroxysmal atrial fibrillation: Secondary | ICD-10-CM

## 2018-03-12 DIAGNOSIS — G459 Transient cerebral ischemic attack, unspecified: Secondary | ICD-10-CM | POA: Diagnosis not present

## 2018-03-12 DIAGNOSIS — Z5181 Encounter for therapeutic drug level monitoring: Secondary | ICD-10-CM | POA: Diagnosis not present

## 2018-03-12 DIAGNOSIS — Z7901 Long term (current) use of anticoagulants: Secondary | ICD-10-CM | POA: Diagnosis not present

## 2018-03-12 LAB — POCT INR: INR: 2.1 (ref 2.0–3.0)

## 2018-03-12 MED ORDER — ESCITALOPRAM OXALATE 10 MG PO TABS: 10.0000 mg | ORAL_TABLET | Freq: Every day | ORAL | 0 refills | Status: DC

## 2018-03-12 MED ORDER — WARFARIN SODIUM 2 MG PO TABS: ORAL_TABLET | ORAL | 5 refills | Status: DC

## 2018-03-12 NOTE — Patient Instructions (Signed)
Stop the paxil (paroxetine) Start lexapro (escitalopram)

## 2018-03-12 NOTE — Progress Notes (Signed)
BP 120/76   Pulse 99   Temp (!) 97.2 F (36.2 C) (Oral)   Ht 5\' 8"  (1.727 m)   Wt 150 lb 4.8 oz (68.2 kg)   SpO2 93%   BMI 22.85 kg/m    Subjective:    Patient ID: Christine Burnett, female    DOB: 05/09/1941, 78 y.o.   MRN: [id number]  HPI: Christine Burnett is a 77 y.o. female  Chief Complaint  Patient presents with  . Anticoagulation    INR:2.1  . Rash    feels bumpy comes and goes    HPI   She is here for INR check; chronic anticoagulation with goal INR 2-3 She has been taking 3 mg on Mondays and just 2 mg the rest of the week No bleeding from nose, gums, or in urine or stool  She thinks she might be allergic to paxil; I asked what her symptoms were; just "rash under my skin"; there in the morning, goes away through the day; no change in bedding; no trouble with breathing or lip swelling; red rash, looks red Other than paxil, she cannot recall other medicines; maybe 30 years ago, might have tried something else; using it mostly for anxiety No SI/HI  Depression screen [facility] 2/9 03/12/2018 02/25/2018 01/24/2018 11/08/2017 09/27/2017  Decreased Interest 0 0 0 0 0  Down, Depressed, Hopeless 0 0 1 0 0  PHQ - 2 Score 0 0 1 0 0  Altered sleeping 0 1 0 0 -  Tired, decreased energy 0 0 0 0 -  Change in appetite 0 0 0 0 -  Feeling bad or failure about yourself  0 0 0 0 -  Trouble concentrating 0 0 0 0 -  Moving slowly or fidgety/restless 0 0 0 0 -  Suicidal thoughts 0 0 0 0 -  PHQ-9 Score 0 1 1 0 -  Difficult doing work/chores Not difficult at all Not difficult at all Not difficult at all Not difficult at all -   Fall Risk  03/12/2018 02/25/2018 01/24/2018 11/08/2017 09/27/2017  Falls in the past year? No Yes Yes No No  Number falls in past yr: - 1 1 - -  Injury with Fall? - Yes No - -  Comment - - - - -  Risk Factor Category  - - - - -  Risk for fall due to : - - Impaired vision;Impaired balance/gait;Medication side effect - -  Risk for fall due to: Comment - - wears  eyeglasses; ambulates with cane - -  Follow up - - Falls evaluation completed;Education provided;Falls prevention discussed - -    Relevant past medical, surgical, family and social history reviewed Past Medical History:  Diagnosis Date  . A-fib ([place])   . Allergic rhinitis   . Anxiety   . Chronic bronchitis ([place])   . Chronic kidney disease, stage III (moderate) ([place]) 08/31/2017   Seeing nephrologist  . CKD (chronic kidney disease), stage III ([place])   . Cornea scar   . Depression    unspecified  . Diverticulosis   . Hyperlipidemia   . Hyperparathyroidism, secondary renal ([place]) 08/31/2017   Managed by nephrologist  . Hypertension   . Osteoporosis   . Risk for falls   . Situational disturbance   . TIA (transient ischemic attack)    Past Surgical History:  Procedure Laterality Date  . COLONOSCOPY  2016  . ESOPHAGUS SURGERY    . INTRAMEDULLARY (IM) NAIL INTERTROCHANTERIC Left 03/26/2017   Procedure: INTRAMEDULLARY (  IM) NAIL INTERTROCHANTRIC;  Surgeon: Hessie Knows, MD;  Location: [facility] ORS;  Service: Orthopedics;  Laterality: Left;   Family History  Problem Relation Age of Onset  . Other Mother        Uremeic posioning  . Heart attack Father   . Prostate cancer Father   . Heart attack Brother   . Diabetes Brother   . Hypertension Daughter   . Kidney cancer Neg Hx   . Bladder Cancer Neg Hx   . Colon cancer Neg Hx   . Colon polyps Neg Hx   . Rectal cancer Neg Hx    Social History   Tobacco Use  . Smoking status: Former Smoker    Packs/day: 0.25    Years: 61.00    Pack years: 15.25    Types: Cigarettes    Last attempt to quit: 03/25/2017    Years since quitting: 0.9  . Smokeless tobacco: Never Used  . Tobacco comment: smoking cessation materials not required  Substance Use Topics  . Alcohol use: No  . Drug use: No     [facility] Visit from 03/12/2018 in [facility]  AUDIT-C Score  0      Interim medical history since last visit  reviewed. Allergies and medications reviewed  Review of Systems Per HPI unless specifically indicated above     Objective:    BP 120/76   Pulse 99   Temp (!) 97.2 F (36.2 C) (Oral)   Ht 5\' 8"  (1.727 m)   Wt 150 lb 4.8 oz (68.2 kg)   SpO2 93%   BMI 22.85 kg/m   Wt Readings from Last 3 Encounters:  03/12/18 150 lb 4.8 oz (68.2 kg)  02/25/18 151 lb 11.2 oz (68.8 kg)  01/24/18 145 lb (65.8 kg)    Physical Exam  Constitutional: She appears well-developed and well-nourished.  HENT:  Mouth/Throat: Mucous membranes are normal.  Eyes: EOM are normal. No scleral icterus.  Cardiovascular: Normal rate and regular rhythm.  Pulmonary/Chest: Effort normal and breath sounds normal.  Skin: Rash (fine erythematous rash on trunk, abdomen) noted. No bruising and no ecchymosis noted.  Psychiatric: She has a normal mood and affect. Her behavior is normal.    Results for orders placed or performed in visit on 03/12/18  POCT INR  Result Value Ref Range   INR 2.1 2.0 - 3.0      Assessment & Plan:   Problem List Items Addressed This Visit      Cardiovascular and Mediastinum   TIA (transient ischemic attack)   Relevant Medications   warfarin (COUMADIN) 2 MG tablet   Atrial fibrillation ([facility]) - Primary   Relevant Medications   warfarin (COUMADIN) 2 MG tablet   Other Relevant Orders   POCT INR (Completed)     Other   Anticoagulation goal of INR 2 to 3   Relevant Medications   warfarin (COUMADIN) 2 MG tablet   Anxiety    Will switch agent; call with any issues      Relevant Medications   escitalopram (LEXAPRO) 10 MG tablet       Follow up plan: Return in about 1 month (around 04/12/2018) for follow-up visit with Dr. Sanda Klein.  An after-visit summary was printed and given to the patient at [place].  Please see the patient instructions which may contain other information and recommendations beyond what is mentioned above in the assessment and plan.  Meds ordered this encounter   Medications  . escitalopram (LEXAPRO) 10 MG tablet  Sig: Take 1 tablet (10 mg total) by mouth daily.    Dispense:  30 tablet    Refill:  0  . warfarin (COUMADIN) 2 MG tablet    Sig: Take 3 mg on Mondays and Thursdays, and 2 mg daily all other days    Dispense:  36 tablet    Refill:  5    Orders Placed This Encounter  Procedures  . POCT INR

## 2018-03-18 NOTE — Assessment & Plan Note (Signed)
Will switch agent; call with any issues

## 2018-04-11 DIAGNOSIS — N183 Chronic kidney disease, stage 3 (moderate): Secondary | ICD-10-CM | POA: Diagnosis not present

## 2018-04-11 DIAGNOSIS — N2581 Secondary hyperparathyroidism of renal origin: Secondary | ICD-10-CM | POA: Diagnosis not present

## 2018-04-12 ENCOUNTER — Encounter: Payer: Self-pay | Admitting: Family Medicine

## 2018-04-12 ENCOUNTER — Ambulatory Visit (INDEPENDENT_AMBULATORY_CARE_PROVIDER_SITE_OTHER): Payer: Medicare Other | Admitting: Family Medicine

## 2018-04-12 VITALS — BP 118/68 | HR 74 | Temp 97.8°F | Ht 68.0 in | Wt 150.3 lb

## 2018-04-12 DIAGNOSIS — I10 Essential (primary) hypertension: Secondary | ICD-10-CM

## 2018-04-12 DIAGNOSIS — L84 Corns and callosities: Secondary | ICD-10-CM

## 2018-04-12 DIAGNOSIS — Z5181 Encounter for therapeutic drug level monitoring: Secondary | ICD-10-CM

## 2018-04-12 DIAGNOSIS — I480 Paroxysmal atrial fibrillation: Secondary | ICD-10-CM

## 2018-04-12 DIAGNOSIS — Z7901 Long term (current) use of anticoagulants: Secondary | ICD-10-CM

## 2018-04-12 DIAGNOSIS — I6523 Occlusion and stenosis of bilateral carotid arteries: Secondary | ICD-10-CM

## 2018-04-12 DIAGNOSIS — G459 Transient cerebral ischemic attack, unspecified: Secondary | ICD-10-CM

## 2018-04-12 LAB — POCT INR: INR: 2.6 (ref 2.0–3.0)

## 2018-04-12 NOTE — Assessment & Plan Note (Signed)
Gorgeous BP today

## 2018-04-12 NOTE — Assessment & Plan Note (Signed)
Try to limit saturated fats; she will switch soon and we'll check fasting lipids 6-8 weeks after the switch

## 2018-04-12 NOTE — Patient Instructions (Addendum)
  Please do call and let us know when you are ready to switch your paxil and your pravastatin  Try to limit saturated fats in your diet (bologna, hot dogs, barbeque, cheeseburgers, hamburgers, steak, bacon, sausage, cheese, etc.) and get more fresh fruits, vegetables, and whole grains  Please call [phone] to schedule your imaging test (carotid ultrasound) Please wait 2-3 days after the order has been placed to call and get your test scheduled  Continue coumadin as before

## 2018-04-12 NOTE — Progress Notes (Signed)
BP 118/68   Pulse 74   Temp 97.8 F (36.6 C)   Ht 5\' 8"  (1.727 m)   Wt 150 lb 4.8 oz (68.2 kg)   SpO2 95%   BMI 22.85 kg/m    Subjective:    Patient ID: Christine Burnett, female    DOB: 1940-06-16, 77 y.o.   MRN: [id number]  HPI: Christine Burnett is a 77 y.o. female  Chief Complaint  Patient presents with  . Follow-up  . Labs Only    INR: 2.6   HPI Patient is here for INR monitoring INR was 2.6 today; no bleeding from anywhere Taking medicine as prescribed; no missed doses She wishes to stay on the warfarin; on that for TIA; no new neurologic complaints Does not really take tylenol, very rare; does not get headaches really Dr. Neoma Laming monitored her carotids; she does not intend to go back  Carotid US  IMPRESSION:  Mild plaque at the level of the distal right common carotid artery,  bulb and proximal right ICA. Estimated right ICA stenosis is less  than 50%. Moderate plaque at the level of the left carotid bulb and  proximal ICA. Estimated left ICA stenosis is less than 50%.    Electronically Signed    By: Aletta Edouard M.D.    On: 07/03/2014 10:14   She has not started the lexapro, and is still taking paxil She has not started the atorvastatin, and is still taking pravastatin Mostly chicken and fish; had a cheeseburger the other day; does eat mayo  Does not feel heart beating fast; does not feel nervous or anxious  She asked for a referral to a podiatrists; calluses on both feet; had hammer toes and a bunion, those are resolved, just problems with calluses  Depression screen [facility] 2/9 04/12/2018 03/12/2018 02/25/2018 01/24/2018 11/08/2017  Decreased Interest 0 0 0 0 0  Down, Depressed, Hopeless 0 0 0 1 0  PHQ - 2 Score 0 0 0 1 0  Altered sleeping 0 0 1 0 0  Tired, decreased energy 0 0 0 0 0  Change in appetite 0 0 0 0 0  Feeling bad or failure about yourself  0 0 0 0 0  Trouble concentrating 0 0 0 0 0  Moving slowly or fidgety/restless 0 0 0 0 0    Suicidal thoughts 0 0 0 0 0  PHQ-9 Score 0 0 1 1 0  Difficult doing work/chores Not difficult at all Not difficult at all Not difficult at all Not difficult at all Not difficult at all  Some recent data might be hidden   Fall Risk  04/12/2018 03/12/2018 02/25/2018 01/24/2018 11/08/2017  Falls in the past year? 0 No Yes Yes No  Number falls in past yr: - - 1 1 -  Injury with Fall? - - Yes No -  Comment - - - - -  Risk Factor Category  - - - - -  Risk for fall due to : - - - Impaired vision;Impaired balance/gait;Medication side effect -  Risk for fall due to: Comment - - - wears eyeglasses; ambulates with cane -  Follow up - - - Falls evaluation completed;Education provided;Falls prevention discussed -    Relevant past medical, surgical, family and social history reviewed Past Medical History:  Diagnosis Date  . A-fib ([place])   . Allergic rhinitis   . Anxiety   . Chronic bronchitis ([place])   . Chronic kidney disease, stage III (moderate) (HCC) 08/31/2017  Seeing nephrologist  . CKD (chronic kidney disease), stage III ([place])   . Cornea scar   . Depression    unspecified  . Diverticulosis   . Hyperlipidemia   . 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 Hypertension   . Osteoporosis   . Risk for falls   . Situational disturbance   . TIA (transient ischemic attack)    Past Surgical History:  Procedure Laterality Date  . COLONOSCOPY  2016  . ESOPHAGUS SURGERY    . INTRAMEDULLARY (IM) NAIL INTERTROCHANTERIC Left 03/26/2017   Procedure: INTRAMEDULLARY (IM) NAIL INTERTROCHANTRIC;  Surgeon: Hessie Knows, MD;  Location: [facility] ORS;  Service: Orthopedics;  Laterality: Left;   Family History  Problem Relation Age of Onset  . Other Mother        Uremeic posioning  . Heart attack Father   . Prostate cancer Father   . Heart attack Brother   . Diabetes Brother   . Hypertension Daughter   . Kidney cancer Neg Hx   . Bladder Cancer Neg Hx   . Colon  cancer Neg Hx   . Colon polyps Neg Hx   . Rectal cancer Neg Hx    Social History   Tobacco Use  . Smoking status: Former Smoker    Packs/day: 0.25    Years: 61.00    Pack years: 15.25    Types: Cigarettes    Last attempt to quit: 03/25/2017    Years since quitting: 1.0  . Smokeless tobacco: Never Used  . Tobacco comment: smoking cessation materials not required  Substance Use Topics  . Alcohol use: No  . Drug use: No     Office Visit from 04/12/2018 in [facility]  AUDIT-C Score  0      Interim medical history since last visit reviewed. Allergies and medications reviewed  Review of Systems Per HPI unless specifically indicated above     Objective:    BP 118/68   Pulse 74   Temp 97.8 F (36.6 C)   Ht 5\' 8"  (1.727 m)   Wt 150 lb 4.8 oz (68.2 kg)   SpO2 95%   BMI 22.85 kg/m   Wt Readings from Last 3 Encounters:  04/12/18 150 lb 4.8 oz (68.2 kg)  03/12/18 150 lb 4.8 oz (68.2 kg)  02/25/18 151 lb 11.2 oz (68.8 kg)    Physical Exam  Constitutional: She appears well-developed and well-nourished.  HENT:  Mouth/Throat: Mucous membranes are normal.  Eyes: EOM are normal. No scleral icterus.  Neck: Carotid bruit is not present.  Cardiovascular: Normal rate and regular rhythm.  Pulmonary/Chest: Effort normal and breath sounds normal.  Psychiatric: She has a normal mood and affect. Her behavior is normal.    Results for orders placed or performed in visit on 04/12/18  POCT INR  Result Value Ref Range   INR 2.6 2.0 - 3.0      Assessment & Plan:   Problem List Items Addressed This Visit      Cardiovascular and Mediastinum   Carotid artery plaque   Relevant Medications   pravastatin (PRAVACHOL) 20 MG tablet   Other Relevant Orders   US Carotid Bilateral   Atrial fibrillation (HCC)   Relevant Medications   pravastatin (PRAVACHOL) 20 MG tablet   TIA (transient ischemic attack)    Try to limit saturated fats; she will switch soon and  we'll check fasting lipids 6-8 weeks after the switch      Relevant  Medications   pravastatin (PRAVACHOL) 20 MG tablet   Other Relevant Orders   POCT INR (Completed)   HTN (hypertension)    Gorgeous BP today      Relevant Medications   pravastatin (PRAVACHOL) 20 MG tablet     Other   Anticoagulation goal of INR 2 to 3 - Primary   Relevant Orders   POCT INR (Completed)    Other Visit Diagnoses    Callus of foot       Relevant Orders   Ambulatory referral to Podiatry       Follow up plan: Return in about 1 month (around 05/12/2018) for follow-up visit with Dr. Sanda Klein for INR check.  An after-visit summary was printed and given to the patient at [place].  Please see the patient instructions which may contain other information and recommendations beyond what is mentioned above in the assessment and plan.  No orders of the defined types were placed in this encounter.   Orders Placed This Encounter  Procedures  . US Carotid Bilateral  . Ambulatory referral to Podiatry  . POCT INR

## 2018-04-22 ENCOUNTER — Telehealth: Payer: Self-pay | Admitting: Family Medicine

## 2018-04-22 NOTE — Telephone Encounter (Signed)
Copied from [place] [id number]. Topic: Quick Communication - Rx Refill/Question >> Apr 22, 2018 12:01 PM Margot Ables wrote: Medication:  pt calling for medications to be changed. Per OV notes 04/12/18 with Dr. Call call when "ready to switch your paxil and your pravastatin". Please advise pt of medications being changed to.  Has the patient contacted their pharmacy? No - change of RX Preferred Pharmacy (with phone number or street name):   [facility] [address] [place] [phone] (Phone) [phone] (Fax)

## 2018-04-23 MED ORDER — ATORVASTATIN CALCIUM 40 MG PO TABS: 40.0000 mg | ORAL_TABLET | Freq: Every day | ORAL | 0 refills | Status: DC

## 2018-04-23 MED ORDER — ESCITALOPRAM OXALATE 10 MG PO TABS: 10.0000 mg | ORAL_TABLET | Freq: Every day | ORAL | 0 refills | Status: DC

## 2018-04-23 NOTE — Telephone Encounter (Signed)
New prescriptions sent

## 2018-04-25 ENCOUNTER — Ambulatory Visit (INDEPENDENT_AMBULATORY_CARE_PROVIDER_SITE_OTHER): Payer: Medicare Other | Admitting: Podiatry

## 2018-04-25 ENCOUNTER — Encounter: Payer: Self-pay | Admitting: Podiatry

## 2018-04-25 VITALS — BP 158/83 | HR 80

## 2018-04-25 DIAGNOSIS — M79674 Pain in right toe(s): Secondary | ICD-10-CM | POA: Diagnosis not present

## 2018-04-25 DIAGNOSIS — I6523 Occlusion and stenosis of bilateral carotid arteries: Secondary | ICD-10-CM | POA: Diagnosis not present

## 2018-04-25 DIAGNOSIS — L84 Corns and callosities: Secondary | ICD-10-CM

## 2018-04-25 DIAGNOSIS — B351 Tinea unguium: Secondary | ICD-10-CM | POA: Diagnosis not present

## 2018-04-25 DIAGNOSIS — M79675 Pain in left toe(s): Secondary | ICD-10-CM

## 2018-04-25 DIAGNOSIS — M2012 Hallux valgus (acquired), left foot: Secondary | ICD-10-CM

## 2018-04-25 DIAGNOSIS — M216X9 Other acquired deformities of unspecified foot: Secondary | ICD-10-CM

## 2018-04-25 DIAGNOSIS — M2011 Hallux valgus (acquired), right foot: Secondary | ICD-10-CM

## 2018-04-25 NOTE — Progress Notes (Signed)
This patient presents to the office with chief complaint of painful calluses on both feet as well as painful long thick nails.  Patient states the nails in her big toes are painful and growing into the nail borders.  She also says she has multiple calluses on both feet which make it difficult to walk.  She says she has had multiple surgeries performed on her feet but the deformities seem to be returning.  She says she has wires in all the toes on both feet except her fifth toes.  Patient is also taking Coumadin.  She presents the office today for an evaluation and treatment of her painful feet  General Appearance  Alert, conversant and in no acute stress.  Vascular  Dorsalis pedis and posterior tibial  pulses are palpable  bilaterally.  Capillary return is within normal limits  bilaterally. Temperature is within normal limits  bilaterally.  Neurologic  Senn-Weinstein monofilament wire test within normal limits  bilaterally. Muscle power within normal limits bilaterally.  Nails Thick disfigured discolored nails with subungual debris  from hallux to fifth toes bilaterally. No evidence of bacterial infection or drainage bilaterally.  Orthopedic  No limitations of motion  feet .  No crepitus or effusions noted.  HAV  B/L with hammer toes  B/L. Exostosis 1st MCJ  B/L  Skin  normotropic skin with no porokeratosis noted bilaterally.  No signs of infections or ulcers noted.  Callus sub 1,5  B/L.  Callus hallux  B/L  Onychomycosis  B/L  Callus  B/L  IE.  Debride nails  X 2.  Debride multiple callus  B/L.  RTC 3 months.   Gardiner Barefoot DPM

## 2018-05-13 ENCOUNTER — Ambulatory Visit (INDEPENDENT_AMBULATORY_CARE_PROVIDER_SITE_OTHER): Payer: Medicare Other | Admitting: Family Medicine

## 2018-05-13 ENCOUNTER — Encounter: Payer: Self-pay | Admitting: Family Medicine

## 2018-05-13 VITALS — BP 136/74 | HR 85 | Temp 97.8°F | Ht 68.0 in | Wt 152.4 lb

## 2018-05-13 DIAGNOSIS — E538 Deficiency of other specified B group vitamins: Secondary | ICD-10-CM | POA: Diagnosis not present

## 2018-05-13 DIAGNOSIS — Z5181 Encounter for therapeutic drug level monitoring: Secondary | ICD-10-CM

## 2018-05-13 DIAGNOSIS — I6523 Occlusion and stenosis of bilateral carotid arteries: Secondary | ICD-10-CM

## 2018-05-13 DIAGNOSIS — Z7901 Long term (current) use of anticoagulants: Secondary | ICD-10-CM

## 2018-05-13 DIAGNOSIS — L309 Dermatitis, unspecified: Secondary | ICD-10-CM | POA: Diagnosis not present

## 2018-05-13 DIAGNOSIS — I480 Paroxysmal atrial fibrillation: Secondary | ICD-10-CM

## 2018-05-13 LAB — POCT INR: INR: 2.1 (ref 2.0–3.0)

## 2018-05-13 MED ORDER — CYANOCOBALAMIN 1000 MCG/ML IJ SOLN: 1000.0000 ug | Freq: Once | INTRAMUSCULAR | Status: DC

## 2018-05-13 MED ORDER — VITAMIN B-12 500 MCG SL SUBL: SUBLINGUAL_TABLET | SUBLINGUAL | 3 refills | Status: DC

## 2018-05-13 MED ORDER — ESCITALOPRAM OXALATE 10 MG PO TABS: 5.0000 mg | ORAL_TABLET | Freq: Every day | ORAL | 0 refills | Status: DC

## 2018-05-13 MED ORDER — EZETIMIBE 10 MG PO TABS: 10.0000 mg | ORAL_TABLET | Freq: Every day | ORAL | 11 refills | Status: DC

## 2018-05-13 MED ORDER — CYANOCOBALAMIN 1000 MCG/ML IJ SOLN
1000.0000 ug | Freq: Once | INTRAMUSCULAR | Status: AC
  Administered 2018-05-13: 1000 ug via INTRAMUSCULAR

## 2018-05-13 NOTE — Assessment & Plan Note (Signed)
Continue coumadin; no changes, continue, next INR in one month

## 2018-05-13 NOTE — Progress Notes (Signed)
BP 136/74   Pulse 85   Temp 97.8 F (36.6 C) (Oral)   Ht 5\' 8"  (3.151 m)   Wt 152 lb 6.4 oz (69.1 kg)   SpO2 95%   BMI 23.17 kg/m    Subjective:    Patient ID: Christine Burnett, female    DOB: 1941-02-11, 77 y.o.   MRN: [id number]  HPI: Christine Burnett is a 77 y.o. female  Chief Complaint  Patient presents with  . Anticoagulation    2.1    HPI Patient is here for f/u  She does not like the new medicine; stopped the two medicines on Friday; joint aches, fatigue, and diarrhea New med was atorvastatin likely causing joint aches; she also stopped the lexapro; she thought she had a rash from paxil, but still has the rash; rash is all over; when she takes a shower and it makes it worse, really bright red; she sees it more in the morning because she is warm; back of the neck itches; rash has been going on for months  She has not had her coumadin for two days and was taking it as directed prior to that; just ran out, needs to get it; no bleeding from anywhere; wishes to continue  Mood is okay but she is tired  Prediabetes; last A1c was 5.7  CKD, stage 3; seeing Dr. Holley Raring; he wants her to take vitamin D; he has done labs; last Cr here was 1.4 in April; last CBC was normal; secondary renal hyperparathyroidism  Vitamin B12 was 336  She was on the SSRI for anxiety; no SI/HI  Depression screen [facility] 2/9 05/13/2018 04/12/2018 03/12/2018 02/25/2018 01/24/2018  Decreased Interest 0 0 0 0 0  Down, Depressed, Hopeless 0 0 0 0 1  PHQ - 2 Score 0 0 0 0 1  Altered sleeping 0 0 0 1 0  Tired, decreased energy 0 0 0 0 0  Change in appetite 0 0 0 0 0  Feeling bad or failure about yourself  0 0 0 0 0  Trouble concentrating 0 0 0 0 0  Moving slowly or fidgety/restless 0 0 0 0 0  Suicidal thoughts 0 0 0 0 0  PHQ-9 Score 0 0 0 1 1  Difficult doing work/chores Not difficult at all Not difficult at all Not difficult at all Not difficult at all Not difficult at all  Some recent data might be  hidden   Fall Risk  05/13/2018 04/12/2018 03/12/2018 02/25/2018 01/24/2018  Falls in the past year? 0 0 No Yes Yes  Number falls in past yr: - - - 1 1  Injury with Fall? - - - Yes No  Comment - - - - -  Risk Factor Category  - - - - -  Risk for fall due to : - - - - Impaired vision;Impaired balance/gait;Medication side effect  Risk for fall due to: Comment - - - - wears eyeglasses; ambulates with cane  Follow up - - - - Falls evaluation completed;Education provided;Falls prevention discussed    Relevant past medical, surgical, family and social history reviewed Past Medical History:  Diagnosis Date  . A-fib ([place])   . Allergic rhinitis   . Anxiety   . Chronic bronchitis ([place])   . Chronic kidney disease, stage III (moderate) ([place]) 08/31/2017   Seeing nephrologist  . CKD (chronic kidney disease), stage III ([place])   . Cornea scar   . Depression    unspecified  . Diverticulosis   .  Hyperlipidemia   . 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 Hypertension   . Osteoporosis   . Risk for falls   . Situational disturbance   . TIA (transient ischemic attack)    Past Surgical History:  Procedure Laterality Date  . COLONOSCOPY  2016  . ESOPHAGUS SURGERY    . INTRAMEDULLARY (IM) NAIL INTERTROCHANTERIC Left 03/26/2017   Procedure: INTRAMEDULLARY (IM) NAIL INTERTROCHANTRIC;  Surgeon: Hessie Knows, MD;  Location: [facility] ORS;  Service: Orthopedics;  Laterality: Left;   Family History  Problem Relation Age of Onset  . Other Mother        Uremeic posioning  . Heart attack Father   . Prostate cancer Father   . Heart attack Brother   . Diabetes Brother   . Hypertension Daughter   . Kidney cancer Neg Hx   . Bladder Cancer Neg Hx   . Colon cancer Neg Hx   . Colon polyps Neg Hx   . Rectal cancer Neg Hx    Social History   Tobacco Use  . Smoking status: Former Smoker    Packs/day: 0.25    Years: 61.00    Pack years: 15.25    Types: Cigarettes     Last attempt to quit: 03/25/2017    Years since quitting: 1.1  . Smokeless tobacco: Never Used  . Tobacco comment: smoking cessation materials not required  Substance Use Topics  . Alcohol use: No  . Drug use: No     Office Visit from 05/13/2018 in [facility]  AUDIT-C Score  0      Interim medical history since last visit reviewed. Allergies and medications reviewed  Review of Systems Per HPI unless specifically indicated above     Objective:    BP 136/74   Pulse 85   Temp 97.8 F (36.6 C) (Oral)   Ht 5\' 8"  (1.727 m)   Wt 152 lb 6.4 oz (69.1 kg)   SpO2 95%   BMI 23.17 kg/m   Wt Readings from Last 3 Encounters:  05/13/18 152 lb 6.4 oz (69.1 kg)  04/12/18 150 lb 4.8 oz (68.2 kg)  03/12/18 150 lb 4.8 oz (68.2 kg)    Physical Exam  Constitutional: She appears well-developed and well-nourished.  HENT:  Mouth/Throat: Mucous membranes are normal.  Eyes: EOM are normal. No scleral icterus.  Cardiovascular: Normal rate and regular rhythm.  (she sounds to be in NSR today)  Pulmonary/Chest: Effort normal and breath sounds normal.  Psychiatric: She has a normal mood and affect. Her behavior is normal. Her mood appears not anxious. She does not exhibit a depressed mood.    Results for orders placed or performed in visit on 05/13/18  POCT INR  Result Value Ref Range   INR 2.1 2.0 - 3.0      Assessment & Plan:   Problem List Items Addressed This Visit      Cardiovascular and Mediastinum   Carotid artery plaque    Stop statin; start zetia      Relevant Medications   ezetimibe (ZETIA) 10 MG tablet   Atrial fibrillation (HCC)    Continue coumadin; no changes, continue, next INR in one month      Relevant Medications   ezetimibe (ZETIA) 10 MG tablet     Other   Anticoagulation goal of INR 2 to 3 - Primary    Continue warfarin; return in one month      Relevant Orders   POCT  INR (Completed)    Other Visit Diagnoses    Dermatitis        Relevant Orders   Ambulatory referral to Dermatology   Vitamin B12 deficiency       Relevant Medications   cyanocobalamin ((VITAMIN B-12)) injection 1,000 mcg (Completed)       Follow up plan: Return in about 1 month (around 06/13/2018) for follow-up visit with Dr. Sanda Klein.  An after-visit summary was printed and given to the patient at [place].  Please see the patient instructions which may contain other information and recommendations beyond what is mentioned above in the assessment and plan.  Meds ordered this encounter  Medications  . DISCONTD: cyanocobalamin ((VITAMIN B-12)) injection 1,000 mcg  . Cyanocobalamin (VITAMIN B-12) 500 MCG SUBL    Sig: One tablet dissolved under the tongue once a day    Dispense:  100 tablet    Refill:  3  . ezetimibe (ZETIA) 10 MG tablet    Sig: Take 1 tablet (10 mg total) by mouth daily.    Dispense:  30 tablet    Refill:  11  . escitalopram (LEXAPRO) 10 MG tablet    Sig: Take 0.5 tablets (5 mg total) by mouth daily.    Dispense:  15 tablet    Refill:  0  . cyanocobalamin ((VITAMIN B-12)) injection 1,000 mcg    Orders Placed This Encounter  Procedures  . Ambulatory referral to Dermatology  . POCT INR

## 2018-05-13 NOTE — Patient Instructions (Addendum)
Stop the statin Start the zetia (ezetimibe) Try to limit saturated fats in your diet (bologna, hot dogs, barbeque, cheeseburgers, hamburgers, steak, bacon, sausage, cheese, etc.) and get more fresh fruits, vegetables, and whole grains We'll have you see Dr. Aubery Lapping Start the vitamin B12 Start back on lower dose Lexapro (escitalopram)

## 2018-05-13 NOTE — Assessment & Plan Note (Signed)
Stop statin; start zetia

## 2018-05-13 NOTE — Assessment & Plan Note (Signed)
Continue warfarin; return in one month

## 2018-06-13 ENCOUNTER — Ambulatory Visit (INDEPENDENT_AMBULATORY_CARE_PROVIDER_SITE_OTHER): Payer: Medicare Other | Admitting: Family Medicine

## 2018-06-13 ENCOUNTER — Encounter: Payer: Self-pay | Admitting: Family Medicine

## 2018-06-13 VITALS — BP 118/70 | HR 97 | Temp 97.7°F | Ht 68.0 in | Wt 150.4 lb

## 2018-06-13 DIAGNOSIS — F339 Major depressive disorder, recurrent, unspecified: Secondary | ICD-10-CM | POA: Insufficient documentation

## 2018-06-13 DIAGNOSIS — I480 Paroxysmal atrial fibrillation: Secondary | ICD-10-CM

## 2018-06-13 DIAGNOSIS — Z5181 Encounter for therapeutic drug level monitoring: Secondary | ICD-10-CM

## 2018-06-13 DIAGNOSIS — F331 Major depressive disorder, recurrent, moderate: Secondary | ICD-10-CM

## 2018-06-13 DIAGNOSIS — Z7901 Long term (current) use of anticoagulants: Secondary | ICD-10-CM | POA: Diagnosis not present

## 2018-06-13 LAB — POCT INR: INR: 2.4 (ref 2.0–3.0)

## 2018-06-13 MED ORDER — PAROXETINE HCL 40 MG PO TABS: 40.0000 mg | ORAL_TABLET | ORAL | 1 refills | Status: DC
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
## 2018-06-13 NOTE — Assessment & Plan Note (Signed)
Sounds to be in NSR today; patient does not want to change to Factor Xa inhibitor; continue coumadin goal INR 2-3

## 2018-06-13 NOTE — Patient Instructions (Signed)
Stop escitalopram Start back on paroxetine  Here are some resources to help you if you feel you are in a mental health crisis:  [place] - Call [phone]  for help - Website with more resources: GripTrip.com.pt  [facility] - Call [phone] for help. - Mobile Crisis Program available 24 hours a day, 365 days a year. - Available for anyone of any age in [place] counties.  [facility] - Address: [address], [place] - Telephone: [phone]  - Hours of Operation: Sunday - Saturday - 8:00 a.m. - 8:00 p.m. - Medicaid, Medicare (Government Issued Only), BCBS, and [place], [place], Psychiatrists on-site to provide medication management, [place], and Peer Support Care.  [facility] - Call [phone] for help. - Mobile Crisis Program available 24 hours a day, 365 days a year. - Available for anyone of any age in [place]

## 2018-06-13 NOTE — Assessment & Plan Note (Signed)
Agree with changing her medicine; resources given; she verbally agrees to get help if dark thoughts arise, but promises she would never hurt herself; close f/u with me; she sounded hopeful and had improved mood at the end of our time together today

## 2018-06-13 NOTE — Assessment & Plan Note (Signed)
At goal; continue current dose, recheck in one month

## 2018-06-27 DIAGNOSIS — R231 Pallor: Secondary | ICD-10-CM | POA: Diagnosis not present

## 2018-06-27 DIAGNOSIS — L573 Poikiloderma of Civatte: Secondary | ICD-10-CM | POA: Diagnosis not present

## 2018-06-28 ENCOUNTER — Ambulatory Visit (INDEPENDENT_AMBULATORY_CARE_PROVIDER_SITE_OTHER): Payer: Medicare Other | Admitting: Family Medicine

## 2018-06-28 ENCOUNTER — Encounter: Payer: Self-pay | Admitting: Family Medicine

## 2018-06-28 DIAGNOSIS — E782 Mixed hyperlipidemia: Secondary | ICD-10-CM

## 2018-06-28 DIAGNOSIS — F330 Major depressive disorder, recurrent, mild: Secondary | ICD-10-CM

## 2018-06-28 NOTE — Assessment & Plan Note (Signed)
Much improved; continue current medicine; call with any issues

## 2018-06-28 NOTE — Progress Notes (Signed)
BP 122/74   Pulse 99   Temp 97.6 F (36.4 C)   Ht 5\' 8"  (1.727 m)   Wt 151 lb 9.6 oz (68.8 kg)   SpO2 99%   BMI 23.05 kg/m    Subjective:    Patient ID: Christine Burnett, female    DOB: [birth year], 78 y.o.   MRN: [id number]  HPI: Christine Burnett is a 78 y.o. female  Chief Complaint  Patient presents with  . Follow-up    HPI Patient is here for f/u  She was depressed at the last visit Med was increased, started feeling better within 2 days Sleeping better Gets up 3x a night to urinate, but normal for her and not bothersome She is very satisfied with the adjustment in her medicine  On zetia; eating more veggies and fruits; reviewed last lipids; not much red meats; some fried foods at home, olive oil  Saw dermatologist yeserday; nothing to worry about, normal; shows veins when she is hot; skin discoloration  Depression screen [facility] 2/9 06/28/2018 06/13/2018 05/13/2018 04/12/2018 03/12/2018  Decreased Interest 0 2 0 0 0  Down, Depressed, Hopeless 0 3 0 0 0  PHQ - 2 Score 0 5 0 0 0  Altered sleeping 0 3 0 0 0  Tired, decreased energy 0 2 0 0 0  Change in appetite 0 2 0 0 0  Feeling bad or failure about yourself  0 2 0 0 0  Trouble concentrating 0 0 0 0 0  Moving slowly or fidgety/restless 0 0 0 0 0  Suicidal thoughts 0 2 0 0 0  PHQ-9 Score 0 16 0 0 0  Difficult doing work/chores Not difficult at all Somewhat difficult Not difficult at all Not difficult at all Not difficult at all  Some recent data might be hidden   Fall Risk  06/28/2018 06/13/2018 05/13/2018 04/12/2018 03/12/2018  Falls in the past year? 0 0 0 0 No  Number falls in past yr: - - - - -  Injury with Fall? - - - - -  Comment - - - - -  Risk Factor Category  - - - - -  Risk for fall due to : - - - - -  Risk for fall due to: Comment - - - - -  Follow up - - - - -    Relevant past medical, surgical, family and social history reviewed Past Medical History:  Diagnosis Date  . A-fib ([place])   . Allergic  rhinitis   . Anxiety   . Chronic bronchitis ([place])   . Chronic kidney disease, stage III (moderate) ([place]) 08/31/2017   Seeing nephrologist  . CKD (chronic kidney disease), stage III ([place])   . Cornea scar   . Depression    unspecified  . Diverticulosis   . Hyperlipidemia   . 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 Hypertension   . Osteoporosis   . Risk for falls   . Situational disturbance   . TIA (transient ischemic attack)    Past Surgical History:  Procedure Laterality Date  . COLONOSCOPY  2016  . ESOPHAGUS SURGERY    . INTRAMEDULLARY (IM) NAIL INTERTROCHANTERIC Left 03/26/2017   Procedure: INTRAMEDULLARY (IM) NAIL INTERTROCHANTRIC;  Surgeon: Hessie Knows, MD;  Location: [facility] ORS;  Service: Orthopedics;  Laterality: Left;   Family History  Problem Relation Age of Onset  . Other Mother        Uremeic posioning  . Heart  attack Father   . Prostate cancer Father   . Heart attack Brother   . Diabetes Brother   . Hypertension Daughter   . Kidney cancer Neg Hx   . Bladder Cancer Neg Hx   . Colon cancer Neg Hx   . Colon polyps Neg Hx   . Rectal cancer Neg Hx    Social History   Tobacco Use  . Smoking status: Former Smoker    Packs/day: 0.25    Years: 61.00    Pack years: 15.25    Types: Cigarettes    Last attempt to quit: 03/25/2017    Years since quitting: 1.2  . Smokeless tobacco: Never Used  . Tobacco comment: smoking cessation materials not required  Substance Use Topics  . Alcohol use: No  . Drug use: No     [facility] Visit from 06/28/2018 in [facility]  AUDIT-C Score  0      Interim medical history since last visit reviewed. Allergies and medications reviewed  Review of Systems Per HPI unless specifically indicated above     Objective:    BP 122/74   Pulse 99   Temp 97.6 F (36.4 C)   Ht 5\' 8"  (1.727 m)   Wt 151 lb 9.6 oz (68.8 kg)   SpO2 99%   BMI 23.05 kg/m   Wt Readings from  Last 3 Encounters:  06/28/18 151 lb 9.6 oz (68.8 kg)  06/13/18 150 lb 6.4 oz (68.2 kg)  05/13/18 152 lb 6.4 oz (69.1 kg)    Physical Exam Constitutional:      Appearance: She is well-developed.  Eyes:     General: No scleral icterus. Cardiovascular:     Rate and Rhythm: Normal rate and regular rhythm.  Pulmonary:     Effort: Pulmonary effort is normal.     Breath sounds: Normal breath sounds.  Psychiatric:        Mood and Affect: Mood normal.        Behavior: Behavior normal.        Thought Content: Thought content normal.        Judgment: Judgment normal.     Results for orders placed or performed in visit on 06/13/18  POCT INR  Result Value Ref Range   INR 2.4 2.0 - 3.0      Assessment & Plan:   Problem List Items Addressed This Visit      Other   Major depressive disorder, recurrent ([place])    Much improved; continue current medicine; call with any issues      Hyperlipidemia    Continue zetia; plan to check lipids 6 months after last          Follow up plan: Return in about 17 days (around 07/15/2018) for follow-up visit with Dr. Sanda Klein for INR.  An after-visit summary was printed and given to the patient at [place].  Please see the patient instructions which may contain other information and recommendations beyond what is mentioned above in the assessment and plan.  No orders of the defined types were placed in this encounter.   No orders of the defined types were placed in this encounter.

## 2018-06-28 NOTE — Patient Instructions (Addendum)
Keep up the good work with healthy eating Explore new veggies and fruits

## 2018-06-28 NOTE — Assessment & Plan Note (Signed)
Continue zetia; plan to check lipids 6 months after last

## 2018-07-15 ENCOUNTER — Ambulatory Visit (INDEPENDENT_AMBULATORY_CARE_PROVIDER_SITE_OTHER): Payer: Medicare Other | Admitting: Family Medicine

## 2018-07-15 ENCOUNTER — Encounter: Payer: Self-pay | Admitting: Family Medicine

## 2018-07-15 VITALS — BP 118/70 | HR 57 | Temp 97.6°F | Ht 68.0 in | Wt 150.5 lb

## 2018-07-15 DIAGNOSIS — Z7901 Long term (current) use of anticoagulants: Secondary | ICD-10-CM

## 2018-07-15 DIAGNOSIS — I480 Paroxysmal atrial fibrillation: Secondary | ICD-10-CM | POA: Diagnosis not present

## 2018-07-15 DIAGNOSIS — Z5181 Encounter for therapeutic drug level monitoring: Secondary | ICD-10-CM | POA: Diagnosis not present

## 2018-07-15 LAB — POCT INR: INR: 3.3 — AB (ref 2.0–3.0)

## 2018-07-15 NOTE — Progress Notes (Signed)
BP 118/70   Pulse (!) 57   Temp 97.6 F (36.4 C)   Ht 5\' 8"  (2.831 m)   Wt 150 lb 8 oz (68.3 kg)   SpO2 94%   BMI 22.88 kg/m    Subjective:    Patient ID: Christine Burnett, female    DOB: 1941-04-30, 78 y.o.   MRN: [id number]  HPI: Christine Burnett is a 78 y.o. female  Chief Complaint  Patient presents with  . Follow-up    HPI Patient is here for f/u for her INR She has atrial fibrillation and a hx of a TIA  Lab Results  Component Value Date   INR 3.3 (A) 07/15/2018   INR 2.4 06/13/2018   INR 2.1 05/13/2018   Current regimen of coumadin: Monday: 1.5 Tuesday: 1 Wed: 1 Thursday: 1.5 Friday: 1 Sat: 1 Sun: 1  Taking vitamin D 2000 iu Vitamin B12 1000 mcg Stable vitamins  Taking tylenol every night for the last week and a half Daughter borrowed her car and she smokes, couldn't stand the smell, sprayed the car and got a sinus infection; taking medicine for the last week She thinks it was the tylenol and she will stop that and can sleep now  She had a burrito at [employer] No bleeding or bruising  Depression screen [facility] 2/9 07/15/2018 06/28/2018 06/13/2018 05/13/2018 04/12/2018  Decreased Interest 0 0 2 0 0  Down, Depressed, Hopeless 0 0 3 0 0  PHQ - 2 Score 0 0 5 0 0  Altered sleeping 0 0 3 0 0  Tired, decreased energy 0 0 2 0 0  Change in appetite 0 0 2 0 0  Feeling bad or failure about yourself  0 0 2 0 0  Trouble concentrating 0 0 0 0 0  Moving slowly or fidgety/restless 0 0 0 0 0  Suicidal thoughts 0 0 2 0 0  PHQ-9 Score 0 0 16 0 0  Difficult doing work/chores Not difficult at all Not difficult at all Somewhat difficult Not difficult at all Not difficult at all  Some recent data might be hidden   Fall Risk  06/28/2018 06/13/2018 05/13/2018 04/12/2018 03/12/2018  Falls in the past year? 0 0 0 0 No  Number falls in past yr: - - - - -  Injury with Fall? - - - - -  Comment - - - - -  Risk Factor Category  - - - - -  Risk for fall due to : - - - - -    Risk for fall due to: Comment - - - - -  Follow up - - - - -    Relevant past medical, surgical, family and social history reviewed Past Medical History:  Diagnosis Date  . A-fib ([place])   . Allergic rhinitis   . Anxiety   . Chronic bronchitis ([place])   . Chronic kidney disease, stage III (moderate) ([place]) 08/31/2017   Seeing nephrologist  . CKD (chronic kidney disease), stage III ([place])   . Cornea scar   . Depression    unspecified  . Diverticulosis   . Hyperlipidemia   . 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 Hypertension   . Osteoporosis   . Risk for falls   . Situational disturbance   . TIA (transient ischemic attack)    Past Surgical History:  Procedure Laterality Date  . COLONOSCOPY  2016  . ESOPHAGUS SURGERY    . INTRAMEDULLARY (IM)  NAIL INTERTROCHANTERIC Left 03/26/2017   Procedure: INTRAMEDULLARY (IM) NAIL INTERTROCHANTRIC;  Surgeon: Hessie Knows, MD;  Location: [facility] ORS;  Service: Orthopedics;  Laterality: Left;   Family History  Problem Relation Age of Onset  . Other Mother        Uremeic posioning  . Heart attack Father   . Prostate cancer Father   . Heart attack Brother   . Diabetes Brother   . Hypertension Daughter   . Kidney cancer Neg Hx   . Bladder Cancer Neg Hx   . Colon cancer Neg Hx   . Colon polyps Neg Hx   . Rectal cancer Neg Hx    Social History   Tobacco Use  . Smoking status: Former Smoker    Packs/day: 0.25    Years: 61.00    Pack years: 15.25    Types: Cigarettes    Last attempt to quit: 03/25/2017    Years since quitting: 1.3  . Smokeless tobacco: Never Used  . Tobacco comment: smoking cessation materials not required  Substance Use Topics  . Alcohol use: No  . Drug use: No     Office Visit from 07/15/2018 in [facility]  AUDIT-C Score  0      Interim medical history since last visit reviewed. Allergies and medications reviewed  Review of Systems Per HPI  unless specifically indicated above     Objective:    BP 118/70   Pulse (!) 57   Temp 97.6 F (36.4 C)   Ht 5\' 8"  (1.727 m)   Wt 150 lb 8 oz (68.3 kg)   SpO2 94%   BMI 22.88 kg/m   Wt Readings from Last 3 Encounters:  07/15/18 150 lb 8 oz (68.3 kg)  06/28/18 151 lb 9.6 oz (68.8 kg)  06/13/18 150 lb 6.4 oz (68.2 kg)    Physical Exam Constitutional:      Appearance: She is well-developed.  Eyes:     General: No scleral icterus. Cardiovascular:     Rate and Rhythm: Regular rhythm. Bradycardia present.  No extrasystoles are present.    Comments: Borderline bradycardic; regular Pulmonary:     Effort: Pulmonary effort is normal.     Breath sounds: Normal breath sounds.  Skin:    Findings: No bruising.  Psychiatric:        Mood and Affect: Mood is not anxious or depressed.        Behavior: Behavior normal.     Results for orders placed or performed in visit on 07/15/18  POCT INR  Result Value Ref Range   INR 3.3 (A) 2.0 - 3.0      Assessment & Plan:   Problem List Items Addressed This Visit      Cardiovascular and Mediastinum   Atrial fibrillation (HCC)    Chronic anti-coagulation, goal INR 2-3; patient sounds to be in sinus today        Other   Anticoagulation goal of INR 2 to 3 - Primary    Goal INR 2-3; discussed the slightly elevated INR today; suggested changing Monday to 1 pill and recheck in 2 weeks; she wants instead to just stop the OTC medicine she has been taking for her sinuses and leave the coumadin the same; I also explained that the paxil may have caused the increase but she thinks it is the OTC med; she declined recommendation to return in 2 weeks to make sure back in 2-3 range; she wishes to come back in one month instead;  she'll notify me in the meantime of any bruising or bleeding      Relevant Orders   POCT INR (Completed)       Follow up plan: Return in about 1 month (around 08/13/2018) for follow-up visit with Dr. Sanda Klein, for INR.  An  after-visit summary was printed and given to the patient at [place].  Please see the patient instructions which may contain other information and recommendations beyond what is mentioned above in the assessment and plan.  No orders of the defined types were placed in this encounter.   Orders Placed This Encounter  Procedures  . POCT INR

## 2018-07-15 NOTE — Assessment & Plan Note (Addendum)
Chronic anti-coagulation, goal INR 2-3; patient sounds to be in sinus today

## 2018-07-15 NOTE — Assessment & Plan Note (Addendum)
Goal INR 2-3; discussed the slightly elevated INR today; suggested changing Monday to 1 pill and recheck in 2 weeks; she wants instead to just stop the OTC medicine she has been taking for her sinuses and leave the coumadin the same; I also explained that the paxil may have caused the increase but she thinks it is the OTC med; she declined recommendation to return in 2 weeks to make sure back in 2-3 range; she wishes to come back in one month instead; she'll notify me in the meantime of any bruising or bleeding

## 2018-07-15 NOTE — Patient Instructions (Signed)
Return in one month Contact me sooner if any bruising or bleeding Avoid tylenol

## 2018-07-19 ENCOUNTER — Telehealth: Payer: Self-pay | Admitting: Family Medicine

## 2018-07-19 DIAGNOSIS — I6523 Occlusion and stenosis of bilateral carotid arteries: Secondary | ICD-10-CM

## 2018-07-19 NOTE — Telephone Encounter (Signed)
I'm going through expired orders (ordered Apr 12, 2018, expired Jul 13, 2018) Please let her know that I'd really like her to have the carotid US I'll enter new carotid US Thank you

## 2018-07-22 NOTE — Telephone Encounter (Signed)
Notified, sent to referral coordinator [facility]

## 2018-07-29 ENCOUNTER — Encounter (INDEPENDENT_AMBULATORY_CARE_PROVIDER_SITE_OTHER): Payer: Self-pay

## 2018-07-29 ENCOUNTER — Other Ambulatory Visit: Payer: Self-pay

## 2018-07-29 ENCOUNTER — Ambulatory Visit
Admission: RE | Admit: 2018-07-29 | Discharge: 2018-07-29 | Disposition: A | Discharge status: Home / Self Care | Payer: Medicare Other | Source: Ambulatory Visit | Attending: Family Medicine | Admitting: Family Medicine

## 2018-07-29 DIAGNOSIS — G459 Transient cerebral ischemic attack, unspecified: Secondary | ICD-10-CM

## 2018-07-29 DIAGNOSIS — I6523 Occlusion and stenosis of bilateral carotid arteries: Secondary | ICD-10-CM | POA: Insufficient documentation

## 2018-07-29 DIAGNOSIS — Z5181 Encounter for therapeutic drug level monitoring: Secondary | ICD-10-CM

## 2018-07-29 DIAGNOSIS — Z7901 Long term (current) use of anticoagulants: Principal | ICD-10-CM

## 2018-07-30 MED ORDER — WARFARIN SODIUM 2 MG PO TABS: ORAL_TABLET | ORAL | 5 refills | Status: DC

## 2018-08-08 DIAGNOSIS — N2581 Secondary hyperparathyroidism of renal origin: Secondary | ICD-10-CM | POA: Diagnosis not present

## 2018-08-08 DIAGNOSIS — R3129 Other microscopic hematuria: Secondary | ICD-10-CM | POA: Diagnosis not present

## 2018-08-08 DIAGNOSIS — N183 Chronic kidney disease, stage 3 (moderate): Secondary | ICD-10-CM | POA: Diagnosis not present

## 2018-08-13 ENCOUNTER — Ambulatory Visit (INDEPENDENT_AMBULATORY_CARE_PROVIDER_SITE_OTHER): Payer: Medicare Other | Admitting: Family Medicine

## 2018-08-13 ENCOUNTER — Encounter: Payer: Self-pay | Admitting: Family Medicine

## 2018-08-13 VITALS — BP 124/82 | HR 89 | Temp 97.9°F | Resp 14 | Ht 68.0 in | Wt 148.2 lb

## 2018-08-13 DIAGNOSIS — R7303 Prediabetes: Secondary | ICD-10-CM | POA: Diagnosis not present

## 2018-08-13 DIAGNOSIS — Z5181 Encounter for therapeutic drug level monitoring: Secondary | ICD-10-CM

## 2018-08-13 DIAGNOSIS — G459 Transient cerebral ischemic attack, unspecified: Secondary | ICD-10-CM

## 2018-08-13 DIAGNOSIS — I129 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 stage 4 chronic kidney disease, or unspecified chronic kidney disease: Secondary | ICD-10-CM

## 2018-08-13 DIAGNOSIS — M8000XD Age-related osteoporosis with current pathological fracture, unspecified site, subsequent encounter for fracture with routine healing: Secondary | ICD-10-CM

## 2018-08-13 DIAGNOSIS — N183 Chronic kidney disease, stage 3 unspecified: Secondary | ICD-10-CM

## 2018-08-13 DIAGNOSIS — M810 Age-related osteoporosis without current pathological fracture: Secondary | ICD-10-CM | POA: Diagnosis not present

## 2018-08-13 DIAGNOSIS — I6523 Occlusion and stenosis of bilateral carotid arteries: Secondary | ICD-10-CM | POA: Diagnosis not present

## 2018-08-13 DIAGNOSIS — I10 Essential (primary) hypertension: Secondary | ICD-10-CM

## 2018-08-13 DIAGNOSIS — Z7901 Long term (current) use of anticoagulants: Secondary | ICD-10-CM

## 2018-08-13 DIAGNOSIS — E782 Mixed hyperlipidemia: Secondary | ICD-10-CM

## 2018-08-13 DIAGNOSIS — I480 Paroxysmal atrial fibrillation: Secondary | ICD-10-CM

## 2018-08-13 LAB — POCT INR: INR: 2.5 (ref 2.0–3.0)

## 2018-08-13 NOTE — Assessment & Plan Note (Signed)
controlled 

## 2018-08-13 NOTE — Assessment & Plan Note (Signed)
Check lipids; limit saturated fats; goal LDL less than 70

## 2018-08-13 NOTE — Assessment & Plan Note (Signed)
Check glucose and A1c (little bit of creamer in coffee only)

## 2018-08-13 NOTE — Assessment & Plan Note (Signed)
Intermittent, managed with coumadin

## 2018-08-13 NOTE — Assessment & Plan Note (Signed)
Last [place] reviewed

## 2018-08-13 NOTE — Progress Notes (Signed)
BP 124/82   Pulse 89   Temp 97.9 F (36.6 C) (Oral)   Resp 14   Ht 5\' 8"  (1.727 m)   Wt 148 lb 3.2 oz (67.2 kg)   SpO2 95%   BMI 22.53 kg/m    Subjective:    Patient ID: Christine Burnett, female    DOB: 1940/08/10, 78 y.o.   MRN: [id number]  HPI: Christine Burnett is a 78 y.o. female  Chief Complaint  Patient presents with  . Anticoagulation    INR: 2.5    HPI Patient is here for follow-up Coumadin; she takes 3 mg on Mondays and Thursdays, 2 mg all other days No abnormal bruising or bleeding Hx of 2-3 TIAs; most recent was maybe 25 years ago  HTN; well-controlled; not checking BP away from the doctor Not much salt in the diet  Carotid athero Last scan Jul 29, 2018 IMPRESSION: Minor carotid atherosclerosis. No hemodynamically significant ICA stenosis by ultrasound. Degree of narrowing less than 50% bilaterally.  Patent antegrade vertebral flow bilaterally   Electronically Signed   By: Jerilynn Mages.  Shick M.D.   On: 07/29/2018 12:33  Atrial fibrillation, intermittent, maybe once a month; worse if upset; no RVR  Hyperlipidemia; does enjoy chocolate; not eating fatty pig meat; mostly chicken, [place], fish; cheese daily  Prediabetes; last A1c was 5.7; occasional dry mouth, excessive thirst  CKD; seeing Dr. Holley Raring; saw him Feb 5th; he did labs; GFR was 34 before that  Depression screen [facility] 2/9 08/13/2018 07/15/2018 06/28/2018 06/13/2018 05/13/2018  Decreased Interest 0 0 0 2 0  Down, Depressed, Hopeless 0 0 0 3 0  PHQ - 2 Score 0 0 0 5 0  Altered sleeping 0 0 0 3 0  Tired, decreased energy 0 0 0 2 0  Change in appetite 0 0 0 2 0  Feeling bad or failure about yourself  0 0 0 2 0  Trouble concentrating 0 0 0 0 0  Moving slowly or fidgety/restless 0 0 0 0 0  Suicidal thoughts 0 0 0 2 0  PHQ-9 Score 0 0 0 16 0  Difficult doing work/chores Not difficult at all Not difficult at all Not difficult at all Somewhat difficult Not difficult at all  Some recent data might be  hidden  MD note: patient is not depressed  Fall Risk  08/13/2018 06/28/2018 06/13/2018 05/13/2018 04/12/2018  Falls in the past year? 0 0 0 0 0  Number falls in past yr: 0 - - - -  Injury with Fall? 0 - - - -  Comment - - - - -  Risk Factor Category  - - - - -  Risk for fall due to : - - - - -  Risk for fall due to: Comment - - - - -  Follow up - - - - -    Relevant past medical, surgical, family and social history reviewed Past Medical History:  Diagnosis Date  . A-fib ([place])   . Allergic rhinitis   . Anxiety   . Chronic bronchitis ([place])   . Chronic kidney disease, stage III (moderate) ([place]) 08/31/2017   Seeing nephrologist  . CKD (chronic kidney disease), stage III ([place])   . Cornea scar   . Depression    unspecified  . Diverticulosis   . Hyperlipidemia   . 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 Hypertension   . Osteoporosis   . Risk for falls   . Situational  disturbance   . TIA (transient ischemic attack)    Past Surgical History:  Procedure Laterality Date  . COLONOSCOPY  2016  . ESOPHAGUS SURGERY    . INTRAMEDULLARY (IM) NAIL INTERTROCHANTERIC Left 03/26/2017   Procedure: INTRAMEDULLARY (IM) NAIL INTERTROCHANTRIC;  Surgeon: Hessie Knows, MD;  Location: [facility] ORS;  Service: Orthopedics;  Laterality: Left;   Family History  Problem Relation Age of Onset  . Other Mother        Uremeic posioning  . Heart attack Father   . Prostate cancer Father   . Heart attack Brother   . Diabetes Brother   . Hypertension Daughter   . Kidney cancer Neg Hx   . Bladder Cancer Neg Hx   . Colon cancer Neg Hx   . Colon polyps Neg Hx   . Rectal cancer Neg Hx    Social History   Tobacco Use  . Smoking status: Former Smoker    Packs/day: 0.25    Years: 61.00    Pack years: 15.25    Types: Cigarettes    Last attempt to quit: 03/25/2017    Years since quitting: 1.3  . Smokeless tobacco: Never Used  . Tobacco comment: smoking cessation  materials not required  Substance Use Topics  . Alcohol use: No  . Drug use: No     [facility] Visit from 08/13/2018 in [facility]  AUDIT-C Score  0      Interim medical history since last visit reviewed. Allergies and medications reviewed  Review of Systems Per HPI unless specifically indicated above     Objective:    BP 124/82   Pulse 89   Temp 97.9 F (36.6 C) (Oral)   Resp 14   Ht 5\' 8"  (1.727 m)   Wt 148 lb 3.2 oz (67.2 kg)   SpO2 95%   BMI 22.53 kg/m   Wt Readings from Last 3 Encounters:  08/13/18 148 lb 3.2 oz (67.2 kg)  07/15/18 150 lb 8 oz (68.3 kg)  06/28/18 151 lb 9.6 oz (68.8 kg)    Physical Exam Constitutional:      General: She is not in acute distress.    Appearance: She is well-developed. She is not diaphoretic.  HENT:     Head: Normocephalic and atraumatic.  Eyes:     General: No scleral icterus. Neck:     Thyroid: No thyromegaly.     Vascular: No carotid bruit.  Cardiovascular:     Rate and Rhythm: Normal rate and regular rhythm.     Heart sounds: Normal heart sounds. No murmur.  Pulmonary:     Effort: Pulmonary effort is normal. No respiratory distress.     Breath sounds: Normal breath sounds. No wheezing.  Abdominal:     General: Bowel sounds are normal. There is no distension.     Palpations: Abdomen is soft.  Skin:    General: Skin is warm and dry.     Coloration: Skin is not pale.     Findings: No bruising or ecchymosis.  Neurological:     Mental Status: She is alert.  Psychiatric:        Behavior: Behavior normal.        Thought Content: Thought content normal.        Judgment: Judgment normal.       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 Holley Raring; avoiding NSAIDs; patient is a good water drinker      Relevant Orders   COMPLETE METABOLIC PANEL WITH GFR (Completed)     Other   Anticoagulation goal of INR 2 to 3 - Primary   Relevant Orders   POCT INR (Completed)   Prediabetes    Check glucose and A1c (little bit of creamer in coffee only)      Relevant Orders   Hemoglobin A1c (Completed)   Hyperlipidemia    Check lipids; limit saturated fats; goal LDL less than 70      Relevant Orders   Lipid panel (Completed)    Other Visit Diagnoses    Age-related osteoporosis without current pathological fracture       Relevant Orders   DG Bone Density   VITAMIN D 25 Hydroxy (Vit-D Deficiency, Fractures) (Completed)       Follow up plan: Return in about 1 month (around 09/13/2018) for follow-up visit with Dr. Sanda Klein / INR check.  An after-visit summary was printed and given to the patient at [place].  Please see the patient instructions which may contain other information and recommendations beyond what is mentioned above in the assessment and plan.  No orders of the defined types were placed in this encounter.   Orders Placed This Encounter  Procedures  . DG Bone Density  . COMPLETE METABOLIC PANEL WITH GFR  . Hemoglobin A1c  . Lipid panel  . TSH  . VITAMIN D 25 Hydroxy (Vit-D Deficiency, Fractures)  . POCT INR

## 2018-08-13 NOTE — Patient Instructions (Addendum)
Continue same regimen of coumadin and return in one month We'll get labs today Please do call to schedule your bone density study; the number to schedule one at either [facility] or [facility] is [phone] or [phone] If you have not heard anything from my staff in a week about any orders/referrals/studies from today, please contact us here to follow-up [phone]

## 2018-08-13 NOTE — Assessment & Plan Note (Signed)
Managed by Dr. Holley Raring; avoiding NSAIDs; patient is a good water drinker

## 2018-08-13 NOTE — Assessment & Plan Note (Signed)
Well-controlled; continue current regimen, try to limit sodium

## 2018-08-13 NOTE — Assessment & Plan Note (Signed)
Remote; on coumadin; no new neurologic symptoms at all

## 2018-08-14 ENCOUNTER — Other Ambulatory Visit: Payer: Self-pay | Admitting: Family Medicine

## 2018-08-14 DIAGNOSIS — Z5181 Encounter for therapeutic drug level monitoring: Secondary | ICD-10-CM

## 2018-08-14 DIAGNOSIS — E782 Mixed hyperlipidemia: Secondary | ICD-10-CM

## 2018-08-14 DIAGNOSIS — I6523 Occlusion and stenosis of bilateral carotid arteries: Secondary | ICD-10-CM

## 2018-08-14 LAB — COMPLETE METABOLIC PANEL WITH GFR
AG Ratio: 1.2 (calc) (ref 1.0–2.5)
ALBUMIN MSPROF: 4 g/dL (ref 3.6–5.1)
ALT: 9 U/L (ref 6–29)
AST: 16 U/L (ref 10–35)
Alkaline phosphatase (APISO): 88 U/L (ref 37–153)
BUN / CREAT RATIO: 12 (calc) (ref 6–22)
BUN: 19 mg/dL (ref 7–25)
CO2: 27 mmol/L (ref 20–32)
Calcium: 9.9 mg/dL (ref 8.6–10.4)
Chloride: 105 mmol/L (ref 98–110)
Creat: 1.56 mg/dL — ABNORMAL HIGH (ref 0.60–0.93)
GFR, Est African American: 37 mL/min/{1.73_m2} — ABNORMAL LOW (ref >=60)
GFR, Est Non African American: 31 mL/min/{1.73_m2} — ABNORMAL LOW (ref >=60)
Globulin: 3.3 g/dL (calc) (ref 1.9–3.7)
Glucose, Bld: 87 mg/dL (ref 65–99)
Potassium: 5.4 mmol/L — ABNORMAL HIGH (ref 3.5–5.3)
SODIUM: 139 mmol/L (ref 135–146)
Total Bilirubin: 0.5 mg/dL (ref 0.2–1.2)
Total Protein: 7.3 g/dL (ref 6.1–8.1)

## 2018-08-14 LAB — HEMOGLOBIN A1C
EAG (MMOL/L): 6.5 (calc)
Hgb A1c MFr Bld: 5.7 % of total Hgb — ABNORMAL HIGH (ref <5.7)
Mean Plasma Glucose: 117 (calc)

## 2018-08-14 LAB — LIPID PANEL
Cholesterol: 196 mg/dL (ref <200)
HDL: 50 mg/dL (ref >=50)
LDL Cholesterol (Calc): 120 mg/dL (calc) — ABNORMAL HIGH
Non-HDL Cholesterol (Calc): 146 mg/dL (calc) — ABNORMAL HIGH (ref <130)
Total CHOL/HDL Ratio: 3.9 (calc) (ref <5.0)
Triglycerides: 149 mg/dL (ref <150)

## 2018-08-14 LAB — VITAMIN D 25 HYDROXY (VIT D DEFICIENCY, FRACTURES): Vit D, 25-Hydroxy: 21 ng/mL — ABNORMAL LOW (ref 30–100)

## 2018-08-14 LAB — TSH: TSH: 2.53 mIU/L (ref 0.40–4.50)

## 2018-08-14 MED ORDER — ATORVASTATIN CALCIUM 10 MG PO TABS: 10.0000 mg | ORAL_TABLET | Freq: Every day | ORAL | 1 refills | Status: DC

## 2018-08-14 NOTE — Progress Notes (Signed)
Christine Burnett, please let the patient know that her kidney function has declined some since last time we have it recorded; we'll encourage her to talk to her kidney doctor about her numbers; her GFR was 31; her potassium went up; AVOID salt substitutes, fruit smoothies; talk to nephrologist about that too; her kidneys may not be clearing potassium like they should She has prediabetes; try to avoid sweets, sugary drinks, simple carbs like white bread and white rice Her cholesterol went up; her LDL rose 38 points; did she do something different? Did she stop a medicine or change her diet? Her risk of a heart attack in the next 10 years is over 20% (one in five chance); we want to get that LDL down; will she take a statin? Back to me with response and for Rx The 10-year ASCVD risk score [name] DC [name]., et al., 2013) is: 20.4%   Values used to calculate the score:     Age: 78 years     Sex: Female     Is Non-Hispanic African American: No     Diabetic: No     Tobacco smoker: No     Systolic Blood Pressure: 078 mmHg     Is BP treated: No     HDL Cholesterol: 50 mg/dL     Total Cholesterol: 196 mg/dL

## 2018-08-20 DIAGNOSIS — I10 Essential (primary) hypertension: Secondary | ICD-10-CM | POA: Diagnosis not present

## 2018-08-20 DIAGNOSIS — N183 Chronic kidney disease, stage 3 (moderate): Secondary | ICD-10-CM | POA: Diagnosis not present

## 2018-08-20 DIAGNOSIS — R319 Hematuria, unspecified: Secondary | ICD-10-CM | POA: Diagnosis not present

## 2018-09-13 ENCOUNTER — Ambulatory Visit: Payer: Medicare Other | Admitting: Family Medicine

## 2018-09-26 ENCOUNTER — Encounter: Payer: Self-pay | Admitting: Family Medicine

## 2018-09-26 ENCOUNTER — Encounter: Payer: Self-pay | Admitting: Nurse Practitioner

## 2018-10-12 ENCOUNTER — Other Ambulatory Visit: Payer: Self-pay | Admitting: Family Medicine

## 2018-10-21 ENCOUNTER — Ambulatory Visit
Admission: EM | Admit: 2018-10-21 | Discharge: 2018-10-21 | Disposition: A | Discharge status: Home / Self Care | Payer: Medicare Other | Attending: Family Medicine | Admitting: Family Medicine

## 2018-10-21 ENCOUNTER — Ambulatory Visit: Payer: Medicare Other

## 2018-10-21 ENCOUNTER — Encounter: Payer: Self-pay | Admitting: Emergency Medicine

## 2018-10-21 ENCOUNTER — Other Ambulatory Visit: Payer: Self-pay

## 2018-10-21 DIAGNOSIS — W19XXXA Unspecified fall, initial encounter: Secondary | ICD-10-CM | POA: Diagnosis not present

## 2018-10-21 DIAGNOSIS — S63502A Unspecified sprain of left wrist, initial encounter: Secondary | ICD-10-CM | POA: Insufficient documentation

## 2018-10-21 DIAGNOSIS — M25532 Pain in left wrist: Secondary | ICD-10-CM | POA: Diagnosis not present

## 2018-10-21 DIAGNOSIS — S6992XA Unspecified injury of left wrist, hand and finger(s), initial encounter: Secondary | ICD-10-CM | POA: Diagnosis not present

## 2018-10-21 NOTE — Discharge Instructions (Signed)
Rest, ice/heat, tylenol Follow up with orthopedist if no improvement over the next 1-2 weeks

## 2018-10-21 NOTE — ED Triage Notes (Signed)
Pt c/o left wrist pain. [place] the beach [place] and pain has not gotten better. Mild swelling. She states her wrist feels warm to the touch but she has full ROM.

## 2018-10-21 NOTE — ED Provider Notes (Signed)
[facility]    CSN: [id number] Arrival date & time: 10/21/18  1150     History   Chief Complaint Chief Complaint  Patient presents with  . Wrist Pain    left    HPI Christine Burnett is a 78 y.o. female.   78 yo female with a c/o left wrist pain after falling one week ago and landing on her wrist. States has had mild swelling.   The history is provided by the patient.    Past Medical History:  Diagnosis Date  . A-fib ([place])   . Allergic rhinitis   . Anxiety   . Chronic bronchitis ([place])   . Chronic kidney disease, stage III (moderate) ([place]) 08/31/2017   Seeing nephrologist  . CKD (chronic kidney disease), stage III ([place])   . Cornea scar   . Depression    unspecified  . Diverticulosis   . Hyperlipidemia   . 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 Hypertension   . Osteoporosis   . Risk for falls   . Situational disturbance   . TIA (transient ischemic attack)     Patient Active Problem List   Diagnosis Date Noted  . Prediabetes 08/13/2018  . Major depressive disorder, recurrent ([place]) 06/13/2018  . Hx of fracture of left hip 12/24/2017  . Abnormal finding on mammography 09/27/2017  . Allergic rhinitis 09/27/2017  . Altered bowel function 09/27/2017  . Blood in the urine 09/27/2017  . Cardiac conduction disorder 09/27/2017  . Carotid artery plaque 09/27/2017  . Compulsive tobacco user syndrome 09/27/2017  . Cornea scar 09/27/2017  . Cutaneous eruption 09/27/2017  . 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 Chronic kidney disease, stage III (moderate) ([place]) 08/31/2017  . 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 Atrial fibrillation ([place]) 08/09/2017  . 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 Hyperglycemia 11/10/2015  . Cardiac murmur 04/07/2015  . 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 stage 4 chronic kidney disease, or  unspecified chronic kidney disease 02/10/2015  . Long term current use of anticoagulant therapy 02/10/2015  . Anxiety 01/05/2015  . Hyperlipidemia 01/05/2015  . Anticoagulation goal of INR 2 to 3 11/16/2014  . HTN (hypertension) 09/24/2014  . TIA (transient ischemic attack) 09/24/2014    Past Surgical History:  Procedure Laterality Date  . COLONOSCOPY  2016  . ESOPHAGUS SURGERY    . INTRAMEDULLARY (IM) NAIL INTERTROCHANTERIC Left 03/26/2017   Procedure: INTRAMEDULLARY (IM) NAIL INTERTROCHANTRIC;  Surgeon: Hessie Knows, MD;  Location: [facility] ORS;  Service: Orthopedics;  Laterality: Left;    OB History   No obstetric history on file.      Home Medications    Prior to Admission medications   Medication Sig Start Date End Date Taking? Authorizing Provider  acetaminophen (TYLENOL) 325 MG tablet Take 1 tablet (325 mg total) by mouth at bedtime as needed. 08/09/17  Yes Lada, Satira Anis, MD  albuterol (PROVENTIL HFA;VENTOLIN HFA) 108 (90 Base) MCG/ACT inhaler Inhale 2 puffs into the lungs every 6 (six) hours as needed for wheezing or shortness of breath. 08/01/16  Yes Rochel Brome A, MD  atorvastatin (LIPITOR) 10 MG tablet TAKE 1 TABLET BY MOUTH AT BEDTIME 10/13/18  Yes Poulose, Bethel Born, NP  Cholecalciferol (VITAMIN D-3) 1000 units CAPS Take 2 capsules by mouth daily.  08/09/17  Yes Lada, Satira Anis, MD  Cyanocobalamin (VITAMIN B-12) 500 MCG SUBL One tablet dissolved under the tongue once a  day 05/13/18  Yes Lada, Satira Anis, MD  ezetimibe (ZETIA) 10 MG tablet Take 1 tablet (10 mg total) by mouth daily. 05/13/18  Yes Lada, Satira Anis, MD  PARoxetine (PAXIL) 40 MG tablet Take 1 tablet (40 mg total) by mouth every morning. 06/13/18  Yes Arnetha Courser, MD  warfarin (COUMADIN) 2 MG tablet Take 3 mg on Mondays and Thursdays, and 2 mg daily all other days OR as directed by your doctor most recently 07/30/18  Yes Lada, Satira Anis, MD  Homeopathic Products (ZINC COLD THERAPY PO) Take 1 tablet by mouth  daily as needed.    [provider]    Family History Family History  Problem Relation Age of Onset  . Other Mother        [place] posioning  . Heart attack Father   . Prostate cancer Father   . Heart attack Brother   . Diabetes Brother   . Hypertension Daughter   . Kidney cancer Neg Hx   . Bladder Cancer Neg Hx   . Colon cancer Neg Hx   . Colon polyps Neg Hx   . Rectal cancer Neg Hx     Social History Social History   Tobacco Use  . Smoking status: Former Smoker    Packs/day: 0.25    Years: 61.00    Pack years: 15.25    Types: Cigarettes    Last attempt to quit: 03/25/2017    Years since quitting: 1.5  . Smokeless tobacco: Never Used  . Tobacco comment: smoking cessation materials not required  Substance Use Topics  . Alcohol use: No  . Drug use: No     Allergies   Cefuroxime axetil; Ciprofloxacin; and Codeine   Review of Systems Review of Systems   Physical Exam Triage Vital Signs ED Triage Vitals  Enc Vitals Group     BP 10/21/18 1210 130/89     Pulse Rate 10/21/18 1210 76     Resp 10/21/18 1210 18     Temp 10/21/18 1210 97.8 F (36.6 C)     Temp Source 10/21/18 1210 Oral     SpO2 10/21/18 1210 100 %     Weight 10/21/18 1206 150 lb (68 kg)     Height 10/21/18 1206 5\' 7"  (1.702 m)     Head Circumference --      Peak Flow --      Pain Score 10/21/18 1206 3     Pain Loc --      Pain Edu? --      Excl. in [place]? --    No data found.  Updated Vital Signs BP 130/89 (BP Location: Left Arm)   Pulse 76   Temp 97.8 F (36.6 C) (Oral)   Resp 18   Ht 5\' 7"  (1.702 m)   Wt 68 kg   SpO2 100%   BMI 23.49 kg/m   Visual Acuity Right Eye Distance:   Left Eye Distance:   Bilateral Distance:    Right Eye Near:   Left Eye Near:    Bilateral Near:     Physical Exam Vitals signs and nursing note reviewed.  Constitutional:      General: She is not in acute distress.    Appearance: She is not toxic-appearing or diaphoretic.   Musculoskeletal:     Left wrist: She exhibits tenderness and bony tenderness. She exhibits normal range of motion, no swelling, no effusion, no crepitus, no deformity and no laceration.  Neurological:     Mental  Status: She is alert.      UC Treatments / Results  Labs (all labs ordered are listed, but only abnormal results are displayed) Labs Reviewed - No data to display  EKG None  Radiology Dg Wrist Complete Left  Result Date: 10/21/2018 CLINICAL DATA:  78 year old female with a history of fall and pain EXAM: LEFT WRIST - COMPLETE 3+ VIEW COMPARISON:  None. FINDINGS: No acute displaced fracture. Widening of the scapholunate interval. The carpal arcs/alignment maintained. No focal soft tissue swelling. No radiopaque foreign body. IMPRESSION: Negative for acute bony abnormality. Widening of the scapholunate interval, may indicate ligament injury. Electronically Signed   By: Corrie Mckusick D.O.   On: 10/21/2018 12:51    Procedures Procedures (including critical care time)  Medications Ordered in UC Medications - No data to display  Initial Impression / Assessment and Plan / UC Course  I have reviewed the triage vital signs and the nursing notes.  Pertinent labs & imaging results that were available during my care of the patient were reviewed by me and considered in my medical decision making (see chart for details).      Final Clinical Impressions(s) / UC Diagnoses   Final diagnoses:  Sprain of left wrist, initial encounter     Discharge Instructions     Rest, ice/heat, tylenol Follow up with orthopedist if no improvement over the next 1-2 weeks    ED Prescriptions    None      1. x-ray result (negative) and diagnosis reviewed with patient 2. Recommend supportive treatment as above 3. Follow-up prn if symptoms worsen or don't improve   Controlled Substance Prescriptions [place] Controlled Substance Registry consulted? Not Applicable   Norval Gable, MD  10/21/18 [phone]

## 2018-11-09 ENCOUNTER — Other Ambulatory Visit: Payer: Self-pay | Admitting: Nurse Practitioner

## 2018-11-11 NOTE — Telephone Encounter (Signed)
lvm informing pt that script has been sent to the pharmacy. Pt also need to schedule appt

## 2018-11-11 NOTE — Telephone Encounter (Signed)
Patient due for appointment

## 2018-11-12 ENCOUNTER — Encounter: Payer: Self-pay | Admitting: Nurse Practitioner

## 2018-11-12 ENCOUNTER — Other Ambulatory Visit: Payer: Self-pay

## 2018-11-12 ENCOUNTER — Ambulatory Visit (INDEPENDENT_AMBULATORY_CARE_PROVIDER_SITE_OTHER): Payer: Medicare Other | Admitting: Nurse Practitioner

## 2018-11-12 VITALS — BP 108/64 | HR 95 | Temp 97.6°F | Resp 14 | Ht 67.5 in | Wt 150.5 lb

## 2018-11-12 DIAGNOSIS — J40 Bronchitis, not specified as acute or chronic: Secondary | ICD-10-CM

## 2018-11-12 DIAGNOSIS — Z7901 Long term (current) use of anticoagulants: Secondary | ICD-10-CM | POA: Diagnosis not present

## 2018-11-12 DIAGNOSIS — D692 Other nonthrombocytopenic purpura: Secondary | ICD-10-CM

## 2018-11-12 DIAGNOSIS — Z5181 Encounter for therapeutic drug level monitoring: Secondary | ICD-10-CM | POA: Diagnosis not present

## 2018-11-12 DIAGNOSIS — F330 Major depressive disorder, recurrent, mild: Secondary | ICD-10-CM

## 2018-11-12 DIAGNOSIS — E782 Mixed hyperlipidemia: Secondary | ICD-10-CM

## 2018-11-12 DIAGNOSIS — I480 Paroxysmal atrial fibrillation: Secondary | ICD-10-CM | POA: Diagnosis not present

## 2018-11-12 LAB — POCT INR: INR: 2.3 (ref 2.0–3.0)

## 2018-11-12 MED ORDER — ALBUTEROL SULFATE HFA 108 (90 BASE) MCG/ACT IN AERS: 2.0000 | INHALATION_SPRAY | Freq: Four times a day (QID) | RESPIRATORY_TRACT | 2 refills | Status: DC | PRN

## 2018-11-12 MED ORDER — ATORVASTATIN CALCIUM 10 MG PO TABS: 10.0000 mg | ORAL_TABLET | Freq: Every day | ORAL | 1 refills | Status: DC

## 2018-11-12 NOTE — Patient Instructions (Signed)

## 2018-11-12 NOTE — Progress Notes (Signed)
Name: Christine Burnett   MRN: [id number]    DOB: Jun 08, 1940   Date:11/12/2018       Progress Note  Subjective  Chief Complaint  Chief Complaint  Patient presents with  . Medication Refill    HPI  Paroxysmal atrial fibrillation  Patient is taking coumadin for over 30 years with INR goal goal 2-3. She is taking 3 mg 2 days a week and 2 mg the other 5 days. Has been on this stable dosage for the past 6 months or so.  Denies excessive bleeding or bruising, dark tarry stools, blood in urine or gums.   Mild episode of recurrent major depressive disorder  Patient is taking paxil 40 mg states being in the house with quarantine has increased depression. Does not talk to counselor.   Mixed hyperlipidemia Atorvastatin 10mg  daily and zetia 10 mg daily. No missed doses or issues with this medication.  Denies myalgias, chest pain Lab Results  Component Value Date   CHOL 196 08/13/2018   HDL 50 08/13/2018   LDLCALC 120 (H) 08/13/2018   TRIG 149 08/13/2018   CHOLHDL 3.9 08/13/2018   Chronic Bronchitis States takes it very rarely if needed for wheezing.  Quit smoking a year and half ago.  - albuterol (VENTOLIN HFA) 108 (90 Base) MCG/ACT inhaler; Inhale 2 puffs into the lungs every 6 (six) hours as needed for wheezing or shortness of breath.  Dispense: 1 Inhaler; Refill: 2   PHQ2/9: Depression screen [facility] 2/9 11/12/2018 08/13/2018 07/15/2018 06/28/2018 06/13/2018  Decreased Interest 0 0 0 0 2  Down, Depressed, Hopeless 0 0 0 0 3  PHQ - 2 Score 0 0 0 0 5  Altered sleeping 0 0 0 0 3  Tired, decreased energy 0 0 0 0 2  Change in appetite 0 0 0 0 2  Feeling bad or failure about yourself  0 0 0 0 2  Trouble concentrating 0 0 0 0 0  Moving slowly or fidgety/restless 0 0 0 0 0  Suicidal thoughts 0 0 0 0 2  PHQ-9 Score 0 0 0 0 16  Difficult doing work/chores Not difficult at all Not difficult at all Not difficult at all Not difficult at all Somewhat difficult  Some recent data might be hidden      PHQ reviewed. Negative  Patient Active Problem List   Diagnosis Date Noted  . Prediabetes 08/13/2018  . Major depressive disorder, recurrent ([place]) 06/13/2018  . Hx of fracture of left hip 12/24/2017  . Abnormal finding on mammography 09/27/2017  . Allergic rhinitis 09/27/2017  . Altered bowel function 09/27/2017  . Blood in the urine 09/27/2017  . Cardiac conduction disorder 09/27/2017  . Carotid artery plaque 09/27/2017  . Compulsive tobacco user syndrome 09/27/2017  . Cornea scar 09/27/2017  . Cutaneous eruption 09/27/2017  . 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 Chronic kidney disease, stage III (moderate) ([place]) 08/31/2017  . 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 Atrial fibrillation ([place]) 08/09/2017  . 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 Hyperglycemia 11/10/2015  . Cardiac murmur 04/07/2015  . 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 Long term current use of anticoagulant therapy 02/10/2015  . Anxiety 01/05/2015  . Hyperlipidemia 01/05/2015  . Anticoagulation goal of INR 2 to 3 11/16/2014  . HTN (hypertension) 09/24/2014  . TIA (transient ischemic attack) 09/24/2014    Past Medical History:  Diagnosis Date  . A-fib ([place])   . Allergic rhinitis   . Anxiety   . Chronic bronchitis ([place])   . Chronic kidney disease, stage III (moderate) ([place]) 08/31/2017   Seeing nephrologist  . CKD (chronic kidney disease), stage III ([place])   . Cornea scar   . Depression    unspecified  . Diverticulosis   . Hyperlipidemia   . 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 Hypertension   . Osteoporosis   . Risk for falls   . Situational disturbance   . TIA (transient ischemic attack)     Past Surgical History:  Procedure Laterality Date  . COLONOSCOPY  2016  .  ESOPHAGUS SURGERY    . INTRAMEDULLARY (IM) NAIL INTERTROCHANTERIC Left 03/26/2017   Procedure: INTRAMEDULLARY (IM) NAIL INTERTROCHANTRIC;  Surgeon: Hessie Knows, MD;  Location: [facility] ORS;  Service: Orthopedics;  Laterality: Left;    Social History   Tobacco Use  . Smoking status: Former Smoker    Packs/day: 0.25    Years: 61.00    Pack years: 15.25    Types: Cigarettes    Last attempt to quit: 03/25/2017    Years since quitting: 1.6  . Smokeless tobacco: Never Used  . Tobacco comment: smoking cessation materials not required  Substance Use Topics  . Alcohol use: No     Current Outpatient Medications:  .  acetaminophen (TYLENOL) 325 MG tablet, Take 1 tablet (325 mg total) by mouth at bedtime as needed., Disp: , Rfl:  .  albuterol (VENTOLIN HFA) 108 (90 Base) MCG/ACT inhaler, Inhale 2 puffs into the lungs every 6 (six) hours as needed for wheezing or shortness of breath., Disp: 1 Inhaler, Rfl: 2 .  atorvastatin (LIPITOR) 10 MG tablet, TAKE 1 TABLET BY MOUTH AT BEDTIME, Disp: 30 tablet, Rfl: 0 .  Cholecalciferol (VITAMIN D-3) 1000 units CAPS, Take 2 capsules by mouth daily. , Disp: , Rfl:  .  Cyanocobalamin (VITAMIN B-12) 500 MCG SUBL, One tablet dissolved under the tongue once a day, Disp: 100 tablet, Rfl: 3 .  ezetimibe (ZETIA) 10 MG tablet, Take 1 tablet (10 mg total) by mouth daily., Disp: 30 tablet, Rfl: 11 .  PARoxetine (PAXIL) 40 MG tablet, Take 1 tablet (40 mg total) by mouth every morning., Disp: 90 tablet, Rfl: 1 .  warfarin (COUMADIN) 2 MG tablet, Take 3 mg on Mondays and Thursdays, and 2 mg daily all other days OR as directed by your doctor most recently, Disp: 36 tablet, Rfl: 5 .  Homeopathic Products (ZINC COLD THERAPY PO), Take 1 tablet by mouth daily as needed., Disp: , Rfl:   Allergies  Allergen Reactions  . Cefuroxime Axetil     caused rash.  . Ciprofloxacin     Unknown  . Codeine     ROS    No other specific complaints in a complete review of systems  (except as listed in HPI above).  Objective  Vitals:   11/12/18 1544  BP: 108/64  Pulse: 95  Resp: 14  Temp: 97.6 F (36.4 C)  TempSrc: Oral  SpO2: 96%  Weight: 150 lb 8 oz (68.3 kg)  Height: 5' 7.5" (1.715 m)     Body mass index is 23.22 kg/m.  Nursing Note and Vital Signs reviewed.  Physical Exam  Constitutional: Patient appears well-developed and well-nourished.  No distress.  HEENT: head atraumatic, normocephalic, pupils neck supple, conjunctiva clear Cardiovascular: Normal rate, regular rhythm and normal heart sounds.  No murmur heard. No  BLE edema. Pulmonary/Chest: Effort normal and breath sounds normal. No respiratory distress. Abdominal: Soft.  There is no tenderness. Skin: mild bruising on bilateral arms, no concerning rashes.  Psychiatric: Patient has a normal mood and affect. behavior is normal. Judgment and thought content normal.    No results found for this or any previous visit (from the past 48 hour(s)).  Assessment & Plan  1. Paroxysmal atrial fibrillation (HCC) Continue current coumadin dosing  2. Mild episode of recurrent major depressive disorder (HCC) Stable notes increase  3. Long term current use of anticoagulant therapy At goal  - POCT INR  4. Anticoagulation goal of INR 2 to 3 - POCT INR  5. Mixed hyperlipidemia - atorvastatin (LIPITOR) 10 MG tablet; Take 1 tablet (10 mg total) by mouth at bedtime.  Dispense: 90 tablet; Refill: 1 - Lipid Profile  6. Bronchitis stable - albuterol (VENTOLIN HFA) 108 (90 Base) MCG/ACT inhaler; Inhale 2 puffs into the lungs every 6 (six) hours as needed for wheezing or shortness of breath.  Dispense: 1 Inhaler; Refill: 2  7. 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

## 2018-11-13 LAB — HEPATIC FUNCTION PANEL
AG Ratio: 1.3 (calc) (ref 1.0–2.5)
ALT: 11 U/L (ref 6–29)
AST: 18 U/L (ref 10–35)
Albumin: 3.7 g/dL (ref 3.6–5.1)
Alkaline phosphatase (APISO): 89 U/L (ref 37–153)
Bilirubin, Direct: 0.1 mg/dL (ref 0.0–0.2)
Globulin: 2.8 g/dL (calc) (ref 1.9–3.7)
Indirect Bilirubin: 0.3 mg/dL (calc) (ref 0.2–1.2)
Total Bilirubin: 0.4 mg/dL (ref 0.2–1.2)
Total Protein: 6.5 g/dL (ref 6.1–8.1)

## 2018-11-13 LAB — LIPID PANEL
Cholesterol: 115 mg/dL (ref <200)
HDL: 37 mg/dL — ABNORMAL LOW (ref >=50)
LDL Cholesterol (Calc): 53 mg/dL (calc)
Non-HDL Cholesterol (Calc): 78 mg/dL (calc) (ref <130)
Total CHOL/HDL Ratio: 3.1 (calc) (ref <5.0)
Triglycerides: 171 mg/dL — ABNORMAL HIGH (ref <150)

## 2018-12-10 ENCOUNTER — Ambulatory Visit (INDEPENDENT_AMBULATORY_CARE_PROVIDER_SITE_OTHER): Payer: Medicare Other | Admitting: Nurse Practitioner

## 2018-12-10 ENCOUNTER — Encounter: Payer: Self-pay | Admitting: Nurse Practitioner

## 2018-12-10 ENCOUNTER — Other Ambulatory Visit: Payer: Self-pay

## 2018-12-10 VITALS — BP 118/62 | HR 97 | Temp 97.7°F | Resp 12 | Ht 62.0 in | Wt 153.0 lb

## 2018-12-10 DIAGNOSIS — I480 Paroxysmal atrial fibrillation: Secondary | ICD-10-CM | POA: Diagnosis not present

## 2018-12-10 DIAGNOSIS — F330 Major depressive disorder, recurrent, mild: Secondary | ICD-10-CM

## 2018-12-10 DIAGNOSIS — N2581 Secondary hyperparathyroidism of renal origin: Secondary | ICD-10-CM | POA: Diagnosis not present

## 2018-12-10 DIAGNOSIS — G459 Transient cerebral ischemic attack, unspecified: Secondary | ICD-10-CM | POA: Diagnosis not present

## 2018-12-10 DIAGNOSIS — Z5181 Encounter for therapeutic drug level monitoring: Secondary | ICD-10-CM

## 2018-12-10 DIAGNOSIS — F410 Panic disorder [episodic paroxysmal anxiety] without agoraphobia: Secondary | ICD-10-CM | POA: Diagnosis not present

## 2018-12-10 DIAGNOSIS — F419 Anxiety disorder, unspecified: Secondary | ICD-10-CM

## 2018-12-10 DIAGNOSIS — Z7901 Long term (current) use of anticoagulants: Secondary | ICD-10-CM | POA: Diagnosis not present

## 2018-12-10 DIAGNOSIS — I5032 Chronic diastolic (congestive) heart failure: Secondary | ICD-10-CM | POA: Insufficient documentation

## 2018-12-10 LAB — POCT INR: INR: 2.4 (ref 2.0–3.0)

## 2018-12-10 MED ORDER — PAROXETINE HCL 40 MG PO TABS: 40.0000 mg | ORAL_TABLET | ORAL | 1 refills | Status: DC

## 2018-12-10 MED ORDER — HYDROXYZINE HCL 10 MG PO TABS: 10.0000 mg | ORAL_TABLET | Freq: Two times a day (BID) | ORAL | 1 refills | Status: DC | PRN

## 2018-12-10 NOTE — Progress Notes (Signed)
Name: Christine Burnett   MRN: [id number]    DOB: 12-Oct-1940   Date:12/10/2018       Progress Note  Subjective  Chief Complaint  Chief Complaint  Patient presents with  . Follow-up  . Coagulation Disorder  . Medication Refill    HPI  Paroxysmal atrial fibrillation  Patient is taking coumadin for over 30 years with INR goal goal 2-3. She is taking 3 mg 2 days a week and 2 mg the other 5 days. Has been on this stable dosage for the past 6 months or so.  Denies excessive bleeding or bruising, dark tarry stools, blood in urine or gums.   PTH Follows up with nephrology- Dr. Holley Raring- has follow up this week.  monitoring levels, does not recommend Calcitrol or phosphorus binders yet.    MDD and anxiety  Patient takes paxil daily has been on this for years.  Patient states has 1-2 panic attacks in the last month- went to room and went to bed. States it is related her daughters are living with her and have been more stressed with each other and its hard for her.    PHQ2/9: Depression screen [facility] 2/9 11/12/2018 08/13/2018 07/15/2018 06/28/2018 06/13/2018  Decreased Interest 0 0 0 0 2  Down, Depressed, Hopeless 0 0 0 0 3  PHQ - 2 Score 0 0 0 0 5  Altered sleeping 0 0 0 0 3  Tired, decreased energy 0 0 0 0 2  Change in appetite 0 0 0 0 2  Feeling bad or failure about yourself  0 0 0 0 2  Trouble concentrating 0 0 0 0 0  Moving slowly or fidgety/restless 0 0 0 0 0  Suicidal thoughts 0 0 0 0 2  PHQ-9 Score 0 0 0 0 16  Difficult doing work/chores Not difficult at all Not difficult at all Not difficult at all Not difficult at all Somewhat difficult  Some recent data might be hidden   PHQ reviewed. Negative  Patient Active Problem List   Diagnosis Date Noted  . Prediabetes 08/13/2018  . Major depressive disorder, recurrent ([place]) 06/13/2018  . Hx of fracture of left hip 12/24/2017  . Abnormal finding on mammography 09/27/2017  . Allergic rhinitis 09/27/2017  . Altered bowel function  09/27/2017  . Blood in the urine 09/27/2017  . Cardiac conduction disorder 09/27/2017  . Carotid artery plaque 09/27/2017  . Compulsive tobacco user syndrome 09/27/2017  . Cornea scar 09/27/2017  . Cutaneous eruption 09/27/2017  . 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 Chronic kidney disease, stage III (moderate) ([place]) 08/31/2017  . Hyperparathyroidism, secondary renal ([place]) 08/31/2017  . Atrial fibrillation ([place]) 08/09/2017  . 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 Hyperglycemia 11/10/2015  . Cardiac murmur 04/07/2015  . 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 Long term current use of anticoagulant therapy 02/10/2015  . Anxiety 01/05/2015  . Hyperlipidemia 01/05/2015  . Anticoagulation goal of INR 2 to 3 11/16/2014  . HTN (hypertension) 09/24/2014  . TIA (transient ischemic attack) 09/24/2014    Past Medical History:  Diagnosis Date  . A-fib ([place])   . Allergic rhinitis   . Anxiety   . Chronic bronchitis ([place])   . Chronic kidney disease, stage III (moderate) ([place]) 08/31/2017   Seeing nephrologist  . CKD (chronic kidney disease), stage III ([place])   . Cornea scar   . Depression  unspecified  . Diverticulosis   . Hyperlipidemia   . Hyperparathyroidism, secondary renal ([place]) 08/31/2017   Managed by nephrologist  . Hypertension   . Osteoporosis   . Risk for falls   . Situational disturbance   . TIA (transient ischemic attack)     Past Surgical History:  Procedure Laterality Date  . COLONOSCOPY  2016  . ESOPHAGUS SURGERY    . INTRAMEDULLARY (IM) NAIL INTERTROCHANTERIC Left 03/26/2017   Procedure: INTRAMEDULLARY (IM) NAIL INTERTROCHANTRIC;  Surgeon: Hessie Knows, MD;  Location: [facility] ORS;  Service: Orthopedics;  Laterality: Left;    Social History   Tobacco Use  . Smoking status: Former Smoker    Packs/day: 0.25     Years: 61.00    Pack years: 15.25    Types: Cigarettes    Quit date: 03/25/2017    Years since quitting: 1.7  . Smokeless tobacco: Never Used  . Tobacco comment: smoking cessation materials not required  Substance Use Topics  . Alcohol use: No     Current Outpatient Medications:  .  acetaminophen (TYLENOL) 325 MG tablet, Take 1 tablet (325 mg total) by mouth at bedtime as needed., Disp: , Rfl:  .  albuterol (VENTOLIN HFA) 108 (90 Base) MCG/ACT inhaler, Inhale 2 puffs into the lungs every 6 (six) hours as needed for wheezing or shortness of breath., Disp: 1 Inhaler, Rfl: 2 .  atorvastatin (LIPITOR) 10 MG tablet, Take 1 tablet (10 mg total) by mouth at bedtime., Disp: 90 tablet, Rfl: 1 .  Cholecalciferol (VITAMIN D-3) 1000 units CAPS, Take 2 capsules by mouth daily. , Disp: , Rfl:  .  Cyanocobalamin (VITAMIN B-12) 500 MCG SUBL, One tablet dissolved under the tongue once a day, Disp: 100 tablet, Rfl: 3 .  ezetimibe (ZETIA) 10 MG tablet, Take 1 tablet (10 mg total) by mouth daily., Disp: 30 tablet, Rfl: 11 .  Homeopathic Products (ZINC COLD THERAPY PO), Take 1 tablet by mouth daily as needed., Disp: , Rfl:  .  PARoxetine (PAXIL) 40 MG tablet, Take 1 tablet (40 mg total) by mouth every morning., Disp: 90 tablet, Rfl: 1 .  warfarin (COUMADIN) 2 MG tablet, Take 3 mg on Mondays and Thursdays, and 2 mg daily all other days OR as directed by your doctor most recently, Disp: 36 tablet, Rfl: 5  Allergies  Allergen Reactions  . Cefuroxime Axetil     caused rash.  . Ciprofloxacin     Unknown  . Codeine     ROS    No other specific complaints in a complete review of systems (except as listed in HPI above).  Objective  Vitals:   12/10/18 1403  BP: 118/62  Pulse: 97  Resp: 12  Temp: 97.7 F (36.5 C)  TempSrc: Oral  SpO2: 98%  Weight: 153 lb (69.4 kg)  Height: 5\' 2"  (1.575 m)     Body mass index is 27.98 kg/m.  Nursing Note and Vital Signs reviewed.  Physical Exam    Physical Exam   Constitutional: Patient appears well-developed and well-nourished. Obese. No distress.  HEENT: head atraumatic, normocephalic, conjunctive clear Cardiovascular: Normal rate,  No BLE edema. Pulmonary/Chest: Effort normall. No respiratory distress. Skin: no concerning rashes or bruising  Psychiatric: Patient has a normal mood and affect. behavior is normal. Judgment and thought content normal.   Hands off exam due to pandemic and no acute concerns.     No results found for this or any previous visit (from the past 48 hour(s)).  Assessment & Plan  1. Paroxysmal atrial fibrillation (HCC) Stable   2. TIA (transient ischemic attack) Takes anticoagulation for prevention   3. Anticoagulation goal of INR 2 to 3 Goal  - POCT INR  4. Hyperparathyroidism, secondary renal ([place]) Follow up with nephrology   5. Long term current use of anticoagulant therapy Stable continue   6. Mild episode of recurrent major depressive disorder (HCC) - PARoxetine (PAXIL) 40 MG tablet; Take 1 tablet (40 mg total) by mouth every morning.  Dispense: 90 tablet; Refill: 1  7. Panic - hydrOXYzine (ATARAX/VISTARIL) 10 MG tablet; Take 1-2 tablets (10-20 mg total) by mouth every 12 (twelve) hours as needed for anxiety.  Dispense: 30 tablet; Refill: 1  8. Anxiety Discussed deep breathing and stress reduction techniques

## 2018-12-12 DIAGNOSIS — N182 Chronic kidney disease, stage 2 (mild): Secondary | ICD-10-CM | POA: Diagnosis not present

## 2018-12-12 DIAGNOSIS — N2581 Secondary hyperparathyroidism of renal origin: Secondary | ICD-10-CM | POA: Diagnosis not present

## 2018-12-19 ENCOUNTER — Encounter: Payer: Self-pay | Admitting: Family Medicine

## 2019-01-09 ENCOUNTER — Encounter: Payer: Self-pay | Admitting: Podiatry

## 2019-01-09 ENCOUNTER — Ambulatory Visit (INDEPENDENT_AMBULATORY_CARE_PROVIDER_SITE_OTHER): Payer: Medicare Other | Admitting: Podiatry

## 2019-01-09 ENCOUNTER — Other Ambulatory Visit: Payer: Self-pay

## 2019-01-09 VITALS — Temp 98.4°F

## 2019-01-09 DIAGNOSIS — B351 Tinea unguium: Secondary | ICD-10-CM | POA: Diagnosis not present

## 2019-01-09 DIAGNOSIS — M216X9 Other acquired deformities of unspecified foot: Secondary | ICD-10-CM

## 2019-01-09 DIAGNOSIS — M79675 Pain in left toe(s): Secondary | ICD-10-CM

## 2019-01-09 DIAGNOSIS — M79674 Pain in right toe(s): Secondary | ICD-10-CM

## 2019-01-09 DIAGNOSIS — L84 Corns and callosities: Secondary | ICD-10-CM | POA: Diagnosis not present

## 2019-01-09 NOTE — Progress Notes (Signed)
This patient presents to the office with chief complaint of painful calluses on both feet as well as painful long thick nails.  Patient states the nails in her big toes are painful and growing into the nail borders.  She also says she has multiple calluses on both feet which make it difficult to walk.  She says she has had multiple surgeries performed on her feet but the deformities seem to be returning.    Patient is also taking Coumadin.  She presents the office today for an evaluation and treatment of her painful feet  General Appearance  Alert, conversant and in no acute stress.  Vascular  Dorsalis pedis and posterior tibial  pulses are palpable  bilaterally.  Capillary return is within normal limits  bilaterally. Temperature is within normal limits  bilaterally.  Neurologic  Senn-Weinstein monofilament wire test within normal limits  bilaterally. Muscle power within normal limits bilaterally.  Nails Thick disfigured discolored nails with subungual debris  from hallux to fifth toes bilaterally. No evidence of bacterial infection or drainage bilaterally.  Orthopedic  No limitations of motion  feet .  No crepitus or effusions noted.  HAV  B/L with hammer toes  B/L. Exostosis 1st MCJ  B/L  Skin  normotropic skin with no porokeratosis noted bilaterally.  No signs of infections or ulcers noted.  Callus sub 1,5  B/L.  Callus hallux  B/L Porokeratosis sub 3 left foot.  Onychomycosis  B/L  Callus  B/L  Debride nails  X 2.  Debride multiple callus  B/L.  RTC 3 months.   Gardiner Barefoot DPM

## 2019-01-21 ENCOUNTER — Ambulatory Visit (INDEPENDENT_AMBULATORY_CARE_PROVIDER_SITE_OTHER): Payer: Medicare Other | Admitting: Nurse Practitioner

## 2019-01-21 ENCOUNTER — Other Ambulatory Visit: Payer: Self-pay

## 2019-01-21 ENCOUNTER — Encounter: Payer: Self-pay | Admitting: Nurse Practitioner

## 2019-01-21 VITALS — BP 116/74 | HR 97 | Temp 97.5°F | Resp 16 | Ht 62.0 in | Wt 155.6 lb

## 2019-01-21 DIAGNOSIS — Z5181 Encounter for therapeutic drug level monitoring: Secondary | ICD-10-CM

## 2019-01-21 DIAGNOSIS — I480 Paroxysmal atrial fibrillation: Secondary | ICD-10-CM | POA: Diagnosis not present

## 2019-01-21 DIAGNOSIS — N183 Chronic kidney disease, stage 3 unspecified: Secondary | ICD-10-CM

## 2019-01-21 DIAGNOSIS — R2689 Other abnormalities of gait and mobility: Secondary | ICD-10-CM

## 2019-01-21 DIAGNOSIS — Z7901 Long term (current) use of anticoagulants: Secondary | ICD-10-CM | POA: Diagnosis not present

## 2019-01-21 DIAGNOSIS — K5790 Diverticulosis of intestine, part unspecified, without perforation or abscess without bleeding: Secondary | ICD-10-CM

## 2019-01-21 DIAGNOSIS — Z23 Encounter for immunization: Secondary | ICD-10-CM

## 2019-01-21 DIAGNOSIS — R202 Paresthesia of skin: Secondary | ICD-10-CM

## 2019-01-21 LAB — POCT INR: INR: 2.3 (ref 2.0–3.0)

## 2019-01-21 NOTE — Progress Notes (Signed)
Name: Christine Burnett   MRN: [id number]    DOB: Oct 23, 1940   Date:01/21/2019       Progress Note  Subjective  Chief Complaint  Chief Complaint  Patient presents with  . Atrial Fibrillation    HPI  Patient states has intermittently feeling off balanced over the past month. States she was walking a week or so ago and started leaning to the right grabbed a chair and symptoms went away. Has had maybe 5 episodes just a few seconds. Does not feel like spinning. No associated, blurry vision, dizziness, nausea, tinnitus, palpitations. Endorses left hand paresthesias- states tingling feeling in her hand for a few seconds and resolves- not associated with the balance. She did run out of her b12 a few weeks aog.    Paroxysmal atrial fibrillation Patient is taking coumadin for over 30 years with INR goal goal 2-3. She is taking 3 mg 2 days a week and 2 mg the other 5 days. Has been on this stable dosage for the past 6 months or so.  Denies excessive bleeding or bruising, dark tarry stools, blood in urine or gums. INR today is 2.3  Denies palpitations, shortness of breath, lightheadedness or dizziness.   Chronic kidney disease Last GFR was 31 on 08/13/2018 Nephrologist: Dr. Holley Raring  Water intake: drinks at least 3 bottles of water a day NSAIDs; avoids   Diverticular disease Has daily BM without straninig Denies diarrhea or constipation, abdominal pain or cramping  PHQ2/9: Depression screen [facility] 2/9 11/12/2018 08/13/2018 07/15/2018 06/28/2018 06/13/2018  Decreased Interest 0 0 0 0 2  Down, Depressed, Hopeless 0 0 0 0 3  PHQ - 2 Score 0 0 0 0 5  Altered sleeping 0 0 0 0 3  Tired, decreased energy 0 0 0 0 2  Change in appetite 0 0 0 0 2  Feeling bad or failure about yourself  0 0 0 0 2  Trouble concentrating 0 0 0 0 0  Moving slowly or fidgety/restless 0 0 0 0 0  Suicidal thoughts 0 0 0 0 2  PHQ-9 Score 0 0 0 0 16  Difficult doing work/chores Not difficult at all Not difficult at all Not  difficult at all Not difficult at all Somewhat difficult  Some recent data might be hidden     PHQ reviewed. Negative  Patient Active Problem List   Diagnosis Date Noted  . Prediabetes 08/13/2018  . Major depressive disorder, recurrent ([place]) 06/13/2018  . Allergic rhinitis 09/27/2017  . Compulsive tobacco user syndrome 09/27/2017  . 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 Chronic kidney disease, stage III (moderate) ([place]) 08/31/2017  . 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 Atrial fibrillation ([place]) 08/09/2017  . 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 Hyperglycemia 11/10/2015  . Cardiac murmur 04/07/2015  . 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 Long term current use of anticoagulant therapy 02/10/2015  . Anxiety 01/05/2015  . Hyperlipidemia 01/05/2015  . Anticoagulation goal of INR 2 to 3 11/16/2014  . HTN (hypertension) 09/24/2014    Past Medical History:  Diagnosis Date  . A-fib ([place])   . Allergic rhinitis   . Anxiety   . Chronic bronchitis ([place])   . Chronic kidney disease, stage III (moderate) ([place]) 08/31/2017   Seeing nephrologist  . CKD (chronic kidney disease), stage III ([place])   . Cornea scar   . Depression  unspecified  . Diverticulosis   . Hx of fracture of left hip 12/24/2017  . Hyperlipidemia   . 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 Hypertension   . Osteoporosis   . Risk for falls   . Situational disturbance   . TIA (transient ischemic attack)     Past Surgical History:  Procedure Laterality Date  . COLONOSCOPY  2016  . ESOPHAGUS SURGERY    . INTRAMEDULLARY (IM) NAIL INTERTROCHANTERIC Left 03/26/2017   Procedure: INTRAMEDULLARY (IM) NAIL INTERTROCHANTRIC;  Surgeon: Hessie Knows, MD;  Location: [facility] ORS;  Service: Orthopedics;   Laterality: Left;    Social History   Tobacco Use  . Smoking status: Former Smoker    Packs/day: 0.25    Years: 61.00    Pack years: 15.25    Types: Cigarettes    Quit date: 03/25/2017    Years since quitting: 1.8  . Smokeless tobacco: Never Used  . Tobacco comment: smoking cessation materials not required  Substance Use Topics  . Alcohol use: No     Current Outpatient Medications:  .  acetaminophen (TYLENOL) 325 MG tablet, Take 1 tablet (325 mg total) by mouth at bedtime as needed., Disp: , Rfl:  .  albuterol (VENTOLIN HFA) 108 (90 Base) MCG/ACT inhaler, Inhale 2 puffs into the lungs every 6 (six) hours as needed for wheezing or shortness of breath., Disp: 1 Inhaler, Rfl: 2 .  atorvastatin (LIPITOR) 10 MG tablet, Take 1 tablet (10 mg total) by mouth at bedtime., Disp: 90 tablet, Rfl: 1 .  Cholecalciferol (VITAMIN D-3) 1000 units CAPS, Take 2 capsules by mouth daily. , Disp: , Rfl:  .  Cyanocobalamin (VITAMIN B-12) 500 MCG SUBL, One tablet dissolved under the tongue once a day, Disp: 100 tablet, Rfl: 3 .  ezetimibe (ZETIA) 10 MG tablet, Take 1 tablet (10 mg total) by mouth daily., Disp: 30 tablet, Rfl: 11 .  Homeopathic Products (ZINC COLD THERAPY PO), Take 1 tablet by mouth daily as needed., Disp: , Rfl:  .  hydrOXYzine (ATARAX/VISTARIL) 10 MG tablet, Take 1-2 tablets (10-20 mg total) by mouth every 12 (twelve) hours as needed for anxiety., Disp: 30 tablet, Rfl: 1 .  PARoxetine (PAXIL) 40 MG tablet, Take 1 tablet (40 mg total) by mouth every morning., Disp: 90 tablet, Rfl: 1 .  warfarin (COUMADIN) 2 MG tablet, Take 3 mg on Mondays and Thursdays, and 2 mg daily all other days OR as directed by your doctor most recently, Disp: 36 tablet, Rfl: 5  Allergies  Allergen Reactions  . Cefuroxime Axetil     caused rash.  . Ciprofloxacin     Unknown  . Codeine     ROS    No other specific complaints in a complete review of systems (except as listed in HPI  above).  Objective  Vitals:   01/21/19 1333  BP: 116/74  Pulse: 97  Resp: 16  Temp: (!) 97.5 F (36.4 C)  TempSrc: Temporal  SpO2: 97%  Weight: 155 lb 9.6 oz (70.6 kg)  Height: 5\' 2"  (1.575 m)     Body mass index is 28.46 kg/m.  Nursing Note and Vital Signs reviewed.  Physical Exam Vitals signs reviewed.  Constitutional:      Appearance: She is well-developed.  HENT:     Head: Normocephalic and atraumatic.  Eyes:     General: Visual field deficit (left eye had herpes zoster) present.  Neck:     Musculoskeletal: Normal range of motion and  neck supple.     Vascular: No carotid bruit.  Cardiovascular:     Rate and Rhythm: Normal rate. Rhythm irregular.     Pulses: Normal pulses.     Heart sounds: Normal heart sounds.  Pulmonary:     Effort: Pulmonary effort is normal.     Breath sounds: Normal breath sounds.  Abdominal:     General: Bowel sounds are normal.     Palpations: Abdomen is soft.     Tenderness: There is no abdominal tenderness.  Musculoskeletal: Normal range of motion.  Skin:    General: Skin is warm and dry.     Capillary Refill: Capillary refill takes less than 2 seconds.  Neurological:     General: No focal deficit present.     Mental Status: She is alert and oriented to person, place, and time.     GCS: GCS eye subscore is 4. GCS verbal subscore is 5. GCS motor subscore is 6.     Cranial Nerves: No dysarthria or facial asymmetry.     Sensory: Sensation is intact. No sensory deficit.     Motor: No weakness.     Coordination: Romberg sign positive. Coordination normal. Heel to Shin Test abnormal. Finger-Nose-Finger Test normal.     Gait: Gait normal.  Psychiatric:        Speech: Speech normal.        Behavior: Behavior normal.        Thought Content: Thought content normal.        Judgment: Judgment normal.        No results found for this or any previous visit (from the past 48 hour(s)).  Assessment & Plan  1. Paroxysmal atrial  fibrillation (HCC) Stable, rate controlled, continue anticoagulation  - POCT INR  2. Long term current use of anticoagulant therapy - POCT INR  3. Anticoagulation goal of INR 2 to 3 - POCT INR  4. Chronic kidney disease, stage III (moderate) (HCC) Discussed hydration, continue follow-up with nephrologist   5. DD (diverticular disease) stable  6. Impairment of balance Discussed concern due to atrial fib for stroke/tia and she has had history of a.fib. patient declined neurology referral and CT. Asymptomatic presently. Has been of b12 for a month will restart meds. Discussed ER precautions  - B12 - CBC with Differential - COMPLETE METABOLIC PANEL WITH GFR - Ambulatory referral to Physical Therapy  7. Paresthesia Discussed ER precautions.  - B12  8. Need for pneumococcal vaccination - Pneumococcal polysaccharide vaccine 23-valent greater than or equal to 2yo subcutaneous/IM     -Red flags and when to present for emergency care or RTC including fever >101.40F, chest pain, shortness of breath, new/worsening/un-resolving symptoms, stroke symptoms reviewed with patient at time of visit. Follow up and care instructions discussed and provided in AVS. -Reviewed Health Maintenance: pneumo vax; flu at follow=up

## 2019-01-21 NOTE — Patient Instructions (Signed)
If you have not heard anything from my staff in a week about any orders/referrals/studies from today, please contact us here to follow-up [phone]  Warning Signs of a Stroke  A stroke is a medical emergency and should be treated right away-every second counts. A stroke is caused by a decrease or block in blood flow to the brain. When this occurs, certain areas of the brain do not get enough oxygen, and brain cells begin to die. A stroke can lead to brain damage and can sometimes be life-threatening. However, if someone having a stroke gets medical treatment right away, he or she has better chances of surviving and recovering from the stroke. Being able to recognize the symptoms of a stroke is very important. Types of strokes There are two main types of strokes:  Ischemic strokes. This is the most common type of stroke. These strokes happen when a blood vessel that supplies blood to the brain is being blocked.  Hemorrhagic strokes. These strokes result from bleeding in the brain due to a blood vessel leaking or bursting (rupturing). A transient ischemic attack (TIA) is a "warning stroke" that causes stroke-like symptoms that go away quickly. Unlike a stroke, a TIA does not cause permanent damage to the brain. However, the symptoms of a TIA are the same as a stroke, and they also require medical treatment right away. Having a TIA is a sign that you are at higher risk for a permanent stroke. Warning signs of a stroke The symptoms of stroke may vary and will reflect the part of the brain that is involved. Symptoms usually happen suddenly. "BE FAST" is an easy way to remember the main warning signs of a stroke. B - Balance Signs are dizziness, sudden trouble walking, or loss of balance. E - Eyes Signs are trouble seeing or a sudden change in vision. F - Face Signs are sudden weakness or numbness of the face, or the face or eyelid drooping on one side. A - Arms Signs are weakness or numbness in  an arm. This happens suddenly and usually on one side of the body. S - Speech Signs are sudden trouble speaking, slurred speech, or trouble understanding what people say. T - Time Time to call emergency services. Write down what time symptoms started. Other signs of a stroke Some less common signs of a stroke include:  A sudden, severe headache with no known cause.  Nausea or vomiting.  Seizure. A stroke may be happening even if only one "BE FAST" symptoms is present. These symptoms may represent a serious problem that is an emergency. Do not wait to see if the symptoms will go away. Get medical help right away. Call your local emergency services (911 in the [place] Do not drive yourself to the hospital. Summary  A stroke is a medical emergency and should be treated right away-every second counts.  "BE FAST" is an easy way to remember the main warning signs of a stroke.  Call local emergency services right away if you or someone else has any stroke symptoms, even if the symptoms go away.  Make note of what time the first symptoms appeared. Emergency responders or emergency room staff will need to know this information.  Do not wait to see if symptoms will go away. Call 911 even if only one of the "BE FAST" symptoms appears. This information is not intended to replace advice given to you by your health care provider. Make sure you discuss any questions you have  with your health care provider. Document Released: 09/08/2016 Document Revised: 05/04/2017 Document Reviewed: 09/08/2016 Elsevier Patient Education  [place].

## 2019-01-22 LAB — CBC WITH DIFFERENTIAL/PLATELET
Absolute Monocytes: 359 cells/uL (ref 200–950)
Basophils Absolute: 38 cells/uL (ref 0–200)
Basophils Relative: 0.6 %
Eosinophils Absolute: 221 cells/uL (ref 15–500)
Eosinophils Relative: 3.5 %
HCT: 37.9 % (ref 35.0–45.0)
Hemoglobin: 12.4 g/dL (ref 11.7–15.5)
Lymphs Abs: 1751 cells/uL (ref 850–3900)
MCH: 32.6 pg (ref 27.0–33.0)
MCHC: 32.7 g/dL (ref 32.0–36.0)
MCV: 99.7 fL (ref 80.0–100.0)
MPV: 9.4 fL (ref 7.5–12.5)
Monocytes Relative: 5.7 %
Neutro Abs: 3931 cells/uL (ref 1500–7800)
Neutrophils Relative %: 62.4 %
Platelets: 209 10*3/uL (ref 140–400)
RBC: 3.8 10*6/uL (ref 3.80–5.10)
RDW: 12.7 % (ref 11.0–15.0)
Total Lymphocyte: 27.8 %
WBC: 6.3 10*3/uL (ref 3.8–10.8)

## 2019-01-22 LAB — COMPLETE METABOLIC PANEL WITH GFR
AG Ratio: 1.3 (calc) (ref 1.0–2.5)
ALT: 14 U/L (ref 6–29)
AST: 17 U/L (ref 10–35)
Albumin: 4 g/dL (ref 3.6–5.1)
Alkaline phosphatase (APISO): 84 U/L (ref 37–153)
BUN/Creatinine Ratio: 16 (calc) (ref 6–22)
BUN: 23 mg/dL (ref 7–25)
CO2: 26 mmol/L (ref 20–32)
Calcium: 9.7 mg/dL (ref 8.6–10.4)
Chloride: 105 mmol/L (ref 98–110)
Creat: 1.48 mg/dL — ABNORMAL HIGH (ref 0.60–0.93)
GFR, Est African American: 39 mL/min/{1.73_m2} — ABNORMAL LOW (ref >=60)
GFR, Est Non African American: 34 mL/min/{1.73_m2} — ABNORMAL LOW (ref >=60)
Globulin: 3.2 g/dL (calc) (ref 1.9–3.7)
Glucose, Bld: 98 mg/dL (ref 65–99)
Potassium: 4.7 mmol/L (ref 3.5–5.3)
Sodium: 139 mmol/L (ref 135–146)
Total Bilirubin: 0.5 mg/dL (ref 0.2–1.2)
Total Protein: 7.2 g/dL (ref 6.1–8.1)

## 2019-01-22 LAB — VITAMIN B12: Vitamin B-12: 389 pg/mL (ref 200–1100)

## 2019-01-28 ENCOUNTER — Ambulatory Visit (INDEPENDENT_AMBULATORY_CARE_PROVIDER_SITE_OTHER): Payer: Medicare Other

## 2019-01-28 VITALS — Ht 62.0 in | Wt 153.0 lb

## 2019-01-28 DIAGNOSIS — Z0000 Encounter for general adult medical examination without abnormal findings: Secondary | ICD-10-CM | POA: Diagnosis not present

## 2019-01-28 NOTE — Progress Notes (Signed)
Subjective:   Christine Burnett is a 78 y.o. female who presents for Medicare Annual (Subsequent) preventive examination.  Virtual Visit via Telephone Note  I connected with Christine Burnett on 01/28/19 at  1:30 PM EDT by telephone and verified that I am speaking with the correct person using two identifiers.  Medicare Annual Wellness visit completed telephonically due to Covid-19 pandemic.   Location: Patient: home Provider: office   I discussed the limitations, risks, security and privacy concerns of performing an evaluation and management service by telephone and the availability of in person appointments. The patient expressed understanding and agreed to proceed.  Some vital signs may be absent or patient reported.   Clemetine Marker, LPN    Review of Systems:   Cardiac Risk Factors include: advanced age (>61men, >89 women);dyslipidemia     Objective:     Vitals: Ht 5\' 2"  (1.575 m)   Wt 153 lb (69.4 kg)   BMI 27.98 kg/m   Body mass index is 27.98 kg/m.  Advanced Directives 01/28/2019 01/24/2018 05/29/2017 04/09/2017 03/25/2017 03/25/2017 01/16/2017  Does Patient Have a Medical Advance Directive? Yes Yes No No Yes Yes Yes  Type of Paramedic of [place];Living will [place];Living will - - [place];Living will [place] will  Does patient want to make changes to medical advance directive? - - - - No - Patient declined Yes (Inpatient - patient defers changing a medical advance directive at this time) -  Copy of [place] in Chart? No - copy requested No - copy requested - - No - copy requested - -  Would patient like information on creating a medical advance directive? - - - - Yes (Inpatient - patient defers creating a medical advance directive at this time) Yes (Inpatient - patient defers creating a medical advance directive at this time) -    Tobacco Social  History   Tobacco Use  Smoking Status Former Smoker  . Packs/day: 0.25  . Years: 61.00  . Pack years: 15.25  . Types: Cigarettes  . Quit date: 03/25/2017  . Years since quitting: 1.8  Smokeless Tobacco Never Used  Tobacco Comment   smoking cessation materials not required     Counseling given: Not Answered Comment: smoking cessation materials not required   Clinical Intake:  Pre-visit preparation completed: Yes  Pain : No/denies pain     BMI - recorded: 27.98 Nutritional Status: BMI 25 -29 Overweight Nutritional Risks: None Diabetes: No  How often do you need to have someone help you when you read instructions, pamphlets, or other written materials from your doctor or pharmacy?: 1 - Never  Interpreter Needed?: No  Information entered by :: Clemetine Marker LPN  Past Medical History:  Diagnosis Date  . A-fib ([place])   . Allergic rhinitis   . Anxiety   . Chronic bronchitis ([place])   . Chronic kidney disease, stage III (moderate) ([place]) 08/31/2017   Seeing nephrologist  . CKD (chronic kidney disease), stage III ([place])   . Cornea scar   . Depression    unspecified  . Diverticulosis   . Hx of fracture of left hip 12/24/2017  . Hyperlipidemia   . 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 Hypertension   . Osteoporosis   . Risk for falls   . Situational disturbance   . TIA (transient ischemic attack)    Past Surgical History:  Procedure Laterality  Date  . COLONOSCOPY  2016  . ESOPHAGUS SURGERY    . INTRAMEDULLARY (IM) NAIL INTERTROCHANTERIC Left 03/26/2017   Procedure: INTRAMEDULLARY (IM) NAIL INTERTROCHANTRIC;  Surgeon: Hessie Knows, MD;  Location: [facility] ORS;  Service: Orthopedics;  Laterality: Left;   Family History  Problem Relation Age of Onset  . Other Mother        [place] posioning  . Heart attack Father   . Prostate cancer Father   . Heart attack Brother   . Diabetes Brother   . Hypertension Daughter   . Kidney  cancer Neg Hx   . Bladder Cancer Neg Hx   . Colon cancer Neg Hx   . Colon polyps Neg Hx   . Rectal cancer Neg Hx    Social History   Socioeconomic History  . Marital status: Divorced    Spouse name: Not on file  . Number of children: 4  . Years of education: Not on file  . Highest education level: 12th grade  Occupational History  . Occupation: Retired  Scientific laboratory technician  . Financial resource strain: Not hard at all  . Food insecurity    Worry: Never true    Inability: Never true  . Transportation needs    Medical: No    Non-medical: No  Tobacco Use  . Smoking status: Former Smoker    Packs/day: 0.25    Years: 61.00    Pack years: 15.25    Types: Cigarettes    Quit date: 03/25/2017    Years since quitting: 1.8  . Smokeless tobacco: Never Used  . Tobacco comment: smoking cessation materials not required  Substance and Sexual Activity  . Alcohol use: No  . Drug use: No  . Sexual activity: Never  Lifestyle  . Physical activity    Days per week: 0 days    Minutes per session: 0 min  . Stress: Only a little  Relationships  . Social connections    Talks on phone: More than three times a week    Gets together: More than three times a week    Attends religious service: More than 4 times per year    Active member of club or organization: No    Attends meetings of clubs or organizations: Never    Relationship status: Divorced  Other Topics Concern  . Not on file  Social History Narrative   Full Code   Current smoker   4 children   Denies alcohol use    Outpatient Encounter Medications as of 01/28/2019  Medication Sig  . acetaminophen (TYLENOL) 325 MG tablet Take 1 tablet (325 mg total) by mouth at bedtime as needed.  Marland Kitchen albuterol (VENTOLIN HFA) 108 (90 Base) MCG/ACT inhaler Inhale 2 puffs into the lungs every 6 (six) hours as needed for wheezing or shortness of breath.  Marland Kitchen atorvastatin (LIPITOR) 10 MG tablet Take 1 tablet (10 mg total) by mouth at bedtime.  .  Cholecalciferol (VITAMIN D-3) 1000 units CAPS Take 2 capsules by mouth daily.   . Cyanocobalamin (VITAMIN B-12) 500 MCG SUBL One tablet dissolved under the tongue once a day  . ezetimibe (ZETIA) 10 MG tablet Take 1 tablet (10 mg total) by mouth daily.  . hydrOXYzine (ATARAX/VISTARIL) 10 MG tablet Take 1-2 tablets (10-20 mg total) by mouth every 12 (twelve) hours as needed for anxiety. (Patient taking differently: Take 10-20 mg by mouth every 12 (twelve) hours as needed for anxiety. Takes very rarely)  . PARoxetine (PAXIL) 40 MG tablet Take 1 tablet (  40 mg total) by mouth every morning.  . warfarin (COUMADIN) 2 MG tablet Take 3 mg on Mondays and Thursdays, and 2 mg daily all other days OR as directed by your doctor most recently  . [DISCONTINUED] Homeopathic Products (ZINC COLD THERAPY PO) Take 1 tablet by mouth daily as needed.   No facility-administered encounter medications on file as of 01/28/2019.     Activities of Daily Living In your present state of health, do you have any difficulty performing the following activities: 01/28/2019 01/21/2019  Hearing? N N  Comment declines hearing aids -  Vision? N N  Difficulty concentrating or making decisions? N N  Walking or climbing stairs? Y Y  Dressing or bathing? N N  Doing errands, shopping? N N  Preparing Food and eating ? N -  Using the Toilet? N -  In the past six months, have you accidently leaked urine? Y -  Comment wears pads at night for protection -  Do you have problems with loss of bowel control? N -  Managing your Medications? N -  Managing your Finances? N -  Housekeeping or managing your Housekeeping? N -  Some recent data might be hidden    Patient Care Team: Delsa Grana, PA-C as PCP - General (Family Medicine) Dagoberto Ligas, MD as Consulting Physician (Internal Medicine) Anthonette Legato, MD as Consulting Physician (Nephrology)    Assessment:   This is a routine wellness examination for Christine Burnett.  Exercise  Activities and Dietary recommendations Current Exercise Habits: The patient does not participate in regular exercise at present, Exercise limited by: orthopedic condition(s)  Goals    . DIET - INCREASE WATER INTAKE     Recommend to drink at least 6-8 8oz glasses of water per day.    . Quit smoking / using tobacco     Recommend to quit smoking. Pt to taper down 1 cigarette a day to 0 cigarettes.        Fall Risk Fall Risk  01/28/2019 01/21/2019 11/12/2018 08/13/2018 06/28/2018  Falls in the past year? 0 0 0 0 0  Number falls in past yr: 0 0 - 0 -  Injury with Fall? 0 0 - 0 -  Comment - - - - -  Risk Factor Category  - - - - -  Risk for fall due to : - - - - -  Risk for fall due to: Comment - - - - -  Follow up Falls prevention discussed - - - -   FALL RISK PREVENTION PERTAINING TO THE HOME:  Any stairs in or around the home? Yes  If so, do they handrails? Yes   Home free of loose throw rugs in walkways, pet beds, electrical cords, etc? Yes  Adequate lighting in your home to reduce risk of falls? Yes   ASSISTIVE DEVICES UTILIZED TO PREVENT FALLS:  Life alert? No  Use of a cane, walker or w/c? Yes  Grab bars in the bathroom? Yes  Shower chair or bench in shower? No  Elevated toilet seat or a handicapped toilet? No   DME ORDERS:  DME order needed?  No   TIMED UP AND GO:  Was the test performed? No . Telephonic visit.   Education: Fall risk prevention has been discussed.  Intervention(s) required? No    Depression Screen PHQ 2/9 Scores 01/28/2019 01/21/2019 11/12/2018 08/13/2018  PHQ - 2 Score 1 0 0 0  PHQ- 9 Score 1 0 0 0     Cognitive Function  6CIT Screen 01/28/2019 01/24/2018 12/25/2016  What Year? 0 points 0 points 0 points  What month? 0 points 0 points 0 points  What time? 0 points 0 points 0 points  Count back from 20 0 points 0 points 0 points  Months in reverse 0 points 0 points 0 points  Repeat phrase 2 points 0 points 6 points  Total Score 2 0 6     Immunization History  Administered Date(s) Administered  . Influenza Split 03/15/2010, 03/21/2011  . Influenza, High Dose Seasonal PF 04/07/2015, 03/22/2016, 03/13/2017, 02/25/2018  . Influenza,inj,Quad PF,6+ Mos 03/19/2013, 03/10/2014  . Pneumococcal Conjugate-13 11/08/2017  . Pneumococcal Polysaccharide-23 01/21/2019  . Tdap 05/29/2017    Qualifies for Shingles Vaccine? Yes . Due for Shingrix. Education has been provided regarding the importance of this vaccine. Pt has been advised to call insurance company to determine out of pocket expense. Advised may also receive vaccine at local pharmacy or Health Dept. Verbalized acceptance and understanding.  Tdap: Up to date  Flu Vaccine: Up to date  Pneumococcal Vaccine: Up to date .   Screening Tests Health Maintenance  Topic Date Due  . INFLUENZA VACCINE  01/04/2019  . TETANUS/TDAP  05/30/2027  . DEXA SCAN  Completed  . PNA vac Low Risk Adult  Completed   Cancer Screenings:  Colorectal Screening: Completed 10/07/12. No longer required.   Mammogram: Completed 10/29/12. Repeat every year; declines repeat screening.  Bone Density: Completed 11/01/11. Results reflect OSTEOPENIA. Repeat every 2 years. Declines repeat screening.    Lung Cancer Screening: (Low Dose CT Chest recommended if Age 64-80 years, 30 pack-year currently smoking OR have quit w/in 15years.) does not qualify.    Additional Screening:  Hepatitis C Screening: no longer required  Vision Screening: Recommended annual ophthalmology exams for early detection of glaucoma and other disorders of the eye. Is the patient up to date with their annual eye exam?  Yes  Who is the provider or what is the name of the office in which the pt attends annual eye exams? Dr. Stann Mainland  Dental Screening: Recommended annual dental exams for proper oral hygiene  Community Resource Referral:  CRR required this visit?  No      Plan:    I have personally reviewed and addressed the  Medicare Annual Wellness questionnaire and have noted the following in the patient's chart:  A. Medical and social history B. Use of alcohol, tobacco or illicit drugs  C. Current medications and supplements D. Functional ability and status E.  Nutritional status F.  Physical activity G. Advance directives H. List of other physicians I.  Hospitalizations, surgeries, and ER visits in previous 12 months J.  [place] such as hearing and vision if needed, cognitive and depression L. Referrals and appointments   In addition, I have reviewed and discussed with patient certain preventive protocols, quality metrics, and best practice recommendations. A written personalized care plan for preventive services as well as general preventive health recommendations were provided to patient.   Signed,  Clemetine Marker, LPN Nurse Health Advisor   Nurse Notes: pt doing well and appreciative of visit today.

## 2019-01-28 NOTE — Patient Instructions (Signed)
Christine Burnett , Thank you for taking time to come for your Medicare Wellness Visit. I appreciate your ongoing commitment to your health goals. Please review the following plan we discussed and let me know if I can assist you in the future.   Screening recommendations/referrals: Colonoscopy: done 10/07/12 Mammogram: done 10/29/12 Bone Density: done 11/01/11 Recommended yearly ophthalmology/optometry visit for glaucoma screening and checkup Recommended yearly dental visit for hygiene and checkup  Vaccinations: Influenza vaccine: done 02/25/18 Pneumococcal vaccine: done 01/21/19 Tdap vaccine: done 05/29/17 Shingles vaccine: Shingrix discussed. Please contact your pharmacy for coverage information.   Advanced directives: Please bring a copy of your health care power of attorney and living will to the office at your convenience.  Conditions/risks identified: recommend increasing physical activity   Next appointment: Please follow up in one year for your Medicare Annual Wellness visit.     Preventive Care 79 Years and Older, Female Preventive care refers to lifestyle choices and visits with your health care provider that can promote health and wellness. What does preventive care include?  A yearly physical exam. This is also called an annual well check.  Dental exams once or twice a year.  Routine eye exams. Ask your health care provider how often you should have your eyes checked.  Personal lifestyle choices, including:  Daily care of your teeth and gums.  Regular physical activity.  Eating a healthy diet.  Avoiding tobacco and drug use.  Limiting alcohol use.  Practicing safe sex.  Taking low-dose aspirin every day.  Taking vitamin and mineral supplements as recommended by your health care provider. What happens during an annual well check? The services and screenings done by your health care provider during your annual well check will depend on your age, overall health,  lifestyle risk factors, and family history of disease. Counseling  Your health care provider may ask you questions about your:  Alcohol use.  Tobacco use.  Drug use.  Emotional well-being.  Home and relationship well-being.  Sexual activity.  Eating habits.  History of falls.  Memory and ability to understand (cognition).  Work and work Statistician.  Reproductive health. Screening  You may have the following tests or measurements:  Height, weight, and BMI.  Blood pressure.  Lipid and cholesterol levels. These may be checked every 5 years, or more frequently if you are over 92 years old.  Skin check.  Lung cancer screening. You may have this screening every year starting at age 51 if you have a 30-pack-year history of smoking and currently smoke or have quit within the past 15 years.  Fecal occult blood test (FOBT) of the stool. You may have this test every year starting at age 18.  Flexible sigmoidoscopy or colonoscopy. You may have a sigmoidoscopy every 5 years or a colonoscopy every 10 years starting at age 39.  Hepatitis C blood test.  Hepatitis B blood test.  Sexually transmitted disease (STD) testing.  Diabetes screening. This is done by checking your blood sugar (glucose) after you have not eaten for a while (fasting). You may have this done every 1-3 years.  Bone density scan. This is done to screen for osteoporosis. You may have this done starting at age 40.  Mammogram. This may be done every 1-2 years. Talk to your health care provider about how often you should have regular mammograms. Talk with your health care provider about your test results, treatment options, and if necessary, the need for more tests. Vaccines  Your health care provider  may recommend certain vaccines, such as:  Influenza vaccine. This is recommended every year.  Tetanus, diphtheria, and acellular pertussis (Tdap, Td) vaccine. You may need a Td booster every 10 years.  Zoster  vaccine. You may need this after age 57.  Pneumococcal 13-valent conjugate (PCV13) vaccine. One dose is recommended after age 55.  Pneumococcal polysaccharide (PPSV23) vaccine. One dose is recommended after age 56. Talk to your health care provider about which screenings and vaccines you need and how often you need them. This information is not intended to replace advice given to you by your health care provider. Make sure you discuss any questions you have with your health care provider. Document Released: 06/18/2015 Document Revised: 02/09/2016 Document Reviewed: 03/23/2015 Elsevier Interactive Patient Education  2017 [place] Prevention in the Home Falls can cause injuries. They can happen to people of all ages. There are many things you can do to make your home safe and to help prevent falls. What can I do on the outside of my home?  Regularly fix the edges of walkways and driveways and fix any cracks.  Remove anything that might make you trip as you walk through a door, such as a raised step or threshold.  Trim any bushes or trees on the path to your home.  Use bright outdoor lighting.  Clear any walking paths of anything that might make someone trip, such as rocks or tools.  Regularly check to see if handrails are loose or broken. Make sure that both sides of any steps have handrails.  Any raised decks and porches should have guardrails on the edges.  Have any leaves, snow, or ice cleared regularly.  Use sand or salt on walking paths during winter.  Clean up any spills in your garage right away. This includes oil or grease spills. What can I do in the bathroom?  Use night lights.  Install grab bars by the toilet and in the tub and shower. Do not use towel bars as grab bars.  Use non-skid mats or decals in the tub or shower.  If you need to sit down in the shower, use a plastic, non-slip stool.  Keep the floor dry. Clean up any water that spills on the  floor as soon as it happens.  Remove soap buildup in the tub or shower regularly.  Attach bath mats securely with double-sided non-slip rug tape.  Do not have throw rugs and other things on the floor that can make you trip. What can I do in the bedroom?  Use night lights.  Make sure that you have a light by your bed that is easy to reach.  Do not use any sheets or blankets that are too big for your bed. They should not hang down onto the floor.  Have a firm chair that has side arms. You can use this for support while you get dressed.  Do not have throw rugs and other things on the floor that can make you trip. What can I do in the kitchen?  Clean up any spills right away.  Avoid walking on wet floors.  Keep items that you use a lot in easy-to-reach places.  If you need to reach something above you, use a strong step stool that has a grab bar.  Keep electrical cords out of the way.  Do not use floor polish or wax that makes floors slippery. If you must use wax, use non-skid floor wax.  Do not have throw rugs  and other things on the floor that can make you trip. What can I do with my stairs?  Do not leave any items on the stairs.  Make sure that there are handrails on both sides of the stairs and use them. Fix handrails that are broken or loose. Make sure that handrails are as long as the stairways.  Check any carpeting to make sure that it is firmly attached to the stairs. Fix any carpet that is loose or worn.  Avoid having throw rugs at the top or bottom of the stairs. If you do have throw rugs, attach them to the floor with carpet tape.  Make sure that you have a light switch at the top of the stairs and the bottom of the stairs. If you do not have them, ask someone to add them for you. What else can I do to help prevent falls?  Wear shoes that:  Do not have high heels.  Have rubber bottoms.  Are comfortable and fit you well.  Are closed at the toe. Do not wear  sandals.  If you use a stepladder:  Make sure that it is fully opened. Do not climb a closed stepladder.  Make sure that both sides of the stepladder are locked into place.  Ask someone to hold it for you, if possible.  Clearly mark and make sure that you can see:  Any grab bars or handrails.  First and last steps.  Where the edge of each step is.  Use tools that help you move around (mobility aids) if they are needed. These include:  Canes.  Walkers.  Scooters.  Crutches.  Turn on the lights when you go into a dark area. Replace any light bulbs as soon as they burn out.  Set up your furniture so you have a clear path. Avoid moving your furniture around.  If any of your floors are uneven, fix them.  If there are any pets around you, be aware of where they are.  Review your medicines with your doctor. Some medicines can make you feel dizzy. This can increase your chance of falling. Ask your doctor what other things that you can do to help prevent falls. This information is not intended to replace advice given to you by your health care provider. Make sure you discuss any questions you have with your health care provider. Document Released: 03/18/2009 Document Revised: 10/28/2015 Document Reviewed: 06/26/2014 Elsevier Interactive Patient Education  2017 [employer].

## 2019-02-06 ENCOUNTER — Other Ambulatory Visit: Payer: Self-pay | Admitting: Family Medicine

## 2019-02-06 DIAGNOSIS — G459 Transient cerebral ischemic attack, unspecified: Secondary | ICD-10-CM

## 2019-02-06 DIAGNOSIS — Z5181 Encounter for therapeutic drug level monitoring: Secondary | ICD-10-CM

## 2019-02-06 DIAGNOSIS — Z7901 Long term (current) use of anticoagulants: Secondary | ICD-10-CM

## 2019-02-18 ENCOUNTER — Ambulatory Visit (INDEPENDENT_AMBULATORY_CARE_PROVIDER_SITE_OTHER): Payer: Medicare Other | Admitting: Family Medicine

## 2019-02-18 ENCOUNTER — Encounter: Payer: Self-pay | Admitting: Family Medicine

## 2019-02-18 ENCOUNTER — Other Ambulatory Visit: Payer: Self-pay

## 2019-02-18 VITALS — BP 130/74 | HR 74 | Temp 97.3°F | Resp 14 | Ht 62.0 in | Wt 150.9 lb

## 2019-02-18 DIAGNOSIS — E782 Mixed hyperlipidemia: Secondary | ICD-10-CM | POA: Diagnosis not present

## 2019-02-18 DIAGNOSIS — I480 Paroxysmal atrial fibrillation: Secondary | ICD-10-CM | POA: Diagnosis not present

## 2019-02-18 DIAGNOSIS — R9431 Abnormal electrocardiogram [ECG] [EKG]: Secondary | ICD-10-CM

## 2019-02-18 DIAGNOSIS — Z7901 Long term (current) use of anticoagulants: Secondary | ICD-10-CM

## 2019-02-18 DIAGNOSIS — Z5181 Encounter for therapeutic drug level monitoring: Secondary | ICD-10-CM | POA: Diagnosis not present

## 2019-02-18 DIAGNOSIS — R42 Dizziness and giddiness: Secondary | ICD-10-CM

## 2019-02-18 DIAGNOSIS — R2689 Other abnormalities of gait and mobility: Secondary | ICD-10-CM | POA: Diagnosis not present

## 2019-02-18 LAB — POCT INR: INR: 2.7 (ref 2.0–3.0)

## 2019-02-18 NOTE — Progress Notes (Signed)
Name: Christine Burnett   MRN: [id number]    DOB: 11-18-1940   Date:02/18/2019       Progress Note  Chief Complaint  Patient presents with  . Anticoagulation    INR:2.7  . Gait Problem    balance issues     Subjective:   Christine Burnett is a 78 y.o. female, presents to clinic for routine follow up on the conditions listed above.  Here for INR monitoring for coumadin for A fib. Lab Results  Component Value Date   INR 2.7 02/18/2019   INR 2.3 01/21/2019   INR 2.4 12/10/2018   Dosing has been unchanged in the last several months, she is taking coumadin 2mg  daily except for 3mg  on M and Th.   Paroxysmal atrial fibrillation Patient is taking coumadin for over 30 years with INR goal goal 2-3. She is taking 3 mg 2 days a week and 2 mg the other 5 days. Has been on this stable dosage for the past 6 months or so.  Denies excessive bleeding or bruising, dark tarry stools, blood in urine or gums. INR today is 2.3  Denies palpitations, shortness of breath, lightheadedness or dizziness.   She is also here for "Balance issues" follow up from a month ago seeing the NP who recently left the practice HPI from 01/21/2019: Patient states has intermittently feeling off balanced over the past month. States she was walking a week or so ago and started leaning to the right grabbed a chair and symptoms went away. Has had maybe 5 episodes just a few seconds. Does not feel like spinning. No associated, blurry vision, dizziness, nausea, tinnitus, palpitations. Endorses left hand paresthesias- states tingling feeling in her hand for a few seconds and resolves- not associated with the balance. She did run out of her b12 a few weeks aog.   Today 02/18/2019: Balance issues continue, occur when she stands up and walks or gets out of the car No passing out episodes but sometimes she feels very off balance like she is going to fall.  Lasts several seconds up to 2 minutes.  Less sx if she takes her time.   It has been consistently happening for more than a month, she is being careful not to fall, in the exam room she has difficulty standing up or going from laying to sitting up.  She denies any sensation of dizziness or loss of balance she does feel more lightheaded like she is going to pass out.  She denies any chest pain, diaphoresis, numbness or tingling with this.  She has had no falls syncopal episodes, she denies any stumbling or near fall.  She denies any headaches, visual disturbances, facial droop, slurred speech, confusion, chest pain, palpitations associated with her symptoms.   Patient Active Problem List   Diagnosis Date Noted  . Prediabetes 08/13/2018  . Major depressive disorder, recurrent ([place]) 06/13/2018  . Allergic rhinitis 09/27/2017  . Compulsive tobacco user syndrome 09/27/2017  . 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 Chronic kidney disease, stage III (moderate) ([place]) 08/31/2017  . Hyperparathyroidism, secondary renal ([place]) 08/31/2017  . Atrial fibrillation ([place]) 08/09/2017  . 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 Hyperglycemia 11/10/2015  . Cardiac murmur 04/07/2015  . 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 Long term current use of anticoagulant therapy 02/10/2015  . Anxiety 01/05/2015  . Hyperlipidemia 01/05/2015  .  Anticoagulation goal of INR 2 to 3 11/16/2014  . HTN (hypertension) 09/24/2014    Past Surgical History:  Procedure Laterality Date  . COLONOSCOPY  2016  . ESOPHAGUS SURGERY    . INTRAMEDULLARY (IM) NAIL INTERTROCHANTERIC Left 03/26/2017   Procedure: INTRAMEDULLARY (IM) NAIL INTERTROCHANTRIC;  Surgeon: Hessie Knows, MD;  Location: [facility] ORS;  Service: Orthopedics;  Laterality: Left;    Family History  Problem Relation Age of Onset  . Other Mother        [place] posioning  . Heart attack  Father   . Prostate cancer Father   . Heart attack Brother   . Diabetes Brother   . Hypertension Daughter   . Kidney cancer Neg Hx   . Bladder Cancer Neg Hx   . Colon cancer Neg Hx   . Colon polyps Neg Hx   . Rectal cancer Neg Hx     Social History   Socioeconomic History  . Marital status: Divorced    Spouse name: Not on file  . Number of children: 4  . Years of education: Not on file  . Highest education level: 12th grade  Occupational History  . Occupation: Retired  Scientific laboratory technician  . Financial resource strain: Not hard at all  . Food insecurity    Worry: Never true    Inability: Never true  . Transportation needs    Medical: No    Non-medical: No  Tobacco Use  . Smoking status: Former Smoker    Packs/day: 0.25    Years: 61.00    Pack years: 15.25    Types: Cigarettes    Quit date: 03/25/2017    Years since quitting: 1.9  . Smokeless tobacco: Never Used  . Tobacco comment: smoking cessation materials not required  Substance and Sexual Activity  . Alcohol use: No  . Drug use: No  . Sexual activity: Never  Lifestyle  . Physical activity    Days per week: 0 days    Minutes per session: 0 min  . Stress: Only a little  Relationships  . Social connections    Talks on phone: More than three times a week    Gets together: More than three times a week    Attends religious service: More than 4 times per year    Active member of club or organization: No    Attends meetings of clubs or organizations: Never    Relationship status: Divorced  . Intimate partner violence    Fear of current or ex partner: No    Emotionally abused: No    Physically abused: No    Forced sexual activity: No  Other Topics Concern  . Not on file  Social History Narrative   Full Code   Current smoker   4 children   Denies alcohol use     Current Outpatient Medications:  .  acetaminophen (TYLENOL) 325 MG tablet, Take 1 tablet (325 mg total) by mouth at bedtime as needed., Disp: , Rfl:   .  albuterol (VENTOLIN HFA) 108 (90 Base) MCG/ACT inhaler, Inhale 2 puffs into the lungs every 6 (six) hours as needed for wheezing or shortness of breath., Disp: 1 Inhaler, Rfl: 2 .  atorvastatin (LIPITOR) 10 MG tablet, Take 1 tablet (10 mg total) by mouth at bedtime., Disp: 90 tablet, Rfl: 1 .  Cholecalciferol (VITAMIN D-3) 1000 units CAPS, Take 2 capsules by mouth daily. , Disp: , Rfl:  .  Cyanocobalamin (VITAMIN B-12) 500 MCG SUBL, One tablet dissolved under the  tongue once a day, Disp: 100 tablet, Rfl: 3 .  ezetimibe (ZETIA) 10 MG tablet, Take 1 tablet (10 mg total) by mouth daily., Disp: 30 tablet, Rfl: 11 .  hydrOXYzine (ATARAX/VISTARIL) 10 MG tablet, Take 1-2 tablets (10-20 mg total) by mouth every 12 (twelve) hours as needed for anxiety. (Patient taking differently: Take 10-20 mg by mouth every 12 (twelve) hours as needed for anxiety. Takes very rarely), Disp: 30 tablet, Rfl: 1 .  PARoxetine (PAXIL) 40 MG tablet, Take 1 tablet (40 mg total) by mouth every morning., Disp: 90 tablet, Rfl: 1 .  warfarin (COUMADIN) 2 MG tablet, TAKE 3 MG ON  MONDAYS AND THURSDAYS, AND 2 MG DAILY ALL OTHER DAYS OR AS DIRCTED BY YOUR DOCTOR MOST RECENTLY, Disp: 36 tablet, Rfl: 0  Allergies  Allergen Reactions  . Cefuroxime Axetil     caused rash.  . Ciprofloxacin     Unknown  . Codeine     I personally reviewed active problem list, medication list, allergies, notes from last encounter, lab results with the patient/caregiver today.  Review of Systems  Constitutional: Negative.   HENT: Negative.   Eyes: Negative.   Respiratory: Negative.   Cardiovascular: Negative.   Gastrointestinal: Negative.   Endocrine: Negative.   Genitourinary: Negative.   Musculoskeletal: Negative.   Skin: Negative.   Allergic/Immunologic: Negative.   Neurological: Negative.   Hematological: Negative.   Psychiatric/Behavioral: Negative.   All other systems reviewed and are negative.    Objective:    Vitals:    02/18/19 1345  BP: 130/74  Pulse: 74  Resp: 14  Temp: (!) 97.3 F (36.3 C)  SpO2: 94%  Weight: 150 lb 14.4 oz (68.4 kg)  Height: 5\' 2"  (1.575 m)    Body mass index is 27.6 kg/m.  Physical Exam Vitals signs and nursing note reviewed.  Constitutional:      General: She is not in acute distress.    Appearance: Normal appearance. She is well-developed. She is not ill-appearing, toxic-appearing or diaphoretic.     Interventions: Face mask in place.  HENT:     Head: Normocephalic and atraumatic.     Right Ear: Tympanic membrane, ear canal and external ear normal.     Left Ear: Tympanic membrane, ear canal and external ear normal.     Nose: Nose normal. No congestion or rhinorrhea.     Mouth/Throat:     Mouth: Mucous membranes are dry.     Pharynx: Oropharynx is clear. No oropharyngeal exudate or posterior oropharyngeal erythema.  Eyes:     General: Lids are normal. No scleral icterus.       Right eye: No discharge.        Left eye: No discharge.     Conjunctiva/sclera: Conjunctivae normal.     Pupils: Pupils are equal, round, and reactive to light.     Comments: Left cataract scar over half of the iris and pupil  Neck:     Musculoskeletal: Normal range of motion and neck supple.     Vascular: No carotid bruit.     Trachea: Phonation normal. No tracheal deviation.  Cardiovascular:     Rate and Rhythm: Normal rate and regular rhythm.  No extrasystoles are present.    Chest Wall: PMI is not displaced. No thrill.     Pulses: Normal pulses.          Radial pulses are 2+ on the right side and 2+ on the left side.       Dorsalis  pedis pulses are 2+ on the right side and 2+ on the left side.       Posterior tibial pulses are 2+ on the right side and 2+ on the left side.     Heart sounds: Normal heart sounds. No murmur. No friction rub. No gallop.   Pulmonary:     Effort: Pulmonary effort is normal. No respiratory distress.     Breath sounds: Normal breath sounds. No stridor. No  wheezing, rhonchi or rales.  Chest:     Chest wall: No tenderness.  Abdominal:     General: Bowel sounds are normal. There is no distension.     Palpations: Abdomen is soft.     Tenderness: There is no abdominal tenderness. There is no guarding or rebound.  Musculoskeletal: Normal range of motion.        General: No deformity.     Right lower leg: No edema.     Left lower leg: No edema.  Lymphadenopathy:     Cervical: No cervical adenopathy.  Skin:    General: Skin is warm and dry.     Capillary Refill: Capillary refill takes less than 2 seconds.     Coloration: Skin is not jaundiced or pale.     Findings: No bruising or rash.  Neurological:     Mental Status: She is alert and oriented to person, place, and time.     Sensory: Sensation is intact.     Motor: Motor function is intact. No abnormal muscle tone.     Gait: Gait normal.     Comments: MENTAL STATUS: AAOx3, memory intact, fund of knowledge appropriate  LANG/SPEECH: Naming and repetition intact, fluent, no dysarthria, follows 3-step commands, answers questions appropriately   CRANIAL NERVES:   II: Pupils equal and reactive, no RAPD   III, IV, VI: EOM intact, no gaze preference or deviation, no nystagmus.   V: normal sensation in V1, V2, and V3 segments bilaterally   VII: no asymmetry, no nasolabial fold flattening   VIII: normal hearing to speech   IX, X: normal palatal elevation, no uvular deviation   XI: 5/5 head turn and 5/5 shoulder shrug bilaterally   XII: midline tongue protrusion  MOTOR:  5/5 bilateral grip strength 5/5 strength dorsiflexion/plantarflexion b/l SENSORY:  Normal to light touch Romberg absent  COORD: Normal finger to nose and heel to shin, no tremor, no dysmetria  STATION: normal stance, no truncal ataxia   Psychiatric:        Mood and Affect: Mood normal.        Speech: Speech normal.        Behavior: Behavior normal.      ECG:  NSR, HR 73, normal axis, non-specific t wave  abnormalities No change from prior ECG compared 03/30/2017  Orthostatics:   Done in room, no significant change to BP or HR but pt very sx with positional changes and balance temporarily unsteady  Recent Results (from the past 2160 hour(s))  POCT INR     Status: Normal   Collection Time: 12/10/18  2:19 PM  Result Value Ref Range   INR 2.4 2.0 - 3.0  POCT INR     Status: Normal   Collection Time: 01/21/19  1:38 PM  Result Value Ref Range   INR 2.3 2.0 - 3.0  B12     Status: None   Collection Time: 01/21/19  2:19 PM  Result Value Ref Range   Vitamin B-12 389 200 - 1,100 pg/mL  Comment: . Please Note: Although the reference range for vitamin B12 is 774 233 6779 pg/mL, it has been reported that between 5 and 10% of patients with values between 200 and 400 pg/mL may experience neuropsychiatric and hematologic abnormalities due to occult B12 deficiency; less than 1% of patients with values above 400 pg/mL will have symptoms. .   CBC with Differential     Status: None   Collection Time: 01/21/19  2:19 PM  Result Value Ref Range   WBC 6.3 3.8 - 10.8 Thousand/uL   RBC 3.80 3.80 - 5.10 Million/uL   Hemoglobin 12.4 11.7 - 15.5 g/dL   HCT 37.9 35.0 - 45.0 %   MCV 99.7 80.0 - 100.0 fL   MCH 32.6 27.0 - 33.0 pg   MCHC 32.7 32.0 - 36.0 g/dL   RDW 12.7 11.0 - 15.0 %   Platelets 209 140 - 400 Thousand/uL   MPV 9.4 7.5 - 12.5 fL   Neutro Abs 3,931 1,500 - 7,800 cells/uL   Lymphs Abs 1,751 850 - 3,900 cells/uL   Absolute Monocytes 359 200 - 950 cells/uL   Eosinophils Absolute 221 15 - 500 cells/uL   Basophils Absolute 38 0 - 200 cells/uL   Neutrophils Relative % 62.4 %   Total Lymphocyte 27.8 %   Monocytes Relative 5.7 %   Eosinophils Relative 3.5 %   Basophils Relative 0.6 %  COMPLETE METABOLIC PANEL WITH GFR     Status: Abnormal   Collection Time: 01/21/19  2:19 PM  Result Value Ref Range   Glucose, Bld 98 65 - 99 mg/dL    Comment: .            Fasting reference interval .     BUN 23 7 - 25 mg/dL   Creat 1.48 (H) 0.60 - 0.93 mg/dL    Comment: For patients >66 years of age, the reference limit for Creatinine is approximately 13% higher for people identified as African-American. .    GFR, Est Non African American 34 (L) > OR = 60 mL/min/1.6m2   GFR, Est African American 39 (L) > OR = 60 mL/min/1.55m2   BUN/Creatinine Ratio 16 6 - 22 (calc)   Sodium 139 135 - 146 mmol/L   Potassium 4.7 3.5 - 5.3 mmol/L   Chloride 105 98 - 110 mmol/L   CO2 26 20 - 32 mmol/L   Calcium 9.7 8.6 - 10.4 mg/dL   Total Protein 7.2 6.1 - 8.1 g/dL   Albumin 4.0 3.6 - 5.1 g/dL   Globulin 3.2 1.9 - 3.7 g/dL (calc)   AG Ratio 1.3 1.0 - 2.5 (calc)   Total Bilirubin 0.5 0.2 - 1.2 mg/dL   Alkaline phosphatase (APISO) 84 37 - 153 U/L   AST 17 10 - 35 U/L   ALT 14 6 - 29 U/L  POCT INR     Status: Abnormal   Collection Time: 02/18/19  1:59 PM  Result Value Ref Range   INR 2.7 2.0 - 3.0      PHQ2/9: Depression screen [facility] 2/9 02/18/2019 01/28/2019 01/21/2019 11/12/2018 08/13/2018  Decreased Interest 0 0 0 0 0  Down, Depressed, Hopeless 0 1 0 0 0  PHQ - 2 Score 0 1 0 0 0  Altered sleeping 0 0 0 0 0  Tired, decreased energy 0 0 0 0 0  Change in appetite 0 0 0 0 0  Feeling bad or failure about yourself  0 0 0 0 0  Trouble concentrating 0 0 0 0 0  Moving slowly or fidgety/restless 0 0 0 0 0  Suicidal thoughts 0 0 0 0 0  PHQ-9 Score 0 1 0 0 0  Difficult doing work/chores Not difficult at all Not difficult at all Not difficult at all Not difficult at all Not difficult at all  Some recent data might be hidden    phq 9 is negative Reviewed by me today  Fall Risk: Fall Risk  02/18/2019 01/28/2019 01/21/2019 11/12/2018 08/13/2018  Falls in the past year? 0 0 0 0 0  Number falls in past yr: 0 0 0 - 0  Injury with Fall? 0 0 0 - 0  Comment - - - - -  Risk Factor Category  - - - - -  Risk for fall due to : - - - - -  Risk for fall due to: Comment - - - - -  Follow up - Falls prevention discussed  - - -    Functional Status Survey: Is the patient deaf or have difficulty hearing?: No Does the patient have difficulty seeing, even when wearing glasses/contacts?: No Does the patient have difficulty concentrating, remembering, or making decisions?: No Does the patient have difficulty walking or climbing stairs?: No Does the patient have difficulty dressing or bathing?: No Does the patient have difficulty doing errands alone such as visiting a doctor's office or shopping?: No    Assessment & Plan:      ICD-10-CM   1. Anticoagulation goal of INR 2 to 3  Z51.81 POCT INR   Z79.01   2. Paroxysmal atrial fibrillation (HCC)  I48.0 POCT INR    ECHOCARDIOGRAM COMPLETE   pt with RRR here, no meds for rate or rhythm control, pt new to me - last A&P states intermittent managed by coumadin  3. Lightheaded  R42 EKG 12-Lead   very symptomatic, with standing up and sitting up, lasts several seconds to minutes - VSS neg orthostatics, doesnt seem like vertigo - ECHO? neuro? cardiology?  4. Abnormal EKG  R94.31 ECHOCARDIOGRAM COMPLETE  5. Postural lightheadedness  R42 ECHOCARDIOGRAM COMPLETE  6. Mixed hyperlipidemia  X91.4 COMPLETE METABOLIC PANEL WITH GFR    Lipid panel   due for fasting lipid panel, last June 2020, continue meds, labs with next INR      Pt here for INR check, today 2.7, therapeutic  Pt very symptomatic with postural changes, I have urged her to see specialist for further eval - cardiologist initially since HR changes and she seems more lightheaded, although not orthostatic.  She refuses and the only thing that may be able to further work up outpt w/o specialist is repeating ECHO.  EKG showed no changes from last, Orthostatics unchanged though shes extremely sx.  She had no a non-focal neuro exam and she refused any CT imaging of brain and refused neuro consult.  I explained CVA/TIA sx, and strongly urged her to be careful with positional changes due to fall risk and HIGH risk of  bleed with coumadin if she should happen to fall.  Will be consulting with my SP on her presentation.  I have urged to referral to neuro and cardiology but patient has refused.  I have attempted to research the chart extensively and several years ago with TIA presentation patient was put on Coumadin I cannot see in the past where she is been in active A. fib and she is currently not rate or rhythm controlled.   Return in about 1 month (around 03/20/2019) for INR recheck, also f/up ASAP  if any worsening of lightheadedness/balance.   Delsa Grana, PA-C 02/18/19 2:09 PM

## 2019-03-06 ENCOUNTER — Encounter: Payer: Self-pay | Admitting: Cardiology

## 2019-03-06 ENCOUNTER — Other Ambulatory Visit: Payer: Self-pay

## 2019-03-06 ENCOUNTER — Ambulatory Visit (INDEPENDENT_AMBULATORY_CARE_PROVIDER_SITE_OTHER): Payer: Medicare Other | Admitting: Cardiology

## 2019-03-06 VITALS — BP 130/80 | HR 86 | Temp 96.4°F | Ht 67.0 in | Wt 155.0 lb

## 2019-03-06 DIAGNOSIS — I480 Paroxysmal atrial fibrillation: Secondary | ICD-10-CM

## 2019-03-06 DIAGNOSIS — R42 Dizziness and giddiness: Secondary | ICD-10-CM | POA: Diagnosis not present

## 2019-03-06 DIAGNOSIS — R0600 Dyspnea, unspecified: Secondary | ICD-10-CM | POA: Diagnosis not present

## 2019-03-06 DIAGNOSIS — E7800 Pure hypercholesterolemia, unspecified: Secondary | ICD-10-CM

## 2019-03-06 DIAGNOSIS — R0609 Other forms of dyspnea: Secondary | ICD-10-CM

## 2019-03-06 NOTE — Patient Instructions (Addendum)
Medication Instructions:  - Your physician recommends that you continue on your current medications as directed. Please refer to the Current Medication list given to you today.  If you need a refill on your cardiac medications before your next appointment, please call your pharmacy.   Lab work: - none ordered  If you have labs (blood work) drawn today and your tests are completely normal, you will receive your results only by: Marland Kitchen MyChart Message (if you have MyChart) OR . A paper copy in the mail If you have any lab test that is abnormal or we need to change your treatment, we will call you to review the results.  Testing/Procedures: - Your physician has requested that you have a lexiscan myoview.  [place]  Your caregiver has ordered a Stress Test with nuclear imaging. The purpose of this test is to evaluate the blood supply to your heart muscle. This procedure is referred to as a "Non-Invasive Stress Test." This is because other than having an IV started in your vein, nothing is inserted or "invades" your body. Cardiac stress tests are done to find areas of poor blood flow to the heart by determining the extent of coronary artery disease (CAD). Some patients exercise on a treadmill, which naturally increases the blood flow to your heart, while others who are  unable to walk on a treadmill due to physical limitations have a pharmacologic/chemical stress agent called Lexiscan . This medicine will mimic walking on a treadmill by temporarily increasing your coronary blood flow.   Please note: these test may take anywhere between 2-4 hours to complete  PLEASE REPORT TO [place] AT THE FIRST DESK WILL DIRECT YOU WHERE TO GO  Date of Procedure:_____________________________________  Arrival Time for Procedure:______________________________  Instructions regarding medication:   __x__ : You may take all of your regular medications with enough water to get  them down safely the morning of your test.  PLEASE NOTIFY THE OFFICE AT LEAST 24 HOURS IN ADVANCE IF YOU ARE UNABLE TO [place].  [phone] AND  PLEASE NOTIFY NUCLEAR MEDICINE AT [facility] AT LEAST 24 HOURS IN ADVANCE IF YOU ARE UNABLE TO KEEP YOUR APPOINTMENT. [phone]  How to prepare for your Myoview test:  1. Do not eat or drink after midnight 2. No caffeine for 24 hours prior to test 3. No smoking 24 hours prior to test. 4. Your medication may be taken with water.  If your doctor stopped a medication because of this test, do not take that medication. 5. Ladies, please do not wear dresses.  Skirts or pants are appropriate. Please wear a short sleeve shirt. 6. No perfume, cologne or lotion. 7. Wear comfortable walking shoes. No heels!   Follow-Up: At [facility], you and your health needs are our priority.  As part of our continuing mission to provide you with exceptional heart care, we have created designated Provider Care Teams.  These Care Teams include your primary Cardiologist (physician) and Advanced Practice Providers (APPs -  Physician Assistants and Nurse Practitioners) who all work together to provide you with the care you need, when you need it. Marland Kitchen after you stress test  Any Other Special Instructions Will Be Listed Below (If Applicable). - N/A   Cardiac Nuclear Scan A cardiac nuclear scan is a test that measures blood flow to the heart when a person is resting and when he or she is exercising. The test looks for problems such as:  Not enough  blood reaching a portion of the heart.  The heart muscle not working normally. You may need this test if:  You have heart disease.  You have had abnormal lab results.  You have had heart surgery or a balloon procedure to open up blocked arteries (angioplasty).  You have chest pain.  You have shortness of breath. In this test, a radioactive dye (tracer) is injected into your bloodstream. After the tracer has  traveled to your heart, an imaging device is used to measure how much of the tracer is absorbed by or distributed to various areas of your heart. This procedure is usually done at a hospital and takes 2-4 hours. Tell a health care provider about:  Any allergies you have.  All medicines you are taking, including vitamins, herbs, eye drops, creams, and over-the-counter medicines.  Any problems you or family members have had with anesthetic medicines.  Any blood disorders you have.  Any surgeries you have had.  Any medical conditions you have.  Whether you are pregnant or may be pregnant. What are the risks? Generally, this is a safe procedure. However, problems may occur, including:  Serious chest pain and heart attack. This is only a risk if the stress portion of the test is done.  Rapid heartbeat.  Sensation of warmth in your chest. This usually passes quickly.  Allergic reaction to the tracer. What happens before the procedure?  Ask your health care provider about changing or stopping your regular medicines. This is especially important if you are taking diabetes medicines or blood thinners.  Follow instructions from your health care provider about eating or drinking restrictions.  Remove your jewelry on the day of the procedure. What happens during the procedure?  An IV will be inserted into one of your veins.  Your health care provider will inject a small amount of radioactive tracer through the IV.  You will wait for 20-40 minutes while the tracer travels through your bloodstream.  Your heart activity will be monitored with an electrocardiogram (ECG).  You will lie down on an exam table.  Images of your heart will be taken for about 15-20 minutes.  You may also have a stress test. For this test, one of the following may be done: ? You will exercise on a treadmill or stationary bike. While you exercise, your heart's activity will be monitored with an ECG, and your  blood pressure will be checked. ? You will be given medicines that will increase blood flow to parts of your heart. This is done if you are unable to exercise.  When blood flow to your heart has peaked, a tracer will again be injected through the IV.  After 20-40 minutes, you will get back on the exam table and have more images taken of your heart.  Depending on the type of tracer used, scans may need to be repeated 3-4 hours later.  Your IV line will be removed when the procedure is over. The procedure may vary among health care providers and hospitals. What happens after the procedure?  Unless your health care provider tells you otherwise, you may return to your normal schedule, including diet, activities, and medicines.  Unless your health care provider tells you otherwise, you may increase your fluid intake. This will help to flush the contrast dye from your body. Drink enough fluid to keep your urine pale yellow.  Ask your health care provider, or the department that is doing the test: ? When will my results be ready? ?  How will I get my results? Summary  A cardiac nuclear scan measures the blood flow to the heart when a person is resting and when he or she is exercising.  Tell your health care provider if you are pregnant.  Before the procedure, ask your health care provider about changing or stopping your regular medicines. This is especially important if you are taking diabetes medicines or blood thinners.  After the procedure, unless your health care provider tells you otherwise, increase your fluid intake. This will help flush the contrast dye from your body.  After the procedure, unless your health care provider tells you otherwise, you may return to your normal schedule, including diet, activities, and medicines. This information is not intended to replace advice given to you by your health care provider. Make sure you discuss any questions you have with your health care  provider. Document Released: 06/16/2004 Document Revised: 11/05/2017 Document Reviewed: 11/05/2017 Elsevier Patient Education  2020 [employer].

## 2019-03-06 NOTE — Progress Notes (Signed)
Cardiology Office Note:    Date:  03/06/2019   ID:  RAFEEF LAU, DOB 09-12-1940, MRN [id number]  PCP:  Delsa Grana, PA-C  Cardiologist:  Kate Sable, MD  Electrophysiologist:  None   Referring MD: Delsa Grana, PA-C   Chief Complaint  Patient presents with  . New Patient (Initial Visit)    Atrial Fibrillation    History of Present Illness:    Christine Burnett is a 78 y.o. female with a hx of paroxysmal A. fib, former smoker x50 years, hyperlipidemia, TIA who presents due to feeling dizzy upon standing.  Patient states symptoms of dizziness have been going on for a long time and usually occur when she stands up from a sitting position.  She also has the feeling of she is going to pass out.  She denies palpitations or shortness of breath when standing.  She adamantly denies any history of atrial fibrillation and states she takes warfarin due to history of carotid artery stenosis.  She states having rare palpitations with last occurrence about 1 to 2 months ago.  Warfarin was started years ago by a physician in [place], prior to her having any TIA symptoms.  She also endorses dyspnea on exertion, states walking fast or going up stairs causes her to be short of breath and she has to rest.  This has been going on for years now and she attributes this to her age or her smoking history..  Past Medical History:  Diagnosis Date  . A-fib ([place])   . Allergic rhinitis   . Anxiety   . Chronic bronchitis ([place])   . Chronic kidney disease, stage III (moderate) 08/31/2017   Seeing nephrologist  . CKD (chronic kidney disease), stage III   . Cornea scar   . Depression    unspecified  . Diverticulosis   . Hx of fracture of left hip 12/24/2017  . Hyperlipidemia   . Hyperparathyroidism, secondary renal ([place]) 08/31/2017   Managed by nephrologist  . Hypertension   . Osteoporosis   . Risk for falls   . Situational disturbance   . TIA (transient ischemic attack)     Past Surgical  History:  Procedure Laterality Date  . COLONOSCOPY  2016  . ESOPHAGUS SURGERY    . INTRAMEDULLARY (IM) NAIL INTERTROCHANTERIC Left 03/26/2017   Procedure: INTRAMEDULLARY (IM) NAIL INTERTROCHANTRIC;  Surgeon: Hessie Knows, MD;  Location: [facility] ORS;  Service: Orthopedics;  Laterality: Left;    Current Medications: Current Meds  Medication Sig  . acetaminophen (TYLENOL) 325 MG tablet Take 1 tablet (325 mg total) by mouth at bedtime as needed.  Marland Kitchen albuterol (VENTOLIN HFA) 108 (90 Base) MCG/ACT inhaler Inhale 2 puffs into the lungs every 6 (six) hours as needed for wheezing or shortness of breath.  Marland Kitchen atorvastatin (LIPITOR) 10 MG tablet Take 1 tablet (10 mg total) by mouth at bedtime.  . Cholecalciferol (VITAMIN D-3) 1000 units CAPS Take 2 capsules by mouth daily.   . Cyanocobalamin (VITAMIN B-12) 500 MCG SUBL One tablet dissolved under the tongue once a day  . ezetimibe (ZETIA) 10 MG tablet Take 1 tablet (10 mg total) by mouth daily.  . hydrOXYzine (ATARAX/VISTARIL) 10 MG tablet Take 1-2 tablets (10-20 mg total) by mouth every 12 (twelve) hours as needed for anxiety. (Patient taking differently: Take 10-20 mg by mouth every 12 (twelve) hours as needed for anxiety. Takes very rarely)  . PARoxetine (PAXIL) 40 MG tablet Take 1 tablet (40 mg total) by mouth every morning.  Marland Kitchen  warfarin (COUMADIN) 2 MG tablet TAKE 3 MG ON  MONDAYS AND THURSDAYS, AND 2 MG DAILY ALL OTHER DAYS OR AS DIRCTED BY YOUR DOCTOR MOST RECENTLY     Allergies:   Cefuroxime axetil, Ciprofloxacin, and Codeine   Social History   Socioeconomic History  . Marital status: Divorced    Spouse name: Not on file  . Number of children: 4  . Years of education: Not on file  . Highest education level: 12th grade  Occupational History  . Occupation: Retired  Scientific laboratory technician  . Financial resource strain: Not hard at all  . Food insecurity    Worry: Never true    Inability: Never true  . Transportation needs    Medical: No     Non-medical: No  Tobacco Use  . Smoking status: Former Smoker    Packs/day: 0.25    Years: 61.00    Pack years: 15.25    Types: Cigarettes    Quit date: 03/25/2017    Years since quitting: 1.9  . Smokeless tobacco: Never Used  . Tobacco comment: smoking cessation materials not required  Substance and Sexual Activity  . Alcohol use: No  . Drug use: No  . Sexual activity: Never  Lifestyle  . Physical activity    Days per week: 0 days    Minutes per session: 0 min  . Stress: Only a little  Relationships  . Social connections    Talks on phone: More than three times a week    Gets together: More than three times a week    Attends religious service: More than 4 times per year    Active member of club or organization: No    Attends meetings of clubs or organizations: Never    Relationship status: Divorced  Other Topics Concern  . Not on file  Social History Narrative   Full Code   Current smoker   4 children   Denies alcohol use     Family History: The patient's family history includes Diabetes in her brother; Heart attack in her brother and father; Hypertension in her daughter; Other in her mother; Prostate cancer in her father. There is no history of Kidney cancer, Bladder Cancer, Colon cancer, Colon polyps, or Rectal cancer.  ROS:   Please see the history of present illness.     All other systems reviewed and are negative.  EKGs/Labs/Other Studies Reviewed:    The following studies were reviewed today: Carotid ultrasound dated 07/29/2018 IMPRESSION: Minor carotid atherosclerosis. No hemodynamically significant ICA stenosis by ultrasound. Degree of narrowing less than 50% bilaterally.  Patent antegrade vertebral flow bilaterally    Echo dated 03/27/2017 Study Conclusions  - Left ventricle: The cavity size was normal. Wall thickness was   increased in a pattern of mild LVH. Systolic function was   vigorous. The estimated ejection fraction was in the range of  65%   to 70%. Wall motion was normal; there were no regional wall   motion abnormalities. Doppler parameters are consistent with   abnormal left ventricular relaxation (grade 1 diastolic   dysfunction). - Right ventricle: The cavity size was normal. Systolic function   was normal. - Pulmonary arteries: Systolic pressure was mildly increased, in   the range of 35 mm Hg to 40 mm Hg.  EKG:  EKG is  ordered today.  The ekg ordered today demonstrates normal sinus rhythm, normal ECG.  Recent Labs: 08/13/2018: TSH 2.53 01/21/2019: ALT 14; BUN 23; Creat 1.48; Hemoglobin 12.4; Platelets 209;  Potassium 4.7; Sodium 139  Recent Lipid Panel    Component Value Date/Time   CHOL 115 11/12/2018 1609   CHOL 191 11/10/2015 1211   CHOL 144 07/03/2014 0526   TRIG 171 (H) 11/12/2018 1609   TRIG 320 (H) 07/03/2014 0526   HDL 37 (L) 11/12/2018 1609   HDL 46 11/10/2015 1211   HDL 33 (L) 07/03/2014 0526   CHOLHDL 3.1 11/12/2018 1609   VLDL 37 (H) 11/10/2016 1531   VLDL 64 (H) 07/03/2014 0526   LDLCALC 53 11/12/2018 1609   LDLCALC 47 07/03/2014 0526    Physical Exam:    VS:  BP 130/80 (BP Location: Right Arm, Patient Position: Sitting, Cuff Size: Normal)   Pulse 86   Temp (!) 96.4 F (35.8 C)   Ht 5\' 7"  (1.702 m)   Wt 155 lb (70.3 kg)   SpO2 96%   BMI 24.28 kg/m     Wt Readings from Last 3 Encounters:  03/06/19 155 lb (70.3 kg)  02/18/19 150 lb 14.4 oz (68.4 kg)  01/28/19 153 lb (69.4 kg)     GEN:  Well nourished, well developed in no acute distress HEENT: Normal NECK: No JVD; No carotid bruits LYMPHATICS: No lymphadenopathy CARDIAC: RRR, no murmurs, rubs, gallops RESPIRATORY:  Clear to auscultation without rales, wheezing or rhonchi  ABDOMEN: Soft, non-tender, non-distended MUSCULOSKELETAL:  No edema; No deformity  SKIN: Warm and dry NEUROLOGIC:  Alert and oriented x 3 PSYCHIATRIC:  Normal affect   ASSESSMENT:   Patient has an anginal equivalent with dyspnea on exertion.  Patient  has risk factors of hyperlipidemia, long smoking history, family history of MI.  Her symptoms may well be secondary to lung disease although CAD is possible.  We will get a Myoview.  Her last echocardiogram back in 2018 had normal EF with impaired relaxation and normal filling pressures.  Her dizziness typically happens when she rises up from seated position could be age-related deterioration of peripheral vestibular function. Her orthostatic vitals in the office did not reveal any evidence of orthostasis.  It is not clear if patient has a diagnosis of A. fib although she states having rare occurrence of palpitation and her history of TIA's.  It is reasonable to continue warfarin at current dose for stroke risk prevention.  Her chads vasc score will be at least 5(age, TIA, female)     1. DOE (dyspnea on exertion)   2. Dizziness   3. Pure hypercholesterolemia   4. Paroxysmal atrial fibrillation (HCC)    PLAN:    In order of problems listed above:  1. Anginal equivalent.  Get Lexiscan myocardial perfusion imaging.  If test is normal, then patient will be referred to pulmonary medicine for PFTs  2.  Negative orthostasis.  ECG normal sinus rhythm.  Patient counseled on standing up slowly from seated position rather than rapidly.  3.  Continue Lipitor  4.    Continue warfarin at current dose due to her history of TIAs.   Medication Adjustments/Labs and Tests Ordered: Current medicines are reviewed at length with the patient today.  Concerns regarding medicines are outlined above.  Orders Placed This Encounter  Procedures  . NM Myocar Multi W/Spect W/Wall Motion / EF  . EKG 12-Lead   No orders of the defined types were placed in this encounter.   Patient Instructions  Medication Instructions:  - Your physician recommends that you continue on your current medications as directed. Please refer to the Current Medication list given to you  today.  If you need a refill on your cardiac  medications before your next appointment, please call your pharmacy.   Lab work: - none ordered  If you have labs (blood work) drawn today and your tests are completely normal, you will receive your results only by: Marland Kitchen MyChart Message (if you have MyChart) OR . A paper copy in the mail If you have any lab test that is abnormal or we need to change your treatment, we will call you to review the results.  Testing/Procedures: - Your physician has requested that you have a lexiscan myoview.  [place]  Your caregiver has ordered a Stress Test with nuclear imaging. The purpose of this test is to evaluate the blood supply to your heart muscle. This procedure is referred to as a "Non-Invasive Stress Test." This is because other than having an IV started in your vein, nothing is inserted or "invades" your body. Cardiac stress tests are done to find areas of poor blood flow to the heart by determining the extent of coronary artery disease (CAD). Some patients exercise on a treadmill, which naturally increases the blood flow to your heart, while others who are  unable to walk on a treadmill due to physical limitations have a pharmacologic/chemical stress agent called Lexiscan . This medicine will mimic walking on a treadmill by temporarily increasing your coronary blood flow.   Please note: these test may take anywhere between 2-4 hours to complete  PLEASE REPORT TO [place] AT THE FIRST DESK WILL DIRECT YOU WHERE TO GO  Date of Procedure:_____________________________________  Arrival Time for Procedure:______________________________  Instructions regarding medication:   __x__ : You may take all of your regular medications with enough water to get them down safely the morning of your test.  PLEASE NOTIFY THE OFFICE AT LEAST 24 HOURS IN ADVANCE IF YOU ARE UNABLE TO [place].  [phone] AND  PLEASE NOTIFY NUCLEAR MEDICINE AT [facility] AT LEAST 24  HOURS IN ADVANCE IF YOU ARE UNABLE TO KEEP YOUR APPOINTMENT. [phone]  How to prepare for your Myoview test:  1. Do not eat or drink after midnight 2. No caffeine for 24 hours prior to test 3. No smoking 24 hours prior to test. 4. Your medication may be taken with water.  If your doctor stopped a medication because of this test, do not take that medication. 5. Ladies, please do not wear dresses.  Skirts or pants are appropriate. Please wear a short sleeve shirt. 6. No perfume, cologne or lotion. 7. Wear comfortable walking shoes. No heels!   Follow-Up: At [facility], you and your health needs are our priority.  As part of our continuing mission to provide you with exceptional heart care, we have created designated Provider Care Teams.  These Care Teams include your primary Cardiologist (physician) and Advanced Practice Providers (APPs -  Physician Assistants and Nurse Practitioners) who all work together to provide you with the care you need, when you need it. Marland Kitchen after you stress test  Any Other Special Instructions Will Be Listed Below (If Applicable). - N/A   Cardiac Nuclear Scan A cardiac nuclear scan is a test that measures blood flow to the heart when a person is resting and when he or she is exercising. The test looks for problems such as:  Not enough blood reaching a portion of the heart.  The heart muscle not working normally. You may need this test if:  You have heart disease.  You have had abnormal lab results.  You have had heart surgery or a balloon procedure to open up blocked arteries (angioplasty).  You have chest pain.  You have shortness of breath. In this test, a radioactive dye (tracer) is injected into your bloodstream. After the tracer has traveled to your heart, an imaging device is used to measure how much of the tracer is absorbed by or distributed to various areas of your heart. This procedure is usually done at a hospital and takes 2-4 hours.  Tell a health care provider about:  Any allergies you have.  All medicines you are taking, including vitamins, herbs, eye drops, creams, and over-the-counter medicines.  Any problems you or family members have had with anesthetic medicines.  Any blood disorders you have.  Any surgeries you have had.  Any medical conditions you have.  Whether you are pregnant or may be pregnant. What are the risks? Generally, this is a safe procedure. However, problems may occur, including:  Serious chest pain and heart attack. This is only a risk if the stress portion of the test is done.  Rapid heartbeat.  Sensation of warmth in your chest. This usually passes quickly.  Allergic reaction to the tracer. What happens before the procedure?  Ask your health care provider about changing or stopping your regular medicines. This is especially important if you are taking diabetes medicines or blood thinners.  Follow instructions from your health care provider about eating or drinking restrictions.  Remove your jewelry on the day of the procedure. What happens during the procedure?  An IV will be inserted into one of your veins.  Your health care provider will inject a small amount of radioactive tracer through the IV.  You will wait for 20-40 minutes while the tracer travels through your bloodstream.  Your heart activity will be monitored with an electrocardiogram (ECG).  You will lie down on an exam table.  Images of your heart will be taken for about 15-20 minutes.  You may also have a stress test. For this test, one of the following may be done: ? You will exercise on a treadmill or stationary bike. While you exercise, your heart's activity will be monitored with an ECG, and your blood pressure will be checked. ? You will be given medicines that will increase blood flow to parts of your heart. This is done if you are unable to exercise.  When blood flow to your heart has peaked, a  tracer will again be injected through the IV.  After 20-40 minutes, you will get back on the exam table and have more images taken of your heart.  Depending on the type of tracer used, scans may need to be repeated 3-4 hours later.  Your IV line will be removed when the procedure is over. The procedure may vary among health care providers and hospitals. What happens after the procedure?  Unless your health care provider tells you otherwise, you may return to your normal schedule, including diet, activities, and medicines.  Unless your health care provider tells you otherwise, you may increase your fluid intake. This will help to flush the contrast dye from your body. Drink enough fluid to keep your urine pale yellow.  Ask your health care provider, or the department that is doing the test: ? When will my results be ready? ? How will I get my results? Summary  A cardiac nuclear scan measures the blood flow to the heart when a person is resting and  when he or she is exercising.  Tell your health care provider if you are pregnant.  Before the procedure, ask your health care provider about changing or stopping your regular medicines. This is especially important if you are taking diabetes medicines or blood thinners.  After the procedure, unless your health care provider tells you otherwise, increase your fluid intake. This will help flush the contrast dye from your body.  After the procedure, unless your health care provider tells you otherwise, you may return to your normal schedule, including diet, activities, and medicines. This information is not intended to replace advice given to you by your health care provider. Make sure you discuss any questions you have with your health care provider. Document Released: 06/16/2004 Document Revised: 11/05/2017 Document Reviewed: 11/05/2017 Elsevier Patient Education  2020 [place], Kate Sable, MD  03/06/2019 12:49 PM     [place]

## 2019-03-12 ENCOUNTER — Telehealth: Payer: Self-pay | Admitting: Family Medicine

## 2019-03-12 DIAGNOSIS — G459 Transient cerebral ischemic attack, unspecified: Secondary | ICD-10-CM

## 2019-03-12 DIAGNOSIS — Z5181 Encounter for therapeutic drug level monitoring: Secondary | ICD-10-CM

## 2019-03-12 NOTE — Telephone Encounter (Signed)
Pt needs INR OV or to f/up with cardiology prior to refill.   Hopefully someone saw her for her dizziness too

## 2019-03-12 NOTE — Telephone Encounter (Signed)
Pt was not sure why her script was denied since she had an appt already scheduled on 03/20/2019. She did however agree to come in a few days early on 03/17/2019.

## 2019-03-17 ENCOUNTER — Encounter: Payer: Self-pay | Admitting: Family Medicine

## 2019-03-17 ENCOUNTER — Other Ambulatory Visit: Payer: Self-pay

## 2019-03-17 ENCOUNTER — Ambulatory Visit (INDEPENDENT_AMBULATORY_CARE_PROVIDER_SITE_OTHER): Payer: Medicare Other | Admitting: Family Medicine

## 2019-03-17 VITALS — BP 140/78 | HR 102 | Temp 98.7°F | Resp 14 | Ht 67.0 in | Wt 154.0 lb

## 2019-03-17 DIAGNOSIS — Z7901 Long term (current) use of anticoagulants: Secondary | ICD-10-CM

## 2019-03-17 DIAGNOSIS — R2689 Other abnormalities of gait and mobility: Secondary | ICD-10-CM

## 2019-03-17 DIAGNOSIS — R011 Cardiac murmur, unspecified: Secondary | ICD-10-CM

## 2019-03-17 DIAGNOSIS — R42 Dizziness and giddiness: Secondary | ICD-10-CM

## 2019-03-17 DIAGNOSIS — G459 Transient cerebral ischemic attack, unspecified: Secondary | ICD-10-CM

## 2019-03-17 DIAGNOSIS — Z5181 Encounter for therapeutic drug level monitoring: Secondary | ICD-10-CM | POA: Diagnosis not present

## 2019-03-17 DIAGNOSIS — I480 Paroxysmal atrial fibrillation: Secondary | ICD-10-CM

## 2019-03-17 DIAGNOSIS — N1832 Chronic kidney disease, stage 3b: Secondary | ICD-10-CM

## 2019-03-17 LAB — POCT INR: INR: 1.9 — AB (ref 2.0–3.0)

## 2019-03-17 MED ORDER — WARFARIN SODIUM 2 MG PO TABS: ORAL_TABLET | ORAL | 0 refills | Status: DC

## 2019-03-17 NOTE — Patient Instructions (Addendum)
Please keep your appointment with Dr. Melrose Nakayama  I will try to discuss your coumadin with your cardiologist, neurologist and nephrologist to get a better medication option.

## 2019-03-17 NOTE — Progress Notes (Signed)
Patient ID: Christine Burnett, female    DOB: Aug 17, 1940, 78 y.o.   MRN: [id number]  PCP: Delsa Grana, PA-C  Chief Complaint  Patient presents with  . Anticoagulation    INR:1.9  has been out over the last 3 days, and has also been taking probiotic  . Medication Refill    Subjective:   Christine Burnett is a 78 y.o. female, presents to clinic with CC of the following:  HPI  Patient is here for med refill and for PT/INR check.  New to me as of one month ago - She was seen by me a little over a month ago for same complaint and had recently complained of dizziness to me" at her last visit.  I referred her to neurology and to cardiology for management the PT/INR, A. fib, hx of TIA, persistent dizziness.    Patient has since had a consult with cardiology and is scheduled for follow-up testing.  He expressed that she should be on a different medication other than Coumadin.  She does have a neurology appointment scheduled with Dr. Melrose Nakayama on the 20th.  Encouraged her to keep that appointment to continue to assess her balance and lightheaded episodes.  She continues to have loss of balance difficulty with her gait with positional changes but is slightly improved from the last 2 or 3 office visits here.  She does have a history of TIA, I believe per chart review this is why she was first put on Coumadin.  Would like to have her consult with neurology regarding her symptoms, Coumadin, past TIA.  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    Med management of Coumadin: PT/INR today. Results for orders placed or performed in visit on 03/17/19  POCT INR  Result Value Ref Range   INR 1.9 (A) 2.0 - 3.0  Has been out for 3 days, and has also been taking pro-biotic.   No other new meds.  Has CKD stage IIIb last GFR in chart was 55- sees Dr. Holley Raring - sees next month -I have encouraged her to talk to Dr. Holley Raring about medications that she could safely take     Patient Active Problem List   Diagnosis Date Noted  . Prediabetes 08/13/2018  . Major depressive disorder, recurrent ([place]) 06/13/2018  . Allergic rhinitis 09/27/2017  . Compulsive tobacco user syndrome 09/27/2017  . 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 Chronic kidney disease, stage III (moderate) 08/31/2017  . Hyperparathyroidism, secondary renal ([place]) 08/31/2017  . Atrial fibrillation ([place]) 08/09/2017  . 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 Hyperglycemia 11/10/2015  . Cardiac murmur 04/07/2015  . 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 Long term current use of anticoagulant therapy 02/10/2015  . Anxiety 01/05/2015  . Hyperlipidemia 01/05/2015  . Anticoagulation goal of INR 2 to 3 11/16/2014  . HTN (hypertension) 09/24/2014      Current Outpatient Medications:  .  acetaminophen (TYLENOL) 325 MG tablet, Take 1 tablet (325 mg total) by mouth at bedtime as needed., Disp: , Rfl:  .  albuterol (VENTOLIN HFA) 108 (90 Base) MCG/ACT inhaler, Inhale 2 puffs into the lungs every 6 (six) hours as needed for wheezing or shortness of breath., Disp: 1 Inhaler, Rfl: 2 .  atorvastatin (LIPITOR) 10 MG tablet, Take 1 tablet (10 mg total) by mouth at bedtime., Disp: 90  tablet, Rfl: 1 .  Cholecalciferol (VITAMIN D-3) 1000 units CAPS, Take 2 capsules by mouth daily. , Disp: , Rfl:  .  Cyanocobalamin (VITAMIN B-12) 500 MCG SUBL, One tablet dissolved under the tongue once a day, Disp: 100 tablet, Rfl: 3 .  ezetimibe (ZETIA) 10 MG tablet, Take 1 tablet (10 mg total) by mouth daily., Disp: 30 tablet, Rfl: 11 .  hydrOXYzine (ATARAX/VISTARIL) 10 MG tablet, Take 1-2 tablets (10-20 mg total) by mouth every 12 (twelve) hours as needed for anxiety. (Patient taking differently: Take 10-20 mg by mouth every 12 (twelve) hours as needed for anxiety. Takes very  rarely), Disp: 30 tablet, Rfl: 1 .  PARoxetine (PAXIL) 40 MG tablet, Take 1 tablet (40 mg total) by mouth every morning., Disp: 90 tablet, Rfl: 1 .  warfarin (COUMADIN) 2 MG tablet, TAKE 3 MG ON  MONDAYS AND THURSDAYS, AND 2 MG DAILY ALL OTHER DAYS OR AS DIRCTED BY YOUR DOCTOR MOST RECENTLY, Disp: 36 tablet, Rfl: 0   Allergies  Allergen Reactions  . Cefuroxime Axetil     caused rash.  . Ciprofloxacin     Unknown  . Codeine      Family History  Problem Relation Age of Onset  . Other Mother        [place] posioning  . Heart attack Father   . Prostate cancer Father   . Heart attack Brother   . Diabetes Brother   . Hypertension Daughter   . Kidney cancer Neg Hx   . Bladder Cancer Neg Hx   . Colon cancer Neg Hx   . Colon polyps Neg Hx   . Rectal cancer Neg Hx      Social History   Socioeconomic History  . Marital status: Divorced    Spouse name: Not on file  . Number of children: 4  . Years of education: Not on file  . Highest education level: 12th grade  Occupational History  . Occupation: Retired  Scientific laboratory technician  . Financial resource strain: Not hard at all  . Food insecurity    Worry: Never true    Inability: Never true  . Transportation needs    Medical: No    Non-medical: No  Tobacco Use  . Smoking status: Former Smoker    Packs/day: 0.25    Years: 61.00    Pack years: 15.25    Types: Cigarettes    Quit date: 03/25/2017    Years since quitting: 1.9  . Smokeless tobacco: Never Used  . Tobacco comment: smoking cessation materials not required  Substance and Sexual Activity  . Alcohol use: No  . Drug use: No  . Sexual activity: Never  Lifestyle  . Physical activity    Days per week: 0 days    Minutes per session: 0 min  . Stress: Only a little  Relationships  . Social connections    Talks on phone: More than three times a week    Gets together: More than three times a week    Attends religious service: More than 4 times per year    Active member of  club or organization: No    Attends meetings of clubs or organizations: Never    Relationship status: Divorced  . Intimate partner violence    Fear of current or ex partner: No    Emotionally abused: No    Physically abused: No    Forced sexual activity: No  Other Topics Concern  . Not on file  Social History Narrative  Full Code   Current smoker   4 children   Denies alcohol use    I personally reviewed active problem list, medication list, allergies, family history, social history, health maintenance, notes from last encounter, lab results with the patient/caregiver today.  Review of Systems  Constitutional: Negative.   HENT: Negative.   Eyes: Negative.   Respiratory: Negative.   Cardiovascular: Negative.   Gastrointestinal: Negative.   Endocrine: Negative.   Genitourinary: Negative.   Musculoskeletal: Negative.   Skin: Negative.   Allergic/Immunologic: Negative.   Neurological: Negative.   Hematological: Negative.   Psychiatric/Behavioral: Negative.   All other systems reviewed and are negative.      Objective:   Vitals:   03/17/19 1448  BP: 140/78  Pulse: (!) 102  Resp: 14  Temp: 98.7 F (37.1 C)  SpO2: 96%  Weight: 154 lb (69.9 kg)  Height: 5\' 7"  (1.702 m)    Body mass index is 24.12 kg/m.  Physical Exam Vitals signs and nursing note reviewed.  Constitutional:      General: She is not in acute distress.    Appearance: Normal appearance. She is well-developed. She is not ill-appearing, toxic-appearing or diaphoretic.     Interventions: Face mask in place.  HENT:     Head: Normocephalic and atraumatic.     Right Ear: External ear normal.     Left Ear: External ear normal.  Eyes:     General: Lids are normal. No scleral icterus.       Right eye: No discharge.        Left eye: No discharge.     Conjunctiva/sclera: Conjunctivae normal.  Neck:     Musculoskeletal: Normal range of motion and neck supple.     Trachea: Phonation normal. No tracheal  deviation.  Cardiovascular:     Rate and Rhythm: Normal rate and regular rhythm.     Pulses: Normal pulses.          Radial pulses are 2+ on the right side and 2+ on the left side.       Posterior tibial pulses are 2+ on the right side and 2+ on the left side.     Heart sounds: Normal heart sounds. No murmur. No friction rub. No gallop.      Comments: HR rechecked in room at time of exam, RRR HR 85 Pulmonary:     Effort: Pulmonary effort is normal. No respiratory distress.     Breath sounds: Normal breath sounds. No stridor. No wheezing, rhonchi or rales.  Chest:     Chest wall: No tenderness.  Abdominal:     General: Bowel sounds are normal. There is no distension.     Palpations: Abdomen is soft.     Tenderness: There is no abdominal tenderness. There is no guarding or rebound.  Musculoskeletal: Normal range of motion.        General: No deformity.     Right lower leg: No edema.     Left lower leg: No edema.  Lymphadenopathy:     Cervical: No cervical adenopathy.  Skin:    General: Skin is warm and dry.     Capillary Refill: Capillary refill takes less than 2 seconds.     Coloration: Skin is not jaundiced or pale.     Findings: No rash.  Neurological:     Mental Status: She is alert.     Motor: No abnormal muscle tone.  Psychiatric:        Mood and Affect:  Mood normal.        Speech: Speech normal.        Behavior: Behavior normal.      Results for orders placed or performed in visit on 03/17/19  POCT INR  Result Value Ref Range   INR 1.9 (A) 2.0 - 3.0        Assessment & Plan:   Patient here for PT/INR check has been on medicines for a few days, also recently has had difficulty with her balance particularly after positional changes, was referred to cardiology and neurology for consult since patient is new to me and medical history and her current medications seem slightly confusing and suboptimal given her age. Her dizziness balance issues symptoms for the past  several months have continued but are slightly improving and she does have a neurology appointment scheduled this month.  Have refilled her Coumadin with same dosing, she is near goal range and has no bleeding concerns at this time, has had no falls    ICD-10-CM   1. Anticoagulation goal of INR 2 to 3  Z51.81 POCT INR   Z79.01 warfarin (COUMADIN) 2 MG tablet   Advised patient is to see specialist and should switch to NOAC  2. Paroxysmal atrial fibrillation (HCC)  I48.0    referred to cardiology for consult  3. Stage 3b chronic kidney disease  N18.32    per Dr. Holley Raring  4. Cardiac murmur  R01.1    to cardiology  5. TIA (transient ischemic attack)  G45.9 warfarin (COUMADIN) 2 MG tablet   needs consult by neuro  6. Lightheaded  R42    referred to neuro  7. Impairment of balance  R26.89    referred to neuro  8. Anticoagulation goal of INR 2 to 3  Z51.81 POCT INR   Z79.01 warfarin (COUMADIN) 2 MG tablet    Will have her back next month -at that time hope to see we can change her management with discontinuing Coumadin after consulting with cardiology, neurology and her nephrologist   Delsa Grana, PA-C 03/17/19 2:56 PM

## 2019-03-20 ENCOUNTER — Ambulatory Visit: Payer: Medicare Other | Admitting: Family Medicine

## 2019-03-25 DIAGNOSIS — R270 Ataxia, unspecified: Secondary | ICD-10-CM | POA: Diagnosis not present

## 2019-03-31 ENCOUNTER — Other Ambulatory Visit: Payer: Self-pay

## 2019-03-31 ENCOUNTER — Encounter
Admission: RE | Admit: 2019-03-31 | Discharge: 2019-03-31 | Disposition: A | Discharge status: Home / Self Care | Payer: Medicare Other | Source: Ambulatory Visit | Attending: Cardiology | Admitting: Cardiology

## 2019-03-31 DIAGNOSIS — R0600 Dyspnea, unspecified: Secondary | ICD-10-CM | POA: Diagnosis not present

## 2019-03-31 DIAGNOSIS — R0609 Other forms of dyspnea: Secondary | ICD-10-CM

## 2019-03-31 LAB — NM MYOCAR MULTI W/SPECT W/WALL MOTION / EF
Estimated workload: 1 METS
Exercise duration (min): 0 min
Exercise duration (sec): 0 s
LV dias vol: 51 mL (ref 46–106)
LV sys vol: 13 mL
MPHR: 141 {beats}/min
Peak HR: 104 {beats}/min
Percent HR: 73 %
Rest HR: 76 {beats}/min
SDS: 0
SRS: 1
SSS: 0
TID: 0.67

## 2019-03-31 MED ORDER — TECHNETIUM TC 99M TETROFOSMIN IV KIT
10.3000 | PACK | Freq: Once | INTRAVENOUS | Status: AC | PRN
  Administered 2019-03-31: 10.3 via INTRAVENOUS

## 2019-03-31 MED ORDER — TECHNETIUM TC 99M TETROFOSMIN IV KIT
30.0000 | PACK | Freq: Once | INTRAVENOUS | Status: AC | PRN
  Administered 2019-03-31: 11:00:00 30.371 via INTRAVENOUS

## 2019-03-31 MED ORDER — REGADENOSON 0.4 MG/5ML IV SOLN
0.4000 mg | Freq: Once | INTRAVENOUS | Status: AC
  Administered 2019-03-31: 11:00:00 0.4 mg via INTRAVENOUS

## 2019-04-01 ENCOUNTER — Telehealth: Payer: Self-pay

## 2019-04-01 NOTE — Telephone Encounter (Signed)
-----   Message from Kate Sable, MD sent at 04/01/2019  8:18 AM EDT ----- Normal test

## 2019-04-01 NOTE — Telephone Encounter (Signed)
Patient made aware of stress test results with verbalized understanding.

## 2019-04-02 ENCOUNTER — Telehealth: Payer: Self-pay | Admitting: Cardiology

## 2019-04-02 NOTE — Telephone Encounter (Signed)
It seems unlikely that patient would still have residual symptoms from regadenoson on Monday.  I encourage her to drink plenty of fluids.  Taking in a bit of extra caffeine can be helpful.  If symptoms persist, she should speak with her PCP.  Nelva Bush, MD [facility] Pager: [phone]

## 2019-04-02 NOTE — Telephone Encounter (Signed)
STAT if patient feels like he/she is going to faint   1) Are you dizzy now? yes  2) Do you feel faint or have you passed out? No, but very dizzy   3) Do you have any other symptoms? No, drank a lot of water so does not believe she is dehydrated   Have you checked your HR and BP (record if available)? No  Patient has been very dizzy since Monday.  Says on a scale from 1 to 10 yesterday it was a 7 and today a 4.  Please call to discuss.

## 2019-04-02 NOTE — Telephone Encounter (Signed)
Pt reports that she had myoview on Monday. Since she got home she has been feeling dizziness with eye movement and dull headache. She reports moving slowing when standing and sx improve with walking.   She denies cxp, SOB or LE swelling. No distress/issues other than dizziness.   She has f/u with PCP on Tuesday. She is not currently driving at this time.   Pt does not have access at home to take vital signs.   Routing to DOD to advise. Made RN Anderson Malta aware.

## 2019-04-02 NOTE — Telephone Encounter (Signed)
Call to patient to discuss sx. No answer, lmtcb.

## 2019-04-03 NOTE — Telephone Encounter (Signed)
Call to patient with advice from Dr. Saunders Revel.   She reported feeling improvement this morning, "getting better every day".   She agrees to more fluid intake and has f/u with PCP Tuesday.   Pt will call if she has further questions or concerns.

## 2019-04-07 ENCOUNTER — Other Ambulatory Visit: Payer: Self-pay

## 2019-04-07 ENCOUNTER — Ambulatory Visit (INDEPENDENT_AMBULATORY_CARE_PROVIDER_SITE_OTHER): Payer: Medicare Other

## 2019-04-07 ENCOUNTER — Ambulatory Visit (INDEPENDENT_AMBULATORY_CARE_PROVIDER_SITE_OTHER): Payer: Medicare Other | Admitting: Cardiology

## 2019-04-07 ENCOUNTER — Encounter: Payer: Self-pay | Admitting: Cardiology

## 2019-04-07 ENCOUNTER — Telehealth: Payer: Self-pay | Admitting: Cardiology

## 2019-04-07 VITALS — BP 124/68 | HR 79 | Temp 97.0°F | Ht 67.0 in | Wt 154.8 lb

## 2019-04-07 DIAGNOSIS — R002 Palpitations: Secondary | ICD-10-CM

## 2019-04-07 DIAGNOSIS — R0600 Dyspnea, unspecified: Secondary | ICD-10-CM | POA: Diagnosis not present

## 2019-04-07 DIAGNOSIS — R42 Dizziness and giddiness: Secondary | ICD-10-CM

## 2019-04-07 DIAGNOSIS — R0609 Other forms of dyspnea: Secondary | ICD-10-CM

## 2019-04-07 NOTE — Progress Notes (Signed)
Cardiology Office Note:    Date:  04/07/2019   ID:  Christine Burnett, DOB 1940-08-21, MRN [id number]  PCP:  Delsa Grana, PA-C  Cardiologist:  Kate Sable, MD  Electrophysiologist:  None   Referring MD: Delsa Grana, PA-C   Chief Complaint  Patient presents with   other    Follow up from [place] text. Meds reviewed by the pt. verbally. Pt. c/o dizziness.     History of Present Illness:    Christine Burnett is a 78 y.o. female, history of anxiety, former smoker x50 years, hyperlipidemia, TIA who presents for follow-up.  She was originally seen due to feeling dizzy and also dyspnea on exertion.  Lexiscan myocardial perfusion imaging stress test was ordered.  She presents for results.  Patien denies any history of atrial fibrillation and states she takes warfarin due to history of carotid artery stenosis.  She states having rare palpitations with last occurrence about 1 to 2 months ago.  Warfarin was started years ago by a physician in [place], prior to her having any TIA symptoms.  Her neurologist stopped the warfarin and patient takes a baby aspirin.  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   albuterol (VENTOLIN HFA) 108 (90 Base) MCG/ACT inhaler Inhale 2 puffs into the lungs every 6 (six) hours as needed for wheezing or shortness of breath.   aspirin 81 MG EC tablet Take 81 mg by mouth daily. Swallow whole.   atorvastatin (LIPITOR) 10 MG tablet Take 1 tablet (10 mg total) by mouth at bedtime.   Cholecalciferol (VITAMIN D-3) 1000 units CAPS Take 2 capsules by mouth daily.    Cyanocobalamin (VITAMIN B-12) 500 MCG SUBL One tablet dissolved under the tongue once a day   ezetimibe (ZETIA) 10 MG tablet Take 1 tablet (10 mg total) by mouth daily.   hydrOXYzine (ATARAX/VISTARIL) 10 MG tablet Take 1-2 tablets (10-20 mg total) by mouth every 12 (twelve) hours as needed for anxiety. (Patient taking differently: Take 10-20 mg by mouth every 12 (twelve) hours as needed for anxiety. Takes very rarely)   PARoxetine (PAXIL) 40 MG tablet Take 1 tablet (40 mg total) by mouth every morning.     Allergies:   Cefuroxime axetil, Ciprofloxacin, and Codeine   Social History   Socioeconomic History   Marital status: Divorced    Spouse name: Not on file   Number of children: 4   Years of education:  Not on file   Highest education level: 12th grade  Occupational History   Occupation: Retired  Scientist, product/process development strain: Not hard at [employer]    Worry: Never true    Inability: Never true   Transportation needs    Medical: No    Non-medical: No  Tobacco Use   Smoking status: Former Smoker    Packs/day: 0.25    Years: 61.00    Pack years: 15.25    Types: Cigarettes    Quit date: 03/25/2017    Years since quitting: 2.0   Smokeless tobacco: Never Used   Tobacco comment: smoking cessation materials not required  Substance and Sexual Activity    Alcohol use: No   Drug use: No   Sexual activity: Never  Lifestyle   Physical activity    Days per week: 0 days    Minutes per session: 0 min   Stress: Only a little  Relationships   Social connections    Talks on phone: More than three times a week    Gets together: More than three times a week    Attends religious service: More than 4 times per year    Active member of club or organization: No    Attends meetings of clubs or organizations: Never    Relationship status: Divorced  Other Topics Concern   Not on file  Social History Narrative   Full Code   Current smoker   4 children   Denies alcohol use     Family History: The patient's family history includes Diabetes in her brother; Heart attack in her brother and father; Hypertension in her daughter; Other in her mother; Prostate cancer in her father. There is no history of Kidney cancer, Bladder Cancer, Colon cancer, Colon polyps, or Rectal cancer.  ROS:   Please see the history of present illness.     All other systems reviewed and are negative.  EKGs/Labs/Other Studies Reviewed:    The following studies were reviewed today:  Pharmacologic myocardial perfusion imaging date 03/31/2019  The study is normal.  This is a low risk study.  The left ventricular ejection fraction is hyperdynamic (>65%).    Carotid ultrasound dated 07/29/2018 IMPRESSION: Minor carotid atherosclerosis. No hemodynamically significant ICA stenosis by ultrasound. Degree of narrowing less than 50% bilaterally.  Patent antegrade vertebral flow bilaterally    Echo dated 03/27/2017 Study Conclusions  - Left ventricle: The cavity size was normal. Wall thickness was   increased in a pattern of mild LVH. Systolic function was   vigorous. The estimated ejection fraction was in the range of 65%   to 70%. Wall motion was normal; there were no regional wall   motion abnormalities. Doppler parameters are consistent with   abnormal left  ventricular relaxation (grade 1 diastolic   dysfunction). - Right ventricle: The cavity size was normal. Systolic function   was normal. - Pulmonary arteries: Systolic pressure was mildly increased, in   the range of 35 mm Hg to 40 mm Hg.  EKG:  EKG is  ordered today.  The ekg ordered today demonstrates normal sinus rhythm, normal ECG.  Recent Labs: 08/13/2018: TSH 2.53 01/21/2019: ALT 14; BUN 23; Creat 1.48; Hemoglobin 12.4; Platelets 209; Potassium 4.7; Sodium 139  Recent Lipid Panel    Component Value Date/Time   CHOL 115 11/12/2018 1609   CHOL 191 11/10/2015 1211   CHOL 144 07/03/2014 0526   TRIG 171 (H) 11/12/2018 1609  TRIG 320 (H) 07/03/2014 0526   HDL 37 (L) 11/12/2018 1609   HDL 46 11/10/2015 1211   HDL 33 (L) 07/03/2014 0526   CHOLHDL 3.1 11/12/2018 1609   VLDL 37 (H) 11/10/2016 1531   VLDL 64 (H) 07/03/2014 0526   LDLCALC 53 11/12/2018 1609   LDLCALC 47 07/03/2014 0526    Physical Exam:    VS:  BP 124/68 (BP Location: Left Arm, Patient Position: Sitting, Cuff Size: Normal)    Pulse 79    Temp (!) 97 F (36.1 C)    Ht 5\' 7"  (1.702 m)    Wt 154 lb 12 oz (70.2 kg)    BMI 24.24 kg/m     Wt Readings from Last 3 Encounters:  04/07/19 154 lb 12 oz (70.2 kg)  03/17/19 154 lb (69.9 kg)  03/06/19 155 lb (70.3 kg)     GEN:  Well nourished, well developed in no acute distress HEENT: Normal NECK: No JVD; No carotid bruits LYMPHATICS: No lymphadenopathy CARDIAC: RRR, no murmurs, rubs, gallops RESPIRATORY:  Clear to auscultation without rales, wheezing or rhonchi  ABDOMEN: Soft, non-tender, non-distended MUSCULOSKELETAL:  No edema; No deformity  SKIN: Warm and dry NEUROLOGIC:  Alert and oriented x 3 PSYCHIATRIC:  Normal affect   ASSESSMENT:   Patient complained of dyspnea on exertion.  Patient has risk factors of hyperlipidemia, long smoking history, family history of MI.  Her pharmacologic myocardial perfusion imaging stress test did not show any evidence for  ischemia.  Her last echocardiogram back in 2018 had normal EF with impaired relaxation and normal filling pressures.  Symptoms may be secondary to lung disease due to long history of smoking.  Her dizziness typically happens when she rises up from seated position could be age-related deterioration of peripheral vestibular function. Her orthostatic vitals in the office did not reveal any evidence of orthostasis.  It is not clear if patient has a diagnosis of A. fib although she states having rare occurrence of palpitation and her history of TIA's.   1. Palpitations   2. Dizziness   3. DOE (dyspnea on exertion)    PLAN:      1.  Dyspnea on exertion.  Lexiscan myocardial perfusion imaging is normal with normal ejection fraction.  I recommended patient see a pulmonologist for PFTs but patient states she does not want to see a pulmonologist.  She states self diagnosing herself with COPD.  She takes an albuterol as needed provided by her primary care provider.  2.  Patient denies history of atrial fibrillation, although diagnosis is in the chart and she has on and off palpitations.  Will get a Zio patch x2 weeks to check for A. fib.continue aspirin 81 mg daily due to history of TIAs.  Total encounter time more than 30  minutes  Greater than 50% was spent in counseling and coordination of care with the patient   This note was generated in part or whole with voice recognition software. Voice recognition is usually quite accurate but there are transcription errors that can and very often do occur. I apologize for any typographical errors that were not detected and corrected.   Medication Adjustments/Labs and Tests Ordered: Current medicines are reviewed at length with the patient today.  Concerns regarding medicines are outlined above.  Orders Placed This Encounter  Procedures   LONG TERM MONITOR (3-14 DAYS)   EKG 12-Lead   No orders of the defined types were placed in this  encounter.   Patient Instructions  Medication Instructions:  Your physician recommends that you continue on your current medications as directed. Please refer to the Current Medication list given to you today.  *If you need a refill on your cardiac medications before your next appointment, please call your pharmacy*  Lab Work: None ordered If you have labs (blood work) drawn today and your tests are completely normal, you will receive your results only by:  [place] (if you have MyChart) OR  A paper copy in the mail If you have any lab test that is abnormal or we need to change your treatment, we will call you to review the results.  Testing/Procedures: Your physician has recommended that you wear an zio monitor. Zio monitors are medical devices that record the hearts electrical activity. Doctors most often [place] these monitors to diagnose arrhythmias. Arrhythmias are problems with the speed or rhythm of the heartbeat. The monitor is a small, portable device. You can wear one while you do your normal daily activities. This is usually used to diagnose what is causing palpitations/syncope (passing out).    Follow-Up: At [facility], you and your health needs are our priority.  As part of our continuing mission to provide you with exceptional heart care, we have created designated Provider Care Teams.  These Care Teams include your primary Cardiologist (physician) and Advanced Practice Providers (APPs -  Physician Assistants and Nurse Practitioners) who all work together to provide you with the care you need, when you need it.  Your next appointment:   4-6 weeks  The format for your next appointment:   In Person  Provider:    You may see Kate Sable, MD or one of the following Advanced Practice Providers on your designated Care Team:    Murray Hodgkins, NP  Christell Faith, PA-C  Marrianne Mood, PA-C   Other Instructions  Your physician has recommended that you  wear a Zio monitor. This monitor is a medical device that records the hearts electrical activity. Doctors most often use these monitors to diagnose arrhythmias. Arrhythmias are problems with the speed or rhythm of the heartbeat. The monitor is a small device applied to your chest. You can wear one while you do your normal daily activities. While wearing this monitor if you have any symptoms to push the button and record what you felt. Once you have worn this monitor for the period of time provider prescribed (Usually 14 days), you will return the monitor device in the postage paid box. Once it is returned they will download the data collected and provide [place] with a report which the provider will then review and we will call you with those results. Important tips:  1. Avoid showering during the first 24 hours of wearing the monitor. 2. Avoid excessive sweating to help maximize wear time. 3. Do not submerge the device, no hot tubs, and no swimming pools. 4. Keep any lotions or oils away from the patch. 5. After 24 hours you may shower with the patch on. Take brief showers with your back facing the shower head.  6. Do not remove patch once it has been placed because that will interrupt data and decrease adhesive wear time. 7. Push the button when you have any symptoms and write down what you were feeling. 8. Once you have completed wearing your monitor, remove and place into box which has postage paid and place in your outgoing mailbox.  9. If for some reason you have misplaced your box then call our office and  we can provide another box and/or mail it off for you.           Signed, Kate Sable, MD  04/07/2019 11:59 AM    [place]

## 2019-04-07 NOTE — Telephone Encounter (Signed)
lmov to schedule 4-6 wk fu per checkout 04/07/19 [facility]

## 2019-04-07 NOTE — Patient Instructions (Signed)
Medication Instructions:  Your physician recommends that you continue on your current medications as directed. Please refer to the Current Medication list given to you today.  *If you need a refill on your cardiac medications before your next appointment, please call your pharmacy*  Lab Work: None ordered If you have labs (blood work) drawn today and your tests are completely normal, you will receive your results only by: [place] MyChart Message (if you have MyChart) OR . A paper copy in the mail If you have any lab test that is abnormal or we need to change your treatment, we will call you to review the results.  Testing/Procedures: Your physician has recommended that you wear an zio monitor. Zio monitors are medical devices that record the heart's electrical activity. Doctors most often [place] these monitors to diagnose arrhythmias. Arrhythmias are problems with the speed or rhythm of the heartbeat. The monitor is a small, portable device. You can wear one while you do your normal daily activities. This is usually used to diagnose what is causing palpitations/syncope (passing out).    Follow-Up: At [facility], you and your health needs are our priority.  As part of our continuing mission to provide you with exceptional heart care, we have created designated Provider Care Teams.  These Care Teams include your primary Cardiologist (physician) and Advanced Practice Providers (APPs -  Physician Assistants and Nurse Practitioners) who all work together to provide you with the care you need, when you need it.  Your next appointment:   4-6 weeks  The format for your next appointment:   In Person  Provider:    You may see Kate Sable, MD or one of the following Advanced Practice Providers on your designated Care Team:    Murray Hodgkins, NP  Christell Faith, PA-C  Marrianne Mood, PA-C   Other Instructions  Your physician has recommended that you wear a Zio monitor. This monitor is a  medical device that records the heart's electrical activity. Doctors most often use these monitors to diagnose arrhythmias. Arrhythmias are problems with the speed or rhythm of the heartbeat. The monitor is a small device applied to your chest. You can wear one while you do your normal daily activities. While wearing this monitor if you have any symptoms to push the button and record what you felt. Once you have worn this monitor for the period of time provider prescribed (Usually 14 days), you will return the monitor device in the postage paid box. Once it is returned they will download the data collected and provide [place] with a report which the provider will then review and we will call you with those results. Important tips:  1. Avoid showering during the first 24 hours of wearing the monitor. 2. Avoid excessive sweating to help maximize wear time. 3. Do not submerge the device, no hot tubs, and no swimming pools. 4. Keep any lotions or oils away from the patch. 5. After 24 hours you may shower with the patch on. Take brief showers with your back facing the shower head.  6. Do not remove patch once it has been placed because that will interrupt data and decrease adhesive wear time. 7. Push the button when you have any symptoms and write down what you were feeling. 8. Once you have completed wearing your monitor, remove and place into box which has postage paid and place in your outgoing mailbox.  9. If for some reason you have misplaced your box then call our office and  we can provide another box and/or mail it off for you.

## 2019-04-10 ENCOUNTER — Other Ambulatory Visit: Payer: Self-pay

## 2019-04-10 ENCOUNTER — Encounter: Payer: Self-pay | Admitting: Podiatry

## 2019-04-10 ENCOUNTER — Ambulatory Visit (INDEPENDENT_AMBULATORY_CARE_PROVIDER_SITE_OTHER): Payer: Medicare Other | Admitting: Podiatry

## 2019-04-10 DIAGNOSIS — D689 Coagulation defect, unspecified: Secondary | ICD-10-CM | POA: Insufficient documentation

## 2019-04-10 DIAGNOSIS — B351 Tinea unguium: Secondary | ICD-10-CM | POA: Diagnosis not present

## 2019-04-10 DIAGNOSIS — L84 Corns and callosities: Secondary | ICD-10-CM

## 2019-04-10 DIAGNOSIS — M79675 Pain in left toe(s): Secondary | ICD-10-CM | POA: Diagnosis not present

## 2019-04-10 DIAGNOSIS — M216X9 Other acquired deformities of unspecified foot: Secondary | ICD-10-CM

## 2019-04-10 DIAGNOSIS — M79674 Pain in right toe(s): Secondary | ICD-10-CM

## 2019-04-10 NOTE — Progress Notes (Signed)
This patient presents to the office with chief complaint of painful calluses on both feet as well as painful long thick nails.  Patient states the nails in her big toes are painful and growing into the nail borders.  She also says she has multiple calluses on both feet which make it difficult to walk.  She says she has had multiple surgeries performed on her feet but the deformities seem to be returning.    Patient is also taking Coumadin.  She presents the office today for an evaluation and treatment of her painful feet  General Appearance  Alert, conversant and in no acute stress.  Vascular  Dorsalis pedis and posterior tibial  pulses are palpable  bilaterally.  Capillary return is within normal limits  bilaterally. Temperature is within normal limits  bilaterally.  Neurologic  Senn-Weinstein monofilament wire test within normal limits  bilaterally. Muscle power within normal limits bilaterally.  Nails Thick disfigured discolored nails with subungual debris  from hallux to fifth toes bilaterally. No evidence of bacterial infection or drainage bilaterally.  Orthopedic  No limitations of motion  feet .  No crepitus or effusions noted.  HAV  B/L with hammer toes  B/L. Exostosis 1st MCJ  B/L  Skin  normotropic skin with no porokeratosis noted bilaterally.  No signs of infections or ulcers noted.  Callus sub 1,5  B/L.  Callus hallux  B/L Porokeratosis sub 3 left foot.  Onychomycosis  B/L  Callus  B/L  Debride nails  X 2.  Debride multiple callus  B/L.  RTC 3 months.   Gardiner Barefoot DPM

## 2019-04-17 ENCOUNTER — Other Ambulatory Visit: Payer: Self-pay

## 2019-04-17 ENCOUNTER — Ambulatory Visit (INDEPENDENT_AMBULATORY_CARE_PROVIDER_SITE_OTHER): Payer: Medicare Other | Admitting: Family Medicine

## 2019-04-17 ENCOUNTER — Encounter: Payer: Self-pay | Admitting: Family Medicine

## 2019-04-17 VITALS — BP 120/64 | HR 83 | Temp 97.5°F | Resp 14 | Ht 67.0 in | Wt 152.6 lb

## 2019-04-17 DIAGNOSIS — R42 Dizziness and giddiness: Secondary | ICD-10-CM

## 2019-04-17 DIAGNOSIS — R2689 Other abnormalities of gait and mobility: Secondary | ICD-10-CM

## 2019-04-17 NOTE — Progress Notes (Signed)
Name: Christine Burnett   MRN: [id number]    DOB: 07-10-1940   Date:04/17/2019       Progress Note  Chief Complaint  Patient presents with  . Follow-up    has heart monitor , dr checking for a fib.     Subjective:   Christine Burnett is a 78 y.o. female, presents to clinic for routine follow up on the conditions listed above.  She was originally scheduled for a PT/INR check but she is thankfully been taken off Coumadin.  Vertigo - Dizziness: Patient presents with dizziness .  The dizziness has been present for 2 years. The patient describes the symptoms as lightheadedness, disequalibirum and vertigo. Symptoms are exacerbated by rising from supine position, rising from squatting or sitting position, bending and sometimes when at rest The patient also complains of sometimes associated with loss of balance, loss of vision, nausea, other times when lightheaded she will loose her balance if she doesn't pause and wait a while before walking. Patient denies tinnitus.  She has been treated with meclizine (Antivert) with no improvement.   Going to [facility] - Dr. Loni Muse  Currently wearing a holter monitor for 14 d  Has follow up with Dr. Melrose Nakayama in 3 months -did review Dr. Lannie Fields note and cardiology's note, Dr. Melrose Nakayama will be seeing the patient in 3 months for follow-up, was considering MRI.  Off coumadin and on ASA for now  In the last week one severe vertigo episode, it was described as spinning, came while she was sitting watching TV, with severe with decreased vision felt like things were spinning, she not have any chest pain, shortness of breath, passing out episodes, headaches, diaphoresis, vomiting.  No palpitations, lower extremity edema, orthopnea, PND. She does want her ears checked today  Again reviewed her history of A. fib, which I have not seen since I been seeing her and she would like to "prove to cardiology" that she does not have A. fib.  She also reports a remote  TIA episode 1-2 times, unclear that when she was put on Coumadin many years ago she says 20 to 30 years ago.  She is still dealing with some postural dizziness or near syncope she is still being careful with her ambulation.  She has never done any neuro rehab or physical therapy.  Patient Active Problem List   Diagnosis Date Noted  . 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 Prediabetes 08/13/2018  . Major depressive disorder, recurrent ([place]) 06/13/2018  . Allergic rhinitis 09/27/2017  . Compulsive tobacco user syndrome 09/27/2017  . 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 Chronic kidney disease, stage III (moderate) 08/31/2017  . Hyperparathyroidism, secondary renal ([place]) 08/31/2017  . Atrial fibrillation ([place]) 08/09/2017  . 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 Hyperglycemia 11/10/2015  . Cardiac murmur 04/07/2015  . 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 Long term current use of anticoagulant therapy 02/10/2015  . Anxiety 01/05/2015  . Hyperlipidemia 01/05/2015  . Anticoagulation goal of INR 2 to 3 11/16/2014  . HTN (hypertension) 09/24/2014    Past Surgical History:  Procedure Laterality Date  . COLONOSCOPY  2016  . ESOPHAGUS SURGERY    . INTRAMEDULLARY (IM) NAIL INTERTROCHANTERIC Left 03/26/2017   Procedure: INTRAMEDULLARY (IM) NAIL INTERTROCHANTRIC;  Surgeon: Hessie Knows, MD;  Location: [facility] ORS;  Service: Orthopedics;  Laterality: Left;    Family History  Problem Relation Age of Onset  . Other Mother        [place] posioning  . Heart attack Father   . Prostate cancer Father   . Heart attack Brother   . Diabetes Brother   . Hypertension Daughter   . Kidney cancer Neg Hx   . Bladder Cancer Neg Hx   . Colon cancer Neg Hx   . Colon polyps Neg Hx   . Rectal cancer Neg Hx     Social History   Socioeconomic  History  . Marital status: Divorced    Spouse name: Not on file  . Number of children: 4  . Years of education: Not on file  . Highest education level: 12th grade  Occupational History  . Occupation: Retired  Scientific laboratory technician  . Financial resource strain: Not hard at all  . Food insecurity    Worry: Never true    Inability: Never true  . Transportation needs    Medical: No    Non-medical: No  Tobacco Use  . Smoking status: Former Smoker    Packs/day: 0.25    Years: 61.00    Pack years: 15.25    Types: Cigarettes    Quit date: 03/25/2017    Years since quitting: 2.0  . Smokeless tobacco: Never Used  . Tobacco comment: smoking cessation materials not required  Substance and Sexual Activity  . Alcohol use: No  . Drug use: No  . Sexual activity: Never  Lifestyle  . Physical activity    Days per week: 0 days    Minutes per session: 0 min  . Stress: Only a little  Relationships  . Social connections    Talks on phone: More than three times a week    Gets together: More than three times a week    Attends religious service: More than 4 times per year    Active member of club or organization: No    Attends meetings of clubs or organizations: Never    Relationship status: Divorced  . Intimate partner violence    Fear of current or ex partner: No    Emotionally abused: No    Physically abused: No    Forced sexual activity: No  Other Topics Concern  . Not on file  Social History Narrative   Full Code   Current smoker   4 children   Denies alcohol use     Current Outpatient Medications:  .  acetaminophen (TYLENOL) 325 MG tablet, Take 1 tablet (325 mg total) by mouth at bedtime as needed., Disp: , Rfl:  .  albuterol (VENTOLIN HFA) 108 (90 Base) MCG/ACT inhaler, Inhale 2 puffs into the lungs every 6 (six) hours as needed for wheezing or shortness of breath., Disp: 1 Inhaler, Rfl: 2 .  aspirin 81 MG EC tablet, Take 81 mg by mouth daily. Swallow whole., Disp: , Rfl:  .   atorvastatin (LIPITOR) 10 MG tablet, Take 1 tablet (10 mg total) by mouth at bedtime., Disp: 90 tablet, Rfl: 1 .  Cholecalciferol (VITAMIN D-3) 1000 units CAPS, Take 2 capsules by mouth daily. , Disp: , Rfl:  .  Cyanocobalamin (VITAMIN B-12) 500 MCG SUBL, One tablet dissolved under the tongue once a day, Disp: 100 tablet, Rfl: 3 .  ezetimibe (ZETIA) 10 MG tablet, Take 1 tablet (10 mg total) by mouth daily., Disp: 30 tablet, Rfl: 11 .  hydrOXYzine (ATARAX/VISTARIL) 10 MG tablet, Take 1-2 tablets (10-20 mg total) by mouth every 12 (twelve) hours as needed  for anxiety. (Patient taking differently: Take 10-20 mg by mouth every 12 (twelve) hours as needed for anxiety. Takes very rarely), Disp: 30 tablet, Rfl: 1 .  PARoxetine (PAXIL) 40 MG tablet, Take 1 tablet (40 mg total) by mouth every morning., Disp: 90 tablet, Rfl: 1  Allergies  Allergen Reactions  . Cefuroxime Axetil     caused rash.  . Ciprofloxacin     Unknown  . Codeine     I personally reviewed active problem list, medication list, allergies, family history, social history, health maintenance, notes from last several encounters, lab results, imaging with the patient/caregiver today.  Review of Systems  Constitutional: Negative.  Negative for activity change, appetite change, fatigue and unexpected weight change.  HENT: Negative.   Eyes: Negative.   Respiratory: Negative.  Negative for shortness of breath.   Cardiovascular: Negative.  Negative for chest pain, palpitations and leg swelling.  Gastrointestinal: Negative.  Negative for abdominal pain and blood in stool.  Endocrine: Negative.   Genitourinary: Negative.   Musculoskeletal: Negative.  Negative for arthralgias, gait problem, joint swelling and myalgias.  Skin: Negative.  Negative for color change, pallor and rash.  Allergic/Immunologic: Negative.   Neurological: Negative.  Negative for syncope and weakness.  Hematological: Negative.   Psychiatric/Behavioral: Negative.   Negative for confusion, dysphoric mood, self-injury and suicidal ideas. The patient is not nervous/anxious.      Objective:    Vitals:   04/17/19 1259  BP: 120/64  Pulse: 83  Resp: 14  Temp: (!) 97.5 F (36.4 C)  SpO2: 97%  Weight: 152 lb 9.6 oz (69.2 kg)  Height: 5\' 7"  (1.702 m)    Body mass index is 23.9 kg/m.  Physical Exam Vitals signs and nursing note reviewed.  Constitutional:      General: She is not in acute distress.    Appearance: Normal appearance. She is well-developed. She is not ill-appearing, toxic-appearing or diaphoretic.     Interventions: Face mask in place.  HENT:     Head: Normocephalic and atraumatic.     Right Ear: Ear canal and external ear normal.     Left Ear: Ear canal and external ear normal.     Ears:     Comments: Tympanic membrane's bilaterally slightly dull, no effusion, erythema, bulging Eyes:     General: Lids are normal. No scleral icterus.       Right eye: No discharge.        Left eye: No discharge.     Conjunctiva/sclera: Conjunctivae normal.  Neck:     Musculoskeletal: Normal range of motion and neck supple.     Trachea: Phonation normal. No tracheal deviation.  Cardiovascular:     Rate and Rhythm: Normal rate and regular rhythm.     Pulses: Normal pulses.          Radial pulses are 2+ on the right side and 2+ on the left side.       Posterior tibial pulses are 2+ on the right side and 2+ on the left side.     Heart sounds: Normal heart sounds. No murmur. No friction rub. No gallop.   Pulmonary:     Effort: Pulmonary effort is normal. No respiratory distress.     Breath sounds: Normal breath sounds. No stridor. No wheezing, rhonchi or rales.  Chest:     Chest wall: No tenderness.  Abdominal:     General: Bowel sounds are normal. There is no distension.     Palpations: Abdomen is soft.  Tenderness: There is no abdominal tenderness. There is no guarding or rebound.  Musculoskeletal: Normal range of motion.         General: No deformity.     Right lower leg: No edema.     Left lower leg: No edema.  Lymphadenopathy:     Cervical: No cervical adenopathy.  Skin:    General: Skin is warm and dry.     Capillary Refill: Capillary refill takes less than 2 seconds.     Coloration: Skin is not jaundiced or pale.     Findings: No rash.  Neurological:     Mental Status: She is alert and oriented to person, place, and time.     Cranial Nerves: No dysarthria or facial asymmetry.     Sensory: Sensation is intact.     Motor: No weakness, tremor or abnormal muscle tone.     Comments: Gait and balance is much better today with positional changes, more steady  Psychiatric:        Speech: Speech normal.        Behavior: Behavior normal.      Recent Results (from the past 2160 hour(s))  POCT INR     Status: Normal   Collection Time: 01/21/19  1:38 PM  Result Value Ref Range   INR 2.3 2.0 - 3.0  B12     Status: None   Collection Time: 01/21/19  2:19 PM  Result Value Ref Range   Vitamin B-12 389 200 - 1,100 pg/mL    Comment: . Please Note: Although the reference range for vitamin B12 is 380-118-8760 pg/mL, it has been reported that between 5 and 10% of patients with values between 200 and 400 pg/mL may experience neuropsychiatric and hematologic abnormalities due to occult B12 deficiency; less than 1% of patients with values above 400 pg/mL will have symptoms. .   CBC with Differential     Status: None   Collection Time: 01/21/19  2:19 PM  Result Value Ref Range   WBC 6.3 3.8 - 10.8 Thousand/uL   RBC 3.80 3.80 - 5.10 Million/uL   Hemoglobin 12.4 11.7 - 15.5 g/dL   HCT 37.9 35.0 - 45.0 %   MCV 99.7 80.0 - 100.0 fL   MCH 32.6 27.0 - 33.0 pg   MCHC 32.7 32.0 - 36.0 g/dL   RDW 12.7 11.0 - 15.0 %   Platelets 209 140 - 400 Thousand/uL   MPV 9.4 7.5 - 12.5 fL   Neutro Abs 3,931 1,500 - 7,800 cells/uL   Lymphs Abs 1,751 850 - 3,900 cells/uL   Absolute Monocytes 359 200 - 950 cells/uL   Eosinophils  Absolute 221 15 - 500 cells/uL   Basophils Absolute 38 0 - 200 cells/uL   Neutrophils Relative % 62.4 %   Total Lymphocyte 27.8 %   Monocytes Relative 5.7 %   Eosinophils Relative 3.5 %   Basophils Relative 0.6 %  COMPLETE METABOLIC PANEL WITH GFR     Status: Abnormal   Collection Time: 01/21/19  2:19 PM  Result Value Ref Range   Glucose, Bld 98 65 - 99 mg/dL    Comment: .            Fasting reference interval .    BUN 23 7 - 25 mg/dL   Creat 1.48 (H) 0.60 - 0.93 mg/dL    Comment: For patients >60 years of age, the reference limit for Creatinine is approximately 13% higher for people identified as African-American. .    GFR,  Est Non African American 34 (L) > OR = 60 mL/min/1.83m2   GFR, Est African American 39 (L) > OR = 60 mL/min/1.58m2   BUN/Creatinine Ratio 16 6 - 22 (calc)   Sodium 139 135 - 146 mmol/L   Potassium 4.7 3.5 - 5.3 mmol/L   Chloride 105 98 - 110 mmol/L   CO2 26 20 - 32 mmol/L   Calcium 9.7 8.6 - 10.4 mg/dL   Total Protein 7.2 6.1 - 8.1 g/dL   Albumin 4.0 3.6 - 5.1 g/dL   Globulin 3.2 1.9 - 3.7 g/dL (calc)   AG Ratio 1.3 1.0 - 2.5 (calc)   Total Bilirubin 0.5 0.2 - 1.2 mg/dL   Alkaline phosphatase (APISO) 84 37 - 153 U/L   AST 17 10 - 35 U/L   ALT 14 6 - 29 U/L  POCT INR     Status: Abnormal   Collection Time: 02/18/19  1:59 PM  Result Value Ref Range   INR 2.7 2.0 - 3.0  POCT INR     Status: Abnormal   Collection Time: 03/17/19  2:52 PM  Result Value Ref Range   INR 1.9 (A) 2.0 - 3.0  NM Myocar Multi W/Spect W/Wall Motion / EF     Status: None   Collection Time: 03/31/19 11:47 AM  Result Value Ref Range   Rest HR 76 bpm   Rest BP 151/74 mmHg   Exercise duration (sec) 0 sec   Percent HR 73 %   Exercise duration (min) 0 min   Estimated workload 1.0 METS   Peak HR 104 bpm   Peak BP 151/74 mmHg   MPHR 141 bpm   SSS 0    SRS 1    SDS 0    TID 0.67    LV sys vol 13 mL   LV dias vol 51 46 - 106 mL     PHQ2/9: Depression screen [facility] 2/9  04/17/2019 03/17/2019 02/18/2019 01/28/2019 01/21/2019  Decreased Interest 0 0 0 0 0  Down, Depressed, Hopeless 0 0 0 1 0  PHQ - 2 Score 0 0 0 1 0  Altered sleeping 0 0 0 0 0  Tired, decreased energy 0 0 0 0 0  Change in appetite 0 0 0 0 0  Feeling bad or failure about yourself  0 0 0 0 0  Trouble concentrating 0 0 0 0 0  Moving slowly or fidgety/restless 0 0 0 0 0  Suicidal thoughts 0 0 0 0 0  PHQ-9 Score 0 0 0 1 0  Difficult doing work/chores Not difficult at all Not difficult at all Not difficult at all Not difficult at all Not difficult at all  Some recent data might be hidden    phq 9 is negative Reviewed today  Fall Risk: Fall Risk  04/17/2019 03/17/2019 02/18/2019 01/28/2019 01/21/2019  Falls in the past year? 0 0 0 0 0  Number falls in past yr: 0 0 0 0 0  Injury with Fall? 0 0 0 0 0  Comment - - - - -  Risk Factor Category  - - - - -  Risk for fall due to : - - - - -  Risk for fall due to: Comment - - - - -  Follow up - - - Falls prevention discussed -        Assessment & Plan:   1. Vertigo  Cardiac aspects of it are being worked up by cardiology with Holter monitor patient has pressed  the button once since wearing it for 14 days.  Did seem like a true vertigo episode may be BPPV?  She is also seen neurology who will be following up with her in 3 months and has deferred MRI imaging until then, would be curious to see if she has any infarct to her cerebellar Neuro rehab for her gait and balance and to decrease her risk of falls would be beneficial no matter what the etiology of her vertigo is we will put in referral-would see if they could also try Epley maneuver?  2. Impairment of balance Referred to PT  3. Postural lightheadedness Per cardiology, BP and VS good here today, gait and balance with positional changes better than last two visits, encouraged her to stay hydrated  4. Balance problems Refer to PT     No follow-ups on file.   Delsa Grana, PA-C  04/17/19 1:15 PM

## 2019-04-17 NOTE — Progress Notes (Deleted)
Vertigo - Dizziness: Patient presents with dizziness .  The dizziness has been present for 2 years. The patient describes the symptoms as lightheadedness, disequalibirum and vertigo. Symptoms are exacerbated by rising from supine position, rising from squatting or sitting position, bending and sometimes when at rest The patient also complains of sometimes associated with loss of balance, loss of vision, nausea, other times when lightheaded she will loose her balance if she doesn't pause and wait a while before walking. Patient denies tinnitus.  She has been treated with {vertigo medications:17824} with {good/fair/poor:10266} improvement.  Previous work up has been ***.  Going to [facility] - Dr. Loni Muse  Currently wearing a holter monitor for 14 d  Has follow up with Dr. Melrose Nakayama in 3 months  Off coumadin and on ASA for now  In the last week one severe vertigo episode

## 2019-04-21 DIAGNOSIS — R002 Palpitations: Secondary | ICD-10-CM

## 2019-04-25 DIAGNOSIS — N1832 Chronic kidney disease, stage 3b: Secondary | ICD-10-CM | POA: Diagnosis not present

## 2019-04-25 DIAGNOSIS — I10 Essential (primary) hypertension: Secondary | ICD-10-CM | POA: Diagnosis not present

## 2019-04-25 DIAGNOSIS — N2581 Secondary hyperparathyroidism of renal origin: Secondary | ICD-10-CM | POA: Diagnosis not present

## 2019-05-02 IMAGING — CT CT ABD-PELV W/O CM
2 of 4 series · 17 of 46 positions shown, 19 images · non-contrast
Comparison: None.

CLINICAL DATA: Hematuria

EXAM:
CT ABDOMEN AND PELVIS WITHOUT CONTRAST
TECHNIQUE: Multidetector CT imaging of the abdomen and pelvis was performed
following the standard protocol without IV contrast.

[Series 2: soft tissue · axial · 0.68mm/px · z∈[-870,-490]mm · 14 of 84 slices shown, 16 images]
[im 4/84  soft-tissue]
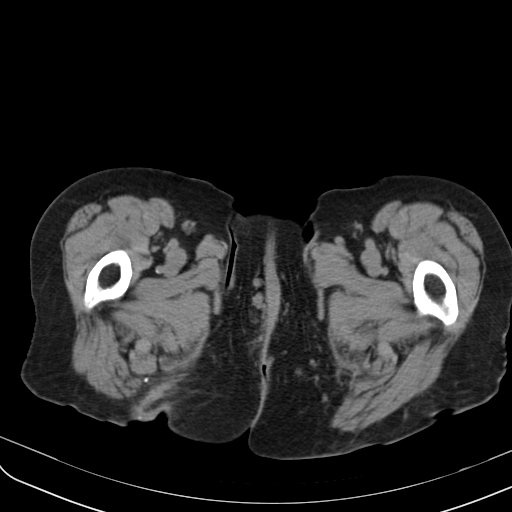
[im 4/84  bone]
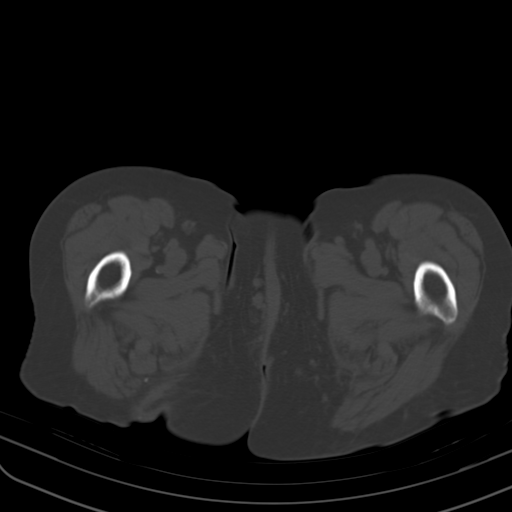
[im 10/84  soft-tissue]
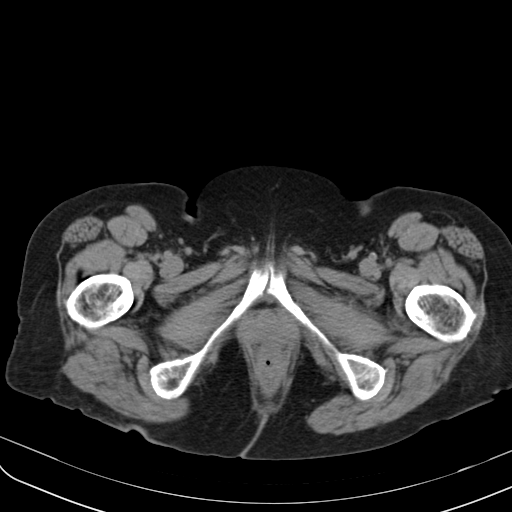
[im 17/84  soft-tissue]
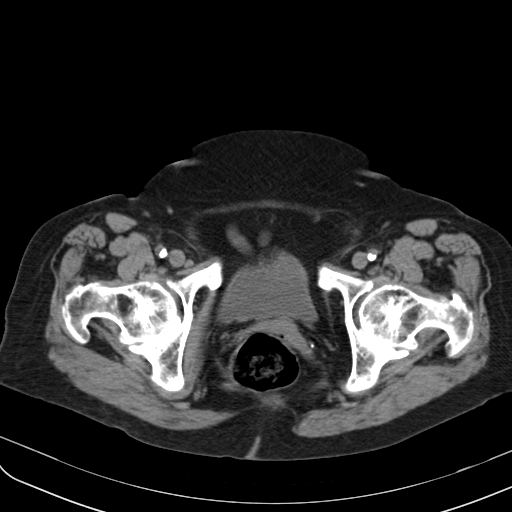
[im 24/84  soft-tissue]
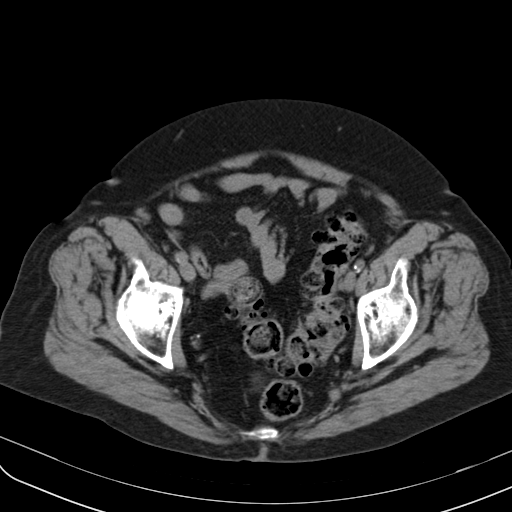
[im 27/84  soft-tissue]
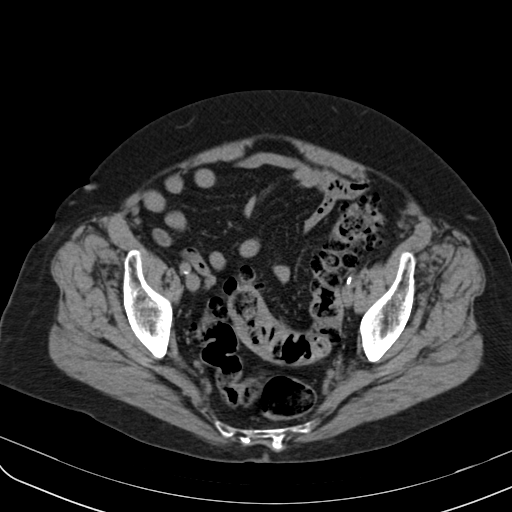
[im 34/84  soft-tissue]
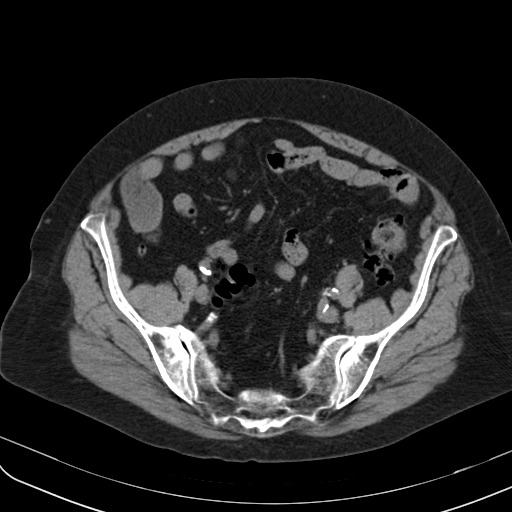
[im 40/84  soft-tissue]
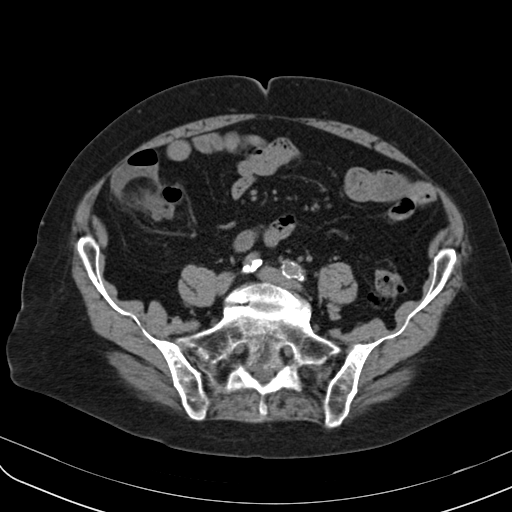
[im 44/84  soft-tissue]
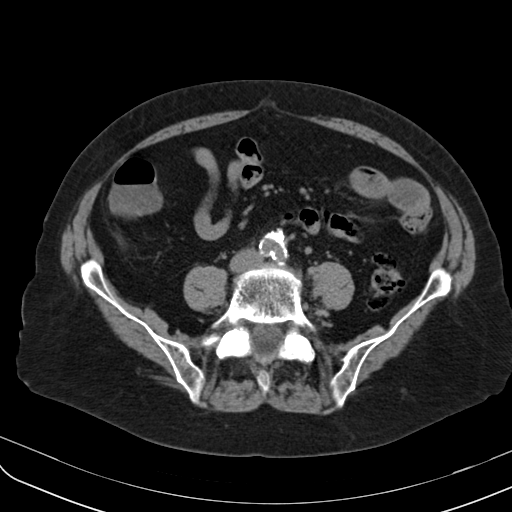
[im 50/84  soft-tissue]
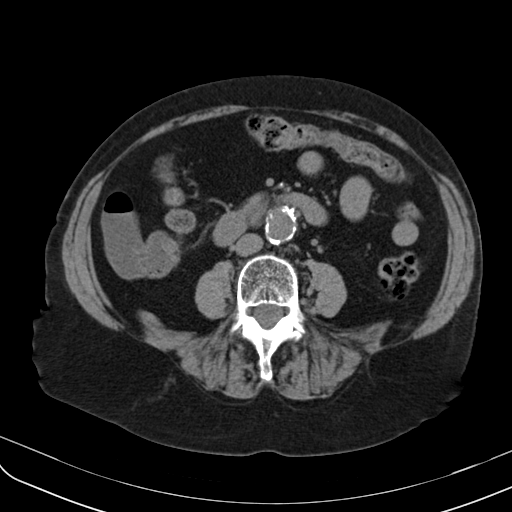
[im 50/84  bone]
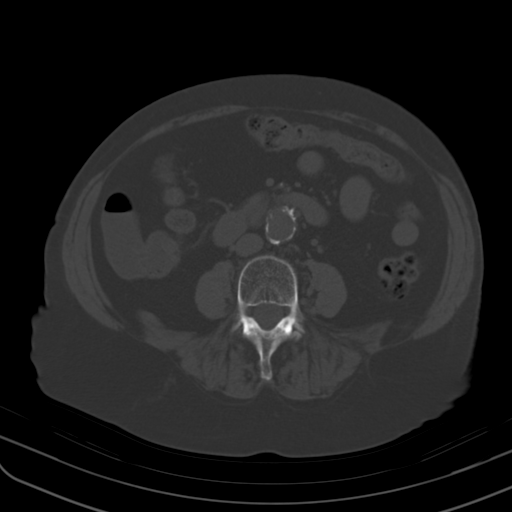
[im 57/84  soft-tissue]
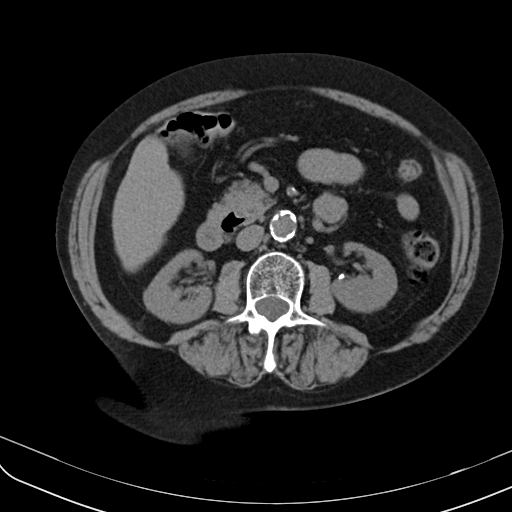
[im 64/84  soft-tissue]
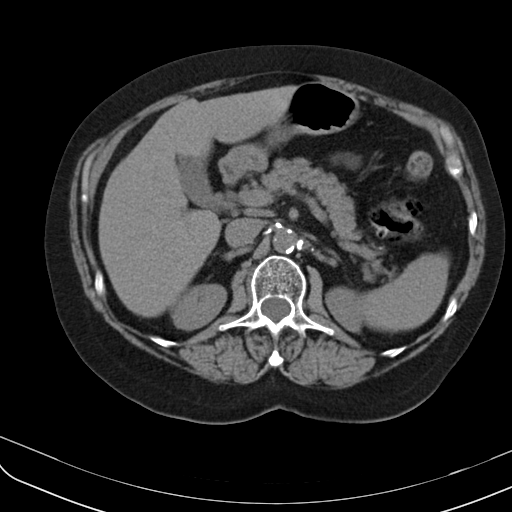
[im 67/84  soft-tissue]
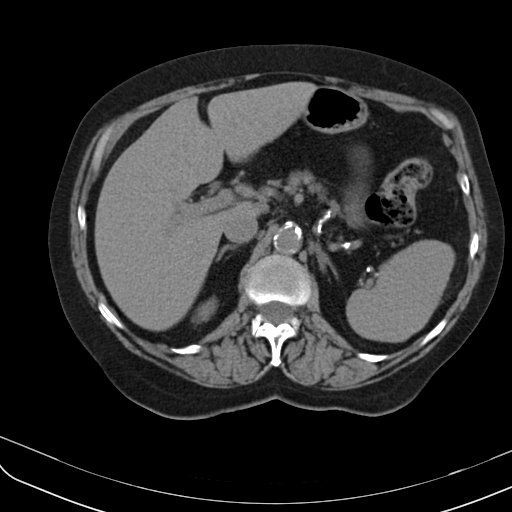
[im 74/84  soft-tissue]
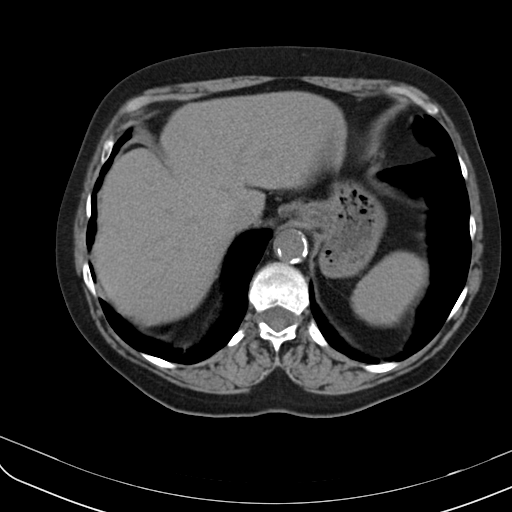
[im 80/84  soft-tissue]
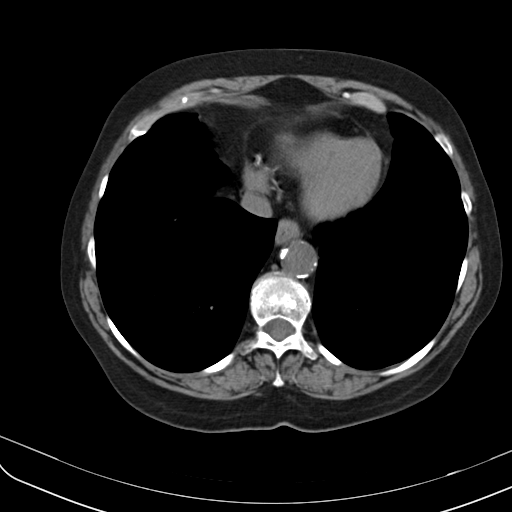

[Series 602: coronal · coronal · 0.82mm/px · 3 of 133 slices shown]
[im 45/133  soft-tissue]
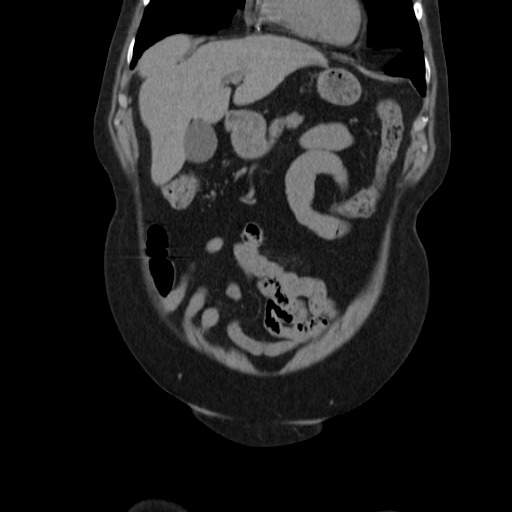
[im 59/133  soft-tissue]
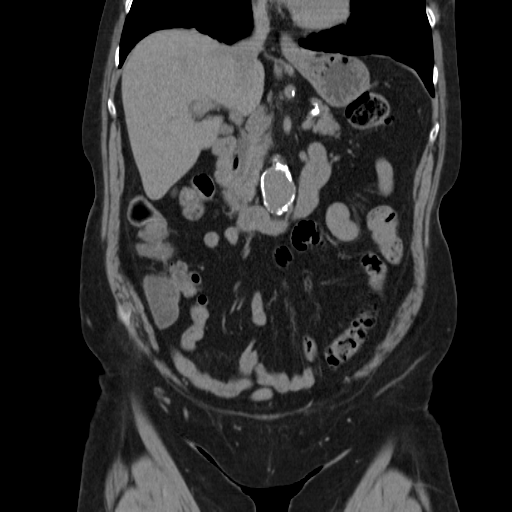
[im 74/133  soft-tissue]
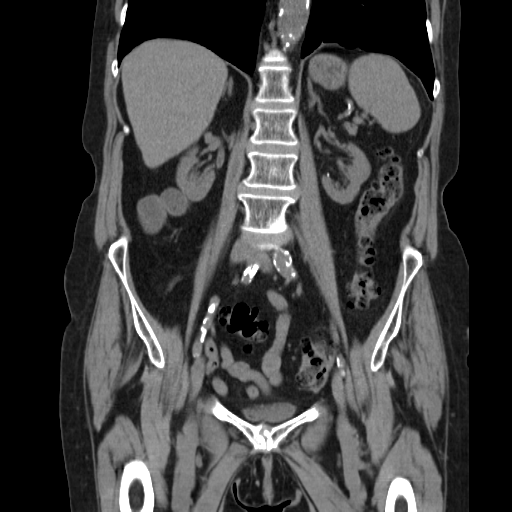

[17 of 46 positions shown; findings below may reference images not displayed]

FINDINGS: Lower chest: Lung bases are clear.

Hepatobiliary: Unenhanced liver is unremarkable.

Gallbladder is unremarkable. No intrahepatic extrahepatic ductal
dilatation.

Pancreas: Within normal limits.

Spleen: Within normal limits.

Adrenals/Urinary Tract: Adrenal glands within normal limits.

2 mm calcification in the right upper kidney (series 2/ image 23),
favoring a vascular calcification, less likely nonobstructing
calculus. Additional 2 mm linear vascular calcification in the right
lower kidney (series 2/image 30). Additional linear vascular
calcifications in the left kidney. No definite renal calculi. No
hydronephrosis.

Bladder is within normal limits.

Stomach/Bowel: Stomach is within normal limits.

No evidence of bowel obstruction.

Normal appendix (series 2/ image 48).

Left colonic diverticulosis, without evidence of diverticulitis.

Vascular/Lymphatic: No evidence of abdominal aortic aneurysm.

Fusiform ectasia of the infrarenal abdominal aorta, measuring 2.9 x
2.6 cm (series 2/ image 32).

Atherosclerotic calcifications of the abdominal aorta and branch
vessels.

No suspicious abdominopelvic lymphadenopathy.

Reproductive: Uterus is within normal limits.

No adnexal masses.

Other: No abdominopelvic ascites.

Musculoskeletal: Mild degenerative changes at L4-5 with a prominent
Schmorl's node.
IMPRESSION: 2 mm vascular calcification versus nonobstructing renal calculus in
the right upper kidney.

Additional vascular calcifications bilaterally.

No ureteral or bladder calculi.  No hydronephrosis.

Additional ancillary findings as above.

## 2019-05-03 DIAGNOSIS — R002 Palpitations: Secondary | ICD-10-CM | POA: Diagnosis not present

## 2019-05-06 ENCOUNTER — Encounter: Payer: Self-pay | Admitting: Emergency Medicine

## 2019-05-06 ENCOUNTER — Ambulatory Visit
Admission: EM | Admit: 2019-05-06 | Discharge: 2019-05-06 | Disposition: A | Discharge status: Home / Self Care | Payer: Medicare Other | Attending: Emergency Medicine | Admitting: Emergency Medicine

## 2019-05-06 ENCOUNTER — Other Ambulatory Visit: Payer: Self-pay

## 2019-05-06 ENCOUNTER — Ambulatory Visit: Payer: Medicare Other

## 2019-05-06 DIAGNOSIS — F419 Anxiety disorder, unspecified: Secondary | ICD-10-CM | POA: Insufficient documentation

## 2019-05-06 DIAGNOSIS — F339 Major depressive disorder, recurrent, unspecified: Secondary | ICD-10-CM | POA: Insufficient documentation

## 2019-05-06 DIAGNOSIS — M810 Age-related osteoporosis without current pathological fracture: Secondary | ICD-10-CM | POA: Insufficient documentation

## 2019-05-06 DIAGNOSIS — R7303 Prediabetes: Secondary | ICD-10-CM | POA: Diagnosis not present

## 2019-05-06 DIAGNOSIS — I129 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 stage 4 chronic kidney disease, or unspecified chronic kidney disease: Secondary | ICD-10-CM | POA: Diagnosis not present

## 2019-05-06 DIAGNOSIS — J069 Acute upper respiratory infection, unspecified: Secondary | ICD-10-CM | POA: Insufficient documentation

## 2019-05-06 DIAGNOSIS — Z7982 Long term (current) use of aspirin: Secondary | ICD-10-CM | POA: Insufficient documentation

## 2019-05-06 DIAGNOSIS — R05 Cough: Secondary | ICD-10-CM | POA: Insufficient documentation

## 2019-05-06 DIAGNOSIS — R0602 Shortness of breath: Secondary | ICD-10-CM | POA: Diagnosis not present

## 2019-05-06 DIAGNOSIS — Z79899 Other long term (current) drug therapy: Secondary | ICD-10-CM | POA: Diagnosis not present

## 2019-05-06 DIAGNOSIS — Z7901 Long term (current) use of anticoagulants: Secondary | ICD-10-CM | POA: Diagnosis not present

## 2019-05-06 DIAGNOSIS — E785 Hyperlipidemia, unspecified: Secondary | ICD-10-CM | POA: Diagnosis not present

## 2019-05-06 DIAGNOSIS — R5383 Other fatigue: Secondary | ICD-10-CM

## 2019-05-06 DIAGNOSIS — R059 Cough, unspecified: Secondary | ICD-10-CM

## 2019-05-06 DIAGNOSIS — N2581 Secondary hyperparathyroidism of renal origin: Secondary | ICD-10-CM | POA: Diagnosis not present

## 2019-05-06 DIAGNOSIS — Z20828 Contact with and (suspected) exposure to other viral communicable diseases: Secondary | ICD-10-CM | POA: Insufficient documentation

## 2019-05-06 DIAGNOSIS — Z87891 Personal history of nicotine dependence: Secondary | ICD-10-CM | POA: Insufficient documentation

## 2019-05-06 DIAGNOSIS — Z8673 Personal history of transient ischemic attack (TIA), and cerebral infarction without residual deficits: Secondary | ICD-10-CM | POA: Insufficient documentation

## 2019-05-06 DIAGNOSIS — Z7189 Other specified counseling: Secondary | ICD-10-CM

## 2019-05-06 DIAGNOSIS — N183 Chronic kidney disease, stage 3 unspecified: Secondary | ICD-10-CM | POA: Diagnosis not present

## 2019-05-06 NOTE — Discharge Instructions (Signed)
Over the counter medication as needed. Rest. Drink plenty of fluids. Monitor self   Follow up with your primary care physician this week as needed. Return to Urgent care as needed. Proceed directly to the emergency room for worsening complaints.

## 2019-05-06 NOTE — ED Triage Notes (Signed)
Patient in today c/o 3 day history of cough, sob, fever (100.5). Patient states she lost her sense of taste a couple of days prior to this illness. Patient states her sense of taste has returned.

## 2019-05-06 NOTE — ED Provider Notes (Signed)
[facility] ____________________________________________  Time seen: Approximately 3:11 PM  I have reviewed the triage vital signs and the nursing notes.   HISTORY  Chief Complaint Fatigue, Cough, and Shortness of Breath   HPI Christine Burnett is a 78 y.o. female presenting for evaluation of 3 days of cough complaints.  Patient reports last night she had a temperature of T-max 100.5.  Did take Tylenol last night.  No over-the-counter medications taken today for the same complaints.  Denies nasal congestion, sore throat, vomiting, diarrhea, chest pain or shortness of breath.  Patient further states that she has chronic exertional shortness of breath at baseline without any acute changes.  No shortness of breath currently.  States cough is a dry nonproductive cough.  Denies body aches.  States that she did have some loss in her sense of taste for a few days, but reports that returned today, no smell changes.  States at this time she overall feels well but wanted to have a COVID-19 testing completed.  Denies known sick contacts.  Has mostly been remaining at home.  Denies other recent sickness.  Delsa Grana, PA-C : PCP   Past Medical History:  Diagnosis Date  . Allergic rhinitis   . Anxiety   . Chronic bronchitis ([place])   . Chronic kidney disease, stage III (moderate) 08/31/2017   Seeing nephrologist  . CKD (chronic kidney disease), stage III   . Cornea scar   . Depression    unspecified  . Diverticulosis   . Hx of fracture of left hip 12/24/2017  . Hyperlipidemia   . Hyperparathyroidism, secondary renal ([place]) 08/31/2017   Managed by nephrologist  . Hypertension   . Osteoporosis   . Risk for falls   . Situational disturbance   . TIA (transient ischemic attack)     Patient Active Problem List   Diagnosis Date Noted  . 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 Prediabetes 08/13/2018  . Major depressive disorder, recurrent ([place]) 06/13/2018  . Allergic rhinitis  09/27/2017  . Compulsive tobacco user syndrome 09/27/2017  . 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 Chronic kidney disease, stage III (moderate) 08/31/2017  . Hyperparathyroidism, secondary renal ([place]) 08/31/2017  . Atrial fibrillation ([place]) 08/09/2017  . 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 Hyperglycemia 11/10/2015  . Cardiac murmur 04/07/2015  . 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 Long term current use of anticoagulant therapy 02/10/2015  . Anxiety 01/05/2015  . Hyperlipidemia 01/05/2015  . Anticoagulation goal of INR 2 to 3 11/16/2014  . HTN (hypertension) 09/24/2014    Past Surgical History:  Procedure Laterality Date  . COLONOSCOPY  2016  . ESOPHAGUS SURGERY    . INTRAMEDULLARY (IM) NAIL INTERTROCHANTERIC Left 03/26/2017   Procedure: INTRAMEDULLARY (IM) NAIL INTERTROCHANTRIC;  Surgeon: Hessie Knows, MD;  Location: [facility] ORS;  Service: Orthopedics;  Laterality: Left;     No current facility-administered medications for this encounter.   Current Outpatient Medications:  .  acetaminophen (TYLENOL) 325 MG tablet, Take 1 tablet (325 mg total) by mouth at bedtime as needed., Disp: , Rfl:  .  albuterol (VENTOLIN HFA) 108 (90 Base) MCG/ACT inhaler, Inhale 2 puffs into the lungs every 6 (six) hours as needed for wheezing or shortness of breath., Disp: 1 Inhaler, Rfl: 2 .  aspirin 81 MG EC tablet, Take 81 mg by mouth daily. Swallow whole., Disp: , Rfl:  .  atorvastatin (LIPITOR) 10 MG tablet, Take 1 tablet (10 mg total) by mouth at bedtime., Disp: 90 tablet, Rfl: 1 .  Cholecalciferol (VITAMIN D-3) 1000 units CAPS, Take 2 capsules by mouth daily. , Disp: , Rfl:  .  Cyanocobalamin (VITAMIN B-12) 500 MCG SUBL, One tablet dissolved under the tongue once a day, Disp: 100 tablet, Rfl: 3 .  ezetimibe (ZETIA) 10 MG tablet, Take 1  tablet (10 mg total) by mouth daily., Disp: 30 tablet, Rfl: 11 .  hydrOXYzine (ATARAX/VISTARIL) 10 MG tablet, Take 1-2 tablets (10-20 mg total) by mouth every 12 (twelve) hours as needed for anxiety. (Patient taking differently: Take 10-20 mg by mouth every 12 (twelve) hours as needed for anxiety. Takes very rarely), Disp: 30 tablet, Rfl: 1 .  PARoxetine (PAXIL) 40 MG tablet, Take 1 tablet (40 mg total) by mouth every morning., Disp: 90 tablet, Rfl: 1  Allergies Cefuroxime axetil, Ciprofloxacin, and Codeine  Family History  Problem Relation Age of Onset  . Other Mother        [place] posioning  . Heart attack Father   . Prostate cancer Father   . Heart attack Brother   . Diabetes Brother   . Hypertension Daughter   . Kidney cancer Neg Hx   . Bladder Cancer Neg Hx   . Colon cancer Neg Hx   . Colon polyps Neg Hx   . Rectal cancer Neg Hx     Social History Social History   Tobacco Use  . Smoking status: Former Smoker    Packs/day: 0.25    Years: 61.00    Pack years: 15.25    Types: Cigarettes    Quit date: 03/25/2017    Years since quitting: 2.1  . Smokeless tobacco: Never Used  . Tobacco comment: smoking cessation materials not required  Substance Use Topics  . Alcohol use: No  . Drug use: No    Review of Systems Constitutional: Positive fever ENT: No sore throat. Cardiovascular: Denies chest pain. Respiratory: Denies shortness of breath. Gastrointestinal: No abdominal pain.  No nausea, no vomiting.  No diarrhea.   Genitourinary: Negative for dysuria. Musculoskeletal: Negative for atypical back pain. Skin: Negative for rash.  ____________________________________________   PHYSICAL EXAM:  VITAL SIGNS: ED Triage Vitals [05/06/19 1332]  Enc Vitals Group     BP 117/67     Pulse Rate 94     Resp 18     Temp 98.4 F (36.9 C)     Temp Source Oral     SpO2 97 %     Weight 150 lb (68 kg)     Height 5' 7.5" (1.715 m)     Head Circumference      Peak Flow       Pain Score 0     Pain Loc      Pain Edu?      Excl. in [place]?     Constitutional: Alert and oriented. Well appearing and in no acute distress. Head: Atraumatic. No sinus tenderness to palpation. No swelling. No erythema.  Ears: no erythema, normal TMs bilaterally.   Nose:No nasal congestion  Mouth/Throat: Mucous membranes are moist. No pharyngeal erythema. No tonsillar swelling or exudate.  Neck: No stridor.  No cervical spine tenderness to palpation. Hematological/Lymphatic/Immunilogical: No cervical lymphadenopathy. Cardiovascular: Normal rate, regular rhythm. Grossly normal heart sounds.  Good peripheral circulation. Respiratory: Normal respiratory effort.  No retractions. No wheezes, rales or rhonchi. Good air movement.  Musculoskeletal: Ambulatory with steady gait. Neurologic:  Normal speech  and language. No gait instability. Skin:  Skin appears warm, dry and intact. No rash noted. Psychiatric: Mood and affect are normal. Speech and behavior are normal.  ___________________________________________   LABS (all labs ordered are listed, but only abnormal results are displayed)  Labs Reviewed  NOVEL CORONAVIRUS, NAA (HOSP ORDER, SEND-OUT TO REF LAB; TAT 18-24 HRS)   ____________________________________________  PROCEDURES Discussed evaluation chest x-ray, patient declined.  INITIAL IMPRESSION / ASSESSMENT AND PLAN / ED COURSE  Pertinent labs & imaging results that were available during my care of the patient were reviewed by me and considered in my medical decision making (see chart for details).  Very well-appearing patient.  No acute distress.  Suspect viral illness.  COVID-19 testing completed and advice given.  Supportive care, rest, fluids, monitor, remain home unless seeking further care.  Discussed follow up with Primary care physician this week as needed. Discussed follow up and return parameters including no resolution or any worsening concerns. Patient verbalized  understanding and agreed to plan.   ____________________________________________   FINAL CLINICAL IMPRESSION(S) / ED DIAGNOSES  Final diagnoses:  Cough  Acute upper respiratory infection  Advice given about COVID-19 virus infection     ED Discharge Orders    None       Note: This dictation was prepared with Dragon dictation along with smaller phrase technology. Any transcriptional errors that result from this process are unintentional.         Marylene Land, NP 05/06/19 1514

## 2019-05-07 LAB — NOVEL CORONAVIRUS, NAA (HOSP ORDER, SEND-OUT TO REF LAB; TAT 18-24 HRS): SARS-CoV-2, NAA: NOT DETECTED

## 2019-05-09 ENCOUNTER — Telehealth: Payer: Self-pay | Admitting: *Deleted

## 2019-05-09 NOTE — Telephone Encounter (Signed)
Results called to pt. Pt verbalized understanding.  

## 2019-05-09 NOTE — Telephone Encounter (Signed)
No answer. Left message to call back.   

## 2019-05-09 NOTE — Telephone Encounter (Signed)
-----   Message from Kate Sable, MD sent at 05/09/2019  2:56 PM EST ----- Monitor was performed to evaluate atrial fibrillation presence. no evidence of atrial fibrillation noted.    1 run of VT VT and several supraventricular tachycardia runs noted.  Again no atrial fibrillation noted.  Patient triggered event was associated with sinus rhythm.

## 2019-05-14 ENCOUNTER — Other Ambulatory Visit: Payer: Self-pay | Admitting: Family Medicine

## 2019-05-14 NOTE — Telephone Encounter (Signed)
Requested Prescriptions  Pending Prescriptions Disp Refills  . ezetimibe (ZETIA) 10 MG tablet [Pharmacy Med Name: Ezetimibe 10 MG Oral Tablet] 30 tablet 0    Sig: Take 1 tablet by mouth once daily     Cardiovascular:  Antilipid - Sterol Transport Inhibitors Failed - 05/14/2019  1:51 PM      Failed - HDL in normal range and within 360 days    HDL Cholesterol  Date Value Ref Range Status  07/03/2014 33 (L) 40 - 60 mg/dL Final   HDL  Date Value Ref Range Status  11/12/2018 37 (L) > OR = 50 mg/dL Final  11/10/2015 46 >39 mg/dL Final         Failed - Triglycerides in normal range and within 360 days    Triglycerides  Date Value Ref Range Status  11/12/2018 171 (H) <150 mg/dL Final  07/03/2014 320 (H) 0 - 200 mg/dL Final         Passed - Total Cholesterol in normal range and within 360 days    Cholesterol, Total  Date Value Ref Range Status  11/10/2015 191 100 - 199 mg/dL Final   Cholesterol  Date Value Ref Range Status  11/12/2018 115 <200 mg/dL Final  07/03/2014 144 0 - 200 mg/dL Final         Passed - LDL in normal range and within 360 days    Ldl Cholesterol, Calc  Date Value Ref Range Status  07/03/2014 47 0 - 100 mg/dL Final   LDL Cholesterol (Calc)  Date Value Ref Range Status  11/12/2018 53 mg/dL (calc) Final    Comment:    Reference range: <100 . Desirable range <100 mg/dL for primary prevention;   <70 mg/dL for patients with CHD or diabetic patients  with > or = 2 CHD risk factors. Marland Kitchen LDL-C is now calculated using the Martin-Hopkins  calculation, which is a validated novel method providing  better accuracy than the Friedewald equation in the  estimation of LDL-C.  Cresenciano Genre et al. Annamaria Helling. 7902;409(73): 2061-2068  (http://education.[employer].com/faq/FAQ164)          Passed - Valid encounter within last 12 months    Recent Outpatient Visits          3 weeks ago Vertigo   [facility] Delsa Grana, PA-C   1 month ago  Anticoagulation goal of INR 2 to [facility] Delsa Grana, PA-C   2 months ago Anticoagulation goal of INR 2 to [facility] Delsa Grana, PA-C   3 months ago Paroxysmal atrial fibrillation [facility])   [place], NP   5 months ago Paroxysmal atrial fibrillation [facility])   [place], NP      Future Appointments            Tomorrow Kate Sable, MD [facility], [place]   In 8 months  [facility], [facility]

## 2019-05-15 ENCOUNTER — Encounter: Payer: Self-pay | Admitting: Cardiology

## 2019-05-15 ENCOUNTER — Other Ambulatory Visit: Payer: Self-pay

## 2019-05-15 ENCOUNTER — Ambulatory Visit (INDEPENDENT_AMBULATORY_CARE_PROVIDER_SITE_OTHER): Payer: Medicare Other | Admitting: Cardiology

## 2019-05-15 VITALS — BP 110/70 | HR 83 | Temp 96.8°F | Ht 67.0 in | Wt 153.5 lb

## 2019-05-15 DIAGNOSIS — R002 Palpitations: Secondary | ICD-10-CM | POA: Diagnosis not present

## 2019-05-15 DIAGNOSIS — E7800 Pure hypercholesterolemia, unspecified: Secondary | ICD-10-CM

## 2019-05-15 NOTE — Progress Notes (Signed)
Cardiology Office Note:    Date:  05/15/2019   ID:  Christine Burnett, DOB 07-01-1940, MRN [id number]  PCP:  Delsa Grana, PA-C  Cardiologist:  Kate Sable, MD  Electrophysiologist:  None   Referring MD: Delsa Grana, PA-C   Chief Complaint  Patient presents with  . office visit    F/U after wearing ZIO monitor; Meds verbally reviewed with patient.    History of Present Illness:    Christine Burnett is a 78 y.o. female, history of anxiety, former smoker x50 years, hyperlipidemia, TIA who presents for follow-up.  She was originally seen due to feeling dizzy and also dyspnea on exertion.  Lexiscan myocardial perfusion imaging stress test was performed and did not show any evidence of ischemia.  Patien denies any history of atrial fibrillation and states she takes warfarin due to history of carotid artery stenosis.  She states having rare palpitations with last occurrence about 1 to 2 months ago.  Warfarin was started years ago by a physician in [place], prior to her having any TIA symptoms.  Her neurologist stopped the warfarin and patient takes a baby aspirin.  Cardiac monitor was placed to monitor for any presence of atrial fibrillation.  She denies any history of atrial fibrillation  Past Medical History:  Diagnosis Date  . Allergic rhinitis   . Anxiety   . Chronic bronchitis ([place])   . Chronic kidney disease, stage III (moderate) 08/31/2017   Seeing nephrologist  . CKD (chronic kidney disease), stage III   . Cornea scar   . Depression    unspecified  . Diverticulosis   . Hx of fracture of left hip 12/24/2017  . Hyperlipidemia   . 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 Hypertension   . Osteoporosis   . Risk for falls   . Situational disturbance   . TIA (transient ischemic attack)     Past Surgical History:  Procedure Laterality Date  . COLONOSCOPY  2016  . ESOPHAGUS SURGERY    . INTRAMEDULLARY (IM) NAIL  INTERTROCHANTERIC Left 03/26/2017   Procedure: INTRAMEDULLARY (IM) NAIL INTERTROCHANTRIC;  Surgeon: Hessie Knows, MD;  Location: [facility] ORS;  Service: Orthopedics;  Laterality: Left;    Current Medications: Current Meds  Medication Sig  . acetaminophen (TYLENOL) 325 MG tablet Take 1 tablet (325 mg total) by mouth at bedtime as needed.  Marland Kitchen albuterol (VENTOLIN HFA) 108 (90 Base) MCG/ACT inhaler Inhale 2 puffs into the lungs every 6 (six) hours as needed for wheezing or shortness of breath.  Marland Kitchen aspirin 81 MG EC tablet Take 81 mg by mouth daily. Swallow whole.  Marland Kitchen atorvastatin (LIPITOR) 10 MG tablet Take 1 tablet (10 mg total) by mouth at bedtime.  . Cholecalciferol (VITAMIN D-3) 1000 units CAPS Take 2 capsules by mouth daily. Takes occassionally  . Cyanocobalamin (VITAMIN B-12) 500 MCG SUBL One tablet dissolved under the tongue once a day (Patient taking differently: One tablet dissolved under the tongue once a day Takes occassionally)  . ezetimibe (ZETIA) 10 MG tablet Take 1 tablet by mouth once daily  . hydrOXYzine (ATARAX/VISTARIL) 10 MG tablet Take 1-2 tablets (10-20 mg total) by mouth every 12 (twelve) hours as needed for anxiety. (Patient taking differently: Take 10-20 mg by mouth every 12 (twelve) hours as needed for anxiety. Takes very rarely)  . PARoxetine (PAXIL) 40 MG tablet Take 1 tablet (40 mg total) by mouth every morning.     Allergies:   Cefuroxime axetil, Ciprofloxacin, and Codeine  Social History   Socioeconomic History  . Marital status: Divorced    Spouse name: Not on file  . Number of children: 4  . Years of education: Not on file  . Highest education level: 12th grade  Occupational History  . Occupation: Retired  Tobacco Use  . Smoking status: Former Smoker    Packs/day: 0.25    Years: 61.00    Pack years: 15.25    Types: Cigarettes    Quit date: 03/25/2017    Years since quitting: 2.1  . Smokeless tobacco: Never Used  . Tobacco comment: smoking cessation  materials not required  Substance and Sexual Activity  . Alcohol use: No  . Drug use: No  . Sexual activity: Not Currently  Other Topics Concern  . Not on file  Social History Narrative   Full Code   Current smoker   4 children   Denies alcohol use   Social Determinants of Health   Financial Resource Strain: Low Risk   . Difficulty of Paying Living Expenses: Not hard at all  Food Insecurity: No Food Insecurity  . Worried About Charity fundraiser in the Last Year: Never true  . Ran Out of Food in the Last Year: Never true  Transportation Needs: No Transportation Needs  . Lack of Transportation (Medical): No  . Lack of Transportation (Non-Medical): No  Physical Activity: Inactive  . Days of Exercise per Week: 0 days  . Minutes of Exercise per Session: 0 min  Stress: No Stress Concern Present  . Feeling of Stress : Only a little  Social Connections: Somewhat Isolated  . Frequency of Communication with Friends and Family: More than three times a week  . Frequency of Social Gatherings with Friends and Family: More than three times a week  . Attends Religious Services: More than 4 times per year  . Active Member of Clubs or Organizations: No  . Attends Archivist Meetings: Never  . Marital Status: Divorced     Family History: The patient's family history includes Diabetes in her brother; Heart attack in her brother and father; Hypertension in her daughter; Other in her mother; Prostate cancer in her father. There is no history of Kidney cancer, Bladder Cancer, Colon cancer, Colon polyps, or Rectal cancer.  ROS:   Please see the history of present illness.     All other systems reviewed and are negative.  EKGs/Labs/Other Studies Reviewed:    The following studies were reviewed today:  2-week cardiac monitor date 03/07/2019 Patient had a min HR of 53 bpm, max HR of 187 bpm, and avg HR of 88 bpm. Predominant underlying rhythm was Sinus Rhythm. 1 run of Ventricular  Tachycardia occurred lasting 11 beats with a max rate of 148 bpm (avg 130 bpm). 23 Supraventricular Tachycardia runs occurred, the run with the fastest interval lasting 7 beats with a max rate of 187 bpm, the longest lasting 42.7 secs with an avg rate of 124 bpm. Isolated SVEs were rare (<1.0%), SVE Couplets were rare (<1.0%), and SVE Triplets were rare (<1.0%). Isolated VEs were rare (<1.0%), 1 patient triggered event was noted associated with sinus rhythm.  EKG:  EKG is  ordered today.  The ekg ordered today demonstrates normal sinus rhythm, normal ECG.  Recent Labs: 08/13/2018: TSH 2.53 01/21/2019: ALT 14; BUN 23; Creat 1.48; Hemoglobin 12.4; Platelets 209; Potassium 4.7; Sodium 139  Recent Lipid Panel    Component Value Date/Time   CHOL 115 11/12/2018 1609   CHOL  191 11/10/2015 1211   CHOL 144 07/03/2014 0526   TRIG 171 (H) 11/12/2018 1609   TRIG 320 (H) 07/03/2014 0526   HDL 37 (L) 11/12/2018 1609   HDL 46 11/10/2015 1211   HDL 33 (L) 07/03/2014 0526   CHOLHDL 3.1 11/12/2018 1609   VLDL 37 (H) 11/10/2016 1531   VLDL 64 (H) 07/03/2014 0526   LDLCALC 53 11/12/2018 1609   LDLCALC 47 07/03/2014 0526    Physical Exam:    VS:  BP 110/70 (BP Location: Left Arm, Patient Position: Sitting, Cuff Size: Normal)   Pulse 83   Temp (!) 96.8 F (36 C)   Ht 5\' 7"  (1.702 m)   Wt 153 lb 8 oz (69.6 kg)   SpO2 98%   BMI 24.04 kg/m     Wt Readings from Last 3 Encounters:  05/15/19 153 lb 8 oz (69.6 kg)  05/06/19 150 lb (68 kg)  04/17/19 152 lb 9.6 oz (69.2 kg)     GEN:  Well nourished, well developed in no acute distress HEENT: Normal NECK: No JVD; No carotid bruits LYMPHATICS: No lymphadenopathy CARDIAC: RRR, no murmurs, rubs, gallops RESPIRATORY:  Clear to auscultation without rales, wheezing or rhonchi  ABDOMEN: Soft, non-tender, non-distended MUSCULOSKELETAL:  No edema; No deformity  SKIN: Warm and dry NEUROLOGIC:  Alert and oriented x 3 PSYCHIATRIC:  Normal affect    ASSESSMENT:   Cardiac monitor with some SVTs and 1 run of VT lasting 11 beats.  No atrial fibrillation noted.  Patient is asymptomatic.  Her dizziness typically happens when she rises up from seated position could be age-related deterioration of peripheral vestibular function. Her orthostatic vitals in the office did not reveal any evidence of orthostasis.  It is not clear if patient has a diagnosis of A. fib although she states having rare occurrence of palpitations and her history of TIA's.  Palpitations likely from occasional SVTs.  No atrial fibrillation noted on cardiac monitor. 1. Palpitations   2. Pure hypercholesterolemia    PLAN:      1.  Cardiac monitor with occasional SVTs no atrial fibrillation.  Reasonable for patient to continue on 81 mg aspirin daily due to history of TIAs.  2.  Continue Lipitor as currently prescribed  Total encounter time more than 30  minutes  Greater than 50% was spent in counseling and coordination of care with the patient   This note was generated in part or whole with voice recognition software. Voice recognition is usually quite accurate but there are transcription errors that can and very often do occur. I apologize for any typographical errors that were not detected and corrected.   Medication Adjustments/Labs and Tests Ordered: Current medicines are reviewed at length with the patient today.  Concerns regarding medicines are outlined above.  Orders Placed This Encounter  Procedures  . EKG 12-Lead   No orders of the defined types were placed in this encounter.   Patient Instructions  Medication Instructions:  - Your physician recommends that you continue on your current medications as directed. Please refer to the Current Medication list given to you today.  *If you need a refill on your cardiac medications before your next appointment, please call your pharmacy*  Lab Work: - none ordered  If you have labs (blood work) drawn today  and your tests are completely normal, you will receive your results only by: Marland Kitchen MyChart Message (if you have MyChart) OR . A paper copy in the mail If you have any lab  test that is abnormal or we need to change your treatment, we will call you to review the results.  Testing/Procedures: -none ordered  Follow-Up: At [facility], you and your health needs are our priority.  As part of our continuing mission to provide you with exceptional heart care, we have created designated Provider Care Teams.  These Care Teams include your primary Cardiologist (physician) and Advanced Practice Providers (APPs -  Physician Assistants and Nurse Practitioners) who all work together to provide you with the care you need, when you need it.  Your next appointment:   as needed   The format for your next appointment:   as needed  Provider:   Kate Sable, MD  Other Instructions n/a     Signed, Kate Sable, MD  05/15/2019 10:52 AM    [place]

## 2019-05-15 NOTE — Patient Instructions (Signed)
Medication Instructions:  - Your physician recommends that you continue on your current medications as directed. Please refer to the Current Medication list given to you today.  *If you need a refill on your cardiac medications before your next appointment, please call your pharmacy*  Lab Work: - none ordered  If you have labs (blood work) drawn today and your tests are completely normal, you will receive your results only by: Marland Kitchen MyChart Message (if you have MyChart) OR . A paper copy in the mail If you have any lab test that is abnormal or we need to change your treatment, we will call you to review the results.  Testing/Procedures: -none ordered  Follow-Up: At [facility], you and your health needs are our priority.  As part of our continuing mission to provide you with exceptional heart care, we have created designated Provider Care Teams.  These Care Teams include your primary Cardiologist (physician) and Advanced Practice Providers (APPs -  Physician Assistants and Nurse Practitioners) who all work together to provide you with the care you need, when you need it.  Your next appointment:   as needed   The format for your next appointment:   as needed  Provider:   Kate Sable, MD  Other Instructions n/a

## 2019-05-21 NOTE — Addendum Note (Signed)
Addended by: Alvis Lemmings C on: 05/21/2019 02:06 PM   Modules accepted: Orders

## 2019-05-21 NOTE — Addendum Note (Signed)
Addended by: Alvis Lemmings C on: 05/21/2019 02:07 PM   Modules accepted: Orders

## 2019-06-10 ENCOUNTER — Other Ambulatory Visit: Payer: Self-pay | Admitting: Family Medicine

## 2019-06-12 ENCOUNTER — Other Ambulatory Visit: Payer: Self-pay

## 2019-06-12 DIAGNOSIS — E782 Mixed hyperlipidemia: Secondary | ICD-10-CM

## 2019-06-12 DIAGNOSIS — F330 Major depressive disorder, recurrent, mild: Secondary | ICD-10-CM

## 2019-06-16 MED ORDER — PAROXETINE HCL 40 MG PO TABS: 40.0000 mg | ORAL_TABLET | ORAL | 1 refills | Status: DC

## 2019-06-16 MED ORDER — ATORVASTATIN CALCIUM 10 MG PO TABS: 10.0000 mg | ORAL_TABLET | Freq: Every day | ORAL | 1 refills | Status: DC

## 2019-06-27 ENCOUNTER — Ambulatory Visit (INDEPENDENT_AMBULATORY_CARE_PROVIDER_SITE_OTHER): Payer: Medicare Other | Admitting: Family Medicine

## 2019-06-27 ENCOUNTER — Encounter: Payer: Self-pay | Admitting: Family Medicine

## 2019-06-27 ENCOUNTER — Other Ambulatory Visit: Payer: Self-pay

## 2019-06-27 VITALS — BP 116/82 | HR 102 | Temp 97.1°F | Resp 20 | Ht 67.0 in | Wt 151.1 lb

## 2019-06-27 DIAGNOSIS — M545 Low back pain, unspecified: Secondary | ICD-10-CM

## 2019-06-27 DIAGNOSIS — Z23 Encounter for immunization: Secondary | ICD-10-CM | POA: Diagnosis not present

## 2019-06-27 MED ORDER — KETOROLAC TROMETHAMINE 60 MG/2ML IM SOLN
60.0000 mg | Freq: Once | INTRAMUSCULAR | Status: AC
  Administered 2019-06-27: 30 mg via INTRAMUSCULAR

## 2019-06-27 MED ORDER — CYCLOBENZAPRINE HCL 5 MG PO TABS: 5.0000 mg | ORAL_TABLET | Freq: Three times a day (TID) | ORAL | 0 refills | Status: DC | PRN

## 2019-06-27 MED ORDER — NAPROXEN 500 MG PO TABS: 500.0000 mg | ORAL_TABLET | Freq: Two times a day (BID) | ORAL | 0 refills | Status: DC

## 2019-06-27 MED ORDER — DEXAMETHASONE SODIUM PHOSPHATE 100 MG/10ML IJ SOLN
10.0000 mg | Freq: Once | INTRAMUSCULAR | Status: AC
  Administered 2019-06-27: 10 mg via INTRAMUSCULAR

## 2019-06-27 NOTE — Patient Instructions (Addendum)
Try the muscle relaxers as needed over the next 1-2 weeks  Take naproxen 500 mg twice a day for the next week.  You can use tylenol [phone] mg up to three times a day for pain  Use frequency heat therapy (see below)    Lumbar Strain A lumbar strain, which is sometimes called a low-back strain, is a stretch or tear in a muscle or the strong cords of tissue that attach muscle to bone (tendons) in the lower back (lumbar spine). This type of injury occurs when muscles or tendons are torn or are stretched beyond their limits. Lumbar strains can range from mild to severe. Mild strains may involve stretching a muscle or tendon without tearing it. These may heal in 1-2 weeks. More severe strains involve tearing of muscle fibers or tendons. These will cause more pain and may take 6-8 weeks to heal. What are the causes? This condition may be caused by:  Trauma, such as a fall or a hit to the body.  Twisting or overstretching the back. This may result from doing activities that need a lot of energy, such as lifting heavy objects. What increases the risk? This injury is more common in:  Athletes.  People with obesity.  People who do repeated lifting, bending, or other movements that involve their back. What are the signs or symptoms? Symptoms of this condition may include:  Sharp or dull pain in the lower back that does not go away. The pain may extend to the buttocks.  Stiffness or limited range of motion.  Sudden muscle tightening (spasms). How is this diagnosed? This condition may be diagnosed based on:  Your symptoms.  Your medical history.  A physical exam.  Imaging tests, such as: ? X-rays. ? MRI. How is this treated? Treatment for this condition may include:  Rest.  Applying heat and cold to the affected area.  Over-the-counter medicines to help relieve pain and inflammation, such as NSAIDs.  Prescription pain medicine and muscle relaxants may be needed for a short  time.  Physical therapy. Follow these instructions at home: Managing pain, stiffness, and swelling      If directed, put ice on the injured area during the first 24 hours after your injury. ? Put ice in a plastic bag. ? Place a towel between your skin and the bag. ? Leave the ice on for 20 minutes, 2-3 times a day.  If directed, apply heat to the affected area as often as told by your health care provider. Use the heat source that your health care provider recommends, such as a moist heat pack or a heating pad. ? Place a towel between your skin and the heat source. ? Leave the heat on for 20-30 minutes. ? Remove the heat if your skin turns bright red. This is especially important if you are unable to feel pain, heat, or cold. You may have a greater risk of getting burned. Activity  Rest and return to your normal activities as told by your health care provider. Ask your health care provider what activities are safe for you.  Do exercises as told by your health care provider. Medicines  Take over-the-counter and prescription medicines only as told by your health care provider.  Ask your health care provider if the medicine prescribed to you: ? Requires you to avoid driving or using heavy machinery. ? Can cause constipation. You may need to take these actions to prevent or treat constipation:  Drink enough fluid to keep your  urine pale yellow.  Take over-the-counter or prescription medicines.  Eat foods that are high in fiber, such as beans, whole grains, and fresh fruits and vegetables.  Limit foods that are high in fat and processed sugars, such as fried or sweet foods. Injury prevention To prevent a future low-back injury:  Always warm up properly before physical activity or sports.  Cool down and stretch after being active.  Use correct form when playing sports and lifting heavy objects. Bend your knees before you lift heavy objects.  Use good posture when sitting and  standing.  Stay physically fit and keep a healthy weight. ? Do at least 150 minutes of moderate-intensity exercise each week, such as brisk walking or water aerobics. ? Do strength exercises at least 2 times each week.  General instructions  Do not use any products that contain nicotine or tobacco, such as cigarettes, e-cigarettes, and chewing tobacco. If you need help quitting, ask your health care provider.  Keep all follow-up visits as told by your health care provider. This is important. Contact a health care provider if:  Your back pain does not improve after 6 weeks of treatment.  Your symptoms get worse. Get help right away if:  Your back pain is severe.  You are unable to stand or walk.  You develop pain in your legs.  You develop weakness in your buttocks or legs.  You have difficulty controlling when you urinate or when you have a bowel movement. ? You have frequent, painful, or bloody urination. ? You have a temperature over 101.78F (38.3C) Summary  A lumbar strain, which is sometimes called a low-back strain, is a stretch or tear in a muscle or the strong cords of tissue that attach muscle to bone (tendons) in the lower back (lumbar spine).  This type of injury occurs when muscles or tendons are torn or are stretched beyond their limits.  Rest and return to your normal activities as told by your health care provider. If directed, apply heat and ice to the affected area as often as told by your health care provider.  Take over-the-counter and prescription medicines only as told by your health care provider.  Contact a health care provider if you have new or worsening symptoms. This information is not intended to replace advice given to you by your health care provider. Make sure you discuss any questions you have with your health care provider. Document Revised: 03/21/2018 Document Reviewed: 03/21/2018 Elsevier Patient Education  [place] therapy can help ease sore, stiff, injured, and tight muscles and joints. Heat relaxes your muscles, which may help ease your pain and muscle spasms. Do not use heat therapy unless your doctor tells you to use it. How to use heat therapy There are several different kinds of heat therapy, including:  Moist heat pack.  Hot water bottle.  Electric heating pad.  Heated gel pack.  Heated wrap.  Warm water bath. Your doctor will tell you how to use heat therapy. In general, you should: 1. Place a towel between your skin and the heat source. 2. Leave the heat on for 20-30 minutes. Your skin may turn pink. 3. Remove the heat if your skin turns bright red. You should remove the heat source if you are unable to feel pain, heat, or cold. You are more likely to get burned if you leave it on the skin for too long. Your doctor may also tell you to take  a warm water bath. To do this: 1. Put a non-slip pad in the bathtub to prevent a fall. 2. Fill the bathtub with warm water. 3. Check the water temperature. 4. Soak in the water for 15-20 minutes, or as told by your doctor. 5. Be careful when you stand up after the bath. You may feel dizzy. 6. Pat yourself dry after the bath. Do not rub your skin to dry it. General recommendations for heat therapy  Do not sleep while using heat therapy. Only use heat therapy while you are awake.  Your skin may turn pink while using heat therapy. Do not use heat therapy if your skin turns red.  Do not use heat therapy if you have a new injury.  High heat or using heat for a long time can cause burns. Be careful not to burn your skin when using heat therapy.  Do not use heat therapy on areas of your skin that are already irritated, such as with a rash or sunburn. Get help if you have:  Blisters, redness, swelling (puffiness), or numbness.  New pain.  Pain that is getting worse. Summary  Heat therapy is the use of heat to help ease sore,  stiff, injured, and tight muscles and joints.  There are different types of heat therapy. Your doctor will tell you which one to use.  Only use heat therapy while you are awake.  Watch your skin to make sure you do not get burned while using heat therapy. This information is not intended to replace advice given to you by your health care provider. Make sure you discuss any questions you have with your health care provider. Document Revised: 07/15/2018 Document Reviewed: 06/02/2017 Elsevier Patient Education  2020 [employer].

## 2019-06-27 NOTE — Progress Notes (Signed)
Patient ID: Christine Burnett, female    DOB: March 05, 1941, 79 y.o.   MRN: [id number]  PCP: Delsa Grana, PA-C  Chief Complaint  Patient presents with  . Back Pain    onset 1 week, was moving a dresser    Subjective:   Christine Burnett is a 79 y.o. female, presents to clinic with CC of the following:  Back Pain This is a new problem. The current episode started in the past 7 days. The problem occurs constantly. The problem has been gradually worsening since onset. The pain is present in the sacro-iliac and lumbar spine. The quality of the pain is described as stabbing, shooting and cramping (severe spasms to low back). The pain does not radiate. The pain is severe. Exacerbated by: movement position changes  Pertinent negatives include no abdominal pain, dysuria or weakness. She has tried NSAIDs for the symptoms.      Patient Active Problem List   Diagnosis Date Noted  . 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 Prediabetes 08/13/2018  . Major depressive disorder, recurrent ([place]) 06/13/2018  . Allergic rhinitis 09/27/2017  . Compulsive tobacco user syndrome 09/27/2017  . 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 Chronic kidney disease, stage III (moderate) 08/31/2017  . 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 Atrial fibrillation ([place]) 08/09/2017  . 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 Hyperglycemia 11/10/2015  . Cardiac murmur 04/07/2015  . 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 Long term current use of anticoagulant therapy 02/10/2015  . Anxiety 01/05/2015  . Hyperlipidemia 01/05/2015  . Anticoagulation goal of INR 2 to 3 11/16/2014  . HTN (hypertension) 09/24/2014      Current Outpatient Medications:  .  acetaminophen (TYLENOL) 325 MG tablet, Take 1 tablet (325 mg total) by mouth at bedtime as  needed., Disp: , Rfl:  .  albuterol (VENTOLIN HFA) 108 (90 Base) MCG/ACT inhaler, Inhale 2 puffs into the lungs every 6 (six) hours as needed for wheezing or shortness of breath., Disp: 1 Inhaler, Rfl: 2 .  aspirin 81 MG EC tablet, Take 81 mg by mouth daily. Swallow whole., Disp: , Rfl:  .  atorvastatin (LIPITOR) 10 MG tablet, Take 1 tablet (10 mg total) by mouth at bedtime., Disp: 90 tablet, Rfl: 1 .  Cholecalciferol (VITAMIN D-3) 1000 units CAPS, Take 2 capsules by mouth daily. Takes occassionally, Disp: , Rfl:  .  Cyanocobalamin (VITAMIN B-12) 500 MCG SUBL, One tablet dissolved under the tongue once a day (Patient taking differently: One tablet dissolved under the tongue once a day Takes occassionally), Disp: 100 tablet, Rfl: 3 .  ezetimibe (ZETIA) 10 MG tablet, Take 1 tablet by mouth once daily (Patient not taking: Reported on 06/27/2019), Disp: 30 tablet, Rfl: 11 .  hydrOXYzine (ATARAX/VISTARIL) 10 MG tablet, Take 1-2 tablets (10-20 mg total) by mouth every 12 (twelve) hours as needed for anxiety. (Patient not taking: Reported on 06/27/2019), Disp: 30 tablet, Rfl: 1 .  PARoxetine (PAXIL) 40 MG tablet, Take 1 tablet (40 mg total) by mouth every morning. (Patient not taking: Reported on 06/27/2019), Disp: 90 tablet, Rfl: 1   Allergies  Allergen Reactions  . Cefuroxime Axetil     caused rash.  . Ciprofloxacin     Unknown  . Codeine      Family History  Problem Relation Age of Onset  . Other Mother  [place] posioning  . Heart attack Father   . Prostate cancer Father   . Heart attack Brother   . Diabetes Brother   . Hypertension Daughter   . Kidney cancer Neg Hx   . Bladder Cancer Neg Hx   . Colon cancer Neg Hx   . Colon polyps Neg Hx   . Rectal cancer Neg Hx      Social History   Socioeconomic History  . Marital status: Divorced    Spouse name: Not on file  . Number of children: 4  . Years of education: Not on file  . Highest education level: 12th grade  Occupational  History  . Occupation: Retired  Tobacco Use  . Smoking status: Former Smoker    Packs/day: 0.25    Years: 61.00    Pack years: 15.25    Types: Cigarettes    Quit date: 03/25/2017    Years since quitting: 2.2  . Smokeless tobacco: Never Used  . Tobacco comment: smoking cessation materials not required  Substance and Sexual Activity  . Alcohol use: No  . Drug use: No  . Sexual activity: Not Currently  Other Topics Concern  . Not on file  Social History Narrative   Full Code   Current smoker   4 children   Denies alcohol use   Social Determinants of Health   Financial Resource Strain:   . Difficulty of Paying Living Expenses: Not on file  Food Insecurity:   . Worried About Charity fundraiser in the Last Year: Not on file  . Ran Out of Food in the Last Year: Not on file  Transportation Needs:   . Lack of Transportation (Medical): Not on file  . Lack of Transportation (Non-Medical): Not on file  Physical Activity:   . Days of Exercise per Week: Not on file  . Minutes of Exercise per Session: Not on file  Stress: No Stress Concern Present  . Feeling of Stress : Only a little  Social Connections: Somewhat Isolated  . Frequency of Communication with Friends and Family: More than three times a week  . Frequency of Social Gatherings with Friends and Family: More than three times a week  . Attends Religious Services: More than 4 times per year  . Active Member of Clubs or Organizations: No  . Attends Archivist Meetings: Never  . Marital Status: Divorced  Human resources officer Violence:   . Fear of Current or Ex-Partner: Not on file  . Emotionally Abused: Not on file  . Physically Abused: Not on file  . Sexually Abused: Not on file    Chart Review Today: I personally reviewed active problem list, medication list, allergies, family history, social history, health maintenance, notes from last encounter, lab results, imaging with the patient/caregiver today.   Review  of Systems  Constitutional: Negative.  Negative for fatigue.  HENT: Negative.   Eyes: Negative.   Respiratory: Negative.   Cardiovascular: Negative.   Gastrointestinal: Negative.  Negative for abdominal pain, diarrhea and vomiting.  Endocrine: Negative.   Genitourinary: Negative.  Negative for decreased urine volume, dysuria and frequency.  Musculoskeletal: Positive for back pain. Negative for arthralgias.  Skin: Negative.  Negative for color change.  Allergic/Immunologic: Negative.   Neurological: Negative.  Negative for weakness.  Hematological: Negative.   Psychiatric/Behavioral: Negative.   All other systems reviewed and are negative.      Objective:   Vitals:   06/27/19 1346  BP: 116/82  Pulse: (!) 102  Resp:  20  Temp: (!) 97.1 F (36.2 C)  TempSrc: Temporal  SpO2: 95%  Weight: 151 lb 1.6 oz (68.5 kg)  Height: 5\' 7"  (1.702 m)    Body mass index is 23.67 kg/m.  Physical Exam Vitals and nursing note reviewed.  Constitutional:      Appearance: She is well-developed.  HENT:     Head: Normocephalic and atraumatic.     Nose: Nose normal.  Eyes:     General:        Right eye: No discharge.        Left eye: No discharge.     Conjunctiva/sclera: Conjunctivae normal.  Neck:     Trachea: No tracheal deviation.  Cardiovascular:     Rate and Rhythm: Normal rate and regular rhythm.  Pulmonary:     Effort: Pulmonary effort is normal. No respiratory distress.     Breath sounds: No stridor.  Musculoskeletal:     Comments: No midline tenderness from cervical to lumbar spine, no step off No paraspinal muscle ttp from cervical to thoracic spine, mild generalized ttp to lumbosacral spine and to SI joints b/l  Decreased ROM of back, difficulty moving from chair to standing 5/5 strength bilaterally with dorsiflexion, plantarflexion, flexion and extension at knees, and flexion and extension at hips Grossly normal sensation to light touch to bilateral lower  extremities antalgic gait   Skin:    General: Skin is warm and dry.     Findings: No rash.  Neurological:     Mental Status: She is alert.     Motor: No abnormal muscle tone.     Coordination: Coordination normal.  Psychiatric:        Behavior: Behavior normal.      Results for orders placed or performed during the hospital encounter of 05/06/19  Novel Coronavirus, NAA (Hosp order, Send-out to Ref Lab; TAT 18-24 hrs   Specimen: Nasopharyngeal Swab; Respiratory  Result Value Ref Range   SARS-CoV-2, NAA NOT DETECTED NOT DETECTED   Coronavirus Source NASOPHARYNGEAL         Assessment & Plan:      ICD-10-CM   1. Acute midline low back pain, unspecified whether sciatica present  M54.5 naproxen (NAPROSYN) 500 MG tablet    cyclobenzaprine (FLEXERIL) 5 MG tablet    ketorolac (TORADOL) injection 60 mg    dexamethasone (DECADRON) injection 10 mg   acute low back strain, toradol and decadron given in clinic due to her severe pain and limited ROM and mobility, NSAIDS, heating pad, muscle relaxers  2. Need for influenza vaccination  Z23 Flu Vaccine QUAD High Dose(Fluad)    No red flags Encouraged conservative tx and close f/up would want her to go to PT if not gradually improving in the next 2-6 weeks.    Delsa Grana, PA-C 06/27/19 2:10 PM

## 2019-07-10 ENCOUNTER — Ambulatory Visit: Payer: Medicare Other | Admitting: Podiatry

## 2019-07-29 ENCOUNTER — Ambulatory Visit (INDEPENDENT_AMBULATORY_CARE_PROVIDER_SITE_OTHER): Payer: Medicare Other | Admitting: Family Medicine

## 2019-07-29 ENCOUNTER — Encounter: Payer: Self-pay | Admitting: Family Medicine

## 2019-07-29 ENCOUNTER — Other Ambulatory Visit: Payer: Self-pay

## 2019-07-29 VITALS — BP 124/78 | HR 105 | Temp 97.8°F | Resp 20 | Ht 67.0 in | Wt 150.5 lb

## 2019-07-29 DIAGNOSIS — N1832 Chronic kidney disease, stage 3b: Secondary | ICD-10-CM | POA: Diagnosis not present

## 2019-07-29 DIAGNOSIS — N179 Acute kidney failure, unspecified: Secondary | ICD-10-CM

## 2019-07-29 DIAGNOSIS — F330 Major depressive disorder, recurrent, mild: Secondary | ICD-10-CM | POA: Diagnosis not present

## 2019-07-29 DIAGNOSIS — N2581 Secondary hyperparathyroidism of renal origin: Secondary | ICD-10-CM | POA: Diagnosis not present

## 2019-07-29 DIAGNOSIS — R300 Dysuria: Secondary | ICD-10-CM | POA: Diagnosis not present

## 2019-07-29 LAB — POCT URINALYSIS DIPSTICK
Bilirubin, UA: NEGATIVE
Blood, UA: NEGATIVE
Glucose, UA: NEGATIVE
Ketones, UA: NEGATIVE
Leukocytes, UA: NEGATIVE
Nitrite, UA: NEGATIVE
Odor: NORMAL
Protein, UA: NEGATIVE
Spec Grav, UA: 1.02 (ref 1.010–1.025)
Urobilinogen, UA: 0.2 E.U./dL
pH, UA: 5.5 (ref 5.0–8.0)

## 2019-07-29 MED ORDER — FLUCONAZOLE 150 MG PO TABS: 150.0000 mg | ORAL_TABLET | ORAL | 0 refills | Status: DC | PRN

## 2019-07-29 MED ORDER — CEPHALEXIN 500 MG PO CAPS: 500.0000 mg | ORAL_CAPSULE | Freq: Three times a day (TID) | ORAL | 0 refills | Status: DC

## 2019-07-29 NOTE — Progress Notes (Signed)
Patient ID: Christine Burnett, female    DOB: 04-Jul-1940, 79 y.o.   MRN: [id number]  PCP: Delsa Grana, PA-C  Chief Complaint  Patient presents with  . Urinary Tract Infection  . back ache  . Burning with urination    Subjective:   Christine Burnett is a 79 y.o. female, presents to clinic with CC of the following:  Back pain improved after last visit -she is no longer having pain, difficulty with range of motion or muscle spasms  She presents with suspected UTI and with concerns of worsening mood, depression and agitation.  She states that urinary symptoms began 5 days ago states that she has dysuria described as burning and urine odor symptoms are coming and going since last Thursday.  Pain is moderate, intermittent, she is not tried any medications or over-the-counter treatments, no other aggravating or alleviating factors. She endorses associated decreased appetite, mild intermittent nausea, also has some abdominal bloated and gassiness. she denies associated hematuria, abd/pelvic pain, flank pain, urinary frequency/urgency, vaginal symptoms.    BMs infrequent not regular, sometimes loose but no change from her normal except increased gas, she tried gas-ex but it didn't agree with her at all - states she couldn't breath  Ua from today reviewed with the pt, unremarkable - sent for culture.  Patient does have chronic kidney disease she has no history of recurrent UTI, kidney stones or kidney infections.  She is not immunocompromise  Results for orders placed or performed in visit on 07/29/19  POCT urinalysis dipstick  Result Value Ref Range   Color, UA yellow    Clarity, UA clear    Glucose, UA Negative Negative   Bilirubin, UA neg    Ketones, UA neg    Spec Grav, UA 1.020 1.010 - 1.025   Blood, UA neg    pH, UA 5.5 5.0 - 8.0   Protein, UA Negative Negative   Urobilinogen, UA 0.2 0.2 or 1.0 E.U./dL   Nitrite, UA neg    Leukocytes, UA Negative Negative   Appearance  clear    Odor normal    She also begins to burst into tears during the visit and states that she does need to restart her Paxil.  At her last visit she discontinued on her own and felt very good about this decision and her moods were also good at that time her daughter was with her and we were discussing her back pain.  She states that she never picked up a refill but over the past several weeks she has been crying for no reason at all she states that she is been mean to people.  Her daughter was in a come to visit her and she called her in tears asking her not to come because she did not want to say anything to her that would be meeting her that would upset her daughter.  Depression screen [facility] 2/9 07/29/2019 06/27/2019 04/17/2019 03/17/2019 02/18/2019  Decreased Interest 2 0 0 0 0  Down, Depressed, Hopeless 2 0 0 0 0  PHQ - 2 Score 4 0 0 0 0  Altered sleeping 1 1 0 0 0  Tired, decreased energy 0 0 0 0 0  Change in appetite 0 0 0 0 0  Feeling bad or failure about yourself  0 0 0 0 0  Trouble concentrating 0 0 0 0 0  Moving slowly or fidgety/restless 0 0 0 0 0  Suicidal thoughts 0 0 0 0 0  PHQ-9  Score 5 1 0 0 0  Difficult doing work/chores Not difficult at all Not difficult at all Not difficult at all Not difficult at all Not difficult at all  Some recent data might be hidden    Patient Active Problem List   Diagnosis Date Noted  . Prediabetes 08/13/2018  . Major depressive disorder, recurrent ([place]) 06/13/2018  . Allergic rhinitis 09/27/2017  . 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 Chronic kidney disease, stage III (moderate) 08/31/2017  . Hyperparathyroidism, secondary renal ([place]) 08/31/2017  . 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 Cardiac murmur 04/07/2015  . 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 Anxiety 01/05/2015  .  Hyperlipidemia 01/05/2015  . HTN (hypertension) 09/24/2014      Current Outpatient Medications:  .  acetaminophen (TYLENOL) 325 MG tablet, Take 1 tablet (325 mg total) by mouth at bedtime as needed., Disp: , Rfl:  .  albuterol (VENTOLIN HFA) 108 (90 Base) MCG/ACT inhaler, Inhale 2 puffs into the lungs every 6 (six) hours as needed for wheezing or shortness of breath., Disp: 1 Inhaler, Rfl: 2 .  aspirin 81 MG EC tablet, Take 81 mg by mouth daily. Swallow whole., Disp: , Rfl:  .  atorvastatin (LIPITOR) 10 MG tablet, Take 1 tablet (10 mg total) by mouth at bedtime., Disp: 90 tablet, Rfl: 1 .  Cholecalciferol (VITAMIN D-3) 1000 units CAPS, Take 2 capsules by mouth daily. Takes occassionally, Disp: , Rfl:  .  Cyanocobalamin (VITAMIN B-12) 500 MCG SUBL, One tablet dissolved under the tongue once a day (Patient taking differently: One tablet dissolved under the tongue once a day Takes occassionally), Disp: 100 tablet, Rfl: 3 .  cyclobenzaprine (FLEXERIL) 5 MG tablet, Take 1 tablet (5 mg total) by mouth 3 (three) times daily as needed for muscle spasms. (Patient not taking: Reported on 07/29/2019), Disp: 30 tablet, Rfl: 0 .  ezetimibe (ZETIA) 10 MG tablet, Take 1 tablet by mouth once daily (Patient not taking: Reported on 07/29/2019), Disp: 30 tablet, Rfl: 11 .  naproxen (NAPROSYN) 500 MG tablet, Take 1 tablet (500 mg total) by mouth 2 (two) times daily with a meal. (Patient not taking: Reported on 07/29/2019), Disp: 30 tablet, Rfl: 0   Allergies  Allergen Reactions  . Cefuroxime Axetil     caused rash.  . Ciprofloxacin     Unknown  . Codeine      Family History  Problem Relation Age of Onset  . Other Mother        [place] posioning  . Heart attack Father   . Prostate cancer Father   . Heart attack Brother   . Diabetes Brother   . Hypertension Daughter   . Kidney cancer Neg Hx   . Bladder Cancer Neg Hx   . Colon cancer Neg Hx   . Colon polyps Neg Hx   . Rectal cancer Neg Hx       Social History   Socioeconomic History  . Marital status: Divorced    Spouse name: Not on file  . Number of children: 4  . Years of education: Not on file  . Highest education level: 12th grade  Occupational History  . Occupation: Retired  Tobacco Use  . Smoking status: Former Smoker    Packs/day: 0.25    Years: 61.00    Pack years: 15.25    Types: Cigarettes    Quit date: 03/25/2017  Years since quitting: 2.3  . Smokeless tobacco: Never Used  . Tobacco comment: smoking cessation materials not required  Substance and Sexual Activity  . Alcohol use: No  . Drug use: No  . Sexual activity: Not Currently  Other Topics Concern  . Not on file  Social History Narrative   Full Code   Current smoker   4 children   Denies alcohol use   Social Determinants of Health   Financial Resource Strain:   . Difficulty of Paying Living Expenses: Not on file  Food Insecurity:   . Worried About Charity fundraiser in the Last Year: Not on file  . Ran Out of Food in the Last Year: Not on file  Transportation Needs:   . Lack of Transportation (Medical): Not on file  . Lack of Transportation (Non-Medical): Not on file  Physical Activity:   . Days of Exercise per Week: Not on file  . Minutes of Exercise per Session: Not on file  Stress: No Stress Concern Present  . Feeling of Stress : Only a little  Social Connections: Somewhat Isolated  . Frequency of Communication with Friends and Family: More than three times a week  . Frequency of Social Gatherings with Friends and Family: More than three times a week  . Attends Religious Services: More than 4 times per year  . Active Member of Clubs or Organizations: No  . Attends Archivist Meetings: Never  . Marital Status: Divorced  Human resources officer Violence:   . Fear of Current or Ex-Partner: Not on file  . Emotionally Abused: Not on file  . Physically Abused: Not on file  . Sexually Abused: Not on file    Chart Review  Today: I personally reviewed active problem list, medication list, allergies, family history, social history, health maintenance, notes from last encounter, lab results, imaging with the patient/caregiver today. Reviewed last labs, renal function, meds  Review of Systems 10 Systems reviewed and are negative for acute change except as noted in the HPI.     Objective:   Vitals:   07/29/19 1522  BP: 124/78  Pulse: (!) 105  Resp: 20  Temp: 97.8 F (36.6 C)  TempSrc: Temporal  SpO2: 97%  Weight: 150 lb 8 oz (68.3 kg)  Height: 5\' 7"  (1.702 m)    Body mass index is 23.57 kg/m.  Physical Exam Vitals and nursing note reviewed.  Constitutional:      Appearance: She is well-developed.  HENT:     Head: Normocephalic and atraumatic.  Eyes:     General:        Right eye: No discharge.        Left eye: No discharge.     Conjunctiva/sclera: Conjunctivae normal.  Neck:     Trachea: No tracheal deviation.  Cardiovascular:     Rate and Rhythm: Normal rate and regular rhythm.     Pulses: Normal pulses.     Heart sounds: Normal heart sounds. No murmur. No friction rub. No gallop.   Pulmonary:     Effort: Pulmonary effort is normal. No respiratory distress.     Breath sounds: Normal breath sounds. No stridor. No wheezing, rhonchi or rales.  Abdominal:     General: Bowel sounds are normal. There is no distension.     Palpations: Abdomen is soft.     Tenderness: There is no abdominal tenderness. There is no right CVA tenderness, left CVA tenderness, guarding or rebound.     Comments: abd soft  NDND, normal BS x 4  Musculoskeletal:        General: Normal range of motion.  Skin:    General: Skin is warm and dry.     Findings: No rash.  Neurological:     Mental Status: She is alert.     Motor: No abnormal muscle tone.     Coordination: Coordination normal.  Psychiatric:        Attention and Perception: Attention normal.        Mood and Affect: Mood is anxious and depressed. Affect is  tearful.        Speech: Speech normal.        Behavior: Behavior normal. Behavior is cooperative.        Thought Content: Thought content does not include suicidal ideation. Thought content does not include suicidal plan.      Results for orders placed or performed in visit on 07/29/19  POCT urinalysis dipstick  Result Value Ref Range   Color, UA yellow    Clarity, UA clear    Glucose, UA Negative Negative   Bilirubin, UA neg    Ketones, UA neg    Spec Grav, UA 1.020 1.010 - 1.025   Blood, UA neg    pH, UA 5.5 5.0 - 8.0   Protein, UA Negative Negative   Urobilinogen, UA 0.2 0.2 or 1.0 E.U./dL   Nitrite, UA neg    Leukocytes, UA Negative Negative   Appearance clear    Odor normal         Assessment & Plan:      ICD-10-CM   1. Dysuria  R30.0 POCT urinalysis dipstick    Urine Culture    cephALEXin (KEFLEX) 500 MG capsule    fluconazole (DIFLUCAN) 150 MG tablet   Sx consistent with UTI though UA dip her is unremarkable, since she has decreased appetite and urinary sx will tx for UTI, urine culture added due to age  74. Mild episode of recurrent major depressive disorder (HCC)  F33.0    worse moods and agitation since she took herself off paxil 2 months ago, tearful in clinic, no SI, restart meds, 1/2 dose for 1-2 weeks then increase to 40 mg   3. 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 Acute renal failure superimposed on stage 3b chronic kidney disease, unspecified acute renal failure type ([place])  N17.9    N18.32    be nephrology     Discussed with the patient the options to wait for urine culture versus starting antibiotics today even though the UA is unremarkable.  The patient states that she would prefer to start treatment and we discussed the risk of unneeded antibiotics and she would rather start treatment and hopefully start feeling better rather than wait and feel worse.  Did Keflex did look up renal dosing will do 500 mg 3 times  daily.  Patient does get a yeast infection when taking antibiotics so she requested Diflucan also be called in for her  Patient's PHQ was mildly positive and patient was extremely tearful in exam room states that she is very agitated snappy and being "mean" and she does need to get back on her Paxil.  She was on 40 mg of Paxil daily and took her self off 2 months ago, she states that she will get the medications that are still at the pharmacy and restart them.  I encouraged her to start with 1/2 tablet, 20 mg, daily for the  first 1 to 2 weeks before increasing up to her full dose of 40 mg.  We discussed GI symptoms but she may experience when restarting.  She did agree to doing a close follow-up in about 6 weeks to recheck her moods.   Delsa Grana, PA-C 07/29/19 3:57 PM

## 2019-07-29 NOTE — Patient Instructions (Addendum)
Restart your paxil - just start at lower dose 20 mg once daily for 1-2 weeks and then increase back up to your 40 mg dose daily  Take the antibiotic as prescribed and the yeast medicine as needed  Please follow up if not feeling better.

## 2019-07-30 LAB — URINE CULTURE
MICRO NUMBER:: 10179686
SPECIMEN QUALITY:: ADEQUATE

## 2019-08-28 DIAGNOSIS — N1832 Chronic kidney disease, stage 3b: Secondary | ICD-10-CM | POA: Diagnosis not present

## 2019-08-28 DIAGNOSIS — I10 Essential (primary) hypertension: Secondary | ICD-10-CM | POA: Diagnosis not present

## 2019-08-28 DIAGNOSIS — N2581 Secondary hyperparathyroidism of renal origin: Secondary | ICD-10-CM | POA: Diagnosis not present

## 2019-08-29 ENCOUNTER — Encounter: Payer: Self-pay | Admitting: Family Medicine

## 2019-08-29 ENCOUNTER — Ambulatory Visit (INDEPENDENT_AMBULATORY_CARE_PROVIDER_SITE_OTHER): Payer: Medicare Other | Admitting: Family Medicine

## 2019-08-29 ENCOUNTER — Telehealth: Payer: Self-pay | Admitting: Family Medicine

## 2019-08-29 ENCOUNTER — Other Ambulatory Visit: Payer: Self-pay

## 2019-08-29 VITALS — BP 126/72 | HR 99 | Temp 97.3°F | Resp 16 | Ht 67.0 in | Wt 151.0 lb

## 2019-08-29 DIAGNOSIS — E782 Mixed hyperlipidemia: Secondary | ICD-10-CM

## 2019-08-29 DIAGNOSIS — F330 Major depressive disorder, recurrent, mild: Secondary | ICD-10-CM

## 2019-08-29 MED ORDER — ATORVASTATIN CALCIUM 10 MG PO TABS: 10.0000 mg | ORAL_TABLET | Freq: Every day | ORAL | 3 refills | Status: DC

## 2019-08-29 MED ORDER — PAROXETINE HCL 40 MG PO TABS: 40.0000 mg | ORAL_TABLET | ORAL | 3 refills | Status: DC

## 2019-08-29 MED ORDER — EZETIMIBE 10 MG PO TABS: 10.0000 mg | ORAL_TABLET | Freq: Every day | ORAL | 3 refills | Status: DC

## 2019-08-29 NOTE — Telephone Encounter (Signed)
Patient is asking if it is ok for her to take probiotics. She forgot to mention it to the doctor. Please advise

## 2019-08-29 NOTE — Patient Instructions (Signed)
Restart lipitor 10 mg and take with zetia.    We would like you to take both if you can tolerate.  Come for labs in the next 3 months if we are not able to get them added on to Nephrology labs.

## 2019-08-29 NOTE — Progress Notes (Signed)
Name: Christine Burnett   MRN: [id number]    DOB: 10-10-1940   Date:08/29/2019       Progress Note  Chief Complaint  Patient presents with  . Follow-up  . Depression     Subjective:   Christine Burnett is a 79 y.o. female, presents to clinic for routine follow up on the conditions listed above.  Restarted paxil 1 month ago after she stopped it herself due to feeling great for a few months.  For a few months without meds she she did feel good, but last visit February 23 she presented for dysuria and flank pain and also began to cry and stated that her moods were terrible she had crying fits for no reason was very agitated and snappy and she wanted to go back on her medications.  She states that she restarted the Paxil with a lower dose but within the first week jumped right back to 40 mg dose daily.  Her moods have already returned to her normal.  She feels significantly better less depressed she is no longer crying and she is not snappy agitated or irritable.  Hyperlipidemia: Current Medication Regimen: Patient has a history of hyperlipidemia she was previously on Lipitor for many years but she stopped it recently when she stopped several medications at the same time.  Today she asked for refill on Zetia, and when trying to clarify her past statin medications and when they were started or stopped patient states that she stopped it herself because her cholesterol got "too low".  Last labs were 9 months ago.  She states that she has blood work next week with the nephrologist so she would prefer to do labs at the same time or not do them today.  Her most recent labs were reviewed through care everywhere today, I did reach out to her nephrologist who will help [place] by adding on the lipid panel to his labs when patient comes in next week. Last Lipids: Lab Results  Component Value Date   CHOL 115 11/12/2018   HDL 37 (L) 11/12/2018   LDLCALC 53 11/12/2018   TRIG 171 (H) 11/12/2018   CHOLHDL 3.1  11/12/2018   - Current Diet: She is currently eating what she wants - Denies: Chest pain, shortness of breath, myalgias. - Documented aortic atherosclerosis? No - Risk factors for atherosclerosis: cerebral vascular disease, hypercholesterolemia and hypertension   Patient Active Problem List   Diagnosis Date Noted  . Prediabetes 08/13/2018  . Major depressive disorder, recurrent ([place]) 06/13/2018  . Allergic rhinitis 09/27/2017  . 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 Chronic kidney disease, stage III (moderate) 08/31/2017  . Hyperparathyroidism, secondary renal ([place]) 08/31/2017  . Osteoporosis without current pathological fracture 03/30/2017  . Cardiac murmur 04/07/2015  . 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 Anxiety 01/05/2015  . Hyperlipidemia 01/05/2015  . HTN (hypertension) 09/24/2014    Past Surgical History:  Procedure Laterality Date  . COLONOSCOPY  2016  . ESOPHAGUS SURGERY    . INTRAMEDULLARY (IM) NAIL INTERTROCHANTERIC Left 03/26/2017   Procedure: INTRAMEDULLARY (IM) NAIL INTERTROCHANTRIC;  Surgeon: Hessie Knows, MD;  Location: [facility] ORS;  Service: Orthopedics;  Laterality: Left;    Family History  Problem Relation Age of Onset  . Other Mother        [place] posioning  . Heart attack Father   . Prostate cancer Father   . Heart attack Brother   .  Diabetes Brother   . Hypertension Daughter   . Kidney cancer Neg Hx   . Bladder Cancer Neg Hx   . Colon cancer Neg Hx   . Colon polyps Neg Hx   . Rectal cancer Neg Hx     Social History   Tobacco Use  . Smoking status: Former Smoker    Packs/day: 0.25    Years: 61.00    Pack years: 15.25    Types: Cigarettes    Quit date: 03/25/2017    Years since quitting: 2.4  . Smokeless tobacco: Never Used  . Tobacco comment: smoking cessation materials not required  Substance Use Topics  . Alcohol use: No  . Drug  use: No      Current Outpatient Medications:  .  acetaminophen (TYLENOL) 325 MG tablet, Take 1 tablet (325 mg total) by mouth at bedtime as needed., Disp: , Rfl:  .  albuterol (VENTOLIN HFA) 108 (90 Base) MCG/ACT inhaler, Inhale 2 puffs into the lungs every 6 (six) hours as needed for wheezing or shortness of breath., Disp: 1 Inhaler, Rfl: 2 .  aspirin 81 MG EC tablet, Take 81 mg by mouth daily. Swallow whole., Disp: , Rfl:  .  Cholecalciferol (VITAMIN D-3) 1000 units CAPS, Take 2 capsules by mouth daily. Takes occassionally, Disp: , Rfl:  .  Cyanocobalamin (VITAMIN B-12) 500 MCG SUBL, One tablet dissolved under the tongue once a day (Patient taking differently: One tablet dissolved under the tongue once a day Takes occassionally), Disp: 100 tablet, Rfl: 3 .  cyclobenzaprine (FLEXERIL) 5 MG tablet, Take 1 tablet (5 mg total) by mouth 3 (three) times daily as needed for muscle spasms., Disp: 30 tablet, Rfl: 0 .  ezetimibe (ZETIA) 10 MG tablet, Take 1 tablet by mouth once daily, Disp: 30 tablet, Rfl: 11 .  PARoxetine (PAXIL) 40 MG tablet, Take 40 mg by mouth every morning., Disp: , Rfl:  .  atorvastatin (LIPITOR) 10 MG tablet, Take 1 tablet (10 mg total) by mouth at bedtime. (Patient not taking: Reported on 08/29/2019), Disp: 90 tablet, Rfl: 1  Allergies  Allergen Reactions  . Cefuroxime Axetil     caused rash.  . Ciprofloxacin     Unknown  . Codeine     Chart Review Today: I personally reviewed active problem list, medication list, allergies, family history, social history, health maintenance, notes from last encounter, lab results, imaging with the patient/caregiver today.   Review of Systems  10 Systems reviewed and are negative for acute change except as noted in the HPI.    Objective:    Vitals:   08/29/19 1447  BP: 126/72  Pulse: 99  Resp: 16  Temp: (!) 97.3 F (36.3 C)  TempSrc: Temporal  SpO2: 97%  Weight: 151 lb (68.5 kg)  Height: 5\' 7"  (1.702 m)    Body mass  index is 23.65 kg/m.  Physical Exam Vitals and nursing note reviewed.  Constitutional:      General: She is not in acute distress.    Appearance: Normal appearance. She is well-developed. She is not ill-appearing, toxic-appearing or diaphoretic.     Interventions: Face mask in place.  HENT:     Head: Normocephalic and atraumatic.     Right Ear: External ear normal.     Left Ear: External ear normal.  Eyes:     General: Lids are normal.        Right eye: No discharge.        Left eye: No discharge.  Conjunctiva/sclera: Conjunctivae normal.  Neck:     Trachea: Phonation normal. No tracheal deviation.  Cardiovascular:     Rate and Rhythm: Normal rate and regular rhythm.     Pulses: Normal pulses.          Radial pulses are 2+ on the right side and 2+ on the left side.       Posterior tibial pulses are 2+ on the right side and 2+ on the left side.     Heart sounds: Normal heart sounds. No murmur. No friction rub. No gallop.   Pulmonary:     Effort: Pulmonary effort is normal. No respiratory distress.     Breath sounds: Normal breath sounds. No stridor. No wheezing, rhonchi or rales.  Chest:     Chest wall: No tenderness.  Abdominal:     General: Bowel sounds are normal. There is no distension.     Palpations: Abdomen is soft.     Tenderness: There is no abdominal tenderness. There is no guarding or rebound.  Musculoskeletal:        General: No deformity. Normal range of motion.     Cervical back: Normal range of motion and neck supple.     Right lower leg: No edema.     Left lower leg: No edema.  Lymphadenopathy:     Cervical: No cervical adenopathy.  Skin:    General: Skin is warm and dry.     Capillary Refill: Capillary refill takes less than 2 seconds.     Coloration: Skin is not jaundiced or pale.     Findings: No rash.  Neurological:     Mental Status: She is alert and oriented to person, place, and time.     Motor: No abnormal muscle tone.     Gait: Gait normal.   Psychiatric:        Mood and Affect: Mood normal.        Speech: Speech normal.        Behavior: Behavior normal.       PHQ2/9: Depression screen [facility] 2/9 08/29/2019 07/29/2019 06/27/2019 04/17/2019 03/17/2019  Decreased Interest 0 2 0 0 0  Down, Depressed, Hopeless 0 2 0 0 0  PHQ - 2 Score 0 4 0 0 0  Altered sleeping 0 1 1 0 0  Tired, decreased energy 0 0 0 0 0  Change in appetite 0 0 0 0 0  Feeling bad or failure about yourself  0 0 0 0 0  Trouble concentrating 0 0 0 0 0  Moving slowly or fidgety/restless 0 0 0 0 0  Suicidal thoughts 0 0 0 0 0  PHQ-9 Score 0 5 1 0 0  Difficult doing work/chores Not difficult at all Not difficult at all Not difficult at all Not difficult at all Not difficult at all  Some recent data might be hidden    phq 9 is neg, improved, see HPI and A&P  Fall Risk: Fall Risk  08/29/2019 07/29/2019 06/27/2019 04/17/2019 03/17/2019  Falls in the past year? 0 0 0 0 0  Number falls in past yr: 0 0 0 0 0  Injury with Fall? 0 0 0 0 0  Comment - - - - -  Risk Factor Category  - - - - -  Risk for fall due to : - History of fall(s);Impaired balance/gait;Impaired mobility - - -  Risk for fall due to: Comment - - - - -  Follow up - Follow up appointment - - -  Functional Status Survey: Is the patient deaf or have difficulty hearing?: No Does the patient have difficulty seeing, even when wearing glasses/contacts?: No Does the patient have difficulty concentrating, remembering, or making decisions?: No Does the patient have difficulty walking or climbing stairs?: No Does the patient have difficulty dressing or bathing?: No Does the patient have difficulty doing errands alone such as visiting a doctor's office or shopping?: No   Assessment & Plan:   1. Mild episode of recurrent major depressive disorder (HCC) Patient's mood is improved with getting back on her medications.  She is no longer depressed, crying or agitated and snappy.  She has had no side effects  or concerns with getting back on the medications.  Refill sent in today. - PARoxetine (PAXIL) 40 MG tablet; Take 1 tablet (40 mg total) by mouth every morning.  Dispense: 90 tablet; Refill: 3  2. Mixed hyperlipidemia Patient had taken herself off her statin medication and did ask for a refill of Zetia today, last labs were 6 months ago, LDL was low so patient stopped her medication, she is due for labs today but prefers to do them with nephrology.  We discussed the Lipitor medication and mechanism of action and benefits versus side effects.  She did not have any side effects in the past so she was strongly encouraged to restart the Lipitor medication which she agreed to today.  90-day refills were put in for both Zetia and Lipitor.  Her nephrologist was kind enough to talk to me today and was willing to add on a lipid panel to his labs which will be completed next week. - ezetimibe (ZETIA) 10 MG tablet; Take 1 tablet (10 mg total) by mouth daily.  Dispense: 90 tablet; Refill: 3 - atorvastatin (LIPITOR) 10 MG tablet; Take 1 tablet (10 mg total) by mouth at bedtime.  Dispense: 90 tablet; Refill: 3 - Lipid panel  Patient is due for bone scan we discussed this since she is monitoring vitamin D parathyroid hormone etc. with her nephrologist but she states that she does not want to do any preventative medication monitoring her skin she does not want to do any mammograms so the DEXA scan was marked as refused  Return for 6 month recheck on paxil/hld/htn.   Delsa Grana, PA-C 08/29/19 3:06 PM

## 2019-09-01 NOTE — Telephone Encounter (Signed)
notified

## 2019-10-20 ENCOUNTER — Telehealth: Payer: Self-pay | Admitting: Family Medicine

## 2019-10-20 NOTE — Chronic Care Management (AMB) (Signed)
  Chronic Care Management   Outreach Note  10/20/2019 Name: Christine Burnett MRN: [id number] DOB: [birth year]  [name] is a 79 y.o. year old female who is a primary care patient of Delsa Grana, [place]. I reached out to [name] by phone today in response to a referral sent by [name]'s health plan.     An unsuccessful telephone outreach was attempted today. The patient was referred to the case management team for assistance with care management and care coordination.   Follow Up Plan: A HIPPA compliant phone message was left for the patient providing contact information and requesting a return call.  The care management team will reach out to the patient again over the next 7 days.  If patient returns call to provider office, please advise to call [facility] [place], [place], [place], [place] [address] Direct Dial: [phone] [email or web address]: [email or web address]

## 2019-10-24 NOTE — Chronic Care Management (AMB) (Signed)
  Chronic Care Management   Note  10/24/2019 Name: TAUNYA GORAL MRN: [id number] DOB: 11-Jan-1941  [name] Birenbaum is a 79 y.o. year old female who is a primary care patient of Delsa Grana, [place]. I reached out to [name] by phone today in response to a referral sent by Ms. [name] Sch[place]'s health plan.     Ms. [place] was given information about Chronic Care Management services today including:  1. CCM service includes personalized support from designated clinical staff supervised by her physician, including individualized plan of care and coordination with other care providers 2. 24/7 contact phone numbers for assistance for urgent and routine care needs. 3. Service will only be billed when office clinical staff spend 20 minutes or more in a month to coordinate care. 4. Only one practitioner may furnish and bill the service in a calendar month. 5. The patient may stop CCM services at any time (effective at the end of the month) by phone call to the office staff. 6. The patient will be responsible for cost sharing (co-pay) of up to 20% of the service fee (after annual deductible is met).  Patient did not agree to enrollment in care management services and does not wish to consider at this time.  Follow up plan: The patient has been provided with contact information for the care management team and has been advised to call with any health related questions or concerns.   Noreene Larsson, [place], [place], [place] [address] Direct Dial: [phone] [email or web address]: [email or web address]

## 2020-01-28 NOTE — Progress Notes (Signed)
Name: Christine Burnett   MRN: [id number]    DOB: 03/22/1941   Date:01/29/2020       Progress Note  Chief Complaint  Patient presents with  . Follow-up  . Depression  . Hyperlipidemia  . Hypertension     Subjective:   Christine Burnett is a 79 y.o. female, presents to clinic for routine f/up  Hyperlipidemia: Currently treated with Zetia and atorvastatin, pt reports good med compliance - concerned zetia is making her teeth yellow Last Lipids: Lab Results  Component Value Date   CHOL 115 11/12/2018   HDL 37 (L) 11/12/2018   LDLCALC 53 11/12/2018   TRIG 171 (H) 11/12/2018   CHOLHDL 3.1 11/12/2018   - Denies: Chest pain, shortness of breath, myalgias, claudication  .CKD stage 3 - pt not taking losartan cause her BP is good and she doesn't want to take it, and she doesn't want to see Dr. Holley Raring either.  Lab Results  Component Value Date   CREATININE 1.48 (H) 01/21/2019   Lab Results  Component Value Date   BUN 23 01/21/2019   Lab Results  Component Value Date   GFRNONAA 34 (L) 01/21/2019     Hypertension:  Currently no meds - BP well controlled today Pt reports she is non-compliance and denies any SE.   Blood pressure today is well controlled. BP Readings from Last 3 Encounters:  01/29/20 132/80  01/29/20 132/80  08/29/19 126/72   Pt denies CP, SOB, exertional sx, LE edema, palpitation, Ha's, visual disturbances, lightheadedness, hypotension, syncope.   Stress and anxiety, stress at home with family members - daughter One child is addicted to drugs the other is an alcoholic, they fight all the time and the tension is causing pt significant stress, anxiety and depression.  She wants stay on SSRI meds and even possibly increase the dose. phq 9 reviewed today , is positive Pt did just visit with [employer] [employer] for [place] - states she "cried the whole time"   Depression screen [facility] 2/9 01/29/2020 08/29/2019 07/29/2019  Decreased Interest 1 0 2  Down, Depressed, Hopeless 1  0 2  PHQ - 2 Score 2 0 4  Altered sleeping 1 0 1  Tired, decreased energy 1 0 0  Change in appetite 1 0 0  Feeling bad or failure about yourself  3 0 0  Trouble concentrating 0 0 0  Moving slowly or fidgety/restless 0 0 0  Suicidal thoughts 1 0 0  PHQ-9 Score 9 0 5  Difficult doing work/chores Somewhat difficult Not difficult at all Not difficult at all  Some recent data might be hidden   No flowsheet data found.          Current Outpatient Medications:  .  acetaminophen (TYLENOL) 650 MG CR tablet, Take 1,300 mg by mouth every 8 (eight) hours as needed for pain., Disp: , Rfl:  .  albuterol (VENTOLIN HFA) 108 (90 Base) MCG/ACT inhaler, Inhale 2 puffs into the lungs every 6 (six) hours as needed for wheezing or shortness of breath., Disp: 1 Inhaler, Rfl: 2 .  aspirin 81 MG EC tablet, Take 81 mg by mouth daily. Swallow whole., Disp: , Rfl:  .  atorvastatin (LIPITOR) 10 MG tablet, Take 1 tablet (10 mg total) by mouth at bedtime., Disp: 90 tablet, Rfl: 3 .  Cholecalciferol (VITAMIN D-3) 1000 units CAPS, Take 2 capsules by mouth daily. Takes occassionally, Disp: , Rfl:  .  Cyanocobalamin (VITAMIN B-12) 500 MCG SUBL, One tablet dissolved under the  tongue once a day (Patient taking differently: One tablet dissolved under the tongue once a day Takes occassionally), Disp: 100 tablet, Rfl: 3 .  cyclobenzaprine (FLEXERIL) 5 MG tablet, Take 1 tablet (5 mg total) by mouth 3 (three) times daily as needed for muscle spasms. (Patient not taking: Reported on 01/29/2020), Disp: 30 tablet, Rfl: 0 .  ezetimibe (ZETIA) 10 MG tablet, Take 1 tablet (10 mg total) by mouth daily., Disp: 90 tablet, Rfl: 3 .  hydrOXYzine (ATARAX/VISTARIL) 10 MG tablet, Take 10 mg by mouth. Take 1-2 tablets by mouth every 12 hours as needed for anxiety, Disp: , Rfl:  .  PARoxetine (PAXIL) 40 MG tablet, Take 1 tablet (40 mg total) by mouth every morning., Disp: 90 tablet, Rfl: 3  Patient Active Problem List   Diagnosis Date  Noted  . Prediabetes 08/13/2018  . Major depressive disorder, recurrent ([place]) 06/13/2018  . Allergic rhinitis 09/27/2017  . 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 Chronic kidney disease, stage III (moderate) 08/31/2017  . 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 Osteoporosis without current pathological fracture 03/30/2017  . Cardiac murmur 04/07/2015  . 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 Anxiety 01/05/2015  . Hyperlipidemia 01/05/2015  . HTN (hypertension) 09/24/2014    Past Surgical History:  Procedure Laterality Date  . COLONOSCOPY  2016  . ESOPHAGUS SURGERY    . INTRAMEDULLARY (IM) NAIL INTERTROCHANTERIC Left 03/26/2017   Procedure: INTRAMEDULLARY (IM) NAIL INTERTROCHANTRIC;  Surgeon: Hessie Knows, MD;  Location: [facility] ORS;  Service: Orthopedics;  Laterality: Left;    Family History  Problem Relation Age of Onset  . Other Mother        Ureme[place] posioning  . Heart attack Father   . Prostate cancer Father   . Heart attack Brother   . Diabetes Brother   . Hypertension Daughter   . Kidney cancer Neg Hx   . Bladder Cancer Neg Hx   . Colon cancer Neg Hx   . Colon polyps Neg Hx   . Rectal cancer Neg Hx     Social History   Tobacco Use  . Smoking status: Former Smoker    Packs/day: 0.25    Years: 61.00    Pack years: 15.25    Types: Cigarettes    Quit date: 03/25/2017    Years since quitting: 2.8  . Smokeless tobacco: Never Used  . Tobacco comment: smoking cessation materials not required  Vaping Use  . Vaping Use: Never used  Substance Use Topics  . Alcohol use: No  . Drug use: No     Allergies  Allergen Reactions  . Cefuroxime Axetil     caused rash.  . Ciprofloxacin     Unknown  . Codeine     Health Maintenance  Topic Date Due  . Hepatitis C Screening  Never done  . COVID-19 Vaccine (1) Never done  . INFLUENZA VACCINE   01/04/2020  . DEXA SCAN  02/04/2020 (Originally 10/31/2013)  . TETANUS/TDAP  05/30/2027  . PNA vac Low Risk Adult  Completed    Chart Review Today: I personally reviewed active problem list, medication list, allergies, family history, social history, health maintenance, notes from last encounter, lab results, imaging with the patient/caregiver today.  Review of Systems  10 Systems reviewed and are negative for acute change except as noted in the HPI.    Objective:   Vitals:   01/29/20 1419  BP: 132/80  Pulse: 83  Resp: 14  Temp: 97.9 F (36.6 C)  TempSrc: Oral  SpO2: 95%  Weight: 151 lb (68.5 kg)  Height: 5\' 7"  (1.702 m)    Body mass index is 23.65 kg/m.  Physical Exam Vitals and nursing note reviewed.  Constitutional:      General: She is not in acute distress.    Appearance: Normal appearance. She is normal weight. She is not ill-appearing, toxic-appearing or diaphoretic.  HENT:     Head: Normocephalic and atraumatic.     Right Ear: External ear normal.     Left Ear: External ear normal.  Eyes:     General:        Right eye: No discharge.        Left eye: No discharge.     Conjunctiva/sclera: Conjunctivae normal.  Cardiovascular:     Rate and Rhythm: Normal rate and regular rhythm.     Pulses: Normal pulses.     Heart sounds: Normal heart sounds. No murmur heard.  No friction rub. No gallop.   Abdominal:     General: Bowel sounds are normal. There is no distension.     Palpations: Abdomen is soft.     Tenderness: There is no abdominal tenderness.  Musculoskeletal:     Cervical back: Normal range of motion.     Right lower leg: No edema.     Left lower leg: No edema.  Skin:    General: Skin is warm and dry.     Capillary Refill: Capillary refill takes less than 2 seconds.     Coloration: Skin is not jaundiced or pale.  Neurological:     Mental Status: She is alert. Mental status is at baseline.  Psychiatric:        Attention and Perception: Attention  normal.        Mood and Affect: Mood and affect normal.        Speech: Speech normal.        Behavior: Behavior normal. Behavior is cooperative.        Thought Content: Thought content normal.         Assessment & Plan:   1. Essential hypertension Blood pressure well controlled with diet/lifestyle, but urged pt to reconsider losartan per Dr. Holley Raring - explained meds are to help protect kidney and pt has not been prescribed it for high BP but to help her keep kidneys as healthy as possible - COMPLETE METABOLIC PANEL WITH GFR - losartan (COZAAR) 25 MG tablet; Take 1 tablet (25 mg total) by mouth in the morning.  Dispense: 90 tablet; Refill: 3  2. Mixed hyperlipidemia Compliant with meds, no SE, no myalgias, fatigue or jaundice Due for FLP and recheck CMP Diet and exercise recommendations for HLD reviewed - Lipid panel - atorvastatin (LIPITOR) 10 MG tablet; Take 1 tablet (10 mg total) by mouth at bedtime.  Dispense: 90 tablet; Refill: 3  3. Mild episode of recurrent major depressive disorder ([place]) Discussed current worsening depression - situational Pt intermittently tearful during visit, her other daughter was with her, she was open to SW CCM referral or other resources.  Emotional support given today - PARoxetine (PAXIL) 40 MG tablet; Take 1 tablet (40 mg total) by mouth every morning.  Dispense: 90 tablet; Refill: 3  4. Encounter for hepatitis C screening test for low risk patient - Hepatitis C Antibody  5. Stage 3b chronic kidney disease Pt instructed to avoid NSAIDs, make sure she is pushing adequate hydration, avoiding other  meds hard on kidneys Encouraged her to restart losartan and consider seeing nephrology again -CMP  - losartan (COZAAR) 25 MG tablet; Take 1 tablet (25 mg total) by mouth in the morning.  Dispense: 90 tablet; Refill: 3  6. Anxiety situtational due to family conflict - reviewed options given her situation - She is excited to talk to SW - explained Chrystal  can also help with resources and is a Microbiologist, listener and anchor for our patients.    7. Need for follow-up by social worker Referral  Placed by LPN - discussed with CCM [facility] team today   8. Family conflict -CCM SW    Return in about 3 months (around 04/30/2020) for Routine follow-up.   Delsa Grana, PA-C 01/29/20 2:10 PM

## 2020-01-29 ENCOUNTER — Other Ambulatory Visit: Payer: Self-pay

## 2020-01-29 ENCOUNTER — Ambulatory Visit (INDEPENDENT_AMBULATORY_CARE_PROVIDER_SITE_OTHER): Payer: Medicare Other | Admitting: Family Medicine

## 2020-01-29 ENCOUNTER — Encounter: Payer: Self-pay | Admitting: Family Medicine

## 2020-01-29 ENCOUNTER — Ambulatory Visit (INDEPENDENT_AMBULATORY_CARE_PROVIDER_SITE_OTHER): Payer: Medicare Other

## 2020-01-29 VITALS — BP 132/80 | HR 107 | Temp 97.9°F | Resp 16 | Ht 67.0 in | Wt 151.0 lb

## 2020-01-29 VITALS — BP 132/80 | HR 83 | Temp 97.9°F | Resp 14 | Ht 67.0 in | Wt 151.0 lb

## 2020-01-29 DIAGNOSIS — Z789 Other specified health status: Secondary | ICD-10-CM

## 2020-01-29 DIAGNOSIS — F330 Major depressive disorder, recurrent, mild: Secondary | ICD-10-CM

## 2020-01-29 DIAGNOSIS — I10 Essential (primary) hypertension: Secondary | ICD-10-CM

## 2020-01-29 DIAGNOSIS — E782 Mixed hyperlipidemia: Secondary | ICD-10-CM

## 2020-01-29 DIAGNOSIS — N1832 Chronic kidney disease, stage 3b: Secondary | ICD-10-CM | POA: Diagnosis not present

## 2020-01-29 DIAGNOSIS — F419 Anxiety disorder, unspecified: Secondary | ICD-10-CM | POA: Diagnosis not present

## 2020-01-29 DIAGNOSIS — Z638 Other specified problems related to primary support group: Secondary | ICD-10-CM | POA: Diagnosis not present

## 2020-01-29 DIAGNOSIS — Z0000 Encounter for general adult medical examination without abnormal findings: Secondary | ICD-10-CM | POA: Diagnosis not present

## 2020-01-29 DIAGNOSIS — Z1159 Encounter for screening for other viral diseases: Secondary | ICD-10-CM | POA: Diagnosis not present

## 2020-01-29 MED ORDER — HYDROXYZINE HCL 10 MG PO TABS: 10.0000 mg | ORAL_TABLET | Freq: Three times a day (TID) | ORAL | 3 refills | Status: DC | PRN

## 2020-01-29 MED ORDER — PAROXETINE HCL 40 MG PO TABS: 40.0000 mg | ORAL_TABLET | ORAL | 3 refills | Status: DC

## 2020-01-29 NOTE — Progress Notes (Signed)
Subjective:   Christine Burnett is a 79 y.o. female who presents for Medicare Annual (Subsequent) preventive examination.  Review of Systems     Cardiac Risk Factors include: advanced age (>33men, >22 women);dyslipidemia;sedentary lifestyle     Objective:    Today's Vitals   01/29/20 1338 01/29/20 1341  BP: 132/80   Pulse: (!) 107   Resp: 16   Temp: 97.9 F (36.6 C)   TempSrc: Oral   SpO2: 95%   Weight: 151 lb (68.5 kg)   Height: 5\' 7"  (1.702 m)   PainSc:  4    Body mass index is 23.65 kg/m.  Advanced Directives 01/29/2020 01/28/2019 01/24/2018 05/29/2017 04/09/2017 03/25/2017 03/25/2017  Does Patient Have a Medical Advance Directive? Yes Yes Yes No No Yes Yes  Type of Paramedic of [place];Living will [place];Living will [place];Living will - - [place];Living will [place]  Does patient want to make changes to medical advance directive? - - - - - No - Patient declined Yes (Inpatient - patient defers changing a medical advance directive at this time)  Copy of [place] in Chart? No - copy requested No - copy requested No - copy requested - - No - copy requested -  Would patient like information on creating a medical advance directive? - - - - - Yes (Inpatient - patient defers creating a medical advance directive at this time) Yes (Inpatient - patient defers creating a medical advance directive at this time)    Current Medications (verified) Outpatient Encounter Medications as of 01/29/2020  Medication Sig  . acetaminophen (TYLENOL) 650 MG CR tablet Take 1,300 mg by mouth every 8 (eight) hours as needed for pain.  Marland Kitchen albuterol (VENTOLIN HFA) 108 (90 Base) MCG/ACT inhaler Inhale 2 puffs into the lungs every 6 (six) hours as needed for wheezing or shortness of breath.  Marland Kitchen aspirin 81 MG EC tablet Take 81 mg by mouth daily. Swallow whole.  Marland Kitchen atorvastatin  (LIPITOR) 10 MG tablet Take 1 tablet (10 mg total) by mouth at bedtime.  . Cholecalciferol (VITAMIN D-3) 1000 units CAPS Take 2 capsules by mouth daily. Takes occassionally  . Cyanocobalamin (VITAMIN B-12) 500 MCG SUBL One tablet dissolved under the tongue once a day (Patient taking differently: One tablet dissolved under the tongue once a day Takes occassionally)  . ezetimibe (ZETIA) 10 MG tablet Take 1 tablet (10 mg total) by mouth daily.  . hydrOXYzine (ATARAX/VISTARIL) 10 MG tablet Take 10 mg by mouth. Take 1-2 tablets by mouth every 12 hours as needed for anxiety  . melatonin 5 MG TABS Take 5 mg by mouth at bedtime. Pt taking 2 tabs qhs  . PARoxetine (PAXIL) 40 MG tablet Take 1 tablet (40 mg total) by mouth every morning.  . Probiotic Product (PROBIOTIC-10 PO) Take by mouth.  . cyclobenzaprine (FLEXERIL) 5 MG tablet Take 1 tablet (5 mg total) by mouth 3 (three) times daily as needed for muscle spasms. (Patient not taking: Reported on 01/29/2020)  . [DISCONTINUED] acetaminophen (TYLENOL) 325 MG tablet Take 1 tablet (325 mg total) by mouth at bedtime as needed.   No facility-administered encounter medications on file as of 01/29/2020.    Allergies (verified) Cefuroxime axetil, Ciprofloxacin, and Codeine   History: Past Medical History:  Diagnosis Date  . Allergic rhinitis   . Anxiety   . Atrial fibrillation ([place]) 08/09/2017  . Chronic bronchitis ([place])   . Chronic  kidney disease, stage III (moderate) 08/31/2017   Seeing nephrologist  . CKD (chronic kidney disease), stage III   . Cornea scar   . Depression    unspecified  . Diverticulosis   . Hx of fracture of left hip 12/24/2017  . Hyperlipidemia   . 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 Hypertension   . Osteoporosis   . Risk for falls   . Situational disturbance   . TIA (transient ischemic attack)    Past Surgical History:  Procedure Laterality Date  . COLONOSCOPY  2016  . ESOPHAGUS  SURGERY    . INTRAMEDULLARY (IM) NAIL INTERTROCHANTERIC Left 03/26/2017   Procedure: INTRAMEDULLARY (IM) NAIL INTERTROCHANTRIC;  Surgeon: Hessie Knows, MD;  Location: [facility] ORS;  Service: Orthopedics;  Laterality: Left;   Family History  Problem Relation Age of Onset  . Other Mother        [place] posioning  . Heart attack Father   . Prostate cancer Father   . Heart attack Brother   . Diabetes Brother   . Hypertension Daughter   . Kidney cancer Neg Hx   . Bladder Cancer Neg Hx   . Colon cancer Neg Hx   . Colon polyps Neg Hx   . Rectal cancer Neg Hx    Social History   Socioeconomic History  . Marital status: Divorced    Spouse name: Not on file  . Number of children: 4  . Years of education: Not on file  . Highest education level: 12th grade  Occupational History  . Occupation: Retired  Tobacco Use  . Smoking status: Former Smoker    Packs/day: 0.25    Years: 61.00    Pack years: 15.25    Types: Cigarettes    Quit date: 03/25/2017    Years since quitting: 2.8  . Smokeless tobacco: Never Used  . Tobacco comment: smoking cessation materials not required  Vaping Use  . Vaping Use: Never used  Substance and Sexual Activity  . Alcohol use: No  . Drug use: No  . Sexual activity: Not Currently  Other Topics Concern  . Not on file  Social History Narrative   Full Code   4 children   Denies alcohol use   Patient has 2 daughters and 1 grandson living with her   Social Determinants of Health   Financial Resource Strain: Low Risk   . Difficulty of Paying Living Expenses: Not hard at all  Food Insecurity: No Food Insecurity  . Worried About Charity fundraiser in the Last Year: Never true  . Ran Out of Food in the Last Year: Never true  Transportation Needs: No Transportation Needs  . Lack of Transportation (Medical): No  . Lack of Transportation (Non-Medical): No  Physical Activity: Inactive  . Days of Exercise per Week: 0 days  . Minutes of Exercise per Session:  0 min  Stress: Stress Concern Present  . Feeling of Stress : Rather much  Social Connections: Moderately Isolated  . Frequency of Communication with Friends and Family: More than three times a week  . Frequency of Social Gatherings with Friends and Family: More than three times a week  . Attends Religious Services: More than 4 times per year  . Active Member of Clubs or Organizations: No  . Attends Archivist Meetings: Never  . Marital Status: Divorced    Tobacco Counseling Counseling given: Not Answered Comment: smoking cessation materials not required   Clinical Intake:  Pre-visit preparation completed: Yes  Pain : 0-10 Pain Score: 4  Pain Type: Chronic pain Pain Location: Hip Pain Orientation: Right Pain Descriptors / Indicators: Aching, Sore Pain Onset: More than a month ago Pain Frequency: Constant     BMI - recorded: 23.65 Nutritional Status: BMI of 19-24  Normal Nutritional Risks: None Diabetes: No  How often do you need to have someone help you when you read instructions, pamphlets, or other written materials from your doctor or pharmacy?: 1 - Never    Interpreter Needed?: No  Information entered by :: Clemetine Marker LPN   Activities of Daily Living In your present state of health, do you have any difficulty performing the following activities: 01/29/2020 08/29/2019  Hearing? N N  Comment declines hearing aids -  Vision? N N  Difficulty concentrating or making decisions? Y N  Walking or climbing stairs? Y N  Dressing or bathing? N N  Doing errands, shopping? N N  Preparing Food and eating ? N -  Using the Toilet? N -  In the past six months, have you accidently leaked urine? Y -  Comment wears pads at night for protection -  Do you have problems with loss of bowel control? N -  Managing your Medications? N -  Managing your Finances? N -  Housekeeping or managing your Housekeeping? N -  Some recent data might be hidden    Patient Care  Team: Delsa Grana, PA-C as PCP - General (Family Medicine) Kate Sable, MD as PCP - Cardiology (Cardiology) Dagoberto Ligas, MD as Consulting Physician (Internal Medicine) Anthonette Legato, MD as Consulting Physician (Nephrology)  Indicate any recent Medical Services you may have received from other than [facility] providers in the past year (date may be approximate).     Assessment:   This is a routine wellness examination for Elida.  Hearing/Vision screen  Hearing Screening   125Hz  250Hz  500Hz  1000Hz  2000Hz  3000Hz  4000Hz  6000Hz  8000Hz   Right ear:           Left ear:           Comments: Pt denies hearing difficulty  Vision Screening Comments: Annual vision screenings at [facility] in [place] issues and exercise activities discussed: Current Exercise Habits: The patient does not participate in regular exercise at present, Exercise limited by: orthopedic condition(s)  Goals    . DIET - INCREASE WATER INTAKE     Recommend to drink at least 6-8 8oz glasses of water per day.    . Quit smoking / using tobacco     Recommend to quit smoking. Pt to taper down 1 cigarette a day to 0 cigarettes.       Depression Screen PHQ 2/9 Scores 01/29/2020 08/29/2019 07/29/2019 06/27/2019 04/17/2019 03/17/2019 02/18/2019  PHQ - 2 Score 2 0 4 0 0 0 0  PHQ- 9 Score 9 0 5 1 0 0 0    Fall Risk Fall Risk  01/29/2020 08/29/2019 07/29/2019 06/27/2019 04/17/2019  Falls in the past year? 0 0 0 0 0  Number falls in past yr: 0 0 0 0 0  Injury with Fall? 0 0 0 0 0  Comment - - - - -  Risk Factor Category  - - - - -  Risk for fall due to : Impaired balance/gait - History of fall(s);Impaired balance/gait;Impaired mobility - -  Risk for fall due to: Comment - - - - -  Follow up Falls prevention discussed - Follow up appointment - -  Any stairs in or around the home? Yes  If so, are there any without handrails? No  Home free of loose throw rugs in walkways, pet beds, electrical cords, etc?  Yes  Adequate lighting in your home to reduce risk of falls? Yes   ASSISTIVE DEVICES UTILIZED TO PREVENT FALLS:  Life alert? No  Use of a cane, walker or w/c? Yes  Grab bars in the bathroom? Yes  Shower chair or bench in shower? No  Elevated toilet seat or a handicapped toilet? No   TIMED UP AND GO:  Was the test performed? Yes .  Length of time to ambulate 10 feet: 8 sec.   Gait slow and steady with assistive device  Cognitive Function:     6CIT Screen 01/29/2020 01/28/2019 01/24/2018 12/25/2016  What Year? 0 points 0 points 0 points 0 points  What month? 0 points 0 points 0 points 0 points  What time? 0 points 0 points 0 points 0 points  Count back from 20 0 points 0 points 0 points 0 points  Months in reverse 2 points 0 points 0 points 0 points  Repeat phrase 0 points 2 points 0 points 6 points  Total Score 2 2 0 6    Immunizations Immunization History  Administered Date(s) Administered  . Fluad Quad(high Dose 65+) 06/27/2019  . Influenza Split 03/15/2010, 03/21/2011  . Influenza, High Dose Seasonal PF 04/07/2015, 03/22/2016, 03/13/2017, 02/25/2018  . Influenza,inj,Quad PF,6+ Mos 03/19/2013, 03/10/2014  . Pneumococcal Conjugate-13 11/08/2017  . Pneumococcal Polysaccharide-23 01/21/2019  . Tdap 05/29/2017    TDAP status: Up to date   Flu Vaccine status: Up to date   Pneumococcal vaccine status: Up to date   Covid-19 vaccine status: Information provided on how to obtain vaccines.   Qualifies for Shingles Vaccine? Yes   Zostavax completed No   Shingrix Completed?: No.    Education has been provided regarding the importance of this vaccine. Patient has been advised to call insurance company to determine out of pocket expense if they have not yet received this vaccine. Advised may also receive vaccine at local pharmacy or Health Dept. Verbalized acceptance and understanding.  Screening Tests Health Maintenance  Topic Date Due  . Hepatitis C Screening  Never done    . COVID-19 Vaccine (1) Never done  . INFLUENZA VACCINE  01/04/2020  . DEXA SCAN  02/04/2020 (Originally 10/31/2013)  . TETANUS/TDAP  05/30/2027  . PNA vac Low Risk Adult  Completed    Health Maintenance  Health Maintenance Due  Topic Date Due  . Hepatitis C Screening  Never done  . COVID-19 Vaccine (1) Never done  . INFLUENZA VACCINE  01/04/2020    Colorectal cancer screening: No longer required.    Mammogram screening status: pt declines repeat screening  Bone density screening status: pt declines repeat screening   Lung Cancer Screening: (Low Dose CT Chest recommended if Age 31-80 years, 30 pack-year currently smoking OR have quit w/in 15years.) does not qualify.    Additional Screening:  Hepatitis C Screening: does qualify; postponed   Vision Screening: Recommended annual ophthalmology exams for early detection of glaucoma and other disorders of the eye. Is the patient up to date with their annual eye exam?  Yes  Who is the provider or what is the name of the office in which the patient attends annual eye exams? [facility]  Dental Screening: Recommended annual dental exams for proper oral hygiene  Community Resource Referral / Chronic Care Management: CRR required  this visit?  No   CCM required this visit?  Yes      Plan:     I have personally reviewed and noted the following in the patient's chart:   . Medical and social history . Use of alcohol, tobacco or illicit drugs  . Current medications and supplements . Functional ability and status . Nutritional status . Physical activity . Advanced directives . List of other physicians . Hospitalizations, surgeries, and ER visits in previous 12 months . Vitals . Screenings to include cognitive, depression, and falls . Referrals and appointments  In addition, I have reviewed and discussed with patient certain preventive protocols, quality metrics, and best practice recommendations. A written  personalized care plan for preventive services as well as general preventive health recommendations were provided to patient.     Clemetine Marker, LPN   03/03/2445   Nurse Notes: pt accompanied to visit by her daughter Christine Burnett. Pt c/o stress at home due to family relationships and tension between her daughters Izora Gala and Diane who live with her. Patient is concerned that Diane has undiagnosed mental health issues and/or substance abuse problems. Patient was tearful during visit. PHQ 9 today of 9. Referral to [facility]CM for social work placed today.

## 2020-01-29 NOTE — Patient Instructions (Signed)
Christine Burnett , Thank you for taking time to come for your Medicare Wellness Visit. I appreciate your ongoing commitment to your health goals. Please review the following plan we discussed and let me know if I can assist you in the future.   Screening recommendations/referrals: Colonoscopy: no longer required Mammogram: no longer required Bone Density: no longer required Recommended yearly ophthalmology/optometry visit for glaucoma screening and checkup Recommended yearly dental visit for hygiene and checkup  Vaccinations: Influenza vaccine: done 06/27/19 Pneumococcal vaccine: done 01/20/17 Tdap vaccine: done 05/29/17 Shingles vaccine: Shingrix discussed. Please contact your pharmacy for coverage information.  Covid-19: discussed  Advanced directives: Please bring a copy of your health care power of attorney and living will to the office at your convenience.  Conditions/risks identified:  Recommend drinking 6-8 glasses of water per day   Next appointment: Follow up in one year for your annual wellness visit    Preventive Care 65 Years and Older, Female Preventive care refers to lifestyle choices and visits with your health care provider that can promote health and wellness. What does preventive care include?  A yearly physical exam. This is also called an annual well check.  Dental exams once or twice a year.  Routine eye exams. Ask your health care provider how often you should have your eyes checked.  Personal lifestyle choices, including:  Daily care of your teeth and gums.  Regular physical activity.  Eating a healthy diet.  Avoiding tobacco and drug use.  Limiting alcohol use.  Practicing safe sex.  Taking low-dose aspirin every day.  Taking vitamin and mineral supplements as recommended by your health care provider. What happens during an annual well check? The services and screenings done by your health care provider during your annual well check will depend on  your age, overall health, lifestyle risk factors, and family history of disease. Counseling  Your health care provider may ask you questions about your:  Alcohol use.  Tobacco use.  Drug use.  Emotional well-being.  Home and relationship well-being.  Sexual activity.  Eating habits.  History of falls.  Memory and ability to understand (cognition).  Work and work Statistician.  Reproductive health. Screening  You may have the following tests or measurements:  Height, weight, and BMI.  Blood pressure.  Lipid and cholesterol levels. These may be checked every 5 years, or more frequently if you are over 7 years old.  Skin check.  Lung cancer screening. You may have this screening every year starting at age 80 if you have a 30-pack-year history of smoking and currently smoke or have quit within the past 15 years.  Fecal occult blood test (FOBT) of the stool. You may have this test every year starting at age 80.  Flexible sigmoidoscopy or colonoscopy. You may have a sigmoidoscopy every 5 years or a colonoscopy every 10 years starting at age 80.  Hepatitis C blood test.  Hepatitis B blood test.  Sexually transmitted disease (STD) testing.  Diabetes screening. This is done by checking your blood sugar (glucose) after you have not eaten for a while (fasting). You may have this done every 1-3 years.  Bone density scan. This is done to screen for osteoporosis. You may have this done starting at age 80.  Mammogram. This may be done every 1-2 years. Talk to your health care provider about how often you should have regular mammograms. Talk with your health care provider about your test results, treatment options, and if necessary, the need for more  tests. Vaccines  Your health care provider may recommend certain vaccines, such as:  Influenza vaccine. This is recommended every year.  Tetanus, diphtheria, and acellular pertussis (Tdap, Td) vaccine. You may need a Td booster  every 10 years.  Zoster vaccine. You may need this after age 76.  Pneumococcal 13-valent conjugate (PCV13) vaccine. One dose is recommended after age 80.  Pneumococcal polysaccharide (PPSV23) vaccine. One dose is recommended after age 80. Talk to your health care provider about which screenings and vaccines you need and how often you need them. This information is not intended to replace advice given to you by your health care provider. Make sure you discuss any questions you have with your health care provider. Document Released: 06/18/2015 Document Revised: 02/09/2016 Document Reviewed: 03/23/2015 Elsevier Interactive Patient Education  2017 [place] Prevention in the Home Falls can cause injuries. They can happen to people of all ages. There are many things you can do to make your home safe and to help prevent falls. What can I do on the outside of my home?  Regularly fix the edges of walkways and driveways and fix any cracks.  Remove anything that might make you trip as you walk through a door, such as a raised step or threshold.  Trim any bushes or trees on the path to your home.  Use bright outdoor lighting.  Clear any walking paths of anything that might make someone trip, such as rocks or tools.  Regularly check to see if handrails are loose or broken. Make sure that both sides of any steps have handrails.  Any raised decks and porches should have guardrails on the edges.  Have any leaves, snow, or ice cleared regularly.  Use sand or salt on walking paths during winter.  Clean up any spills in your garage right away. This includes oil or grease spills. What can I do in the bathroom?  Use night lights.  Install grab bars by the toilet and in the tub and shower. Do not use towel bars as grab bars.  Use non-skid mats or decals in the tub or shower.  If you need to sit down in the shower, use a plastic, non-slip stool.  Keep the floor dry. Clean up any  water that spills on the floor as soon as it happens.  Remove soap buildup in the tub or shower regularly.  Attach bath mats securely with double-sided non-slip rug tape.  Do not have throw rugs and other things on the floor that can make you trip. What can I do in the bedroom?  Use night lights.  Make sure that you have a light by your bed that is easy to reach.  Do not use any sheets or blankets that are too big for your bed. They should not hang down onto the floor.  Have a firm chair that has side arms. You can use this for support while you get dressed.  Do not have throw rugs and other things on the floor that can make you trip. What can I do in the kitchen?  Clean up any spills right away.  Avoid walking on wet floors.  Keep items that you use a lot in easy-to-reach places.  If you need to reach something above you, use a strong step stool that has a grab bar.  Keep electrical cords out of the way.  Do not use floor polish or wax that makes floors slippery. If you must use wax, use non-skid floor  wax.  Do not have throw rugs and other things on the floor that can make you trip. What can I do with my stairs?  Do not leave any items on the stairs.  Make sure that there are handrails on both sides of the stairs and use them. Fix handrails that are broken or loose. Make sure that handrails are as long as the stairways.  Check any carpeting to make sure that it is firmly attached to the stairs. Fix any carpet that is loose or worn.  Avoid having throw rugs at the top or bottom of the stairs. If you do have throw rugs, attach them to the floor with carpet tape.  Make sure that you have a light switch at the top of the stairs and the bottom of the stairs. If you do not have them, ask someone to add them for you. What else can I do to help prevent falls?  Wear shoes that:  Do not have high heels.  Have rubber bottoms.  Are comfortable and fit you well.  Are closed  at the toe. Do not wear sandals.  If you use a stepladder:  Make sure that it is fully opened. Do not climb a closed stepladder.  Make sure that both sides of the stepladder are locked into place.  Ask someone to hold it for you, if possible.  Clearly mark and make sure that you can see:  Any grab bars or handrails.  First and last steps.  Where the edge of each step is.  Use tools that help you move around (mobility aids) if they are needed. These include:  Canes.  Walkers.  Scooters.  Crutches.  Turn on the lights when you go into a dark area. Replace any light bulbs as soon as they burn out.  Set up your furniture so you have a clear path. Avoid moving your furniture around.  If any of your floors are uneven, fix them.  If there are any pets around you, be aware of where they are.  Review your medicines with your doctor. Some medicines can make you feel dizzy. This can increase your chance of falling. Ask your doctor what other things that you can do to help prevent falls. This information is not intended to replace advice given to you by your health care provider. Make sure you discuss any questions you have with your health care provider. Document Released: 03/18/2009 Document Revised: 10/28/2015 Document Reviewed: 06/26/2014 Elsevier Interactive Patient Education  2017 [employer].

## 2020-01-29 NOTE — Patient Instructions (Signed)
Here are some resources to help you if you feel you are in a mental health crisis:  National Suicide Prevention Lifeline - Call 1-800-273-8255  for help - Website with more resources: https://suicidepreventionlifeline.org/  [facility] Mobile Crisis Program - Call 336-538-1220 for help. - Mobile Crisis Program available 24 hours a day, 365 days a year. - Available for anyone of any age in [place] & [place] counties.  [facility] - Address: [address], [place] - Telephone: [phone]  - Hours of Operation: Sunday - Saturday - 8:00 a.m. - 8:00 p.m. - Medicaid, Medicare (Government Issued Only), BCBS, and Cash - Pay - Crisis Management, Outpatient Individual & Group Therapy, Psychiatrists on-site to provide medication management, In-Home Psychiatric Care, and Peer Support Care.  [facility] - Call 1-877-626-1772 for help. - Mobile Crisis Program available 24 hours a day, 365 days a year. - Available for anyone of any age in [place] & [place]    

## 2020-01-30 ENCOUNTER — Telehealth: Payer: Self-pay | Admitting: *Deleted

## 2020-01-30 ENCOUNTER — Encounter: Payer: Self-pay | Admitting: Family Medicine

## 2020-01-30 LAB — COMPLETE METABOLIC PANEL WITH GFR
AG Ratio: 1.2 (calc) (ref 1.0–2.5)
ALT: 13 U/L (ref 6–29)
AST: 17 U/L (ref 10–35)
Albumin: 4 g/dL (ref 3.6–5.1)
Alkaline phosphatase (APISO): 95 U/L (ref 37–153)
BUN/Creatinine Ratio: 17 (calc) (ref 6–22)
BUN: 28 mg/dL — ABNORMAL HIGH (ref 7–25)
CO2: 22 mmol/L (ref 20–32)
Calcium: 9.7 mg/dL (ref 8.6–10.4)
Chloride: 105 mmol/L (ref 98–110)
Creat: 1.66 mg/dL — ABNORMAL HIGH (ref 0.60–0.93)
GFR, Est African American: 34 mL/min/{1.73_m2} — ABNORMAL LOW (ref >=60)
GFR, Est Non African American: 29 mL/min/{1.73_m2} — ABNORMAL LOW (ref >=60)
Globulin: 3.3 g/dL (calc) (ref 1.9–3.7)
Glucose, Bld: 88 mg/dL (ref 65–139)
Potassium: 5.1 mmol/L (ref 3.5–5.3)
Sodium: 138 mmol/L (ref 135–146)
Total Bilirubin: 0.5 mg/dL (ref 0.2–1.2)
Total Protein: 7.3 g/dL (ref 6.1–8.1)

## 2020-01-30 LAB — LIPID PANEL
Cholesterol: 137 mg/dL (ref <200)
HDL: 45 mg/dL — ABNORMAL LOW (ref >=50)
LDL Cholesterol (Calc): 73 mg/dL (calc)
Non-HDL Cholesterol (Calc): 92 mg/dL (calc) (ref <130)
Total CHOL/HDL Ratio: 3 (calc) (ref <5.0)
Triglycerides: 111 mg/dL (ref <150)

## 2020-01-30 LAB — HEPATITIS C ANTIBODY
Hepatitis C Ab: NONREACTIVE
SIGNAL TO CUT-OFF: 0.01 (ref <1.00)

## 2020-01-30 MED ORDER — ACETAMINOPHEN ER 650 MG PO TBCR: 650.0000 mg | EXTENDED_RELEASE_TABLET | Freq: Four times a day (QID) | ORAL | Status: DC | PRN

## 2020-01-30 MED ORDER — LOSARTAN POTASSIUM 25 MG PO TABS: 25.0000 mg | ORAL_TABLET | Freq: Every morning | ORAL | 3 refills | Status: DC

## 2020-01-30 MED ORDER — ATORVASTATIN CALCIUM 10 MG PO TABS: 10.0000 mg | ORAL_TABLET | Freq: Every day | ORAL | 3 refills | Status: DC

## 2020-01-30 NOTE — Chronic Care Management (AMB) (Signed)
  Chronic Care Management   Note  01/30/2020 Name: AMAMDA CURBOW MRN: [id number] DOB: Aug 14, 1940  [name] Brockway is a 79 y.o. year old female who is a primary care patient of Delsa Grana, [place]. I reached out to [name] by phone today in response to a referral sent by Ms. [name] [place]'s PCP, Verline Lema, PA-C     Ms. Gable was given information about Chronic Care Management services today including:  1. CCM service includes personalized support from designated clinical staff supervised by her physician, including individualized plan of care and coordination with other care providers 2. 24/7 contact phone numbers for assistance for urgent and routine care needs. 3. Service will only be billed when office clinical staff spend 20 minutes or more in a month to coordinate care. 4. Only one practitioner may furnish and bill the service in a calendar month. 5. The patient may stop CCM services at any time (effective at the end of the month) by phone call to the office staff. 6. The patient will be responsible for cost sharing (co-pay) of up to 20% of the service fee (after annual deductible is met).  Patient agreed to services and verbal consent obtained.   Follow up plan: Telephone appointment with care management team member scheduled for: 02/05/2020  [place], [place] [facility]  [place], [place] [address] Direct Dial: [email or web address] Website: [email or web address]

## 2020-01-30 NOTE — Chronic Care Management (AMB) (Signed)
  Chronic Care Management   Outreach Note  01/30/2020 Name: ESLY SELVAGE MRN: [id number] DOB: Apr 27, 1941  [name] Bobo is a 79 y.o. year old female who is a primary care patient of Delsa Grana, [place]. I reached out to [name] by phone today in response to a referral sent by Ms. [name] Alesi's PCP, Delsa Grana, PA-C     An unsuccessful telephone outreach was attempted today. The patient was referred to the case management team for assistance with care management and care coordination.   Follow Up Plan: A HIPPA compliant phone message was left for the patient providing contact information and requesting a return call. The care management team will reach out to the patient again over the next 7 days. If patient returns call to provider office, please advise to call [place] at [phone].  [place], [place] [address] Direct Dial: [phone] [email or web address]: [email or web address]

## 2020-02-02 ENCOUNTER — Telehealth: Payer: Self-pay | Admitting: Family Medicine

## 2020-02-02 NOTE — Telephone Encounter (Signed)
Pt is returning office and NT call for lab results.    Please assist pt further.

## 2020-02-02 NOTE — Telephone Encounter (Signed)
Returned call to patient and documented in result note.

## 2020-02-05 ENCOUNTER — Telehealth: Payer: Medicare Other

## 2020-02-06 ENCOUNTER — Telehealth: Payer: Self-pay | Admitting: Family Medicine

## 2020-02-06 ENCOUNTER — Ambulatory Visit: Payer: Medicare Other | Admitting: *Deleted

## 2020-02-06 NOTE — Telephone Encounter (Signed)
Called patient to give her Stacey's phone number

## 2020-02-06 NOTE — Chronic Care Management (AMB) (Signed)
  Chronic Care Management   Social Work Note  02/06/2020 Name: Christine Burnett MRN: [id number] DOB: 1940/06/29  Christine Burnett is a 79 y.o. year old female who sees Delsa Grana, [place] for primary care. The CCM team was consulted for assistance with Mental Health Counseling and Resources.  Phone call to patient to complete initial assessment. Patient sounded tearful stating  that she had called the office to cancel today's appointment. This Education officer, museum assessed for safety, patient stated that she was not a harm to herself or others but just needed to reschedule. Initial assessment re-scheduled for 02/13/20.  SDOH (Social Determinants of Health) assessments performed: No     Outpatient Encounter Medications as of 02/06/2020  Medication Sig Note  . acetaminophen (TYLENOL) 650 MG CR tablet Take 1 tablet (650 mg total) by mouth 4 (four) times daily as needed for pain.   Marland Kitchen albuterol (VENTOLIN HFA) 108 (90 Base) MCG/ACT inhaler Inhale 2 puffs into the lungs every 6 (six) hours as needed for wheezing or shortness of breath. 01/29/2020: Rarely  . aspirin 81 MG EC tablet Take 81 mg by mouth daily. Swallow whole.   Marland Kitchen atorvastatin (LIPITOR) 10 MG tablet Take 1 tablet (10 mg total) by mouth at bedtime.   . Cholecalciferol (VITAMIN D-3) 1000 units CAPS Take 2 capsules by mouth daily. Takes occassionally   . Cyanocobalamin (VITAMIN B-12) 500 MCG SUBL One tablet dissolved under the tongue once a day (Patient taking differently: One tablet dissolved under the tongue once a day Takes occassionally)   . hydrOXYzine (ATARAX/VISTARIL) 10 MG tablet Take 1 tablet (10 mg total) by mouth every 8 (eight) hours as needed for anxiety.   Marland Kitchen losartan (COZAAR) 25 MG tablet Take 1 tablet (25 mg total) by mouth in the morning.   . melatonin 5 MG TABS Take 5 mg by mouth at bedtime. Pt taking 2 tabs qhs   . PARoxetine (PAXIL) 40 MG tablet Take 1 tablet (40 mg total) by mouth every morning.   . Probiotic Product (PROBIOTIC-10  PO) Take by mouth.    No facility-administered encounter medications on file as of 02/06/2020.    Goals Addressed   None     Follow Up Plan: Appointment scheduled for SW follow up with client by phone on: 02/13/20  [name], [place] Worker  [facility] Care Management [phone]

## 2020-02-06 NOTE — Telephone Encounter (Signed)
Patient is calling to reach social work to reschedule her appt with Laverda Sorenson. Patient states that there are too many ears listening on the telephone and she needs to reschedule for next week. Patient was offered Friday 02/13/20 Patient states she is unavailable that day. Please advise CB- [phone]

## 2020-02-11 ENCOUNTER — Telehealth: Payer: Medicare Other

## 2020-02-13 ENCOUNTER — Ambulatory Visit: Payer: Medicare Other | Admitting: *Deleted

## 2020-02-15 NOTE — Chronic Care Management (AMB) (Signed)
  Chronic Care Management   Social Work Note  02/15/2020 Name: Christine Burnett MRN: [id number] DOB: 1940-12-08  Christine Burnett is a 79 y.o. year old female who sees Christine Burnett, [place] for primary care. The CCM team was consulted for assistance with Mental Health Counseling and Resources.   Patient discussed increased family conflict, however states that the conflict has seemed to settle at this time. Patient allowed to express her feelings regarding the family conflict and assessed for safety. Emotional support provided including an emphasis on self care. Patient was grateful for the call, however declined any further follow up. Patient encouraged to call this social worker with any additional community resource needs.  SDOH (Social Determinants of Health) assessments performed: No     Outpatient Encounter Medications as of 02/13/2020  Medication Sig Note  . acetaminophen (TYLENOL) 650 MG CR tablet Take 1 tablet (650 mg total) by mouth 4 (four) times daily as needed for pain.   Marland Kitchen albuterol (VENTOLIN HFA) 108 (90 Base) MCG/ACT inhaler Inhale 2 puffs into the lungs every 6 (six) hours as needed for wheezing or shortness of breath. 01/29/2020: Rarely  . aspirin 81 MG EC tablet Take 81 mg by mouth daily. Swallow whole.   Marland Kitchen atorvastatin (LIPITOR) 10 MG tablet Take 1 tablet (10 mg total) by mouth at bedtime.   . Cholecalciferol (VITAMIN D-3) 1000 units CAPS Take 2 capsules by mouth daily. Takes occassionally   . Cyanocobalamin (VITAMIN B-12) 500 MCG SUBL One tablet dissolved under the tongue once a day (Patient taking differently: One tablet dissolved under the tongue once a day Takes occassionally)   . hydrOXYzine (ATARAX/VISTARIL) 10 MG tablet Take 1 tablet (10 mg total) by mouth every 8 (eight) hours as needed for anxiety.   Marland Kitchen losartan (COZAAR) 25 MG tablet Take 1 tablet (25 mg total) by mouth in the morning.   . melatonin 5 MG TABS Take 5 mg by mouth at bedtime. Pt taking 2 tabs qhs   .  PARoxetine (PAXIL) 40 MG tablet Take 1 tablet (40 mg total) by mouth every morning.   . Probiotic Product (PROBIOTIC-10 PO) Take by mouth.    No facility-administered encounter medications on file as of 02/13/2020.    Goals Addressed   None     Follow Up Plan: Client will call this social worker with any additional community resource needs  Christine Burnett, [place] Worker  [facility] Care Management [phone]

## 2020-03-10 ENCOUNTER — Other Ambulatory Visit: Payer: Self-pay

## 2020-03-10 ENCOUNTER — Ambulatory Visit (INDEPENDENT_AMBULATORY_CARE_PROVIDER_SITE_OTHER): Payer: Medicare Other | Admitting: Internal Medicine

## 2020-03-10 ENCOUNTER — Ambulatory Visit: Payer: Self-pay

## 2020-03-10 ENCOUNTER — Encounter: Payer: Self-pay | Admitting: Internal Medicine

## 2020-03-10 DIAGNOSIS — Z03818 Encounter for observation for suspected exposure to other biological agents ruled out: Secondary | ICD-10-CM | POA: Diagnosis not present

## 2020-03-10 DIAGNOSIS — R059 Cough, unspecified: Secondary | ICD-10-CM

## 2020-03-10 NOTE — Telephone Encounter (Signed)
Patient called stating that she has had a positive exposure to COVID-19.  She states that her daughter that lives with her has tested positive today.  Patient states that her symptoms started yesterday with mild cough "feeling chesty" a heaviness in her chest, sore throat and headache.  She states that she is not SOB She states she has treated herself with an inhaler.  She has history of kidney problems and was a smoker. She is refusing testing stating that she feels she just has a chest cold. Patient has not been vaccinated. Per protocol call placed to office. Patient transferred for scheduling.  Reason for Disposition . HIGH RISK for severe COVID complications (e.g., age > 86 years, obesity with BMI > 25, pregnant, chronic lung disease or other chronic medical condition)  (Exception: Already seen by PCP and no new or worsening symptoms.)  Answer Assessment - Initial Assessment Questions 1. COVID-19 DIAGNOSIS: "Who made your Coronavirus (COVID-19) diagnosis?" "Was it confirmed by a positive lab test?" If not diagnosed by a HCP, ask "Are there lots of cases (community spread) where you live?" (See public health department website, if unsure)    [place] 2. COVID-19 EXPOSURE: "Was there any known exposure to [place] before the symptoms began?" CDC Definition of close contact: within 6 feet (2 meters) for a total of 15 minutes or more over a 24-hour period.     Daughter lives in same home 3. ONSET: "When did the COVID-19 symptoms start?"     Yesterday late morning 4. WORST SYMPTOM: "What is your worst symptom?" (e.g., cough, fever, shortness of breath, muscle aches)     Cough mild, congestin in chest,  5. COUGH: "Do you have a cough?" If Yes, ask: "How bad is the cough?"      mild 6. FEVER: "Do you have a fever?" If Yes, ask: "What is your temperature, how was it measured, and when did it start?"    No 7. RESPIRATORY STATUS: "Describe your breathing?" (e.g., shortness of breath, wheezing, unable to  speak)     Walk without problem but sometimes feel SOB 8. BETTER-SAME-WORSE: "Are you getting better, staying the same or getting worse compared to yesterday?"  If getting worse, ask, "In what way?"     same 9. HIGH RISK DISEASE: "Do you have any chronic medical problems?" (e.g., asthma, heart or lung disease, weak immune system, obesity, etc.)    Former smoker, 10. PREGNANCY: "Is there any chance you are pregnant?" "When was your last menstrual period?"      N/A 11. OTHER SYMPTOMS: "Do you have any other symptoms?"  (e.g., chills, fatigue, headache, loss of smell or taste, muscle pain, sore throat; new loss of smell or taste especially support the diagnosis of COVID-19)      Sore throat headache  Protocols used: CORONAVIRUS (COVID-19) DIAGNOSED OR SUSPECTED-A-AH

## 2020-03-10 NOTE — Progress Notes (Signed)
Name: Christine Burnett   MRN: [id number]    DOB: [birth year]   Date:03/10/2020       Progress Note  Subjective  Chief Complaint  Chief Complaint  Patient presents with  . Covid Exposure    I connected with  [name] on 03/10/20 at  2:00 PM EDT by telephone and verified that I am speaking with the correct person using two identifiers.  I discussed the limitations, risks, security and privacy concerns of performing an evaluation and management service by telephone and the availability of in person appointments. The patient expressed understanding and agreed to proceed. Staff also discussed with the patient that there may be a patient responsible charge related to this service. Patient Location: Home Provider Location: [facility] Additional Individuals present: none  HPI Patient is an 79 year old female patient of Delsa Grana Last visit with her was 01/29/2020 Follows up today with a phone visit due to a Covid exposure concern  Daughter noted yesterday not feel good, tested two days ago, and told today was + They live together. When daughter was sx'ic, she stayed in her room mostly and wore mask   Patient denies any sx's except a dull HA, nose is dry, very minimal cough, no fever Also notes chest congestion feeling, no CP's,  Using albuterol inhaler yesterday and today and helps get a deep breath Not had vaccine  No marked SOB  Notes "mini mini mini" sore throat.  No mild congestion No loss of smell, loss of taste No N/V No muscle aches, some cramping noted No marked loose stools/diarrhea No CP, passing out episodes  Comorbid conditions reviewed Prior tob use, ? Some COPD history + prediabetes,  No major heart disease history + CKD,   No morbid obesity Wt Readings from Last 3 Encounters:  01/29/20 151 lb (68.5 kg)  01/29/20 151 lb (68.5 kg)  08/29/19 151 lb (68.5 kg)      Patient Active Problem List   Diagnosis Date Noted  . Prediabetes 08/13/2018  .  Major depressive disorder, recurrent ([place]) 06/13/2018  . Allergic rhinitis 09/27/2017  . 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 Chronic kidney disease, stage III (moderate) ([place]) 08/31/2017  . 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 Osteoporosis without current pathological fracture 03/30/2017  . Cardiac murmur 04/07/2015  . 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 Anxiety 01/05/2015  . Hyperlipidemia 01/05/2015  . HTN (hypertension) 09/24/2014    Past Surgical History:  Procedure Laterality Date  . COLONOSCOPY  2016  . ESOPHAGUS SURGERY    . INTRAMEDULLARY (IM) NAIL INTERTROCHANTERIC Left 03/26/2017   Procedure: INTRAMEDULLARY (IM) NAIL INTERTROCHANTRIC;  Surgeon: Hessie Knows, MD;  Location: [facility] ORS;  Service: Orthopedics;  Laterality: Left;    Family History  Problem Relation Age of Onset  . Other Mother        [place] posioning  . Heart attack Father   . Prostate cancer Father   . Heart attack Brother   . Diabetes Brother   . Hypertension Daughter   . Kidney cancer Neg Hx   . Bladder Cancer Neg Hx   . Colon cancer Neg Hx   . Colon polyps Neg Hx   . Rectal cancer Neg Hx     Social History   Tobacco Use  . Smoking status: Former Smoker    Packs/day: 0.25    Years: 61.00    Pack years: 15.25  Types: Cigarettes    Quit date: 03/25/2017    Years since quitting: 2.9  . Smokeless tobacco: Never Used  . Tobacco comment: smoking cessation materials not required  Substance Use Topics  . Alcohol use: No     Current Outpatient Medications:  .  acetaminophen (TYLENOL) 650 MG CR tablet, Take 1 tablet (650 mg total) by mouth 4 (four) times daily as needed for pain., Disp: , Rfl:  .  albuterol (VENTOLIN HFA) 108 (90 Base) MCG/ACT inhaler, Inhale 2 puffs into the lungs every 6 (six) hours as needed for wheezing or shortness of breath., Disp: 1 Inhaler,  Rfl: 2 .  aspirin 81 MG EC tablet, Take 81 mg by mouth daily. Swallow whole., Disp: , Rfl:  .  atorvastatin (LIPITOR) 10 MG tablet, Take 1 tablet (10 mg total) by mouth at bedtime., Disp: 90 tablet, Rfl: 3 .  Cholecalciferol (VITAMIN D-3) 1000 units CAPS, Take 2 capsules by mouth daily. Takes occassionally, Disp: , Rfl:  .  Cyanocobalamin (VITAMIN B-12) 500 MCG SUBL, One tablet dissolved under the tongue once a day (Patient taking differently: One tablet dissolved under the tongue once a day Takes occassionally), Disp: 100 tablet, Rfl: 3 .  hydrOXYzine (ATARAX/VISTARIL) 10 MG tablet, Take 1 tablet (10 mg total) by mouth every 8 (eight) hours as needed for anxiety., Disp: 60 tablet, Rfl: 3 .  melatonin 5 MG TABS, Take 5 mg by mouth at bedtime. Pt taking 2 tabs qhs, Disp: , Rfl:  .  PARoxetine (PAXIL) 40 MG tablet, Take 1 tablet (40 mg total) by mouth every morning., Disp: 90 tablet, Rfl: 3 .  Probiotic Product (PROBIOTIC-10 PO), Take by mouth., Disp: , Rfl:  .  losartan (COZAAR) 25 MG tablet, Take 1 tablet (25 mg total) by mouth in the morning. (Patient not taking: Reported on 03/10/2020), Disp: 90 tablet, Rfl: 3  Allergies  Allergen Reactions  . Cefuroxime Axetil     caused rash.  . Ciprofloxacin     Unknown  . Codeine     With staff assistance, above reviewed with the patient today.  ROS: As per HPI, otherwise no specific complaints on a limited and focused system review   Objective  Virtual encounter, vitals not obtained.  There is no height or weight on file to calculate BMI.  Physical Exam   Appears in NAD via conversation, no cough during our conversation, very pleasant, Pulmonary/Chest: No obvious respiratory distress. Speaking in complete sentences Neurological: Pt is alert, Speech is normal Psychiatric: Patient has a normal mood and affect, no flat affect noted, behavior is normal. Judgment and thought content normal.   No results found for this or any previous visit (from  the past 72 hour(s)).  PHQ2/9: Depression screen [facility] 2/9 03/10/2020 01/29/2020 08/29/2019 07/29/2019 06/27/2019  Decreased Interest 1 1 0 2 0  Down, Depressed, Hopeless 3 1 0 2 0  PHQ - 2 Score 4 2 0 4 0  Altered sleeping 0 1 0 1 1  Tired, decreased energy 1 1 0 0 0  Change in appetite 0 1 0 0 0  Feeling bad or failure about yourself  0 3 0 0 0  Trouble concentrating 0 0 0 0 0  Moving slowly or fidgety/restless 0 0 0 0 0  Suicidal thoughts 0 1 0 0 0  PHQ-9 Score 5 9 0 5 1  Difficult doing work/chores Somewhat difficult Somewhat difficult Not difficult at all Not difficult at all Not difficult at all  Some recent  data might be hidden   PHQ-2/9 Result reviewed  Fall Risk: Fall Risk  03/10/2020 01/29/2020 08/29/2019 07/29/2019 06/27/2019  Falls in the past year? 0 0 0 0 0  Number falls in past yr: 0 0 0 0 0  Injury with Fall? 0 0 0 0 0  Comment - - - - -  Risk Factor Category  - - - - -  Risk for fall due to : - Impaired balance/gait - History of fall(s);Impaired balance/gait;Impaired mobility -  Risk for fall due to: Comment - - - - -  Follow up - Falls prevention discussed - Follow up appointment -     Assessment & Plan  1. Encounter for patient concern about exposure to infectious organism 2. Cough  Noted to patient she did have a significant exposure to somebody that is Covid positive.  Since symptoms started in her daughter, she has tried to remain more isolated. The patient is starting to get some very low-grade symptoms, the significance of which is not totally clear presently.  I noted concerns that this may be the beginnings of a possible Covid infection, noting her significant exposure. Given her comorbidities which include CKD, her age, some element of lung dysfunction likely, and question of hypertension, do feel it would be helpful to know if she is Covid positive as she is likely a candidate for potentially monoclonal antibodies in the treatment regimen as we  discussed. Noted if her symptoms are persisting as the day progresses and into tomorrow, especially if there are increases in any symptoms as we discussed today, I would recommend getting tested for Covid, and she stated she would do so. If that returns positive, she should follow-up and discuss next steps. Recommended symptomatic measures if she has some low-grade symptoms, including Tylenol products, or Robitussin or Mucinex type product as an expectorant, and the importance of rest and staying well-hydrated.  Also, continued isolation from her daughter is important and wearing a mask can be helpful as well.  Await her clinical response, and she was understanding and very much in agreement with the above approach.  I discussed the assessment and treatment plan with the patient. The patient was provided an opportunity to ask questions and all were answered. The patient agreed with the plan and demonstrated an understanding of the instructions.   The patient was advised to call back or seek an in-person evaluation if the symptoms worsen or if the condition fails to improve as anticipated.  I provided 20 minutes of non-face-to-face time during this encounter that included discussing at length patient's sx/history, pertinent pmhx, medications, treatment and follow up plan. This time also included the necessary documentation, orders, and chart review.  Towanda Malkin, MD

## 2020-03-11 ENCOUNTER — Telehealth: Payer: Medicare Other | Admitting: Internal Medicine

## 2020-03-12 ENCOUNTER — Ambulatory Visit: Payer: Self-pay

## 2020-03-12 NOTE — Telephone Encounter (Signed)
Pt. Reports her daughter was positive for COVID 19 and she started feeling bad Tuesday. Went to [employer] and tested positive today. Pt. Has mild cough, fatigue, diarrhea, vomiting. Only vomits 1 x day. Reviewed home remedies and quarantine. Declines virtual visit at this time. Wants her PCP to offer further advice.  Reason for Disposition . [1] [id number] diagnosed by positive lab test AND [2] mild symptoms (e.g., cough, fever, others) AND [4] no complications or SOB  Answer Assessment - Initial Assessment Questions 1. COVID-19 DIAGNOSIS: "Who made your Coronavirus (COVID-19) diagnosis?" "Was it confirmed by a positive lab test?" If not diagnosed by a HCP, ask "Are there lots of cases (community spread) where you live?" (See public health department website, if unsure)     [facility] 2. COVID-19 EXPOSURE: "Was there any known exposure to [place] before the symptoms began?" CDC Definition of close contact: within 6 feet (2 meters) for a total of 15 minutes or more over a 24-hour period.      Yes 3. ONSET: "When did the COVID-19 symptoms start?"      Tuesday 4. WORST SYMPTOM: "What is your worst symptom?" (e.g., cough, fever, shortness of breath, muscle aches)     Fatigue, vomiting, diarrhea 5. COUGH: "Do you have a cough?" If Yes, ask: "How bad is the cough?"       Mild 6. FEVER: "Do you have a fever?" If Yes, ask: "What is your temperature, how was it measured, and when did it start?"     No 7. RESPIRATORY STATUS: "Describe your breathing?" (e.g., shortness of breath, wheezing, unable to speak)      With exertion 8. BETTER-SAME-WORSE: "Are you getting better, staying the same or getting worse compared to yesterday?"  If getting worse, ask, "In what way?"     Same 9. HIGH RISK DISEASE: "Do you have any chronic medical problems?" (e.g., asthma, heart or lung disease, weak immune system, obesity, etc.)     No 10. PREGNANCY: "Is there any chance you are pregnant?" "When was your last menstrual  period?"       No 11. OTHER SYMPTOMS: "Do you have any other symptoms?"  (e.g., chills, fatigue, headache, loss of smell or taste, muscle pain, sore throat; new loss of smell or taste especially support the diagnosis of COVID-19)       See above  Protocols used: CORONAVIRUS (COVID-19) DIAGNOSED OR SUSPECTED-A-AH

## 2020-03-12 NOTE — Telephone Encounter (Signed)
Called pt left detailed vm to do OTC meds for symptoms, any severe symptoms or worsening to go to ER.  Otherwise could schedule a virtual visit for next week.

## 2020-03-16 ENCOUNTER — Ambulatory Visit: Payer: Self-pay | Admitting: *Deleted

## 2020-03-16 DIAGNOSIS — J40 Bronchitis, not specified as acute or chronic: Secondary | ICD-10-CM

## 2020-03-16 MED ORDER — ALBUTEROL SULFATE HFA 108 (90 BASE) MCG/ACT IN AERS: 2.0000 | INHALATION_SPRAY | Freq: Four times a day (QID) | RESPIRATORY_TRACT | 0 refills | Status: DC | PRN

## 2020-03-16 NOTE — Telephone Encounter (Signed)
Requested medication (s) are due for refill today: Yes  Requested medication (s) are on the active medication list: Yes  Last refill:  11/12/18  Future visit scheduled: Yes  Notes to clinic:  Prescription has expired.    Requested Prescriptions  Pending Prescriptions Disp Refills   albuterol (VENTOLIN HFA) 108 (90 Base) MCG/ACT inhaler      Sig: Inhale 2 puffs into the lungs every 6 (six) hours as needed for wheezing or shortness of breath.      Pulmonology:  Beta Agonists Failed - 03/16/2020  2:28 PM      Failed - One inhaler should last at least one month. If the patient is requesting refills earlier, contact the patient to check for uncontrolled symptoms.      Passed - Valid encounter within last 12 months    Recent Outpatient Visits           6 days ago Encounter for patient concern about exposure to infectious organism   [facility] Towanda Malkin, MD   1 month ago Essential hypertension   [facility] Delsa Grana, PA-C   6 months ago Mild episode of recurrent major depressive disorder [facility])   [facility] Delsa Grana, PA-C   7 months ago [facility] Delsa Grana, PA-C   8 months ago Acute midline low back pain, unspecified whether sciatica present   [facility] Delsa Grana, PA-C       Future Appointments             In 1 month Delsa Grana, PA-C [facility], [facility]

## 2020-03-16 NOTE — Telephone Encounter (Signed)
Patient requesting refill on albuterol inhaler. reports SOB and cough, headache. Denies fever, difficulty breathing. Patient denies need for appt or seeing PCP only needs inhaler at this time. Care advise given. Patient verbalized understanding of care advise and to call back or go to ED if symptoms worsen.   Reason for Disposition . HIGH RISK for severe COVID complications (e.g., age > 57 years, obesity with BMI > 25, pregnant, chronic lung disease or other chronic medical condition)  (Exception: Already seen by PCP and no new or worsening symptoms.)  Answer Assessment - Initial Assessment Questions 1. COVID-19 DIAGNOSIS: "Who made your Coronavirus (COVID-19) diagnosis?" "Was it confirmed by a positive lab test?" If not diagnosed by a HCP, ask "Are there lots of cases (community spread) where you live?" (See public health department website, if unsure)     [facility] tested positive on 03/12/20 2. COVID-19 EXPOSURE: "Was there any known exposure to COVID before the symptoms began?" CDC Definition of close contact: within 6 feet (2 meters) for a total of 15 minutes or more over a 24-hour period.      Exposed to daughter  3. ONSET: "When did the COVID-19 symptoms start?"      Since Friday  4. WORST SYMPTOM: "What is your worst symptom?" (e.g., cough, fever, shortness of breath, muscle aches)     Shortness of breath  5. COUGH: "Do you have a cough?" If Yes, ask: "How bad is the cough?"       Yes not bad 6. FEVER: "Do you have a fever?" If Yes, ask: "What is your temperature, how was it measured, and when did it start?"     denies 7. RESPIRATORY STATUS: "Describe your breathing?" (e.g., shortness of breath, wheezing, unable to speak)      SOB 8. BETTER-SAME-WORSE: "Are you getting better, staying the same or getting worse compared to yesterday?"  If getting worse, ask, "In what way?"     worse 9. HIGH RISK DISEASE: "Do you have any chronic medical problems?" (e.g., asthma, heart or lung disease,  weak immune system, obesity, etc.)     Denies  10. PREGNANCY: "Is there any chance you are pregnant?" "When was your last menstrual period?"       na 11. OTHER SYMPTOMS: "Do you have any other symptoms?"  (e.g., chills, fatigue, headache, loss of smell or taste, muscle pain, sore throat; new loss of smell or taste especially support the diagnosis of COVID-19)       headache  Protocols used: CORONAVIRUS (COVID-19) DIAGNOSED OR SUSPECTED-A-AH

## 2020-03-16 NOTE — Telephone Encounter (Signed)
Pt.notified

## 2020-03-17 ENCOUNTER — Emergency Department: Payer: Medicare Other

## 2020-03-17 ENCOUNTER — Encounter: Payer: Self-pay | Admitting: *Deleted

## 2020-03-17 ENCOUNTER — Other Ambulatory Visit: Payer: Self-pay

## 2020-03-17 ENCOUNTER — Inpatient Hospital Stay
Admission: EM | Admit: 2020-03-17 | Discharge: 2020-03-19 | DRG: 177 | Disposition: A | Discharge status: Home Health | Payer: Medicare Other | Attending: Family Medicine | Admitting: Family Medicine

## 2020-03-17 DIAGNOSIS — Z881 Allergy status to other antibiotic agents status: Secondary | ICD-10-CM | POA: Diagnosis not present

## 2020-03-17 DIAGNOSIS — U071 COVID-19: Principal | ICD-10-CM | POA: Diagnosis present

## 2020-03-17 DIAGNOSIS — D709 Neutropenia, unspecified: Secondary | ICD-10-CM | POA: Diagnosis not present

## 2020-03-17 DIAGNOSIS — Z7982 Long term (current) use of aspirin: Secondary | ICD-10-CM | POA: Diagnosis not present

## 2020-03-17 DIAGNOSIS — R531 Weakness: Secondary | ICD-10-CM | POA: Diagnosis present

## 2020-03-17 DIAGNOSIS — E785 Hyperlipidemia, unspecified: Secondary | ICD-10-CM | POA: Diagnosis not present

## 2020-03-17 DIAGNOSIS — M810 Age-related osteoporosis without current pathological fracture: Secondary | ICD-10-CM | POA: Diagnosis present

## 2020-03-17 DIAGNOSIS — Z87891 Personal history of nicotine dependence: Secondary | ICD-10-CM

## 2020-03-17 DIAGNOSIS — F32A Depression, unspecified: Secondary | ICD-10-CM | POA: Diagnosis present

## 2020-03-17 DIAGNOSIS — N1832 Chronic kidney disease, stage 3b: Secondary | ICD-10-CM | POA: Diagnosis present

## 2020-03-17 DIAGNOSIS — Z8249 Family history of ischemic heart disease and other diseases of the circulatory system: Secondary | ICD-10-CM

## 2020-03-17 DIAGNOSIS — J1282 Pneumonia due to coronavirus disease 2019: Secondary | ICD-10-CM | POA: Diagnosis not present

## 2020-03-17 DIAGNOSIS — Z8042 Family history of malignant neoplasm of prostate: Secondary | ICD-10-CM | POA: Diagnosis not present

## 2020-03-17 DIAGNOSIS — E782 Mixed hyperlipidemia: Secondary | ICD-10-CM | POA: Diagnosis not present

## 2020-03-17 DIAGNOSIS — R918 Other nonspecific abnormal finding of lung field: Secondary | ICD-10-CM | POA: Diagnosis not present

## 2020-03-17 DIAGNOSIS — G4734 Idiopathic sleep related nonobstructive alveolar hypoventilation: Secondary | ICD-10-CM

## 2020-03-17 DIAGNOSIS — R0902 Hypoxemia: Secondary | ICD-10-CM | POA: Diagnosis present

## 2020-03-17 DIAGNOSIS — J189 Pneumonia, unspecified organism: Secondary | ICD-10-CM | POA: Diagnosis not present

## 2020-03-17 DIAGNOSIS — Z79899 Other long term (current) drug therapy: Secondary | ICD-10-CM | POA: Diagnosis not present

## 2020-03-17 DIAGNOSIS — F419 Anxiety disorder, unspecified: Secondary | ICD-10-CM | POA: Diagnosis present

## 2020-03-17 DIAGNOSIS — I129 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 stage 4 chronic kidney disease, or unspecified chronic kidney disease: Secondary | ICD-10-CM | POA: Diagnosis not present

## 2020-03-17 DIAGNOSIS — I10 Essential (primary) hypertension: Secondary | ICD-10-CM | POA: Diagnosis present

## 2020-03-17 DIAGNOSIS — Z833 Family history of diabetes mellitus: Secondary | ICD-10-CM | POA: Diagnosis not present

## 2020-03-17 DIAGNOSIS — R7989 Other specified abnormal findings of blood chemistry: Secondary | ICD-10-CM | POA: Diagnosis present

## 2020-03-17 DIAGNOSIS — Z885 Allergy status to narcotic agent status: Secondary | ICD-10-CM | POA: Diagnosis not present

## 2020-03-17 DIAGNOSIS — Z8673 Personal history of transient ischemic attack (TIA), and cerebral infarction without residual deficits: Secondary | ICD-10-CM

## 2020-03-17 DIAGNOSIS — J1289 Other viral pneumonia: Secondary | ICD-10-CM | POA: Diagnosis not present

## 2020-03-17 DIAGNOSIS — J439 Emphysema, unspecified: Secondary | ICD-10-CM | POA: Diagnosis not present

## 2020-03-17 LAB — CBC WITH DIFFERENTIAL/PLATELET
Abs Immature Granulocytes: 0.02 10*3/uL (ref 0.00–0.07)
Basophils Absolute: 0 10*3/uL (ref 0.0–0.1)
Basophils Relative: 1 %
Eosinophils Absolute: 0 10*3/uL (ref 0.0–0.5)
Eosinophils Relative: 1 %
HCT: 37.6 % (ref 36.0–46.0)
Hemoglobin: 13 g/dL (ref 12.0–15.0)
Immature Granulocytes: 1 %
Lymphocytes Relative: 28 %
Lymphs Abs: 1.1 10*3/uL (ref 0.7–4.0)
MCH: 33.2 pg (ref 26.0–34.0)
MCHC: 34.6 g/dL (ref 30.0–36.0)
MCV: 96.2 fL (ref 80.0–100.0)
Monocytes Absolute: 0.2 10*3/uL (ref 0.1–1.0)
Monocytes Relative: 6 %
Neutro Abs: 2.5 10*3/uL (ref 1.7–7.7)
Neutrophils Relative %: 63 %
Platelets: 152 10*3/uL (ref 150–400)
RBC: 3.91 MIL/uL (ref 3.87–5.11)
RDW: 13.2 % (ref 11.5–15.5)
WBC: 3.8 10*3/uL — ABNORMAL LOW (ref 4.0–10.5)
nRBC: 0 % (ref 0.0–0.2)

## 2020-03-17 LAB — RESPIRATORY PANEL BY RT PCR (FLU A&B, COVID)
Influenza A by PCR: NEGATIVE
Influenza B by PCR: NEGATIVE
SARS Coronavirus 2 by RT PCR: POSITIVE — AB

## 2020-03-17 LAB — PROTIME-INR
INR: 0.9 (ref 0.8–1.2)
Prothrombin Time: 12.2 seconds (ref 11.4–15.2)

## 2020-03-17 LAB — PROCALCITONIN: Procalcitonin: <0.1 ng/mL

## 2020-03-17 LAB — COMPREHENSIVE METABOLIC PANEL
ALT: 17 U/L (ref 0–44)
AST: 29 U/L (ref 15–41)
Albumin: 3.9 g/dL (ref 3.5–5.0)
Alkaline Phosphatase: 75 U/L (ref 38–126)
Anion gap: 9 (ref 5–15)
BUN: 22 mg/dL (ref 8–23)
CO2: 24 mmol/L (ref 22–32)
Calcium: 9.1 mg/dL (ref 8.9–10.3)
Chloride: 103 mmol/L (ref 98–111)
Creatinine, Ser: 1.67 mg/dL — ABNORMAL HIGH (ref 0.44–1.00)
GFR, Estimated: 29 mL/min — ABNORMAL LOW (ref >60)
Glucose, Bld: 116 mg/dL — ABNORMAL HIGH (ref 70–99)
Potassium: 4.1 mmol/L (ref 3.5–5.1)
Sodium: 136 mmol/L (ref 135–145)
Total Bilirubin: 0.7 mg/dL (ref 0.3–1.2)
Total Protein: 7.8 g/dL (ref 6.5–8.1)

## 2020-03-17 LAB — LACTIC ACID, PLASMA: Lactic Acid, Venous: 1.5 mmol/L (ref 0.5–1.9)

## 2020-03-17 LAB — FIBRIN DERIVATIVES D-DIMER (ARMC ONLY): Fibrin derivatives D-dimer (ARMC): 2779.32 ng/mL (FEU) — ABNORMAL HIGH (ref 0.00–499.00)

## 2020-03-17 LAB — C-REACTIVE PROTEIN: CRP: 1 mg/dL — ABNORMAL HIGH (ref <1.0)

## 2020-03-17 MED ORDER — SODIUM CHLORIDE 0.9 % IV SOLN
100.0000 mg | Freq: Every day | INTRAVENOUS | Status: DC
  Administered 2020-03-18 – 2020-03-19 (×2): 100 mg via INTRAVENOUS
  Filled 2020-03-17 (×3): qty 20

## 2020-03-17 MED ORDER — SODIUM CHLORIDE 0.9 % IV SOLN: 100.0000 mg | Freq: Every day | INTRAVENOUS | Status: DC

## 2020-03-17 MED ORDER — SODIUM CHLORIDE 0.9 % IV SOLN
200.0000 mg | Freq: Once | INTRAVENOUS | Status: AC
  Administered 2020-03-17: 200 mg via INTRAVENOUS
  Filled 2020-03-17: qty 200

## 2020-03-17 MED ORDER — ADULT MULTIVITAMIN W/MINERALS CH
1.0000 | ORAL_TABLET | Freq: Every day | ORAL | Status: DC
  Administered 2020-03-17 – 2020-03-19 (×3): 1 via ORAL
  Filled 2020-03-17 (×3): qty 1

## 2020-03-17 MED ORDER — ALBUTEROL SULFATE HFA 108 (90 BASE) MCG/ACT IN AERS
2.0000 | INHALATION_SPRAY | Freq: Four times a day (QID) | RESPIRATORY_TRACT | Status: DC
  Administered 2020-03-17 – 2020-03-19 (×6): 2 via RESPIRATORY_TRACT
  Filled 2020-03-17: qty 6.7

## 2020-03-17 MED ORDER — ENOXAPARIN SODIUM 30 MG/0.3ML ~~LOC~~ SOLN
30.0000 mg | SUBCUTANEOUS | Status: DC
  Administered 2020-03-18 (×2): 30 mg via SUBCUTANEOUS
  Filled 2020-03-17 (×3): qty 0.3

## 2020-03-17 MED ORDER — METHYLPREDNISOLONE SODIUM SUCC 125 MG IJ SOLR
70.0000 mg | Freq: Once | INTRAMUSCULAR | Status: AC
  Administered 2020-03-17: 70 mg via INTRAVENOUS
  Filled 2020-03-17: qty 2

## 2020-03-17 MED ORDER — ATORVASTATIN CALCIUM 20 MG PO TABS
10.0000 mg | ORAL_TABLET | Freq: Every day | ORAL | Status: DC
  Administered 2020-03-17 – 2020-03-18 (×2): 10 mg via ORAL
  Filled 2020-03-17 (×2): qty 1

## 2020-03-17 MED ORDER — ASPIRIN EC 81 MG PO TBEC
81.0000 mg | DELAYED_RELEASE_TABLET | Freq: Every day | ORAL | Status: DC
  Administered 2020-03-17 – 2020-03-19 (×3): 81 mg via ORAL
  Filled 2020-03-17 (×3): qty 1

## 2020-03-17 MED ORDER — ONDANSETRON HCL 4 MG PO TABS: 4.0000 mg | ORAL_TABLET | Freq: Four times a day (QID) | ORAL | Status: DC | PRN

## 2020-03-17 MED ORDER — SODIUM CHLORIDE 0.9 % IV SOLN: 200.0000 mg | Freq: Once | INTRAVENOUS | Status: DC

## 2020-03-17 MED ORDER — HYDROCOD POLST-CPM POLST ER 10-8 MG/5ML PO SUER
5.0000 mL | Freq: Two times a day (BID) | ORAL | Status: DC | PRN
  Administered 2020-03-18: 5 mL via ORAL
  Filled 2020-03-17: qty 5

## 2020-03-17 MED ORDER — ENOXAPARIN SODIUM 40 MG/0.4ML ~~LOC~~ SOLN: 40.0000 mg | SUBCUTANEOUS | Status: DC

## 2020-03-17 MED ORDER — PAROXETINE HCL 20 MG PO TABS
40.0000 mg | ORAL_TABLET | ORAL | Status: DC
  Administered 2020-03-18 – 2020-03-19 (×2): 40 mg via ORAL
  Filled 2020-03-17 (×2): qty 2

## 2020-03-17 MED ORDER — SODIUM CHLORIDE 0.9 % IV BOLUS
1000.0000 mL | Freq: Once | INTRAVENOUS | Status: AC
  Administered 2020-03-17: 1000 mL via INTRAVENOUS

## 2020-03-17 MED ORDER — ZINC SULFATE 220 (50 ZN) MG PO CAPS
220.0000 mg | ORAL_CAPSULE | Freq: Every day | ORAL | Status: DC
  Administered 2020-03-17 – 2020-03-19 (×3): 220 mg via ORAL
  Filled 2020-03-17 (×3): qty 1

## 2020-03-17 MED ORDER — ACETAMINOPHEN 325 MG PO TABS: 650.0000 mg | ORAL_TABLET | Freq: Four times a day (QID) | ORAL | Status: DC | PRN

## 2020-03-17 MED ORDER — ALBUTEROL SULFATE HFA 108 (90 BASE) MCG/ACT IN AERS
4.0000 | INHALATION_SPRAY | Freq: Once | RESPIRATORY_TRACT | Status: AC
  Administered 2020-03-17: 4 via RESPIRATORY_TRACT
  Filled 2020-03-17: qty 6.7

## 2020-03-17 MED ORDER — GUAIFENESIN-DM 100-10 MG/5ML PO SYRP
10.0000 mL | ORAL_SOLUTION | ORAL | Status: DC | PRN
  Filled 2020-03-17: qty 10

## 2020-03-17 MED ORDER — ASCORBIC ACID 500 MG PO TABS
500.0000 mg | ORAL_TABLET | Freq: Every day | ORAL | Status: DC
  Administered 2020-03-17 – 2020-03-19 (×3): 500 mg via ORAL
  Filled 2020-03-17 (×3): qty 1

## 2020-03-17 MED ORDER — ONDANSETRON HCL 4 MG/2ML IJ SOLN
4.0000 mg | Freq: Four times a day (QID) | INTRAMUSCULAR | Status: DC | PRN
  Administered 2020-03-17: 4 mg via INTRAVENOUS
  Filled 2020-03-17: qty 2

## 2020-03-17 MED ORDER — HYDROXYZINE HCL 10 MG PO TABS
10.0000 mg | ORAL_TABLET | Freq: Three times a day (TID) | ORAL | Status: DC | PRN
  Filled 2020-03-17: qty 1

## 2020-03-17 NOTE — ED Notes (Signed)
Pt reports nausea with 1 episode of vomiting

## 2020-03-17 NOTE — Progress Notes (Signed)
PHARMACIST - PHYSICIAN COMMUNICATION  CONCERNING:  Enoxaparin (Lovenox) for DVT Prophylaxis    RECOMMENDATION: Patient was prescribed enoxaprin 40mg  q24 hours for VTE prophylaxis.   Filed Weights   03/17/20 1531  Weight: 68 kg (150 lb)    Body mass index is 23.49 kg/m.  Estimated Creatinine Clearance: 26.1 mL/min (A) (by C-G formula based on SCr of 1.67 mg/dL (H)).   Patient is candidate for enoxaparin 30mg  every 24 hours based on CrCl <50ml/min or Weight <45kg  DESCRIPTION: Pharmacy has adjusted enoxaparin dose per [facility] policy.  Patient is now receiving enoxaparin 30mg  every [place], PharmD Pharmacy Resident  03/17/2020 7:38 PM

## 2020-03-17 NOTE — ED Provider Notes (Signed)
[facility] Emergency Department Provider Note  ____________________________________________   First MD Initiated Contact with Patient 03/17/20 1646     (approximate)  I have reviewed the triage vital signs and the nursing notes.   HISTORY  Chief Complaint Shortness of Breath and Chills    HPI Christine Burnett is a 79 y.o. female  Here with shortness of breath, fatigue, chills. Pt reports that her sx started approximately 1 week ago with cough, fever, and fatigue. Her daughter, who lives with her, was diagnosed with COVID and pt tested pos one week ago. She's had poor appetite, persistent cough since then. Over the last 24 hours, she has now developed worsening SOB and malaise. She has had difficulty getting around the house 2/2 this fatigue. She's had nausea but no vomiting, no diarrhea. No CP. No leg swelling. No specific alleviating or aggravating factors. She has not taken anything for this. Not vaccinated.        Past Medical History:  Diagnosis Date  . Allergic rhinitis   . Anxiety   . Atrial fibrillation ([place]) 08/09/2017  . Chronic bronchitis ([place])   . Chronic kidney disease, stage III (moderate) ([place]) 08/31/2017   Seeing nephrologist  . CKD (chronic kidney disease), stage III ([place])   . Cornea scar   . Depression    unspecified  . Diverticulosis   . Hx of fracture of left hip 12/24/2017  . Hyperlipidemia   . 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 Hypertension   . Osteoporosis   . Risk for falls   . Situational disturbance   . TIA (transient ischemic attack)     Patient Active Problem List   Diagnosis Date Noted  . Pneumonia due to COVID-19 virus 03/17/2020  . Hypoxia 03/17/2020  . Prediabetes 08/13/2018  . Major depressive disorder, recurrent ([place]) 06/13/2018  . Allergic rhinitis 09/27/2017  . 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 Chronic kidney disease, stage 3b ([place]) 08/31/2017  .  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 Osteoporosis without current pathological fracture 03/30/2017  . Cardiac murmur 04/07/2015  . 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 Anxiety 01/05/2015  . Hyperlipidemia 01/05/2015  . HTN (hypertension) 09/24/2014    Past Surgical History:  Procedure Laterality Date  . COLONOSCOPY  2016  . ESOPHAGUS SURGERY    . INTRAMEDULLARY (IM) NAIL INTERTROCHANTERIC Left 03/26/2017   Procedure: INTRAMEDULLARY (IM) NAIL INTERTROCHANTRIC;  Surgeon: Hessie Knows, MD;  Location: [facility] ORS;  Service: Orthopedics;  Laterality: Left;    Prior to Admission medications   Medication Sig Start Date End Date Taking? Authorizing Provider  albuterol (VENTOLIN HFA) 108 (90 Base) MCG/ACT inhaler Inhale 2 puffs into the lungs every 6 (six) hours as needed for wheezing or shortness of breath. 03/16/20  Yes Delsa Grana, PA-C  aspirin 81 MG EC tablet Take 81 mg by mouth daily. Swallow whole.   Yes [provider]  atorvastatin (LIPITOR) 10 MG tablet Take 1 tablet (10 mg total) by mouth at bedtime. 01/30/20  Yes Delsa Grana, PA-C  Cholecalciferol (VITAMIN D-3) 1000 units CAPS Take 2 capsules by mouth daily. Takes occassionally 08/09/17  Yes Lada, Satira Anis, MD  hydrOXYzine (ATARAX/VISTARIL) 10 MG tablet Take 1 tablet (10 mg total) by mouth every 8 (eight) hours as needed for anxiety. 01/29/20  Yes Delsa Grana, PA-C  melatonin 5 MG TABS Take 5 mg by  mouth at bedtime. Pt taking 2 tabs qhs   Yes [provider]  PARoxetine (PAXIL) 40 MG tablet Take 1 tablet (40 mg total) by mouth every morning. 01/29/20  Yes Delsa Grana, PA-C  Probiotic Product (PROBIOTIC-10 PO) Take by mouth.   Yes [provider]  acetaminophen (TYLENOL) 650 MG CR tablet Take 1 tablet (650 mg total) by mouth 4 (four) times daily as needed for pain. 01/30/20   Delsa Grana,  PA-C  Cyanocobalamin (VITAMIN B-12) 500 MCG SUBL One tablet dissolved under the tongue once a day Patient not taking: Reported on 03/17/2020 05/13/18   Arnetha Courser, MD  losartan (COZAAR) 25 MG tablet Take 1 tablet (25 mg total) by mouth in the morning. Patient not taking: Reported on 03/10/2020 01/30/20   Delsa Grana, PA-C    Allergies Cefuroxime axetil, Ciprofloxacin, and Codeine  Family History  Problem Relation Age of Onset  . Other Mother        [place] posioning  . Heart attack Father   . Prostate cancer Father   . Heart attack Brother   . Diabetes Brother   . Hypertension Daughter   . Kidney cancer Neg Hx   . Bladder Cancer Neg Hx   . Colon cancer Neg Hx   . Colon polyps Neg Hx   . Rectal cancer Neg Hx     Social History Social History   Tobacco Use  . Smoking status: Former Smoker    Packs/day: 0.25    Years: 61.00    Pack years: 15.25    Types: Cigarettes    Quit date: 03/25/2017    Years since quitting: 2.9  . Smokeless tobacco: Never Used  . Tobacco comment: smoking cessation materials not required  Vaping Use  . Vaping Use: Never used  Substance Use Topics  . Alcohol use: No  . Drug use: No    Review of Systems  Review of Systems  Constitutional: Positive for chills, fatigue and fever.  HENT: Negative for sore throat.   Respiratory: Positive for cough and shortness of breath.   Cardiovascular: Positive for chest pain.  Gastrointestinal: Negative for abdominal pain.  Genitourinary: Negative for flank pain.  Musculoskeletal: Negative for neck pain.  Skin: Negative for rash and wound.  Allergic/Immunologic: Negative for immunocompromised state.  Neurological: Positive for weakness. Negative for numbness.  Hematological: Does not bruise/bleed easily.  All other systems reviewed and are negative.    ____________________________________________  PHYSICAL EXAM:      VITAL SIGNS: ED Triage Vitals  Enc Vitals Group     BP 03/17/20 1530 (!)  119/93     Pulse Rate 03/17/20 1530 93     Resp 03/17/20 1530 (!) 40     Temp 03/17/20 1530 98.2 F (36.8 C)     Temp Source 03/17/20 1530 Oral     SpO2 03/17/20 1530 97 %     Weight 03/17/20 1531 150 lb (68 kg)     Height 03/17/20 1531 5\' 7"  (1.702 m)     Head Circumference --      Peak Flow --      Pain Score --      Pain Loc --      Pain Edu? --      Excl. in [place]? --      Physical Exam    ____________________________________________   LABS (all labs ordered are listed, but only abnormal results are displayed)  Labs Reviewed  COMPREHENSIVE METABOLIC PANEL - Abnormal; Notable for the  following components:      Result Value   Glucose, Bld 116 (*)    Creatinine, Ser 1.67 (*)    GFR, Estimated 29 (*)    All other components within normal limits  CBC WITH DIFFERENTIAL/PLATELET - Abnormal; Notable for the following components:   WBC 3.8 (*)    All other components within normal limits  FIBRIN DERIVATIVES D-DIMER ([facility] ONLY) - Abnormal; Notable for the following components:   Fibrin derivatives D-dimer [facility]) 7,619.50 (*)    All other components within normal limits  CULTURE, BLOOD (ROUTINE X 2)  CULTURE, BLOOD (ROUTINE X 2)  RESPIRATORY PANEL BY RT PCR (FLU A&B, COVID)  LACTIC ACID, PLASMA  PROTIME-INR  PROCALCITONIN  URINALYSIS, COMPLETE (UACMP) WITH MICROSCOPIC  C-REACTIVE PROTEIN  CBC WITH DIFFERENTIAL/PLATELET  COMPREHENSIVE METABOLIC PANEL  C-REACTIVE PROTEIN  FERRITIN  FIBRIN DERIVATIVES D-DIMER ([facility] ONLY)    ____________________________________________  EKG:  ________________________________________  RADIOLOGY All imaging, including plain films, CT scans, and ultrasounds, independently reviewed by me, and interpretations confirmed via formal radiology reads.  ED MD interpretation:   CXR: Right ML PNA, diffuse ill defined opacities c/w COVID-19  Official radiology report(s): DG Chest 2 View  Result Date: 03/17/2020 CLINICAL DATA:  COVID-19  positive 6 days prior EXAM: CHEST - 2 VIEW COMPARISON:  Radiograph 03/25/2017 FINDINGS: Rounded masslike opacity present in the right infrahilar lung. Additional patchy and streaky opacities are present in the left infrahilar lung as well. No pneumothorax or effusion. Chronic hyperinflation with emphysematous features. The aorta is calcified. The remaining cardiomediastinal contours are unremarkable. No acute osseous or soft tissue abnormality. Degenerative changes are present in the imaged spine and shoulders. IMPRESSION: Rounded masslike opacity in the right infrahilar lung, could reflect an acute pneumonia in the setting of COVID-19 given some additional streaky and ill-defined opacities elsewhere. However, this finding warrants at minimum short-term (6 week) follow-up imaging after therapeutic intervention to ensure resolution, otherwise cross-sectional imaging should be obtained. Electronically Signed   By: Lovena Le M.D.   On: 03/17/2020 16:26    ____________________________________________  PROCEDURES   Procedure(s) performed (including Critical Care):  Procedures  ____________________________________________  INITIAL IMPRESSION / MDM / [place] / ED COURSE  As part of my medical decision making, I reviewed the following data within the [place] notes reviewed and incorporated, Old chart reviewed, Notes from prior ED visits, and [place] Controlled Substance [place] was evaluated in Emergency Department on 03/17/2020 for the symptoms described in the history of present illness. She was evaluated in the context of the global COVID-19 pandemic, which necessitated consideration that the patient might be at risk for infection with the SARS-CoV-2 virus that causes COVID-19. Institutional protocols and algorithms that pertain to the evaluation of patients at risk for COVID-19 are in a state of rapid change based on information  released by regulatory bodies including the CDC and federal and state organizations. These policies and algorithms were followed during the patient's care in the ED.  Some ED evaluations and interventions may be delayed as a result of limited staffing during the pandemic.*     Medical Decision Making:  79 yo F here with generalized weakness, SOB in setting of COVID-19. Desats to 89 here with marked tachypnea w/ light exertion, and has been having difficulty caring for herself at home. Lab work shows baseline CKD, markedly elevated D-Dimer but unable to obtain CT at this time 2/2 her CKD.  Will hydrate and defer to Hospitalist. Otherwise, CXR reviewed, c/w COVID-19. Procal neg, without signs of bacterial superinfection. LA normal. Admit to medicine. Remdesevir and steroids started with nebs given h/o smoking, requiring alb in past  ____________________________________________  FINAL CLINICAL IMPRESSION(S) / ED DIAGNOSES  Final diagnoses:  Pneumonia due to COVID-19 virus     MEDICATIONS GIVEN DURING THIS VISIT:  Medications  remdesivir 200 mg in sodium chloride 0.9% 250 mL IVPB (200 mg Intravenous New Bag/Given 03/17/20 1934)    Followed by  remdesivir 100 mg in sodium chloride 0.9 % 100 mL IVPB (has no administration in time range)  atorvastatin (LIPITOR) tablet 10 mg (has no administration in time range)  aspirin EC tablet 81 mg (has no administration in time range)  PARoxetine (PAXIL) tablet 40 mg (has no administration in time range)  hydrOXYzine (ATARAX/VISTARIL) tablet 10 mg (has no administration in time range)  albuterol (VENTOLIN HFA) 108 (90 Base) MCG/ACT inhaler 2 puff (has no administration in time range)  guaiFENesin-dextromethorphan (ROBITUSSIN DM) 100-10 MG/5ML syrup 10 mL (has no administration in time range)  chlorpheniramine-HYDROcodone (TUSSIONEX) 10-8 MG/5ML suspension 5 mL (has no administration in time range)  ascorbic acid (VITAMIN C) tablet 500 mg (has no  administration in time range)  zinc sulfate capsule 220 mg (has no administration in time range)  acetaminophen (TYLENOL) tablet 650 mg (has no administration in time range)  ondansetron (ZOFRAN) tablet 4 mg (has no administration in time range)    Or  ondansetron (ZOFRAN) injection 4 mg (has no administration in time range)  multivitamin with minerals tablet 1 tablet (has no administration in time range)  enoxaparin (LOVENOX) injection 30 mg (has no administration in time range)  sodium chloride 0.9 % bolus 1,000 mL (0 mLs Intravenous Stopped 03/17/20 1857)  methylPREDNISolone sodium succinate (SOLU-MEDROL) 125 mg/2 mL injection 70 mg (70 mg Intravenous Given 03/17/20 1830)  albuterol (VENTOLIN HFA) 108 (90 Base) MCG/ACT inhaler 4 puff (4 puffs Inhalation Given 03/17/20 1833)     ED Discharge Orders    None       Note:  This document was prepared using Dragon voice recognition software and may include unintentional dictation errors.   Duffy Bruce, MD 03/17/20 [phone]

## 2020-03-17 NOTE — Consult Note (Signed)
Remdesivir - Pharmacy Brief Note   O:  ALT: 17 CXR: Rounded masslike opacity in the right infrahilar lung, could reflect an acute pneumonia in the setting of COVID-19 given some additional streaky and ill-defined opacities elsewhere SpO2: 95% on RA; pt c/o SOB   A/P:  Remdesivir 200 mg IVPB once followed by 100 mg IVPB daily x 4 days.   Sherilyn Banker, PharmD Pharmacy Resident  03/17/2020 6:13 PM

## 2020-03-17 NOTE — H&P (Signed)
History and Physical    Christine Burnett GYB:[id number] DOB: 26-Jul-1940 DOA: 03/17/2020  PCP: Delsa Grana, PA-C   Patient coming from: Home  I have personally briefly reviewed patient's old medical records in [place]  Chief Complaint: Shortness of breath, Covid positive  HPI: Christine Burnett is a 79 y.o. female with medical history significant for HTN, HLD, anxiety, CKD 3B, diagnosed with COVID-19 on 10/8 presents to the emergency room with worsening shortness of breath since her diagnosis, not improving with albuterol inhaler at home plus she reports development of chills though without fever, chest pain.  She is unvaccinated against Covid.  She endorses generalized malaise, fatigue and poor appetite.  Denies abdominal pain, nausea or vomiting, diarrhea or abdominal pain ED Course: On arrival she was tachypneic at 40 but with otherwise normal vitals, temp 98.2, O2 sat 97% on room air but with desaturations to 89-90 with attempts at ambulation.  BP 119/93, pulse 93.  Blood work significant for elevated D-dimer of 2779, procalcitonin less than 10.  Mild neutropenia of 3.8.  Chest x-ray: Rounded masslike opacity in the right infrahilar lung could reflect acute pneumonia in the setting of COVID-19.  Recommendation for short-term follow-up imaging in 6 weeks to ensure resolution.  Hospitalist consulted for admission.  Review of Systems: As per HPI otherwise all other systems on review of systems negative.    Past Medical History:  Diagnosis Date  . Allergic rhinitis   . Anxiety   . Atrial fibrillation ([place]) 08/09/2017  . Chronic bronchitis ([place])   . Chronic kidney disease, stage III (moderate) ([place]) 08/31/2017   Seeing nephrologist  . CKD (chronic kidney disease), stage III ([place])   . Cornea scar   . Depression    unspecified  . Diverticulosis   . Hx of fracture of left hip 12/24/2017  . Hyperlipidemia   . 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 Hypertension   . Osteoporosis   . Risk for falls   . Situational disturbance   . TIA (transient ischemic attack)     Past Surgical History:  Procedure Laterality Date  . COLONOSCOPY  2016  . ESOPHAGUS SURGERY    . INTRAMEDULLARY (IM) NAIL INTERTROCHANTERIC Left 03/26/2017   Procedure: INTRAMEDULLARY (IM) NAIL INTERTROCHANTRIC;  Surgeon: Hessie Knows, MD;  Location: [facility] ORS;  Service: Orthopedics;  Laterality: Left;     reports that she quit smoking about 2 years ago. Her smoking use included cigarettes. She has a 15.25 pack-year smoking history. She has never used smokeless tobacco. She reports that she does not drink alcohol and does not use drugs.  Allergies  Allergen Reactions  . Cefuroxime Axetil     caused rash.  . Ciprofloxacin     Unknown  . Codeine     Family History  Problem Relation Age of Onset  . Other Mother        [place] posioning  . Heart attack Father   . Prostate cancer Father   . Heart attack Brother   . Diabetes Brother   . Hypertension Daughter   . Kidney cancer Neg Hx   . Bladder Cancer Neg Hx   . Colon cancer Neg Hx   . Colon polyps Neg Hx   . Rectal cancer Neg Hx       Prior to Admission medications   Medication Sig Start Date End Date Taking? Authorizing Provider  albuterol (VENTOLIN HFA) 108 (90 Base) MCG/ACT inhaler Inhale 2 puffs into  the lungs every 6 (six) hours as needed for wheezing or shortness of breath. 03/16/20  Yes Delsa Grana, PA-C  aspirin 81 MG EC tablet Take 81 mg by mouth daily. Swallow whole.   Yes [provider]  atorvastatin (LIPITOR) 10 MG tablet Take 1 tablet (10 mg total) by mouth at bedtime. 01/30/20  Yes Delsa Grana, PA-C  Cholecalciferol (VITAMIN D-3) 1000 units CAPS Take 2 capsules by mouth daily. Takes occassionally 08/09/17  Yes Lada, Satira Anis, MD  hydrOXYzine (ATARAX/VISTARIL) 10 MG tablet Take 1 tablet (10 mg total) by mouth every 8 (eight) hours as needed for anxiety. 01/29/20  Yes  Delsa Grana, PA-C  melatonin 5 MG TABS Take 5 mg by mouth at bedtime. Pt taking 2 tabs qhs   Yes [provider]  PARoxetine (PAXIL) 40 MG tablet Take 1 tablet (40 mg total) by mouth every morning. 01/29/20  Yes Delsa Grana, PA-C  Probiotic Product (PROBIOTIC-10 PO) Take by mouth.   Yes [provider]  acetaminophen (TYLENOL) 650 MG CR tablet Take 1 tablet (650 mg total) by mouth 4 (four) times daily as needed for pain. 01/30/20   Delsa Grana, PA-C  Cyanocobalamin (VITAMIN B-12) 500 MCG SUBL One tablet dissolved under the tongue once a day Patient not taking: Reported on 03/17/2020 05/13/18   Arnetha Courser, MD  losartan (COZAAR) 25 MG tablet Take 1 tablet (25 mg total) by mouth in the morning. Patient not taking: Reported on 03/10/2020 01/30/20   Delsa Grana, PA-C    Physical Exam: Vitals:   03/17/20 1530 03/17/20 1531 03/17/20 1730 03/17/20 1800  BP: (!) 119/93  97/78 95/77  Pulse: 93  86 85  Resp: (!) 40  20   Temp: 98.2 F (36.8 C)     TempSrc: Oral     SpO2: 97%  97% 100%  Weight:  68 kg    Height:  5\' 7"  (1.702 m)       Vitals:   03/17/20 1530 03/17/20 1531 03/17/20 1730 03/17/20 1800  BP: (!) 119/93  97/78 95/77  Pulse: 93  86 85  Resp: (!) 40  20   Temp: 98.2 F (36.8 C)     TempSrc: Oral     SpO2: 97%  97% 100%  Weight:  68 kg    Height:  5\' 7"  (1.702 m)        Constitutional:  Appears weak, somewhat ill and oriented x 3 .  Mild conversational dyspnea HEENT:      Head: Normocephalic and atraumatic.         Eyes: PERLA, EOMI, Conjunctivae are normal. Sclera is non-icteric.       Mouth/Throat:  Deferred in the setting of Covid      Neck: Supple with no signs of meningismus. Cardiovascular: Regular rate and rhythm. No murmurs, gallops, or rubs. 2+ symmetrical distal pulses are present . No JVD. No LE edema Respiratory: Respiratory effort increased.coarse breath sounds bilaterally Gastrointestinal: Soft, non tender, and non distended with  positive bowel sounds. No rebound or guarding. Genitourinary: No CVA tenderness. Musculoskeletal: Nontender with normal range of motion in all extremities. No cyanosis, or erythema of extremities. Neurologic:  Face is symmetric. Moving all extremities. No gross focal neurologic deficits . Skin: Skin is warm, dry.  No rash or ulcers Psychiatric: Mood and affect are normal    Labs on Admission: I have personally reviewed following labs and imaging studies  CBC: Recent Labs  Lab 03/17/20 1553  WBC 3.8*  NEUTROABS  2.5  HGB 13.0  HCT 37.6  MCV 96.2  PLT 397   Basic Metabolic Panel: Recent Labs  Lab 03/17/20 1553  NA 136  K 4.1  CL 103  CO2 24  GLUCOSE 116*  BUN 22  CREATININE 1.67*  CALCIUM 9.1   GFR: Estimated Creatinine Clearance: 26.1 mL/min (A) (by C-G formula based on SCr of 1.67 mg/dL (H)). Liver Function Tests: Recent Labs  Lab 03/17/20 1553  AST 29  ALT 17  ALKPHOS 75  BILITOT 0.7  PROT 7.8  ALBUMIN 3.9   No results for input(s): LIPASE, AMYLASE in the last 168 hours. No results for input(s): AMMONIA in the last 168 hours. Coagulation Profile: Recent Labs  Lab 03/17/20 1553  INR 0.9   Cardiac Enzymes: No results for input(s): CKTOTAL, CKMB, CKMBINDEX, TROPONINI in the last 168 hours. BNP (last 3 results) No results for input(s): PROBNP in the last 8760 hours. HbA1C: No results for input(s): HGBA1C in the last 72 hours. CBG: No results for input(s): GLUCAP in the last 168 hours. Lipid Profile: No results for input(s): CHOL, HDL, LDLCALC, TRIG, CHOLHDL, LDLDIRECT in the last 72 hours. Thyroid Function Tests: No results for input(s): TSH, T4TOTAL, FREET4, T3FREE, THYROIDAB in the last 72 hours. Anemia Panel: No results for input(s): VITAMINB12, FOLATE, FERRITIN, TIBC, IRON, RETICCTPCT in the last 72 hours. Urine analysis:    Component Value Date/Time   COLORURINE YELLOW 02/23/2017 1426   APPEARANCEUR CLEAR 02/23/2017 1426   APPEARANCEUR Clear  01/28/2016 0841   LABSPEC 1.015 02/23/2017 1426   PHURINE 5.5 02/23/2017 1426   GLUCOSEU NEGATIVE 02/23/2017 1426   HGBUR SMALL (A) 02/23/2017 1426   BILIRUBINUR neg 07/29/2019 1541   BILIRUBINUR Negative 01/28/2016 0841   KETONESUR NEGATIVE 02/23/2017 1426   PROTEINUR Negative 07/29/2019 1541   PROTEINUR NEGATIVE 02/23/2017 1426   UROBILINOGEN 0.2 07/29/2019 1541   NITRITE neg 07/29/2019 1541   NITRITE NEGATIVE 02/23/2017 1426   LEUKOCYTESUR Negative 07/29/2019 1541   LEUKOCYTESUR Trace (A) 01/28/2016 0841    Radiological Exams on Admission: DG Chest 2 View  Result Date: 03/17/2020 CLINICAL DATA:  COVID-19 positive 6 days prior EXAM: CHEST - 2 VIEW COMPARISON:  Radiograph 03/25/2017 FINDINGS: Rounded masslike opacity present in the right infrahilar lung. Additional patchy and streaky opacities are present in the left infrahilar lung as well. No pneumothorax or effusion. Chronic hyperinflation with emphysematous features. The aorta is calcified. The remaining cardiomediastinal contours are unremarkable. No acute osseous or soft tissue abnormality. Degenerative changes are present in the imaged spine and shoulders. IMPRESSION: Rounded masslike opacity in the right infrahilar lung, could reflect an acute pneumonia in the setting of COVID-19 given some additional streaky and ill-defined opacities elsewhere. However, this finding warrants at minimum short-term (6 week) follow-up imaging after therapeutic intervention to ensure resolution, otherwise cross-sectional imaging should be obtained. Electronically Signed   By: Lovena Le M.D.   On: 03/17/2020 16:26     Assessment/Plan 79 year old female with history of HTN, HLD, anxiety, CKD 3B, diagnosed with COVID-19 on 10/8 presenting with worsening shortness of breath, malaise, fatigue and decreased appetite    Pneumonia due to COVID-19 virus   Hypoxia -Diagnosed with COVID-19 on 10/8 presenting with worsening shortness of breath, malaise and  poor appetite with hypoxia 89-90 on room air -Elevated D-dimer over 2000 -Remdesivir, albuterol, antitussives, multivitamins.  No steroids for now unless more hypoxic -Supplemental oxygen and proning as tolerated -Follow inflammatory biomarkers    HTN (hypertension) -Patient does not take her  home losartan due to dizziness so we will continue to hold as she is normotensive    Anxiety -Continue home paroxetine and Vistaril    Hyperlipidemia -Continue home atorvastatin    Chronic kidney disease, stage 3b ([place]) -Renal function at baseline with creatinine of 1.67    DVT prophylaxis: Lovenox  Code Status: full code  Family Communication:  none  Disposition Plan: Back to previous home environment Consults called: none  Status:At the time of admission, it appears that the appropriate admission status for this patient is INPATIENT. This is judged to be reasonable and necessary in order to provide the required intensity of service to ensure the patient's safety given the presenting symptoms, physical exam findings, and initial radiographic and laboratory data in the context of their  Comorbid conditions.   Patient requires inpatient status due to high intensity of service, high risk for further deterioration and high frequency of surveillance required.   I certify that at the point of admission it is my clinical judgment that the patient will require inpatient hospital care spanning beyond [place] MD [facility]     03/17/2020, 7:31 PM

## 2020-03-17 NOTE — Telephone Encounter (Signed)
Pt.notified

## 2020-03-17 NOTE — ED Triage Notes (Signed)
Pt c/o shortness of breath and chills. Pt tested positive for Covid 19 x 6 days ago. Pt states she has been home since test but today became more short of breath and is having chills and shaking.

## 2020-03-18 DIAGNOSIS — F419 Anxiety disorder, unspecified: Secondary | ICD-10-CM | POA: Diagnosis not present

## 2020-03-18 DIAGNOSIS — J1282 Pneumonia due to coronavirus disease 2019: Secondary | ICD-10-CM

## 2020-03-18 DIAGNOSIS — U071 COVID-19: Principal | ICD-10-CM

## 2020-03-18 DIAGNOSIS — E782 Mixed hyperlipidemia: Secondary | ICD-10-CM

## 2020-03-18 DIAGNOSIS — N1832 Chronic kidney disease, stage 3b: Secondary | ICD-10-CM | POA: Diagnosis not present

## 2020-03-18 DIAGNOSIS — I10 Essential (primary) hypertension: Secondary | ICD-10-CM

## 2020-03-18 DIAGNOSIS — R0902 Hypoxemia: Secondary | ICD-10-CM

## 2020-03-18 LAB — CBC WITH DIFFERENTIAL/PLATELET
Abs Immature Granulocytes: 0.02 10*3/uL (ref 0.00–0.07)
Basophils Absolute: 0 10*3/uL (ref 0.0–0.1)
Basophils Relative: 1 %
Eosinophils Absolute: 0 10*3/uL (ref 0.0–0.5)
Eosinophils Relative: 0 %
HCT: 35.1 % — ABNORMAL LOW (ref 36.0–46.0)
Hemoglobin: 12 g/dL (ref 12.0–15.0)
Immature Granulocytes: 1 %
Lymphocytes Relative: 30 %
Lymphs Abs: 0.5 10*3/uL — ABNORMAL LOW (ref 0.7–4.0)
MCH: 33 pg (ref 26.0–34.0)
MCHC: 34.2 g/dL (ref 30.0–36.0)
MCV: 96.4 fL (ref 80.0–100.0)
Monocytes Absolute: 0.1 10*3/uL (ref 0.1–1.0)
Monocytes Relative: 6 %
Neutro Abs: 1.1 10*3/uL — ABNORMAL LOW (ref 1.7–7.7)
Neutrophils Relative %: 62 %
Platelets: 147 10*3/uL — ABNORMAL LOW (ref 150–400)
RBC: 3.64 MIL/uL — ABNORMAL LOW (ref 3.87–5.11)
RDW: 13.2 % (ref 11.5–15.5)
WBC: 1.8 10*3/uL — ABNORMAL LOW (ref 4.0–10.5)
nRBC: 0 % (ref 0.0–0.2)

## 2020-03-18 LAB — COMPREHENSIVE METABOLIC PANEL
ALT: 16 U/L (ref 0–44)
AST: 27 U/L (ref 15–41)
Albumin: 3.3 g/dL — ABNORMAL LOW (ref 3.5–5.0)
Alkaline Phosphatase: 65 U/L (ref 38–126)
Anion gap: 8 (ref 5–15)
BUN: 22 mg/dL (ref 8–23)
CO2: 23 mmol/L (ref 22–32)
Calcium: 8.5 mg/dL — ABNORMAL LOW (ref 8.9–10.3)
Chloride: 109 mmol/L (ref 98–111)
Creatinine, Ser: 1.49 mg/dL — ABNORMAL HIGH (ref 0.44–1.00)
GFR, Estimated: 33 mL/min — ABNORMAL LOW (ref >60)
Glucose, Bld: 163 mg/dL — ABNORMAL HIGH (ref 70–99)
Potassium: 4.3 mmol/L (ref 3.5–5.1)
Sodium: 140 mmol/L (ref 135–145)
Total Bilirubin: 0.5 mg/dL (ref 0.3–1.2)
Total Protein: 7 g/dL (ref 6.5–8.1)

## 2020-03-18 LAB — FIBRIN DERIVATIVES D-DIMER (ARMC ONLY): Fibrin derivatives D-dimer (ARMC): 2317.32 ng/mL (FEU) — ABNORMAL HIGH (ref 0.00–499.00)

## 2020-03-18 LAB — C-REACTIVE PROTEIN: CRP: 3.5 mg/dL — ABNORMAL HIGH (ref <1.0)

## 2020-03-18 LAB — FERRITIN: Ferritin: 216 ng/mL (ref 11–307)

## 2020-03-18 MED ORDER — SODIUM CHLORIDE 0.9 % IV SOLN
INTRAVENOUS | Status: DC | PRN
  Administered 2020-03-18: 250 mL via INTRAVENOUS

## 2020-03-18 NOTE — Progress Notes (Signed)
PROGRESS NOTE  Christine Burnett  UXN:[id number] DOB: 10-25-1940 DOA: 03/17/2020 PCP: Delsa Grana, PA-C   Brief Narrative: Christine Burnett is an 79 y.o. female with a history of HTN, HLD, stage IIIb CKD, anxiety, and covid-19 diagnosed 10/8 who presented to the ED 10/13 with progressive shortness of breath, fatigue, generalized weakness. In the ED she was not hypoxemic but was tachypneic. CRP 1, d-dimer 2,779, PCT negative, and CXR demonstrated rounded masslike right infrahilar opacity felt to represent pneumonia. Steroids and remdesivir were given and the patient admitted.   Assessment & Plan: Principal Problem:   Pneumonia due to COVID-19 virus Active Problems:   HTN (hypertension)   Anxiety   Hyperlipidemia   Chronic kidney disease, stage 3b ([place])   Hypoxia  Covid-19 pneumonia: SARS-CoV-2 positive on 10/8. CRP only 1, CXR with right infrahilar mass. D-dimer ~2k. PCT negative. Got covid from family who lives with her.  - Continue remdesivir x5 days (10/13 - 10/17). Patient may have difficulty securing transportation to infusion center after discharge. She will work to arrange this in the event she is stable for discharge 10/15. - No steroids at this time (given initial dose in ED but currently confirmed not to be hypoxemic).  - Encourage OOB, IS, FV, and awake proning if able - Continue airborne, contact precautions for 21 days from positive testing. - Monitor CMP and inflammatory markers - Enoxaparin prophylactic dose.  - Change to albuterol prn.   Stage IIIb CKD: CrCl near baseline.  - Avoid CTA chest if possible. Since not hypoxemic, tachycardic, will defer this for now.   Infrahilar pulmonary masslike opacity:  - NEEDS REPEAT CXR IN 6 WEEKS TO CONFIRM RESOLUTION OF RIGHT SIDED PULMONARY OPACITY. This was discussed with the patient.  HLD:  - Continue statin  Depression, anxiety:  - Continue paxil, prn hydroxyzine.   HTN: Pt not on losartan due to dizziness. Currently  normotensive.  DVT prophylaxis: Lovenox 30mg  q24h Code Status: Full Family Communication: None at bedside, did not request call Disposition Plan:  Status is: Inpatient  Remains inpatient appropriate because:Inpatient level of care appropriate due to severity of illness  Dispo: The patient is from: Home              Anticipated d/c is to: Home              Anticipated d/c date is: 1 day              Patient currently is not medically stable to d/c.  Consultants:   None  Procedures:   None  Antimicrobials:  Remdesivir   Subjective: Weakness much better than at admission. No fever. Feels much better, denies shortness of breath at rest. However, when working with therapy this morning, could not go more than 40 feet due to weakness/dyspnea. Her baseline is unrestricted by dyspnea. No chest pain or leg swelling or hx DVT.  Objective: Vitals:   03/17/20 1930 03/18/20 0432 03/18/20 0743 03/18/20 1136  BP: 105/83 120/65 (!) 145/116 120/89  Pulse: 92 90 78 80  Resp:  18 18 20   Temp:  98.8 F (37.1 C) 98.5 F (36.9 C) 98.2 F (36.8 C)  TempSrc:  Oral Oral Oral  SpO2: 95% 93% 92% 99%  Weight:      Height:       No intake or output data in the 24 hours ending 03/18/20 1244 Filed Weights   03/17/20 1531  Weight: 68 kg    Gen: 79 y.o. female in  no distress Pulm: Non-labored breathing room air. Clear to auscultation bilaterally.  CV: Regular rate and rhythm. No murmur, rub, or gallop. No JVD, no pedal edema. GI: Abdomen soft, non-tender, non-distended, with normoactive bowel sounds. No organomegaly or masses felt. Ext: Warm, no deformities Skin: No rashes, lesions or ulcers Neuro: Alert and oriented. No focal neurological deficits. Psych: Judgement and insight appear normal. Mood & affect appropriate.   Data Reviewed: I have personally reviewed following labs and imaging studies  CBC: Recent Labs  Lab 03/17/20 1553 03/18/20 0543  WBC 3.8* 1.8*  NEUTROABS 2.5 1.1*    HGB 13.0 12.0  HCT 37.6 35.1*  MCV 96.2 96.4  PLT 152 712*   Basic Metabolic Panel: Recent Labs  Lab 03/17/20 1553 03/18/20 0543  NA 136 140  K 4.1 4.3  CL 103 109  CO2 24 23  GLUCOSE 116* 163*  BUN 22 22  CREATININE 1.67* 1.49*  CALCIUM 9.1 8.5*   GFR: Estimated Creatinine Clearance: 29.3 mL/min (A) (by C-G formula based on SCr of 1.49 mg/dL (H)). Liver Function Tests: Recent Labs  Lab 03/17/20 1553 03/18/20 0543  AST 29 27  ALT 17 16  ALKPHOS 75 65  BILITOT 0.7 0.5  PROT 7.8 7.0  ALBUMIN 3.9 3.3*   No results for input(s): LIPASE, AMYLASE in the last 168 hours. No results for input(s): AMMONIA in the last 168 hours. Coagulation Profile: Recent Labs  Lab 03/17/20 1553  INR 0.9   Cardiac Enzymes: No results for input(s): CKTOTAL, CKMB, CKMBINDEX, TROPONINI in the last 168 hours. BNP (last 3 results) No results for input(s): PROBNP in the last 8760 hours. HbA1C: No results for input(s): HGBA1C in the last 72 hours. CBG: No results for input(s): GLUCAP in the last 168 hours. Lipid Profile: No results for input(s): CHOL, HDL, LDLCALC, TRIG, CHOLHDL, LDLDIRECT in the last 72 hours. Thyroid Function Tests: No results for input(s): TSH, T4TOTAL, FREET4, T3FREE, THYROIDAB in the last 72 hours. Anemia Panel: Recent Labs    03/18/20 0543  FERRITIN 216   Urine analysis:    Component Value Date/Time   COLORURINE YELLOW 02/23/2017 1426   APPEARANCEUR CLEAR 02/23/2017 1426   APPEARANCEUR Clear 01/28/2016 0841   LABSPEC 1.015 02/23/2017 1426   PHURINE 5.5 02/23/2017 1426   GLUCOSEU NEGATIVE 02/23/2017 1426   HGBUR SMALL (A) 02/23/2017 1426   BILIRUBINUR neg 07/29/2019 1541   BILIRUBINUR Negative 01/28/2016 0841   KETONESUR NEGATIVE 02/23/2017 1426   PROTEINUR Negative 07/29/2019 1541   PROTEINUR NEGATIVE 02/23/2017 1426   UROBILINOGEN 0.2 07/29/2019 1541   NITRITE neg 07/29/2019 1541   NITRITE NEGATIVE 02/23/2017 1426   LEUKOCYTESUR Negative 07/29/2019  1541   LEUKOCYTESUR Trace (A) 01/28/2016 0841   Recent Results (from the past 240 hour(s))  Culture, blood (Routine x 2)     Status: None (Preliminary result)   Collection Time: 03/17/20  3:42 PM   Specimen: BLOOD  Result Value Ref Range Status   Specimen Description BLOOD RIGHT ANTECUBITAL  Final   Special Requests   Final    BOTTLES DRAWN AEROBIC AND ANAEROBIC Blood Culture adequate volume   Culture   Final    NO GROWTH < 24 HOURS Performed at [facility], [address] [place], [place] [address]    Report Status PENDING  Incomplete  Respiratory Panel by RT PCR (Flu A&B, Covid) - Nasopharyngeal Swab     Status: Abnormal   Collection Time: 03/17/20  7:56 PM   Specimen: Nasopharyngeal Swab  Result Value Ref Range Status   SARS Coronavirus 2 by RT PCR POSITIVE (A) NEGATIVE Final    Comment: RESULT CALLED TO, READ BACK BY AND VERIFIED WITH: [facility] REGISTER RN [id number] 03/17/20 HNM (NOTE) SARS-CoV-2 target nucleic acids are DETECTED.  SARS-CoV-2 RNA is generally detectable in upper respiratory specimens  during the acute phase of infection. Positive results are indicative of the presence of the identified virus, but do not rule out bacterial infection or co-infection with other pathogens not detected by the test. Clinical correlation with patient history and other diagnostic information is necessary to determine patient infection status. The expected result is Negative.  Fact Sheet for Patients:  PinkCheek.be  Fact Sheet for Healthcare Providers: GravelBags.it  This test is not yet approved or cleared by the [place] FDA and  has been authorized for detection and/or diagnosis of SARS-CoV-2 by FDA under an Emergency Use Authorization (EUA).  This EUA will remain in effect (meaning this test can be  used) for the duration of  the COVID-19 declaration under Section 564(b)(1) of the Act, 21 U.S.C. section  360bbb-3(b)(1), unless the authorization is terminated or revoked sooner.      Influenza A by PCR NEGATIVE NEGATIVE Final   Influenza B by PCR NEGATIVE NEGATIVE Final    Comment: (NOTE) The Xpert Xpress SARS-CoV-2/FLU/RSV assay is intended as an aid in  the diagnosis of influenza from Nasopharyngeal swab specimens and  should not be used as a sole basis for treatment. Nasal washings and  aspirates are unacceptable for Xpert Xpress SARS-CoV-2/FLU/RSV  testing.  Fact Sheet for Patients: PinkCheek.be  Fact Sheet for Healthcare Providers: GravelBags.it  This test is not yet approved or cleared by the [place] FDA and  has been authorized for detection and/or diagnosis of SARS-CoV-2 by  FDA under an Emergency Use Authorization (EUA). This EUA will remain  in effect (meaning this test can be used) for the duration of the  Covid-19 declaration under Section 564(b)(1) of the Act, 21  U.S.C. section 360bbb-3(b)(1), unless the authorization is  terminated or revoked. Performed at [facility], [address], [place], [place] [address]       Radiology Studies: DG Chest 2 View  Result Date: 03/17/2020 CLINICAL DATA:  COVID-19 positive 6 days prior EXAM: CHEST - 2 VIEW COMPARISON:  Radiograph 03/25/2017 FINDINGS: Rounded masslike opacity present in the right infrahilar lung. Additional patchy and streaky opacities are present in the left infrahilar lung as well. No pneumothorax or effusion. Chronic hyperinflation with emphysematous features. The aorta is calcified. The remaining cardiomediastinal contours are unremarkable. No acute osseous or soft tissue abnormality. Degenerative changes are present in the imaged spine and shoulders. IMPRESSION: Rounded masslike opacity in the right infrahilar lung, could reflect an acute pneumonia in the setting of COVID-19 given some additional streaky and ill-defined opacities  elsewhere. However, this finding warrants at minimum short-term (6 week) follow-up imaging after therapeutic intervention to ensure resolution, otherwise cross-sectional imaging should be obtained. Electronically Signed   By: Lovena Le M.D.   On: 03/17/2020 16:26    Scheduled Meds: . albuterol  2 puff Inhalation Q6H  . vitamin C  500 mg Oral Daily  . aspirin EC  81 mg Oral Daily  . atorvastatin  10 mg Oral QHS  . enoxaparin (LOVENOX) injection  30 mg Subcutaneous Q24H  . multivitamin with minerals  1 tablet Oral Daily  . PARoxetine  40 mg Oral [id number]  . zinc sulfate  220 mg Oral Daily  Continuous Infusions: . sodium chloride 250 mL (03/18/20 0846)  . remdesivir 100 mg in NS 100 mL 100 mg (03/18/20 0848)     LOS: 1 day   Time spent: 25 minutes.  Patrecia Pour, MD [facility] [email or web address] 03/18/2020, 12:44 PM

## 2020-03-18 NOTE — Evaluation (Addendum)
Occupational Therapy Evaluation Patient Details Name: Christine Burnett MRN: [id number] DOB: February 14, 1941 Today's Date: 03/18/2020    History of Present Illness 79 y.o. female with medical history significant for HTN, HLD, anxiety, CKD 3B, diagnosed with COVID-19 on 10/8 presents to the emergency room with worsening shortness of breath since her diagnosis, not improving with albuterol inhaler at home plus she reports development of chills though without fever, chest pain.  She is unvaccinated against Covid.  She endorses generalized malaise, fatigue and poor appetite.  Denies abdominal pain, nausea or vomiting, diarrhea or abdominal pain   Clinical Impression   Patient presenting with decreased I in self care, balance, functional mobility/transfers, endurance, and safety awareness. Patient reports living with daughter and 2 grandchildren PTA. She is home alone during the day and utilized a cane for functional mobility at home. Pt does report having increased trouble getting up and down from low commode at home. Pt on RA throughout session and HR increased to 110 bpm and O2 saturation remaining at or above 96% with ambulating 40' into hallway with min A and use of RW. Pt reports feeling very weak and returning to room for safety. Pt also noted to have B UE tremors but pt recently received medications likely to cause this. RN notified.  Patient currently functioning at supervision/set up A for seated ADL tasks and min A overall for standing ADLs secondary to standing balance. Pt reports her daughter is also at home with covid. Patient will benefit from acute OT to increase overall independence in the areas of ADLs, functional mobility, and safety awareness in order to safely discharge home with caregiver.    Follow Up Recommendations  Home health OT;Supervision - Intermittent    Equipment Recommendations  3 in 1 bedside commode    Recommendations for Other Services Other (comment) (none at this  time)     Precautions / Restrictions Precautions Precautions: Fall Restrictions Weight Bearing Restrictions: No      Mobility Bed Mobility Overal bed mobility: Needs Assistance Bed Mobility: Supine to Sit;Sit to Supine     Supine to sit: Supervision Sit to supine: Supervision   General bed mobility comments: min cuing for technique and hand placemnt with HOB elevated  Transfers   Equipment used: Quad cane     General transfer comment: Min A overall    Balance Overall balance assessment: Needs assistance Sitting-balance support: Feet supported Sitting balance-Leahy Scale: Good Sitting balance - Comments: no LOB   Standing balance support: During functional activity Standing balance-Leahy Scale: Fair Standing balance comment: min A for standing balance        ADL either performed or assessed with clinical judgement   ADL Overall ADL's : Needs assistance/impaired Eating/Feeding: Independent   Grooming: Wash/dry hands;Wash/dry face;Oral care;Standing;Min guard    Toilet Transfer: Minimal [employer], [employer] Details (indicate cue type and reason): with cane Toileting- Clothing Manipulation and Hygiene: Minimal assistance;Sit to/from stand       Functional mobility during ADLs: Cane;Minimal assistance General ADL Comments: Pt appears very anxious with mobility and feels "shaky" from recent medications. Pt performs seated ADL tasks with set up A and standing tasks with min A for standing balance. Min A for functional transfers with use of cane.     Vision Baseline Vision/History: No visual deficits Patient Visual Report: No change from baseline              Pertinent Vitals/Pain Pain Assessment: No/denies pain     Hand Dominance Right  Extremity/Trunk Assessment Upper Extremity Assessment Upper Extremity Assessment: Generalized weakness   Lower Extremity Assessment Lower Extremity Assessment: Generalized weakness        Communication Communication Communication: No difficulties   Cognition Arousal/Alertness: Awake/alert Behavior During Therapy: WFL for tasks assessed/performed Overall Cognitive Status: Within Functional Limits for tasks assessed                       Home Living Family/patient expects to be discharged to:: Private residence Living Arrangements: Children Available Help at Discharge: Family;Available PRN/intermittently Type of Home: House Home Access: Stairs to enter [employer] of Steps: 3 STE Entrance Stairs-Rails: Left Home Layout: One level     Bathroom Shower/Tub: Tub/shower unit;Walk-in shower   Bathroom Toilet: Standard Bathroom Accessibility: Yes   Home Equipment: Grab bars - tub/shower;Cane - quad          Prior Functioning/Environment Level of Independence: Independent with assistive device(s)                 OT Problem List: Decreased strength;Decreased safety awareness;Decreased activity tolerance;Impaired balance (sitting and/or standing);Decreased knowledge of use of DME or AE      OT Treatment/Interventions: Self-care/ADL training;Therapeutic exercise;Therapeutic activities;Energy conservation;Patient/family education;DME and/or AE instruction;Balance training    OT Goals(Current goals can be found in the care plan section) Acute Rehab OT Goals Patient Stated Goal: to go home OT Goal Formulation: With patient Time For Goal Achievement: 04/01/20 Potential to Achieve Goals: Good ADL Goals Pt Will Perform Grooming: with modified independence;standing Pt Will Perform Lower Body Dressing: with modified independence;sit to/from stand Pt Will Transfer to Toilet: with modified independence;ambulating Pt Will Perform Toileting - Clothing Manipulation and hygiene: with modified independence;sit to/from stand  OT Frequency: Min 1X/week   Barriers to D/C: Other (comment)  none known at this time          AM-PAC OT "6 Clicks" Daily  Activity     Outcome Measure Help from another person eating meals?: None Help from another person taking care of personal grooming?: None Help from another person toileting, which includes using toliet, bedpan, or urinal?: A Little Help from another person bathing (including washing, rinsing, drying)?: A Little Help from another person to put on and taking off regular upper body clothing?: A Little Help from another person to put on and taking off regular lower body clothing?: A Little 6 Click Score: 20   End of Session Equipment Utilized During Treatment: Other (comment) (cane) Nurse Communication: Mobility status  Activity Tolerance: Patient tolerated treatment well Patient left: in bed;with call bell/phone within reach  OT Visit Diagnosis: Unsteadiness on feet (R26.81);Muscle weakness (generalized) (M62.81)                Time: 2876-8115 OT Time Calculation (min): 23 min Charges:  OT General Charges $OT Visit: 1 Visit OT Evaluation $OT Eval Low Complexity: 1 Low OT Treatments $Self Care/Home Management : 8-22 mins  Darleen Crocker, MS, OTR/L , CBIS ascom [phone]  03/18/20, 11:05 AM

## 2020-03-18 NOTE — Evaluation (Signed)
Physical Therapy Evaluation Patient Details Name: Christine Burnett MRN: [id number] DOB: [birth year] Today's Date: 03/18/2020   History of Present Illness  79 y.o. female with medical history significant for HTN, HLD, anxiety, CKD 3B, diagnosed with COVID-19 on 10/8 presents to the emergency room with worsening shortness of breath since her diagnosis, not improving with albuterol inhaler at home plus she reports development of chills though without fever, chest pain.  She is unvaccinated against Covid.  She endorses generalized malaise, fatigue and poor appetite.  Denies abdominal pain, nausea or vomiting, diarrhea or abdominal pain  Clinical Impression  Upon evaluation, patient alert and oriented; follows commands and demonstrates good effort with functional tasks.  Endorses feeling "much better" since admission with regards to strength and respiratory status.  Bilat UE/LE strength and ROM grossly symmetrical and WFL; no focal weakness appreciated  Able to complete bed mobility with mod indep; sit/stand, basic transfers and gait (100' x2) with SPC, cga/close sup.  Demonstrates  short, choppy steps with decreased heel strike/toe off bilat; limited balance reactions, relies on LE step strategy for balance recovery.  Maintains SPC in hand "just in case", but tends to carry in the air to "see if I can go without it".  Do recommend continued use of SPC for optimal safety/indep with functional activities and overall energy conservation. Of note, sats >89-90% on RA at rest and with exertion throughout session. Would benefit from skilled PT to address above deficits and promote optimal return to PLOF.; Recommend transition to HHPT upon discharge from acute hospitalization.     Follow Up Recommendations Home health PT    Equipment Recommendations       Recommendations for Other Services       Precautions / Restrictions Precautions Precautions: Fall Restrictions Weight Bearing Restrictions: No       Mobility  Bed Mobility Overal bed mobility: Needs Assistance       Supine to sit: Modified independent (Device/Increase time)        Transfers Overall transfer level: Needs assistance Equipment used: Straight cane Transfers: Sit to/from Stand Sit to Stand: Supervision            Ambulation/Gait Ambulation/Gait assistance: Supervision;Min guard Gait Distance (Feet):  (100 x2) Assistive device: Straight cane       General Gait Details: short, choppy steps with decreased heel strike/toe off bilat; limited balance reactions, relies on LE step strategy for balance recovery.  Maintains SPC in hand "just in case", but tends to carry in the air  Stairs            Wheelchair Mobility    Modified Rankin (Stroke Patients Only)       Balance Overall balance assessment: Needs assistance Sitting-balance support: No upper extremity supported;Feet supported Sitting balance-Leahy Scale: Good     Standing balance support: No upper extremity supported Standing balance-Leahy Scale: Fair                               Pertinent Vitals/Pain Pain Assessment: No/denies pain    Home Living Family/patient expects to be discharged to:: Private residence Living Arrangements: Children Available Help at Discharge: Family;Available PRN/intermittently Type of Home: House Home Access: Stairs to enter Entrance Stairs-Rails: Left Entrance Stairs-Number of Steps: 3 STE Home Layout: One level        Prior Function Level of Independence: Independent with assistive device(s)         Comments: Indep for ADLs, household  and community mobilizaiton without assist device; recent use of SPC due to weakness since diagnosis.  No home O2, no recent fall history.     Hand Dominance   Dominant Hand: Right    Extremity/Trunk Assessment   Upper Extremity Assessment Upper Extremity Assessment: Generalized weakness    Lower Extremity Assessment Lower Extremity  Assessment: Generalized weakness (grossly at least 4-/5 throughout; no focal weakness)       Communication   Communication: No difficulties  Cognition Arousal/Alertness: Awake/alert Behavior During Therapy: WFL for tasks assessed/performed Overall Cognitive Status: Within Functional Limits for tasks assessed                                        General Comments      Exercises Other Exercises Other Exercises: Reviewed strategies for pursed lip breathing, activity pacing to optimize respiratory support/control with functional activities; patient voiced understanding.   Assessment/Plan    PT Assessment Patient needs continued PT services  PT Problem List Decreased strength;Decreased range of motion;Decreased activity tolerance;Decreased balance;Decreased mobility;Decreased knowledge of use of DME;Decreased safety awareness       PT Treatment Interventions DME instruction;Gait training;Stair training;Functional mobility training;Therapeutic activities;Therapeutic exercise;Balance training;Patient/family education    PT Goals (Current goals can be found in the Care Plan section)  Acute Rehab PT Goals Patient Stated Goal: to go home PT Goal Formulation: With patient Time For Goal Achievement: 04/01/20 Potential to Achieve Goals: Good    Frequency Min 2X/week   Barriers to discharge        Co-evaluation               AM-PAC PT "6 Clicks" Mobility  Outcome Measure Help needed turning from your back to your side while in a flat bed without using bedrails?: None Help needed moving from lying on your back to sitting on the side of a flat bed without using bedrails?: None Help needed moving to and from a bed to a chair (including a wheelchair)?: None Help needed standing up from a chair using your arms (e.g., wheelchair or bedside chair)?: None Help needed to walk in hospital room?: A Little Help needed climbing 3-5 steps with a railing? : A Little 6  Click Score: 22    End of Session Equipment Utilized During Treatment: (P) Gait belt Activity Tolerance: Patient tolerated treatment well Patient left:  (seated edge of bed, needs in reach; alarm not required per fall risk score) Nurse Communication: Mobility status PT Visit Diagnosis: Muscle weakness (generalized) (M62.81);Difficulty in walking, not elsewhere classified (R26.2)    Time: 0923-3007 PT Time Calculation (min) (ACUTE ONLY): 17 min   Charges:   PT Evaluation $PT Eval Moderate Complexity: 1 Mod PT Treatments $Gait Training: 8-22 mins        Leaira Fullam H. Owens Shark, PT, DPT, NCS 03/18/20, 10:51 PM [phone]

## 2020-03-19 DIAGNOSIS — U071 COVID-19: Secondary | ICD-10-CM | POA: Diagnosis not present

## 2020-03-19 DIAGNOSIS — N1832 Chronic kidney disease, stage 3b: Secondary | ICD-10-CM | POA: Diagnosis not present

## 2020-03-19 DIAGNOSIS — I10 Essential (primary) hypertension: Secondary | ICD-10-CM | POA: Diagnosis not present

## 2020-03-19 DIAGNOSIS — F419 Anxiety disorder, unspecified: Secondary | ICD-10-CM | POA: Diagnosis not present

## 2020-03-19 LAB — COMPREHENSIVE METABOLIC PANEL
ALT: 17 U/L (ref 0–44)
AST: 31 U/L (ref 15–41)
Albumin: 3.2 g/dL — ABNORMAL LOW (ref 3.5–5.0)
Alkaline Phosphatase: 70 U/L (ref 38–126)
Anion gap: 7 (ref 5–15)
BUN: 34 mg/dL — ABNORMAL HIGH (ref 8–23)
CO2: 23 mmol/L (ref 22–32)
Calcium: 8.8 mg/dL — ABNORMAL LOW (ref 8.9–10.3)
Chloride: 107 mmol/L (ref 98–111)
Creatinine, Ser: 1.47 mg/dL — ABNORMAL HIGH (ref 0.44–1.00)
GFR, Estimated: 33 mL/min — ABNORMAL LOW (ref >60)
Glucose, Bld: 94 mg/dL (ref 70–99)
Potassium: 4.2 mmol/L (ref 3.5–5.1)
Sodium: 137 mmol/L (ref 135–145)
Total Bilirubin: 0.6 mg/dL (ref 0.3–1.2)
Total Protein: 6.7 g/dL (ref 6.5–8.1)

## 2020-03-19 LAB — CBC WITH DIFFERENTIAL/PLATELET
Abs Immature Granulocytes: 0.01 10*3/uL (ref 0.00–0.07)
Basophils Absolute: 0 10*3/uL (ref 0.0–0.1)
Basophils Relative: 0 %
Eosinophils Absolute: 0 10*3/uL (ref 0.0–0.5)
Eosinophils Relative: 0 %
HCT: 36.1 % (ref 36.0–46.0)
Hemoglobin: 12.3 g/dL (ref 12.0–15.0)
Immature Granulocytes: 0 %
Lymphocytes Relative: 32 %
Lymphs Abs: 1.9 10*3/uL (ref 0.7–4.0)
MCH: 32.8 pg (ref 26.0–34.0)
MCHC: 34.1 g/dL (ref 30.0–36.0)
MCV: 96.3 fL (ref 80.0–100.0)
Monocytes Absolute: 0.5 10*3/uL (ref 0.1–1.0)
Monocytes Relative: 8 %
Neutro Abs: 3.5 10*3/uL (ref 1.7–7.7)
Neutrophils Relative %: 60 %
Platelets: 168 10*3/uL (ref 150–400)
RBC: 3.75 MIL/uL — ABNORMAL LOW (ref 3.87–5.11)
RDW: 13.5 % (ref 11.5–15.5)
WBC: 5.8 10*3/uL (ref 4.0–10.5)
nRBC: 0 % (ref 0.0–0.2)

## 2020-03-19 LAB — FIBRIN DERIVATIVES D-DIMER (ARMC ONLY): Fibrin derivatives D-dimer (ARMC): 1541.06 ng/mL (FEU) — ABNORMAL HIGH (ref 0.00–499.00)

## 2020-03-19 LAB — C-REACTIVE PROTEIN: CRP: 1 mg/dL — ABNORMAL HIGH (ref <1.0)

## 2020-03-19 MED ORDER — ALBUTEROL SULFATE HFA 108 (90 BASE) MCG/ACT IN AERS
2.0000 | INHALATION_SPRAY | Freq: Four times a day (QID) | RESPIRATORY_TRACT | Status: DC | PRN
  Filled 2020-03-19: qty 6.7

## 2020-03-19 NOTE — Progress Notes (Signed)
Patient scheduled for outpatient Remdesivir infusions at noon on Saturday 10/16 and Sunday 10/17  at [facility]. Please inform the patient to park at [place], as staff will be escorting the patient through the [place] entrance of the hospital. Appointments take approximately 45 minutes.    There is a wave flag banner located near the entrance on [address]. Turn into this entrance and immediately turn left or right and park in 1 of the 10 designated Covid Infusion Parking spots. There is a phone number on the sign, please call and let the staff know what spot you are in and we will come out and get you. For questions call [phone].  Thanks.

## 2020-03-19 NOTE — Progress Notes (Signed)
Pt AAox4, VS stable on room air. Pt up independently with cane. No complaints today. Plan is for patient to discharge home with self care. Discharge instructions reviewed with patient no questions or concerns. IV removed. BSC and Rolator delivered to patient. Daughter to transfer patient home.

## 2020-03-19 NOTE — Discharge Instructions (Signed)
Patient scheduled for outpatient Remdesivir infusions at noon on Saturday 10/16 and Sunday 10/17  at [facility]. Please inform the patient to park at [place], as staff will be escorting the patient through the [place] entrance of the hospital. Appointments take approximately 45 minutes.    There is a wave flag banner located near the entrance on [address]. Turn into this entrance and immediately turn left or right and park in 1 of the 10 designated Covid Infusion Parking spots. There is a phone number on the sign, please call and let the staff know what spot you are in and we will come out and get you. For questions call [phone].  Thanks.

## 2020-03-19 NOTE — Care Management Important Message (Signed)
Important Message  Patient Details  Name: Christine Burnett MRN: [id number] Date of Birth: 1940-07-23   Medicare Important Message Given:  N/A - LOS <3 / Initial given by admissions     Dannette Barbara 03/19/2020, 11:28 AM

## 2020-03-19 NOTE — TOC Transition Note (Signed)
Transition of Care [facility]) - CM/SW Discharge Note   Patient Details  Name: Christine Burnett MRN: [id number] Date of Birth: 31-Jul-1940  Transition of Care [facility]) CM/SW Contact:  Shelbie Hutching, RN Phone Number: 03/19/2020, 12:42 PM   Clinical Narrative:    Patient admitted to the hospital with [place].  Patient is not requiring supplemental oxygen and is medically stable for discharge today.  Patient lives with her daughter Christine Burnett in [place].  PT recommends home health services and patient agrees, patient would also like to have a 3 in 1 and rollator.  [facility] given referral for equipment and will deliver to the patient's room before discharge.  Patient is scheduled for her last 2 remdesivir infusions at [facility] for tomorrow and Sunday, daughter will be able to transport her.  Corene Cornea with [facility] has accepted the home health referral for PT and OT.   Final next level of care: [place] Barriers to Discharge: Barriers Resolved   Patient Goals and CMS Choice Patient states their goals for this hospitalization and ongoing recovery are:: Glad to be going home today CMS Medicare.gov Compare Post Acute Care list provided to:: Patient Choice offered to / list presented to : Patient  Discharge Placement                       Discharge Plan and Services   Discharge Planning Services: CM Consult Post Acute Care Choice: Home Health          DME Arranged: 3-N-1, Walker rolling with seat DME Agency: [facility] Date DME Agency Contacted: 03/19/20 Time DME Agency Contacted: 1238 Representative spoke with at DME Agency: Mardene Celeste [place]: PT, OT [facility] Agency: [facility] ([place]) Date Dean: 03/19/20   Representative spoke with at [place]: [place] ([place]) Interventions     Readmission Risk Interventions No flowsheet data found.

## 2020-03-19 NOTE — Discharge Summary (Signed)
Physician Discharge Summary  Christine Burnett QBH:[id number] DOB: [birth year] DOA: 03/17/2020  PCP: Delsa Grana, PA-C  Admit date: 03/17/2020 Discharge date: 03/19/2020  Admitted From: Home Disposition: Home   Recommendations for Outpatient Follow-up:  1. Follow up with PCP in 1-2 weeks 2. NEEDS FOLLOW UP CHEST XRAY: Rounded masslike opacity in the right infrahilar lung, could reflect an acute pneumonia in the setting of COVID-19 given some additional streaky and ill-defined opacities elsewhere. However, this finding warrants at minimum short-term (6 week) follow-up imaging after therapeutic intervention to ensure resolution, otherwise cross-sectional imaging should be obtained.  Home Health: Pt, OT Equipment/Devices: RW, 3in1 Discharge Condition: Stable, improved CODE STATUS: Full Diet recommendation: Heart healthy  Brief/Interim Summary: Christine Burnett is an 79 y.o. female with a history of HTN, HLD, stage IIIb CKD, anxiety, and covid-19 diagnosed 10/8 who presented to the ED 10/13 with progressive shortness of breath, fatigue, generalized weakness. In the ED she was not hypoxemic but was tachypneic. CRP 1, d-dimer 2,779, PCT negative, and CXR demonstrated rounded masslike right infrahilar opacity felt to represent pneumonia. Steroids and remdesivir were given initially and the patient admitted. She remained without hypoxemia even with exertion, so steroids were not continued. Inflammatory markers have improved and the patient is symptomatically much better, stable for discharge and has transportation to continue remdesivir as an outpatient.  Discharge Diagnoses:  Principal Problem:   Pneumonia due to COVID-19 virus Active Problems:   HTN (hypertension)   Anxiety   Hyperlipidemia   Chronic kidney disease, stage 3b ([place])   Hypoxia  Covid-19 pneumonia: SARS-CoV-2 positive on 10/8. CRP only 1, CXR with right infrahilar mass. PCT negative. Got covid from family who lives with her.   - Continue remdesivir x5 days (10/13 - 10/17). We have secured appointments and transportation to complete this as an outpatient.   - No steroids at this time (given initial dose in ED but currently confirmed not to be hypoxemic).  - Continue airborne, contact precautions for 21 days from positive testing.  Stage IIIb CKD: CrCl near baseline.  - Avoid CTA chest if possible. Since not hypoxemic, tachycardic, will defer this for now.   Infrahilar pulmonary masslike opacity:  - NEEDS REPEAT CXR IN 6 WEEKS TO CONFIRM RESOLUTION OF RIGHT SIDED PULMONARY OPACITY. This was discussed with the patient.  HLD:  - Continue statin  Depression, anxiety:  - Continue paxil, prn hydroxyzine.   HTN: Pt not on losartan due to dizziness. Currently normotensive.  Discharge Instructions Discharge Instructions    Diet - low sodium heart healthy   Complete by: As directed    Discharge instructions   Complete by: As directed    You are being discharged from the hospital after treatment for covid-19 infection. You are felt to be stable enough to no longer require inpatient monitoring, testing, and treatment, though you will need to follow the recommendations below: - Continue taking remdesivir at the infusion clinic at [facility] will be scheduled for you and included with directions before your discharge.  - Per CDC guidelines, you will need to remain in isolation for 21 days from your first positive covid test. - Follow up with your doctor in the next week via telehealth or seek medical attention right away if your symptoms get WORSE.  - IT IS VERY IMPORTANT THAT YOU GET A REPEAT CHEST XRAY IN ABOUT 6 WEEKS TO [place] IS DUE TO INFECTION AND NOT DUE TO CANCER OR ANOTHER  EXPLANATION. - You are still encouraged to get a covid vaccination between 21 days (after isolation period ends) and 90 days (before immunity is thought to wear off).  Directions for you  at home:  Wear a facemask You should wear a facemask that covers your nose and mouth when you are in the same room with other people and when you visit a healthcare provider. People who live with or visit you should also wear a facemask while they are in the same room with you.  Separate yourself from other people in your home As much as possible, you should stay in a different room from other people in your home. Also, you should use a separate bathroom, if available.  Avoid sharing household items You should not share dishes, drinking glasses, cups, eating utensils, towels, bedding, or other items with other people in your home. After using these items, you should wash them thoroughly with soap and water.  Cover your coughs and sneezes Cover your mouth and nose with a tissue when you cough or sneeze, or you can cough or sneeze into your sleeve. Throw used tissues in a lined trash can, and immediately wash your hands with soap and water for at least 20 seconds or use an alcohol-based hand rub.  Wash your [facility] your hands often and thoroughly with soap and water for at least 20 seconds. You can use an alcohol-based hand sanitizer if soap and water are not available and if your hands are not visibly dirty. Avoid touching your eyes, nose, and mouth with unwashed hands.  Directions for those who live with, or provide care at home for you:  Limit the number of people who have contact with the patient If possible, have only one caregiver for the patient. Other household members should stay in another home or place of residence. If this is not possible, they should stay in another room, or be separated from the patient as much as possible. Use a separate bathroom, if available. Restrict visitors who do not have an essential need to be in the home.  Ensure good ventilation Make sure that shared spaces in the home have good air flow, such as from an air conditioner or an opened  window, weather permitting.  Wash your hands often Wash your hands often and thoroughly with soap and water for at least 20 seconds. You can use an alcohol based hand sanitizer if soap and water are not available and if your hands are not visibly dirty. Avoid touching your eyes, nose, and mouth with unwashed hands. Use disposable paper towels to dry your hands. If not available, use dedicated cloth towels and replace them when they become wet.  Wear a facemask and gloves Wear a disposable facemask at all times in the room and gloves when you touch or have contact with the patient's blood, body fluids, and/or secretions or excretions, such as sweat, saliva, sputum, nasal mucus, vomit, urine, or feces.  Ensure the mask fits over your nose and mouth tightly, and do not touch it during use. Throw out disposable facemasks and gloves after using them. Do not reuse. Wash your hands immediately after removing your facemask and gloves. If your personal clothing becomes contaminated, carefully remove clothing and launder. Wash your hands after handling contaminated clothing. Place all used disposable facemasks, gloves, and other waste in a lined container before disposing them with other household waste. Remove gloves and wash your hands immediately after handling these items.  Do not share dishes,  glasses, or other household items with the patient Avoid sharing household items. You should not share dishes, drinking glasses, cups, eating utensils, towels, bedding, or other items with a patient who is confirmed to have, or being evaluated for, COVID-19 infection. After the person uses these items, you should wash them thoroughly with soap and water.  Wash laundry thoroughly Immediately remove and wash clothes or bedding that have blood, body fluids, and/or secretions or excretions, such as sweat, saliva, sputum, nasal mucus, vomit, urine, or feces, on them. Wear gloves when handling laundry from the  patient. Read and follow directions on labels of laundry or clothing items and detergent. In general, wash and dry with the warmest temperatures recommended on the label.  Clean all areas the individual has used often Clean all touchable surfaces, such as counters, tabletops, doorknobs, bathroom fixtures, toilets, phones, keyboards, tablets, and bedside tables, every day. Also, clean any surfaces that may have blood, body fluids, and/or secretions or excretions on them. Wear gloves when cleaning surfaces the patient has come in contact with. Use a diluted bleach solution (e.g., dilute bleach with 1 part bleach and 10 parts water) or a household disinfectant with a label that says EPA-registered for coronaviruses. To make a bleach solution at home, add 1 tablespoon of bleach to 1 quart (4 cups) of water. For a larger supply, add  cup of bleach to 1 gallon (16 cups) of water. Read labels of cleaning products and follow recommendations provided on product labels. Labels contain instructions for safe and effective use of the cleaning product including precautions you should take when applying the product, such as wearing gloves or eye protection and making sure you have good ventilation during use of the product. Remove gloves and wash hands immediately after cleaning.  Monitor yourself for signs and symptoms of illness Caregivers and household members are considered close contacts, should monitor their health, and will be asked to limit movement outside of the home to the extent possible. Follow the monitoring steps for close contacts listed on the symptom monitoring form.  If you have additional questions, contact your local health department or call the epidemiologist on call at [phone] (available 24/7). This guidance is subject to change. For the most up-to-date guidance from CDC, please refer to their website: YouBlogs.pl    Increase activity slowly   Complete by: As directed      Allergies as of 03/19/2020      Reactions   Cefuroxime Axetil    caused rash.   Ciprofloxacin    Unknown   Codeine       Medication List    STOP taking these medications   losartan 25 MG tablet Commonly known as: COZAAR     TAKE these medications   acetaminophen 650 MG CR tablet Commonly known as: TYLENOL Take 1 tablet (650 mg total) by mouth 4 (four) times daily as needed for pain.   albuterol 108 (90 Base) MCG/ACT inhaler Commonly known as: VENTOLIN HFA Inhale 2 puffs into the lungs every 6 (six) hours as needed for wheezing or shortness of breath.   aspirin 81 MG EC tablet Take 81 mg by mouth daily. Swallow whole.   atorvastatin 10 MG tablet Commonly known as: LIPITOR Take 1 tablet (10 mg total) by mouth at bedtime.   hydrOXYzine 10 MG tablet Commonly known as: ATARAX/VISTARIL Take 1 tablet (10 mg total) by mouth every 8 (eight) hours as needed for anxiety.   melatonin 5 MG Tabs Take 5 mg by  mouth at bedtime. Pt taking 2 tabs qhs   PARoxetine 40 MG tablet Commonly known as: PAXIL Take 1 tablet (40 mg total) by mouth every morning.   PROBIOTIC-10 PO Take by mouth.   Vitamin B-12 500 MCG Subl One tablet dissolved under the tongue once a day   Vitamin D-3 25 MCG (1000 UT) Caps Take 2 capsules by mouth daily. Takes occassionally       Follow-up Information    Delsa Grana, PA-C. Schedule an appointment as soon as possible for a visit in 1 week(s).   Specialty: Family Medicine Contact information: [address] [place] [address] [phone]              Allergies  Allergen Reactions  . Cefuroxime Axetil     caused rash.  . Ciprofloxacin     Unknown  . Codeine     Consultations:  None  Procedures/Studies: DG Chest 2 View  Result Date: 03/17/2020 CLINICAL DATA:  COVID-19 positive 6 days prior EXAM: CHEST - 2 VIEW COMPARISON:  Radiograph 03/25/2017 FINDINGS:  Rounded masslike opacity present in the right infrahilar lung. Additional patchy and streaky opacities are present in the left infrahilar lung as well. No pneumothorax or effusion. Chronic hyperinflation with emphysematous features. The aorta is calcified. The remaining cardiomediastinal contours are unremarkable. No acute osseous or soft tissue abnormality. Degenerative changes are present in the imaged spine and shoulders. IMPRESSION: Rounded masslike opacity in the right infrahilar lung, could reflect an acute pneumonia in the setting of COVID-19 given some additional streaky and ill-defined opacities elsewhere. However, this finding warrants at minimum short-term (6 week) follow-up imaging after therapeutic intervention to ensure resolution, otherwise cross-sectional imaging should be obtained. Electronically Signed   By: Lovena Le M.D.   On: 03/17/2020 16:26    Subjective: Feels "great." Eating, getting around ok, no shortness of breath. No GI upset.   Discharge Exam: Vitals:   03/19/20 0443 03/19/20 0823  BP: 115/74 112/62  Pulse: 91 (!) 107  Resp: 18 16  Temp: 98.7 F (37.1 C) 98.6 F (37 C)  SpO2: 95% 94%   General: Pt is alert, awake, not in acute distress Cardiovascular: RRR, S1/S2 +, no rubs, no gallops Respiratory: CTA bilaterally, no wheezing, no rhonchi Abdominal: Soft, NT, ND, bowel sounds + Extremities: No edema, no cyanosis  Labs: BNP (last 3 results) No results for input(s): BNP in the last 8760 hours. Basic Metabolic Panel: Recent Labs  Lab 03/17/20 1553 03/18/20 0543 03/19/20 0712  NA 136 140 137  K 4.1 4.3 4.2  CL 103 109 107  CO2 24 23 23   GLUCOSE 116* 163* 94  BUN 22 22 34*  CREATININE 1.67* 1.49* 1.47*  CALCIUM 9.1 8.5* 8.8*   Liver Function Tests: Recent Labs  Lab 03/17/20 1553 03/18/20 0543 03/19/20 0712  AST 29 27 31   ALT 17 16 17   ALKPHOS 75 65 70  BILITOT 0.7 0.5 0.6  PROT 7.8 7.0 6.7  ALBUMIN 3.9 3.3* 3.2*   No results for  input(s): LIPASE, AMYLASE in the last 168 hours. No results for input(s): AMMONIA in the last 168 hours. CBC: Recent Labs  Lab 03/17/20 1553 03/18/20 0543 03/19/20 0712  WBC 3.8* 1.8* 5.8  NEUTROABS 2.5 1.1* 3.5  HGB 13.0 12.0 12.3  HCT 37.6 35.1* 36.1  MCV 96.2 96.4 96.3  PLT 152 147* 168   Cardiac Enzymes: No results for input(s): CKTOTAL, CKMB, CKMBINDEX, TROPONINI in the last 168 hours. BNP: Invalid input(s):  POCBNP CBG: No results for input(s): GLUCAP in the last 168 hours. D-Dimer No results for input(s): DDIMER in the last 72 hours. Hgb A1c No results for input(s): HGBA1C in the last 72 hours. Lipid Profile No results for input(s): CHOL, HDL, LDLCALC, TRIG, CHOLHDL, LDLDIRECT in the last 72 hours. Thyroid function studies No results for input(s): TSH, T4TOTAL, T3FREE, THYROIDAB in the last 72 hours.  Invalid input(s): FREET3 Anemia work up Recent Labs    03/18/20 0543  FERRITIN 216   Urinalysis    Component Value Date/Time   COLORURINE YELLOW 02/23/2017 [place] 02/23/2017 1426   APPEARANCEUR Clear 01/28/2016 0841   LABSPEC 1.015 02/23/2017 1426   PHURINE 5.5 02/23/2017 1426   GLUCOSEU NEGATIVE 02/23/2017 1426   HGBUR SMALL (A) 02/23/2017 1426   BILIRUBINUR neg 07/29/2019 1541   BILIRUBINUR Negative 01/28/2016 0841   KETONESUR NEGATIVE 02/23/2017 1426   PROTEINUR Negative 07/29/2019 1541   PROTEINUR NEGATIVE 02/23/2017 1426   UROBILINOGEN 0.2 07/29/2019 1541   NITRITE neg 07/29/2019 1541   NITRITE NEGATIVE 02/23/2017 1426   LEUKOCYTESUR Negative 07/29/2019 1541   LEUKOCYTESUR Trace (A) 01/28/2016 0841    Microbiology Recent Results (from the past 240 hour(s))  Culture, blood (Routine x 2)     Status: None (Preliminary result)   Collection Time: 03/17/20  3:42 PM   Specimen: BLOOD  Result Value Ref Range Status   Specimen Description BLOOD RIGHT ANTECUBITAL  Final   Special Requests   Final    BOTTLES DRAWN AEROBIC AND  ANAEROBIC Blood Culture adequate volume   Culture   Final    NO GROWTH 2 DAYS Performed at [facility], [address] [place], [place] [address]    Report Status PENDING  Incomplete  Respiratory Panel by RT PCR (Flu A&B, Covid) - Nasopharyngeal Swab     Status: Abnormal   Collection Time: 03/17/20  7:56 PM   Specimen: Nasopharyngeal Swab  Result Value Ref Range Status   SARS Coronavirus 2 by RT PCR POSITIVE (A) NEGATIVE Final    Comment: RESULT CALLED TO, READ BACK BY AND VERIFIED WITH: Hamlin RN 2135 03/17/20 HNM (NOTE) SARS-CoV-2 target nucleic acids are DETECTED.  SARS-CoV-2 RNA is generally detectable in upper respiratory specimens  during the acute phase of infection. Positive results are indicative of the presence of the identified virus, but do not rule out bacterial infection or co-infection with other pathogens not detected by the test. Clinical correlation with patient history and other diagnostic information is necessary to determine patient infection status. The expected result is Negative.  Fact Sheet for Patients:  PinkCheek.be  Fact Sheet for Healthcare Providers: GravelBags.it  This test is not yet approved or cleared by the [place] FDA and  has been authorized for detection and/or diagnosis of SARS-CoV-2 by FDA under an Emergency Use Authorization (EUA).  This EUA will remain in effect (meaning this test can be  used) for the duration of  the COVID-19 declaration under Section 564(b)(1) of the Act, 21 U.S.C. section 360bbb-3(b)(1), unless the authorization is terminated or revoked sooner.      Influenza A by PCR NEGATIVE NEGATIVE Final   Influenza B by PCR NEGATIVE NEGATIVE Final    Comment: (NOTE) The Xpert Xpress SARS-CoV-2/FLU/RSV assay is intended as an aid in  the diagnosis of influenza from Nasopharyngeal swab specimens and  should not be used as a sole basis for  treatment. Nasal washings and  aspirates are unacceptable for Xpert Xpress  SARS-CoV-2/FLU/RSV  testing.  Fact Sheet for Patients: PinkCheek.be  Fact Sheet for Healthcare Providers: GravelBags.it  This test is not yet approved or cleared by the [place] FDA and  has been authorized for detection and/or diagnosis of SARS-CoV-2 by  FDA under an Emergency Use Authorization (EUA). This EUA will remain  in effect (meaning this test can be used) for the duration of the  Covid-19 declaration under Section 564(b)(1) of the Act, 21  U.S.C. section 360bbb-3(b)(1), unless the authorization is  terminated or revoked. Performed at [facility], [address] [place], [place] [address]   Culture, blood (Routine X 2) w Reflex to ID Panel     Status: None (Preliminary result)   Collection Time: 03/18/20  5:42 AM   Specimen: BLOOD  Result Value Ref Range Status   Specimen Description BLOOD RIGHT ARM  Final   Special Requests   Final    BOTTLES DRAWN AEROBIC AND ANAEROBIC Blood Culture results may not be optimal due to an excessive volume of blood received in culture bottles   Culture   Final    NO GROWTH 1 DAY Performed at [facility], [address] [place], [place] [address]    Report Status PENDING  Incomplete    Time coordinating discharge: Approximately 40 minutes  Patrecia Pour, MD  [facility] 03/19/2020, 11:11 AM

## 2020-03-20 ENCOUNTER — Ambulatory Visit (HOSPITAL_COMMUNITY)
Admit: 2020-03-20 | Discharge: 2020-03-20 | Disposition: A | Discharge status: Home / Self Care | Payer: Medicare Other | Source: Ambulatory Visit | Attending: Pulmonary Disease | Admitting: Pulmonary Disease

## 2020-03-20 DIAGNOSIS — I129 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 stage 4 chronic kidney disease, or unspecified chronic kidney disease: Secondary | ICD-10-CM | POA: Diagnosis not present

## 2020-03-20 DIAGNOSIS — J1282 Pneumonia due to coronavirus disease 2019: Secondary | ICD-10-CM | POA: Diagnosis not present

## 2020-03-20 DIAGNOSIS — F419 Anxiety disorder, unspecified: Secondary | ICD-10-CM | POA: Diagnosis not present

## 2020-03-20 DIAGNOSIS — F32A Depression, unspecified: Secondary | ICD-10-CM | POA: Diagnosis not present

## 2020-03-20 DIAGNOSIS — N1832 Chronic kidney disease, stage 3b: Secondary | ICD-10-CM | POA: Diagnosis not present

## 2020-03-20 DIAGNOSIS — K5790 Diverticulosis of intestine, part unspecified, without perforation or abscess without bleeding: Secondary | ICD-10-CM | POA: Diagnosis not present

## 2020-03-20 DIAGNOSIS — J42 Unspecified chronic bronchitis: Secondary | ICD-10-CM | POA: Diagnosis not present

## 2020-03-20 DIAGNOSIS — Z87891 Personal history of nicotine dependence: Secondary | ICD-10-CM | POA: Diagnosis not present

## 2020-03-20 DIAGNOSIS — E785 Hyperlipidemia, unspecified: Secondary | ICD-10-CM | POA: Diagnosis not present

## 2020-03-20 DIAGNOSIS — Z8673 Personal history of transient ischemic attack (TIA), and cerebral infarction without residual deficits: Secondary | ICD-10-CM | POA: Diagnosis not present

## 2020-03-20 DIAGNOSIS — Z9181 History of falling: Secondary | ICD-10-CM | POA: Diagnosis not present

## 2020-03-20 DIAGNOSIS — U071 COVID-19: Secondary | ICD-10-CM | POA: Diagnosis not present

## 2020-03-20 DIAGNOSIS — H179 Unspecified corneal scar and opacity: Secondary | ICD-10-CM | POA: Diagnosis not present

## 2020-03-20 DIAGNOSIS — Z8781 Personal history of (healed) traumatic fracture: Secondary | ICD-10-CM | POA: Diagnosis not present

## 2020-03-20 DIAGNOSIS — I4891 Unspecified atrial fibrillation: Secondary | ICD-10-CM | POA: Diagnosis not present

## 2020-03-20 DIAGNOSIS — J302 Other seasonal allergic rhinitis: Secondary | ICD-10-CM | POA: Diagnosis not present

## 2020-03-20 DIAGNOSIS — Z7982 Long term (current) use of aspirin: Secondary | ICD-10-CM | POA: Diagnosis not present

## 2020-03-20 DIAGNOSIS — N2581 Secondary hyperparathyroidism of renal origin: Secondary | ICD-10-CM | POA: Diagnosis not present

## 2020-03-20 DIAGNOSIS — R918 Other nonspecific abnormal finding of lung field: Secondary | ICD-10-CM | POA: Diagnosis not present

## 2020-03-20 DIAGNOSIS — M810 Age-related osteoporosis without current pathological fracture: Secondary | ICD-10-CM | POA: Diagnosis not present

## 2020-03-20 MED ORDER — FAMOTIDINE IN NACL 20-0.9 MG/50ML-% IV SOLN: 20.0000 mg | Freq: Once | INTRAVENOUS | Status: DC | PRN

## 2020-03-20 MED ORDER — SODIUM CHLORIDE 0.9 % IV SOLN: INTRAVENOUS | Status: DC | PRN

## 2020-03-20 MED ORDER — ALBUTEROL SULFATE HFA 108 (90 BASE) MCG/ACT IN AERS: 2.0000 | INHALATION_SPRAY | Freq: Once | RESPIRATORY_TRACT | Status: DC | PRN

## 2020-03-20 MED ORDER — METHYLPREDNISOLONE SODIUM SUCC 125 MG IJ SOLR: 125.0000 mg | Freq: Once | INTRAMUSCULAR | Status: DC | PRN

## 2020-03-20 MED ORDER — DIPHENHYDRAMINE HCL 50 MG/ML IJ SOLN: 50.0000 mg | Freq: Once | INTRAMUSCULAR | Status: DC | PRN

## 2020-03-20 MED ORDER — EPINEPHRINE 0.3 MG/0.3ML IJ SOAJ: 0.3000 mg | Freq: Once | INTRAMUSCULAR | Status: DC | PRN

## 2020-03-20 MED ORDER — SODIUM CHLORIDE 0.9 % IV SOLN
100.0000 mg | Freq: Once | INTRAVENOUS | Status: AC
  Administered 2020-03-20: 100 mg via INTRAVENOUS
  Filled 2020-03-20: qty 20

## 2020-03-21 ENCOUNTER — Ambulatory Visit (HOSPITAL_COMMUNITY)
Admit: 2020-03-21 | Discharge: 2020-03-21 | Disposition: A | Discharge status: Home / Self Care | Payer: Medicare Other | Source: Ambulatory Visit | Attending: Pulmonary Disease | Admitting: Pulmonary Disease

## 2020-03-21 DIAGNOSIS — J1282 Pneumonia due to coronavirus disease 2019: Secondary | ICD-10-CM | POA: Diagnosis not present

## 2020-03-21 DIAGNOSIS — U071 COVID-19: Secondary | ICD-10-CM | POA: Diagnosis not present

## 2020-03-21 MED ORDER — SODIUM CHLORIDE 0.9 % IV SOLN: INTRAVENOUS | Status: DC | PRN

## 2020-03-21 MED ORDER — SODIUM CHLORIDE 0.9 % IV SOLN
100.0000 mg | Freq: Once | INTRAVENOUS | Status: AC
  Administered 2020-03-21: 100 mg via INTRAVENOUS
  Filled 2020-03-21: qty 20

## 2020-03-21 MED ORDER — ALBUTEROL SULFATE HFA 108 (90 BASE) MCG/ACT IN AERS: 2.0000 | INHALATION_SPRAY | Freq: Once | RESPIRATORY_TRACT | Status: DC | PRN

## 2020-03-21 MED ORDER — METHYLPREDNISOLONE SODIUM SUCC 125 MG IJ SOLR: 125.0000 mg | Freq: Once | INTRAMUSCULAR | Status: DC | PRN

## 2020-03-21 MED ORDER — DIPHENHYDRAMINE HCL 50 MG/ML IJ SOLN: 50.0000 mg | Freq: Once | INTRAMUSCULAR | Status: DC | PRN

## 2020-03-21 MED ORDER — FAMOTIDINE IN NACL 20-0.9 MG/50ML-% IV SOLN: 20.0000 mg | Freq: Once | INTRAVENOUS | Status: DC | PRN

## 2020-03-21 MED ORDER — EPINEPHRINE 0.3 MG/0.3ML IJ SOAJ: 0.3000 mg | Freq: Once | INTRAMUSCULAR | Status: DC | PRN

## 2020-03-21 NOTE — Discharge Instructions (Signed)
10 Things You Can Do to Manage Your COVID-19 Symptoms at Home If you have possible or confirmed COVID-19: 1. Stay home from work and school. And stay away from other public places. If you must go out, avoid using any kind of public transportation, ridesharing, or taxis. 2. Monitor your symptoms carefully. If your symptoms get worse, call your healthcare provider immediately. 3. Get rest and stay hydrated. 4. If you have a medical appointment, call the healthcare provider ahead of time and tell them that you have or may have COVID-19. 5. For medical emergencies, call 911 and notify the dispatch personnel that you have or may have COVID-19. 6. Cover your cough and sneezes with a tissue or use the inside of your elbow. 7. Wash your hands often with soap and water for at least 20 seconds or clean your hands with an alcohol-based hand sanitizer that contains at least 60% alcohol. 8. As much as possible, stay in a specific room and away from other people in your home. Also, you should use a separate bathroom, if available. If you need to be around other people in or outside of the home, wear a mask. 9. Avoid sharing personal items with other people in your household, like dishes, towels, and bedding. 10. Clean all surfaces that are touched often, like counters, tabletops, and doorknobs. Use household cleaning sprays or wipes according to the label instructions. cdc.gov/coronavirus 12/04/2018 This information is not intended to replace advice given to you by your health care provider. Make sure you discuss any questions you have with your health care provider. Document Revised: 05/08/2019 Document Reviewed: 05/08/2019 Elsevier Patient Education  2020 Elsevier Inc.  

## 2020-03-21 NOTE — Progress Notes (Signed)
  Diagnosis: COVID-19  Physician: Dr. Joya Gaskins   Procedure: [facility] Infusion Clinic Med: remdesivir infusion - Provided patient with remdesivir fact sheet for patients, parents and caregivers prior to infusion.  Complications: No immediate complications noted.  Discharge: Discharged home   Gabriel Cirri 03/21/2020

## 2020-03-22 ENCOUNTER — Telehealth: Payer: Self-pay | Admitting: Family Medicine

## 2020-03-22 ENCOUNTER — Telehealth: Payer: Self-pay

## 2020-03-22 LAB — CULTURE, BLOOD (ROUTINE X 2)
Culture: NO GROWTH
Special Requests: ADEQUATE

## 2020-03-22 NOTE — Telephone Encounter (Signed)
Transition Care Management Follow-up Telephone Call  Date of discharge and from where: 03/19/20 [facility]  How have you been since you were released from the hospital? Pt states she is doing better but not all the way back to normal; feels weak, no energy, sleepy.   Any questions or concerns? No  Items Reviewed:  Did the pt receive and understand the discharge instructions provided? Yes   Medications obtained and verified? Yes   Other? No   Any new allergies since your discharge? No   Dietary orders reviewed? Yes  Do you have support at home? Yes  Home Care and Equipment/Supplies: Were home health services ordered? yes If so, what is the name of the agency? [facility]  Has the agency set up a time to come to the patient's home? yes   Functional Questionnaire: (I = Independent and D = Dependent) ADLs: D  Bathing/Dressing- D  Meal Prep- D  Eating- I  Maintaining continence- I  Transferring/Ambulation- I  Managing Meds- I  Follow up appointments reviewed:   PCP Hospital f/u appt confirmed? Yes  Scheduled to see Delsa Grana PAC on 04/01/20 @ 3:00 virtual.  Are transportation arrangements needed? No   If their condition worsens, is the pt aware to call PCP or go to the Emergency Dept.? Yes  Was the patient provided with contact information for the PCP's office or ED? Yes  Was to pt encouraged to call back with questions or concerns? Yes

## 2020-03-22 NOTE — Telephone Encounter (Signed)
Home Health Verbal Orders - Caller/Agency: Alex// [facility] Callback Number: [phone] [id number]  [id number] OT/PT/Skilled Nursing/Social Work/Speech Therapy: PT  Frequency: 1x for 1wk, 2x for 6wks, 1x for 2wks

## 2020-03-23 LAB — CULTURE, BLOOD (ROUTINE X 2): Culture: NO GROWTH

## 2020-03-23 NOTE — Telephone Encounter (Signed)
Thank you, I think its important to touch base with her, even if its just on the phone or if family member can help with cell phone - go over f/up and recheck her  Thanks Mechele Claude

## 2020-03-25 ENCOUNTER — Ambulatory Visit: Payer: Self-pay | Admitting: *Deleted

## 2020-03-25 NOTE — Chronic Care Management (AMB) (Signed)
CCM status changed to previously enrolled.  [name], [facility] Administrator[facility] [phone]

## 2020-03-29 NOTE — Telephone Encounter (Signed)
Jeani Hawking called reaching out for the third time she said trying to get verbal orders for this patient. She can be reached at [phone] option 2. Please advise

## 2020-03-30 ENCOUNTER — Telehealth: Payer: Self-pay | Admitting: Family Medicine

## 2020-03-30 NOTE — Telephone Encounter (Signed)
Jeani Hawking notified for PT and OT per Kristeen Miss

## 2020-03-30 NOTE — Telephone Encounter (Signed)
Per Jerelene Redden at [facility] notified of verbal order PT and OT

## 2020-03-30 NOTE — Telephone Encounter (Signed)
Called to request a verbal for OT - 2wk 2.  For questions please call at [phone]

## 2020-03-31 ENCOUNTER — Telehealth: Payer: Self-pay

## 2020-03-31 NOTE — Telephone Encounter (Signed)
Copied from [place] [id number]. Topic: General - Other >> Mar 25, 2020  3:15 PM [name] wrote: Osvaldo Human name: Jeani Hawking  Relation to pt: QA from [facility]  Call back number: [phone] option 2    Reason for call: requesting verbal orders for PT home health PT 1x 1 2x 6 1x 2

## 2020-04-01 ENCOUNTER — Telehealth: Payer: Medicare Other | Admitting: Family Medicine

## 2020-04-01 NOTE — Telephone Encounter (Signed)
[facility] notified Jeani Hawking)

## 2020-04-02 ENCOUNTER — Encounter: Payer: Self-pay | Admitting: Family Medicine

## 2020-04-02 ENCOUNTER — Telehealth (INDEPENDENT_AMBULATORY_CARE_PROVIDER_SITE_OTHER): Payer: Medicare Other | Admitting: Family Medicine

## 2020-04-02 ENCOUNTER — Other Ambulatory Visit: Payer: Self-pay

## 2020-04-02 VITALS — BP 90/60 | HR 98 | Temp 98.2°F

## 2020-04-02 DIAGNOSIS — U071 COVID-19: Secondary | ICD-10-CM | POA: Diagnosis not present

## 2020-04-02 DIAGNOSIS — J1282 Pneumonia due to coronavirus disease 2019: Secondary | ICD-10-CM | POA: Diagnosis not present

## 2020-04-02 DIAGNOSIS — Z09 Encounter for follow-up examination after completed treatment for conditions other than malignant neoplasm: Secondary | ICD-10-CM

## 2020-04-02 DIAGNOSIS — J40 Bronchitis, not specified as acute or chronic: Secondary | ICD-10-CM | POA: Diagnosis not present

## 2020-04-02 DIAGNOSIS — R9389 Abnormal findings on diagnostic imaging of other specified body structures: Secondary | ICD-10-CM

## 2020-04-02 MED ORDER — PROMETHAZINE HCL 6.25 MG/5ML PO SYRP: 6.2500 mg | ORAL_SOLUTION | Freq: Four times a day (QID) | ORAL | 0 refills | Status: DC | PRN

## 2020-04-02 MED ORDER — PREDNISONE 20 MG PO TABS: 40.0000 mg | ORAL_TABLET | Freq: Every day | ORAL | 0 refills | Status: AC

## 2020-04-02 MED ORDER — BENZONATATE 100 MG PO CAPS: 100.0000 mg | ORAL_CAPSULE | Freq: Three times a day (TID) | ORAL | 0 refills | Status: DC | PRN

## 2020-04-02 MED ORDER — AZITHROMYCIN 250 MG PO TABS: 250.0000 mg | ORAL_TABLET | Freq: Every day | ORAL | 0 refills | Status: DC

## 2020-04-02 NOTE — Progress Notes (Signed)
Patient ID: Christine Burnett, female    DOB: November 06, 1940, 79 y.o.   MRN: [id number]  PCP: Delsa Grana, PA-C  Chief Complaint  Patient presents with  . Hospitalization Follow-up  . Cough    I connected with  [name]  on 04/02/20 at  1:00 PM EDT by a video enabled telemedicine application and verified that I am speaking with the correct person using two identifiers.  I discussed the limitations of evaluation and management by telemedicine and the availability of in person appointments. The patient expressed understanding and agreed to proceed. Staff also discussed with the patient that there may be a patient responsible charge related to this service. Patient Location: home Provider Location: [facility] Additional Individuals present: none  Subjective:   Christine Burnett is a 79 y.o. female, presents to clinic with CC of the following:  HFU after admission for COVID pneumonia Here for hospital follow up/transition of care.  Admit date: 03/17/2020 Discharge date: 03/19/2020   Transition of care was initiated previously by Clemetine Marker on 04/22/2020 and med changes, diagnosis, specialist follow ups and pts symptoms and condition were all reviewed.    Admitted From: Home Disposition: Home  discharge summary, results and hospital course reviewed:  Recommendations for Outpatient Follow-up:  1. Follow up with PCP in 1-2 weeks 2. NEEDS FOLLOW UP CHEST XRAY: Rounded masslike opacity in the right infrahilar lung, could reflect an acute pneumonia in the setting of COVID-19 given some additional streaky and ill-defined opacities elsewhere. However, this finding warrants at minimum short-term (6 week) follow-up imaging after therapeutic intervention to ensure resolution, otherwise cross-sectional imaging should be obtained.  Home Health: Pt, OT Equipment/Devices: RW, 3in1 Discharge Condition: Stable, improved CODE STATUS: Full Diet recommendation: Heart  healthy  Brief/Interim Summary: FREDRICKA KOHRS an79 y.o.femalewith a history of HTN, HLD, stage IIIb CKD, anxiety, and covid-19 diagnosed 10/8 who presented to the ED 10/13 with progressive shortness of breath, fatigue, generalized weakness. In the ED she was not hypoxemic but was tachypneic. CRP 1, d-dimer 2,779, PCT negative, and CXR demonstrated rounded masslike right infrahilar opacity felt to represent pneumonia. Steroids and remdesivir were given initially and the patient admitted.She remained without hypoxemia even with exertion, so steroids were not continued. Inflammatory markers have improved and the patient is symptomatically much better, stable for discharge and has transportation to continue remdesivir as an outpatient.  Pt was admitted for COVID New medications started per hospitalization include using albuterol Labs due today are none Pt still having chest tightness/pressure and a lot of chest congestion and SOB.  Cough meds helped a little.   No fever, chills, sweats or CP She has low/soft BP and gets dizzy and exhausted when she stands up or does minor activity, but no syncope. BP today 90/60 Pulse ox 97%, and lowest since home was 93%  Allergy to mucinex - states it makes her crazy Allergic to codeine      Patient Active Problem List   Diagnosis Date Noted  . Pneumonia due to COVID-19 virus 03/17/2020  . Hypoxia 03/17/2020  . Prediabetes 08/13/2018  . Major depressive disorder, recurrent ([place]) 06/13/2018  . Allergic rhinitis 09/27/2017  . 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 Chronic kidney disease, stage 3b ([place]) 08/31/2017  . 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 Osteoporosis without current pathological fracture 03/30/2017  . Cardiac murmur 04/07/2015  . 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 stage 4 chronic kidney disease, or unspecified chronic kidney disease  02/10/2015  . Anxiety 01/05/2015  . Hyperlipidemia  01/05/2015  . HTN (hypertension) 09/24/2014      Current Outpatient Medications:  .  acetaminophen (TYLENOL) 650 MG CR tablet, Take 1 tablet (650 mg total) by mouth 4 (four) times daily as needed for pain., Disp: , Rfl:  .  albuterol (VENTOLIN HFA) 108 (90 Base) MCG/ACT inhaler, Inhale 2 puffs into the lungs every 6 (six) hours as needed for wheezing or shortness of breath., Disp: 1 each, Rfl: 0 .  aspirin 81 MG EC tablet, Take 81 mg by mouth daily. Swallow whole., Disp: , Rfl:  .  atorvastatin (LIPITOR) 10 MG tablet, Take 1 tablet (10 mg total) by mouth at bedtime., Disp: 90 tablet, Rfl: 3 .  Cholecalciferol (VITAMIN D-3) 1000 units CAPS, Take 2 capsules by mouth daily. Takes occassionally, Disp: , Rfl:  .  hydrOXYzine (ATARAX/VISTARIL) 10 MG tablet, Take 1 tablet (10 mg total) by mouth every 8 (eight) hours as needed for anxiety., Disp: 60 tablet, Rfl: 3 .  melatonin 5 MG TABS, Take 5 mg by mouth at bedtime. Pt taking 2 tabs qhs, Disp: , Rfl:  .  PARoxetine (PAXIL) 40 MG tablet, Take 1 tablet (40 mg total) by mouth every morning., Disp: 90 tablet, Rfl: 3 .  Probiotic Product (PROBIOTIC-10 PO), Take by mouth., Disp: , Rfl:  .  Cyanocobalamin (VITAMIN B-12) 500 MCG SUBL, One tablet dissolved under the tongue once a day (Patient not taking: Reported on 03/17/2020), Disp: 100 tablet, Rfl: 3   Allergies  Allergen Reactions  . Cefuroxime Axetil     caused rash.  . Ciprofloxacin     Unknown  . Codeine      Social History   Tobacco Use  . Smoking status: Former Smoker    Packs/day: 0.25    Years: 61.00    Pack years: 15.25    Types: Cigarettes    Quit date: 03/25/2017    Years since quitting: 3.0  . Smokeless tobacco: Never Used  . Tobacco comment: smoking cessation materials not required  Vaping Use  . Vaping Use: Never used  Substance Use Topics  . Alcohol use: No  . Drug use: No      Chart Review Today: I personally reviewed active problem list, medication list,  allergies, family history, social history, health maintenance, notes from last encounter, lab results, imaging with the patient/caregiver today.   Review of Systems 10 Systems reviewed and are negative for acute change except as noted in the HPI.     Objective:   Vitals:   04/02/20 1307  BP: 90/60  Pulse: 98  Temp: 98.2 F (36.8 C)  TempSrc: Oral  SpO2: 95%    There is no height or weight on file to calculate BMI.  Physical Exam Vitals and nursing note reviewed.  Constitutional:      Appearance: She is well-developed and normal weight. She is not ill-appearing, toxic-appearing or diaphoretic.     Comments: Non-toxic appearing, NAD, looks a little tired  Pulmonary:     Effort: Pulmonary effort is normal. No respiratory distress.     Breath sounds: No wheezing.  Neurological:     Mental Status: She is alert.  Psychiatric:        Mood and Affect: Mood normal.        Behavior: Behavior is cooperative.      PE limited by telephone encounter  Results for orders placed or performed during the hospital encounter of 03/17/20  Culture, blood (Routine  x 2)   Specimen: BLOOD  Result Value Ref Range   Specimen Description BLOOD RIGHT ANTECUBITAL    Special Requests      BOTTLES DRAWN AEROBIC AND ANAEROBIC Blood Culture adequate volume   Culture      NO GROWTH 5 DAYS Performed at [facility], [place], [address] [place], [place] [address]    Report Status 03/22/2020 FINAL   Respiratory Panel by RT PCR (Flu A&B, Covid) - Nasopharyngeal Swab   Specimen: Nasopharyngeal Swab  Result Value Ref Range   SARS Coronavirus 2 by RT PCR POSITIVE (A) NEGATIVE   Influenza A by PCR NEGATIVE NEGATIVE   Influenza B by PCR NEGATIVE NEGATIVE  Culture, blood (Routine X 2) w Reflex to ID Panel   Specimen: BLOOD  Result Value Ref Range   Specimen Description BLOOD RIGHT ARM    Special Requests      BOTTLES DRAWN AEROBIC AND ANAEROBIC Blood Culture results may not be optimal due to an  excessive volume of blood received in culture bottles   Culture      NO GROWTH 5 DAYS Performed at [facility], [address] [place], [place] [address]    Report Status 03/23/2020 FINAL   Comprehensive metabolic panel  Result Value Ref Range   Sodium 136 135 - 145 mmol/L   Potassium 4.1 3.5 - 5.1 mmol/L   Chloride 103 98 - 111 mmol/L   CO2 24 22 - 32 mmol/L   Glucose, Bld 116 (H) 70 - 99 mg/dL   BUN 22 8 - 23 mg/dL   Creatinine, Ser 1.67 (H) 0.44 - 1.00 mg/dL   Calcium 9.1 8.9 - 10.3 mg/dL   Total Protein 7.8 6.5 - 8.1 g/dL   Albumin 3.9 3.5 - 5.0 g/dL   AST 29 15 - 41 U/L   ALT 17 0 - 44 U/L   Alkaline Phosphatase 75 38 - 126 U/L   Total Bilirubin 0.7 0.3 - 1.2 mg/dL   GFR, Estimated 29 (L) >60 mL/min   Anion gap 9 5 - 15  Lactic acid, plasma  Result Value Ref Range   Lactic Acid, Venous 1.5 0.5 - 1.9 mmol/L  CBC with Differential  Result Value Ref Range   WBC 3.8 (L) 4.0 - 10.5 K/uL   RBC 3.91 3.87 - 5.11 MIL/uL   Hemoglobin 13.0 12.0 - 15.0 g/dL   HCT 37.6 36 - 46 %   MCV 96.2 80.0 - 100.0 fL   MCH 33.2 26.0 - 34.0 pg   MCHC 34.6 30.0 - 36.0 g/dL   RDW 13.2 11.5 - 15.5 %   Platelets 152 150 - 400 K/uL   nRBC 0.0 0.0 - 0.2 %   Neutrophils Relative % 63 %   Neutro Abs 2.5 1.7 - 7.7 K/uL   Lymphocytes Relative 28 %   Lymphs Abs 1.1 0.7 - 4.0 K/uL   Monocytes Relative 6 %   Monocytes Absolute 0.2 0.1 - 1.0 K/uL   Eosinophils Relative 1 %   Eosinophils Absolute 0.0 0.0 - 0.5 K/uL   Basophils Relative 1 %   Basophils Absolute 0.0 0.0 - 0.1 K/uL   Immature Granulocytes 1 %   Abs Immature Granulocytes 0.02 0.00 - 0.07 K/uL  Protime-INR  Result Value Ref Range   Prothrombin Time 12.2 11.4 - 15.2 seconds   INR 0.9 0.8 - 1.2  Fibrin derivatives D-Dimer ([facility] only)  Result Value Ref Range   Fibrin derivatives D-dimer ([facility]) 9,518.84 (H) 0.00 -  499.00 ng/mL (FEU)  C-reactive protein  Result Value Ref Range   CRP 1.0 (H) <1.0 mg/dL  Procalcitonin -  Baseline  Result Value Ref Range   Procalcitonin <0.10 ng/mL  CBC with Differential/Platelet  Result Value Ref Range   WBC 1.8 (L) 4.0 - 10.5 K/uL   RBC 3.64 (L) 3.87 - 5.11 MIL/uL   Hemoglobin 12.0 12.0 - 15.0 g/dL   HCT 35.1 (L) 36 - 46 %   MCV 96.4 80.0 - 100.0 fL   MCH 33.0 26.0 - 34.0 pg   MCHC 34.2 30.0 - 36.0 g/dL   RDW 13.2 11.5 - 15.5 %   Platelets 147 (L) 150 - 400 K/uL   nRBC 0.0 0.0 - 0.2 %   Neutrophils Relative % 62 %   Neutro Abs 1.1 (L) 1.7 - 7.7 K/uL   Lymphocytes Relative 30 %   Lymphs Abs 0.5 (L) 0.7 - 4.0 K/uL   Monocytes Relative 6 %   Monocytes Absolute 0.1 0.1 - 1.0 K/uL   Eosinophils Relative 0 %   Eosinophils Absolute 0.0 0.0 - 0.5 K/uL   Basophils Relative 1 %   Basophils Absolute 0.0 0.0 - 0.1 K/uL   Immature Granulocytes 1 %   Abs Immature Granulocytes 0.02 0.00 - 0.07 K/uL  Comprehensive metabolic panel  Result Value Ref Range   Sodium 140 135 - 145 mmol/L   Potassium 4.3 3.5 - 5.1 mmol/L   Chloride 109 98 - 111 mmol/L   CO2 23 22 - 32 mmol/L   Glucose, Bld 163 (H) 70 - 99 mg/dL   BUN 22 8 - 23 mg/dL   Creatinine, Ser 1.49 (H) 0.44 - 1.00 mg/dL   Calcium 8.5 (L) 8.9 - 10.3 mg/dL   Total Protein 7.0 6.5 - 8.1 g/dL   Albumin 3.3 (L) 3.5 - 5.0 g/dL   AST 27 15 - 41 U/L   ALT 16 0 - 44 U/L   Alkaline Phosphatase 65 38 - 126 U/L   Total Bilirubin 0.5 0.3 - 1.2 mg/dL   GFR, Estimated 33 (L) >60 mL/min   Anion gap 8 5 - 15  C-reactive protein  Result Value Ref Range   CRP 3.5 (H) <1.0 mg/dL  Ferritin  Result Value Ref Range   Ferritin 216 11 - 307 ng/mL  Fibrin derivatives D-Dimer ([facility] only)  Result Value Ref Range   Fibrin derivatives D-dimer ([facility]) 2,317.32 (H) 0.00 - 499.00 ng/mL (FEU)  CBC with Differential/Platelet  Result Value Ref Range   WBC 5.8 4.0 - 10.5 K/uL   RBC 3.75 (L) 3.87 - 5.11 MIL/uL   Hemoglobin 12.3 12.0 - 15.0 g/dL   HCT 36.1 36 - 46 %   MCV 96.3 80.0 - 100.0 fL   MCH 32.8 26.0 - 34.0 pg   MCHC 34.1 30.0 - 36.0  g/dL   RDW 13.5 11.5 - 15.5 %   Platelets 168 150 - 400 K/uL   nRBC 0.0 0.0 - 0.2 %   Neutrophils Relative % 60 %   Neutro Abs 3.5 1.7 - 7.7 K/uL   Lymphocytes Relative 32 %   Lymphs Abs 1.9 0.7 - 4.0 K/uL   Monocytes Relative 8 %   Monocytes Absolute 0.5 0.1 - 1.0 K/uL   Eosinophils Relative 0 %   Eosinophils Absolute 0.0 0.0 - 0.5 K/uL   Basophils Relative 0 %   Basophils Absolute 0.0 0.0 - 0.1 K/uL   Immature Granulocytes 0 %   Abs Immature Granulocytes 0.01 0.00 -  0.07 K/uL  Comprehensive metabolic panel  Result Value Ref Range   Sodium 137 135 - 145 mmol/L   Potassium 4.2 3.5 - 5.1 mmol/L   Chloride 107 98 - 111 mmol/L   CO2 23 22 - 32 mmol/L   Glucose, Bld 94 70 - 99 mg/dL   BUN 34 (H) 8 - 23 mg/dL   Creatinine, Ser 1.47 (H) 0.44 - 1.00 mg/dL   Calcium 8.8 (L) 8.9 - 10.3 mg/dL   Total Protein 6.7 6.5 - 8.1 g/dL   Albumin 3.2 (L) 3.5 - 5.0 g/dL   AST 31 15 - 41 U/L   ALT 17 0 - 44 U/L   Alkaline Phosphatase 70 38 - 126 U/L   Total Bilirubin 0.6 0.3 - 1.2 mg/dL   GFR, Estimated 33 (L) >60 mL/min   Anion gap 7 5 - 15  C-reactive protein  Result Value Ref Range   CRP 1.0 (H) <1.0 mg/dL  Fibrin derivatives D-Dimer ([facility] only)  Result Value Ref Range   Fibrin derivatives D-dimer ([facility]) 1,541.06 (H) 0.00 - 499.00 ng/mL (FEU)       Assessment & Plan:     ICD-10-CM   1. Bronchitis  J40 predniSONE (DELTASONE) 20 MG tablet    azithromycin (ZITHROMAX Z-PAK) 250 MG tablet    benzonatate (TESSALON) 100 MG capsule    promethazine (PHENERGAN) 6.25 MG/5ML syrup    DG Chest 2 View   tx in hospital with only remdesmivir, at that time no other tx, clinical very suspicious for acute bronchitis - which she has hx of  2. Pneumonia due to COVID-19 virus  U07.1 DG Chest 2 View   J12.82    continued sx primarily involve fatigue, cough, SOB, no hypoxia, discussed extensively with pt concerning signs and sx get immediate eval for  3. Abnormal CXR  R93.89 DG Chest 2 View   repeat CXR  in 4 weeks  4. Encounter for examination following treatment at hospital  Z09    14 d TOC f/up from COVID pneumonia with hypoxia admission, no hypoxia, f/up in office in one month      -Red flags and when to present for emergency care or RTC including fever >101.79F, chest pain, shortness of breath, new/worsening/un-resolving symptoms, reviewed with patient at time of visit. Follow up and care instructions discussed and provided in AVS. - I discussed the assessment and treatment plan with the patient. The patient was provided an opportunity to ask questions and all were answered. The patient agreed with the plan and demonstrated an understanding of the instructions.  I provided 40+ minutes of non-face-to-face time during this encounter.  Delsa Grana, PA-C 04/02/20 6:46 PM   Delsa Grana, PA-C 04/02/20 1:35 PM

## 2020-04-02 NOTE — Progress Notes (Deleted)
Name: Christine Burnett   MRN: [id number]    DOB: [birth year]   Date:04/02/2020       Progress Note  Subjective:    Chief Complaint  Chief Complaint  Patient presents with  . Hospitalization Follow-up  . Cough     HPI   Patient Active Problem List   Diagnosis Date Noted  . Pneumonia due to COVID-19 virus 03/17/2020  . Hypoxia 03/17/2020  . Prediabetes 08/13/2018  . Major depressive disorder, recurrent ([place]) 06/13/2018  . Allergic rhinitis 09/27/2017  . 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 Chronic kidney disease, stage 3b ([place]) 08/31/2017  . 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 Osteoporosis without current pathological fracture 03/30/2017  . Cardiac murmur 04/07/2015  . 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 Anxiety 01/05/2015  . Hyperlipidemia 01/05/2015  . HTN (hypertension) 09/24/2014    Social History   Tobacco Use  . Smoking status: Former Smoker    Packs/day: 0.25    Years: 61.00    Pack years: 15.25    Types: Cigarettes    Quit date: 03/25/2017    Years since quitting: 3.0  . Smokeless tobacco: Never Used  . Tobacco comment: smoking cessation materials not required  Substance Use Topics  . Alcohol use: No     Current Outpatient Medications:  .  acetaminophen (TYLENOL) 650 MG CR tablet, Take 1 tablet (650 mg total) by mouth 4 (four) times daily as needed for pain., Disp: , Rfl:  .  albuterol (VENTOLIN HFA) 108 (90 Base) MCG/ACT inhaler, Inhale 2 puffs into the lungs every 6 (six) hours as needed for wheezing or shortness of breath., Disp: 1 each, Rfl: 0 .  aspirin 81 MG EC tablet, Take 81 mg by mouth daily. Swallow whole., Disp: , Rfl:  .  atorvastatin (LIPITOR) 10 MG tablet, Take 1 tablet (10 mg total) by mouth at bedtime., Disp: 90 tablet, Rfl: 3 .  Cholecalciferol (VITAMIN D-3) 1000 units CAPS, Take 2 capsules by mouth daily.  Takes occassionally, Disp: , Rfl:  .  hydrOXYzine (ATARAX/VISTARIL) 10 MG tablet, Take 1 tablet (10 mg total) by mouth every 8 (eight) hours as needed for anxiety., Disp: 60 tablet, Rfl: 3 .  melatonin 5 MG TABS, Take 5 mg by mouth at bedtime. Pt taking 2 tabs qhs, Disp: , Rfl:  .  PARoxetine (PAXIL) 40 MG tablet, Take 1 tablet (40 mg total) by mouth every morning., Disp: 90 tablet, Rfl: 3 .  Probiotic Product (PROBIOTIC-10 PO), Take by mouth., Disp: , Rfl:  .  azithromycin (ZITHROMAX Z-PAK) 250 MG tablet, Take 1 tablet (250 mg total) by mouth daily. 500mg  PO day 1, then 250mg  PO days 205, Disp: 6 tablet, Rfl: 0 .  benzonatate (TESSALON) 100 MG capsule, Take 1 capsule (100 mg total) by mouth 3 (three) times daily as needed for cough., Disp: 30 capsule, Rfl: 0 .  Cyanocobalamin (VITAMIN B-12) 500 MCG SUBL, One tablet dissolved under the tongue once a day (Patient not taking: Reported on 03/17/2020), Disp: 100 tablet, Rfl: 3 .  predniSONE (DELTASONE) 20 MG tablet, Take 2 tablets (40 mg total) by mouth daily with breakfast for 5 days., Disp: 10 tablet, Rfl: 0 .  promethazine (PHENERGAN) 6.25 MG/5ML syrup, Take 5 mLs (6.25 mg total) by mouth every 6 (six) hours as needed for nausea or vomiting., Disp: 120 mL, Rfl: 0  Allergies  Allergen Reactions  . Cefuroxime  Axetil     caused rash.  . Ciprofloxacin     Unknown  . Codeine     ***  Review of Systems    Objective:   Virtual encounter, vitals limited, only able to obtain the following Today's Vitals   04/02/20 1307  BP: 90/60  Pulse: 98  Temp: 98.2 F (36.8 C)  TempSrc: Oral  SpO2: 95%   There is no height or weight on file to calculate BMI. Nursing Note and Vital Signs reviewed.  Physical Exam  PE limited by telephone encounter  No results found for this or any previous visit (from the past 72 hour(s)).  Assessment and Plan:   No diagnosis found.   -Red flags and when to present for emergency care or RTC including fever  >101.16F, chest pain, shortness of breath, new/worsening/un-resolving symptoms, reviewed with patient at time of visit. Follow up and care instructions discussed and provided in AVS. - I discussed the assessment and treatment plan with the patient. The patient was provided an opportunity to ask questions and all were answered. The patient agreed with the plan and demonstrated an understanding of the instructions.  I provided *** minutes of non-face-to-face time during this encounter.  Delsa Grana, PA-C 04/02/20 6:46 PM

## 2020-04-09 ENCOUNTER — Telehealth: Payer: Medicare Other | Admitting: Family Medicine

## 2020-04-19 DIAGNOSIS — K5790 Diverticulosis of intestine, part unspecified, without perforation or abscess without bleeding: Secondary | ICD-10-CM | POA: Diagnosis not present

## 2020-04-19 DIAGNOSIS — N2581 Secondary hyperparathyroidism of renal origin: Secondary | ICD-10-CM | POA: Diagnosis not present

## 2020-04-19 DIAGNOSIS — Z87891 Personal history of nicotine dependence: Secondary | ICD-10-CM | POA: Diagnosis not present

## 2020-04-19 DIAGNOSIS — H179 Unspecified corneal scar and opacity: Secondary | ICD-10-CM | POA: Diagnosis not present

## 2020-04-19 DIAGNOSIS — Z8673 Personal history of transient ischemic attack (TIA), and cerebral infarction without residual deficits: Secondary | ICD-10-CM | POA: Diagnosis not present

## 2020-04-19 DIAGNOSIS — J42 Unspecified chronic bronchitis: Secondary | ICD-10-CM | POA: Diagnosis not present

## 2020-04-19 DIAGNOSIS — M810 Age-related osteoporosis without current pathological fracture: Secondary | ICD-10-CM | POA: Diagnosis not present

## 2020-04-19 DIAGNOSIS — J1282 Pneumonia due to coronavirus disease 2019: Secondary | ICD-10-CM | POA: Diagnosis not present

## 2020-04-19 DIAGNOSIS — F419 Anxiety disorder, unspecified: Secondary | ICD-10-CM | POA: Diagnosis not present

## 2020-04-19 DIAGNOSIS — Z8781 Personal history of (healed) traumatic fracture: Secondary | ICD-10-CM | POA: Diagnosis not present

## 2020-04-19 DIAGNOSIS — R918 Other nonspecific abnormal finding of lung field: Secondary | ICD-10-CM | POA: Diagnosis not present

## 2020-04-19 DIAGNOSIS — I129 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 stage 4 chronic kidney disease, or unspecified chronic kidney disease: Secondary | ICD-10-CM | POA: Diagnosis not present

## 2020-04-19 DIAGNOSIS — Z7982 Long term (current) use of aspirin: Secondary | ICD-10-CM | POA: Diagnosis not present

## 2020-04-19 DIAGNOSIS — J302 Other seasonal allergic rhinitis: Secondary | ICD-10-CM | POA: Diagnosis not present

## 2020-04-19 DIAGNOSIS — Z9181 History of falling: Secondary | ICD-10-CM | POA: Diagnosis not present

## 2020-04-19 DIAGNOSIS — I4891 Unspecified atrial fibrillation: Secondary | ICD-10-CM | POA: Diagnosis not present

## 2020-04-19 DIAGNOSIS — F32A Depression, unspecified: Secondary | ICD-10-CM | POA: Diagnosis not present

## 2020-04-19 DIAGNOSIS — E785 Hyperlipidemia, unspecified: Secondary | ICD-10-CM | POA: Diagnosis not present

## 2020-04-19 DIAGNOSIS — U071 COVID-19: Secondary | ICD-10-CM | POA: Diagnosis not present

## 2020-04-19 DIAGNOSIS — N1832 Chronic kidney disease, stage 3b: Secondary | ICD-10-CM | POA: Diagnosis not present

## 2020-04-21 ENCOUNTER — Telehealth: Payer: Self-pay

## 2020-04-21 NOTE — Telephone Encounter (Signed)
Copied from [place] [id number]. Topic: General - Other >> Apr 21, 2020  4:20 PM Leward Quan A wrote: Reason for CRM: Merry Proud PT with [facility] called to inform Delsa Grana that when he was in the patient home the patient complained about feeling off with some dizziness and being lightheaded  so he checked her BP and they read 116/66 sitting,  82/60 standing with light headedness, laying down 126/68,  had her drink some water and laying 120/66 // 120/66 Sitting up // and standing 110/62 no symptoms after the water and laying down for a little while. Any questions please call Ph# [phone]

## 2020-04-22 NOTE — Telephone Encounter (Signed)
Patient notified and stated she feel much better today

## 2020-05-03 ENCOUNTER — Ambulatory Visit
Admission: RE | Admit: 2020-05-03 | Discharge: 2020-05-03 | Disposition: A | Discharge status: Home / Self Care | Payer: Medicare Other | Source: Ambulatory Visit | Attending: Family Medicine | Admitting: Family Medicine

## 2020-05-03 ENCOUNTER — Ambulatory Visit
Admission: RE | Admit: 2020-05-03 | Discharge: 2020-05-03 | Disposition: A | Discharge status: Home / Self Care | Payer: Medicare Other | Attending: Family Medicine | Admitting: Family Medicine

## 2020-05-03 ENCOUNTER — Encounter: Payer: Self-pay | Admitting: Family Medicine

## 2020-05-03 ENCOUNTER — Other Ambulatory Visit: Payer: Self-pay

## 2020-05-03 ENCOUNTER — Ambulatory Visit (INDEPENDENT_AMBULATORY_CARE_PROVIDER_SITE_OTHER): Payer: Medicare Other | Admitting: Family Medicine

## 2020-05-03 VITALS — BP 120/80 | HR 70 | Temp 98.1°F | Resp 16 | Ht 67.0 in | Wt 148.3 lb

## 2020-05-03 DIAGNOSIS — E559 Vitamin D deficiency, unspecified: Secondary | ICD-10-CM

## 2020-05-03 DIAGNOSIS — U071 COVID-19: Secondary | ICD-10-CM

## 2020-05-03 DIAGNOSIS — L304 Erythema intertrigo: Secondary | ICD-10-CM | POA: Diagnosis not present

## 2020-05-03 DIAGNOSIS — N2581 Secondary hyperparathyroidism of renal origin: Secondary | ICD-10-CM

## 2020-05-03 DIAGNOSIS — J1282 Pneumonia due to coronavirus disease 2019: Secondary | ICD-10-CM

## 2020-05-03 DIAGNOSIS — I10 Essential (primary) hypertension: Secondary | ICD-10-CM

## 2020-05-03 DIAGNOSIS — R9389 Abnormal findings on diagnostic imaging of other specified body structures: Secondary | ICD-10-CM | POA: Insufficient documentation

## 2020-05-03 DIAGNOSIS — Z5181 Encounter for therapeutic drug level monitoring: Secondary | ICD-10-CM | POA: Diagnosis not present

## 2020-05-03 DIAGNOSIS — J40 Bronchitis, not specified as acute or chronic: Secondary | ICD-10-CM

## 2020-05-03 DIAGNOSIS — M810 Age-related osteoporosis without current pathological fracture: Secondary | ICD-10-CM

## 2020-05-03 DIAGNOSIS — R918 Other nonspecific abnormal finding of lung field: Secondary | ICD-10-CM | POA: Diagnosis not present

## 2020-05-03 DIAGNOSIS — N1832 Chronic kidney disease, stage 3b: Secondary | ICD-10-CM

## 2020-05-03 MED ORDER — CLOTRIMAZOLE-BETAMETHASONE 1-0.05 % EX CREA: 1.0000 "application " | TOPICAL_CREAM | Freq: Two times a day (BID) | CUTANEOUS | 1 refills | Status: AC

## 2020-05-03 MED ORDER — NYSTATIN 100000 UNIT/GM EX POWD: 1.0000 "application " | Freq: Three times a day (TID) | CUTANEOUS | 1 refills | Status: AC

## 2020-05-03 NOTE — Progress Notes (Signed)
Patient ID: Christine Burnett, female    DOB: 10/02/1940, 79 y.o.   MRN: [id number]  PCP: Delsa Grana, PA-C  Chief Complaint  Patient presents with  . Follow-up    Subjective:   Christine Burnett is a 79 y.o. female, presents to clinic with CC of the following:  HPI  F/up in clinic after COVID and hospitaliation in Oct She is still having some fatigue and DOE She is due for f/up CXR - previously ordered to f/up on infiltrate vs mass found in 10/13 CXR  Due for f/up labs and monitoring renal function  CKD stage 3b, little decrease while in hospital, pt has been pushing fluids Last saw nephrology in March 2021 - doesn't really want to go back to specialists  Hypotension following COVID: Bp was low at home, appetite better, drinking more water and fluids Not on HTN meds, BP much better today, less lightheaded episodes BP Readings from Last 3 Encounters:  05/03/20 120/80  04/02/20 90/60  03/21/20 101/72   Pt denies CP, LE edema, palpitation,  lightheadedness, hypotension, syncope.  Nephrology was monitoring phosphorus, Vit d, anemia, secondary hyperparathyroid  Moods:  On paxil 40 mg Pt feels moods are good, all things considered. Depression screen [facility] 2/9 05/03/2020 05/03/2020 04/02/2020  Decreased Interest 0 0 0  Down, Depressed, Hopeless 0 0 0  PHQ - 2 Score 0 0 0  Altered sleeping 0 - -  Tired, decreased energy 1 - -  Change in appetite 0 - -  Feeling bad or failure about yourself  0 - -  Trouble concentrating 0 - -  Moving slowly or fidgety/restless 0 - -  Suicidal thoughts 0 - -  PHQ-9 Score 1 - -  Difficult doing work/chores Not difficult at all - -  Some recent data might be hidden      Patient Active Problem List   Diagnosis Date Noted  . Pneumonia due to COVID-19 virus 03/17/2020  . Hypoxia 03/17/2020  . Prediabetes 08/13/2018  . Major depressive disorder, recurrent ([place]) 06/13/2018  . Allergic rhinitis 09/27/2017  . DD (diverticular disease)  09/27/2017  . Chronic kidney disease, stage 3b ([place]) 08/31/2017  . Hyperparathyroidism, secondary renal ([place]) 08/31/2017  . Osteoporosis without current pathological fracture 03/30/2017  . Cardiac murmur 04/07/2015  . 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 Anxiety 01/05/2015  . Hyperlipidemia 01/05/2015  . HTN (hypertension) 09/24/2014      Current Outpatient Medications:  .  acetaminophen (TYLENOL) 650 MG CR tablet, Take 1 tablet (650 mg total) by mouth 4 (four) times daily as needed for pain., Disp: , Rfl:  .  albuterol (VENTOLIN HFA) 108 (90 Base) MCG/ACT inhaler, Inhale 2 puffs into the lungs every 6 (six) hours as needed for wheezing or shortness of breath., Disp: 1 each, Rfl: 0 .  aspirin 81 MG EC tablet, Take 81 mg by mouth daily. Swallow whole., Disp: , Rfl:  .  atorvastatin (LIPITOR) 10 MG tablet, Take 1 tablet (10 mg total) by mouth at bedtime., Disp: 90 tablet, Rfl: 3 .  azithromycin (ZITHROMAX Z-PAK) 250 MG tablet, Take 1 tablet (250 mg total) by mouth daily. 500mg  PO day 1, then 250mg  PO days 205, Disp: 6 tablet, Rfl: 0 .  benzonatate (TESSALON) 100 MG capsule, Take 1 capsule (100 mg total) by mouth 3 (three) times daily as needed for cough., Disp: 30 capsule, Rfl: 0 .  Cholecalciferol (  VITAMIN D-3) 1000 units CAPS, Take 2 capsules by mouth daily. Takes occassionally, Disp: , Rfl:  .  Cyanocobalamin (VITAMIN B-12) 500 MCG SUBL, One tablet dissolved under the tongue once a day (Patient not taking: Reported on 03/17/2020), Disp: 100 tablet, Rfl: 3 .  hydrOXYzine (ATARAX/VISTARIL) 10 MG tablet, Take 1 tablet (10 mg total) by mouth every 8 (eight) hours as needed for anxiety., Disp: 60 tablet, Rfl: 3 .  melatonin 5 MG TABS, Take 5 mg by mouth at bedtime. Pt taking 2 tabs qhs, Disp: , Rfl:  .  PARoxetine (PAXIL) 40 MG tablet, Take 1 tablet (40 mg total) by mouth every morning., Disp:  90 tablet, Rfl: 3 .  Probiotic Product (PROBIOTIC-10 PO), Take by mouth., Disp: , Rfl:  .  promethazine (PHENERGAN) 6.25 MG/5ML syrup, Take 5 mLs (6.25 mg total) by mouth every 6 (six) hours as needed for nausea or vomiting., Disp: 120 mL, Rfl: 0   Allergies  Allergen Reactions  . Cefuroxime Axetil     caused rash.  . Ciprofloxacin     Unknown  . Codeine      Social History   Tobacco Use  . Smoking status: Former Smoker    Packs/day: 0.25    Years: 61.00    Pack years: 15.25    Types: Cigarettes    Quit date: 03/25/2017    Years since quitting: 3.1  . Smokeless tobacco: Never Used  . Tobacco comment: smoking cessation materials not required  Vaping Use  . Vaping Use: Never used  Substance Use Topics  . Alcohol use: No  . Drug use: No      Chart Review Today: I personally reviewed active problem list, medication list, allergies, family history, social history, health maintenance, notes from last encounter, lab results, imaging with the patient/caregiver today.   Review of Systems 10 Systems reviewed and are negative for acute change except as noted in the HPI.     Objective:   Vitals:   05/03/20 1312  BP: 120/80  Pulse: 70  Resp: 16  Temp: 98.1 F (36.7 C)  TempSrc: Oral  SpO2: 96%  Weight: 148 lb 4.8 oz (67.3 kg)  Height: 5\' 7"  (1.702 m)    Body mass index is 23.23 kg/m.  Physical Exam Vitals and nursing note reviewed.  Constitutional:      General: She is not in acute distress.    Appearance: Normal appearance. She is well-developed. She is not ill-appearing, toxic-appearing or diaphoretic.     Interventions: Face mask in place.  HENT:     Head: Normocephalic and atraumatic.     Right Ear: External ear normal.     Left Ear: External ear normal.  Eyes:     General: Lids are normal. No scleral icterus.       Right eye: No discharge.        Left eye: No discharge.  Neck:     Trachea: Phonation normal. No tracheal deviation.  Cardiovascular:      Rate and Rhythm: Normal rate and regular rhythm.     Pulses: Normal pulses.          Radial pulses are 2+ on the right side and 2+ on the left side.       Posterior tibial pulses are 2+ on the right side and 2+ on the left side.     Heart sounds: Normal heart sounds. No murmur heard.  No friction rub. No gallop.   Pulmonary:     Effort:  Pulmonary effort is normal. No respiratory distress.     Breath sounds: Normal breath sounds. No stridor. No wheezing, rhonchi or rales.  Chest:     Chest wall: No tenderness.  Abdominal:     General: Bowel sounds are normal. There is no distension.     Palpations: Abdomen is soft.  Musculoskeletal:     Right lower leg: No edema.     Left lower leg: No edema.  Skin:    General: Skin is warm and dry.     Coloration: Skin is not jaundiced or pale.     Findings: No rash.  Neurological:     Mental Status: She is alert.     Motor: No abnormal muscle tone.     Gait: Gait normal.  Psychiatric:        Mood and Affect: Mood normal.        Speech: Speech normal.        Behavior: Behavior normal.      Results for orders placed or performed during the hospital encounter of 03/17/20  Culture, blood (Routine x 2)   Specimen: BLOOD  Result Value Ref Range   Specimen Description BLOOD RIGHT ANTECUBITAL    Special Requests      BOTTLES DRAWN AEROBIC AND ANAEROBIC Blood Culture adequate volume   Culture      NO GROWTH 5 DAYS Performed at [facility], [address] [place], [place] [address]    Report Status 03/22/2020 FINAL   Respiratory Panel by RT PCR (Flu A&B, Covid) - Nasopharyngeal Swab   Specimen: Nasopharyngeal Swab  Result Value Ref Range   SARS Coronavirus 2 by RT PCR POSITIVE (A) NEGATIVE   Influenza A by PCR NEGATIVE NEGATIVE   Influenza B by PCR NEGATIVE NEGATIVE  Culture, blood (Routine X 2) w Reflex to ID Panel   Specimen: BLOOD  Result Value Ref Range   Specimen Description BLOOD RIGHT ARM    Special Requests       BOTTLES DRAWN AEROBIC AND ANAEROBIC Blood Culture results may not be optimal due to an excessive volume of blood received in culture bottles   Culture      NO GROWTH 5 DAYS Performed at [facility], [address] [place], [place] [address]    Report Status 03/23/2020 FINAL   Comprehensive metabolic panel  Result Value Ref Range   Sodium 136 135 - 145 mmol/L   Potassium 4.1 3.5 - 5.1 mmol/L   Chloride 103 98 - 111 mmol/L   CO2 24 22 - 32 mmol/L   Glucose, Bld 116 (H) 70 - 99 mg/dL   BUN 22 8 - 23 mg/dL   Creatinine, Ser 1.67 (H) 0.44 - 1.00 mg/dL   Calcium 9.1 8.9 - 10.3 mg/dL   Total Protein 7.8 6.5 - 8.1 g/dL   Albumin 3.9 3.5 - 5.0 g/dL   AST 29 15 - 41 U/L   ALT 17 0 - 44 U/L   Alkaline Phosphatase 75 38 - 126 U/L   Total Bilirubin 0.7 0.3 - 1.2 mg/dL   GFR, Estimated 29 (L) >60 mL/min   Anion gap 9 5 - 15  Lactic acid, plasma  Result Value Ref Range   Lactic Acid, Venous 1.5 0.5 - 1.9 mmol/L  CBC with Differential  Result Value Ref Range   WBC 3.8 (L) 4.0 - 10.5 K/uL   RBC 3.91 3.87 - 5.11 MIL/uL   Hemoglobin 13.0 12.0 - 15.0 g/dL   HCT 37.6 36 -  46 %   MCV 96.2 80.0 - 100.0 fL   MCH 33.2 26.0 - 34.0 pg   MCHC 34.6 30.0 - 36.0 g/dL   RDW 13.2 11.5 - 15.5 %   Platelets 152 150 - 400 K/uL   nRBC 0.0 0.0 - 0.2 %   Neutrophils Relative % 63 %   Neutro Abs 2.5 1.7 - 7.7 K/uL   Lymphocytes Relative 28 %   Lymphs Abs 1.1 0.7 - 4.0 K/uL   Monocytes Relative 6 %   Monocytes Absolute 0.2 0.1 - 1.0 K/uL   Eosinophils Relative 1 %   Eosinophils Absolute 0.0 0.0 - 0.5 K/uL   Basophils Relative 1 %   Basophils Absolute 0.0 0.0 - 0.1 K/uL   Immature Granulocytes 1 %   Abs Immature Granulocytes 0.02 0.00 - 0.07 K/uL  Protime-INR  Result Value Ref Range   Prothrombin Time 12.2 11.4 - 15.2 seconds   INR 0.9 0.8 - 1.2  Fibrin derivatives D-Dimer ([facility] only)  Result Value Ref Range   Fibrin derivatives D-dimer ([facility]) 2,779.32 (H) 0.00 - 499.00 ng/mL (FEU)    C-reactive protein  Result Value Ref Range   CRP 1.0 (H) <1.0 mg/dL  Procalcitonin - Baseline  Result Value Ref Range   Procalcitonin <0.10 ng/mL  CBC with Differential/Platelet  Result Value Ref Range   WBC 1.8 (L) 4.0 - 10.5 K/uL   RBC 3.64 (L) 3.87 - 5.11 MIL/uL   Hemoglobin 12.0 12.0 - 15.0 g/dL   HCT 35.1 (L) 36 - 46 %   MCV 96.4 80.0 - 100.0 fL   MCH 33.0 26.0 - 34.0 pg   MCHC 34.2 30.0 - 36.0 g/dL   RDW 13.2 11.5 - 15.5 %   Platelets 147 (L) 150 - 400 K/uL   nRBC 0.0 0.0 - 0.2 %   Neutrophils Relative % 62 %   Neutro Abs 1.1 (L) 1.7 - 7.7 K/uL   Lymphocytes Relative 30 %   Lymphs Abs 0.5 (L) 0.7 - 4.0 K/uL   Monocytes Relative 6 %   Monocytes Absolute 0.1 0.1 - 1.0 K/uL   Eosinophils Relative 0 %   Eosinophils Absolute 0.0 0.0 - 0.5 K/uL   Basophils Relative 1 %   Basophils Absolute 0.0 0.0 - 0.1 K/uL   Immature Granulocytes 1 %   Abs Immature Granulocytes 0.02 0.00 - 0.07 K/uL  Comprehensive metabolic panel  Result Value Ref Range   Sodium 140 135 - 145 mmol/L   Potassium 4.3 3.5 - 5.1 mmol/L   Chloride 109 98 - 111 mmol/L   CO2 23 22 - 32 mmol/L   Glucose, Bld 163 (H) 70 - 99 mg/dL   BUN 22 8 - 23 mg/dL   Creatinine, Ser 1.49 (H) 0.44 - 1.00 mg/dL   Calcium 8.5 (L) 8.9 - 10.3 mg/dL   Total Protein 7.0 6.5 - 8.1 g/dL   Albumin 3.3 (L) 3.5 - 5.0 g/dL   AST 27 15 - 41 U/L   ALT 16 0 - 44 U/L   Alkaline Phosphatase 65 38 - 126 U/L   Total Bilirubin 0.5 0.3 - 1.2 mg/dL   GFR, Estimated 33 (L) >60 mL/min   Anion gap 8 5 - 15  C-reactive protein  Result Value Ref Range   CRP 3.5 (H) <1.0 mg/dL  Ferritin  Result Value Ref Range   Ferritin 216 11 - 307 ng/mL  Fibrin derivatives D-Dimer ([facility] only)  Result Value Ref Range   Fibrin derivatives D-dimer [facility])  2,317.32 (H) 0.00 - 499.00 ng/mL (FEU)  CBC with Differential/Platelet  Result Value Ref Range   WBC 5.8 4.0 - 10.5 K/uL   RBC 3.75 (L) 3.87 - 5.11 MIL/uL   Hemoglobin 12.3 12.0 - 15.0 g/dL   HCT 36.1  36 - 46 %   MCV 96.3 80.0 - 100.0 fL   MCH 32.8 26.0 - 34.0 pg   MCHC 34.1 30.0 - 36.0 g/dL   RDW 13.5 11.5 - 15.5 %   Platelets 168 150 - 400 K/uL   nRBC 0.0 0.0 - 0.2 %   Neutrophils Relative % 60 %   Neutro Abs 3.5 1.7 - 7.7 K/uL   Lymphocytes Relative 32 %   Lymphs Abs 1.9 0.7 - 4.0 K/uL   Monocytes Relative 8 %   Monocytes Absolute 0.5 0.1 - 1.0 K/uL   Eosinophils Relative 0 %   Eosinophils Absolute 0.0 0.0 - 0.5 K/uL   Basophils Relative 0 %   Basophils Absolute 0.0 0.0 - 0.1 K/uL   Immature Granulocytes 0 %   Abs Immature Granulocytes 0.01 0.00 - 0.07 K/uL  Comprehensive metabolic panel  Result Value Ref Range   Sodium 137 135 - 145 mmol/L   Potassium 4.2 3.5 - 5.1 mmol/L   Chloride 107 98 - 111 mmol/L   CO2 23 22 - 32 mmol/L   Glucose, Bld 94 70 - 99 mg/dL   BUN 34 (H) 8 - 23 mg/dL   Creatinine, Ser 1.47 (H) 0.44 - 1.00 mg/dL   Calcium 8.8 (L) 8.9 - 10.3 mg/dL   Total Protein 6.7 6.5 - 8.1 g/dL   Albumin 3.2 (L) 3.5 - 5.0 g/dL   AST 31 15 - 41 U/L   ALT 17 0 - 44 U/L   Alkaline Phosphatase 70 38 - 126 U/L   Total Bilirubin 0.6 0.3 - 1.2 mg/dL   GFR, Estimated 33 (L) >60 mL/min   Anion gap 7 5 - 15  C-reactive protein  Result Value Ref Range   CRP 1.0 (H) <1.0 mg/dL  Fibrin derivatives D-Dimer ([facility] only)  Result Value Ref Range   Fibrin derivatives D-dimer ([facility]) 1,541.06 (H) 0.00 - 499.00 ng/mL (FEU)       Assessment & Plan:     ICD-10-CM   1. Stage 3b chronic kidney disease (HCC)  N18.32 CBC with Differential/Platelet    COMPLETE METABOLIC PANEL WITH GFR    Parathyroid hormone, intact (no Ca)    Phosphorus    Vitamin D, 25-OH,Total,IA(Refl)   pushing fluids, BP optimal today, recheck labs, pt instructed to f/up with nephrology, will need sooner f/up if GFR is lower than 30  2. Hyperparathyroidism, secondary renal (HCC)  K99.83 COMPLETE METABOLIC PANEL WITH GFR    Parathyroid hormone, intact (no Ca)    Vitamin D, 25-OH,Total,IA(Refl)  3. Essential  hypertension  J82 COMPLETE METABOLIC PANEL WITH GFR   pt was hypotensive after covid, pushing fluids, sx improved, BP within normal limits today, much better  4. Osteoporosis without current pathological fracture, unspecified osteoporosis type  N05.3 COMPLETE METABOLIC PANEL WITH GFR    Vitamin D, 25-OH,Total,IA(Refl)   on supplements, screen labs  5. Vitamin D deficiency  Z76.7 COMPLETE METABOLIC PANEL WITH GFR    Vitamin D, 25-OH,Total,IA(Refl)  6. Intertrigo  L30.4 nystatin (MYCOSTATIN/NYSTOP) powder    clotrimazole-betamethasone (LOTRISONE) cream   trial of topical clotrimazole-betamethasone cream to affected area - keep skin dry, avoid friction, may need nystatin powder or oral diflucan if resistant  7. Bronchitis  J40    sx improved with zpak, steroids and cough meds, no longer coughing, SOB much better  8. Pneumonia due to COVID-19 virus  U07.1    J12.82    improved - due for f/up CXR  9. Abnormal CXR  R93.89    repeat CXR now due - previously ordered, pt's lung CTA A&P and respiratory sx are improving with only mild DOE and fatigue with activity lingering  10. Encounter for medication monitoring  Z51.81 CBC with Differential/Platelet    COMPLETE METABOLIC PANEL WITH GFR    Parathyroid hormone, intact (no Ca)    Phosphorus    Vitamin D, 25-OH,Total,IA(Refl)        Delsa Grana, PA-C 05/03/20 1:03 PM

## 2020-05-04 ENCOUNTER — Other Ambulatory Visit: Payer: Self-pay | Admitting: Family Medicine

## 2020-05-04 DIAGNOSIS — R9389 Abnormal findings on diagnostic imaging of other specified body structures: Secondary | ICD-10-CM

## 2020-05-04 DIAGNOSIS — Z87891 Personal history of nicotine dependence: Secondary | ICD-10-CM

## 2020-05-04 DIAGNOSIS — R0609 Other forms of dyspnea: Secondary | ICD-10-CM

## 2020-05-04 DIAGNOSIS — R918 Other nonspecific abnormal finding of lung field: Secondary | ICD-10-CM

## 2020-05-04 LAB — CBC WITH DIFFERENTIAL/PLATELET
Absolute Monocytes: 554 cells/uL (ref 200–950)
Basophils Absolute: 59 cells/uL (ref 0–200)
Basophils Relative: 0.9 %
Eosinophils Absolute: 403 cells/uL (ref 15–500)
Eosinophils Relative: 6.1 %
HCT: 37.9 % (ref 35.0–45.0)
Hemoglobin: 12.6 g/dL (ref 11.7–15.5)
Lymphs Abs: 1782 cells/uL (ref 850–3900)
MCH: 32.8 pg (ref 27.0–33.0)
MCHC: 33.2 g/dL (ref 32.0–36.0)
MCV: 98.7 fL (ref 80.0–100.0)
MPV: 9.5 fL (ref 7.5–12.5)
Monocytes Relative: 8.4 %
Neutro Abs: 3802 cells/uL (ref 1500–7800)
Neutrophils Relative %: 57.6 %
Platelets: 300 10*3/uL (ref 140–400)
RBC: 3.84 10*6/uL (ref 3.80–5.10)
RDW: 13.1 % (ref 11.0–15.0)
Total Lymphocyte: 27 %
WBC: 6.6 10*3/uL (ref 3.8–10.8)

## 2020-05-04 LAB — COMPLETE METABOLIC PANEL WITH GFR
AG Ratio: 1.4 (calc) (ref 1.0–2.5)
ALT: 11 U/L (ref 6–29)
AST: 15 U/L (ref 10–35)
Albumin: 4.3 g/dL (ref 3.6–5.1)
Alkaline phosphatase (APISO): 98 U/L (ref 37–153)
BUN/Creatinine Ratio: 16 (calc) (ref 6–22)
BUN: 25 mg/dL (ref 7–25)
CO2: 25 mmol/L (ref 20–32)
Calcium: 10.1 mg/dL (ref 8.6–10.4)
Chloride: 105 mmol/L (ref 98–110)
Creat: 1.54 mg/dL — ABNORMAL HIGH (ref 0.60–0.88)
GFR, Est African American: 37 mL/min/{1.73_m2} — ABNORMAL LOW (ref >=60)
GFR, Est Non African American: 32 mL/min/{1.73_m2} — ABNORMAL LOW (ref >=60)
Globulin: 3.1 g/dL (calc) (ref 1.9–3.7)
Glucose, Bld: 98 mg/dL (ref 65–99)
Potassium: 5 mmol/L (ref 3.5–5.3)
Sodium: 140 mmol/L (ref 135–146)
Total Bilirubin: 0.5 mg/dL (ref 0.2–1.2)
Total Protein: 7.4 g/dL (ref 6.1–8.1)

## 2020-05-04 LAB — VITAMIN D 25 HYDROXY (VIT D DEFICIENCY, FRACTURES): Vit D, 25-Hydroxy: 20 ng/mL — ABNORMAL LOW (ref 30–100)

## 2020-05-04 LAB — PARATHYROID HORMONE, INTACT (NO CA): PTH: 49 pg/mL (ref 14–64)

## 2020-05-04 LAB — PHOSPHORUS: Phosphorus: 3.7 mg/dL (ref 2.1–4.3)

## 2020-05-06 ENCOUNTER — Telehealth: Payer: Self-pay

## 2020-05-06 NOTE — Telephone Encounter (Signed)
Patient was informed that she has been scheduled to have her CT scan on Friday, December 17th at 9:30am at [facility]. Patient was instructed to arrive 42mins early to get registered and screened.   Patient expressed verbal understanding.

## 2020-05-21 ENCOUNTER — Ambulatory Visit
Admission: RE | Admit: 2020-05-21 | Discharge: 2020-05-21 | Disposition: A | Discharge status: Home / Self Care | Payer: Medicare Other | Source: Ambulatory Visit | Attending: Family Medicine | Admitting: Family Medicine

## 2020-05-21 ENCOUNTER — Other Ambulatory Visit: Payer: Self-pay

## 2020-05-21 DIAGNOSIS — R9389 Abnormal findings on diagnostic imaging of other specified body structures: Secondary | ICD-10-CM | POA: Diagnosis not present

## 2020-05-21 DIAGNOSIS — R918 Other nonspecific abnormal finding of lung field: Secondary | ICD-10-CM | POA: Diagnosis not present

## 2020-05-21 DIAGNOSIS — J439 Emphysema, unspecified: Secondary | ICD-10-CM | POA: Diagnosis not present

## 2020-05-21 DIAGNOSIS — I2510 Atherosclerotic heart disease of native coronary artery without angina pectoris: Secondary | ICD-10-CM | POA: Diagnosis not present

## 2020-05-21 DIAGNOSIS — I700 Atherosclerosis of aorta: Secondary | ICD-10-CM | POA: Diagnosis not present

## 2020-05-21 NOTE — Progress Notes (Signed)
Reviewed imaging - called pt tonight discussed results and f/up plan to consult pulmonology for possible biopsy Pt continues to feel better after Covid she still is having some shortness of breath especially if she does a lot more activity than she does get fatigued and tired for the rest the day but still gradually improving She denies any chest pain, hemoptysis, unintentional weight loss Stopped smoking 4 years ago - 79 y/o to 79 y/o smoking smoking most years 1/2 ppd Reviewed prior imaging other than the chest x-rays with her recent hospitalization in October with Covid pneumonia no dedicated chest imaging and the most recent abdominal CT studies or CT angios did not show anything in the lower lungs but atelectasis bilaterally -reviewed what I could from the past 3 to 4 years  Did discuss with her the possibility that it could be malignancy and she may need to talk to oncology or his CT surgical specialist with pulmonology team and may need to make decisions about treatment in the future however at this time for step is to see a pulmonologist and work on getting a biopsy and a definitive diagnosis.  Patient has a significant smoking history  We reviewed concerning signs and symptoms of patient should go immediately to the hospital for evaluation including but not limited to chest pain, severe shortness of breath, hemoptysis

## 2020-06-11 DIAGNOSIS — K219 Gastro-esophageal reflux disease without esophagitis: Secondary | ICD-10-CM | POA: Diagnosis not present

## 2020-06-11 DIAGNOSIS — F458 Other somatoform disorders: Secondary | ICD-10-CM | POA: Diagnosis not present

## 2020-06-22 ENCOUNTER — Encounter: Payer: Self-pay | Admitting: Pulmonary Disease

## 2020-06-22 ENCOUNTER — Ambulatory Visit (INDEPENDENT_AMBULATORY_CARE_PROVIDER_SITE_OTHER): Payer: Medicare Other | Admitting: Pulmonary Disease

## 2020-06-22 ENCOUNTER — Telehealth: Payer: Self-pay | Admitting: Pulmonary Disease

## 2020-06-22 ENCOUNTER — Other Ambulatory Visit: Payer: Self-pay

## 2020-06-22 VITALS — BP 124/78 | HR 98 | Temp 97.7°F | Ht 67.0 in | Wt 146.0 lb

## 2020-06-22 DIAGNOSIS — Z8616 Personal history of COVID-19: Secondary | ICD-10-CM | POA: Diagnosis not present

## 2020-06-22 DIAGNOSIS — Z87891 Personal history of nicotine dependence: Secondary | ICD-10-CM | POA: Diagnosis not present

## 2020-06-22 DIAGNOSIS — J439 Emphysema, unspecified: Secondary | ICD-10-CM | POA: Diagnosis not present

## 2020-06-22 DIAGNOSIS — R918 Other nonspecific abnormal finding of lung field: Secondary | ICD-10-CM

## 2020-06-22 NOTE — Progress Notes (Signed)
Subjective:    Patient ID: Christine Burnett, female    DOB: [birth year], 80 y.o.   MRN: [id number]  HPI Patient is an 80 year old former smoker (quit 2018, 61 pack years) who presents for evaluation of a right lower lobe mass. She is kindly referred by Threasa Alpha, PA-C. The patient was hospitalized for COVID-19 in October 2021 and was admitted from 13 October through 15 October where a masslike opacity was noted on the right infrahilar lung. It was recommended that a short-term, i.e. 6-week, follow-up imaging be done. At the time it was uncertain if this represented [place] from COVID-19. Patient was evaluated by her primary care practitioner in follow-up on 29 October and required a prednisone taper and azithromycin due to persistent complaints of congestion in the chest. Chest x-ray was obtained on 29 November that showed right lower lobe and a CT scan was recommended. This was performed on 21 May 2020. 4.0 x 3.5 cm mass on the right lower lobe and no mediastinal adenopathy was noted. The patient has thus been referred to [place] for further evaluation. Patient has been asymptomatic with regards to this mass. Her previously noted congestion due to COVID-19 has resolved. Dyspnea has resolved. CT scan of the chest showed emphysema however the patient is totally asymptomatic in this regard she only uses albuterol as needed and this is very rarely. She has not had any hemoptysis. Cough cleared after her prednisone taper and Azithromycin therapy. I reviewed the chest imaging with the patient and she was able to ask questions, these were answered to her satisfaction. We discussed the biopsy modalities.  She had normal stress test on October 2020.  Review of Systems A 10 point review of systems was performed and it is as noted above otherwise negative.  Past Medical History:  Diagnosis Date  . Allergic rhinitis   . Anxiety   . Atrial fibrillation ([place]) 08/09/2017  . Chronic bronchitis ([place])   . Chronic  kidney disease, stage III (moderate) ([place]) 08/31/2017   Seeing nephrologist  . CKD (chronic kidney disease), stage III ([place])   . Cornea scar   . Depression    unspecified  . Diverticulosis   . Hx of fracture of left hip 12/24/2017  . Hyperlipidemia   . Hyperparathyroidism, secondary renal ([place]) 08/31/2017   Managed by nephrologist  . Hypertension   . Osteoporosis   . Risk for falls   . Situational disturbance   . TIA (transient ischemic attack)    Past Surgical History:  Procedure Laterality Date  . COLONOSCOPY  2016  . ESOPHAGUS SURGERY    . INTRAMEDULLARY (IM) NAIL INTERTROCHANTERIC Left 03/26/2017   Procedure: INTRAMEDULLARY (IM) NAIL INTERTROCHANTRIC;  Surgeon: Hessie Knows, MD;  Location: [facility] ORS;  Service: Orthopedics;  Laterality: Left;   Family History  Problem Relation Age of Onset  . Other Mother        [place] posioning  . Heart attack Father   . Prostate cancer Father   . Heart attack Brother   . Diabetes Brother   . Hypertension Daughter   . Kidney cancer Neg Hx   . Bladder Cancer Neg Hx   . Colon cancer Neg Hx   . Colon polyps Neg Hx   . Rectal cancer Neg Hx    Social History   Tobacco Use  . Smoking status: Former Smoker    Packs/day: 1.00    Years: 61.00    Pack years: 61.00    Types: Cigarettes    Quit  date: 03/25/2017    Years since quitting: 3.2  . Smokeless tobacco: Never Used  Substance Use Topics  . Alcohol use: No   Allergies  Allergen Reactions  . Cefuroxime Axetil     caused rash.  . Ciprofloxacin     Unknown  . Codeine    Current Meds  Medication Sig  . acetaminophen (TYLENOL) 650 MG CR tablet Take 1 tablet (650 mg total) by mouth 4 (four) times daily as needed for pain.  Marland Kitchen albuterol (VENTOLIN HFA) 108 (90 Base) MCG/ACT inhaler Inhale 2 puffs into the lungs every 6 (six) hours as needed for wheezing or shortness of breath.  Marland Kitchen aspirin 81 MG EC tablet Take 81 mg by mouth daily. Swallow whole.  Marland Kitchen atorvastatin (LIPITOR) 10 MG  tablet Take 1 tablet (10 mg total) by mouth at bedtime.  . Cholecalciferol (VITAMIN D-3) 1000 units CAPS Take 2 capsules by mouth daily. Takes occassionally  . hydrOXYzine (ATARAX/VISTARIL) 10 MG tablet Take 1 tablet (10 mg total) by mouth every 8 (eight) hours as needed for anxiety.  . melatonin 5 MG TABS Take 5 mg by mouth at bedtime. Pt taking 2 tabs qhs  . PARoxetine (PAXIL) 40 MG tablet Take 1 tablet (40 mg total) by mouth every morning.  . Probiotic Product (PROBIOTIC-10 PO) Take by mouth.   Immunization History  Administered Date(s) Administered  . Fluad Quad(high Dose 65+) 06/27/2019  . Influenza Split 03/15/2010, 03/21/2011  . Influenza, High Dose Seasonal PF 04/07/2015, 03/22/2016, 03/13/2017, 02/25/2018  . Influenza,inj,Quad PF,6+ Mos 03/19/2013, 03/10/2014  . Pneumococcal Conjugate-13 11/08/2017  . Pneumococcal Polysaccharide-23 01/21/2019  . Tdap 05/29/2017       Objective:   Physical Exam BP 124/78 (BP Location: Left Arm, Cuff Size: Normal)   Pulse 98   Temp 97.7 F (36.5 C) (Temporal)   Ht 5\' 7"  (1.702 m)   Wt 146 lb (66.2 kg)   SpO2 96%   BMI 22.87 kg/m  GENERAL: Well-developed, well-nourished elderly woman, in no acute distress. She presents in transport chair due to knee pain. No conversational dyspnea.  HEAD: Normocephalic, atraumatic.  EYES: Opacity over left eye, right eye with normal pupillary reflex.  No scleral icterus.  MOUTH: Nose/mouth/throat not examined due to masking requirements for COVID 19. NECK: Supple. No thyromegaly. Trachea midline. No JVD.  No adenopathy. PULMONARY: Good air entry bilaterally.  No adventitious sounds. CARDIOVASCULAR: S1 and S2. Regular rate and rhythm. No rubs, murmurs or gallops heard. ABDOMEN: Benign. MUSCULOSKELETAL: No joint deformity, no clubbing, no edema.  NEUROLOGIC: No overt focal deficit. Speech is fluent. Fully oriented. SKIN: Intact,warm,dry. PSYCH: Mood and behavior normal.  Chest x-ray from 03 May 2020:     CT chest performed 21 May 2020: Showing right lower lobe mass  Assessment & Plan:     ICD-10-CM   1. 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 Pulmonary emphysema, unspecified emphysema type ([place])  J43.9    Asymptomatic in this regard Continue albuterol as needed  3. Personal history of COVID-19  Z86.16    No evidence of residual pulmonary disease  4. Former smoker  Z87.891    Remains abstinent of cigarette use   Discussion:  Patient's right lower lobe masses carcinoma until proven otherwise.  She will need biopsy for diagnosis.  We discussed the different modalities of biopsy to include percutaneous needle biopsy and navigational bronchoscopy, with  the understanding that navigational bronchoscopy will require general anesthesia.  The patient opts for navigational bronchoscopy.   Benefits, limitations and potential complications of the procedure were discussed with the patient including, but not limited to bleeding, hemoptysis, respiratory failure requiring intubation and/or prolongued mechanical ventilation, infection, pneumothorax (collapse of lung) requiring chest tube placement, stroke  or even death. Patient agrees to proceed.  Procedure has been scheduled for 24 January at 12:30 PM. We will see the patient in follow-up 4 weeks.  Renold Don, MD [facility]   *This note was dictated using voice recognition software/Dragon.  Despite best efforts to proofread, errors can occur which can change the meaning.  Any change was purely unintentional.

## 2020-06-22 NOTE — Patient Instructions (Signed)
You have a spot on the right lung that needs to be evaluated with a biopsy.  We are planning to do this under general anesthesia through a procedure called navigational bronchoscopy (lung GPS).  We should be able to reach this area, you should not be aware of the procedure.  Is always a small risk of collapse of the lung from the procedure however this is an less than 3% of the time.  Time the procedure for 24 January at 1230.  This is a same-day surgery procedure.  You will need to have someone bring you as he will not be able to drive after the procedure.  You will get a call from the preoperative evaluation clinic and they will give you all the instructions necessary.  We will see him in follow-up in about 3 to 4 weeks.

## 2020-06-22 NOTE — Telephone Encounter (Signed)
Patient has been scheduled for bronchoscopy with navigation and cellvizio for 06/28/2020 at 12:30.  XY:VOPF mass CPT: [id number], [id number]  Rodena Piety, please see bronch info.

## 2020-06-22 NOTE — Telephone Encounter (Signed)
Patient has Medicare part [place] No Prior Josem Kaufmann is required for these 2 insurance plans

## 2020-06-22 NOTE — H&P (View-Only) (Signed)
Subjective:    Patient ID: Christine Burnett, female    DOB: 1940/08/14, 80 y.o.   MRN: [id number]  HPI Patient is an 80 year old former smoker (quit 2018, 61 pack years) who presents for evaluation of a right lower lobe mass. She is kindly referred by Threasa Alpha, PA-C. The patient was hospitalized for COVID-19 in October 2021 and was admitted from 13 October through 15 October where a masslike opacity was noted on the right infrahilar lung. It was recommended that a short-term, i.e. 6-week, follow-up imaging be done. At the time it was uncertain if this represented Albers from COVID-19. Patient was evaluated by her primary care practitioner in follow-up on 29 October and required a prednisone taper and azithromycin due to persistent complaints of congestion in the chest. Chest x-ray was obtained on 29 November that showed right lower lobe and a CT scan was recommended. This was performed on 21 May 2020. 4.0 x 3.5 cm mass on the right lower lobe and no mediastinal adenopathy was noted. The patient has thus been referred to [place] for further evaluation. Patient has been asymptomatic with regards to this mass. Her previously noted congestion due to COVID-19 has resolved. Dyspnea has resolved. CT scan of the chest showed emphysema however the patient is totally asymptomatic in this regard she only uses albuterol as needed and this is very rarely. She has not had any hemoptysis. Cough cleared after her prednisone taper and Azithromycin therapy. I reviewed the chest imaging with the patient and she was able to ask questions, these were answered to her satisfaction. We discussed the biopsy modalities.  She had normal stress test on October 2020.  Review of Systems A 10 point review of systems was performed and it is as noted above otherwise negative.  Past Medical History:  Diagnosis Date  . Allergic rhinitis   . Anxiety   . Atrial fibrillation ([place]) 08/09/2017  . Chronic bronchitis ([place])   . Chronic  kidney disease, stage III (moderate) ([place]) 08/31/2017   Seeing nephrologist  . CKD (chronic kidney disease), stage III ([place])   . Cornea scar   . Depression    unspecified  . Diverticulosis   . Hx of fracture of left hip 12/24/2017  . Hyperlipidemia   . 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 Hypertension   . Osteoporosis   . Risk for falls   . Situational disturbance   . TIA (transient ischemic attack)    Past Surgical History:  Procedure Laterality Date  . COLONOSCOPY  2016  . ESOPHAGUS SURGERY    . INTRAMEDULLARY (IM) NAIL INTERTROCHANTERIC Left 03/26/2017   Procedure: INTRAMEDULLARY (IM) NAIL INTERTROCHANTRIC;  Surgeon: Hessie Knows, MD;  Location: [facility] ORS;  Service: Orthopedics;  Laterality: Left;   Family History  Problem Relation Age of Onset  . Other Mother        [place] posioning  . Heart attack Father   . Prostate cancer Father   . Heart attack Brother   . Diabetes Brother   . Hypertension Daughter   . Kidney cancer Neg Hx   . Bladder Cancer Neg Hx   . Colon cancer Neg Hx   . Colon polyps Neg Hx   . Rectal cancer Neg Hx    Social History   Tobacco Use  . Smoking status: Former Smoker    Packs/day: 1.00    Years: 61.00    Pack years: 61.00    Types: Cigarettes    Quit  date: 03/25/2017    Years since quitting: 3.2  . Smokeless tobacco: Never Used  Substance Use Topics  . Alcohol use: No   Allergies  Allergen Reactions  . Cefuroxime Axetil     caused rash.  . Ciprofloxacin     Unknown  . Codeine    Current Meds  Medication Sig  . acetaminophen (TYLENOL) 650 MG CR tablet Take 1 tablet (650 mg total) by mouth 4 (four) times daily as needed for pain.  Marland Kitchen albuterol (VENTOLIN HFA) 108 (90 Base) MCG/ACT inhaler Inhale 2 puffs into the lungs every 6 (six) hours as needed for wheezing or shortness of breath.  Marland Kitchen aspirin 81 MG EC tablet Take 81 mg by mouth daily. Swallow whole.  Marland Kitchen atorvastatin (LIPITOR) 10 MG  tablet Take 1 tablet (10 mg total) by mouth at bedtime.  . Cholecalciferol (VITAMIN D-3) 1000 units CAPS Take 2 capsules by mouth daily. Takes occassionally  . hydrOXYzine (ATARAX/VISTARIL) 10 MG tablet Take 1 tablet (10 mg total) by mouth every 8 (eight) hours as needed for anxiety.  . melatonin 5 MG TABS Take 5 mg by mouth at bedtime. Pt taking 2 tabs qhs  . PARoxetine (PAXIL) 40 MG tablet Take 1 tablet (40 mg total) by mouth every morning.  . Probiotic Product (PROBIOTIC-10 PO) Take by mouth.   Immunization History  Administered Date(s) Administered  . Fluad Quad(high Dose 65+) 06/27/2019  . Influenza Split 03/15/2010, 03/21/2011  . Influenza, High Dose Seasonal PF 04/07/2015, 03/22/2016, 03/13/2017, 02/25/2018  . Influenza,inj,Quad PF,6+ Mos 03/19/2013, 03/10/2014  . Pneumococcal Conjugate-13 11/08/2017  . Pneumococcal Polysaccharide-23 01/21/2019  . Tdap 05/29/2017       Objective:   Physical Exam BP 124/78 (BP Location: Left Arm, Cuff Size: Normal)   Pulse 98   Temp 97.7 F (36.5 C) (Temporal)   Ht 5\' 7"  (1.702 m)   Wt 146 lb (66.2 kg)   SpO2 96%   BMI 22.87 kg/m  GENERAL: Well-developed, well-nourished elderly woman, in no acute distress. She presents in transport chair due to knee pain. No conversational dyspnea.  HEAD: Normocephalic, atraumatic.  EYES: Opacity over left eye, right eye with normal pupillary reflex.  No scleral icterus.  MOUTH: Nose/mouth/throat not examined due to masking requirements for COVID 19. NECK: Supple. No thyromegaly. Trachea midline. No JVD.  No adenopathy. PULMONARY: Good air entry bilaterally.  No adventitious sounds. CARDIOVASCULAR: S1 and S2. Regular rate and rhythm. No rubs, murmurs or gallops heard. ABDOMEN: Benign. MUSCULOSKELETAL: No joint deformity, no clubbing, no edema.  NEUROLOGIC: No overt focal deficit. Speech is fluent. Fully oriented. SKIN: Intact,warm,dry. PSYCH: Mood and behavior normal.  Chest x-ray from 03 May 2020:     CT chest performed 21 May 2020: Showing right lower lobe mass  Assessment & Plan:     ICD-10-CM   1. 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 Pulmonary emphysema, unspecified emphysema type ([place])  J43.9    Asymptomatic in this regard Continue albuterol as needed  3. Personal history of COVID-19  Z86.16    No evidence of residual pulmonary disease  4. Former smoker  Z87.891    Remains abstinent of cigarette use   Discussion:  Patient's right lower lobe masses carcinoma until proven otherwise.  She will need biopsy for diagnosis.  We discussed the different modalities of biopsy to include percutaneous needle biopsy and navigational bronchoscopy, with  the understanding that navigational bronchoscopy will require general anesthesia.  The patient opts for navigational bronchoscopy.   Benefits, limitations and potential complications of the procedure were discussed with the patient including, but not limited to bleeding, hemoptysis, respiratory failure requiring intubation and/or prolongued mechanical ventilation, infection, pneumothorax (collapse of lung) requiring chest tube placement, stroke  or even death. Patient agrees to proceed.  Procedure has been scheduled for 24 January at 12:30 PM. We will see the patient in follow-up 4 weeks.  Renold Don, MD [facility]   *This note was dictated using voice recognition software/Dragon.  Despite best efforts to proofread, errors can occur which can change the meaning.  Any change was purely unintentional.

## 2020-06-23 ENCOUNTER — Encounter: Payer: Self-pay | Admitting: Pulmonary Disease

## 2020-06-23 NOTE — Telephone Encounter (Signed)
Phone pre admit visit 06/24/2020 between 8-1 covid test 06/25/2020 between 8-1  Lm for patient.

## 2020-06-23 NOTE — Telephone Encounter (Signed)
Patient is aware of below date/times.  She voiced her understanding and had no further questions.  Nothing further needed.

## 2020-06-24 ENCOUNTER — Other Ambulatory Visit: Payer: Self-pay

## 2020-06-24 ENCOUNTER — Other Ambulatory Visit
Admission: RE | Admit: 2020-06-24 | Discharge: 2020-06-24 | Disposition: A | Discharge status: Home / Self Care | Payer: Medicare Other | Source: Ambulatory Visit | Attending: Pulmonary Disease | Admitting: Pulmonary Disease

## 2020-06-24 DIAGNOSIS — Z01818 Encounter for other preprocedural examination: Secondary | ICD-10-CM | POA: Insufficient documentation

## 2020-06-24 DIAGNOSIS — Z20822 Contact with and (suspected) exposure to covid-19: Secondary | ICD-10-CM | POA: Insufficient documentation

## 2020-06-24 HISTORY — DX: Chronic obstructive pulmonary disease, unspecified: J44.9

## 2020-06-24 HISTORY — DX: Acute myocardial infarction, unspecified: I21.9

## 2020-06-24 NOTE — Patient Instructions (Signed)
Your procedure is scheduled on: Monday June 28, 2020. Report to Day Surgery inside [facility] 2nd floor (stop by admissions desk first, before going upstairs). To find out your arrival time please call [phone] between 1PM - 3PM on Friday June 25, 2020.  Remember: Instructions that are not followed completely may result in serious medical risk,  up to and including death, or upon the discretion of your surgeon and anesthesiologist your  surgery may need to be rescheduled.     _X__ 1. Do not eat food or drink liquids after midnight the night before your procedure.                 No chewing gum or hard candies.   __X__2.  On the morning of surgery brush your teeth with toothpaste and water, you                may rinse your mouth with mouthwash if you wish.  Do not swallow any toothpaste of mouthwash.     _X__ 3.  No Alcohol for 24 hours before or after surgery.   _X__ 4.  Do Not Smoke or use e-cigarettes For 24 Hours Prior to Your Surgery.                 Do not use any chewable tobacco products for at least 6 hours prior to                 Surgery.  _X__  5.  Do not use any recreational drugs (marijuana, cocaine, heroin, ecstasy, MDMA or other)                For at least one week prior to your surgery.  Combination of these drugs with anesthesia                May have life threatening results.  __X__ 6.  Notify your doctor if there is any change in your medical condition      (cold, fever, infections).     Do not wear jewelry, make-up, hairpins, clips or nail polish. Do not wear lotions, powders, or perfumes. You may wear deodorant. Do not shave 48 hours prior to surgery. Men may shave face and neck. Do not bring valuables to the hospital.    [facility] is not responsible for any belongings or valuables.  Contacts, dentures or bridgework may not be worn into surgery. Leave your suitcase in the car. After surgery it may be brought to your  room. For patients admitted to the hospital, discharge time is determined by your treatment team.   Patients discharged the day of surgery will not be allowed to drive home.   Make arrangements for someone to be with you for the first 24 hours of your Same Day Discharge.   __x__ Take these medicines the morning of surgery with A SIP OF WATER:    1. None   2.   3.   4.  5.  6.  ____ Fleet Enema (as directed)   ____ Use CHG Soap (or wipes) as directed  ____ Use Benzoyl Peroxide Gel as instructed  ____ Use inhalers on the day of surgery  ____ Stop metformin 2 days prior to surgery    ____ Take 1/2 of usual insulin dose the night before surgery. No insulin the morning          of surgery.   __x__ Stop aspirin as instructed by your provider.   __x__ Stop  Anti-inflammatories such as Ibuprofen, Aleve, Advil, naproxen, and or BC powders.    __X__ Stop supplements until after surgery.    __X__ Do not start any herbal supplements before your procedure.    If you have any questions regarding your pre-procedure instructions,  Please call Pre-admit Testing at [phone].

## 2020-06-25 ENCOUNTER — Other Ambulatory Visit: Payer: Self-pay

## 2020-06-25 ENCOUNTER — Other Ambulatory Visit: Payer: Medicare Other

## 2020-06-25 ENCOUNTER — Encounter
Admission: RE | Admit: 2020-06-25 | Discharge: 2020-06-25 | Disposition: A | Discharge status: Home / Self Care | Payer: Medicare Other | Source: Ambulatory Visit | Attending: Pulmonary Disease | Admitting: Pulmonary Disease

## 2020-06-25 DIAGNOSIS — Z01810 Encounter for preprocedural cardiovascular examination: Secondary | ICD-10-CM | POA: Diagnosis not present

## 2020-06-25 DIAGNOSIS — Z01818 Encounter for other preprocedural examination: Secondary | ICD-10-CM | POA: Diagnosis not present

## 2020-06-25 DIAGNOSIS — Z20822 Contact with and (suspected) exposure to covid-19: Secondary | ICD-10-CM | POA: Diagnosis not present

## 2020-06-26 LAB — SARS CORONAVIRUS 2 (TAT 6-24 HRS): SARS Coronavirus 2: NEGATIVE

## 2020-06-28 ENCOUNTER — Encounter
Admission: RE | Disposition: A | Discharge status: Home / Self Care | Payer: Self-pay | Source: Home / Self Care | Attending: Pulmonary Disease

## 2020-06-28 ENCOUNTER — Other Ambulatory Visit: Payer: Self-pay

## 2020-06-28 ENCOUNTER — Encounter: Payer: Self-pay | Admitting: Pulmonary Disease

## 2020-06-28 ENCOUNTER — Ambulatory Visit
Admission: RE | Admit: 2020-06-28 | Discharge: 2020-06-28 | Disposition: A | Discharge status: Home / Self Care | Payer: Medicare Other | Attending: Pulmonary Disease | Admitting: Pulmonary Disease

## 2020-06-28 ENCOUNTER — Ambulatory Visit: Payer: Medicare Other

## 2020-06-28 ENCOUNTER — Ambulatory Visit: Payer: Medicare Other | Admitting: Anesthesiology

## 2020-06-28 DIAGNOSIS — Z8042 Family history of malignant neoplasm of prostate: Secondary | ICD-10-CM | POA: Insufficient documentation

## 2020-06-28 DIAGNOSIS — C3431 Malignant neoplasm of lower lobe, right bronchus or lung: Secondary | ICD-10-CM | POA: Diagnosis not present

## 2020-06-28 DIAGNOSIS — C3490 Malignant neoplasm of unspecified part of unspecified bronchus or lung: Secondary | ICD-10-CM

## 2020-06-28 DIAGNOSIS — I129 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 stage 4 chronic kidney disease, or unspecified chronic kidney disease: Secondary | ICD-10-CM | POA: Insufficient documentation

## 2020-06-28 DIAGNOSIS — N2581 Secondary hyperparathyroidism of renal origin: Secondary | ICD-10-CM | POA: Insufficient documentation

## 2020-06-28 DIAGNOSIS — Z881 Allergy status to other antibiotic agents status: Secondary | ICD-10-CM | POA: Insufficient documentation

## 2020-06-28 DIAGNOSIS — Z8673 Personal history of transient ischemic attack (TIA), and cerebral infarction without residual deficits: Secondary | ICD-10-CM | POA: Diagnosis not present

## 2020-06-28 DIAGNOSIS — F418 Other specified anxiety disorders: Secondary | ICD-10-CM | POA: Diagnosis not present

## 2020-06-28 DIAGNOSIS — J449 Chronic obstructive pulmonary disease, unspecified: Secondary | ICD-10-CM | POA: Diagnosis not present

## 2020-06-28 DIAGNOSIS — Z888 Allergy status to other drugs, medicaments and biological substances status: Secondary | ICD-10-CM | POA: Insufficient documentation

## 2020-06-28 DIAGNOSIS — Z87891 Personal history of nicotine dependence: Secondary | ICD-10-CM | POA: Diagnosis not present

## 2020-06-28 DIAGNOSIS — J42 Unspecified chronic bronchitis: Secondary | ICD-10-CM | POA: Diagnosis not present

## 2020-06-28 DIAGNOSIS — N183 Chronic kidney disease, stage 3 unspecified: Secondary | ICD-10-CM | POA: Diagnosis not present

## 2020-06-28 DIAGNOSIS — G802 Spastic hemiplegic cerebral palsy: Secondary | ICD-10-CM | POA: Diagnosis not present

## 2020-06-28 DIAGNOSIS — Z885 Allergy status to narcotic agent status: Secondary | ICD-10-CM | POA: Diagnosis not present

## 2020-06-28 DIAGNOSIS — Z8249 Family history of ischemic heart disease and other diseases of the circulatory system: Secondary | ICD-10-CM | POA: Insufficient documentation

## 2020-06-28 DIAGNOSIS — Z833 Family history of diabetes mellitus: Secondary | ICD-10-CM | POA: Diagnosis not present

## 2020-06-28 DIAGNOSIS — R918 Other nonspecific abnormal finding of lung field: Secondary | ICD-10-CM

## 2020-06-28 DIAGNOSIS — E785 Hyperlipidemia, unspecified: Secondary | ICD-10-CM | POA: Diagnosis not present

## 2020-06-28 DIAGNOSIS — N1832 Chronic kidney disease, stage 3b: Secondary | ICD-10-CM | POA: Diagnosis not present

## 2020-06-28 DIAGNOSIS — I700 Atherosclerosis of aorta: Secondary | ICD-10-CM | POA: Diagnosis not present

## 2020-06-28 DIAGNOSIS — Z9889 Other specified postprocedural states: Secondary | ICD-10-CM

## 2020-06-28 DIAGNOSIS — I4891 Unspecified atrial fibrillation: Secondary | ICD-10-CM | POA: Insufficient documentation

## 2020-06-28 HISTORY — PX: VIDEO BRONCHOSCOPY WITH ENDOBRONCHIAL NAVIGATION: SHX6175

## 2020-06-28 SURGERY — VIDEO BRONCHOSCOPY WITH ENDOBRONCHIAL NAVIGATION
Anesthesia: General | Laterality: Right

## 2020-06-28 MED ORDER — ONDANSETRON HCL 4 MG/2ML IJ SOLN
INTRAMUSCULAR | Status: DC | PRN
  Administered 2020-06-28: 4 mg via INTRAVENOUS

## 2020-06-28 MED ORDER — FENTANYL CITRATE (PF) 100 MCG/2ML IJ SOLN: 25.0000 ug | INTRAMUSCULAR | Status: DC | PRN

## 2020-06-28 MED ORDER — ORAL CARE MOUTH RINSE: 15.0000 mL | Freq: Once | OROMUCOSAL | Status: AC

## 2020-06-28 MED ORDER — CHLORHEXIDINE GLUCONATE 0.12 % MT SOLN
OROMUCOSAL | Status: AC
  Administered 2020-06-28: 15 mL via OROMUCOSAL
  Filled 2020-06-28: qty 15

## 2020-06-28 MED ORDER — FENTANYL CITRATE (PF) 100 MCG/2ML IJ SOLN
INTRAMUSCULAR | Status: AC
  Filled 2020-06-28: qty 2

## 2020-06-28 MED ORDER — ONDANSETRON HCL 4 MG/2ML IJ SOLN: 4.0000 mg | Freq: Once | INTRAMUSCULAR | Status: DC | PRN

## 2020-06-28 MED ORDER — LACTATED RINGERS IV SOLN: INTRAVENOUS | Status: DC | PRN

## 2020-06-28 MED ORDER — SODIUM CHLORIDE 0.9 % IV SOLN: Freq: Once | INTRAVENOUS | Status: DC

## 2020-06-28 MED ORDER — LIDOCAINE HCL (CARDIAC) PF 100 MG/5ML IV SOSY
PREFILLED_SYRINGE | INTRAVENOUS | Status: DC | PRN
  Administered 2020-06-28: 60 mg via INTRAVENOUS

## 2020-06-28 MED ORDER — PHENYLEPHRINE HCL (PRESSORS) 10 MG/ML IV SOLN
INTRAVENOUS | Status: DC | PRN
  Administered 2020-06-28 (×5): 100 ug via INTRAVENOUS

## 2020-06-28 MED ORDER — SODIUM CHLORIDE 0.9 % IV SOLN: INTRAVENOUS | Status: DC

## 2020-06-28 MED ORDER — ROCURONIUM BROMIDE 100 MG/10ML IV SOLN
INTRAVENOUS | Status: DC | PRN
  Administered 2020-06-28: 30 mg via INTRAVENOUS
  Administered 2020-06-28: 10 mg via INTRAVENOUS

## 2020-06-28 MED ORDER — PROPOFOL 10 MG/ML IV BOLUS
INTRAVENOUS | Status: AC
  Filled 2020-06-28: qty 20

## 2020-06-28 MED ORDER — PROPOFOL 500 MG/50ML IV EMUL
INTRAVENOUS | Status: DC | PRN
  Administered 2020-06-28: 100 ug/kg/min via INTRAVENOUS

## 2020-06-28 MED ORDER — PROPOFOL 500 MG/50ML IV EMUL
INTRAVENOUS | Status: AC
  Filled 2020-06-28: qty 50

## 2020-06-28 MED ORDER — CHLORHEXIDINE GLUCONATE 0.12 % MT SOLN: 15.0000 mL | Freq: Once | OROMUCOSAL | Status: AC

## 2020-06-28 MED ORDER — FAMOTIDINE 20 MG PO TABS
ORAL_TABLET | ORAL | Status: AC
  Administered 2020-06-28: 20 mg via ORAL
  Filled 2020-06-28: qty 1

## 2020-06-28 MED ORDER — IPRATROPIUM-ALBUTEROL 0.5-2.5 (3) MG/3ML IN SOLN: 3.0000 mL | Freq: Once | RESPIRATORY_TRACT | Status: DC

## 2020-06-28 MED ORDER — BUTAMBEN-TETRACAINE-BENZOCAINE 2-2-14 % EX AERO
1.0000 | INHALATION_SPRAY | Freq: Once | CUTANEOUS | Status: DC
  Filled 2020-06-28: qty 20

## 2020-06-28 MED ORDER — PROPOFOL 10 MG/ML IV BOLUS
INTRAVENOUS | Status: DC | PRN
  Administered 2020-06-28: 70 mg via INTRAVENOUS

## 2020-06-28 MED ORDER — FENTANYL CITRATE (PF) 100 MCG/2ML IJ SOLN
INTRAMUSCULAR | Status: DC | PRN
  Administered 2020-06-28: 50 ug via INTRAVENOUS

## 2020-06-28 MED ORDER — DEXAMETHASONE SODIUM PHOSPHATE 10 MG/ML IJ SOLN
INTRAMUSCULAR | Status: DC | PRN
  Administered 2020-06-28: 6 mg via INTRAVENOUS

## 2020-06-28 MED ORDER — FAMOTIDINE 20 MG PO TABS: 20.0000 mg | ORAL_TABLET | Freq: Once | ORAL | Status: AC

## 2020-06-28 MED ORDER — SUGAMMADEX SODIUM 500 MG/5ML IV SOLN
INTRAVENOUS | Status: DC | PRN
  Administered 2020-06-28: 200 mg via INTRAVENOUS

## 2020-06-28 NOTE — Discharge Instructions (Addendum)
Flexible Bronchoscopy  Flexible bronchoscopy is a procedure used to examine the passageways in the lungs. During the procedure, a thin, flexible tool with a camera (bronchoscope) is passed into the mouth or nose, down through the windpipe (trachea), and into the air tubes in the lungs (bronchi). This tool allows the health care provider to look inside the lungs and to take samples for testing, if needed. Tell a health care provider about:  Any allergies you have.  All medicines you are taking, including vitamins, herbs, eye drops, creams, and over-the-counter medicines.  Any problems you or family members have had with anesthetic medicines.  Any blood disorders you have.  Any surgeries you have had.  Any medical conditions you have.  Whether you are pregnant or may be pregnant. What are the risks? Generally, this is a safe procedure. However, problems may occur, including:  Infection.  Bleeding.  Damage to other structures or organs.  Allergic reactions to medicines.  Collapsed lung (pneumothorax).  Increased need for oxygen or difficulty breathing after the procedure. What happens before the procedure? Staying hydrated Follow instructions from your health care provider about hydration, which may include:  Up to 2 hours before the procedure - you may continue to drink clear liquids, such as water, clear fruit juice, black coffee, and plain tea.   Eating and drinking restrictions Follow instructions from your health care provider about eating and drinking, which may include:  8 hours before the procedure - stop eating heavy meals or foods, such as meat, fried foods, or fatty foods.  6 hours before the procedure - stop eating light meals or foods, such as toast or cereal.  6 hours before the procedure - stop drinking milk or drinks that contain milk.  2 hours before the procedure - stop drinking clear liquids. Medicines Ask your health care provider about:  Changing or  stopping your regular medicines. This is especially important if you are taking diabetes medicines or blood thinners.  Taking medicines such as aspirin and ibuprofen. These medicines can thin your blood. Do not take these medicines unless your health care provider tells you to take them.  Taking over-the-counter medicines, vitamins, herbs, and supplements. General instructions  You may be given antibiotic medicine to help lower the risk of infection.  Plan to have a responsible adult take you home from the hospital or clinic.  If you will be going home right after the procedure, plan to have a responsible adult care for you for the time you are told. This is important. What happens during the procedure?  An IV will be inserted into one of your veins.  You will be given a medicine (local anesthetic) to numb your mouth, nose, throat, and voice box (larynx). You may also be given one or more of the following: ? A medicine to help you relax (sedative). ? A medicine to control coughing. ? A medicine to dry up any fluids or secretions in your lungs.  A bronchoscope will be passed into your nose or mouth, and into your lungs. Your health care provider will examine your lungs.  Samples of airway secretions may be collected for testing.  If abnormal areas are seen in your airways, samples of tissue may be removed and checked under a microscope (biopsy).  If tissue samples are needed from the outer parts of the lung, a type of X-ray (fluoroscopy) may be used to guide the bronchoscope to these areas.  If bleeding occurs, you may be given medicine to  stop or decrease the bleeding. The procedure may vary among health care providers and hospitals. What can I expect after the procedure?  Your blood pressure, heart rate, breathing rate, and blood oxygen level will be monitored until you leave the hospital or clinic.  You may have a chest X-ray to check for signs of pneumothorax.  You willnot be  allowed to eat or drink anything for 2 hours after your procedure.  If a biopsy was taken, it is up to you to get the results of the test. Ask your health care provider, or the department that is doing the procedure, when your results will be ready.  You may have the following symptoms for 24-48 hours: ? A cough that is worse than it was before the procedure. ? A low-grade fever. ? A sore throat or hoarse voice. ? Some blood in the mucus from your lungs (sputum), if a biopsy was done. Follow these instructions at home: Eating and drinking  Do not eat or drink anything, including water, for 2 hours after your procedure, or until your numbing medicine has worn off. Having a numb throat increases your risk of burning yourself or choking.  Start eating soft foods and slowly drinking liquids after your numbness is gone and your cough and gag reflexes have returned.  You may return to your normal diet the day after the procedure. Driving  If you were given a sedative during the procedure, it can affect you for several hours. Do not drive or operate machinery until your health care provider says that it is safe.  Ask your health care provider if the medicine prescribed to you requires you to avoid driving or using machinery.  Return to your normal activities as told by your health care provider. Ask your health care provider what activities are safe for you. General instructions  Take over-the-counter and prescription medicines only as told by your health care provider.  Do not use any products that contain nicotine or tobacco. These products include cigarettes, chewing tobacco, and vaping devices, such as e-cigarettes. If you need help quitting, ask your health care provider.  Keep all follow-up visits. This is important.   Get help right away if:  You have shortness of breath that gets worse.  You become light-headed or feel like you might faint.  You have chest pain.  You cough up  more than a small amount of blood. These symptoms may represent a serious problem that is an emergency. Do not wait to see if the symptoms will go away. Get medical help right away. Call your local emergency services (911 in the [place] Do not drive yourself to the hospital. Summary  Flexible bronchoscopy is a procedure that allows your health care provider to look closely inside your lungs and to take testing samples if needed.  Risks of flexible bronchoscopy include bleeding, infection, and collapsed lung (pneumothorax).  Before the procedure, you will be given a medicine to numb your mouth, nose, throat, and voice box. Then, a bronchoscope will be passed into your nose or mouth, and into your lungs.  After the procedure, your blood pressure, heart rate, breathing rate, and blood oxygen level will be monitored until you leave the hospital or clinic. You may have a chest X-ray to check for signs of pneumothorax.  You will not be allowed to eat or drink anything for 2 hours after your procedure. This information is not intended to replace advice given to you by your health care provider.  Make sure you discuss any questions you have with your health care provider. Document Revised: 12/11/2019 Document Reviewed: 12/11/2019 Elsevier Patient Education  2021 [employer]   1) The drugs that you were given will stay in your system until tomorrow so for the next 24 hours you should not:  A) Drive an automobile B) Make any legal decisions C) Drink any alcoholic beverage   2) You may resume regular meals tomorrow.  Today it is better to start with liquids and gradually work up to solid foods.  You may eat anything you prefer, but it is better to start with liquids, then soup and crackers, and gradually work up to solid foods.   3) Please notify your doctor immediately if you have any unusual bleeding, trouble breathing, redness and pain at the  surgery site, drainage, fever, or pain not relieved by medication.    4) Additional Instructions:        Please contact your physician with any problems or Same Day Surgery at [phone], Monday through Friday 6 am to 4 pm, or [place] [facility] number at [phone].

## 2020-06-28 NOTE — Anesthesia Preprocedure Evaluation (Addendum)
Anesthesia Evaluation  Patient identified by MRN, date of birth, ID band Patient awake    Reviewed: Allergy & Precautions, H&P , NPO status , Patient's Chart, lab work & pertinent test results  History of Anesthesia Complications Negative for: history of anesthetic complications  Airway Mallampati: II  TM Distance: <3 FB     Dental  (+) Edentulous Lower Prominent upper central incisors:   Pulmonary neg sleep apnea, COPD, former smoker,  Lung mass   breath sounds clear to auscultation       Cardiovascular hypertension, (-) angina(-) Cardiac Stents + dysrhythmias Atrial Fibrillation  Rhythm:regular Rate:Normal     Neuro/Psych PSYCHIATRIC DISORDERS Anxiety Depression TIA   GI/Hepatic negative GI ROS, Neg liver ROS,   Endo/Other  negative endocrine ROShyperparthyroidism  Renal/GU Renal disease (CKD)     Musculoskeletal   Abdominal   Peds  Hematology negative hematology ROS (+)   Anesthesia Other Findings Past Medical History: No date: Allergic rhinitis No date: Anxiety 08/09/2017: Atrial fibrillation (HCC) No date: Chronic bronchitis ([place]) 08/31/2017: Chronic kidney disease, stage III (moderate) ([facility])     Comment:  Seeing nephrologist No date: CKD (chronic kidney disease), stage III (H[facility]) No date: COPD (chronic obstructive pulmonary disease) (H[facility]) No date: Cornea scar No date: Depression     Comment:  unspecified No date: Diverticulosis 12/24/2017: Hx of fracture of left hip No date: Hyperlipidemia 08/31/2017: Hyperparathyroidism, secondary renal ([place])     Comment:  Managed by nephrologist No date: Hypertension No date: Myocardial infarction ([place])     Comment:  TIA 2x several years ago  No date: Osteoporosis No date: Risk for falls No date: Situational disturbance No date: TIA (transient ischemic attack)  Past Surgical History: 2016: COLONOSCOPY No date: ESOPHAGUS SURGERY 03/26/2017: INTRAMEDULLARY (IM)  NAIL INTERTROCHANTERIC; Left     Comment:  Procedure: INTRAMEDULLARY (IM) NAIL INTERTROCHANTRIC;                Surgeon: Hessie Knows, MD;  Location: [facility] ORS;                Service: Orthopedics;  Laterality: Left;     Reproductive/Obstetrics negative OB ROS                            Anesthesia Physical Anesthesia Plan  ASA: III  Anesthesia Plan: General ETT   Post-op Pain Management:    Induction:   PONV Risk Score and Plan: Ondansetron, Dexamethasone and Treatment may vary due to age or medical condition  Airway Management Planned:   Additional Equipment:   Intra-op Plan:   Post-operative Plan:   Informed Consent: I have reviewed the patients History and Physical, chart, labs and discussed the procedure including the risks, benefits and alternatives for the proposed anesthesia with the patient or authorized representative who has indicated his/her understanding and acceptance.     Dental Advisory Given  Plan Discussed with: Anesthesiologist, CRNA and Surgeon  Anesthesia Plan Comments:        Anesthesia Quick Evaluation

## 2020-06-28 NOTE — Progress Notes (Signed)
Per Dr. Patsey Berthold verbally- order PET, MRI Aaron Edelman and oncology referral.  Order has been placed.

## 2020-06-28 NOTE — Op Note (Signed)
Electromagnetic Navigation Bronchoscopy Cellvizio probe based confocal laser endomicroscopy (pCLE) Fluoroscopy  Indication: lung mass RLL  Preoperative Diagnosis: Right lower lobe mass, rule out CA Post Procedure Diagnosis: Same as above  Consent: Verbal/Written  Benefits, limitations and potential complications of the procedure were discussed with the patient/family  including, but not limited to bleeding, hemoptysis, respiratory failure requiring intubation and/or prolongued mechanical ventilation, infection, pneumothorax (collapse of lung) requiring chest tube placement, stroke or even death.  Patient agreed to proceed.  Surgeon: Renold Don, MD Assistant/Scrub: Frederich Cha, RRT Anesthesiologist/CRNA: Derwood Kaplan, MD/Kimberly Altamese Rhodell, CRNA Cath Kalman Shan available?:  Yes, [facility] Fluoroscopy technician: Waverly Ferrari, RTR  Type of Anesthesia: General endotracheal.   Description of Procedure: Procedure Performed: 1) virtual Bronchoscopy with Multi-planar Image analysis, 3-D reconstruction of coronal, sagittal and multi-planar images for the purposes of planning real-time bronchoscopy using the iLogic Electromagnetic Navigation Bronchoscopy System (superDimension). 2) confocal laser endomicroscopy and fluoroscopy as needed.  Description of Procedure: The patient was taken to Procedure Room 2 (Bronchoscopy Suite) appropriate timeout was taken.  Patient was placed on the superDimension table.  Patient was then inducted under general anesthesia by the anesthesia team.  She was intubated with an 8.5 ETT without difficulty.  A Portex adapter was placed on the ETT flange.  At this point the Olympus video bronchoscope was advanced through the Portex adapter and anatomic tour of the airway was performed.  Patient had copious secretions throughout that were suctioned and lavaged until clear.  The visible trachea was normal, carina was sharp, inspection of the right lung showed no abnormalities or  endobronchial lesions on the right upper lobe, right middle lobe and right lower lobe subsegments.  Inspection of the left lung showed no endobronchial lesions, some airway friability on left upper lobe, lingula and lower lobe subsegments.  At this point the superDimension extended working channel and locatable guide (LG) were advanced through the working channel of the bronchoscope.The catheter/LG combo was then placed into the central portion of the trachea. The camera was directed to standard registration points at the following centers: main carina, right upper lobe bronchus, right lower lobe bronchus, right middle lobe bronchus, left upper lobe bronchus, and the left lower lobe bronchus. This data was transferred to the i-Logic ENB system for real-time bronchoscopy.  Once registration was completed, the scope was advanced until I could go no further and then the extended working channel/LG combo was navigated to the RIGHT lower lobe for tissue sampling.  The position of the LG was confirmed with fluoroscopy.  At this point the LG was removed and the Cellvizio endomicroscope probe was advanced through the extended working channel and abnormal tissue was confirmed.  Once this was performed, the Endomicroscope probe was removed and and transbronchial brushings were performed.  ROSE revealed lesional cells, additional brushings were performed a total of 3 brushings were performed.  In similar fashion transbronchial biopsies were performed with ConMed Precisor biopsy forceps.  A total of 8 biopsy specimens were obtained.  Once this was completed a superDimension GenCut  tool was then utilized to obtain more tissue of the same lesion.  3 passes with the GenCut tool were performed (sample labeled as transbronchial needle aspirate).  After this was completed a targeted bronchoalveolar lavage was performed on the left lower lobe, 40 mL of saline were instilled yielding approximately 10 mL of aliquot.  Once this was  completed the airway was examined for hemostasis.  Airway was lavaged till clear.  Having completed this portion  of the procedure there was minimal heme noted on the right lower lobe bronchus, this was lavaged until clear.  Patient then received 9 mL of 1% lidocaine via bronchial lavage and the bronchoscope was retrieved.  The procedure was at this point terminated.  Patient was allowed to emerge from general anesthesia and was transferred to the PACU in satisfactory condition.  Patient tolerated procedure very well.  Specimens Obtained:  Transbronchial Fine Needle Aspirations GenCut: X3  Transbronchial Forceps Biopsy: X8  Transbronchial  Brush: X3  Targeted Bronchoalveolar Lavage: 40 mL in, approximately 10 mL aliquot out  Fluoroscopy:  Fluoroscopy was utilized during the course of this procedure to assure that biopsies were taken in a safe manner under fluoroscopic guidance with spot films required.  Fluoroscopy time: 2 minutes.  Intraprocedural images:       Complications:None.  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 Aurelio Brash, MD [place]   *This note was dictated using voice recognition software/Dragon.  Despite best efforts to proofread, errors can occur which can change the meaning.  Any change was purely unintentional.

## 2020-06-28 NOTE — Interval H&P Note (Signed)
History and Physical Interval Note:  06/28/2020 12:07 PM  Christine Burnett  has presented today for surgery, with the diagnosis of RIGHT LOWER LOBE LUNG MASS.  The various methods of treatment have been discussed with the patient and family. After consideration of risks, benefits and other options for treatment, the patient has consented to  Procedure(s): [place] (Right) as a surgical intervention.  The patient's history has been reviewed, patient examined, no change in status, stable for surgery.  I have reviewed the patient's chart and labs.  Questions were answered to the patient's satisfaction.     Vernard Gambles

## 2020-06-29 ENCOUNTER — Encounter: Payer: Self-pay | Admitting: Pulmonary Disease

## 2020-06-29 NOTE — Transfer of Care (Signed)
Immediate Anesthesia Transfer of Care Note  Patient: Christine Burnett  Procedure(s) Performed: VIDEO BRONCHOSCOPY WITH ENDOBRONCHIAL NAVIGATION CELLVIZIO (Right )  Patient Location: PACU  Anesthesia Type:General  Level of Consciousness: drowsy  Airway & Oxygen Therapy: Patient Spontanous Breathing and Patient connected to nasal cannula oxygen  Post-op Assessment: Report given to RN and Post -op Vital signs reviewed and stable  Post vital signs: Reviewed and stable  Last Vitals:  Vitals Value Taken Time  BP    Temp    Pulse    Resp    SpO2      Last Pain:  Vitals:   06/28/20 1439  TempSrc: Temporal  PainSc: 0-No pain      Patients Stated Pain Goal: 0 (09/98/33 8250)  Complications: No complications documented.

## 2020-06-29 NOTE — Anesthesia Procedure Notes (Signed)
Procedure Name: Intubation Date/Time: 06/28/2020 12:38 PM Performed by: Allean Found, CRNA Pre-anesthesia Checklist: Patient identified, Patient being monitored, Timeout performed, Emergency Drugs available and Suction available Patient Re-evaluated:Patient Re-evaluated prior to induction Oxygen Delivery Method: Circle system utilized Preoxygenation: Pre-oxygenation with 100% oxygen Induction Type: IV induction Ventilation: Mask ventilation without difficulty Laryngoscope Size: 3 and McGraph Grade View: Grade I Tube type: Oral Tube size: 8.5 mm Number of attempts: 1 Airway Equipment and Method: Stylet Placement Confirmation: ETT inserted through vocal cords under direct vision,  positive ETCO2 and breath sounds checked- equal and bilateral Secured at: 21 cm Tube secured with: Tape Dental Injury: Teeth and Oropharynx as per pre-operative assessment  Difficulty Due To: Difficult Airway- due to anterior larynx and Difficult Airway- due to dentition Comments: Prominent top front teeth

## 2020-06-29 NOTE — Anesthesia Postprocedure Evaluation (Signed)
Anesthesia Post Note  Patient: DELONDA COLEY  Procedure(s) Performed: VIDEO BRONCHOSCOPY WITH ENDOBRONCHIAL NAVIGATION CELLVIZIO (Right )  Patient location during evaluation: PACU Anesthesia Type: General Level of consciousness: awake and alert Pain management: pain level controlled Vital Signs Assessment: post-procedure vital signs reviewed and stable Respiratory status: spontaneous breathing, nonlabored ventilation, respiratory function stable and patient connected to nasal cannula oxygen Cardiovascular status: blood pressure returned to baseline and stable Postop Assessment: no apparent nausea or vomiting Anesthetic complications: no   No complications documented.   Last Vitals:  Vitals:   06/28/20 1425 06/28/20 1439  BP:  111/69  Pulse:  88  Resp:  14  Temp: 36.6 C 36.4 C  SpO2:  94%    Last Pain:  Vitals:   06/28/20 1439  TempSrc: Temporal  PainSc: 0-No pain                 Arita Miss

## 2020-06-30 LAB — CYTOLOGY - NON PAP

## 2020-06-30 LAB — SURGICAL PATHOLOGY

## 2020-07-01 ENCOUNTER — Encounter: Payer: Self-pay | Admitting: *Deleted

## 2020-07-01 NOTE — Progress Notes (Signed)
  Oncology Nurse Navigator Documentation  Navigator Location: [facility]Med [facility]c (07/01/20 1400) Referral Date to RadOnc/MedOnc: 06/28/20 (07/01/20 1400) )Navigator Encounter Type: Introductory Phone Call (07/01/20 1400)   Abnormal Finding Date: 05/21/20 (07/01/20 1400) Confirmed Diagnosis Date: 06/30/20 (07/01/20 1400)                   Barriers/Navigation Needs: Coordination of Care (07/01/20 1400)   Interventions: Coordination of Care (07/01/20 1400)   Coordination of Care: Appts (07/01/20 1400)       phone call made to patient to review upcoming appts. All questions answered during call. Pt declined first appt given for 9am and requested later appt. Pt's appt adjusted to 10:45am to see Dr. Janese Banks. Pt verbalized understanding. Nothing further needed at this time. Will follow up with pt at appt tomorrow morning.            Time Spent with Patient: 30 (07/01/20 1400)

## 2020-07-02 ENCOUNTER — Inpatient Hospital Stay: Payer: Medicare Other | Attending: Oncology | Admitting: Oncology

## 2020-07-02 ENCOUNTER — Other Ambulatory Visit: Payer: Self-pay

## 2020-07-02 ENCOUNTER — Inpatient Hospital Stay: Payer: Medicare Other

## 2020-07-02 ENCOUNTER — Encounter: Payer: Self-pay | Admitting: Oncology

## 2020-07-02 ENCOUNTER — Encounter: Payer: Self-pay | Admitting: *Deleted

## 2020-07-02 VITALS — BP 138/74 | HR 83 | Temp 97.7°F | Resp 16 | Ht 67.0 in | Wt 148.0 lb

## 2020-07-02 DIAGNOSIS — C3431 Malignant neoplasm of lower lobe, right bronchus or lung: Secondary | ICD-10-CM | POA: Diagnosis not present

## 2020-07-02 DIAGNOSIS — J1282 Pneumonia due to coronavirus disease 2019: Secondary | ICD-10-CM

## 2020-07-02 DIAGNOSIS — C3490 Malignant neoplasm of unspecified part of unspecified bronchus or lung: Secondary | ICD-10-CM | POA: Diagnosis not present

## 2020-07-02 LAB — CYTOLOGY - NON PAP

## 2020-07-02 NOTE — Research (Signed)
[facility] Consent and Visit Note:   Trial: [facility]  Patient Christine Burnett was identified by Jeral Fruit, RN, research nurse, as a potential candidate for the above listed study.  This Clinical Research nurse met with Christine Burnett, [id number], on 07/02/20 in a manner and location that ensures patient privacy to discuss participation in the above listed research study.  Patient is by her daughter today.   A copy of the informed consent document [facility] version 5 dated 05/21/2019 was provided to the patient.  Patient read, speaks, and understands [place].    Patient was provided with the business card of this research nurse and Mauricio Po, CPhT research specialist, and encouraged to contact the research team with any questions.  Patient was provided the option of taking informed consent documents home to review and was encouraged to review at their convenience with their support network, including other care providers. The patient does not wish to take the consent home today for review.  As outlined in the informed consent form, this research nurse and [name] discussed the purpose of the research study, the investigational nature of the study, study procedures and requirements for study participation, potential risks and benefits of study participation, as well as alternatives to participation.  The patient understands participation is voluntary and they may withdraw from study participation at any time. The patient understands this will be a one time venipuncture for the study with her scheduled lab appointment to avoid multiple visits and procedures. She was informed that the study only requires two vials of blood with approximately 8 ml per vial.  Patient understands enrollment is pending full eligibility review, second review of eligibility performed by Carol Ada, research coordinator. The patient was found to be eligible to participate.    Confidentiality and how the patient's information will be used as part of study participation were discussed.  Patient was informed there is reimbursement provided for their time and effort spent on trial participation in the amount of a $50.00 gift card provided by [facility].  The patient is encouraged to discuss research study participation with their insurance provider to determine what costs they may incur as part of study participation, including research related injury.    All questions were answered to patient's satisfaction.  The informed consent [facility]  was reviewed page by page.  The patient's mental and emotional status is appropriate to provide informed consent, and the patient verbalizes an understanding of study participation.  Patient has agreed to participate in the above listed research study and has voluntarily signed the informed consent version 5 dated 05/21/2019 on 07/02/20 at 1140 am.  The patient was provided with a copy of the signed informed consent form and [employer]  for their reference.  No study specific procedures were obtained prior to the signing of the informed consent document.  Approximately 30 minutes were spent with the patient reviewing the informed consent documents.  Release of information was signed by the patient.   The patient was escorted to the lab for her scheduled appointment by Trinda Pascal, oncology navigator and [place] after patient signed her consent. Mauricio Po, [place] gave the patient her gift card with explanation as to how to activate it and expiration date and received signed receipt from the patient.  The patient was thanked for her participation in clinical trials, she states she may participate in more in the future if she's eligible. Jeral Fruit, RN, BSN,  OCN 07/02/20 12:10 PM

## 2020-07-02 NOTE — Progress Notes (Signed)
  Oncology Nurse Navigator Documentation  Navigator Location: [facility] (07/02/20 1300)   )Navigator Encounter Type: Initial MedOnc (07/02/20 1300)                       Treatment Phase: Pre-Tx/Tx Discussion (07/02/20 1300) Barriers/Navigation Needs: Coordination of Care;Education (07/02/20 1300) Education: Newly Diagnosed Cancer Education;Understanding Cancer/ Treatment Options (07/02/20 1300) Interventions: Coordination of Care;Referrals (07/02/20 1300) Referrals: Radiation Oncology (07/02/20 1300) Coordination of Care: Appts;Radiology (07/02/20 1300)        Acuity: Level 2-Minimal Needs (1-2 Barriers Identified) (07/02/20 1300)     met with patient and her daughter during initial consult with Dr. Janese Banks. All questions answered during visit. Pt given resources regarding diagnosis and supportive services available. Reviewed upcoming appts. Contact info given and instructed to call with any further questions or needs. Pt verbalized understanding.      Time Spent with Patient: 60 (07/02/20 1300)

## 2020-07-02 NOTE — Progress Notes (Signed)
New pt for NSCLC. Patient is with her daughter.

## 2020-07-04 ENCOUNTER — Encounter: Payer: Self-pay | Admitting: Oncology

## 2020-07-04 DIAGNOSIS — C3431 Malignant neoplasm of lower lobe, right bronchus or lung: Secondary | ICD-10-CM | POA: Insufficient documentation

## 2020-07-04 NOTE — Progress Notes (Signed)
Hematology/Oncology Consult note [facility] [phone] [phone]  Patient Care Team: Delsa Grana, PA-C as PCP - General (Family Medicine) Kate Sable, MD as PCP - Cardiology (Cardiology) Dagoberto Ligas, MD as Consulting Physician (Internal Medicine) Anthonette Legato, MD as Consulting Physician (Nephrology) Telford Nab, RN as Oncology Nurse Navigator   Name of the patient: Christine Burnett  [id number]  05-22-1941    Reason for referral-new diagnosis of non-small cell lung cancer   Referring physician-Dr. Patsey Berthold  Date of visit: 07/04/20   History of presenting illness- Patient is a 80 year old female with a past medical history significant for hyperlipidemia.  She was admitted to the hospital in October 2021 for Covid and was found to have a masslike opacity in the right infrahilar lung.  At that point a repeat chest x-ray in 6 weeks was recommended.  X-ray in November 2021 showed persistent round opacity in the right lower lobe which was followed up with a CT chest without contrast.  It showed a 4 x 3.5 cm lobulated and spiculated mass in the right lower lobe surrounding the right lower lobe bronchial tree.  Associated soft tissue within the bronchial tree which may represent local ingrowth.  This was followed by a bronchoscopy and bronchoalveolar lavage from the right lower lobe was consistent with non-small cell lung cancer.  Neoplastic cells were negative for p40 TTF-1 and CD56.  Additional immunostains are currently pending.  Lymph node sampling was not performed at the time of bronchoscopy.  Patient is doing well for her age and is independent of her ADLs.  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  ECOG PS- 1  Pain scale- 0   Review of systems- Review of Systems  Constitutional: Positive for malaise/fatigue. Negative for chills, fever and weight loss.  HENT: Negative for congestion, ear  discharge and nosebleeds.   Eyes: Negative for blurred vision.  Respiratory: Negative for cough, hemoptysis, sputum production, shortness of breath and wheezing.   Cardiovascular: Negative for chest pain, palpitations, orthopnea and claudication.  Gastrointestinal: Negative for abdominal pain, blood in stool, constipation, diarrhea, heartburn, melena, nausea and vomiting.  Genitourinary: Negative for dysuria, flank pain, frequency, hematuria and urgency.  Musculoskeletal: Negative for back pain, joint pain and myalgias.  Skin: Negative for rash.  Neurological: Negative for dizziness, tingling, focal weakness, seizures, weakness and headaches.  Endo/Heme/Allergies: Does not bruise/bleed easily.  Psychiatric/Behavioral: Negative for depression and suicidal ideas. The patient does not have insomnia.     Allergies  Allergen Reactions  . Cefuroxime Axetil     caused rash.  . Ciprofloxacin Nausea Only    Unknown  . Codeine Other (See Comments)    Patient does not like the way this makes her feel    Patient Active Problem List   Diagnosis Date Noted  . Pneumonia due to COVID-19 virus 03/17/2020  . Hypoxia 03/17/2020  . Prediabetes 08/13/2018  . Major depressive disorder, recurrent ([place]) 06/13/2018  . Allergic rhinitis 09/27/2017  . 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 Stage 3b chronic kidney disease ([place]) 08/31/2017  . 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 Osteoporosis without current pathological fracture 03/30/2017  . Cardiac murmur 04/07/2015  . 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 Anxiety 01/05/2015  . Hyperlipidemia 01/05/2015  . HTN (hypertension) 09/24/2014     Past Medical History:  Diagnosis Date  . Allergic rhinitis   .  Anxiety   . Chronic bronchitis ([place])   . Chronic kidney disease, stage III (moderate) ([place]) 08/31/2017   Seeing nephrologist   . CKD (chronic kidney disease), stage III ([place])   . COPD (chronic obstructive pulmonary disease) ([place])   . Cornea scar   . Depression    unspecified  . Diverticulosis   . Hx of fracture of left hip 12/24/2017  . Hyperlipidemia   . 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 Non-small cell lung cancer (NSCLC) ([place])   . Osteoporosis   . Risk for falls   . Situational disturbance   . Stroke ([place]) 2011   tia's x 2  . TIA (transient ischemic attack)      Past Surgical History:  Procedure Laterality Date  . COLONOSCOPY  2016  . ESOPHAGUS SURGERY    . INTRAMEDULLARY (IM) NAIL INTERTROCHANTERIC Left 03/26/2017   Procedure: INTRAMEDULLARY (IM) NAIL INTERTROCHANTRIC;  Surgeon: Hessie Knows, MD;  Location: [facility] ORS;  Service: Orthopedics;  Laterality: Left;  Marland Kitchen VIDEO BRONCHOSCOPY WITH ENDOBRONCHIAL NAVIGATION Right 06/28/2020   Procedure: VIDEO BRONCHOSCOPY WITH ENDOBRONCHIAL NAVIGATION CELLVIZIO;  Surgeon: Tyler Pita, MD;  Location: [facility] ORS;  Service: Pulmonary;  Laterality: Right;    Social History   Socioeconomic History  . Marital status: Divorced    Spouse name: Not on file  . Number of children: 4  . Years of education: Not on file  . Highest education level: 12th grade  Occupational History  . Occupation: Retired    Comment: Occupational hygienist  . Smoking status: Former Smoker    Packs/day: 1.00    Years: 61.00    Pack years: 61.00    Types: Cigarettes    Quit date: 03/25/2017    Years since quitting: 3.2  . Smokeless tobacco: Never Used  Vaping Use  . Vaping Use: Never used  Substance and Sexual Activity  . Alcohol use: No  . Drug use: No  . Sexual activity: Not Currently  Other Topics Concern  . Not on file  Social History Narrative   Full Code   4 children   Denies alcohol use   Patient has 2 daughters and 1 grandson living with her   Social Determinants of Health   Financial Resource Strain: Low Risk    . Difficulty of Paying Living Expenses: Not hard at all  Food Insecurity: No Food Insecurity  . Worried About Charity fundraiser in the Last Year: Never true  . Ran Out of Food in the Last Year: Never true  Transportation Needs: No Transportation Needs  . Lack of Transportation (Medical): No  . Lack of Transportation (Non-Medical): No  Physical Activity: Inactive  . Days of Exercise per Week: 0 days  . Minutes of Exercise per Session: 0 min  Stress: Stress Concern Present  . Feeling of Stress : Rather much  Social Connections: Moderately Isolated  . Frequency of Communication with Friends and Family: More than three times a week  . Frequency of Social Gatherings with Friends and Family: More than three times a week  . Attends Religious Services: More than 4 times per year  . Active Member of Clubs or Organizations: No  . Attends Archivist Meetings: Never  . Marital Status: Divorced  Human resources officer Violence: Not At Risk  . Fear of Current or Ex-Partner: No  . Emotionally Abused: No  . Physically Abused: No  . Sexually Abused: No     Family History  Problem Relation Age of Onset  . Other Mother        [place] posioning  . Heart attack Father   . Prostate cancer Father   . Heart attack Brother   . Diabetes Brother   . Hypertension Daughter   . Kidney cancer Neg Hx   . Bladder Cancer Neg Hx   . Colon cancer Neg Hx   . Colon polyps Neg Hx   . Rectal cancer Neg Hx      Current Outpatient Medications:  .  acetaminophen (TYLENOL) 650 MG CR tablet, Take 1 tablet (650 mg total) by mouth 4 (four) times daily as needed for pain., Disp: , Rfl:  .  albuterol (VENTOLIN HFA) 108 (90 Base) MCG/ACT inhaler, Inhale 2 puffs into the lungs every 6 (six) hours as needed for wheezing or shortness of breath., Disp: 1 each, Rfl: 0 .  aspirin 81 MG EC tablet, Take 81 mg by mouth daily. Swallow whole., Disp: , Rfl:  .  atorvastatin (LIPITOR) 10 MG tablet, Take 1 tablet (10 mg  total) by mouth at bedtime., Disp: 90 tablet, Rfl: 3 .  Cholecalciferol (VITAMIN D-3) 1000 units CAPS, Take 2,000 Units by mouth daily. Takes occassionally, Disp: , Rfl:  .  melatonin 3 MG TABS tablet, Take 3 mg by mouth at bedtime., Disp: , Rfl:  .  PARoxetine (PAXIL) 40 MG tablet, Take 1 tablet (40 mg total) by mouth every morning., Disp: 90 tablet, Rfl: 3 .  Probiotic Product (PROBIOTIC-10 PO), Take 1 capsule by mouth daily., Disp: , Rfl:    Physical exam:  Vitals:   07/02/20 1057  BP: 138/74  Pulse: 83  Resp: 16  Temp: 97.7 F (36.5 C)  TempSrc: Oral  Weight: 148 lb (67.1 kg)  Height: _0  (1.702 m)   Physical Exam Constitutional:      General: She is not in acute distress. Eyes:     Extraocular Movements: EOM normal.     Pupils: Pupils are equal, round, and reactive to light.  Cardiovascular:     Rate and Rhythm: Normal rate and regular rhythm.     Heart sounds: Normal heart sounds.  Pulmonary:     Effort: Pulmonary effort is normal.     Breath sounds: Normal breath sounds.  Abdominal:     General: Bowel sounds are normal.     Palpations: Abdomen is soft.  Skin:    General: Skin is warm and dry.  Neurological:     Mental Status: She is alert and oriented to person, place, and time.        CMP Latest Ref Rng & Units 05/03/2020  Glucose 65 - 99 mg/dL 98  BUN 7 - 25 mg/dL 25  Creatinine 0.60 - 0.88 mg/dL 1.54(H)  Sodium 135 - 146 mmol/L 140  Potassium 3.5 - 5.3 mmol/L 5.0  Chloride 98 - 110 mmol/L 105  CO2 20 - 32 mmol/L 25  Calcium 8.6 - 10.4 mg/dL 10.1  Total Protein 6.1 - 8.1 g/dL 7.4  Total Bilirubin 0.2 - 1.2 mg/dL 0.5  Alkaline Phos 38 - 126 U/L -  AST 10 - 35 U/L 15  ALT 6 - 29 U/L 11   CBC Latest Ref Rng & Units 05/03/2020  WBC 3.8 - 10.8 Thousand/uL 6.6  Hemoglobin 11.7 - 15.5 g/dL 12.6  Hematocrit 35.0 - 45.0 % 37.9  Platelets 140 - 400 Thousand/uL 300    No images are attached to the encounter.  DG Chest [facility] 1 [address]  Result Date:  06/28/2020 CLINICAL DATA:  Known right lower lobe mass, status post biopsy EXAM: PORTABLE CHEST 1 VIEW COMPARISON:  05/03/2020 FINDINGS: Cardiac shadow is within normal limits. Aortic calcifications are noted. Persistent right lower lobe mass lesion is again seen. No pneumothorax is noted following biopsy. No new focal abnormality is seen. IMPRESSION: No pneumothorax following right lower lobe lung biopsy. Electronically Signed   By: Inez Catalina M.D.   On: 06/28/2020 13:52   DG C-Arm 1-60 Min-No Report  Result Date: 06/28/2020 Fluoroscopy was utilized by the requesting physician.  No radiographic interpretation.    Assessment and plan- Patient is a 80 y.o. 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  PET CT scan will also determine if there are any areas of suspicious hilar or mediastinal adenopathy.  If patient does not have any evidence of locoregional lymph node involvement or distant metastatic disease she potentially has stage Ib lung cancer with T2 a disease.  Patient is not interested in surgery is an option.  For stage I lung cancer she could be potentially treated with radiation treatment alone without chemotherapy.  If she has evidence of lymph node involvement this would make currently stage III which would be treated with concurrent chemoradiation.  I will discuss her case at tumor board after PET CT scan is performed and await additional staining to determine if this is adenocarcinoma versus squamous cell carcinoma.  I will see the patient thereafter along with radiation oncology and discuss further management.  Patient and her daughter had multiple insightful questions and all of them were answered to their satisfaction   Thank you for this kind referral and the opportunity to participate in the care of this  Patient   Visit  Diagnosis No diagnosis found.  Dr. Randa Evens, MD, MPH [facility] at [facility] [phone] 07/04/2020

## 2020-07-07 ENCOUNTER — Ambulatory Visit: Payer: Medicare Other

## 2020-07-13 ENCOUNTER — Other Ambulatory Visit: Payer: Self-pay

## 2020-07-13 ENCOUNTER — Ambulatory Visit
Admission: RE | Admit: 2020-07-13 | Discharge: 2020-07-13 | Disposition: A | Discharge status: Home / Self Care | Payer: Medicare Other | Source: Ambulatory Visit | Attending: Pulmonary Disease | Admitting: Pulmonary Disease

## 2020-07-13 ENCOUNTER — Ambulatory Visit: Payer: Medicare Other | Admitting: Adult Health

## 2020-07-13 DIAGNOSIS — C3431 Malignant neoplasm of lower lobe, right bronchus or lung: Secondary | ICD-10-CM | POA: Insufficient documentation

## 2020-07-13 DIAGNOSIS — C3490 Malignant neoplasm of unspecified part of unspecified bronchus or lung: Secondary | ICD-10-CM | POA: Diagnosis not present

## 2020-07-13 DIAGNOSIS — R222 Localized swelling, mass and lump, trunk: Secondary | ICD-10-CM | POA: Diagnosis not present

## 2020-07-13 DIAGNOSIS — I714 Abdominal aortic aneurysm, without rupture: Secondary | ICD-10-CM | POA: Diagnosis not present

## 2020-07-13 DIAGNOSIS — I700 Atherosclerosis of aorta: Secondary | ICD-10-CM | POA: Insufficient documentation

## 2020-07-13 DIAGNOSIS — J439 Emphysema, unspecified: Secondary | ICD-10-CM | POA: Diagnosis not present

## 2020-07-13 LAB — GLUCOSE, CAPILLARY: Glucose-Capillary: 89 mg/dL (ref 70–99)

## 2020-07-13 MED ORDER — GADOBUTROL 1 MMOL/ML IV SOLN
6.0000 mL | Freq: Once | INTRAVENOUS | Status: AC | PRN
  Administered 2020-07-13: 6 mL via INTRAVENOUS

## 2020-07-13 MED ORDER — FLUDEOXYGLUCOSE F - 18 (FDG) INJECTION
7.7000 | Freq: Once | INTRAVENOUS | Status: AC | PRN
  Administered 2020-07-13: 8.24 via INTRAVENOUS

## 2020-07-15 ENCOUNTER — Encounter: Payer: Self-pay | Admitting: Oncology

## 2020-07-15 ENCOUNTER — Inpatient Hospital Stay: Payer: Medicare Other | Attending: Oncology | Admitting: Oncology

## 2020-07-15 ENCOUNTER — Encounter: Payer: Self-pay | Admitting: Radiation Oncology

## 2020-07-15 ENCOUNTER — Encounter: Payer: Self-pay | Admitting: *Deleted

## 2020-07-15 ENCOUNTER — Other Ambulatory Visit: Payer: Medicare Other

## 2020-07-15 ENCOUNTER — Ambulatory Visit
Admission: RE | Admit: 2020-07-15 | Discharge: 2020-07-15 | Disposition: A | Discharge status: Home / Self Care | Payer: Medicare Other | Source: Ambulatory Visit | Attending: Radiation Oncology | Admitting: Radiation Oncology

## 2020-07-15 VITALS — BP 107/81 | HR 82 | Temp 96.6°F | Resp 16 | Wt 146.1 lb

## 2020-07-15 VITALS — BP 107/81 | HR 92 | Temp 96.6°F | Resp 16 | Wt 146.9 lb

## 2020-07-15 DIAGNOSIS — C3491 Malignant neoplasm of unspecified part of right bronchus or lung: Secondary | ICD-10-CM | POA: Diagnosis not present

## 2020-07-15 DIAGNOSIS — Z87891 Personal history of nicotine dependence: Secondary | ICD-10-CM | POA: Insufficient documentation

## 2020-07-15 DIAGNOSIS — F418 Other specified anxiety disorders: Secondary | ICD-10-CM | POA: Insufficient documentation

## 2020-07-15 DIAGNOSIS — Z7982 Long term (current) use of aspirin: Secondary | ICD-10-CM | POA: Insufficient documentation

## 2020-07-15 DIAGNOSIS — C3432 Malignant neoplasm of lower lobe, left bronchus or lung: Secondary | ICD-10-CM | POA: Insufficient documentation

## 2020-07-15 DIAGNOSIS — I129 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 stage 4 chronic kidney disease, or unspecified chronic kidney disease: Secondary | ICD-10-CM | POA: Insufficient documentation

## 2020-07-15 DIAGNOSIS — Z8042 Family history of malignant neoplasm of prostate: Secondary | ICD-10-CM | POA: Insufficient documentation

## 2020-07-15 DIAGNOSIS — N183 Chronic kidney disease, stage 3 unspecified: Secondary | ICD-10-CM | POA: Insufficient documentation

## 2020-07-15 DIAGNOSIS — E785 Hyperlipidemia, unspecified: Secondary | ICD-10-CM | POA: Insufficient documentation

## 2020-07-15 DIAGNOSIS — J449 Chronic obstructive pulmonary disease, unspecified: Secondary | ICD-10-CM | POA: Insufficient documentation

## 2020-07-15 DIAGNOSIS — Z79899 Other long term (current) drug therapy: Secondary | ICD-10-CM | POA: Diagnosis not present

## 2020-07-15 DIAGNOSIS — Z8616 Personal history of COVID-19: Secondary | ICD-10-CM | POA: Diagnosis not present

## 2020-07-15 DIAGNOSIS — Z7189 Other specified counseling: Secondary | ICD-10-CM

## 2020-07-15 DIAGNOSIS — C3431 Malignant neoplasm of lower lobe, right bronchus or lung: Secondary | ICD-10-CM | POA: Diagnosis not present

## 2020-07-15 DIAGNOSIS — Z8673 Personal history of transient ischemic attack (TIA), and cerebral infarction without residual deficits: Secondary | ICD-10-CM | POA: Insufficient documentation

## 2020-07-15 DIAGNOSIS — M810 Age-related osteoporosis without current pathological fracture: Secondary | ICD-10-CM | POA: Insufficient documentation

## 2020-07-15 DIAGNOSIS — N2581 Secondary hyperparathyroidism of renal origin: Secondary | ICD-10-CM | POA: Insufficient documentation

## 2020-07-15 DIAGNOSIS — Z808 Family history of malignant neoplasm of other organs or systems: Secondary | ICD-10-CM | POA: Diagnosis not present

## 2020-07-15 NOTE — Consult Note (Signed)
NEW PATIENT EVALUATION  Name: Christine Burnett  MRN: [id number]  Date:   07/15/2020     DOB: 1940/06/11   This 80 y.o. female patient presents to the clinic for initial evaluation of stage Ib (T2 N0 M0).  Non-small cell lung cancer of the right lower lobe  REFERRING PHYSICIAN: Delsa Grana, PA-C  CHIEF COMPLAINT:  Chief Complaint  Patient presents with  . Lung Cancer    Initial consultation    DIAGNOSIS: The encounter diagnosis was Malignant neoplasm of lower lobe of right lung ([place]).   PREVIOUS INVESTIGATIONS:  CT scans PET CT scans reviewed Clinical notes reviewed Pathology reviewed Case presented at weekly tumor conference  HPI: Patient is an 80 year old female had a hospitalization in October for Covid and was found to have a masslike opacity in the right infrahilar lung.  Follow-up x-ray showed persistent mass.  CT scan demonstrated a 4 x 3.5 cm lobulated spiculated mass in the right lower lobe surround the right lower lobe bronchial tree.  Bronchoscopy and cytology of the right lower lobe was consistent with non-small cell lung cancer.  On PET scan tumor was hypermetabolic with no signs of mediastinal hilar or other metastatic disease noted.  Patient has declined surgery.  She is now referred to radiation oncology for consideration of treatment.  Patient is also been seen by medical oncology and based on the stage is not recommended for systemic chemotherapy.  Patient is doing well has a mild nonproductive cough no hemoptysis or chest tightness.  PLANNED TREATMENT REGIMEN: SBRT  PAST MEDICAL HISTORY:  has a past medical history of Allergic rhinitis, Anxiety, Chronic bronchitis ([place]), Chronic kidney disease, stage III (moderate) ([place]) (08/31/2017), CKD (chronic kidney disease), stage III ([place]), COPD (chronic obstructive pulmonary disease) ([place]), Cornea scar, Depression, Diverticulosis, fracture of left hip (12/24/2017), Hyperlipidemia, Hyperparathyroidism, secondary renal ([place])  (08/31/2017), Non-small cell lung cancer (NSCLC) ([place]), Osteoporosis, Risk for falls, Situational disturbance, Stroke ([place]) (2011), and TIA (transient ischemic attack).    PAST SURGICAL HISTORY:  Past Surgical History:  Procedure Laterality Date  . COLONOSCOPY  2016  . ESOPHAGUS SURGERY    . INTRAMEDULLARY (IM) NAIL INTERTROCHANTERIC Left 03/26/2017   Procedure: INTRAMEDULLARY (IM) NAIL INTERTROCHANTRIC;  Surgeon: Hessie Knows, MD;  Location: [facility] ORS;  Service: Orthopedics;  Laterality: Left;  Marland Kitchen VIDEO BRONCHOSCOPY WITH ENDOBRONCHIAL NAVIGATION Right 06/28/2020   Procedure: VIDEO BRONCHOSCOPY WITH ENDOBRONCHIAL NAVIGATION CELLVIZIO;  Surgeon: Tyler Pita, MD;  Location: [facility] ORS;  Service: Pulmonary;  Laterality: Right;    FAMILY HISTORY: family history includes Diabetes in her brother; Heart attack in her brother and father; Hypertension in her daughter; Other in her mother; Prostate cancer in her father.  SOCIAL HISTORY:  reports that she quit smoking about 3 years ago. Her smoking use included cigarettes. She has a 61.00 pack-year smoking history. She has never used smokeless tobacco. She reports that she does not drink alcohol and does not use drugs.  ALLERGIES: Cefuroxime axetil, Ciprofloxacin, and Codeine  MEDICATIONS:  Current Outpatient Medications  Medication Sig Dispense Refill  . acetaminophen (TYLENOL) 650 MG CR tablet Take 1 tablet (650 mg total) by mouth 4 (four) times daily as needed for pain.    Marland Kitchen albuterol (VENTOLIN HFA) 108 (90 Base) MCG/ACT inhaler Inhale 2 puffs into the lungs every 6 (six) hours as needed for wheezing or shortness of breath. 1 each 0  . aspirin 81 MG EC tablet Take 81 mg by mouth daily. Swallow whole.    Marland Kitchen atorvastatin (LIPITOR) 10  MG tablet Take 1 tablet (10 mg total) by mouth at bedtime. 90 tablet 3  . Cholecalciferol (VITAMIN D-3) 1000 units CAPS Take 2,000 Units by mouth daily. Takes occassionally    . melatonin 3 MG TABS tablet Take 3 mg by  mouth at bedtime.    Marland Kitchen PARoxetine (PAXIL) 40 MG tablet Take 1 tablet (40 mg total) by mouth every morning. 90 tablet 3  . Probiotic Product (PROBIOTIC-10 PO) Take 1 capsule by mouth daily.     No current facility-administered medications for this encounter.    ECOG PERFORMANCE STATUS:  0 - Asymptomatic  REVIEW OF SYSTEMS: Patient denies any weight loss, fatigue, weakness, fever, chills or night sweats. Patient denies any loss of vision, blurred vision. Patient denies any ringing  of the ears or hearing loss. No irregular heartbeat. Patient denies heart murmur or history of fainting. Patient denies any chest pain or pain radiating to her upper extremities. Patient denies any shortness of breath, difficulty breathing at night, cough or hemoptysis. Patient denies any swelling in the lower legs. Patient denies any nausea vomiting, vomiting of blood, or coffee ground material in the vomitus. Patient denies any stomach pain. Patient states has had normal bowel movements no significant constipation or diarrhea. Patient denies any dysuria, hematuria or significant nocturia. Patient denies any problems walking, swelling in the joints or loss of balance. Patient denies any skin changes, loss of hair or loss of weight. Patient denies any excessive worrying or anxiety or significant depression. Patient denies any problems with insomnia. Patient denies excessive thirst, polyuria, polydipsia. Patient denies any swollen glands, patient denies easy bruising or easy bleeding. Patient denies any recent infections, allergies or URI. Patient "s visual fields have not changed significantly in recent time.   PHYSICAL EXAM: BP 107/81 (BP Location: Right Arm, Patient Position: Sitting)   Pulse 92   Temp (!) 96.6 F (35.9 C) (Tympanic)   Resp 16   Wt 146 lb 14.4 oz (66.6 kg)   BMI 23.01 kg/m  Well-developed well-nourished patient in NAD. HEENT reveals PERLA, EOMI, discs not visualized.  Oral cavity is clear. No oral  mucosal lesions are identified. Neck is clear without evidence of cervical or supraclavicular adenopathy. Lungs are clear to A&P. Cardiac examination is essentially unremarkable with regular rate and rhythm without murmur rub or thrill. Abdomen is benign with no organomegaly or masses noted. Motor sensory and DTR levels are equal and symmetric in the upper and lower extremities. Cranial nerves II through XII are grossly intact. Proprioception is intact. No peripheral adenopathy or edema is identified. No motor or sensory levels are noted. Crude visual fields are within normal range.  LABORATORY DATA: Pathology cytology report reviewed    RADIOLOGY RESULTS: CT scans PET CT scans reviewed compatible with above-stated findings   IMPRESSION: Stage Ib non-small cell lung cancer of the right lower lobe in 80 year old female  PLAN: This time I believe we can go ahead with SBRT to her right lower lobe I would plan on delivering 60 Gray in 5 fractions.  Would use 4-dimensional treatment planning and motion restriction forced our CT simulation.  Risks and benefits of treatment including possible fatigue possible development of cough loss of normal lung volume all were described in detail to the patient and her daughter.  They both seem to comprehend my treatment plan well.  I have personally set up and ordered CT simulation for next week.  I would like to take this opportunity to thank you for allowing me  to participate in the care of your patient.Noreene Filbert, MD

## 2020-07-15 NOTE — Progress Notes (Signed)
Tumor Board Documentation  Christine Burnett was presented by Dr Janese Banks at our Tumor Board on 07/15/2020, which included representatives from medical oncology,radiation oncology,surgical,radiology,pathology,navigation,internal medicine,genetics,research,palliative care.  Christine Burnett currently presents as a new patient,for new positive pathology,for [place] with history of the following treatments: surgical intervention(s),active survellience.  Additionally, we reviewed previous medical and familial history, history of present illness, and recent lab results along with all available histopathologic and imaging studies. The tumor board considered available treatment options and made the following recommendations: Radiation therapy (primary modality)    The following procedures/referrals were also placed: No orders of the defined types were placed in this encounter.   Clinical Trial Status: not discussed   Staging used: AJCC Stage Group  AJCC Staging: T: 2b N: 0 M: x Group: Stage II Non Small Cell Lung Cancer   National site-specific guidelines NCCN were discussed with respect to the case.  Tumor board is a meeting of clinicians from various specialty areas who evaluate and discuss patients for whom a multidisciplinary approach is being considered. Final determinations in the plan of care are those of the provider(s). The responsibility for follow up of recommendations given during tumor board is that of the provider.   Today's extended care, comprehensive team conference, Christine Burnett was not present for the discussion and was not examined.   Multidisciplinary Tumor Board is a multidisciplinary case peer review process.  Decisions discussed in the Multidisciplinary Tumor Board reflect the opinions of the specialists present at the conference without having examined the patient.  Ultimately, treatment and diagnostic decisions rest with the primary provider(s) and the patient.

## 2020-07-15 NOTE — Progress Notes (Signed)
Patient assessed in Radiology.

## 2020-07-16 ENCOUNTER — Encounter: Payer: Self-pay | Admitting: Pulmonary Disease

## 2020-07-16 NOTE — Progress Notes (Signed)
  Oncology Nurse Navigator Documentation  Navigator Location: [facility] Onc (07/16/20 1000)   )Navigator Encounter Type: Follow-up Appt;Initial RadOnc (07/16/20 1000)                         Barriers/Navigation Needs: Coordination of Care (07/16/20 1000)   Interventions: Coordination of Care (07/16/20 1000)   Coordination of Care: Appts;Radiology (07/16/20 1000)       met with patient and her daughter during initial consult with Dr. Baruch Gouty and follow up visit with Dr. Janese Banks to discuss recent imaging and finalize treatment plan. All questions answered during visit. Reviewed upcoming appts and informed will be given print out of appts at next visit on 2/15. Instructed pt to call with any further questions or needs. Pt verbalized understanding.            Time Spent with Patient: 90 (07/16/20 1000)

## 2020-07-17 DIAGNOSIS — Z7189 Other specified counseling: Secondary | ICD-10-CM | POA: Insufficient documentation

## 2020-07-17 NOTE — Progress Notes (Signed)
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  She was admitted to the hospital in October 2021 for Covid and was found to have a masslike opacity in the right infrahilar lung.  At that point a repeat chest x-ray in 6 weeks was recommended.  X-ray in November 2021 showed persistent round opacity in the right lower lobe which was followed up with a CT chest without contrast.  It showed a 4 x 3.5 cm lobulated and spiculated mass in the right lower lobe surrounding the right lower lobe bronchial tree.  Associated soft tissue within the bronchial tree which may represent local ingrowth.  This was followed by a bronchoscopy and bronchoalveolar lavage from the right lower lobe was consistent with non-small cell lung cancer.  Neoplastic cells were negative for p40 TTF-1 and CD56.    Further characterization between squamous and adenocarcinoma could not be obtained.  PET CT scan showed a 4.2 cm right lower lobe lung mass with an SUV of 10.  No hypermetabolic hilar or mediastinal adenopathy.  No evidence of distant metastatic disease.  Tiny nodule at the minor fissure of the right chest potential on follow-up.  Mild aneurysmal abdominal aorta recommend follow-up every 3 years.  Interval  history-patient is doing well and denies any new complaints at this time  ECOG PS- 1 Pain scale- 0   Review of systems- Review of Systems  Constitutional: Negative for chills, fever, malaise/fatigue and weight loss.  HENT: Negative for congestion, ear discharge and nosebleeds.   Eyes: Negative for blurred vision.  Respiratory: Negative for cough, hemoptysis, sputum production, shortness of breath and wheezing.   Cardiovascular: Negative for chest pain, palpitations, orthopnea and claudication.  Gastrointestinal: Negative for abdominal pain, blood in stool, constipation, diarrhea, heartburn, melena, nausea and vomiting.  Genitourinary: Negative for dysuria, flank pain, frequency, hematuria and urgency.  Musculoskeletal: Negative for back pain, joint pain and myalgias.  Skin: Negative for rash.  Neurological: Negative for dizziness, tingling, focal weakness, seizures, weakness and headaches.  Endo/Heme/Allergies: Does not bruise/bleed easily.  Psychiatric/Behavioral: Negative for depression and suicidal ideas. The patient does not have insomnia.        Allergies  Allergen Reactions  . Cefuroxime Axetil     caused rash.  . Ciprofloxacin Nausea Only    Unknown  . Codeine Other (See Comments)    Patient does not like the way this makes her feel     Past Medical History:  Diagnosis Date  . Allergic rhinitis   . Anxiety   . Chronic bronchitis ([place])   . Chronic kidney disease, stage III (moderate) ([place]) 08/31/2017   Seeing nephrologist  . CKD (chronic kidney disease), stage III ([place])   . COPD (chronic obstructive pulmonary disease) ([place])   . Cornea scar   . Depression    unspecified  . Diverticulosis   . Hx of fracture of left  hip 12/24/2017  . Hyperlipidemia   . 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 Non-small cell lung cancer (NSCLC) ([place])   . Osteoporosis   . Risk for falls   . Situational disturbance   . Stroke ([place]) 2011    tia's x 2  . TIA (transient ischemic attack)      Past Surgical History:  Procedure Laterality Date  . COLONOSCOPY  2016  . ESOPHAGUS SURGERY    . INTRAMEDULLARY (IM) NAIL INTERTROCHANTERIC Left 03/26/2017   Procedure: INTRAMEDULLARY (IM) NAIL INTERTROCHANTRIC;  Surgeon: Hessie Knows, MD;  Location: [facility] ORS;  Service: Orthopedics;  Laterality: Left;  Marland Kitchen VIDEO BRONCHOSCOPY WITH ENDOBRONCHIAL NAVIGATION Right 06/28/2020   Procedure: VIDEO BRONCHOSCOPY WITH ENDOBRONCHIAL NAVIGATION CELLVIZIO;  Surgeon: Tyler Pita, MD;  Location: [facility] ORS;  Service: Pulmonary;  Laterality: Right;    Social History   Socioeconomic History  . Marital status: Divorced    Spouse name: Not on file  . Number of children: 4  . Years of education: Not on file  . Highest education level: 12th grade  Occupational History  . Occupation: Retired    Comment: Occupational hygienist  . Smoking status: Former Smoker    Packs/day: 1.00    Years: 61.00    Pack years: 61.00    Types: Cigarettes    Quit date: 03/25/2017    Years since quitting: 3.3  . Smokeless tobacco: Never Used  Vaping Use  . Vaping Use: Never used  Substance and Sexual Activity  . Alcohol use: No  . Drug use: No  . Sexual activity: Not Currently  Other Topics Concern  . Not on file  Social History Narrative   Full Code   4 children   Denies alcohol use   Patient has 2 daughters and 1 grandson living with her   Social Determinants of Health   Financial Resource Strain: Low Risk   . Difficulty of Paying Living Expenses: Not hard at all  Food Insecurity: No Food Insecurity  . Worried About Charity fundraiser in the Last Year: Never true  . Ran Out of Food in the Last Year: Never true  Transportation Needs: No Transportation Needs  . Lack of Transportation (Medical): No  . Lack of Transportation (Non-Medical): No  Physical Activity: Inactive  . Days of Exercise per Week: 0 days  . Minutes of Exercise per Session: 0 min   Stress: Stress Concern Present  . Feeling of Stress : Rather much  Social Connections: Moderately Isolated  . Frequency of Communication with Friends and Family: More than three times a week  . Frequency of Social Gatherings with Friends and Family: More than three times a week  . Attends Religious Services: More than 4 times per year  . Active Member of Clubs or Organizations: No  . Attends Archivist Meetings: Never  . Marital Status: Divorced  Human resources officer Violence: Not At Risk  . Fear of Current or Ex-Partner: No  . Emotionally Abused: No  . Physically Abused: No  . Sexually Abused: No    Family History  Problem Relation Age of Onset  . Other Mother        [place] posioning  . Heart attack Father   . Prostate cancer Father   . Heart attack Brother   . Diabetes Brother   . Hypertension Daughter   . Kidney cancer Neg Hx   . Bladder Cancer Neg Hx   . Colon cancer  Neg Hx   . Colon polyps Neg Hx   . Rectal cancer Neg Hx      Current Outpatient Medications:  .  acetaminophen (TYLENOL) 650 MG CR tablet, Take 1 tablet (650 mg total) by mouth 4 (four) times daily as needed for pain., Disp: , Rfl:  .  albuterol (VENTOLIN HFA) 108 (90 Base) MCG/ACT inhaler, Inhale 2 puffs into the lungs every 6 (six) hours as needed for wheezing or shortness of breath., Disp: 1 each, Rfl: 0 .  aspirin 81 MG EC tablet, Take 81 mg by mouth daily. Swallow whole., Disp: , Rfl:  .  atorvastatin (LIPITOR) 10 MG tablet, Take 1 tablet (10 mg total) by mouth at bedtime., Disp: 90 tablet, Rfl: 3 .  Cholecalciferol (VITAMIN D-3) 1000 units CAPS, Take 2,000 Units by mouth daily. Takes occassionally, Disp: , Rfl:  .  melatonin 3 MG TABS tablet, Take 3 mg by mouth at bedtime., Disp: , Rfl:  .  PARoxetine (PAXIL) 40 MG tablet, Take 1 tablet (40 mg total) by mouth every morning., Disp: 90 tablet, Rfl: 3 .  Probiotic Product (PROBIOTIC-10 PO), Take 1 capsule by mouth daily., Disp: , Rfl:   Physical  exam:  Vitals:   07/15/20 1401  BP: 107/81  Pulse: 82  Resp: 16  Temp: (!) 96.6 F (35.9 C)  TempSrc: Tympanic  Weight: 146 lb 1.6 oz (66.3 kg)   Physical Exam Constitutional:      General: She is not in acute distress. Eyes:     Extraocular Movements: EOM normal.     Pupils: Pupils are equal, round, and reactive to light.  Cardiovascular:     Rate and Rhythm: Normal rate and regular rhythm.     Heart sounds: Normal heart sounds.  Pulmonary:     Effort: Pulmonary effort is normal.     Breath sounds: Normal breath sounds.  Abdominal:     General: Bowel sounds are normal.     Palpations: Abdomen is soft.  Skin:    General: Skin is warm and dry.  Neurological:     Mental Status: She is alert and oriented to person, place, and time.      CMP Latest Ref Rng & Units 05/03/2020  Glucose 65 - 99 mg/dL 98  BUN 7 - 25 mg/dL 25  Creatinine 0.60 - 0.88 mg/dL 1.54(H)  Sodium 135 - 146 mmol/L 140  Potassium 3.5 - 5.3 mmol/L 5.0  Chloride 98 - 110 mmol/L 105  CO2 20 - 32 mmol/L 25  Calcium 8.6 - 10.4 mg/dL 10.1  Total Protein 6.1 - 8.1 g/dL 7.4  Total Bilirubin 0.2 - 1.2 mg/dL 0.5  Alkaline Phos 38 - 126 U/L -  AST 10 - 35 U/L 15  ALT 6 - 29 U/L 11   CBC Latest Ref Rng & Units 05/03/2020  WBC 3.8 - 10.8 Thousand/uL 6.6  Hemoglobin 11.7 - 15.5 g/dL 12.6  Hematocrit 35.0 - 45.0 % 37.9  Platelets 140 - 400 Thousand/uL 300    No images are attached to the encounter.  MR BRAIN W WO CONTRAST  Result Date: 07/13/2020 CLINICAL DATA:  Non-small cell lung cancer, staging. EXAM: MRI HEAD WITHOUT AND WITH CONTRAST TECHNIQUE: Multiplanar, multiecho pulse sequences of the brain and surrounding structures were obtained without and with intravenous contrast. CONTRAST:  73m GADAVIST GADOBUTROL 1 MMOL/ML IV SOLN COMPARISON:  Head CT July 03, 2014. FINDINGS: Brain: No acute infarction, hemorrhage, hydrocephalus, extra-axial collection or mass lesion. Remote infarcts in the bilateral  cerebellar hemispheres. Scattered and confluent foci of T2 hyperintensity are seen within the white matter of the cerebral hemispheres and within the pons, nonspecific, most likely related to chronic microangiopathic changes. No focus of abnormal contrast enhancement. Vascular: Normal flow voids. Skull and upper cervical spine: Normal marrow signal. Sinuses/Orbits: Mucosal thickening of the ethmoid cells. The orbits are maintained. IMPRESSION: 1. No evidence of intracranial metastatic disease. 2. 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 3. Advanced chronic microangiopathic changes. Electronically Signed   By: Pedro Earls M.D.   On: 07/13/2020 14:57   NM PET Image Initial (PI) Skull Base To Thigh  Result Date: 07/14/2020 CLINICAL DATA:  Initial treatment strategy for non-small cell lung cancer staging in this 80 year old female. EXAM: NUCLEAR MEDICINE PET SKULL BASE TO THIGH TECHNIQUE: 8.24 mCi F-18 FDG was injected intravenously. Full-ring PET imaging was performed from the skull base to thigh after the radiotracer. CT data was obtained and used for attenuation correction and anatomic localization. Fasting blood glucose: 89 mg/dl COMPARISON:  CT chest December 21st 2021 FINDINGS: Mediastinal blood pool activity: SUV max 2.60 Liver activity: SUV max NA NECK: No hypermetabolic lymph nodes in the neck. Incidental CT findings: Calcified atheromatous plaque in the carotid arteries bilaterally. Retropharyngeal course of the carotid arteries in the low neck. CHEST: Mass in the RIGHT lower lobe 4.2 x 3.2 cm stable compared to prior imaging from December. Maximum SUV of 97.6 no hypermetabolic lymph nodes in the chest Incidental CT findings: Pulmonary emphysema. Tiny nodule along the minor fissure in the RIGHT chest (image 100, series 3) 3 mm. Mass in the RIGHT lower lobe extends to the pleural surface, not associated with a pleural effusion. Airways are patent. Calcified atheromatous  plaque in the thoracic aorta. No aneurysmal dilation. Three-vessel coronary artery disease as on the previous chest CT. ABDOMEN/PELVIS: No abnormal hypermetabolic activity within the liver, pancreas, adrenal glands, or spleen. No hypermetabolic lymph nodes in the abdomen or pelvis. Incidental CT findings: Renal cortical scarring. No hydronephrosis. Calcified atheromatous plaque of the abdominal aorta. 3 cm maximal caliber. Sigmoid diverticulosis. No acute abdominal process. Normal appendix. SKELETON: No focal hypermetabolic activity to suggest skeletal metastasis. Incidental CT findings: Proximal femoral ORIF with intramedullary device and trans femoral screw as before. IMPRESSION: Primary pulmonary neoplasm in the RIGHT lower lobe without interval change and with marked FDG uptake. No signs of metastatic disease. Tiny nodule along the minor fissure in the RIGHT chest, attention on follow-up. Pulmonary emphysema. Aortic atherosclerosis. Mild aneurysmal caliber of abdominal aorta as described. Recommend follow-up every 3 years. This recommendation follows ACR consensus guidelines: White Paper of the ACR Incidental Findings Committee II on Vascular Findings. J Am Coll Radiol 2013; 10:789-794. Aortic Atherosclerosis (ICD10-I70.0) and Emphysema (ICD10-J43.9). Aortic aneurysm NOS (ICD10-I71.9). Electronically Signed   By: Zetta Bills M.D.   On: 07/14/2020 10:43   DG Chest Port 1 View  Result Date: 06/28/2020 CLINICAL DATA:  Known right lower lobe mass, status post biopsy EXAM: PORTABLE CHEST 1 VIEW COMPARISON:  05/03/2020 FINDINGS: Cardiac shadow is within normal limits. Aortic calcifications are noted. Persistent right lower lobe mass lesion is again seen. No pneumothorax is noted following biopsy. No new focal abnormality is seen. IMPRESSION: No pneumothorax following right lower lobe lung biopsy. Electronically Signed   By: Inez Catalina M.D.   On: 06/28/2020 13:52   DG C-Arm 1-60 Min-No Report  Result Date:  06/28/2020 Fluoroscopy was utilized by the requesting physician.  No radiographic interpretation.  Assessment and plan- Patient is a 80 y.o. female with newly diagnosed stage IIa non-small cell lung cancer of the right lower lobe cT2 acN0 cM0 here to discuss further management  I have reviewed PET/CT scan images independently.  We have also discussed patient's case at tumor board.  Patient had biopsy of the right lower lobe lung mass which showed non-small cell lung cancer but further characterization between adenocarcinoma and squamous cell carcinoma could not be obtained.  PET CT scan did not show any evidence of hilar or mediastinal adenopathy.  Lymph nodes appeared normal at the time of bronchoscopy and therefore lymph node sampling has not been done.  As per NCCN guidelines for stage IIa non-small cell lung cancer standard of care would be radiation therapy alone and patient has met with Dr. Donella Stade from radiation oncology who will be offering her 5 fractions of SBRT.  I will plan to see her tentatively in 6 to 8 weeks with a repeat CT scan.  No role for systemic chemotherapy at this time.  Patient verbalized understanding.  Treatment will be given with a curative intent  Cancer Staging Malignant neoplasm of lower lobe of right lung ([place]) Staging form: Lung, AJCC 8th Edition - Clinical: Stage IIA (cT2b, cN0, cM0) - Signed by Sindy Guadeloupe, MD on 07/17/2020   Visit Diagnosis 1. Non-small cell cancer of right lung ([place])   2. Malignant neoplasm of lower lobe of right lung ([place])      Dr. Randa Evens, MD, MPH [facility] [phone] 07/17/2020 7:45 PM

## 2020-07-20 ENCOUNTER — Ambulatory Visit
Admission: RE | Admit: 2020-07-20 | Discharge: 2020-07-20 | Disposition: A | Discharge status: Home / Self Care | Payer: Medicare Other | Source: Ambulatory Visit | Attending: Radiation Oncology | Admitting: Radiation Oncology

## 2020-07-20 ENCOUNTER — Encounter: Payer: Self-pay | Admitting: *Deleted

## 2020-07-20 DIAGNOSIS — C3431 Malignant neoplasm of lower lobe, right bronchus or lung: Secondary | ICD-10-CM | POA: Insufficient documentation

## 2020-07-20 DIAGNOSIS — Z510 Encounter for antineoplastic radiation therapy: Secondary | ICD-10-CM | POA: Insufficient documentation

## 2020-07-20 DIAGNOSIS — Z87891 Personal history of nicotine dependence: Secondary | ICD-10-CM | POA: Diagnosis not present

## 2020-07-20 DIAGNOSIS — C3432 Malignant neoplasm of lower lobe, left bronchus or lung: Secondary | ICD-10-CM | POA: Diagnosis not present

## 2020-07-21 NOTE — Progress Notes (Signed)
  Oncology Nurse Navigator Documentation  Navigator Location: [facility] (07/21/20 0900)   )Navigator Encounter Type: Lobby (07/21/20 0900)                     Patient Visit Type: RadOnc (07/21/20 0900) Treatment Phase: CT SIM (07/21/20 0900) Barriers/Navigation Needs: No Barriers At This Time;No Needs (07/21/20 0900)   Interventions: None Required (07/21/20 0900)         met pt and her daughter after CT simulation. Pt given copy of upcoming appts for follow up CT scan and follow up visit with Dr. Janese Banks. Instructed to call with any questions or needs. Pt verbalized understanding.             Time Spent with Patient: 30 (07/21/20 0900)

## 2020-07-22 DIAGNOSIS — C3432 Malignant neoplasm of lower lobe, left bronchus or lung: Secondary | ICD-10-CM | POA: Diagnosis not present

## 2020-07-22 DIAGNOSIS — Z510 Encounter for antineoplastic radiation therapy: Secondary | ICD-10-CM | POA: Diagnosis not present

## 2020-07-22 DIAGNOSIS — Z87891 Personal history of nicotine dependence: Secondary | ICD-10-CM | POA: Diagnosis not present

## 2020-07-23 ENCOUNTER — Other Ambulatory Visit: Payer: Self-pay | Admitting: *Deleted

## 2020-07-23 DIAGNOSIS — C3431 Malignant neoplasm of lower lobe, right bronchus or lung: Secondary | ICD-10-CM

## 2020-07-27 ENCOUNTER — Ambulatory Visit
Admission: RE | Admit: 2020-07-27 | Discharge: 2020-07-27 | Disposition: A | Discharge status: Home / Self Care | Payer: Medicare Other | Source: Ambulatory Visit | Attending: Radiation Oncology | Admitting: Radiation Oncology

## 2020-07-27 ENCOUNTER — Encounter: Payer: Self-pay | Admitting: *Deleted

## 2020-07-27 ENCOUNTER — Ambulatory Visit: Payer: Medicare Other

## 2020-07-27 DIAGNOSIS — C3432 Malignant neoplasm of lower lobe, left bronchus or lung: Secondary | ICD-10-CM | POA: Diagnosis not present

## 2020-07-27 DIAGNOSIS — Z510 Encounter for antineoplastic radiation therapy: Secondary | ICD-10-CM | POA: Diagnosis not present

## 2020-07-28 ENCOUNTER — Ambulatory Visit
Admission: RE | Admit: 2020-07-28 | Discharge: 2020-07-28 | Disposition: A | Discharge status: Home / Self Care | Payer: Medicare Other | Source: Ambulatory Visit | Attending: Radiation Oncology | Admitting: Radiation Oncology

## 2020-07-28 DIAGNOSIS — C3432 Malignant neoplasm of lower lobe, left bronchus or lung: Secondary | ICD-10-CM | POA: Diagnosis not present

## 2020-07-28 DIAGNOSIS — Z510 Encounter for antineoplastic radiation therapy: Secondary | ICD-10-CM | POA: Diagnosis not present

## 2020-07-28 NOTE — Progress Notes (Signed)
  Oncology Nurse Navigator Documentation  Navigator Location: [facility] (07/27/20 1021)   )Navigator Encounter Type: Appt/Treatment Plan Review (07/27/20 1021)                   Treatment Initiated Date: 07/27/20 (07/27/20 1021) Patient Visit Type: UORVIF (07/27/20 1021) Treatment Phase: First Radiation Tx (07/27/20 1021) Barriers/Navigation Needs: No Barriers At This Time (07/27/20 1021)   Interventions: None Required (07/27/20 1021)

## 2020-07-29 ENCOUNTER — Ambulatory Visit
Admission: RE | Admit: 2020-07-29 | Discharge: 2020-07-29 | Disposition: A | Discharge status: Home / Self Care | Payer: Medicare Other | Source: Ambulatory Visit | Attending: Radiation Oncology | Admitting: Radiation Oncology

## 2020-07-29 ENCOUNTER — Ambulatory Visit: Payer: Medicare Other

## 2020-07-29 DIAGNOSIS — Z510 Encounter for antineoplastic radiation therapy: Secondary | ICD-10-CM | POA: Diagnosis not present

## 2020-07-29 DIAGNOSIS — C3432 Malignant neoplasm of lower lobe, left bronchus or lung: Secondary | ICD-10-CM | POA: Diagnosis not present

## 2020-07-30 ENCOUNTER — Encounter: Payer: Self-pay | Admitting: Adult Health

## 2020-07-30 ENCOUNTER — Ambulatory Visit
Admission: RE | Admit: 2020-07-30 | Discharge: 2020-07-30 | Disposition: A | Discharge status: Home / Self Care | Payer: Medicare Other | Source: Ambulatory Visit | Attending: Radiation Oncology | Admitting: Radiation Oncology

## 2020-07-30 ENCOUNTER — Ambulatory Visit (INDEPENDENT_AMBULATORY_CARE_PROVIDER_SITE_OTHER): Payer: Medicare Other | Admitting: Adult Health

## 2020-07-30 ENCOUNTER — Other Ambulatory Visit: Payer: Self-pay

## 2020-07-30 DIAGNOSIS — C3431 Malignant neoplasm of lower lobe, right bronchus or lung: Secondary | ICD-10-CM

## 2020-07-30 DIAGNOSIS — C3432 Malignant neoplasm of lower lobe, left bronchus or lung: Secondary | ICD-10-CM | POA: Diagnosis not present

## 2020-07-30 DIAGNOSIS — Z510 Encounter for antineoplastic radiation therapy: Secondary | ICD-10-CM | POA: Diagnosis not present

## 2020-07-30 NOTE — Assessment & Plan Note (Addendum)
Stage IIa non small cell lung cancer - undergoing SBRT  Planned serial CT in April   Plan  Patient Instructions  Continue on current regimen  Follow up with CT chest as planned in April  Continue with Radiation as planned  Follow up with Dr. Patsey Berthold in 3 months and As needed

## 2020-07-30 NOTE — Patient Instructions (Signed)
Continue on current regimen  Follow up with CT chest as planned in April  Continue with Radiation as planned  Follow up with Dr. Patsey Berthold in 3 months and As needed

## 2020-07-30 NOTE — Progress Notes (Signed)
@Patient  ID: Christine Burnett, female    DOB: [birth year], 80 y.o.   MRN: [id number]  Chief Complaint  Patient presents with  . Follow-up    Referring provider: Laurell Roof  HPI: 80 year old female former smoker seen for pulmonary consult January 2022 for lung mass  TEST/EVENTS :  CT chest May 21, 2020 showed a 4.0 cm spiculated mass lesion in the right lower lobe.  07/30/2020 Follow up : Lung cancer Patient returns for a follow-up visit.  Patient was seen last month for a pulmonary consult.  Patient was admitted in October 2021 with Covid.  During hospital stay work-up revealed a masslike opacity in the right infrahilar lung.  She was treated with antibiotics for suspected pneumonia.  Follow-up CT chest showed persistent 4.0 cm spiculated mass in the right lower lobe.  Subsequent PET scan showed a right lower lobe lung mass with marked FDG uptake/hypermetabolic activity.  No signs of metastatic disease.  2 tiny nodules along the minor fissure in the right chest.  And pulmonary emphysema.MRI brain showed no evidence of intracranial metastatic disease.  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  Patient underwent navigational bronchoscopy on June 28, 2020.  Cytology was consistent with non-small cell carcinoma.  Lymph nodes appeared normal at the time of bronchoscopy and were not sampled.  Patient was referred to oncology.  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  She was recommended for SBRT.  Was not recommended for systemic chemo. She has completed 4 sessions , says tolerating well with no increased cough or dyspnea.   No significant cough or congestion . Gets winded with som activity . No albuterol use.  Denies hemoptysis , chest pain or weight loss. Good appetite .   Has planned CT chest 09/16/20 .      Allergies  Allergen Reactions  . Cefuroxime Axetil     caused rash.  . Ciprofloxacin Nausea Only    Unknown  . Codeine Other (See  Comments)    Patient does not like the way this makes her feel    Immunization History  Administered Date(s) Administered  . Fluad Quad(high Dose 65+) 06/27/2019  . Influenza Split 03/15/2010, 03/21/2011  . Influenza, High Dose Seasonal PF 04/07/2015, 03/22/2016, 03/13/2017, 02/25/2018  . Influenza,inj,Quad PF,6+ Mos 03/19/2013, 03/10/2014  . Pneumococcal Conjugate-13 11/08/2017  . Pneumococcal Polysaccharide-23 01/21/2019  . Tdap 05/29/2017    Past Medical History:  Diagnosis Date  . Allergic rhinitis   . Anxiety   . Chronic bronchitis ([place])   . Chronic kidney disease, stage III (moderate) ([place]) 08/31/2017   Seeing nephrologist  . CKD (chronic kidney disease), stage III ([place])   . COPD (chronic obstructive pulmonary disease) ([place])   . Cornea scar   . Depression    unspecified  . Diverticulosis   . Hx of fracture of left hip 12/24/2017  . Hyperlipidemia   . 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 Non-small cell lung cancer (NSCLC) ([place])   . Osteoporosis   . Risk for falls   . Situational disturbance   . Stroke ([place]) 2011   tia's x 2  . TIA (transient ischemic attack)     Tobacco History: Social History   Tobacco Use  Smoking Status Former Smoker  . Packs/day: 1.00  . Years: 61.00  . Pack years: 61.00  . Types: Cigarettes  . Quit date: 03/25/2017  . Years since quitting: 3.3  Smokeless Tobacco Never Used  Counseling given: Not Answered   Outpatient Medications Prior to Visit  Medication Sig Dispense Refill  . acetaminophen (TYLENOL) 650 MG CR tablet Take 1 tablet (650 mg total) by mouth 4 (four) times daily as needed for pain.    Marland Kitchen albuterol (VENTOLIN HFA) 108 (90 Base) MCG/ACT inhaler Inhale 2 puffs into the lungs every 6 (six) hours as needed for wheezing or shortness of breath. 1 each 0  . aspirin 81 MG EC tablet Take 81 mg by mouth daily. Swallow whole.    Marland Kitchen atorvastatin (LIPITOR) 10 MG tablet Take 1 tablet (10  mg total) by mouth at bedtime. 90 tablet 3  . Cholecalciferol (VITAMIN D-3) 1000 units CAPS Take 2,000 Units by mouth daily. Takes occassionally    . melatonin 3 MG TABS tablet Take 3 mg by mouth at bedtime.    Marland Kitchen PARoxetine (PAXIL) 40 MG tablet Take 1 tablet (40 mg total) by mouth every morning. 90 tablet 3  . Probiotic Product (PROBIOTIC-10 PO) Take 1 capsule by mouth daily.     No facility-administered medications prior to visit.     Review of Systems:   Constitutional:   No  weight loss, night sweats,  Fevers, chills, fatigue, or  lassitude.  HEENT:   No headaches,  Difficulty swallowing,  Tooth/dental problems, or  Sore throat,                No sneezing, itching, ear ache, nasal congestion, post nasal drip,   CV:  No chest pain,  Orthopnea, PND, swelling in lower extremities, anasarca, dizziness, palpitations, syncope.   GI  No heartburn, indigestion, abdominal pain, nausea, vomiting, diarrhea, change in bowel habits, loss of appetite, bloody stools.   Resp:  No excess mucus, no productive cough,  No non-productive cough,  No coughing up of blood.  No change in color of mucus.  No wheezing.  No chest wall deformity  Skin: no rash or lesions.  GU: no dysuria, change in color of urine, no urgency or frequency.  No flank pain, no hematuria   MS:  No joint pain or swelling.  No decreased range of motion.  No back pain.    Physical Exam  BP 140/78 (BP Location: Left Arm, Cuff Size: Normal)   Pulse 94   Temp (!) 97.3 F (36.3 C) (Temporal)   Ht 5\' 7"  (1.702 m)   Wt 146 lb 3.2 oz (66.3 kg)   SpO2 98%   BMI 22.90 kg/m   GEN: A/Ox3; pleasant , NAD, elderly    HEENT:  [place]/AT,    NOSE-clear, THROAT-clear, no lesions, no postnasal drip or exudate noted.   NECK:  Supple w/ fair ROM; no JVD; normal carotid impulses w/o bruits; no thyromegaly or nodules palpated; no lymphadenopathy.    RESP  Clear  P & A; w/o, wheezes/ rales/ or rhonchi. no accessory muscle use, no dullness to  percussion  CARD:  RRR, no m/r/g, no peripheral edema, pulses intact, no cyanosis or clubbing.  GI:   Soft & nt; nml bowel sounds; no organomegaly or masses detected.   Musco: Warm bil, no deformities or joint swelling noted.   Neuro: alert, no focal deficits noted.    Skin: Warm, no lesions or rashes    Lab Results:    ProBNP No results found for: PROBNP  Imaging: MR BRAIN W WO CONTRAST  Result Date: 07/13/2020 CLINICAL DATA:  Non-small cell lung cancer, staging. EXAM: MRI HEAD WITHOUT AND WITH CONTRAST TECHNIQUE: Multiplanar, multiecho pulse sequences  of the brain and surrounding structures were obtained without and with intravenous contrast. CONTRAST:  48mL GADAVIST GADOBUTROL 1 MMOL/ML IV SOLN COMPARISON:  Head CT July 03, 2014. FINDINGS: Brain: No acute infarction, hemorrhage, hydrocephalus, extra-axial collection or mass lesion. 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 Scattered and confluent foci of T2 hyperintensity are seen within the white matter of the cerebral hemispheres and within the pons, nonspecific, most likely related to chronic microangiopathic changes. No focus of abnormal contrast enhancement. Vascular: Normal flow voids. Skull and upper cervical spine: Normal marrow signal. Sinuses/Orbits: Mucosal thickening of the ethmoid cells. The orbits are maintained. IMPRESSION: 1. No evidence of intracranial metastatic disease. 2. 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 3. Advanced chronic microangiopathic changes. Electronically Signed   By: Pedro Earls M.D.   On: 07/13/2020 14:57   NM PET Image Initial (PI) Skull Base To Thigh  Result Date: 07/14/2020 CLINICAL DATA:  Initial treatment strategy for non-small cell lung cancer staging in this 80 year old female. EXAM: NUCLEAR MEDICINE PET SKULL BASE TO THIGH TECHNIQUE: 8.24 mCi F-18 FDG was injected intravenously. Full-ring PET imaging was performed from the skull base to  thigh after the radiotracer. CT data was obtained and used for attenuation correction and anatomic localization. Fasting blood glucose: 89 mg/dl COMPARISON:  CT chest December 21st 2021 FINDINGS: Mediastinal blood pool activity: SUV max 2.60 Liver activity: SUV max NA NECK: No hypermetabolic lymph nodes in the neck. Incidental CT findings: Calcified atheromatous plaque in the carotid arteries bilaterally. Retropharyngeal course of the carotid arteries in the low neck. CHEST: Mass in the RIGHT lower lobe 4.2 x 3.2 cm stable compared to prior imaging from December. Maximum SUV of 95.2 no hypermetabolic lymph nodes in the chest Incidental CT findings: Pulmonary emphysema. Tiny nodule along the minor fissure in the RIGHT chest (image 100, series 3) 3 mm. Mass in the RIGHT lower lobe extends to the pleural surface, not associated with a pleural effusion. Airways are patent. Calcified atheromatous plaque in the thoracic aorta. No aneurysmal dilation. Three-vessel coronary artery disease as on the previous chest CT. ABDOMEN/PELVIS: No abnormal hypermetabolic activity within the liver, pancreas, adrenal glands, or spleen. No hypermetabolic lymph nodes in the abdomen or pelvis. Incidental CT findings: Renal cortical scarring. No hydronephrosis. Calcified atheromatous plaque of the abdominal aorta. 3 cm maximal caliber. Sigmoid diverticulosis. No acute abdominal process. Normal appendix. SKELETON: No focal hypermetabolic activity to suggest skeletal metastasis. Incidental CT findings: Proximal femoral ORIF with intramedullary device and trans femoral screw as before. IMPRESSION: Primary pulmonary neoplasm in the RIGHT lower lobe without interval change and with marked FDG uptake. No signs of metastatic disease. Tiny nodule along the minor fissure in the RIGHT chest, attention on follow-up. Pulmonary emphysema. Aortic atherosclerosis. Mild aneurysmal caliber of abdominal aorta as described. Recommend follow-up every 3 years.  This recommendation follows ACR consensus guidelines: White Paper of the ACR Incidental Findings Committee II on Vascular Findings. J Am Coll Radiol 2013; 10:789-794. Aortic Atherosclerosis (ICD10-I70.0) and Emphysema (ICD10-J43.9). Aortic aneurysm NOS (ICD10-I71.9). Electronically Signed   By: Zetta Bills M.D.   On: 07/14/2020 10:43      No flowsheet data found.  No results found for: NITRICOXIDE      Assessment & Plan:   Malignant neoplasm of lower lobe of right lung (HCC) Stage IIa non small cell lung cancer - undergoing SBRT  Planned serial CT in April   Plan  Patient Instructions  Continue on current regimen  Follow up with  CT chest as planned in April  Continue with Radiation as planned  Follow up with Dr. Patsey Berthold in 3 months and As needed           Rexene Edison, NP 07/30/2020

## 2020-08-02 ENCOUNTER — Inpatient Hospital Stay: Payer: Medicare Other

## 2020-08-02 ENCOUNTER — Ambulatory Visit
Admission: RE | Admit: 2020-08-02 | Discharge: 2020-08-02 | Disposition: A | Discharge status: Home / Self Care | Payer: Medicare Other | Source: Ambulatory Visit | Attending: Radiation Oncology | Admitting: Radiation Oncology

## 2020-08-02 DIAGNOSIS — C3431 Malignant neoplasm of lower lobe, right bronchus or lung: Secondary | ICD-10-CM

## 2020-08-02 DIAGNOSIS — C3432 Malignant neoplasm of lower lobe, left bronchus or lung: Secondary | ICD-10-CM | POA: Diagnosis not present

## 2020-08-02 DIAGNOSIS — Z510 Encounter for antineoplastic radiation therapy: Secondary | ICD-10-CM | POA: Diagnosis not present

## 2020-08-02 LAB — CBC
HCT: 37.9 % (ref 36.0–46.0)
Hemoglobin: 12.5 g/dL (ref 12.0–15.0)
MCH: 32.5 pg (ref 26.0–34.0)
MCHC: 33 g/dL (ref 30.0–36.0)
MCV: 98.4 fL (ref 80.0–100.0)
Platelets: 204 10*3/uL (ref 150–400)
RBC: 3.85 MIL/uL — ABNORMAL LOW (ref 3.87–5.11)
RDW: 12.7 % (ref 11.5–15.5)
WBC: 6.3 10*3/uL (ref 4.0–10.5)
nRBC: 0 % (ref 0.0–0.2)

## 2020-08-02 NOTE — Progress Notes (Signed)
Agree with the details of the procedure as noted by Tammy Parrett, NP.  C. Laura Toinette Lackie, MD [facility] 

## 2020-08-03 ENCOUNTER — Ambulatory Visit: Payer: Medicare Other

## 2020-08-03 ENCOUNTER — Ambulatory Visit
Admission: RE | Admit: 2020-08-03 | Discharge: 2020-08-03 | Disposition: A | Discharge status: Home / Self Care | Payer: Medicare Other | Source: Ambulatory Visit | Attending: Radiation Oncology | Admitting: Radiation Oncology

## 2020-08-03 DIAGNOSIS — C3432 Malignant neoplasm of lower lobe, left bronchus or lung: Secondary | ICD-10-CM | POA: Diagnosis not present

## 2020-08-03 DIAGNOSIS — C3431 Malignant neoplasm of lower lobe, right bronchus or lung: Secondary | ICD-10-CM | POA: Diagnosis not present

## 2020-08-03 DIAGNOSIS — Z510 Encounter for antineoplastic radiation therapy: Secondary | ICD-10-CM | POA: Insufficient documentation

## 2020-08-04 ENCOUNTER — Ambulatory Visit
Admission: RE | Admit: 2020-08-04 | Discharge: 2020-08-04 | Disposition: A | Discharge status: Home / Self Care | Payer: Medicare Other | Source: Ambulatory Visit | Attending: Radiation Oncology | Admitting: Radiation Oncology

## 2020-08-04 DIAGNOSIS — Z510 Encounter for antineoplastic radiation therapy: Secondary | ICD-10-CM | POA: Diagnosis not present

## 2020-08-04 DIAGNOSIS — C3432 Malignant neoplasm of lower lobe, left bronchus or lung: Secondary | ICD-10-CM | POA: Diagnosis not present

## 2020-08-05 ENCOUNTER — Ambulatory Visit: Payer: Medicare Other

## 2020-08-05 ENCOUNTER — Ambulatory Visit
Admission: RE | Admit: 2020-08-05 | Discharge: 2020-08-05 | Disposition: A | Discharge status: Home / Self Care | Payer: Medicare Other | Source: Ambulatory Visit | Attending: Radiation Oncology | Admitting: Radiation Oncology

## 2020-08-05 DIAGNOSIS — Z510 Encounter for antineoplastic radiation therapy: Secondary | ICD-10-CM | POA: Diagnosis not present

## 2020-08-05 DIAGNOSIS — C3431 Malignant neoplasm of lower lobe, right bronchus or lung: Secondary | ICD-10-CM | POA: Diagnosis not present

## 2020-08-05 DIAGNOSIS — Z87891 Personal history of nicotine dependence: Secondary | ICD-10-CM | POA: Diagnosis not present

## 2020-08-06 ENCOUNTER — Ambulatory Visit
Admission: RE | Admit: 2020-08-06 | Discharge: 2020-08-06 | Disposition: A | Discharge status: Home / Self Care | Payer: Medicare Other | Source: Ambulatory Visit | Attending: Radiation Oncology | Admitting: Radiation Oncology

## 2020-08-06 DIAGNOSIS — Z510 Encounter for antineoplastic radiation therapy: Secondary | ICD-10-CM | POA: Diagnosis not present

## 2020-08-06 DIAGNOSIS — C3431 Malignant neoplasm of lower lobe, right bronchus or lung: Secondary | ICD-10-CM | POA: Diagnosis not present

## 2020-08-06 DIAGNOSIS — Z87891 Personal history of nicotine dependence: Secondary | ICD-10-CM | POA: Diagnosis not present

## 2020-08-09 ENCOUNTER — Ambulatory Visit
Admission: RE | Admit: 2020-08-09 | Discharge: 2020-08-09 | Disposition: A | Discharge status: Home / Self Care | Payer: Medicare Other | Source: Ambulatory Visit | Attending: Radiation Oncology | Admitting: Radiation Oncology

## 2020-08-09 DIAGNOSIS — Z87891 Personal history of nicotine dependence: Secondary | ICD-10-CM | POA: Diagnosis not present

## 2020-08-09 DIAGNOSIS — Z510 Encounter for antineoplastic radiation therapy: Secondary | ICD-10-CM | POA: Diagnosis not present

## 2020-08-09 DIAGNOSIS — C3431 Malignant neoplasm of lower lobe, right bronchus or lung: Secondary | ICD-10-CM | POA: Diagnosis not present

## 2020-08-10 ENCOUNTER — Ambulatory Visit: Payer: Medicare Other

## 2020-08-16 ENCOUNTER — Telehealth: Payer: Self-pay | Admitting: *Deleted

## 2020-08-16 MED ORDER — PANTOPRAZOLE SODIUM 20 MG PO TBEC: 20.0000 mg | DELAYED_RELEASE_TABLET | Freq: Every day | ORAL | 0 refills | Status: DC

## 2020-08-16 NOTE — Telephone Encounter (Signed)
Patient called reporting that she is having difficulty swallowing causing extreme pain since last Monday. Her last radiation therapy treatment was on 3/7. She has tried Maalox which seems to help some. Please advise.

## 2020-08-16 NOTE — Telephone Encounter (Signed)
Patient notified that 2 prescription have been sent for her

## 2020-08-16 NOTE — Telephone Encounter (Signed)
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 Send prescription for pantoprazole 20 mg daily and MMW. If symptoms no better in 2-3 days, call us back

## 2020-08-18 NOTE — Telephone Encounter (Signed)
I faxed the MMW to her pharmacy

## 2020-09-04 ENCOUNTER — Encounter: Payer: Self-pay | Admitting: Emergency Medicine

## 2020-09-04 ENCOUNTER — Ambulatory Visit
Admission: EM | Admit: 2020-09-04 | Discharge: 2020-09-04 | Disposition: A | Discharge status: Home / Self Care | Payer: Medicare Other

## 2020-09-04 ENCOUNTER — Other Ambulatory Visit: Payer: Self-pay

## 2020-09-04 DIAGNOSIS — M25551 Pain in right hip: Secondary | ICD-10-CM | POA: Diagnosis not present

## 2020-09-04 DIAGNOSIS — W19XXXA Unspecified fall, initial encounter: Secondary | ICD-10-CM | POA: Diagnosis not present

## 2020-09-04 DIAGNOSIS — S76211A Strain of adductor muscle, fascia and tendon of right thigh, initial encounter: Secondary | ICD-10-CM | POA: Diagnosis not present

## 2020-09-04 MED ORDER — TRAMADOL HCL 50 MG PO TABS
50.0000 mg | ORAL_TABLET | Freq: Four times a day (QID) | ORAL | 0 refills | Status: AC | PRN
Start: 2020-09-04 — End: 2020-09-09

## 2020-09-04 NOTE — ED Provider Notes (Signed)
[facility]    CSN: [id number] Arrival date & time: 09/04/20  1410      History   Chief Complaint Chief Complaint  Patient presents with  . Fall    2 weeks ago  . Groin Pain    right    HPI Christine Burnett is a 80 y.o. female presenting for approximately 2-week history of right hip and groin pain.  Patient says that she slipped and fell 2 weeks ago and has had pain in this area since.  Pain is constant and aching.  She says she is using a walker and she normally does not.  She says the pain is worse with movement or if she coughs.  She has been taking Tylenol for pain relief.  She says this medication has not really helped that much.  Patient says her symptoms have not gotten better or worse since the fall.  She says her back does hurt a little bit but not as bad as the groin region.  No pain radiating into the legs.  Denies any abdominal or pelvic pain.  No fever, dysuria, nausea/vomiting or diarrhea.  Patient has lung cancer and states that she just completed radiation therapy for lung cancer and does not really want imaging studies performed today.  She says she just wants to know what is causing the pain.  She has no other, complaints or concerns.  Other past medical history significant for COPD, hyperlipidemia, TIAs, stage III CKD, anxiety, depression, previous left hip fracture.  Additionally she has osteoporosis and is a fall risk.  HPI  Past Medical History:  Diagnosis Date  . Allergic rhinitis   . Anxiety   . Chronic bronchitis ([place])   . Chronic kidney disease, stage III (moderate) ([place]) 08/31/2017   Seeing nephrologist  . CKD (chronic kidney disease), stage III ([place])   . COPD (chronic obstructive pulmonary disease) ([place])   . Cornea scar   . Depression    unspecified  . Diverticulosis   . Hx of fracture of left hip 12/24/2017  . Hyperlipidemia   . 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 Non-small cell lung  cancer (NSCLC) ([place])   . Osteoporosis   . Risk for falls   . Situational disturbance   . Stroke ([place]) 2011   tia's x 2  . TIA (transient ischemic attack)     Patient Active Problem List   Diagnosis Date Noted  . Goals of care, counseling/discussion 07/17/2020  . Malignant neoplasm of lower lobe of right lung ([place]) 07/04/2020  . Pneumonia due to COVID-19 virus 03/17/2020  . Hypoxia 03/17/2020  . Prediabetes 08/13/2018  . Major depressive disorder, recurrent ([place]) 06/13/2018  . Allergic rhinitis 09/27/2017  . 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 Stage 3b chronic kidney disease ([place]) 08/31/2017  . 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 Osteoporosis without current pathological fracture 03/30/2017  . Cardiac murmur 04/07/2015  . 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 Anxiety 01/05/2015  . Hyperlipidemia 01/05/2015  . HTN (hypertension) 09/24/2014    Past Surgical History:  Procedure Laterality Date  . COLONOSCOPY  2016  . ESOPHAGUS SURGERY    . INTRAMEDULLARY (IM) NAIL INTERTROCHANTERIC Left 03/26/2017   Procedure: INTRAMEDULLARY (IM) NAIL INTERTROCHANTRIC;  Surgeon: Hessie Knows, MD;  Location: [facility] ORS;  Service: Orthopedics;  Laterality: Left;  Marland Kitchen VIDEO BRONCHOSCOPY WITH ENDOBRONCHIAL NAVIGATION Right 06/28/2020  Procedure: VIDEO BRONCHOSCOPY WITH ENDOBRONCHIAL NAVIGATION CELLVIZIO;  Surgeon: Tyler Pita, MD;  Location: [facility] ORS;  Service: Pulmonary;  Laterality: Right;    OB History   No obstetric history on file.      Home Medications    Prior to Admission medications   Medication Sig Start Date End Date Taking? Authorizing Provider  acetaminophen (TYLENOL) 650 MG CR tablet Take 1 tablet (650 mg total) by mouth 4 (four) times daily as needed for pain. 01/30/20  Yes Delsa Grana, PA-C  aspirin 81 MG EC tablet Take 81 mg by mouth daily.  Swallow whole.   Yes [provider]  atorvastatin (LIPITOR) 10 MG tablet Take 1 tablet (10 mg total) by mouth at bedtime. 01/30/20  Yes Delsa Grana, PA-C  Cholecalciferol (VITAMIN D-3) 1000 units CAPS Take 2,000 Units by mouth daily. Takes occassionally 08/09/17  Yes Lada, Satira Anis, MD  hydrOXYzine (ATARAX/VISTARIL) 10 MG tablet Take 10 mg by mouth every 8 (eight) hours as needed. 08/12/20  Yes [provider]  melatonin 3 MG TABS tablet Take 3 mg by mouth at bedtime.   Yes [provider]  PARoxetine (PAXIL) 40 MG tablet Take 1 tablet (40 mg total) by mouth every morning. 01/29/20  Yes Delsa Grana, PA-C  Probiotic Product (PROBIOTIC-10 PO) Take 1 capsule by mouth daily.   Yes [provider]  traMADol (ULTRAM) 50 MG tablet Take 1 tablet (50 mg total) by mouth every 6 (six) hours as needed for up to 5 days. 09/04/20 09/09/20 Yes Danton Clap, PA-C  albuterol (VENTOLIN HFA) 108 (90 Base) MCG/ACT inhaler Inhale 2 puffs into the lungs every 6 (six) hours as needed for wheezing or shortness of breath. 03/16/20   Delsa Grana, PA-C  magic mouthwash (lidocaine, diphenhydrAMINE, alum & mag hydroxide) suspension Swish and swallow 5 mLs 4 (four) times daily as needed for mouth pain.    [provider]  pantoprazole (PROTONIX) 20 MG tablet Take 1 tablet (20 mg total) by mouth daily. 08/16/20   Sindy Guadeloupe, MD    Family History Family History  Problem Relation Age of Onset  . Other Mother        [place] posioning  . Heart attack Father   . Prostate cancer Father   . Heart attack Brother   . Diabetes Brother   . Hypertension Daughter   . Kidney cancer Neg Hx   . Bladder Cancer Neg Hx   . Colon cancer Neg Hx   . Colon polyps Neg Hx   . Rectal cancer Neg Hx     Social History Social History   Tobacco Use  . Smoking status: Former Smoker    Packs/day: 1.00    Years: 61.00    Pack years: 61.00    Types: Cigarettes    Quit date: 03/25/2017    Years  since quitting: 3.4  . Smokeless tobacco: Never Used  Vaping Use  . Vaping Use: Never used  Substance Use Topics  . Alcohol use: No  . Drug use: No     Allergies   Cefuroxime axetil, Ciprofloxacin, and Codeine   Review of Systems Review of Systems  Constitutional: Negative for fatigue and fever.  Gastrointestinal: Negative for abdominal pain, nausea and vomiting.  Genitourinary: Negative for dysuria, frequency and pelvic pain.  Musculoskeletal: Positive for arthralgias, back pain and gait problem. Negative for joint swelling.  Skin: Negative for color change and wound.  Neurological: Negative for weakness and numbness.  Physical Exam Triage Vital Signs ED Triage Vitals  Enc Vitals Group     BP 09/04/20 1426 (!) 146/67     Pulse Rate 09/04/20 1426 91     Resp 09/04/20 1426 18     Temp 09/04/20 1426 97.7 F (36.5 C)     Temp Source 09/04/20 1426 Oral     SpO2 09/04/20 1426 99 %     Weight 09/04/20 1426 140 lb (63.5 kg)     Height 09/04/20 1426 5\' 7"  (1.702 m)     Head Circumference --      Peak Flow --      Pain Score 09/04/20 1425 7     Pain Loc --      Pain Edu? --      Excl. in [place]? --    No data found.  Updated Vital Signs BP (!) 146/67 (BP Location: Left Arm)   Pulse 91   Temp 97.7 F (36.5 C) (Oral)   Resp 18   Ht 5\' 7"  (1.702 m)   Wt 140 lb (63.5 kg)   SpO2 99%   BMI 21.93 kg/m    Physical Exam Vitals and nursing note reviewed.  Constitutional:      General: She is not in acute distress.    Appearance: Normal appearance. She is not ill-appearing or toxic-appearing.  HENT:     Head: Normocephalic and atraumatic.  Eyes:     General: No scleral icterus.       Right eye: No discharge.        Left eye: No discharge.     Conjunctiva/sclera: Conjunctivae normal.  Cardiovascular:     Rate and Rhythm: Normal rate and regular rhythm.     Heart sounds: Normal heart sounds.  Pulmonary:     Effort: Pulmonary effort is normal. No respiratory  distress.     Breath sounds: Normal breath sounds.  Abdominal:     Palpations: Abdomen is soft.     Tenderness: There is no abdominal tenderness. There is no right CVA tenderness or left CVA tenderness.  Musculoskeletal:     Cervical back: Neck supple.     Lumbar back: Bony tenderness (TTP SI joint and L5) present. Decreased range of motion. Negative right straight leg raise test and negative left straight leg raise test.     Left hip: Bony tenderness (TTP along ASIS and pubic bone on right side. Also TTP throughout groin region. No hernia) present. No deformity. Normal range of motion (painful external and internal rotation). Normal strength.  Skin:    General: Skin is dry.  Neurological:     General: No focal deficit present.     Mental Status: She is alert. Mental status is at baseline.     Motor: No weakness.     Gait: Gait abnormal (ambulating with a walker and not standing completely erect, is flexed forward).  Psychiatric:        Mood and Affect: Mood normal.        Behavior: Behavior normal.        Thought Content: Thought content normal.      UC Treatments / Results  Labs (all labs ordered are listed, but only abnormal results are displayed) Labs Reviewed - No data to display  EKG   Radiology No results found.  Procedures Procedures (including critical care time)  Medications Ordered in UC Medications - No data to display  Initial Impression / Assessment and Plan / UC Course  I have reviewed the  triage vital signs and the nursing notes.  Pertinent labs & imaging results that were available during my care of the patient were reviewed by me and considered in my medical decision making (see chart for details).   80 year old female presenting for groin and hip pain x2 weeks following a slip and fall.  Advised patient on imaging since her symptoms have been ongoing x2 weeks.  Patient is at a higher risk for fractures since she has osteoporosis.  She declines  imaging at this time.  Advised her that it could be a small underlying fracture and I cannot rule that out without imaging study.  Patient would like to continue with conservative therapy at this time and follow-up with PCP, orthopedics or return to the clinic if she is not starting to improve over the next week.  Advised patient this could be a pulled muscle in the groin region and to continue using her walker if it is helpful.  I did prescribe short supply of Ultram after reviewing controlled substance database.  Patient has taken this medication before and done well with it.  Advised her that it can make her little drowsy so she should be careful with this when she takes it.  Advised icing the area and to continue with stretches.  Return and ED precautions reviewed with patient.   Final Clinical Impressions(s) / UC Diagnoses   Final diagnoses:  Right hip pain  Fall, initial encounter  Strain of groin, right, initial encounter     Discharge Instructions     You have declined imaging at this point.  Since we have not done x-rays I cannot completely rule out an underlying fracture or bony abnormality.  At this time we will treat you for possible pulled muscle.  I have sent a pain medication for you to take.  It is okay for you to take Tylenol with this if you need to.  You should follow up with your PCP or orthopedics in the next week if you are still having pain.  If you have had not had any improvement in your condition then you may need imaging.  Follow-up with our department if you need to.  Follow-up at any point if her symptoms worsen.  You have a condition requiring you to follow up with Orthopedics so please call one of the following office for appointment:   [facility] [address] [place], [place] [address] Phone: [phone]  [facility] [address] [place], [place] [address] Phone: [phone]     ED Prescriptions    Medication Sig Dispense Auth. Provider    traMADol (ULTRAM) 50 MG tablet Take 1 tablet (50 mg total) by mouth every 6 (six) hours as needed for up to 5 days. 15 tablet Danton Clap, PA-C     I have reviewed the PDMP during this encounter.   Danton Clap, PA-C 09/05/20 [phone]

## 2020-09-04 NOTE — Discharge Instructions (Signed)
You have declined imaging at this point.  Since we have not done x-rays I cannot completely rule out an underlying fracture or bony abnormality.  At this time we will treat you for possible pulled muscle.  I have sent a pain medication for you to take.  It is okay for you to take Tylenol with this if you need to.  You should follow up with your PCP or orthopedics in the next week if you are still having pain.  If you have had not had any improvement in your condition then you may need imaging.  Follow-up with our department if you need to.  Follow-up at any point if her symptoms worsen.  You have a condition requiring you to follow up with Orthopedics so please call one of the following office for appointment:   [facility] [address] [place], [place] [address] Phone: [phone]  [facility] [address] [place],  [address] Phone: [phone]

## 2020-09-04 NOTE — ED Triage Notes (Signed)
Patient in today c/o right groin pain after falling ~2 weeks ago. Patient states the pain is worse with movement or coughing. Patient states she just finished radiation therapy for lung cancer. Patient states she has taken OTC Tylenol without relief.

## 2020-09-06 ENCOUNTER — Other Ambulatory Visit: Payer: Self-pay

## 2020-09-06 ENCOUNTER — Other Ambulatory Visit: Payer: Self-pay | Admitting: Radiation Oncology

## 2020-09-06 ENCOUNTER — Encounter: Payer: Self-pay | Admitting: Radiation Oncology

## 2020-09-06 ENCOUNTER — Ambulatory Visit
Admission: RE | Admit: 2020-09-06 | Discharge: 2020-09-06 | Disposition: A | Discharge status: Home / Self Care | Payer: Medicare Other | Source: Ambulatory Visit | Attending: Radiation Oncology | Admitting: Radiation Oncology

## 2020-09-06 ENCOUNTER — Other Ambulatory Visit: Payer: Self-pay | Admitting: *Deleted

## 2020-09-06 VITALS — BP 139/85 | HR 98 | Temp 97.9°F | Resp 16 | Wt 145.9 lb

## 2020-09-06 DIAGNOSIS — C3431 Malignant neoplasm of lower lobe, right bronchus or lung: Secondary | ICD-10-CM | POA: Insufficient documentation

## 2020-09-06 DIAGNOSIS — Z923 Personal history of irradiation: Secondary | ICD-10-CM | POA: Diagnosis not present

## 2020-09-06 DIAGNOSIS — M25551 Pain in right hip: Secondary | ICD-10-CM | POA: Diagnosis not present

## 2020-09-06 NOTE — Progress Notes (Signed)
Radiation Oncology Follow up Note  Name: Christine Burnett   Date:   09/06/2020 MRN:  [id number] DOB: May 20, 1941    This 80 y.o. female presents to the clinic today for 1 month follow-up.  Status post hypofractionated treatment to right lower lobe for stage Ib non-small cell lung cancer  REFERRING PROVIDER: Delsa Grana, PA-C  HPI: Patient is an 80 year old female now out 1 month having completed hypofractionated treatment to her right lower lobe for a stage I non-small cell lung cancer.  Seen today in routine follow-up and from a lung standpoint she is doing well specifically [place] cough any dysphagia or chest tightness..  She did fall injured her right hip is not yet had that x-rayed although she is having significant pain in that region.  She has a CT scheduled for next week.  COMPLICATIONS OF TREATMENT: none  FOLLOW UP COMPLIANCE: keeps appointments   PHYSICAL EXAM:  BP 139/85 (BP Location: Left Arm, Patient Position: Sitting)   Pulse 98   Temp 97.9 F (36.6 C) (Tympanic)   Resp 16   Wt 145 lb 14.4 oz (66.2 kg)   BMI 22.85 kg/m  Range of motion right lower extremity is limited secondary to pain.  Well-developed well-nourished patient in NAD. HEENT reveals PERLA, EOMI, discs not visualized.  Oral cavity is clear. No oral mucosal lesions are identified. Neck is clear without evidence of cervical or supraclavicular adenopathy. Lungs are clear to A&P. Cardiac examination is essentially unremarkable with regular rate and rhythm without murmur rub or thrill. Abdomen is benign with no organomegaly or masses noted. Motor sensory and DTR levels are equal and symmetric in the upper and lower extremities. Cranial nerves II through XII are grossly intact. Proprioception is intact. No peripheral adenopathy or edema is identified. No motor or sensory levels are noted. Crude visual fields are within normal range.  RADIOLOGY RESULTS: Plain films of her right hip ordered  PLAN: Present time patient is  doing well from a lung standpoint.  Have ordered a plain film to rule out fracture of her right hip or any pathology.  She has a CT scheduled for next week of asked her to copy me her results.  Otherwise am pleased with her overall progress of asked to see her on 3 to 4 months for follow-up.  She continues close follow-up care with medical oncology.  I would like to take this opportunity to thank you for allowing me to participate in the care of your patient.Noreene Filbert, MD

## 2020-09-07 ENCOUNTER — Telehealth: Payer: Self-pay | Admitting: *Deleted

## 2020-09-07 NOTE — Telephone Encounter (Addendum)
Pt called asking about results of xray.  It does show a fracture of the pelvis.   Pt told to call her orthopedic doctor, Dr. Rudene Christians and try to get appt. Asap.  She acknowledged and will call now to schedule an appt.

## 2020-09-07 NOTE — Telephone Encounter (Signed)
Called report  IMPRESSION: 1. Acute fracture of the right superior pubic ramus. 2. Acute to subacute fracture of the right inferior pubic ramus. The inferior pubic ramus fracture is somewhat atypical in appearance which may be due to different stage of healing, however given patient's history of lung cancer, pathologic fracture should also be included in the differential.  These results will be called to the ordering clinician or representative by the Radiologist Assistant, and communication documented in the PACS or [employer].   Electronically Signed   By: Miachel Roux M.D.   On: 09/07/2020 10:01

## 2020-09-14 DIAGNOSIS — R102 Pelvic and perineal pain: Secondary | ICD-10-CM | POA: Diagnosis not present

## 2020-09-14 DIAGNOSIS — S32810A Multiple fractures of pelvis with stable disruption of pelvic ring, initial encounter for closed fracture: Secondary | ICD-10-CM | POA: Diagnosis not present

## 2020-09-16 ENCOUNTER — Other Ambulatory Visit: Payer: Self-pay

## 2020-09-16 ENCOUNTER — Ambulatory Visit
Admission: RE | Admit: 2020-09-16 | Discharge: 2020-09-16 | Disposition: A | Discharge status: Home / Self Care | Payer: Medicare Other | Source: Ambulatory Visit | Attending: Oncology | Admitting: Oncology

## 2020-09-16 DIAGNOSIS — J432 Centrilobular emphysema: Secondary | ICD-10-CM | POA: Diagnosis not present

## 2020-09-16 DIAGNOSIS — C3491 Malignant neoplasm of unspecified part of right bronchus or lung: Secondary | ICD-10-CM | POA: Diagnosis not present

## 2020-09-16 DIAGNOSIS — I2510 Atherosclerotic heart disease of native coronary artery without angina pectoris: Secondary | ICD-10-CM | POA: Diagnosis not present

## 2020-09-16 DIAGNOSIS — C3431 Malignant neoplasm of lower lobe, right bronchus or lung: Secondary | ICD-10-CM | POA: Diagnosis not present

## 2020-09-16 DIAGNOSIS — J948 Other specified pleural conditions: Secondary | ICD-10-CM | POA: Diagnosis not present

## 2020-09-17 ENCOUNTER — Encounter: Payer: Self-pay | Admitting: Oncology

## 2020-09-17 ENCOUNTER — Inpatient Hospital Stay: Payer: Medicare Other | Attending: Oncology | Admitting: Oncology

## 2020-09-17 VITALS — BP 106/72 | HR 93 | Temp 98.3°F | Resp 20 | Wt 146.8 lb

## 2020-09-17 DIAGNOSIS — M810 Age-related osteoporosis without current pathological fracture: Secondary | ICD-10-CM | POA: Insufficient documentation

## 2020-09-17 DIAGNOSIS — Z79899 Other long term (current) drug therapy: Secondary | ICD-10-CM | POA: Insufficient documentation

## 2020-09-17 DIAGNOSIS — E785 Hyperlipidemia, unspecified: Secondary | ICD-10-CM | POA: Insufficient documentation

## 2020-09-17 DIAGNOSIS — F419 Anxiety disorder, unspecified: Secondary | ICD-10-CM | POA: Diagnosis not present

## 2020-09-17 DIAGNOSIS — N2581 Secondary hyperparathyroidism of renal origin: Secondary | ICD-10-CM | POA: Insufficient documentation

## 2020-09-17 DIAGNOSIS — Z87891 Personal history of nicotine dependence: Secondary | ICD-10-CM | POA: Insufficient documentation

## 2020-09-17 DIAGNOSIS — C3431 Malignant neoplasm of lower lobe, right bronchus or lung: Secondary | ICD-10-CM | POA: Diagnosis not present

## 2020-09-17 DIAGNOSIS — J449 Chronic obstructive pulmonary disease, unspecified: Secondary | ICD-10-CM | POA: Diagnosis not present

## 2020-09-17 DIAGNOSIS — Z8673 Personal history of transient ischemic attack (TIA), and cerebral infarction without residual deficits: Secondary | ICD-10-CM | POA: Insufficient documentation

## 2020-09-17 DIAGNOSIS — F32A Depression, unspecified: Secondary | ICD-10-CM | POA: Insufficient documentation

## 2020-09-17 DIAGNOSIS — N183 Chronic kidney disease, stage 3 unspecified: Secondary | ICD-10-CM | POA: Insufficient documentation

## 2020-09-20 NOTE — Progress Notes (Signed)
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 She was admitted to the hospital in October 2021 for Covid and was found to have a masslike opacity in the right infrahilar lung. At that point a repeat chest x-ray in 6 weeks was recommended. X-ray in November 2021 showed persistent round opacity in the right lower lobe which was followed up with a CT chest without contrast. It showed a 4 x 3.5 cm lobulated and spiculated mass in the right lower lobe surrounding the right lower lobe bronchial tree. Associated soft tissue within the bronchial tree which may represent local ingrowth. This was followed by a bronchoscopy and bronchoalveolar lavage from the right lower lobe was consistent with non-small cell lung cancer. Neoplastic cells were negative for p40 TTF-1 and CD56.   Further characterization between squamous and adenocarcinoma could not be obtained.  PET CT scan showed a 4.2 cm right lower lobe lung mass with an SUV of 10.  No hypermetabolic hilar or mediastinal adenopathy.  No evidence of distant metastatic disease.  Tiny nodule at the minor fissure of the right chest potential on follow-up.  Mild aneurysmal abdominal aorta recommend follow-up every 3 years.  Interval history-patient had some pain  during swallowing at the time of radiation which is currently resolved.  She feels back to her baseline  ECOG PS- 1 Pain scale- 0   Review of systems- Review of Systems  Constitutional: Negative for chills, fever, malaise/fatigue and weight loss.  HENT: Negative for congestion, ear discharge and nosebleeds.   Eyes: Negative for blurred vision.  Respiratory: Negative for cough, hemoptysis, sputum production, shortness of breath and wheezing.   Cardiovascular: Negative for chest pain, palpitations, orthopnea and claudication.  Gastrointestinal: Negative for abdominal pain, blood in stool, constipation, diarrhea, heartburn, melena, nausea and vomiting.  Genitourinary: Negative for dysuria, flank pain, frequency, hematuria and urgency.  Musculoskeletal: Negative for back pain, joint pain and myalgias.  Skin: Negative for rash.  Neurological: Negative for dizziness, tingling, focal weakness, seizures, weakness and headaches.  Endo/Heme/Allergies: Does not bruise/bleed easily.  Psychiatric/Behavioral: Negative for depression and suicidal ideas. The patient does not have insomnia.       Allergies  Allergen Reactions  . Cefuroxime Axetil     caused rash.  . Ciprofloxacin Nausea Only    Unknown  . Codeine Other (See Comments)    Patient does not like the way this makes her feel     Past Medical History:  Diagnosis Date  . Allergic rhinitis   . Anxiety   . Chronic bronchitis ([place])   . Chronic kidney disease, stage III (moderate) ([place]) 08/31/2017   Seeing nephrologist  . CKD (chronic kidney disease), stage III ([place])   . COPD (chronic obstructive pulmonary disease) ([place])   . Cornea scar   . Depression    unspecified  . Diverticulosis   . Hx of fracture of left hip 12/24/2017  .  Hyperlipidemia   . 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 Non-small cell lung cancer (NSCLC) ([place])   . Osteoporosis   . Risk for falls   . Situational  disturbance   . Stroke ([place]) 2011   tia's x 2  . TIA (transient ischemic attack)      Past Surgical History:  Procedure Laterality Date  . COLONOSCOPY  2016  . ESOPHAGUS SURGERY    . INTRAMEDULLARY (IM) NAIL INTERTROCHANTERIC Left 03/26/2017   Procedure: INTRAMEDULLARY (IM) NAIL INTERTROCHANTRIC;  Surgeon: Hessie Knows, MD;  Location: [facility] ORS;  Service: Orthopedics;  Laterality: Left;  Marland Kitchen VIDEO BRONCHOSCOPY WITH ENDOBRONCHIAL NAVIGATION Right 06/28/2020   Procedure: VIDEO BRONCHOSCOPY WITH ENDOBRONCHIAL NAVIGATION CELLVIZIO;  Surgeon: Tyler Pita, MD;  Location: [facility] ORS;  Service: Pulmonary;  Laterality: Right;    Social History   Socioeconomic History  . Marital status: Divorced    Spouse name: Not on file  . Number of children: 4  . Years of education: Not on file  . Highest education level: 12th grade  Occupational History  . Occupation: Retired    Comment: Occupational hygienist  . Smoking status: Former Smoker    Packs/day: 1.00    Years: 61.00    Pack years: 61.00    Types: Cigarettes    Quit date: 03/25/2017    Years since quitting: 3.4  . Smokeless tobacco: Never Used  Vaping Use  . Vaping Use: Never used  Substance and Sexual Activity  . Alcohol use: No  . Drug use: No  . Sexual activity: Not Currently  Other Topics Concern  . Not on file  Social History Narrative   Full Code   4 children   Denies alcohol use   Patient has 2 daughters and 1 grandson living with her   Social Determinants of Health   Financial Resource Strain: Low Risk   . Difficulty of Paying Living Expenses: Not hard at all  Food Insecurity: No Food Insecurity  . Worried About Charity fundraiser in the Last Year: Never true  . Ran Out of Food in the Last Year: Never true  Transportation Needs: No Transportation Needs  . Lack of Transportation (Medical): No  . Lack of Transportation (Non-Medical): No  Physical Activity: Inactive  . Days of Exercise per Week: 0 days  .  Minutes of Exercise per Session: 0 min  Stress: Stress Concern Present  . Feeling of Stress : Rather much  Social Connections: Moderately Isolated  . Frequency of Communication with Friends and Family: More than three times a week  . Frequency of Social Gatherings with Friends and Family: More than three times a week  . Attends Religious Services: More than 4 times per year  . Active Member of Clubs or Organizations: No  . Attends Archivist Meetings: Never  . Marital Status: Divorced  Human resources officer Violence: Not At Risk  . Fear of Current or Ex-Partner: No  . Emotionally Abused: No  . Physically Abused: No  . Sexually Abused: No    Family History  Problem Relation Age of Onset  . Other Mother        [place] posioning  . Heart attack Father   . Prostate cancer Father   . Heart attack Brother   . Diabetes Brother   . Hypertension Daughter   . Kidney cancer Neg Hx   . Bladder Cancer Neg Hx   . Colon cancer Neg Hx   .  Colon polyps Neg Hx   . Rectal cancer Neg Hx      Current Outpatient Medications:  .  acetaminophen (TYLENOL) 650 MG CR tablet, Take 1 tablet (650 mg total) by mouth 4 (four) times daily as needed for pain., Disp: , Rfl:  .  albuterol (VENTOLIN HFA) 108 (90 Base) MCG/ACT inhaler, Inhale 2 puffs into the lungs every 6 (six) hours as needed for wheezing or shortness of breath., Disp: 1 each, Rfl: 0 .  aspirin 81 MG EC tablet, Take 81 mg by mouth daily. Swallow whole., Disp: , Rfl:  .  atorvastatin (LIPITOR) 10 MG tablet, Take 1 tablet (10 mg total) by mouth at bedtime., Disp: 90 tablet, Rfl: 3 .  Cholecalciferol (VITAMIN D-3) 1000 units CAPS, Take 2,000 Units by mouth daily. Takes occassionally, Disp: , Rfl:  .  melatonin 3 MG TABS tablet, Take 3 mg by mouth at bedtime., Disp: , Rfl:  .  PARoxetine (PAXIL) 40 MG tablet, Take 1 tablet (40 mg total) by mouth every morning., Disp: 90 tablet, Rfl: 3 .  Probiotic Product (PROBIOTIC-10 PO), Take 1 capsule by  mouth daily., Disp: , Rfl:  .  magic mouthwash (lidocaine, diphenhydrAMINE, alum & mag hydroxide) suspension, Swish and swallow 5 mLs 4 (four) times daily as needed for mouth pain. (Patient not taking: Reported on 09/17/2020), Disp: , Rfl:   Physical exam:  Vitals:   09/17/20 1449  BP: 106/72  Pulse: 93  Resp: 20  Temp: 98.3 F (36.8 C)  TempSrc: Tympanic  SpO2: 99%  Weight: 146 lb 12.8 oz (66.6 kg)   Physical Exam Constitutional:      General: She is not in acute distress. Cardiovascular:     Rate and Rhythm: Normal rate and regular rhythm.     Heart sounds: Normal heart sounds.  Pulmonary:     Effort: Pulmonary effort is normal.     Breath sounds: Normal breath sounds.  Skin:    General: Skin is warm and dry.  Neurological:     Mental Status: She is alert and oriented to person, place, and time.      CMP Latest Ref Rng & Units 05/03/2020  Glucose 65 - 99 mg/dL 98  BUN 7 - 25 mg/dL 25  Creatinine 0.60 - 0.88 mg/dL 1.54(H)  Sodium 135 - 146 mmol/L 140  Potassium 3.5 - 5.3 mmol/L 5.0  Chloride 98 - 110 mmol/L 105  CO2 20 - 32 mmol/L 25  Calcium 8.6 - 10.4 mg/dL 10.1  Total Protein 6.1 - 8.1 g/dL 7.4  Total Bilirubin 0.2 - 1.2 mg/dL 0.5  Alkaline Phos 38 - 126 U/L -  AST 10 - 35 U/L 15  ALT 6 - 29 U/L 11   CBC Latest Ref Rng & Units 08/02/2020  WBC 4.0 - 10.5 K/uL 6.3  Hemoglobin 12.0 - 15.0 g/dL 12.5  Hematocrit 36.0 - 46.0 % 37.9  Platelets 150 - 400 K/uL 204    No images are attached to the encounter.  CT Chest Wo Contrast  Result Date: 09/17/2020 CLINICAL DATA:  Two-month follow-up of non-small-cell lung cancer. Status post radiation therapy. EXAM: CT CHEST WITHOUT CONTRAST TECHNIQUE: Multidetector CT imaging of the chest was performed following the standard protocol without IV contrast. COMPARISON:  07/13/2020 PET. FINDINGS: Cardiovascular: Aortic and branch vessel atherosclerosis. Normal heart size, without pericardial effusion. Multivessel coronary artery  atherosclerosis. Mediastinum/Nodes: No mediastinal or definite hilar adenopathy, given limitations of unenhanced CT. Lungs/Pleura: No pleural fluid.  Moderate centrilobular emphysema. Nodule along  the right minor fissure of 3 mm on 83/3 is unchanged. Right lower lobe spiculated lung nodule measures 2.9 x 2.3 cm on 101/3 versus 4.1 x 3.6 cm on 05/21/2020. A "pleural tag" is identified on 109/3. Upper Abdomen: Normal imaged portions of the liver, spleen, stomach, pancreas, gallbladder, adrenal glands. Right renal vascular calcifications. 3 mm upper pole left renal collecting system calculus. Musculoskeletal: Osteopenia. Superior endplate compression deformity at L1 is similar and mild. IMPRESSION: 1. Response to therapy of right lower lobe primary bronchogenic carcinoma. 2. No thoracic adenopathy or evidence of metastatic disease. 3. A nodule along the right minor fissure of 3 mm is unchanged, likely benign. 4. Aortic atherosclerosis (ICD10-I70.0), coronary artery atherosclerosis and emphysema (ICD10-J43.9). 5. Left nephrolithiasis. Electronically Signed   By: Abigail Miyamoto M.D.   On: 09/17/2020 12:27   DG HIP UNILAT WITH PELVIS 2-3 VIEWS RIGHT  Result Date: 09/07/2020 CLINICAL DATA:  Right hip pain after fall 3 weeks ago History of lung cancer EXAM: DG HIP (WITH OR WITHOUT PELVIS) 2-3V RIGHT COMPARISON:  None. FINDINGS: Degenerative changes of the visualized lower lumbar spine. Moderate left hip osteoarthrosis. Visualized portions of the left hip fixation hardware intact without periprosthetic fracture or lucency. There is an acute fracture of the right superior pubic ramus. There is an acute to subacute fracture of the right inferior pubic ramus. Mild right hip osteoarthrosis. IMPRESSION: 1. Acute fracture of the right superior pubic ramus. 2. Acute to subacute fracture of the right inferior pubic ramus. The inferior pubic ramus fracture is somewhat atypical in appearance which may be due to different stage of  healing, however given patient's history of lung cancer, pathologic fracture should also be included in the differential. These results will be called to the ordering clinician or representative by the Radiologist Assistant, and communication documented in the PACS or [employer]. Electronically Signed   By: Miachel Roux M.D.   On: 09/07/2020 10:01     Assessment and plan- Patient is a 80 y.o. female with newly diagnosed stage IIa non-small cell lung cancer of the right lower lobe cT2 acN0 cM0 she is here to discuss CT scan results and further management  I have reviewed CT chest images independently and discussed with the patient.Right lower lobe mass which was previously 4.1 x 3.6 cm down to 2.9 x 2.3 cm.  No evidence of local or regional adenopathy.  She was also noted to have a 3 mm nodule around the right minor fissure which has remained stable.  Overall she has had good response to radiation.  Explained to the patient that residual lung mass seen on CT scan does not necessarily mean active cancer.  We will monitor this with a repeat CT chest without contrast in 4 months and I will see her thereafter with labs   Visit Diagnosis 1. Malignant neoplasm of lower lobe of right lung ([place])      Dr. Randa Evens, MD, MPH [facility] [phone] 09/20/2020 8:15 AM

## 2020-10-12 ENCOUNTER — Other Ambulatory Visit: Payer: Self-pay | Admitting: Family Medicine

## 2020-10-15 ENCOUNTER — Telehealth: Payer: Self-pay | Admitting: Family Medicine

## 2020-10-15 ENCOUNTER — Other Ambulatory Visit: Payer: Self-pay | Admitting: Family Medicine

## 2020-10-15 NOTE — Telephone Encounter (Signed)
Copied from [place] [id number]. Topic: Quick Communication - Rx Refill/Question >> Oct 15, 2020 12:02 PM Leward Quan A wrote: Medication: hydrOXYzine (ATARAX/VISTARIL) 10 MG tablet  Has the patient contacted their pharmacy? Yes.   (Agent: If no, request that the patient contact the pharmacy for the refill.) (Agent: If yes, when and what did the pharmacy advise?)  Preferred Pharmacy (with phone number or street name): [place], [place] - [place]  Phone:  [phone] Fax:  [phone]     Agent: Please be advised that RX refills may take up to 3 business days. We ask that you follow-up with your pharmacy.

## 2020-10-15 NOTE — Telephone Encounter (Signed)
I refused rx because it had been discontinued? Please advise

## 2020-10-15 NOTE — Telephone Encounter (Signed)
Pt has an appt on 11/09/20

## 2020-11-09 ENCOUNTER — Ambulatory Visit: Payer: Medicare Other | Admitting: Family Medicine

## 2020-11-11 ENCOUNTER — Other Ambulatory Visit: Payer: Self-pay

## 2020-11-11 ENCOUNTER — Ambulatory Visit
Admission: RE | Admit: 2020-11-11 | Discharge: 2020-11-11 | Disposition: A | Discharge status: Home / Self Care | Payer: Medicare Other | Source: Ambulatory Visit | Attending: Radiation Oncology | Admitting: Radiation Oncology

## 2020-11-11 ENCOUNTER — Telehealth: Payer: Self-pay | Admitting: Pulmonary Disease

## 2020-11-11 ENCOUNTER — Encounter: Payer: Self-pay | Admitting: Radiation Oncology

## 2020-11-11 VITALS — BP 142/89 | HR 122 | Temp 98.7°F | Wt 143.5 lb

## 2020-11-11 DIAGNOSIS — Z87891 Personal history of nicotine dependence: Secondary | ICD-10-CM | POA: Diagnosis not present

## 2020-11-11 DIAGNOSIS — R059 Cough, unspecified: Secondary | ICD-10-CM | POA: Diagnosis not present

## 2020-11-11 DIAGNOSIS — Z923 Personal history of irradiation: Secondary | ICD-10-CM | POA: Insufficient documentation

## 2020-11-11 DIAGNOSIS — C3431 Malignant neoplasm of lower lobe, right bronchus or lung: Secondary | ICD-10-CM | POA: Diagnosis not present

## 2020-11-11 NOTE — Telephone Encounter (Signed)
Spoke to patient, who stated that she f/u with Dr. Donella Stade today and was instructed to f/u with Dr. Patsey Berthold for respiratory sx.   C/o non prod cough, wheezing and sob with exertion. Sx have been present for 1 month.  Denied f/c/s.  She picked up mucinex today, however she recalls taking this previously and it made her sick. She is willing to try it.  She is requesting sooner appt then 12/23/2020.  Dr. Patsey Berthold, please advise. Thanks

## 2020-11-11 NOTE — Telephone Encounter (Signed)
Lm for patient.  

## 2020-11-11 NOTE — Progress Notes (Signed)
Radiation Oncology Follow up Note  Name: Christine Burnett   Date:   11/11/2020 MRN:  [id number] DOB: 1941-06-04    This 80 y.o. female presents to the clinic today for self-referral for persistent cough in patient with known 4 cm right lower lobe non-small cell lung cancer status post SBRT now out over 3 months.  REFERRING PROVIDER: Delsa Grana, PA-C  HPI: Patient is an 80 year old female now out over 3 months having completed SBRT to her right lower lobe for a 4 x 3.5 cm non-small cell lung cancer.  She is seen today at her own request secondary to again progressive nonproductive cough she specifically denies hemoptysis.  She has had scans back in April showing excellent response to therapy with over the greater then 50% shrinkage of the mass with no evidence of thoracic adenopathy or evidence of metastatic disease.  Patient has tried Mucinex although she states that she is allergic to it.  She is not yet seen a pulmonologist.  She is currently on of albuterol inhaler.  Her history does states she had a nonproductive cough prior to radiation therapy.  COMPLICATIONS OF TREATMENT: none  FOLLOW UP COMPLIANCE: keeps appointments   PHYSICAL EXAM:  BP (!) 142/89   Pulse (!) 122   Temp 98.7 F (37.1 C) (Tympanic)   Wt 143 lb 8 oz (65.1 kg)   BMI 22.48 kg/m  Well-developed well-nourished patient in NAD. HEENT reveals PERLA, EOMI, discs not visualized.  Oral cavity is clear. No oral mucosal lesions are identified. Neck is clear without evidence of cervical or supraclavicular adenopathy. Lungs are clear to A&P. Cardiac examination is essentially unremarkable with regular rate and rhythm without murmur rub or thrill. Abdomen is benign with no organomegaly or masses noted. Motor sensory and DTR levels are equal and symmetric in the upper and lower extremities. Cranial nerves II through XII are grossly intact. Proprioception is intact. No peripheral adenopathy or edema is identified. No motor or  sensory levels are noted. Crude visual fields are within normal range.  RADIOLOGY RESULTS: CT scan reviewed compatible with above-stated findings  PLAN: Present time I have referred her to Dr. Patsey Berthold for pulmonary tune up.  I have also asked her to try the Mucinex again which may help with her cough.  She is quite comfortable seeing Dr. Patsey Berthold as though she is not been formally evaluated by pulmonology and this may certainly help all of her pulmonary functions.  She has a follow-up CT scan in August and I will see her in follow-up shortly thereafter.  Patient is to call with any concerns.  I would like to take this opportunity to thank you for allowing me to participate in the care of your patient.Noreene Filbert, MD

## 2020-11-11 NOTE — Telephone Encounter (Signed)
Lm x2 for patient.

## 2020-11-11 NOTE — Telephone Encounter (Signed)
Lets try a prednisone taper number 21 tablets take as directed.  Also Azithromycin Z-Pak.  See if maybe she can be seen sooner by NP.

## 2020-11-12 MED ORDER — PREDNISONE 10 MG (21) PO TBPK: ORAL_TABLET | ORAL | 0 refills | Status: DC

## 2020-11-12 MED ORDER — AZITHROMYCIN 250 MG PO TABS: ORAL_TABLET | ORAL | 0 refills | Status: AC

## 2020-11-12 NOTE — Telephone Encounter (Signed)
Lm x3 for patient pt.  Will call once more due to nature of call.

## 2020-11-12 NOTE — Telephone Encounter (Signed)
Patient is aware of recommendations and voiced her understanding.  Prednisone and zpak has been sent to preferred pharmacy.  Patient would like to keep scheduled visit for 12/23/2020 with Dr. Patsey Berthold. Nothing further needed at this time.

## 2020-11-29 ENCOUNTER — Other Ambulatory Visit: Payer: Self-pay

## 2020-11-29 ENCOUNTER — Ambulatory Visit
Admission: EM | Admit: 2020-11-29 | Discharge: 2020-11-29 | Disposition: A | Discharge status: Home / Self Care | Payer: Medicare Other | Attending: Family Medicine | Admitting: Family Medicine

## 2020-11-29 ENCOUNTER — Telehealth: Payer: Self-pay | Admitting: Pulmonary Disease

## 2020-11-29 ENCOUNTER — Ambulatory Visit (INDEPENDENT_AMBULATORY_CARE_PROVIDER_SITE_OTHER): Payer: Medicare Other

## 2020-11-29 DIAGNOSIS — R059 Cough, unspecified: Secondary | ICD-10-CM

## 2020-11-29 DIAGNOSIS — R0602 Shortness of breath: Secondary | ICD-10-CM

## 2020-11-29 DIAGNOSIS — J432 Centrilobular emphysema: Secondary | ICD-10-CM | POA: Diagnosis not present

## 2020-11-29 DIAGNOSIS — J439 Emphysema, unspecified: Secondary | ICD-10-CM | POA: Diagnosis not present

## 2020-11-29 MED ORDER — FLUTICASONE FUROATE-VILANTEROL 100-25 MCG/INH IN AEPB: 1.0000 | INHALATION_SPRAY | Freq: Every day | RESPIRATORY_TRACT | 1 refills | Status: DC

## 2020-11-29 MED ORDER — BENZONATATE 200 MG PO CAPS: 200.0000 mg | ORAL_CAPSULE | Freq: Three times a day (TID) | ORAL | 0 refills | Status: DC | PRN

## 2020-11-29 MED ORDER — PREDNISONE 10 MG PO TABS: ORAL_TABLET | ORAL | 0 refills | Status: DC

## 2020-11-29 NOTE — Discharge Instructions (Addendum)
Medications as prescribed.  Follow up with Pulmonology.  Take care  Dr. Lacinda Axon

## 2020-11-29 NOTE — Telephone Encounter (Signed)
Patient is aware of below message/recommendations and voiced her understanding.  Nothing further needed.  

## 2020-11-29 NOTE — ED Provider Notes (Signed)
[facility]    CSN: [id number] Arrival date & time: 11/29/20  1435      History   Chief Complaint Chief Complaint  Patient presents with   Shortness of Breath   Nasal Congestion   HPI  80 year old female presents with shortness of breath.  Patient reports ongoing cough and shortness of breath.  She states that she has been symptomatic since earlier this month.  She was treated by pulmonology on 6/9 with azithromycin and prednisone.  Patient states that she did not have any significant improvement with the medication.  She reports continued cough.  Cough is dry.  Associated shortness of breath.  Patient has known emphysema per CT imaging.  Also has lung cancer and is currently receiving radiation therapy.  Patient is not on any maintenance inhalers.  She is only using albuterol as needed.  Denies fever.   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   Managed by nephrologist   Non-small cell lung cancer (NSCLC) ([place])    Osteoporosis    Risk for falls    Situational disturbance    Stroke [facility]) 2011   tia's x 2   TIA (transient ischemic attack)     Patient Active Problem List   Diagnosis Date Noted   Goals of care, counseling/discussion 07/17/2020   Malignant neoplasm of lower lobe of right lung ([place]) 07/04/2020   Pneumonia due to COVID-19 virus 03/17/2020   Hypoxia 03/17/2020   Prediabetes 08/13/2018   Major depressive disorder, 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/13/2018   Allergic rhinitis 09/27/2017   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   Stage 3b chronic  kidney disease ([place]) 08/31/2017   Hyperparathyroidism, 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   Osteoporosis without current pathological fracture 03/30/2017   Cardiac murmur 04/07/2015   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 stage 4 chronic kidney disease, or unspecified chronic kidney disease 02/10/2015   Anxiety 01/05/2015   Hyperlipidemia 01/05/2015   HTN (hypertension) 09/24/2014    Past Surgical History:  Procedure Laterality Date   COLONOSCOPY  2016   ESOPHAGUS SURGERY     INTRAMEDULLARY (IM) NAIL INTERTROCHANTERIC Left 03/26/2017   Procedure: INTRAMEDULLARY (IM) NAIL INTERTROCHANTRIC;  Surgeon: Hessie Knows, MD;  Location: [facility] ORS;  Service: Orthopedics;  Laterality: Left;   VIDEO BRONCHOSCOPY WITH ENDOBRONCHIAL NAVIGATION Right 06/28/2020   Procedure: VIDEO BRONCHOSCOPY WITH ENDOBRONCHIAL NAVIGATION CELLVIZIO;  Surgeon: Tyler Pita, MD;  Location: [facility] ORS;  Service: Pulmonary;  Laterality: Right;    OB History   No obstetric history on file.      Home Medications    Prior to Admission medications   Medication Sig Start Date End Date Taking? Authorizing Provider  benzonatate (TESSALON) 200 MG capsule Take 1 capsule (200 mg total) by mouth 3 (three) times daily as needed for cough. 11/29/20  Yes Teyton Pattillo G, DO  fluticasone furoate-vilanterol (BREO ELLIPTA) 100-25 MCG/INH AEPB Inhale 1 puff into the lungs daily. 11/29/20  Yes Nadene Witherspoon G, DO  predniSONE (DELTASONE) 10 MG tablet 50 mg daily  x 2 days, then 40 mg daily x 2 days, then 30 mg daily x 2 days, then 20 mg daily x 2 days, then 10 mg daily x 2 days. 11/29/20  Yes Elam Ellis G, DO  acetaminophen (TYLENOL) 650 MG CR tablet Take 1 tablet (650 mg total) by mouth 4 (four) times daily as needed for pain. 01/30/20   Delsa Grana, PA-C  albuterol (VENTOLIN HFA) 108 (90 Base) MCG/ACT inhaler Inhale 2 puffs into the lungs every 6 (six) hours as needed for wheezing or shortness of breath. 03/16/20   Delsa Grana, PA-C  aspirin 81 MG EC tablet Take 81 mg by mouth daily. Swallow whole.    [provider]  atorvastatin (LIPITOR) 10 MG tablet Take 1 tablet (10 mg total) by mouth at bedtime. 01/30/20   Delsa Grana, PA-C  Cholecalciferol (VITAMIN D-3) 1000 units CAPS Take 2,000 Units by mouth daily. Takes occassionally 08/09/17   Arnetha Courser, MD  magic mouthwash (lidocaine, diphenhydrAMINE, alum & mag hydroxide) suspension Swish and swallow 5 mLs 4 (four) times daily as needed for mouth pain. Patient not taking: No sig reported    [provider]  melatonin 3 MG TABS tablet Take 3 mg by mouth at bedtime.    [provider]  Probiotic Product (PROBIOTIC-10 PO) Take 1 capsule by mouth daily.    [provider]    Family History Family History  Problem Relation Age of Onset   Other Mother        [place] posioning   Heart attack Father    Prostate cancer Father    Heart attack Brother    Diabetes Brother    Hypertension Daughter    Kidney cancer Neg Hx    Bladder Cancer Neg Hx    Colon cancer Neg Hx    Colon polyps Neg Hx    Rectal cancer Neg Hx     Social History Social History   Tobacco Use   Smoking status: Former    Packs/day: 1.00    Years: 61.00    Pack years: 61.00    Types: Cigarettes    Quit date: 03/25/2017    Years since quitting: 3.6   Smokeless tobacco: Never  Vaping Use   Vaping Use: Never used  Substance Use Topics   Alcohol use: No   Drug use: No     Allergies   Cefuroxime axetil, Ciprofloxacin, and Codeine   Review of Systems Review of Systems  Constitutional:  Negative for fever.  Respiratory:  Positive for cough and shortness of breath.     Physical Exam Triage Vital Signs ED Triage Vitals  Enc Vitals Group     BP 11/29/20 1506 140/85     Pulse Rate 11/29/20 1506 99     Resp 11/29/20 1506 18     Temp 11/29/20 1506 98.7 F (37.1 C)     Temp Source 11/29/20 1506 Oral     SpO2 11/29/20 1506 100 %     Weight  --      Height --      Head Circumference --      Peak Flow --      Pain Score 11/29/20 1501 0     Pain Loc --      Pain Edu? --      Excl. in [place]? --    Updated Vital Signs BP 140/85 (BP Location: Right Arm)   Pulse 99   Temp 98.7 F (37.1 C) (Oral)  Resp 18   SpO2 100%   Visual Acuity Right Eye Distance:   Left Eye Distance:   Bilateral Distance:    Right Eye Near:   Left Eye Near:    Bilateral Near:     Physical Exam Vitals and nursing note reviewed.  Constitutional:      General: She is not in acute distress.    Appearance: Normal appearance. She is not ill-appearing.  HENT:     Head: Normocephalic and atraumatic.  Cardiovascular:     Rate and Rhythm: Normal rate and regular rhythm.  Pulmonary:     Effort: Pulmonary effort is normal.     Breath sounds: No wheezing or rales.  Neurological:     Mental Status: She is alert.  Psychiatric:        Mood and Affect: Mood normal.        Behavior: Behavior normal.   UC Treatments / Results  Labs (all labs ordered are listed, but only abnormal results are displayed) Labs Reviewed - No data to display  EKG   Radiology DG Chest 2 View  Result Date: 11/29/2020 CLINICAL DATA:  Cough short of breath EXAM: CHEST - 2 VIEW COMPARISON:  06/28/2020, CT 09/16/2020 FINDINGS: Hyperinflation with emphysema and bronchitic changes. Decreased size of previously noted right lower lobe lung mass. Cardiomediastinal silhouette within normal limits. Aortic atherosclerosis. IMPRESSION: 1. Emphysema and mild bronchitic changes.  No acute airspace disease 2. Interval decrease in size of right lower lobe lung mass since prior radiograph from January. Electronically Signed   By: Donavan Foil M.D.   On: 11/29/2020 16:03    Procedures Procedures (including critical care time)  Medications Ordered in UC Medications - No data to display  Initial Impression / Assessment and Plan / UC Course  I have reviewed the triage vital signs and the  nursing notes.  Pertinent labs & imaging results that were available during my care of the patient were reviewed by me and considered in my medical decision making (see chart for details).    80 year old female with COPD and lung cancer presents with shortness of breath and cough.  Chest x-ray was obtained and was independently reviewed by me.  Emphysema noted.  Right lower lobe lung mass improved from prior chest x-ray.  Placing the patient on prednisone and Tessalon Perles.  Starting [facility].  I see no need for antibiotic therapy at this time as her cough is not productive and she has had recent antibiotic therapy with no improvement.  Also, chest x-ray with no evidence of pneumonia.  No significant wheezing or rhonchi on exam.  Follow-up with pulmonology.  Final Clinical Impressions(s) / UC Diagnoses   Final diagnoses:  Shortness of breath  Centrilobular emphysema (HCC)     Discharge Instructions      Medications as prescribed.  Follow up with Pulmonology.  Take care  Dr. Lacinda Axon    ED Prescriptions     Medication Sig Dispense Auth. Provider   fluticasone furoate-vilanterol (BREO ELLIPTA) 100-25 MCG/INH AEPB Inhale 1 puff into the lungs daily. 60 each Ilsa Bonello G, DO   predniSONE (DELTASONE) 10 MG tablet 50 mg daily x 2 days, then 40 mg daily x 2 days, then 30 mg daily x 2 days, then 20 mg daily x 2 days, then 10 mg daily x 2 days. 30 tablet Harper Smoker G, DO   benzonatate (TESSALON) 200 MG capsule Take 1 capsule (200 mg total) by mouth 3 (three) times daily as needed for cough.  30 capsule Coral Spikes, DO      PDMP not reviewed this encounter.   Coral Spikes, [place] 11/29/20 1900

## 2020-11-29 NOTE — Telephone Encounter (Signed)
I recommend that she be seen in the emergency room or urgent care.  She has by definition failed outpatient therapy and may need admission for COPD exacerbation.

## 2020-11-29 NOTE — ED Triage Notes (Addendum)
Patient presents to Urgent Care with complaints of cough and nasal congestion x 3 weeks. She was seen by her PCP for these issue and prescribed steriods with no relief. Pt  called her PCP instructed pt to come to UC for eval. She has a hx of chronic bronchitis and has also used her albuterol with no relief.   Denies fever.

## 2020-11-29 NOTE — Telephone Encounter (Addendum)
Patient was prescribed zpak and prednisone on 11/11/2020 with mild relief in sx.  She reports chest tightness, unable to take deep breath, non prod cough, sob with exertion and wheezing x3w. Denied fever, chills or sweats.  Using albuterol 1-2x daily with no relief.  She is requesting an CXR.   Dr. Patsey Berthold, please advise. Thanks

## 2020-11-30 ENCOUNTER — Ambulatory Visit: Payer: Medicare Other | Admitting: Family Medicine

## 2020-12-13 ENCOUNTER — Ambulatory Visit: Payer: Medicare Other | Admitting: Radiation Oncology

## 2020-12-23 ENCOUNTER — Encounter: Payer: Self-pay | Admitting: Pulmonary Disease

## 2020-12-23 ENCOUNTER — Other Ambulatory Visit: Payer: Self-pay

## 2020-12-23 ENCOUNTER — Ambulatory Visit (INDEPENDENT_AMBULATORY_CARE_PROVIDER_SITE_OTHER): Payer: Medicare Other | Admitting: Pulmonary Disease

## 2020-12-23 VITALS — BP 110/74 | HR 96 | Temp 97.4°F | Ht 67.0 in | Wt 142.0 lb

## 2020-12-23 DIAGNOSIS — R0602 Shortness of breath: Secondary | ICD-10-CM | POA: Diagnosis not present

## 2020-12-23 DIAGNOSIS — J449 Chronic obstructive pulmonary disease, unspecified: Secondary | ICD-10-CM | POA: Diagnosis not present

## 2020-12-23 DIAGNOSIS — C3491 Malignant neoplasm of unspecified part of right bronchus or lung: Secondary | ICD-10-CM

## 2020-12-23 MED ORDER — BREZTRI AEROSPHERE 160-9-4.8 MCG/ACT IN AERO: 2.0000 | INHALATION_SPRAY | Freq: Two times a day (BID) | RESPIRATORY_TRACT | 0 refills | Status: DC

## 2020-12-23 MED ORDER — SPACER/AERO-HOLDING CHAMBERS DEVI: 0 refills | Status: DC

## 2020-12-23 NOTE — Patient Instructions (Signed)
STOP BREO.  We have given you samples of Breztri 2 puffs twice a day, make sure you rinse your mouth well after use.  We have provided you a spacer device to help with the inhaler.  We are ordering breathing tests, a heart test and an overnight oxygen test.  This will help [place] determine about your COPD severity.  We will see you in follow-up in 4 to 6 weeks time with either me or the nurse practitioner.

## 2020-12-23 NOTE — Addendum Note (Signed)
Addended by: Carlisle Cater on: 12/23/2020 10:28 AM   Modules accepted: Orders

## 2020-12-23 NOTE — Progress Notes (Signed)
Subjective:    Patient ID: Christine Burnett, female    DOB: [birth year], 80 y.o.   MRN: [id number] Chief Complaint  Patient presents with   Follow-up    States she feels heavy in her chest,SOB, wheezing, Dizziness, coughing with no production.     HPI She is an 80 year old former smoker (quit 2018, 53 PY) who presents for evaluation of dyspnea and chest tightness.  Previously she had been seen here for evaluation of a right lower lobe lung mass which was noted to be non-small cell carcinoma by bronchoscopic biopsy.  Mass was initially seen in October 2021 during the patient's hospitalization for COVID-19.  She has had SBRT treatment with significant reduction on the mass.  She has not required chemotherapy.  June she started noticing increased congestion and difficulty breathing which was noted during her follow-up with radiation oncology.  She had increased wheezing dyspnea on exertion and cough productive of whitish sputum.  Symptoms were worse on going outside in the hot humid air.  Symptoms are better when she is inside.  She was given a prednisone taper and azithromycin but continued to have issues despite treatment.  She was seen in the emergency room on 2022 and a chest x-ray was done at that time that showed no active disease.  Her lung mass actually had decreased in size significantly.  She was started on Breo Ellipta 100/25, she does not note any improvement with this medication.  She continues to have sensation of chest tightness and shortness of breath.  She has not had any fevers, chills or sweats.  No rapid palpitations.  No lower extremity edema or calf tenderness.  Does not describe any orthopnea or paroxysmal nocturnal dyspnea.  No other complaints voiced.   Review of Systems A 10 point review of systems was performed and it is as noted above otherwise negative.  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   Osteoporosis without current pathological fracture 03/30/2017   Cardiac murmur 04/07/2015   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 stage 4 chronic kidney disease, or unspecified chronic kidney disease 02/10/2015   Anxiety 01/05/2015   Hyperlipidemia 01/05/2015   HTN (hypertension) 09/24/2014   Social History   Tobacco Use   Smoking status: Former    Packs/day: 1.00    Years: 61.00    Pack years: 61.00    Types: Cigarettes    Quit date: 03/25/2017    Years since quitting: 3.7   Smokeless tobacco: Never  Substance Use Topics   Alcohol use: No   Allergies  Allergen Reactions   Cefuroxime Axetil     caused rash.   Ciprofloxacin Nausea Only    Unknown   Codeine Other (See Comments)    Patient does not like the way this makes her feel   Current Meds  Medication Sig   acetaminophen (TYLENOL) 650 MG CR tablet Take 1 tablet (650 mg total) by mouth 4 (four) times daily as needed for pain.   albuterol (VENTOLIN HFA) 108 (90 Base) MCG/ACT inhaler Inhale 2 puffs into the  lungs every 6 (six) hours as needed for wheezing or shortness of breath.   aspirin 81 MG EC tablet Take 81 mg by mouth daily. Swallow whole.   atorvastatin (LIPITOR) 10 MG tablet Take 1 tablet (10 mg total) by mouth at bedtime.   Cholecalciferol (VITAMIN D-3) 1000 units CAPS Take 2,000 Units by mouth daily. Takes occassionally   fluticasone furoate-vilanterol (BREO ELLIPTA) 100-25 MCG/INH AEPB Inhale 1 puff into the lungs daily.   melatonin 3 MG TABS tablet Take 3 mg by mouth at bedtime.   PARoxetine (PAXIL) 40 MG tablet  Take 40 mg by mouth every morning.   Probiotic Product (PROBIOTIC-10 PO) Take 1 capsule by mouth daily.   Immunization History  Administered Date(s) Administered   Fluad Quad(high Dose 65+) 06/27/2019   Influenza Split 03/15/2010, 03/21/2011   Influenza, High Dose Seasonal PF 04/07/2015, 03/22/2016, 03/13/2017, 02/25/2018   Influenza,inj,Quad PF,6+ Mos 03/19/2013, 03/10/2014   Pneumococcal Conjugate-13 11/08/2017   Pneumococcal Polysaccharide-23 01/21/2019   Tdap 05/29/2017       Objective:   Physical Exam BP 110/74 (BP Location: Left Arm, Patient Position: Sitting, Cuff Size: Normal)   Pulse 96   Temp (!) 97.4 F (36.3 C) (Oral)   Ht 5\' 7"  (1.702 m)   Wt 142 lb (64.4 kg)   SpO2 95%   BMI 22.24 kg/m  GENERAL: Well-developed, well-nourished elderly woman, in no acute distress. She presents in transport chair due to knee pain. No conversational dyspnea. HEAD: Normocephalic, atraumatic. EYES: Opacity over left eye, right eye with normal pupillary reflex.  No scleral icterus. MOUTH: Nose/mouth/throat not examined due to masking requirements for COVID 19. NECK: Supple. No thyromegaly. Trachea midline. No JVD.  No adenopathy. PULMONARY: Good air entry bilaterally.  No adventitious sounds. CARDIOVASCULAR: S1 and S2. Regular rate and rhythm,HR 96. No rubs, murmurs or gallops heard. ABDOMEN: Benign. MUSCULOSKELETAL: No joint deformity, no clubbing, no edema. NEUROLOGIC: No overt focal deficit. Speech is fluent. Fully oriented.  SKIN: Intact,warm,dry. PSYCH: Mood and behavior normal.  Chest x-ray obtained 29 November 2020 showing markedly decreased right lower lobe lung nodule, emphysematous changes and air trapping:   Ambulatory oximetry was performed today: Patient is oxygen saturation at baseline 95% nadir with exercise was 90% patient was able to ambulate 750 feet without difficulty.     Assessment & Plan:     ICD-10-CM   1. COPD, severity to be determined ([place])  J44.9  ECHOCARDIOGRAM COMPLETE    Pulmonary Function Test [facility] Only    Pulse oximetry, overnight   PFTs to determine severity Discontinue Breo Trial of Breztri 2 puffs twice a day    2. Shortness of breath  R06.02 ECHOCARDIOGRAM COMPLETE    Pulse oximetry, overnight   Likely due to poorly compensated COPD Suspect air trapping as etiology Will obtain PFTs 2D echo to exclude cardiac etiology     3. Non-small cell cancer of right lung (HCC)  C34.91 ECHOCARDIOGRAM COMPLETE   Status post SBRT Lesion reduced in size significantly Continue follow-up with oncology     Orders Placed This Encounter  Procedures   Pulmonary Function Test [facility] Only    Standing Status:   Future    Standing Expiration Date:   12/23/2021    Order Specific Question:   Full PFT: includes the following: basic spirometry, spirometry pre & post bronchodilator, diffusion capacity (DLCO), lung volumes    Answer:   Full PFT    Order Specific Question:   This test can only be performed at  Answer:   [facility]   Pulse oximetry, overnight    Standing Status:   Future    Standing Expiration Date:   12/23/2021   ECHOCARDIOGRAM COMPLETE    Standing Status:   Future    Standing Expiration Date:   06/25/2021    Order Specific Question:   Where should this test be performed    Answer:   [facility]    Order Specific Question:   Perflutren DEFINITY (image enhancing agent) should be administered unless hypersensitivity or allergy exist    Answer:   Administer Perflutren    Order Specific Question:   Reason for exam-Echo    Answer:   Dyspnea  R06.00    Meds ordered this encounter  Medications   Budeson-Glycopyrrol-Formoterol (BREZTRI AEROSPHERE) 160-9-4.8 MCG/ACT AERO    Sig: Inhale 2 puffs into the lungs in the morning and at bedtime.    Dispense:  5.9 g    Refill:  0    Order Specific Question:   Lot Number?    Answer:   [id number]    Order Specific Question:   Expiration Date?    Answer:   06/04/2024     Order Specific Question:   Quantity    Answer:   2   Spacer/Aero-Holding Josiah Lobo DEVI    Sig: USE AS DIRECTED    Dispense:  1 each    Refill:  0   Discussion:  Patient likely has poorly compensated COPD.  He has not noticed any improvement on Breo Ellipta.  We will proceed with giving her a trial of Breztri 2 puffs twice a day.  Will obtain PFTs, 2D echo and overnight oximetry to determine severity of COPD.  We will see her in follow-up in 4 to 6 weeks time with either me or the nurse practitioner.  She is to contact us prior to that time should any new difficulties arise.   Renold Don, MD [facility] [facility]   *This note was dictated using voice recognition software/Dragon.  Despite best efforts to proofread, errors can occur which can change the meaning.  Any change was purely unintentional.

## 2020-12-28 ENCOUNTER — Other Ambulatory Visit
Admission: RE | Admit: 2020-12-28 | Discharge: 2020-12-28 | Disposition: A | Discharge status: Home / Self Care | Payer: Medicare Other | Source: Ambulatory Visit | Attending: Pulmonary Disease | Admitting: Pulmonary Disease

## 2020-12-28 ENCOUNTER — Other Ambulatory Visit: Payer: Self-pay

## 2020-12-28 DIAGNOSIS — Z01812 Encounter for preprocedural laboratory examination: Secondary | ICD-10-CM | POA: Diagnosis not present

## 2020-12-28 DIAGNOSIS — Z20822 Contact with and (suspected) exposure to covid-19: Secondary | ICD-10-CM | POA: Diagnosis not present

## 2020-12-28 LAB — SARS CORONAVIRUS 2 (TAT 6-24 HRS): SARS Coronavirus 2: NEGATIVE

## 2020-12-29 ENCOUNTER — Ambulatory Visit: Payer: Medicare Other | Attending: Pulmonary Disease

## 2020-12-29 DIAGNOSIS — J449 Chronic obstructive pulmonary disease, unspecified: Secondary | ICD-10-CM | POA: Insufficient documentation

## 2020-12-29 MED ORDER — ALBUTEROL SULFATE (2.5 MG/3ML) 0.083% IN NEBU
2.5000 mg | INHALATION_SOLUTION | Freq: Once | RESPIRATORY_TRACT | Status: AC
  Administered 2020-12-29: 2.5 mg via RESPIRATORY_TRACT
  Filled 2020-12-29: qty 3

## 2020-12-30 ENCOUNTER — Encounter: Payer: Self-pay | Admitting: Pulmonary Disease

## 2020-12-30 DIAGNOSIS — R0683 Snoring: Secondary | ICD-10-CM | POA: Diagnosis not present

## 2020-12-30 DIAGNOSIS — G4730 Sleep apnea, unspecified: Secondary | ICD-10-CM | POA: Diagnosis not present

## 2021-01-03 ENCOUNTER — Telehealth: Payer: Self-pay | Admitting: Pulmonary Disease

## 2021-01-03 NOTE — Telephone Encounter (Signed)
ONO reviewed by Dr. Eleonore Chiquito QHS [id number] hypoxia due to COPD.  DME: [facility]  Lm for patient.

## 2021-01-04 ENCOUNTER — Telehealth: Payer: Self-pay | Admitting: Pulmonary Disease

## 2021-01-04 NOTE — Telephone Encounter (Signed)
Lm x2 for patient Will close encounter per office protocol. Letter mailed to address on file.

## 2021-01-04 NOTE — Telephone Encounter (Signed)
I reassure her that is why she has a physician because we have done the research.

## 2021-01-04 NOTE — Telephone Encounter (Signed)
Lm for patient.  

## 2021-01-04 NOTE — Telephone Encounter (Signed)
    3:36 PM Note ONO reviewed by Dr. Eleonore Chiquito QHS [id number] hypoxia due to COPD. DME: [employer]   Lm for patient.        Patient is aware of results and voiced her understanding.  She is not sure if she wants to proceed with oxygen at this time.  She is going to do some research and will call back with a decision.   Routing to Dr. Patsey Berthold as an [name].

## 2021-01-05 NOTE — Telephone Encounter (Signed)
Spoke to patient, who stated that she still has not made a decision regarding oxygen. She will call back with update.

## 2021-01-13 ENCOUNTER — Telehealth: Payer: Self-pay | Admitting: Pulmonary Disease

## 2021-01-13 MED ORDER — BREZTRI AEROSPHERE 160-9-4.8 MCG/ACT IN AERO: 2.0000 | INHALATION_SPRAY | Freq: Two times a day (BID) | RESPIRATORY_TRACT | 6 refills | Status: DC

## 2021-01-13 NOTE — Telephone Encounter (Signed)
Called and spoke with patient who states that she got samples of Breztri and would like RX sent to pharmacy. She verified preferred pharmacy. RX has been sent. Advised her that if it is to expensive, not covered or if there is any issues to call and let us know and will send patient assistance paperwork, do PA or anything that needed to be done. She expressed understanding. Nothing further needed at this time.

## 2021-01-17 ENCOUNTER — Ambulatory Visit
Admission: RE | Admit: 2021-01-17 | Discharge: 2021-01-17 | Disposition: A | Discharge status: Home / Self Care | Payer: Medicare Other | Source: Ambulatory Visit | Attending: Oncology | Admitting: Oncology

## 2021-01-17 ENCOUNTER — Other Ambulatory Visit: Payer: Self-pay

## 2021-01-17 DIAGNOSIS — C3490 Malignant neoplasm of unspecified part of unspecified bronchus or lung: Secondary | ICD-10-CM | POA: Diagnosis not present

## 2021-01-17 DIAGNOSIS — J439 Emphysema, unspecified: Secondary | ICD-10-CM | POA: Diagnosis not present

## 2021-01-17 DIAGNOSIS — R911 Solitary pulmonary nodule: Secondary | ICD-10-CM | POA: Diagnosis not present

## 2021-01-17 DIAGNOSIS — R918 Other nonspecific abnormal finding of lung field: Secondary | ICD-10-CM | POA: Diagnosis not present

## 2021-01-17 DIAGNOSIS — C3431 Malignant neoplasm of lower lobe, right bronchus or lung: Secondary | ICD-10-CM | POA: Diagnosis not present

## 2021-01-17 DIAGNOSIS — I700 Atherosclerosis of aorta: Secondary | ICD-10-CM | POA: Diagnosis not present

## 2021-01-21 ENCOUNTER — Other Ambulatory Visit: Payer: Self-pay | Admitting: Family Medicine

## 2021-01-21 ENCOUNTER — Inpatient Hospital Stay: Payer: Medicare Other | Attending: Oncology

## 2021-01-21 ENCOUNTER — Inpatient Hospital Stay (HOSPITAL_BASED_OUTPATIENT_CLINIC_OR_DEPARTMENT_OTHER): Payer: Medicare Other | Admitting: Oncology

## 2021-01-21 ENCOUNTER — Encounter: Payer: Self-pay | Admitting: Oncology

## 2021-01-21 VITALS — BP 112/76 | HR 102 | Temp 98.1°F | Resp 16 | Ht 67.0 in | Wt 144.8 lb

## 2021-01-21 DIAGNOSIS — Z85118 Personal history of other malignant neoplasm of bronchus and lung: Secondary | ICD-10-CM

## 2021-01-21 DIAGNOSIS — Z08 Encounter for follow-up examination after completed treatment for malignant neoplasm: Secondary | ICD-10-CM | POA: Diagnosis not present

## 2021-01-21 DIAGNOSIS — Z20822 Contact with and (suspected) exposure to covid-19: Secondary | ICD-10-CM | POA: Diagnosis not present

## 2021-01-21 DIAGNOSIS — N184 Chronic kidney disease, stage 4 (severe): Secondary | ICD-10-CM

## 2021-01-21 DIAGNOSIS — C3431 Malignant neoplasm of lower lobe, right bronchus or lung: Secondary | ICD-10-CM

## 2021-01-21 LAB — CBC WITH DIFFERENTIAL/PLATELET
Abs Immature Granulocytes: 0.03 10*3/uL (ref 0.00–0.07)
Basophils Absolute: 0 10*3/uL (ref 0.0–0.1)
Basophils Relative: 1 %
Eosinophils Absolute: 0.2 10*3/uL (ref 0.0–0.5)
Eosinophils Relative: 3 %
HCT: 37.1 % (ref 36.0–46.0)
Hemoglobin: 12.2 g/dL (ref 12.0–15.0)
Immature Granulocytes: 1 %
Lymphocytes Relative: 12 %
Lymphs Abs: 0.7 10*3/uL (ref 0.7–4.0)
MCH: 33.9 pg (ref 26.0–34.0)
MCHC: 32.9 g/dL (ref 30.0–36.0)
MCV: 103.1 fL — ABNORMAL HIGH (ref 80.0–100.0)
Monocytes Absolute: 0.3 10*3/uL (ref 0.1–1.0)
Monocytes Relative: 5 %
Neutro Abs: 4.7 10*3/uL (ref 1.7–7.7)
Neutrophils Relative %: 78 %
Platelets: 189 10*3/uL (ref 150–400)
RBC: 3.6 MIL/uL — ABNORMAL LOW (ref 3.87–5.11)
RDW: 14.2 % (ref 11.5–15.5)
WBC: 5.9 10*3/uL (ref 4.0–10.5)
nRBC: 0 % (ref 0.0–0.2)

## 2021-01-21 LAB — COMPREHENSIVE METABOLIC PANEL
ALT: 14 U/L (ref 0–44)
AST: 20 U/L (ref 15–41)
Albumin: 3.8 g/dL (ref 3.5–5.0)
Alkaline Phosphatase: 84 U/L (ref 38–126)
Anion gap: 9 (ref 5–15)
BUN: 29 mg/dL — ABNORMAL HIGH (ref 8–23)
CO2: 25 mmol/L (ref 22–32)
Calcium: 9.2 mg/dL (ref 8.9–10.3)
Chloride: 104 mmol/L (ref 98–111)
Creatinine, Ser: 1.77 mg/dL — ABNORMAL HIGH (ref 0.44–1.00)
GFR, Estimated: 29 mL/min — ABNORMAL LOW (ref >60)
Glucose, Bld: 157 mg/dL — ABNORMAL HIGH (ref 70–99)
Potassium: 4.1 mmol/L (ref 3.5–5.1)
Sodium: 138 mmol/L (ref 135–145)
Total Bilirubin: 0.8 mg/dL (ref 0.3–1.2)
Total Protein: 7.2 g/dL (ref 6.5–8.1)

## 2021-01-21 NOTE — Progress Notes (Signed)
Recent labs and note from Dr. Janese Banks received Lab Results  Component Value Date   CREATININE 1.77 (H) 01/21/2021   Lab Results  Component Value Date   BUN 29 (H) 01/21/2021   Lab Results  Component Value Date   GFRNONAA 29 (L) 01/21/2021   Pt was previously seeing nephrology and she had been urged to f/up with nephrology over the past two years - last OV was 08/2019 with Dr. Holley Raring, prior to that she saw Dr. Candiss Norse   Will put in referral again to help pt get f/up with slight decrease in GFR, in the past 2 years GFR has ranged 29 - 40, sCr 1.25 -1.77  Delsa Grana, PA-C

## 2021-01-21 NOTE — Progress Notes (Signed)
Pt states that she gets sob on exertion, wheezing and cough on exertion or if she is sitting at times

## 2021-01-23 NOTE — Progress Notes (Signed)
I connected with Christine Burnett on 01/23/21 at  2:30 PM EDT by video enabled telemedicine visit and verified that I am speaking with the correct person using two identifiers.   I discussed the limitations, risks, security and privacy concerns of performing an evaluation and management service by telemedicine and the availability of in-person appointments. I also discussed with the patient that there may be a patient responsible charge related to this service. The patient expressed understanding and agreed to proceed.  Other persons participating in the visit and their role in the encounter:  none  Patient's location:  cancer center Provider's location:  home  Chief Complaint:  routine f/u of lung cancer  History of present illness: Patient is a 80 year old female with a past medical history significant for hyperlipidemia.  She was admitted to the hospital in October 2021 for Covid and was found to have a masslike opacity in the right infrahilar lung.  At that point a repeat chest x-ray in 6 weeks was recommended.  X-ray in November 2021 showed persistent round opacity in the right lower lobe which was followed up with a CT chest without contrast.  It showed a 4 x 3.5 cm lobulated and spiculated mass in the right lower lobe surrounding the right lower lobe bronchial tree.  Associated soft tissue within the bronchial tree which may represent local ingrowth.  This was followed by a bronchoscopy and bronchoalveolar lavage from the right lower lobe was consistent with non-small cell lung cancer.  Neoplastic cells were negative for p40 TTF-1 and CD56.    Further characterization between squamous and adenocarcinoma could not be obtained.   PET CT scan showed a 4.2 cm right lower lobe lung mass with an SUV of 10.  No hypermetabolic hilar or mediastinal adenopathy.  No evidence of distant metastatic disease.  Tiny nodule at the minor fissure of the right chest potential on follow-up.  Mild aneurysmal abdominal  aorta recommend follow-up every 3 years.  Patient received SBRT to the lesion sometime in March 2022.  Interval history: Patient reports exertional shortness of breath and wheezing.  She had seen Dr. Royston Bake and is currently on breztri inhaler but states that it is not helping her.   Review of Systems  Constitutional:  Negative for chills, fever, malaise/fatigue and weight loss.  HENT:  Negative for congestion, ear discharge and nosebleeds.   Eyes:  Negative for blurred vision.  Respiratory:  Positive for cough, shortness of breath and wheezing. Negative for hemoptysis and sputum production.   Cardiovascular:  Negative for chest pain, palpitations, orthopnea and claudication.  Gastrointestinal:  Negative for abdominal pain, blood in stool, constipation, diarrhea, heartburn, melena, nausea and vomiting.  Genitourinary:  Negative for dysuria, flank pain, frequency, hematuria and urgency.  Musculoskeletal:  Negative for back pain, joint pain and myalgias.  Skin:  Negative for rash.  Neurological:  Negative for dizziness, tingling, focal weakness, seizures, weakness and headaches.  Endo/Heme/Allergies:  Does not bruise/bleed easily.  Psychiatric/Behavioral:  Negative for depression and suicidal ideas. The patient does not have insomnia.    Allergies  Allergen Reactions   Cefuroxime Axetil     caused rash.   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   Managed by nephrologist   Non-small cell lung cancer (NSCLC) ([place])    Osteoporosis    Risk for falls    Situational disturbance    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   TIA (transient ischemic attack)     Past Surgical History:  Procedure Laterality Date   COLONOSCOPY  2016   ESOPHAGUS SURGERY     INTRAMEDULLARY (IM) NAIL INTERTROCHANTERIC Left 03/26/2017   Procedure: INTRAMEDULLARY (IM) NAIL INTERTROCHANTRIC;  Surgeon: Hessie Knows, MD;  Location: [facility] ORS;  Service: Orthopedics;  Laterality: Left;   VIDEO BRONCHOSCOPY WITH ENDOBRONCHIAL NAVIGATION Right 06/28/2020   Procedure: VIDEO BRONCHOSCOPY WITH ENDOBRONCHIAL NAVIGATION CELLVIZIO;  Surgeon: Tyler Pita, MD;  Location: [facility] ORS;  Service: Pulmonary;  Laterality: Right;    Social History   Socioeconomic History   Marital status: Divorced    Spouse name: Not on file   Number of children: 4   Years of education: Not on file   Highest education level: 12th grade  Occupational History   Occupation: Retired    Comment: payroll  Tobacco Use   Smoking status: Former    Packs/day: 1.00    Years: 61.00    Pack years: 61.00    Types: Cigarettes    Quit date: 03/25/2017    Years since quitting: 3.8   Smokeless tobacco: Never  Vaping Use   Vaping Use: Never used  Substance and Sexual Activity   Alcohol use: No   Drug use: No   Sexual activity: Not Currently  Other Topics Concern   Not on file  Social History Narrative   Full Code   4 children   Denies alcohol use   Patient has 2 daughters and 1 grandson living with her   Social Determinants of Radio broadcast assistant Strain: Low Risk    Difficulty of Paying Living Expenses: Not hard at all  Food Insecurity: No Food Insecurity   Worried About Charity fundraiser in the Last Year: Never true   Arboriculturist in the Last Year: Never true  Transportation Needs: No Transportation Needs   Lack of Transportation (Medical): No   Lack of  Transportation (Non-Medical): No  Physical Activity: Inactive   Days of Exercise per Week: 0 days   Minutes of Exercise per Session: 0 min  Stress: Stress Concern Present   Feeling of Stress : Rather much  Social Connections: Moderately Isolated   Frequency of Communication with Friends and Family: More than three times a week   Frequency of Social Gatherings with Friends and Family: More than three times a week   Attends Religious Services: More than 4 times per year   Active Member of [employer] or Organizations: No   Attends Archivist Meetings: Never   Marital Status: Divorced  Human resources officer Violence: Not At Risk   Fear of Current or Ex-Partner: No   Emotionally Abused: No   Physically Abused: No   Sexually Abused: No    Family History  Problem Relation Age of Onset   Other Mother        [place] posioning  Heart attack Father    Prostate cancer Father    Heart attack Brother    Diabetes Brother    Hypertension Daughter    Kidney cancer Neg Hx    Bladder Cancer Neg Hx    Colon cancer Neg Hx    Colon polyps Neg Hx    Rectal cancer Neg Hx      Current Outpatient Medications:    acetaminophen (TYLENOL) 650 MG CR tablet, Take 1 tablet (650 mg total) by mouth 4 (four) times daily as needed for pain., Disp: , Rfl:    albuterol (VENTOLIN HFA) 108 (90 Base) MCG/ACT inhaler, Inhale 2 puffs into the lungs every 6 (six) hours as needed for wheezing or shortness of breath., Disp: 1 each, Rfl: 0   aspirin 81 MG EC tablet, Take 81 mg by mouth daily. Swallow whole., Disp: , Rfl:    atorvastatin (LIPITOR) 10 MG tablet, Take 1 tablet (10 mg total) by mouth at bedtime., Disp: 90 tablet, Rfl: 3   Budeson-Glycopyrrol-Formoterol (BREZTRI AEROSPHERE) 160-9-4.8 MCG/ACT AERO, Inhale 2 puffs into the lungs in the morning and at bedtime., Disp: 10.7 g, Rfl: 6   Calcium 200 MG TABS, Take 1 tablet by mouth daily., Disp: , Rfl:    Cholecalciferol (VITAMIN D-3) 1000 units CAPS, Take 2,000  Units by mouth daily. Takes occassionally, Disp: , Rfl:    melatonin 3 MG TABS tablet, Take 3 mg by mouth at bedtime., Disp: , Rfl:    Probiotic Product (PROBIOTIC-10 PO), Take 1 capsule by mouth daily., Disp: , Rfl:    Spacer/Aero-Holding Chambers DEVI, USE AS DIRECTED, Disp: 1 each, Rfl: 0  CT Chest Wo Contrast  Result Date: 01/17/2021 CLINICAL DATA:  Restaging non-small cell lung cancer. EXAM: CT CHEST WITHOUT CONTRAST TECHNIQUE: Multidetector CT imaging of the chest was performed following the standard protocol without IV contrast. COMPARISON:  09/16/2020 FINDINGS: Cardiovascular: Heart size normal. Aortic atherosclerosis. Coronary artery atherosclerotic calcifications. No pericardial effusion. Mediastinum/Nodes: No enlarged mediastinal or axillary lymph nodes. Thyroid gland, trachea, and esophagus demonstrate no significant findings. Lungs/Pleura: Paraseptal and centrilobular emphysema. No pleural effusion identified. Right lower lobe lesion measures 2.1 x 1.7 cm, image 102/3. Formally 2.9 by 2.3 cm. Adjacent architectural distortion and pleural thickening is identified which is favored to represent post treatment changes. Peripheral nodule in the right middle lobe measures 3 mm and appears unchanged from previous exam, image 87/3. Subpleural nodule in the left lower lobe is unchanged measuring 4 mm, image 116/3. Upper Abdomen: No acute abnormality. Musculoskeletal: No chest wall mass or suspicious bone lesions identified. Similar appearance of superior endplate compression fracture involving the L1 vertebral body. IMPRESSION: 1. Interval decrease in size of right lower lobe lung lesion with surrounding post treatment changes. 2. Small nonspecific pulmonary nodules are unchanged from previous exam. 3. Emphysema and aortic atherosclerosis. 4. Coronary artery calcifications. Aortic Atherosclerosis (ICD10-I70.0) and Emphysema (ICD10-J43.9). Electronically Signed   By: Kerby Moors M.D.   On: 01/17/2021  12:20   Pulmonary Function Test [facility] Only  Result Date: 12/30/2020 Spirometry Data Is Acceptable and Reproducible Mild Obstructive Airways Disease without  Significant Broncho-Dilator Response +hyperinflation Consider outpatient Pulmonary Consultation if needed Clinical Correlation Advised    No images are attached to the encounter.   CMP Latest Ref Rng & Units 01/21/2021  Glucose 70 - 99 mg/dL 157(H)  BUN 8 - 23 mg/dL 29(H)  Creatinine 0.44 - 1.00 mg/dL 1.77(H)  Sodium 135 - 145 mmol/L 138  Potassium 3.5 - 5.1 mmol/L 4.1  Chloride 98 - 111 mmol/L 104  CO2 22 - 32 mmol/L 25  Calcium 8.9 - 10.3 mg/dL 9.2  Total Protein 6.5 - 8.1 g/dL 7.2  Total Bilirubin 0.3 - 1.2 mg/dL 0.8  Alkaline Phos 38 - 126 U/L 84  AST 15 - 41 U/L 20  ALT 0 - 44 U/L 14   CBC Latest Ref Rng & Units 01/21/2021  WBC 4.0 - 10.5 K/uL 5.9  Hemoglobin 12.0 - 15.0 g/dL 12.2  Hematocrit 36.0 - 46.0 % 37.1  Platelets 150 - 400 K/uL 189     Observation/objective: Appears in no acute distress over video visit today.  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  Small nonspecific pulmonary nodules unchanged as compared to prior visit.  No evidence of pneumonia.  Overall her CT is reassuring at this time and I would like to see her back in 6 months with a repeat CT prior.I have asked her to get in touch with Dr. Patsey Berthold for her symptoms of ongoing wheezing to see if her medications need to be adjusted in any way.  She is agreeable  Follow-up instructions: As above  I discussed the assessment and treatment plan with the patient. The patient was provided an opportunity to ask questions and all were answered. The patient agreed  with the plan and demonstrated an understanding of the instructions.   The patient was advised to call back or seek an in-person evaluation if the symptoms worsen or if the condition fails to improve as anticipated.  Visit Diagnosis: 1. Encounter for follow-up surveillance of lung cancer     Dr. Randa Evens, MD, MPH [facility] at [facility] Tel- [phone] 01/23/2021 9:43 PM

## 2021-01-25 ENCOUNTER — Ambulatory Visit (INDEPENDENT_AMBULATORY_CARE_PROVIDER_SITE_OTHER): Payer: Medicare Other

## 2021-01-25 ENCOUNTER — Other Ambulatory Visit: Payer: Self-pay

## 2021-01-25 DIAGNOSIS — J449 Chronic obstructive pulmonary disease, unspecified: Secondary | ICD-10-CM

## 2021-01-25 DIAGNOSIS — C3491 Malignant neoplasm of unspecified part of right bronchus or lung: Secondary | ICD-10-CM

## 2021-01-25 DIAGNOSIS — R0602 Shortness of breath: Secondary | ICD-10-CM | POA: Diagnosis not present

## 2021-01-26 LAB — ECHOCARDIOGRAM COMPLETE
AR max vel: 1.45 cm2
AV Area VTI: 1.61 cm2
AV Area mean vel: 1.34 cm2
AV Mean grad: 9.5 mmHg
AV Peak grad: 14.3 mmHg
Ao pk vel: 1.89 m/s
Area-P 1/2: 8.62 cm2
MV VTI: 2.63 cm2
S' Lateral: 3.1 cm

## 2021-01-28 ENCOUNTER — Ambulatory Visit (INDEPENDENT_AMBULATORY_CARE_PROVIDER_SITE_OTHER): Payer: Medicare Other | Admitting: Primary Care

## 2021-01-28 ENCOUNTER — Other Ambulatory Visit: Payer: Self-pay

## 2021-01-28 ENCOUNTER — Encounter: Payer: Self-pay | Admitting: Primary Care

## 2021-01-28 VITALS — BP 122/78 | HR 109 | Temp 97.6°F | Ht 67.5 in | Wt 143.6 lb

## 2021-01-28 DIAGNOSIS — I2584 Coronary atherosclerosis due to calcified coronary lesion: Secondary | ICD-10-CM | POA: Diagnosis not present

## 2021-01-28 DIAGNOSIS — C3431 Malignant neoplasm of lower lobe, right bronchus or lung: Secondary | ICD-10-CM | POA: Diagnosis not present

## 2021-01-28 DIAGNOSIS — J449 Chronic obstructive pulmonary disease, unspecified: Secondary | ICD-10-CM

## 2021-01-28 DIAGNOSIS — G4734 Idiopathic sleep related nonobstructive alveolar hypoventilation: Secondary | ICD-10-CM | POA: Diagnosis not present

## 2021-01-28 DIAGNOSIS — I2510 Atherosclerotic heart disease of native coronary artery without angina pectoris: Secondary | ICD-10-CM | POA: Diagnosis not present

## 2021-01-28 NOTE — Progress Notes (Signed)
@Patient  ID: Christine Burnett, female    DOB: Apr 10, 1941, 80 y.o.   MRN: [id number]  No chief complaint on file.   Referring provider: Laurell Roof  HPI: 80 year old female, former smoker quit in October 2018 (61-pack-year history).  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  Patient of Dr. Patsey Berthold, last seen in office on 12/23/2020.   01/28/2021- Interim hx Patient presents today for a 1 month follow-up.  During her last visit with Dr. Patsey Berthold she was given a sample of Breztri Aerosphere 2 puffs twice daily. She feels breztri helped her breathing and the effects appears to last longer. She states that she is able to breath deeper. She still has some intermittent wheezing. She gets very tired throughout the day when doing physical activities, she often needs to sit and rest. She has not needed to use her Albuterol rescue inhaler. She had an overnight oximetry test that showed she spent 6 hours with oxygen level <88%. These results were reviewed by Dr. Patsey Berthold who recommended she wear 2L oxygen at night time but she wanted to consider it.   Cardiac testing: 01/25/21 Echocardiogram - EF 25-36%, Grade 1 diastolic dysfunction, elevated right atrial pressure, normal PA systolic pressure   Imaging: 01/17/21 CT chest- Interval decreased in size of right lower lobe lung lesion with surrounding post treatment changes. Small nonspecific pulmonary nodules are unchanged from previous exam, emphysema and aortic atherosclerosis, coronary artery calcifications  Allergies  Allergen Reactions   Cefuroxime Axetil     caused rash.   Ciprofloxacin Nausea Only    Unknown   Codeine Other (See Comments)    Patient does not like the way this makes her feel     Immunization History  Administered Date(s) Administered   Fluad Quad(high Dose 65+) 06/27/2019   Influenza Split 03/15/2010, 03/21/2011   Influenza, High Dose Seasonal PF 04/07/2015, 03/22/2016, 03/13/2017, 02/25/2018   Influenza,inj,Quad PF,6+ Mos 03/19/2013, 03/10/2014   Pneumococcal Conjugate-13 11/08/2017   Pneumococcal Polysaccharide-23 01/21/2019   Tdap 05/29/2017    Past Medical History:  Diagnosis Date   Allergic rhinitis    Anxiety    Chronic bronchitis (HCC)    Chronic kidney disease, stage III (moderate) ([place]) 08/31/2017   Seeing nephrologist   CKD (chronic kidney disease), stage III (HCC)    COPD (chronic obstructive pulmonary disease) ([place])    Cornea scar    Depression    unspecified   Diverticulosis    Hx of fracture of left hip 12/24/2017   Hyperlipidemia    Hyperparathyroidism, secondary renal ([place]) 08/31/2017   Managed by nephrologist   Non-small cell lung cancer (NSCLC) ([place])    Osteoporosis    Risk for falls    Situational disturbance    Stroke ([place]) 2011   tia's x 2   TIA (transient ischemic attack)     Tobacco History: Social History   Tobacco Use  Smoking Status Former   Packs/day: 1.00   Years: 61.00   Pack years: 61.00   Types: Cigarettes   Quit date: 03/25/2017   Years since quitting: 3.8  Smokeless Tobacco Never   Counseling given: Not Answered   Outpatient Medications Prior to Visit  Medication Sig Dispense Refill   acetaminophen (TYLENOL) 650 MG CR tablet Take 1 tablet (650 mg total) by mouth 4 (four) times daily as needed for pain.  albuterol (VENTOLIN HFA) 108 (90 Base) MCG/ACT inhaler Inhale 2 puffs into the lungs every 6 (six) hours as needed for wheezing or shortness of breath. 1 each 0   aspirin 81 MG EC tablet Take 81 mg by mouth daily. Swallow whole.     atorvastatin (LIPITOR) 10 MG tablet Take 1 tablet (10 mg total) by mouth at bedtime. 90 tablet 3   Budeson-Glycopyrrol-Formoterol (BREZTRI AEROSPHERE) 160-9-4.8  MCG/ACT AERO Inhale 2 puffs into the lungs in the morning and at bedtime. 10.7 g 6   Calcium 200 MG TABS Take 1 tablet by mouth daily.     Cholecalciferol (VITAMIN D-3) 1000 units CAPS Take 2,000 Units by mouth daily. Takes occassionally     melatonin 3 MG TABS tablet Take 3 mg by mouth at bedtime.     Probiotic Product (PROBIOTIC-10 PO) Take 1 capsule by mouth daily.     Spacer/Aero-Holding Chambers DEVI USE AS DIRECTED 1 each 0   No facility-administered medications prior to visit.   Review of Systems  Review of Systems  Constitutional:  Positive for fatigue.  Respiratory:  Negative for chest tightness.        DOE, intermittent wheezing and dry cough  Cardiovascular: Negative.   Psychiatric/Behavioral: Negative.      Physical Exam  BP 122/78 (BP Location: Left Arm, Patient Position: Sitting, Cuff Size: Normal)   Pulse (!) 109   Temp 97.6 F (36.4 C) (Oral)   Ht 5' 7.5" (1.715 m)   Wt 143 lb 9.6 oz (65.1 kg)   SpO2 93%   BMI 22.16 kg/m  Physical Exam Constitutional:      Appearance: Normal appearance.  HENT:     Head: Normocephalic and atraumatic.     Mouth/Throat:     Mouth: Mucous membranes are moist.     Pharynx: Oropharynx is clear.  Cardiovascular:     Rate and Rhythm: Normal rate and regular rhythm.     Comments: No edema Pulmonary:     Effort: Pulmonary effort is normal.     Breath sounds: Normal breath sounds. No wheezing, rhonchi or rales.     Comments: CTA Skin:    General: Skin is warm and dry.  Neurological:     General: No focal deficit present.     Mental Status: She is alert and oriented to person, place, and time. Mental status is at baseline.     Lab Results:  CBC    Component Value Date/Time   WBC 5.9 01/21/2021 1401   RBC 3.60 (L) 01/21/2021 1401   HGB 12.2 01/21/2021 1401   HGB 13.7 01/15/2015 1500   HCT 37.1 01/21/2021 1401   HCT 38.9 01/15/2015 1500   PLT 189 01/21/2021 1401   PLT 278 01/15/2015 1500   MCV 103.1 (H) 01/21/2021  1401   MCV 99 (H) 01/15/2015 1500   MCV 100 07/03/2014 0526   MCH 33.9 01/21/2021 1401   MCHC 32.9 01/21/2021 1401   RDW 14.2 01/21/2021 1401   RDW 13.8 01/15/2015 1500   RDW 13.5 07/03/2014 0526   LYMPHSABS 0.7 01/21/2021 1401   LYMPHSABS 2.9 01/15/2015 1500   LYMPHSABS 2.8 07/03/2014 0526   MONOABS 0.3 01/21/2021 1401   MONOABS 0.5 07/03/2014 0526   EOSABS 0.2 01/21/2021 1401   EOSABS 0.3 01/15/2015 1500   EOSABS 0.3 07/03/2014 0526   BASOSABS 0.0 01/21/2021 1401   BASOSABS 0.0 01/15/2015 1500   BASOSABS 0.1 07/03/2014 0526    BMET    Component Value Date/Time  NA 138 01/21/2021 1401   NA 142 08/12/2015 1155   NA 141 07/03/2014 0526   K 4.1 01/21/2021 1401   K 3.9 07/03/2014 0526   CL 104 01/21/2021 1401   CL 109 (H) 07/03/2014 0526   CO2 25 01/21/2021 1401   CO2 25 07/03/2014 0526   GLUCOSE 157 (H) 01/21/2021 1401   GLUCOSE 90 07/03/2014 0526   BUN 29 (H) 01/21/2021 1401   BUN 17 08/12/2015 1155   BUN 15 07/03/2014 0526   CREATININE 1.77 (H) 01/21/2021 1401   CREATININE 1.54 (H) 05/03/2020 1359   CALCIUM 9.2 01/21/2021 1401   CALCIUM 9.3 07/03/2014 0526   GFRNONAA 29 (L) 01/21/2021 1401   GFRNONAA 32 (L) 05/03/2020 1359   GFRAA 37 (L) 05/03/2020 1359    BNP    Component Value Date/Time   BNP 261 (H) 07/02/2014 1735    ProBNP No results found for: PROBNP  Imaging: CT Chest Wo Contrast  Result Date: 01/17/2021 CLINICAL DATA:  Restaging non-small cell lung cancer. EXAM: CT CHEST WITHOUT CONTRAST TECHNIQUE: Multidetector CT imaging of the chest was performed following the standard protocol without IV contrast. COMPARISON:  09/16/2020 FINDINGS: Cardiovascular: Heart size normal. Aortic atherosclerosis. Coronary artery atherosclerotic calcifications. No pericardial effusion. Mediastinum/Nodes: No enlarged mediastinal or axillary lymph nodes. Thyroid gland, trachea, and esophagus demonstrate no significant findings. Lungs/Pleura: Paraseptal and centrilobular  emphysema. No pleural effusion identified. Right lower lobe lesion measures 2.1 x 1.7 cm, image 102/3. Formally 2.9 by 2.3 cm. Adjacent architectural distortion and pleural thickening is identified which is favored to represent post treatment changes. Peripheral nodule in the right middle lobe measures 3 mm and appears unchanged from previous exam, image 87/3. Subpleural nodule in the left lower lobe is unchanged measuring 4 mm, image 116/3. Upper Abdomen: No acute abnormality. Musculoskeletal: No chest wall mass or suspicious bone lesions identified. Similar appearance of superior endplate compression fracture involving the L1 vertebral body. IMPRESSION: 1. Interval decrease in size of right lower lobe lung lesion with surrounding post treatment changes. 2. Small nonspecific pulmonary nodules are unchanged from previous exam. 3. Emphysema and aortic atherosclerosis. 4. Coronary artery calcifications. Aortic Atherosclerosis (ICD10-I70.0) and Emphysema (ICD10-J43.9). Electronically Signed   By: Kerby Moors M.D.   On: 01/17/2021 12:20   ECHOCARDIOGRAM COMPLETE  Result Date: 01/26/2021    ECHOCARDIOGRAM REPORT   Patient Name:   Christine Burnett Date of Exam: 01/25/2021 Medical Rec #:  [id number]          Height:       67.0 in Accession #:    [id number]         Weight:       144.8 lb Date of Birth:  1941/03/16          BSA:          1.763 m Patient Age:    43 years           BP:           112/76 mmHg Patient Gender: F                  HR:           92 bpm. Exam Location:  [facility] Procedure: 2D Echo, Color Doppler and Cardiac Doppler Indications:    R06.00 Dyspnea  History:        Patient has prior history of Echocardiogram examinations, most  recent 03/27/2017. COPD, Stroke and CKD,                 Signs/Symptoms:Shortness of Breath; Risk Factors:Dyslipidemia.                 Non-small cell cancer of right lung.  Sonographer:    Charmayne Sheer Referring Phys: [id number] CARMEN L GONZALEZ IMPRESSIONS  1.  Left ventricular ejection fraction, by estimation, is 60 to 65%. The left ventricle has normal function. Left ventricular endocardial border not optimally defined to evaluate regional wall motion. Left ventricular diastolic parameters are consistent with Grade I diastolic dysfunction (impaired relaxation). Elevated left atrial pressure.  2. Right ventricular systolic function is normal. The right ventricular size is normal. There is normal pulmonary artery systolic pressure.  3. The mitral valve is grossly normal. Mild to moderate mitral valve regurgitation. No evidence of mitral stenosis.  4. The aortic valve was not well visualized. Aortic valve regurgitation is not visualized. Mild aortic valve stenosis.  5. The inferior vena cava is normal in size with greater than 50% respiratory variability, suggesting right atrial pressure of 3 mmHg. FINDINGS  Left Ventricle: Left ventricular ejection fraction, by estimation, is 60 to 65%. The left ventricle has normal function. Left ventricular endocardial border not optimally defined to evaluate regional wall motion. The left ventricular internal cavity size was normal in size. There is no left ventricular hypertrophy. Left ventricular diastolic parameters are consistent with Grade I diastolic dysfunction (impaired relaxation). Elevated left atrial pressure. Right Ventricle: The right ventricular size is normal. No increase in right ventricular wall thickness. Right ventricular systolic function is normal. There is normal pulmonary artery systolic pressure. The tricuspid regurgitant velocity is 2.55 m/s, and  with an assumed right atrial pressure of 3 mmHg, the estimated right ventricular systolic pressure is 41.3 mmHg. Left Atrium: Left atrial size was normal in size. Right Atrium: Right atrial size was normal in size. Pericardium: Trivial pericardial effusion is present. Presence of pericardial fat pad. Mitral Valve: The mitral valve is grossly normal. Mild to moderate  mitral valve regurgitation. No evidence of mitral valve stenosis. MV peak gradient, 6.0 mmHg. The mean mitral valve gradient is 3.0 mmHg. Tricuspid Valve: The tricuspid valve is not well visualized. Tricuspid valve regurgitation is mild. Aortic Valve: The aortic valve was not well visualized. There is mild aortic valve annular calcification. Aortic valve regurgitation is not visualized. Mild aortic stenosis is present. Aortic valve mean gradient measures 9.5 mmHg. Aortic valve peak gradient measures 14.3 mmHg. Aortic valve area, by VTI measures 1.61 cm. Pulmonic Valve: The pulmonic valve was not well visualized. Pulmonic valve regurgitation is not visualized. No evidence of pulmonic stenosis. Aorta: The aortic root is normal in size and structure. Pulmonary Artery: The pulmonary artery is not well seen. Venous: The inferior vena cava is normal in size with greater than 50% respiratory variability, suggesting right atrial pressure of 3 mmHg. IAS/Shunts: The interatrial septum was not well visualized.  LEFT VENTRICLE PLAX 2D LVIDd:         4.20 cm  Diastology LVIDs:         3.10 cm  LV e' medial:    4.24 cm/s LV PW:         0.80 cm  LV E/e' medial:  16.0 LV IVS:        0.80 cm  LV e' lateral:   5.98 cm/s LVOT diam:     2.00 cm  LV E/e' lateral: 11.3 LV SV:  54 LV SV Index:   30 LVOT Area:     3.14 cm  RIGHT VENTRICLE RV Basal diam:  3.20 cm LEFT ATRIUM             Index       RIGHT ATRIUM          Index LA diam:        2.70 cm 1.53 cm/m  RA Area:     8.86 cm LA Vol (A2C):   22.5 ml 12.76 ml/m RA Volume:   17.60 ml 9.98 ml/m LA Vol (A4C):   25.5 ml 14.46 ml/m LA Biplane Vol: 25.5 ml 14.46 ml/m  AORTIC VALVE                    PULMONIC VALVE AV Area (Vmax):    1.45 cm     PV Vmax:       1.07 m/s AV Area (Vmean):   1.34 cm     PV Vmean:      76.100 cm/s AV Area (VTI):     1.61 cm     PV VTI:        0.190 m AV Vmax:           189.00 cm/s  PV Peak grad:  4.6 mmHg AV Vmean:          136.250 cm/s PV Mean  grad:  3.0 mmHg AV VTI:            0.334 m AV Peak Grad:      14.3 mmHg AV Mean Grad:      9.5 mmHg LVOT Vmax:         87.50 cm/s LVOT Vmean:        58.200 cm/s LVOT VTI:          0.171 m LVOT/AV VTI ratio: 0.51  AORTA Ao Root diam: 3.10 cm MITRAL VALVE                TRICUSPID VALVE MV Area (PHT): 8.62 cm     TR Peak grad:   26.0 mmHg MV Area VTI:   2.63 cm     TR Vmax:        255.00 cm/s MV Peak grad:  6.0 mmHg MV Mean grad:  3.0 mmHg     SHUNTS MV Vmax:       1.22 m/s     Systemic VTI:  0.17 m MV Vmean:      77.8 cm/s    Systemic Diam: 2.00 cm MV Decel Time: 88 msec MV E velocity: 67.70 cm/s MV A velocity: 128.00 cm/s MV E/A ratio:  0.53 Harrell Gave End MD Electronically signed by Nelva Bush MD Signature Date/Time: 01/26/2021/7:51:10 AM    Final      Assessment & Plan:   COPD (chronic obstructive pulmonary disease) ([place]) Patient reports improvement in breathing when changed from [employer] (ICS/LABA) to [employer] (ICS/LABA/LAMA). She still gets out of breathing with activities and is very tired throughout the day. Echocardiogram showed grade 1 DD and CT imaging showed some coronary artery disease. Recommend she follow-up with cardiology for evaluation   Recommendations:  - Continue Breztri Aerosphere two puffs twice daily - Use albuterol rescue inhaler 2 puffs every 6 hours for breakthrough symptoms or PRIOR to an activity that causes shortness of breath/wheezing  Malignant neoplasm of lower lobe of right lung ([place]) - CT chest in August 2022 showed decrease size of RLL lesion with surrounding post treatment changes. Small  nonspecific pulmonary nodules remain unchanged.   Nocturnal hypoxia - ONO 12/30/20 showed patient spent 6 hours with SpO2 <88% RA. Advised she start wearing 2L nocturnal oxygen. She has agreed and we will place DME order.    Martyn Ehrich, NP 01/28/2021

## 2021-01-28 NOTE — Patient Instructions (Addendum)
Testing: 01/25/21 Echocardiogram - EF 68-86%, Grade 1 diastolic dysfunction, elevated right atrial pressure, normal PA systolic pressure   4/84/72 CT chest- Interval decreased in size of right lower lobe lung lesion with surrounding post treatment changes. Small nonspecific pulmonary nodules are unchanged from previous exam, emphysema and aortic atherosclerosis, coronary artery calcifications  ONO showed that you spent 6 hours with Oxygen level < 88%  Recommendations: - Continue Breztri two puffs twice daily (morning and evening- rise mouth after use) - Use albuterol rescue inhaler 2 puffs every 6 hours for breakthrough symptoms or PRIOR to an activity that causes shortness of breath/wheezing  - Start wearing 2L oxygen at night  - Use incentive spirometry during the day to help with deep breathing exercises   Orders: - New nocturnal oxygen start 2L   Referral: - Cardiology re: Dyspnea, Coronary artery calcifications, diastolic dysfunction   Follow-up: - 3 months with Dr. Patsey Berthold or sooner if needed

## 2021-01-28 NOTE — Assessment & Plan Note (Signed)
-   ONO 12/30/20 showed patient spent 6 hours with SpO2 <88% RA. Advised she start wearing 2L nocturnal oxygen. She has agreed and we will place DME order.

## 2021-01-28 NOTE — Assessment & Plan Note (Signed)
-   CT chest in August 2022 showed decrease size of RLL lesion with surrounding post treatment changes. Small nonspecific pulmonary nodules remain unchanged.

## 2021-01-28 NOTE — Assessment & Plan Note (Addendum)
Patient reports improvement in breathing when changed from [facility] (ICS/LABA) to [employer] (ICS/LABA/LAMA). She still gets out of breathing with activities and is very tired throughout the day. Echocardiogram showed grade 1 DD and CT imaging showed some coronary artery disease. Recommend she follow-up with cardiology for evaluation   Recommendations:  - Continue Breztri Aerosphere two puffs twice daily - Use albuterol rescue inhaler 2 puffs every 6 hours for breakthrough symptoms or PRIOR to an activity that causes shortness of breath/wheezing - Use incentive spirometry during the day to help with deep breathing exercises

## 2021-02-02 ENCOUNTER — Ambulatory Visit
Admission: RE | Admit: 2021-02-02 | Discharge: 2021-02-02 | Disposition: A | Discharge status: Home / Self Care | Payer: Medicare Other | Source: Ambulatory Visit | Attending: Radiation Oncology | Admitting: Radiation Oncology

## 2021-02-02 ENCOUNTER — Encounter: Payer: Self-pay | Admitting: Radiation Oncology

## 2021-02-02 VITALS — BP 110/66 | HR 106 | Temp 97.1°F | Resp 24 | Wt 143.4 lb

## 2021-02-02 DIAGNOSIS — Z923 Personal history of irradiation: Secondary | ICD-10-CM | POA: Diagnosis not present

## 2021-02-02 DIAGNOSIS — Z08 Encounter for follow-up examination after completed treatment for malignant neoplasm: Secondary | ICD-10-CM | POA: Diagnosis not present

## 2021-02-02 DIAGNOSIS — R918 Other nonspecific abnormal finding of lung field: Secondary | ICD-10-CM | POA: Diagnosis not present

## 2021-02-02 DIAGNOSIS — C3431 Malignant neoplasm of lower lobe, right bronchus or lung: Secondary | ICD-10-CM | POA: Diagnosis not present

## 2021-02-02 NOTE — Progress Notes (Signed)
Radiation Oncology Follow up Note  Name: Christine Burnett   Date:   02/02/2021 MRN:  [id number] DOB: Mar 27, 1941    This 80 y.o. female presents to the clinic today for 34-month follow-up status post SBRT to her right lower lobe for non-small cell lung cancer.  REFERRING PROVIDER: Delsa Grana, PA-C  HPI: Patient is a 80 year old female now out 5 months having completed SBRT to her right lower lobe for a non-small cell lung cancer.  Seen today in routine follow-up she is doing well.  She specifically [place] cough hemoptysis chest tightness or any change in her pulmonary status..  She had a recent CT scan of her chest showing interval decrease in the size of the right lower lobe lung lesion with surrounding posttreatment changes.  She also has nonspecific pulmonary nodules which are unchanged from previous exam.  COMPLICATIONS OF TREATMENT: none  FOLLOW UP COMPLIANCE: keeps appointments   PHYSICAL EXAM:  BP 110/66   Pulse (!) 106   Temp (!) 97.1 F (36.2 C) (Tympanic)   Resp (!) 24   Wt 143 lb 6.4 oz (65 kg)   SpO2 93%   BMI 22.13 kg/m  Well-developed well-nourished patient in NAD. HEENT reveals PERLA, EOMI, discs not visualized.  Oral cavity is clear. No oral mucosal lesions are identified. Neck is clear without evidence of cervical or supraclavicular adenopathy. Lungs are clear to A&P. Cardiac examination is essentially unremarkable with regular rate and rhythm without murmur rub or thrill. Abdomen is benign with no organomegaly or masses noted. Motor sensory and DTR levels are equal and symmetric in the upper and lower extremities. Cranial nerves II through XII are grossly intact. Proprioception is intact. No peripheral adenopathy or edema is identified. No motor or sensory levels are noted. Crude visual fields are within normal range.  RADIOLOGY RESULTS: CT scan reviewed compatible with above-stated findings  PLAN: Present time patient is doing well excellent response to SBRT  treatment.  She has a very low side effect profile.  I have asked to see her back in 6 months for follow-up with a CT scan at that time if less than has been performed earlier.  Patient knows to call with any concerns.  I would like to take this opportunity to thank you for allowing me to participate in the care of your patient.Noreene Filbert, MD

## 2021-02-04 ENCOUNTER — Telehealth: Payer: Self-pay

## 2021-02-04 NOTE — Telephone Encounter (Signed)
Copied from [place] [id number]. Topic: General - Other >> Feb 04, 2021  3:43 PM Leward Quan A wrote: Reason for CRM: Ms Christine Burnett with [employer] calling to confirm receipt of form faxed on 02/02/21 on behalf of the patient. Please call [phone]

## 2021-02-08 ENCOUNTER — Ambulatory Visit (INDEPENDENT_AMBULATORY_CARE_PROVIDER_SITE_OTHER): Payer: Medicare Other

## 2021-02-08 ENCOUNTER — Other Ambulatory Visit: Payer: Self-pay

## 2021-02-08 VITALS — BP 100/60 | HR 94 | Temp 98.0°F | Resp 16 | Ht 68.0 in | Wt 143.8 lb

## 2021-02-08 DIAGNOSIS — Z0000 Encounter for general adult medical examination without abnormal findings: Secondary | ICD-10-CM

## 2021-02-08 DIAGNOSIS — Z23 Encounter for immunization: Secondary | ICD-10-CM | POA: Diagnosis not present

## 2021-02-08 NOTE — Patient Instructions (Signed)
[name] , Thank you for taking time to come for your Medicare Wellness Visit. I appreciate your ongoing commitment to your health goals. Please review the following plan we discussed and let me know if I can assist you in the future.   Screening recommendations/referrals: Colonoscopy: no longer required Mammogram: no longer required Bone Density: declined Recommended yearly ophthalmology/optometry visit for glaucoma screening and checkup Recommended yearly dental visit for hygiene and checkup  Vaccinations: Influenza vaccine: done today  Pneumococcal vaccine: done 01/21/19 Tdap vaccine: done 05/29/17 Shingles vaccine: Shingrix discussed. Please contact your pharmacy for coverage information.  Covid-19: declined  Advanced directives: Advance directive discussed with you today. I have provided a copy for you to complete at home and have notarized. Once this is complete please bring a copy in to our office so we can scan it into your chart.   Conditions/risks identified: Recommend continuing fall prevention in the home  Next appointment: Follow up in one year for your annual wellness visit    Preventive Care 65 Years and Older, Female Preventive care refers to lifestyle choices and visits with your health care provider that can promote health and wellness. What does preventive care include? A yearly physical exam. This is also called an annual well check. Dental exams once or twice a year. Routine eye exams. Ask your health care provider how often you should have your eyes checked. Personal lifestyle choices, including: Daily care of your teeth and gums. Regular physical activity. Eating a healthy diet. Avoiding tobacco and drug use. Limiting alcohol use. Practicing safe sex. Taking low-dose aspirin every day. Taking vitamin and mineral supplements as recommended by your health care provider. What happens during an annual well check? The services and screenings done by your  health care provider during your annual well check will depend on your age, overall health, lifestyle risk factors, and family history of disease. Counseling  Your health care provider may ask you questions about your: Alcohol use. Tobacco use. Drug use. Emotional well-being. Home and relationship well-being. Sexual activity. Eating habits. History of falls. Memory and ability to understand (cognition). Work and work Statistician. Reproductive health. Screening  You may have the following tests or measurements: Height, weight, and BMI. Blood pressure. Lipid and cholesterol levels. These may be checked every 5 years, or more frequently if you are over 66 years old. Skin check. Lung cancer screening. You may have this screening every year starting at age 43 if you have a 30-pack-year history of smoking and currently smoke or have quit within the past 15 years. Fecal occult blood test (FOBT) of the stool. You may have this test every year starting at age 32. Flexible sigmoidoscopy or colonoscopy. You may have a sigmoidoscopy every 5 years or a colonoscopy every 10 years starting at age 65. Hepatitis C blood test. Hepatitis B blood test. Sexually transmitted disease (STD) testing. Diabetes screening. This is done by checking your blood sugar (glucose) after you have not eaten for a while (fasting). You may have this done every 1-3 years. Bone density scan. This is done to screen for osteoporosis. You may have this done starting at age 24. Mammogram. This may be done every 1-2 years. Talk to your health care provider about how often you should have regular mammograms. Talk with your health care provider about your test results, treatment options, and if necessary, the need for more tests. Vaccines  Your health care provider may recommend certain vaccines, such as: Influenza vaccine. This is recommended every  year. Tetanus, diphtheria, and acellular pertussis (Tdap, Td) vaccine. You may  need a Td booster every 10 years. Zoster vaccine. You may need this after age 31. Pneumococcal 13-valent conjugate (PCV13) vaccine. One dose is recommended after age 11. Pneumococcal polysaccharide (PPSV23) vaccine. One dose is recommended after age 72. Talk to your health care provider about which screenings and vaccines you need and how often you need them. This information is not intended to replace advice given to you by your health care provider. Make sure you discuss any questions you have with your health care provider. Document Released: 06/18/2015 Document Revised: 02/09/2016 Document Reviewed: 03/23/2015 Elsevier Interactive Patient Education  2017 [place] Prevention in the Home Falls can cause injuries. They can happen to people of all ages. There are many things you can do to make your home safe and to help prevent falls. What can I do on the outside of my home? Regularly fix the edges of walkways and driveways and fix any cracks. Remove anything that might make you trip as you walk through a door, such as a raised step or threshold. Trim any bushes or trees on the path to your home. Use bright outdoor lighting. Clear any walking paths of anything that might make someone trip, such as rocks or tools. Regularly check to see if handrails are loose or broken. Make sure that both sides of any steps have handrails. Any raised decks and porches should have guardrails on the edges. Have any leaves, snow, or ice cleared regularly. Use sand or salt on walking paths during winter. Clean up any spills in your garage right away. This includes oil or grease spills. What can I do in the bathroom? Use night lights. Install grab bars by the toilet and in the tub and shower. Do not use towel bars as grab bars. Use non-skid mats or decals in the tub or shower. If you need to sit down in the shower, use a plastic, non-slip stool. Keep the floor dry. Clean up any water that spills on  the floor as soon as it happens. Remove soap buildup in the tub or shower regularly. Attach bath mats securely with double-sided non-slip rug tape. Do not have throw rugs and other things on the floor that can make you trip. What can I do in the bedroom? Use night lights. Make sure that you have a light by your bed that is easy to reach. Do not use any sheets or blankets that are too big for your bed. They should not hang down onto the floor. Have a firm chair that has side arms. You can use this for support while you get dressed. Do not have throw rugs and other things on the floor that can make you trip. What can I do in the kitchen? Clean up any spills right away. Avoid walking on wet floors. Keep items that you use a lot in easy-to-reach places. If you need to reach something above you, use a strong step stool that has a grab bar. Keep electrical cords out of the way. Do not use floor polish or wax that makes floors slippery. If you must use wax, use non-skid floor wax. Do not have throw rugs and other things on the floor that can make you trip. What can I do with my stairs? Do not leave any items on the stairs. Make sure that there are handrails on both sides of the stairs and use them. Fix handrails that are broken or  loose. Make sure that handrails are as long as the stairways. Check any carpeting to make sure that it is firmly attached to the stairs. Fix any carpet that is loose or worn. Avoid having throw rugs at the top or bottom of the stairs. If you do have throw rugs, attach them to the floor with carpet tape. Make sure that you have a light switch at the top of the stairs and the bottom of the stairs. If you do not have them, ask someone to add them for you. What else can I do to help prevent falls? Wear shoes that: Do not have high heels. Have rubber bottoms. Are comfortable and fit you well. Are closed at the toe. Do not wear sandals. If you use a stepladder: Make sure  that it is fully opened. Do not climb a closed stepladder. Make sure that both sides of the stepladder are locked into place. Ask someone to hold it for you, if possible. Clearly mark and make sure that you can see: Any grab bars or handrails. First and last steps. Where the edge of each step is. Use tools that help you move around (mobility aids) if they are needed. These include: Canes. Walkers. Scooters. Crutches. Turn on the lights when you go into a dark area. Replace any light bulbs as soon as they burn out. Set up your furniture so you have a clear path. Avoid moving your furniture around. If any of your floors are uneven, fix them. If there are any pets around you, be aware of where they are. Review your medicines with your doctor. Some medicines can make you feel dizzy. This can increase your chance of falling. Ask your doctor what other things that you can do to help prevent falls. This information is not intended to replace advice given to you by your health care provider. Make sure you discuss any questions you have with your health care provider. Document Released: 03/18/2009 Document Revised: 10/28/2015 Document Reviewed: 06/26/2014 Elsevier Interactive Patient Education  2017 [employer].

## 2021-02-08 NOTE — Progress Notes (Signed)
Subjective:   Christine Burnett is a 80 y.o. female who presents for Medicare Annual (Subsequent) preventive examination.  Review of Systems     Cardiac Risk Factors include: advanced age (>79men, >47 women);dyslipidemia     Objective:    Today's Vitals   02/08/21 1329  BP: 100/60  Pulse: 94  Resp: 16  Temp: 98 F (36.7 C)  TempSrc: Oral  SpO2: 94%  Weight: 143 lb 12.8 oz (65.2 kg)  Height: 5\' 8"  (1.727 m)   Body mass index is 21.86 kg/m.  Advanced Directives 02/08/2021 02/02/2021 01/21/2021 09/06/2020 07/15/2020 07/02/2020 06/28/2020  Does Patient Have a Medical Advance Directive? Yes Yes Yes Yes Yes Yes -  Type of Paramedic of [place];Living will [place];Living will Living will Out of facility DNR (pink MOST or yellow form) Healthcare Power of [place];Living will [place];Living will  Does patient want to make changes to medical advance directive? Yes (MAU/Ambulatory/Procedural Areas - Information given) No - Patient declined - - - No - Patient declined -  Copy of [place] in Chart? No - copy requested No - copy requested - No - copy requested No - copy requested No - copy requested No - copy requested  Would patient like information on creating a medical advance directive? - No - Patient declined - No - Patient declined No - Patient declined - -    Current Medications (verified) Outpatient Encounter Medications as of 02/08/2021  Medication Sig   acetaminophen (TYLENOL) 650 MG CR tablet Take 1 tablet (650 mg total) by mouth 4 (four) times daily as needed for pain.   albuterol (VENTOLIN HFA) 108 (90 Base) MCG/ACT inhaler Inhale 2 puffs into the lungs every 6 (six) hours as needed for wheezing or shortness of breath.   aspirin 81 MG EC tablet Take 81 mg by mouth daily. Swallow whole.   atorvastatin (LIPITOR) 10 MG tablet Take 1 tablet (10 mg total) by mouth at bedtime.    Budeson-Glycopyrrol-Formoterol (BREZTRI AEROSPHERE) 160-9-4.8 MCG/ACT AERO Inhale 2 puffs into the lungs in the morning and at bedtime.   Calcium 200 MG TABS Take 1 tablet by mouth daily.   Cholecalciferol (VITAMIN D-3) 1000 units CAPS Take 2,000 Units by mouth daily.   melatonin 3 MG TABS tablet Take 3 mg by mouth at bedtime.   Probiotic Product (PROBIOTIC-10 PO) Take 1 capsule by mouth daily.   Spacer/Aero-Holding Chambers DEVI USE AS DIRECTED   No facility-administered encounter medications on file as of 02/08/2021.    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   Managed by nephrologist   Non-small cell lung cancer (NSCLC) ([place])    Osteoporosis    Oxygen deficiency Me   Risk for falls    Situational disturbance    Stroke ([place]) 2011   tia's x 2  TIA (transient ischemic attack)    Past Surgical History:  Procedure Laterality Date   COLONOSCOPY  2016   ESOPHAGUS SURGERY     INTRAMEDULLARY (IM) NAIL INTERTROCHANTERIC Left 03/26/2017   Procedure: INTRAMEDULLARY (IM) NAIL INTERTROCHANTRIC;  Surgeon: Hessie Knows, MD;  Location: [facility] ORS;  Service: Orthopedics;  Laterality: Left;   VIDEO BRONCHOSCOPY WITH ENDOBRONCHIAL NAVIGATION Right 06/28/2020   Procedure: VIDEO BRONCHOSCOPY WITH ENDOBRONCHIAL NAVIGATION CELLVIZIO;  Surgeon: Tyler Pita, MD;  Location: [facility] ORS;  Service: Pulmonary;  Laterality: Right;   Family History   Problem Relation Age of Onset   Other Mother        [place] posioning   Kidney disease Mother    Heart attack Father    Prostate cancer Father    Cancer Father    Heart disease Father    Heart attack Brother    Diabetes Brother    Hypertension Daughter    Kidney cancer Neg Hx    Bladder Cancer Neg Hx    Colon cancer Neg Hx    Colon polyps Neg Hx    Rectal cancer Neg Hx    Social History   Socioeconomic History   Marital status: Divorced    Spouse name: Not on file   Number of children: 4   Years of education: Not on file   Highest education level: 12th grade  Occupational History   Occupation: Retired    Comment: payroll  Tobacco Use   Smoking status: Former    Packs/day: 1.00    Years: 61.00    Pack years: 61.00    Types: Cigarettes    Quit date: 03/25/2017    Years since quitting: 3.8   Smokeless tobacco: Never  Vaping Use   Vaping Use: Never used  Substance and Sexual Activity   Alcohol use: No   Drug use: No   Sexual activity: Not Currently  Other Topics Concern   Not on file  Social History Narrative   Full Code   4 children   Denies alcohol use   Patient has 1 daughters and 1 grandson living with her   Social Determinants of Radio broadcast assistant Strain: Low Risk    Difficulty of Paying Living Expenses: Not hard at all  Food Insecurity: No Food Insecurity   Worried About Charity fundraiser in the Last Year: Never true   Arboriculturist in the Last Year: Never true  Transportation Needs: No Transportation Needs   Lack of Transportation (Medical): No   Lack of Transportation (Non-Medical): No  Physical Activity: Inactive   Days of Exercise per Week: 0 days   Minutes of Exercise per Session: 0 min  Stress: Stress Concern Present   Feeling of Stress : Rather much  Social Connections: Moderately Isolated   Frequency of Communication with Friends and Family: More than three times a week   Frequency of Social Gatherings with Friends and  Family: More than three times a week   Attends Religious Services: More than 4 times per year   Active Member of [employer] or Organizations: No   Attends Music therapist: Never   Marital Status: Divorced    Tobacco Counseling Counseling given: Not Answered   Clinical Intake:  Pre-visit preparation completed: Yes  Pain : No/denies pain     BMI - recorded: 21.86 Nutritional Status: BMI of 19-24  Normal Nutritional Risks: None Diabetes: No  How often do you need to have someone help you when you  read instructions, pamphlets, or other written materials from your doctor or pharmacy?: 1 - Never    Interpreter Needed?: No  Information entered by :: Clemetine Marker LPN   Activities of Daily Living In your present state of health, do you have any difficulty performing the following activities: 02/08/2021 06/28/2020  Hearing? N -  Vision? N -  Difficulty concentrating or making decisions? N -  Comment - -  Walking or climbing stairs? N -  Comment - -  Dressing or bathing? Y -  Doing errands, shopping? N Y  Comment - Landscape architect and eating ? N -  Using the Toilet? N -  In the past six months, have you accidently leaked urine? Y -  Comment urge incontinence; wears a pad at night -  Do you have problems with loss of bowel control? N -  Managing your Medications? N -  Managing your Finances? N -  Housekeeping or managing your Housekeeping? N -  Some recent data might be hidden    Patient Care Team: Delsa Grana, PA-C as PCP - General (Family Medicine) Kate Sable, MD as PCP - Cardiology (Cardiology) Anthonette Legato, MD as Consulting Physician (Nephrology) Telford Nab, RN as Oncology Nurse Navigator Tyler Pita, MD as Consulting Physician (Pulmonary Disease) Sindy Guadeloupe, MD as Consulting Physician (Oncology) Noreene Filbert, MD as Referring Physician (Radiation Oncology)  Indicate any recent Medical Services you may have received from  other than [facility] providers in the past year (date may be approximate).     Assessment:   This is a routine wellness examination for [place].  Hearing/Vision screen Hearing Screening - Comments:: Pt denies hearing difficulty Vision Screening - Comments:: Past due for eye exam; plans to establish care with new provider; declines referral to ophthalmology  Dietary issues and exercise activities discussed: Current Exercise Habits: The patient does not participate in regular exercise at present, Exercise limited by: orthopedic condition(s);respiratory conditions(s)   Goals Addressed             This Visit's Progress    DIET - INCREASE WATER INTAKE   On track    Recommend to drink at least 6-8 8oz glasses of water per day.     Patient Stated       Patient would like to spend time with family and attend her granddaughters weddings next year     Quit smoking / using tobacco   On track    Recommend to quit smoking. Pt to taper down 1 cigarette a day to 0 cigarettes.        Depression Screen PHQ 2/9 Scores 02/08/2021 05/03/2020 05/03/2020 04/02/2020 03/10/2020 01/29/2020 08/29/2019  PHQ - 2 Score 0 0 0 0 4 2 0  PHQ- 9 Score - 1 - - 5 9 0    Fall Risk Fall Risk  02/08/2021 05/03/2020 04/02/2020 03/10/2020 01/29/2020  Falls in the past year? 1 0 0 0 0  Number falls in past yr: 1 0 0 0 0  Injury with Fall? 1 0 0 0 0  Comment - - - - -  Risk Factor Category  - - - - -  Risk for fall due to : History of fall(s);Impaired balance/gait;Impaired vision - - - Impaired balance/gait  Risk for fall due to: Comment - - - - -  Follow up Falls prevention discussed Falls evaluation completed Falls evaluation completed - Falls prevention discussed    FALL RISK PREVENTION PERTAINING TO THE HOME:  Any stairs in or  around the home? Yes  If so, are there any without handrails? No  Home free of loose throw rugs in walkways, pet beds, electrical cords, etc? Yes  Adequate lighting in your home to reduce  risk of falls? Yes   ASSISTIVE DEVICES UTILIZED TO PREVENT FALLS:  Life alert? No  Use of a cane, walker or w/c? Yes  Grab bars in the bathroom? Yes  Shower chair or bench in shower? Yes  Elevated toilet seat or a handicapped toilet? Yes   TIMED UP AND GO:  Was the test performed? Yes .  Length of time to ambulate 10 feet: 7 sec.   Gait slow and steady with assistive device  Cognitive Function:     6CIT Screen 01/29/2020 01/28/2019 01/24/2018 12/25/2016  What Year? 0 points 0 points 0 points 0 points  What month? 0 points 0 points 0 points 0 points  What time? 0 points 0 points 0 points 0 points  Count back from 20 0 points 0 points 0 points 0 points  Months in reverse 2 points 0 points 0 points 0 points  Repeat phrase 0 points 2 points 0 points 6 points  Total Score 2 2 0 6    Immunizations Immunization History  Administered Date(s) Administered   Fluad Quad(high Dose 65+) 06/27/2019, 02/08/2021   Influenza Split 03/15/2010, 03/21/2011   Influenza, High Dose Seasonal PF 04/07/2015, 03/22/2016, 03/13/2017, 02/25/2018   Influenza,inj,Quad PF,6+ Mos 03/19/2013, 03/10/2014   Pneumococcal Conjugate-13 11/08/2017   Pneumococcal Polysaccharide-23 01/21/2019   Tdap 05/29/2017    TDAP status: Up to date  Flu Vaccine status: Completed at today's visit  Pneumococcal vaccine status: Up to date  Covid-19 vaccine status: Declined, Education has been provided regarding the importance of this vaccine but patient still declined. Advised may receive this vaccine at local pharmacy or Health Dept.or vaccine clinic. Aware to provide a copy of the vaccination record if obtained from local pharmacy or Health Dept. Verbalized acceptance and understanding.  Qualifies for Shingles Vaccine? Yes   Zostavax completed No   Shingrix Completed?: No.    Education has been provided regarding the importance of this vaccine. Patient has been advised to call insurance company to determine out of pocket  expense if they have not yet received this vaccine. Advised may also receive vaccine at local pharmacy or Health Dept. Verbalized acceptance and understanding.  Screening Tests Health Maintenance  Topic Date Due   Zoster Vaccines- Shingrix (1 of 2) Never done   DEXA SCAN  10/31/2013   COVID-19 Vaccine (1) 02/13/2021 (Originally 02/24/1946)   TETANUS/TDAP  05/30/2027   INFLUENZA VACCINE  Completed   PNA vac Low Risk Adult  Completed   HPV VACCINES  Aged Out    Health Maintenance  Health Maintenance Due  Topic Date Due   Zoster Vaccines- Shingrix (1 of 2) Never done   DEXA SCAN  10/31/2013    Colorectal cancer screening: No longer required.   Mammogram status: No longer required due to age.  Bone density status: pt declined  Lung Cancer Screening: (Low Dose CT Chest recommended if Age 43-80 years, 30 pack-year currently smoking OR have quit w/in 15years.) does not qualify.   Additional Screening:  Hepatitis C Screening: does not qualify  Vision Screening: Recommended annual ophthalmology exams for early detection of glaucoma and other disorders of the eye. Is the patient up to date with their annual eye exam?  No  Who is the provider or what is the name of the office  in which the patient attends annual eye exams? Not established If pt is not established with a provider, would they like to be referred to a provider to establish care? No .   Dental Screening: Recommended annual dental exams for proper oral hygiene  Community Resource Referral / Chronic Care Management: CRR required this visit?  No   CCM required this visit?  No      Plan:     I have personally reviewed and noted the following in the patient's chart:   Medical and social history Use of alcohol, tobacco or illicit drugs  Current medications and supplements including opioid prescriptions.  Functional ability and status Nutritional status Physical activity Advanced directives List of other  physicians Hospitalizations, surgeries, and ER visits in previous 12 months Vitals Screenings to include cognitive, depression, and falls Referrals and appointments  In addition, I have reviewed and discussed with patient certain preventive protocols, quality metrics, and best practice recommendations. A written personalized care plan for preventive services as well as general preventive health recommendations were provided to patient.     Clemetine Marker, LPN   09/10/5460   Nurse Notes: pt states difficulty breathing due to high humidity. Pt also states she would like to discuss with provider going back on paxil 40 mg due to anxiousness and feeling panicky at times, especially during episodes of feeling short of breath. Patient also reported emotional stress due to strained relationship with her daughter. She had 2 appts earlier this year that were canceled by the provider. Pt plans to schedule follow up appt at check out and advised to contact office if counseling resources needed.

## 2021-02-08 NOTE — Telephone Encounter (Signed)
Contacted Christine Burnett, she refaxed documents again to our office.

## 2021-02-09 ENCOUNTER — Ambulatory Visit (INDEPENDENT_AMBULATORY_CARE_PROVIDER_SITE_OTHER): Payer: Medicare Other | Admitting: Nurse Practitioner

## 2021-02-09 ENCOUNTER — Telehealth: Payer: Self-pay

## 2021-02-09 VITALS — BP 132/76 | HR 106 | Temp 97.6°F | Resp 16 | Ht 68.0 in | Wt 139.7 lb

## 2021-02-09 DIAGNOSIS — F419 Anxiety disorder, unspecified: Secondary | ICD-10-CM

## 2021-02-09 DIAGNOSIS — F330 Major depressive disorder, recurrent, mild: Secondary | ICD-10-CM

## 2021-02-09 DIAGNOSIS — I2510 Atherosclerotic heart disease of native coronary artery without angina pectoris: Secondary | ICD-10-CM

## 2021-02-09 DIAGNOSIS — I2584 Coronary atherosclerosis due to calcified coronary lesion: Secondary | ICD-10-CM

## 2021-02-09 MED ORDER — PAROXETINE HCL 20 MG PO TABS: 20.0000 mg | ORAL_TABLET | Freq: Every day | ORAL | 0 refills | Status: DC

## 2021-02-09 NOTE — Assessment & Plan Note (Signed)
Will start Paxil 20 mg daily first week, then increase dose to 40 mg daily

## 2021-02-09 NOTE — Progress Notes (Signed)
BP 132/76   Pulse (!) 106   Temp 97.6 F (36.4 C)   Resp 16   Ht 5\' 8"  (1.727 m)   Wt 139 lb 11.2 oz (63.4 kg)   SpO2 98%   BMI 21.24 kg/m    Subjective:    Patient ID: Christine Burnett, female    DOB: 1940-09-28, 80 y.o.   MRN: [id number]  HPI: Christine Burnett is a 80 y.o. female  Chief Complaint  Patient presents with   Follow-up    Medication refills   Depression: She says that sometimes she feels down and stressed due to difficult relationship with daughter who lives with her.  She says that her health problems give her increased stress.  No thoughts of hurting herself or anyone else.  PQ9 score 3. Used to take Paxil 40mg  but has been out since around February.  She is wanting to restart. Discussed counseling not interested at this time.   Anxiety:  She says that she gets real anxious about her medical problems.  She says she thinks about not being able to breath and it scares her.  She describes her anxiety as a panic feeling.  It comes and goes. GAD score 7. She said the Paxil used to help with that too.    Depression screen [facility] 2/9 02/09/2021 02/09/2021 02/08/2021 05/03/2020 05/03/2020  Decreased Interest 0 0 0 0 0  Down, Depressed, Hopeless 1 0 0 0 0  PHQ - 2 Score 1 0 0 0 0  Altered sleeping 0 0 - 0 -  Tired, decreased energy 1 0 - 1 -  Change in appetite 0 0 - 0 -  Feeling bad or failure about yourself  1 0 - 0 -  Trouble concentrating 0 0 - 0 -  Moving slowly or fidgety/restless 0 0 - 0 -  Suicidal thoughts 0 0 - 0 -  PHQ-9 Score 3 0 - 1 -  Difficult doing work/chores Somewhat difficult Not difficult at all - Not difficult at all -  Some recent data might be hidden      GAD 7 : Generalized Anxiety Score 02/09/2021  Nervous, Anxious, on Edge 1  Control/stop worrying 1  Worry too much - different things 1  Trouble relaxing 1  Restless 1  Easily annoyed or irritable 1  Afraid - awful might happen 1  Total GAD 7 Score 7  Anxiety Difficulty Somewhat  difficult     Relevant past medical, surgical, family and social history reviewed and updated as indicated. Interim medical history since our last visit reviewed. Allergies and medications reviewed and updated.  Review of Systems  Constitutional: Negative for fever or weight change.  Respiratory: Positive for cough and shortness of breath.   Cardiovascular: Negative for chest pain or palpitations.  Gastrointestinal: Negative for abdominal pain, no bowel changes.  Musculoskeletal: Negative for gait problem or joint swelling.  Skin: Negative for rash. Positive for bruising Neurological: Negative for dizziness or headache.  No other specific complaints in a complete review of systems (except as listed in HPI above).      Objective:    BP 132/76   Pulse (!) 106   Temp 97.6 F (36.4 C)   Resp 16   Ht 5\' 8"  (1.727 m)   Wt 139 lb 11.2 oz (63.4 kg)   SpO2 98%   BMI 21.24 kg/m   Wt Readings from Last 3 Encounters:  02/09/21 139 lb 11.2 oz (63.4 kg)  02/08/21 143 lb  12.8 oz (65.2 kg)  02/02/21 143 lb 6.4 oz (65 kg)    Physical Exam  Constitutional: Patient appears well-developed and well-nourished. No distress.  HEENT: head atraumatic, normocephalic, pupils equal and reactive to light,neck supple Cardiovascular: Normal rate, regular rhythm and normal heart sounds.  No murmur heard. No BLE edema. Pulmonary/Chest: Effort normal and expiratory wheezing noted. No respiratory distress. Abdominal: Soft.  There is no tenderness. Psychiatric: Patient has a normal mood and affect. behavior is normal. Judgment and thought content normal.   Results for orders placed or performed in visit on 01/25/21  ECHOCARDIOGRAM COMPLETE  Result Value Ref Range   AR max vel 1.45 cm2   AV Peak grad 14.3 mmHg   Ao pk vel 1.89 m/s   S' Lateral 3.10 cm   Area-P 1/2 8.62 cm2   AV Area VTI 1.61 cm2   AV Mean grad 9.5 mmHg   AV Area mean vel 1.34 cm2   MV VTI 2.63 cm2      Assessment & Plan:  1.  Mild episode of recurrent major depressive disorder (HCC)  - PARoxetine (PAXIL) 20 MG tablet; Take 1 tablet (20 mg total) by mouth daily. Take one tablet daily for first week, then take two  Dispense: 49 tablet; Refill: 0  2. Anxiety  - PARoxetine (PAXIL) 20 MG tablet; Take 1 tablet (20 mg total) by mouth daily. Take one tablet daily for first week, then take two  Dispense: 49 tablet; Refill: 0   Follow up plan: Return in about 4 weeks (around 03/09/2021) for follow up.

## 2021-02-09 NOTE — Telephone Encounter (Signed)
Pt forgot to inform you on yesterday visit that she is on 2% oxygen

## 2021-02-10 NOTE — Telephone Encounter (Signed)
Ms Christine Burnett with [employer] calling to confirm receipt of form faxed on 02/04/21 on behalf of the patient. And requesting that it is completed  Please call [phone]

## 2021-02-11 NOTE — Telephone Encounter (Signed)
Pt does not know anything about this form denied.Left vm with [employer], fax refusal.

## 2021-02-14 NOTE — Progress Notes (Signed)
Agree with the details of the visit as noted by Elizabeth Walsh, NP.  C. Laura [name], MD [facility] 

## 2021-02-15 DIAGNOSIS — N2581 Secondary hyperparathyroidism of renal origin: Secondary | ICD-10-CM | POA: Diagnosis not present

## 2021-02-15 DIAGNOSIS — I10 Essential (primary) hypertension: Secondary | ICD-10-CM | POA: Diagnosis not present

## 2021-02-15 DIAGNOSIS — N184 Chronic kidney disease, stage 4 (severe): Secondary | ICD-10-CM | POA: Diagnosis not present

## 2021-02-17 ENCOUNTER — Ambulatory Visit (INDEPENDENT_AMBULATORY_CARE_PROVIDER_SITE_OTHER): Payer: Medicare Other | Admitting: Cardiology

## 2021-02-17 ENCOUNTER — Other Ambulatory Visit: Payer: Self-pay

## 2021-02-17 ENCOUNTER — Encounter: Payer: Self-pay | Admitting: Cardiology

## 2021-02-17 VITALS — BP 112/72 | HR 95 | Ht 67.0 in | Wt 143.0 lb

## 2021-02-17 DIAGNOSIS — I2510 Atherosclerotic heart disease of native coronary artery without angina pectoris: Secondary | ICD-10-CM | POA: Diagnosis not present

## 2021-02-17 DIAGNOSIS — E782 Mixed hyperlipidemia: Secondary | ICD-10-CM | POA: Diagnosis not present

## 2021-02-17 DIAGNOSIS — E7800 Pure hypercholesterolemia, unspecified: Secondary | ICD-10-CM | POA: Diagnosis not present

## 2021-02-17 MED ORDER — ATORVASTATIN CALCIUM 20 MG PO TABS: 10.0000 mg | ORAL_TABLET | Freq: Every day | ORAL | 5 refills | Status: DC

## 2021-02-17 NOTE — Progress Notes (Signed)
Cardiology Office Note:    Date:  02/17/2021   ID:  Christine Burnett, DOB [birth year], MRN [id number]  PCP:  Delsa Grana, PA-C   [facility] Providers Cardiologist:  Kate Sable, MD     Referring MD: Martyn Ehrich, NP   Chief Complaint  Patient presents with   Other    Per Geraldo Pitter, Coronary artery calcification. Patient c.o some SOB. Meds reviewed verbally with patient.     History of Present Illness:    Christine Burnett is a 80 y.o. female with a hx of anxiety, former smoker x50 years, COPD, hyperlipidemia, TIA who presents due to coronary artery calcifications.  He obtained noncontrast chest CT for restaging non-small cell lung cancer, coronary artery calcifications were noted.  She denies chest pain.  Has shortness of breath with exertion.  Takes aspirin and Lipitor due to previous history of TIA.  She states not wanting to undergo any invasive cardiac procedures.  She takes aspirin and cholesterol as prescribed.  Prior notes Echo 01/2021 EF 60 to 65%, impaired relaxation. Lexiscan Myoview 03/2019 low risk study, no evidence for ischemia.  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   Managed by nephrologist   Non-small cell lung cancer (NSCLC) ([place])    Osteoporosis    Oxygen deficiency Me   Risk for falls    Situational disturbance    Stroke ([place]) 2011   tia's x 2   TIA (transient ischemic attack)     Past Surgical History:  Procedure Laterality Date   COLONOSCOPY  2016    ESOPHAGUS SURGERY     INTRAMEDULLARY (IM) NAIL INTERTROCHANTERIC Left 03/26/2017   Procedure: INTRAMEDULLARY (IM) NAIL INTERTROCHANTRIC;  Surgeon: Hessie Knows, MD;  Location: [facility] ORS;  Service: Orthopedics;  Laterality: Left;   VIDEO BRONCHOSCOPY WITH ENDOBRONCHIAL NAVIGATION Right 06/28/2020   Procedure: VIDEO BRONCHOSCOPY WITH ENDOBRONCHIAL NAVIGATION CELLVIZIO;  Surgeon: Tyler Pita, MD;  Location: [facility] ORS;  Service: Pulmonary;  Laterality: Right;    Current Medications: Current Meds  Medication Sig   acetaminophen (TYLENOL) 650 MG CR tablet Take 1 tablet (650 mg total) by mouth 4 (four) times daily as needed for pain.   aspirin 81 MG EC tablet Take 81 mg by mouth daily. Swallow whole.   Budeson-Glycopyrrol-Formoterol (BREZTRI AEROSPHERE) 160-9-4.8 MCG/ACT AERO Inhale 2 puffs into the lungs in the morning and at bedtime.   Calcium 200 MG TABS Take 1 tablet by mouth daily.   Cholecalciferol (VITAMIN D-3) 1000 units CAPS Take 2,000 Units by mouth daily.   melatonin 3 MG TABS tablet Take 3 mg by mouth at bedtime.   PARoxetine (PAXIL) 20 MG tablet Take 1 tablet (20 mg total) by mouth daily. Take one tablet daily for first week, then take two   Probiotic Product (PROBIOTIC-10 PO) Take 1 capsule by mouth daily.   Spacer/Aero-Holding Chambers DEVI USE AS DIRECTED   [DISCONTINUED] atorvastatin (LIPITOR) 10 MG tablet Take 1 tablet (10 mg  total) by mouth at bedtime.     Allergies:   Cefuroxime axetil, Ciprofloxacin, and Codeine   Social History   Socioeconomic History   Marital status: Divorced    Spouse name: Not on file   Number of children: 4   Years of education: Not on file   Highest education level: 12th grade  Occupational History   Occupation: Retired    Comment: payroll  Tobacco Use   Smoking status: Former    Packs/day: 1.00    Years: 61.00    Pack years: 61.00    Types: Cigarettes    Quit date: 03/25/2017    Years since quitting: 3.9   Smokeless tobacco:  Never  Vaping Use   Vaping Use: Never used  Substance and Sexual Activity   Alcohol use: No   Drug use: No   Sexual activity: Not Currently  Other Topics Concern   Not on file  Social History Narrative   Full Code   4 children   Denies alcohol use   Patient has 1 daughters and 1 grandson living with her   Social Determinants of Radio broadcast assistant Strain: Low Risk    Difficulty of Paying Living Expenses: Not hard at all  Food Insecurity: No Food Insecurity   Worried About Charity fundraiser in the Last Year: Never true   Arboriculturist in the Last Year: Never true  Transportation Needs: No Transportation Needs   Lack of Transportation (Medical): No   Lack of Transportation (Non-Medical): No  Physical Activity: Inactive   Days of Exercise per Week: 0 days   Minutes of Exercise per Session: 0 min  Stress: Stress Concern Present   Feeling of Stress : Rather much  Social Connections: Moderately Isolated   Frequency of Communication with Friends and Family: More than three times a week   Frequency of Social Gatherings with Friends and Family: More than three times a week   Attends Religious Services: More than 4 times per year   Active Member of [employer] or Organizations: No   Attends Music therapist: Never   Marital Status: Divorced     Family History: The patient's family history includes Cancer in her father; Diabetes in her brother; Heart attack in her brother and father; Heart disease in her father; Hypertension in her daughter; Kidney disease in her mother; Other in her mother; Prostate cancer in her father. There is no history of Kidney cancer, Bladder Cancer, Colon cancer, Colon polyps, or Rectal cancer.  ROS:   Please see the history of present illness.     All other systems reviewed and are negative.  EKGs/Labs/Other Studies Reviewed:    The following studies were reviewed today:   EKG:  EKG is  ordered today.  The ekg ordered today  demonstrates normal sinus rhythm, normal ECG.  Recent Labs: 01/21/2021: ALT 14; BUN 29; Creatinine, Ser 1.77; Hemoglobin 12.2; Platelets 189; Potassium 4.1; Sodium 138  Recent Lipid Panel    Component Value Date/Time   CHOL 137 01/29/2020 1515   CHOL 191 11/10/2015 1211   CHOL 144 07/03/2014 0526   TRIG 111 01/29/2020 1515   TRIG 320 (H) 07/03/2014 0526   HDL 45 (L) 01/29/2020 1515   HDL 46 11/10/2015 1211   HDL 33 (L) 07/03/2014 0526   CHOLHDL 3.0 01/29/2020 1515   VLDL 37 (H) 11/10/2016 1531   VLDL 64 (H) 07/03/2014 0526   LDLCALC 73 01/29/2020 1515   LDLCALC 47 07/03/2014  0526     Risk Assessment/Calculations:          Physical Exam:    VS:  BP 112/72 (BP Location: Right Arm, Patient Position: Sitting, Cuff Size: Normal)   Pulse 95   Ht 5\' 7"  (1.702 m)   Wt 143 lb (64.9 kg)   SpO2 97%   BMI 22.40 kg/m     Wt Readings from Last 3 Encounters:  02/17/21 143 lb (64.9 kg)  02/09/21 139 lb 11.2 oz (63.4 kg)  02/08/21 143 lb 12.8 oz (65.2 kg)     GEN:  Well nourished, well developed in no acute distress HEENT: Normal NECK: No JVD; No carotid bruits LYMPHATICS: No lymphadenopathy CARDIAC: RRR, no murmurs, rubs, gallops RESPIRATORY: Rhonchorous breath sounds, inspiratory wheezing noted at right lung base ABDOMEN: Soft, non-tender, non-distended MUSCULOSKELETAL:  No edema; No deformity  SKIN: Warm and dry NEUROLOGIC:  Alert and oriented x 3 PSYCHIATRIC:  Normal affect   ASSESSMENT:    1. Coronary artery disease involving native coronary artery of native heart, unspecified whether angina present   2. Pure hypercholesterolemia   3. Mixed hyperlipidemia    PLAN:    In order of problems listed above:  Coronary artery calcifications, denies chest pain, shortness of breath with exertion.  Echo with preserved ejection fraction.  Myoview in 2020 with no significant ischemia.  Left heart cath recommended, patient declined invasive testing as she would not like any  stent placement.  Continue aspirin, increase Lipitor to 20 mg daily. Hyperlipidemia, increase Lipitor to 20 mg daily.  Repeat fasting lipid profile in 3 months.  Follow-up in 3 months after repeat fasting lipid profile.       Medication Adjustments/Labs and Tests Ordered: Current medicines are reviewed at length with the patient today.  Concerns regarding medicines are outlined above.  Orders Placed This Encounter  Procedures   Lipid panel   EKG 12-Lead    Meds ordered this encounter  Medications   atorvastatin (LIPITOR) 20 MG tablet    Sig: Take 0.5 tablets (10 mg total) by mouth at bedtime.    Dispense:  30 tablet    Refill:  5     Patient Instructions  Medication Instructions:   Your physician has recommended you make the following change in your medication:   INCREASE your Lipitor to 20 MG once a day.  *If you need a refill on your cardiac medications before your next appointment, please call your pharmacy*   Lab Work:  Your physician recommends that you return for a FASTING lipid profile: IN 3 MONTHS  - You will need to be fasting. Please do not have anything to eat or drink after midnight the morning you have the lab work. You may only have water or black coffee with no cream or sugar.   Please return to our office on ________________at_________________am/pm  Testing/Procedures: None ordered   Follow-Up: At [facility], you and your health needs are our priority.  As part of our continuing mission to provide you with exceptional heart care, we have created designated Provider Care Teams.  These Care Teams include your primary Cardiologist (physician) and Advanced Practice Providers (APPs -  Physician Assistants and Nurse Practitioners) who all work together to provide you with the care you need, when you need it.  We recommend signing up for the patient portal called "MyChart".  Sign up information is provided on this After Visit Summary.  MyChart is used  to connect with patients for Virtual Visits (Telemedicine).  Patients are able to view lab/test results, encounter notes, upcoming appointments, etc.  Non-urgent messages can be sent to your provider as well.   To learn more about what you can do with MyChart, go to [email or web address].    Your next appointment:   3 month(s)  The format for your next appointment:   In Person  Provider:   You may see Kate Sable, MD or one of the following Advanced Practice Providers on your designated Care Team:   Murray Hodgkins, NP Christell Faith, PA-C Marrianne Mood, PA-C Cadence Hays, [place]   Other Instructions    Signed, Kate Sable, MD  02/17/2021 12:39 PM    [place]

## 2021-02-17 NOTE — Patient Instructions (Signed)
Medication Instructions:   Your physician has recommended you make the following change in your medication:   INCREASE your Lipitor to 20 MG once a day.  *If you need a refill on your cardiac medications before your next appointment, please call your pharmacy*   Lab Work:  Your physician recommends that you return for a FASTING lipid profile: IN 3 MONTHS  - You will need to be fasting. Please do not have anything to eat or drink after midnight the morning you have the lab work. You may only have water or black coffee with no cream or sugar.   Please return to our office on ________________at_________________am/pm  Testing/Procedures: None ordered   Follow-Up: At [facility], you and your health needs are our priority.  As part of our continuing mission to provide you with exceptional heart care, we have created designated Provider Care Teams.  These Care Teams include your primary Cardiologist (physician) and Advanced Practice Providers (APPs -  Physician Assistants and Nurse Practitioners) who all work together to provide you with the care you need, when you need it.  We recommend signing up for the patient portal called "MyChart".  Sign up information is provided on this After Visit Summary.  MyChart is used to connect with patients for Virtual Visits (Telemedicine).  Patients are able to view lab/test results, encounter notes, upcoming appointments, etc.  Non-urgent messages can be sent to your provider as well.   To learn more about what you can do with MyChart, go to [email or web address].    Your next appointment:   3 month(s)  The format for your next appointment:   In Person  Provider:   You may see Kate Sable, MD or one of the following Advanced Practice Providers on your designated Care Team:   Murray Hodgkins, NP Christell Faith, PA-C Marrianne Mood, PA-C Cadence Kathlen Mody, [place]   Other Instructions

## 2021-02-23 ENCOUNTER — Other Ambulatory Visit: Payer: Self-pay | Admitting: Family Medicine

## 2021-02-23 DIAGNOSIS — F330 Major depressive disorder, recurrent, mild: Secondary | ICD-10-CM

## 2021-02-24 ENCOUNTER — Telehealth: Payer: Self-pay | Admitting: Pulmonary Disease

## 2021-02-24 MED ORDER — STIOLTO RESPIMAT 2.5-2.5 MCG/ACT IN AERS: 2.5000 ug | INHALATION_SPRAY | Freq: Two times a day (BID) | RESPIRATORY_TRACT | 0 refills | Status: DC

## 2021-02-24 NOTE — Telephone Encounter (Signed)
Spoke to patient.  Patient stated that she started [place] about one month ago.  She noticed that after she uses Breztri she develops wheezing.  She has not used Breztri in three days and wheezing has subsided.  Denies additional sx.  Dr. Patsey Berthold, please advise. Thanks

## 2021-02-24 NOTE — Telephone Encounter (Signed)
Lm for patient.  

## 2021-02-24 NOTE — Telephone Encounter (Signed)
Stiolto samples placed up front and nothing further needed.

## 2021-02-24 NOTE — Telephone Encounter (Signed)
We could certainly switch her to Stiolto 2 inhalations daily and provide her samples if she would like to try it.  Discontinue Breztri.

## 2021-03-10 ENCOUNTER — Encounter: Payer: Self-pay | Admitting: Nurse Practitioner

## 2021-03-10 ENCOUNTER — Ambulatory Visit (INDEPENDENT_AMBULATORY_CARE_PROVIDER_SITE_OTHER): Payer: Medicare Other | Admitting: Nurse Practitioner

## 2021-03-10 ENCOUNTER — Other Ambulatory Visit: Payer: Self-pay

## 2021-03-10 VITALS — BP 110/70 | HR 95 | Temp 98.1°F | Resp 18 | Ht 62.0 in

## 2021-03-10 DIAGNOSIS — F419 Anxiety disorder, unspecified: Secondary | ICD-10-CM | POA: Diagnosis not present

## 2021-03-10 DIAGNOSIS — J441 Chronic obstructive pulmonary disease with (acute) exacerbation: Secondary | ICD-10-CM | POA: Diagnosis not present

## 2021-03-10 DIAGNOSIS — F330 Major depressive disorder, recurrent, mild: Secondary | ICD-10-CM

## 2021-03-10 MED ORDER — ALBUTEROL SULFATE HFA 108 (90 BASE) MCG/ACT IN AERS: 2.0000 | INHALATION_SPRAY | Freq: Four times a day (QID) | RESPIRATORY_TRACT | 0 refills | Status: DC | PRN

## 2021-03-10 MED ORDER — PAROXETINE HCL 40 MG PO TABS: 40.0000 mg | ORAL_TABLET | Freq: Every morning | ORAL | 3 refills | Status: DC

## 2021-03-10 NOTE — Progress Notes (Signed)
BP 110/70   Pulse 95   Temp 98.1 F (36.7 C)   Resp 18   Ht 5\' 2"  (1.575 m)   SpO2 96%   BMI 26.16 kg/m    Subjective:    Patient ID: Christine Burnett, female    DOB: 06-29-1940, 80 y.o.   MRN: [id number]  HPI: Christine Burnett is a 80 y.o. female, here with daughter   Follow-up for depression  Depression: She restarted Paxil a month ago.  She says that she feels better on the Paxil.  PHQ9 is negative today.  Will refill today. She says she still panics when she cannot breath and reassured   COPD/Wheezing: She says that even though she has been taking her Judithann Sauger she is still wheezing.  Discussed using an albuterol inhaler.  She says she does not have one anymore.  She says she is also going to follow-up with pulmonology about wearing oxygen during the day instead of just at night.    Patient almost fell when getting out of her car today for her appointment.  Discussed with patient that she needs to move slowly when changing positions.   Depression screen [facility] 2/9 03/10/2021 02/09/2021 02/09/2021 02/08/2021 05/03/2020  Decreased Interest 0 0 0 0 0  Down, Depressed, Hopeless 0 1 0 0 0  PHQ - 2 Score 0 1 0 0 0  Altered sleeping 0 0 0 - 0  Tired, decreased energy 0 1 0 - 1  Change in appetite 0 0 0 - 0  Feeling bad or failure about yourself  0 1 0 - 0  Trouble concentrating 0 0 0 - 0  Moving slowly or fidgety/restless 0 0 0 - 0  Suicidal thoughts 0 0 0 - 0  PHQ-9 Score 0 3 0 - 1  Difficult doing work/chores Not difficult at all Somewhat difficult Not difficult at all - Not difficult at all  Some recent data might be hidden     Relevant past medical, surgical, family and social history reviewed and updated as indicated. Interim medical history since our last visit reviewed. Allergies and medications reviewed and updated.  Review of Systems  Constitutional: Negative for fever or weight change.  Respiratory: Positivee for cough and shortness of breath.   Cardiovascular:  Negative for chest pain or palpitations.  Gastrointestinal: Negative for abdominal pain, no bowel changes.  Musculoskeletal: Negative for gait problem or joint swelling.  Skin: Negative for rash.  Neurological: Negative for dizziness or headache.  No other specific complaints in a complete review of systems (except as listed in HPI above).      Objective:    BP 110/70   Pulse 95   Temp 98.1 F (36.7 C)   Resp 18   Ht 5\' 2"  (1.575 m)   SpO2 96%   BMI 26.16 kg/m   Wt Readings from Last 3 Encounters:  02/17/21 143 lb (64.9 kg)  02/09/21 139 lb 11.2 oz (63.4 kg)  02/08/21 143 lb 12.8 oz (65.2 kg)    Physical Exam  Constitutional: Patient appears well-developed and well-nourished. No distress.  HEENT: head atraumatic, normocephalic, pupils equal and reactive to light,  neck supple Cardiovascular: Normal rate, regular rhythm and normal heart sounds.  No murmur heard. No BLE edema. Pulmonary/Chest: Effort normal and breath expiratory wheezing noted. No respiratory distress. Abdominal: Soft.  There is no tenderness. Psychiatric: Patient has a normal mood and affect. behavior is normal. Judgment and thought content normal.   Results for orders placed  or performed in visit on 01/25/21  ECHOCARDIOGRAM COMPLETE  Result Value Ref Range   AR max vel 1.45 cm2   AV Peak grad 14.3 mmHg   Ao pk vel 1.89 m/s   S' Lateral 3.10 cm   Area-P 1/2 8.62 cm2   AV Area VTI 1.61 cm2   AV Mean grad 9.5 mmHg   AV Area mean vel 1.34 cm2   MV VTI 2.63 cm2      Assessment & Plan:   1. Mild episode of recurrent major depressive disorder (HCC)  - PARoxetine (PAXIL) 40 MG tablet; Take 1 tablet (40 mg total) by mouth every morning.  Dispense: 90 tablet; Refill: 3  2. Anxiety  - PARoxetine (PAXIL) 40 MG tablet; Take 1 tablet (40 mg total) by mouth every morning.  Dispense: 90 tablet; Refill: 3  3. Chronic obstructive pulmonary disease with acute exacerbation (HCC)  - albuterol (VENTOLIN HFA) 108  (90 Base) MCG/ACT inhaler; Inhale 2 puffs into the lungs every 6 (six) hours as needed for wheezing or shortness of breath.  Dispense: 8 g; Refill: 0     Follow up plan: Return if symptoms worsen or fail to improve.

## 2021-03-14 ENCOUNTER — Telehealth: Payer: Self-pay

## 2021-03-14 NOTE — Telephone Encounter (Signed)
Copied from [place] [id number]. Topic: General - Other >> Mar 14, 2021  3:16 PM Leward Quan A wrote: Reason for CRM: Patient called in to inform Ms Reece Packer that the pharmacy have not received Rx for PARoxetine (PAXIL) 40 MG tablet and would like it sent to  [place], [place] Phone:  [phone]  Fax:  [phone] since she is all out and Rx did not go through. Please advise

## 2021-03-15 ENCOUNTER — Other Ambulatory Visit: Payer: Self-pay

## 2021-03-15 ENCOUNTER — Emergency Department: Payer: Medicare Other

## 2021-03-15 ENCOUNTER — Telehealth: Payer: Self-pay | Admitting: Cardiology

## 2021-03-15 ENCOUNTER — Encounter: Payer: Self-pay | Admitting: Emergency Medicine

## 2021-03-15 ENCOUNTER — Other Ambulatory Visit: Payer: Self-pay | Admitting: Nurse Practitioner

## 2021-03-15 DIAGNOSIS — J449 Chronic obstructive pulmonary disease, unspecified: Secondary | ICD-10-CM | POA: Insufficient documentation

## 2021-03-15 DIAGNOSIS — N184 Chronic kidney disease, stage 4 (severe): Secondary | ICD-10-CM | POA: Diagnosis not present

## 2021-03-15 DIAGNOSIS — Z7951 Long term (current) use of inhaled steroids: Secondary | ICD-10-CM | POA: Insufficient documentation

## 2021-03-15 DIAGNOSIS — R000 Tachycardia, unspecified: Secondary | ICD-10-CM | POA: Diagnosis not present

## 2021-03-15 DIAGNOSIS — Z79899 Other long term (current) drug therapy: Secondary | ICD-10-CM | POA: Insufficient documentation

## 2021-03-15 DIAGNOSIS — I129 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 stage 4 chronic kidney disease, or unspecified chronic kidney disease: Secondary | ICD-10-CM | POA: Insufficient documentation

## 2021-03-15 DIAGNOSIS — J90 Pleural effusion, not elsewhere classified: Secondary | ICD-10-CM | POA: Diagnosis not present

## 2021-03-15 DIAGNOSIS — Z7982 Long term (current) use of aspirin: Secondary | ICD-10-CM | POA: Insufficient documentation

## 2021-03-15 DIAGNOSIS — Z85118 Personal history of other malignant neoplasm of bronchus and lung: Secondary | ICD-10-CM | POA: Insufficient documentation

## 2021-03-15 DIAGNOSIS — J189 Pneumonia, unspecified organism: Secondary | ICD-10-CM | POA: Diagnosis not present

## 2021-03-15 DIAGNOSIS — Z87891 Personal history of nicotine dependence: Secondary | ICD-10-CM | POA: Diagnosis not present

## 2021-03-15 DIAGNOSIS — J181 Lobar pneumonia, unspecified organism: Secondary | ICD-10-CM | POA: Diagnosis not present

## 2021-03-15 DIAGNOSIS — Z8616 Personal history of COVID-19: Secondary | ICD-10-CM | POA: Diagnosis not present

## 2021-03-15 DIAGNOSIS — J439 Emphysema, unspecified: Secondary | ICD-10-CM | POA: Diagnosis not present

## 2021-03-15 DIAGNOSIS — R0789 Other chest pain: Secondary | ICD-10-CM | POA: Diagnosis present

## 2021-03-15 DIAGNOSIS — R0600 Dyspnea, unspecified: Secondary | ICD-10-CM | POA: Diagnosis not present

## 2021-03-15 DIAGNOSIS — F419 Anxiety disorder, unspecified: Secondary | ICD-10-CM

## 2021-03-15 DIAGNOSIS — F330 Major depressive disorder, recurrent, mild: Secondary | ICD-10-CM

## 2021-03-15 DIAGNOSIS — Z20822 Contact with and (suspected) exposure to covid-19: Secondary | ICD-10-CM | POA: Diagnosis not present

## 2021-03-15 DIAGNOSIS — J9811 Atelectasis: Secondary | ICD-10-CM | POA: Diagnosis not present

## 2021-03-15 DIAGNOSIS — R079 Chest pain, unspecified: Secondary | ICD-10-CM | POA: Diagnosis not present

## 2021-03-15 LAB — CBC
HCT: 36.1 % (ref 36.0–46.0)
Hemoglobin: 12.2 g/dL (ref 12.0–15.0)
MCH: 34.1 pg — ABNORMAL HIGH (ref 26.0–34.0)
MCHC: 33.8 g/dL (ref 30.0–36.0)
MCV: 100.8 fL — ABNORMAL HIGH (ref 80.0–100.0)
Platelets: 226 10*3/uL (ref 150–400)
RBC: 3.58 MIL/uL — ABNORMAL LOW (ref 3.87–5.11)
RDW: 13.2 % (ref 11.5–15.5)
WBC: 7.1 10*3/uL (ref 4.0–10.5)
nRBC: 0 % (ref 0.0–0.2)

## 2021-03-15 LAB — BASIC METABOLIC PANEL
Anion gap: 9 (ref 5–15)
BUN: 36 mg/dL — ABNORMAL HIGH (ref 8–23)
CO2: 25 mmol/L (ref 22–32)
Calcium: 9.1 mg/dL (ref 8.9–10.3)
Chloride: 104 mmol/L (ref 98–111)
Creatinine, Ser: 1.74 mg/dL — ABNORMAL HIGH (ref 0.44–1.00)
GFR, Estimated: 29 mL/min — ABNORMAL LOW (ref >60)
Glucose, Bld: 131 mg/dL — ABNORMAL HIGH (ref 70–99)
Potassium: 4.3 mmol/L (ref 3.5–5.1)
Sodium: 138 mmol/L (ref 135–145)

## 2021-03-15 LAB — TROPONIN I (HIGH SENSITIVITY)
Troponin I (High Sensitivity): 5 ng/L (ref <18)
Troponin I (High Sensitivity): 5 ng/L (ref <18)

## 2021-03-15 MED ORDER — PAROXETINE HCL 40 MG PO TABS: 40.0000 mg | ORAL_TABLET | Freq: Every morning | ORAL | 3 refills | Status: DC

## 2021-03-15 NOTE — ED Triage Notes (Signed)
Pt to ED from home c/o left chest discomfort and pressure x3 days and advised by PCP to come to hospital.  Denies n/v/d, states hx of right lung cancer so "always short of breath".  Also states was supposed to have cardiac stent placed last month but refused at the time.  Pt states non productive cough x1 week.  Pt A&Ox4, chest rise even and unlabored, skin WNL, and in NAD at this time.

## 2021-03-15 NOTE — Telephone Encounter (Signed)
Pt c/o of Chest Pain: STAT if CP now or developed within 24 hours  1. Are you having CP right now? yes  2. Are you experiencing any other symptoms (ex. SOB, nausea, vomiting, sweating)? no  3. How long have you been experiencing CP? 2-3 days  4. Is your CP continuous or coming and going? continuous  5. Have you taken Nitroglycerin? no ?

## 2021-03-15 NOTE — Telephone Encounter (Signed)
Patients daughter called back and I informed her of the conversation I had with her mother. I reiterated that we would recommend that she go to the ER. She informed me that her mother had been talking more about getting a stent placed and that she might be ready to do that. She stated that she would try and encourage her to go to the ER today.  She was very grateful for the call.

## 2021-03-15 NOTE — Telephone Encounter (Signed)
Called patient back and spoke with her. Patient described her chest pain as a pressure that was located about 2 inches above her left nipple. She stated that it has been there for the last couple of days. She denies any radiating pain, SOB, or back pain. I reviewed her assessment notes with her from her OV with Dr. Garen Lah on 02/17/21 as copied below:  Coronary artery calcifications, denies chest pain, shortness of breath with exertion.  Echo with preserved ejection fraction.  Myoview in 2020 with no significant ischemia.  Left heart cath recommended, patient declined invasive testing as she would not like any stent placement.  Continue aspirin, increase Lipitor to 20 mg daily.  I advised that with her CAD, hx of stroke, and recent recommendations that it would be best for her to go to the ER for an evaluation, that we do not have any appointments available in the office, and this would be the best treatment option. I also advised that the patient that she should take her Aspirin 81 MG now that she usually takes at night. Patient agreed to take the aspirin, however she stated that she did not want to go to the hospital and would rather just rest at home. I informed her of the risks of doing so if she were having a Myocardial event, patient stated that she would be fine at home.  I saw permission in her chart that I could speak with her daughter Wanda Plump. I called the daughter and left a VM requesting a call back to our office so I could let her know our recommendations for the patients care.

## 2021-03-16 ENCOUNTER — Emergency Department: Payer: Medicare Other

## 2021-03-16 ENCOUNTER — Emergency Department
Admission: EM | Admit: 2021-03-16 | Discharge: 2021-03-16 | Disposition: A | Discharge status: Home / Self Care | Payer: Medicare Other | Attending: Emergency Medicine | Admitting: Emergency Medicine

## 2021-03-16 DIAGNOSIS — J9811 Atelectasis: Secondary | ICD-10-CM | POA: Diagnosis not present

## 2021-03-16 DIAGNOSIS — R0600 Dyspnea, unspecified: Secondary | ICD-10-CM | POA: Diagnosis not present

## 2021-03-16 DIAGNOSIS — J189 Pneumonia, unspecified organism: Secondary | ICD-10-CM

## 2021-03-16 DIAGNOSIS — J181 Lobar pneumonia, unspecified organism: Secondary | ICD-10-CM | POA: Diagnosis not present

## 2021-03-16 DIAGNOSIS — R079 Chest pain, unspecified: Secondary | ICD-10-CM | POA: Diagnosis not present

## 2021-03-16 LAB — D-DIMER, QUANTITATIVE: D-Dimer, Quant: 2.3 ug/mL-FEU — ABNORMAL HIGH (ref 0.00–0.50)

## 2021-03-16 LAB — RESP PANEL BY RT-PCR (FLU A&B, COVID) ARPGX2
Influenza A by PCR: NEGATIVE
Influenza B by PCR: NEGATIVE
SARS Coronavirus 2 by RT PCR: NEGATIVE

## 2021-03-16 MED ORDER — ACETAMINOPHEN 500 MG PO TABS
1000.0000 mg | ORAL_TABLET | Freq: Once | ORAL | Status: AC
  Administered 2021-03-16: 1000 mg via ORAL
  Filled 2021-03-16: qty 2

## 2021-03-16 MED ORDER — AZITHROMYCIN 500 MG PO TABS
500.0000 mg | ORAL_TABLET | Freq: Once | ORAL | Status: AC
  Administered 2021-03-16: 500 mg via ORAL
  Filled 2021-03-16: qty 1

## 2021-03-16 MED ORDER — AMOXICILLIN-POT CLAVULANATE 875-125 MG PO TABS: 1.0000 | ORAL_TABLET | Freq: Two times a day (BID) | ORAL | 0 refills | Status: AC

## 2021-03-16 MED ORDER — LIDOCAINE 5 % EX PTCH
1.0000 | MEDICATED_PATCH | CUTANEOUS | Status: DC
  Administered 2021-03-16: 1 via TRANSDERMAL
  Filled 2021-03-16: qty 1

## 2021-03-16 MED ORDER — IOHEXOL 350 MG/ML SOLN
50.0000 mL | Freq: Once | INTRAVENOUS | Status: AC | PRN
  Administered 2021-03-16: 50 mL via INTRAVENOUS

## 2021-03-16 MED ORDER — AMOXICILLIN-POT CLAVULANATE 875-125 MG PO TABS
1.0000 | ORAL_TABLET | Freq: Once | ORAL | Status: AC
  Administered 2021-03-16: 1 via ORAL
  Filled 2021-03-16: qty 1

## 2021-03-16 MED ORDER — LACTATED RINGERS IV BOLUS
1000.0000 mL | Freq: Once | INTRAVENOUS | Status: AC
  Administered 2021-03-16: 1000 mL via INTRAVENOUS

## 2021-03-16 MED ORDER — AZITHROMYCIN 250 MG PO TABS: ORAL_TABLET | ORAL | 0 refills | Status: DC

## 2021-03-16 NOTE — ED Notes (Signed)
Pt declined discharge vitals at this time

## 2021-03-16 NOTE — Telephone Encounter (Signed)
Called patient to inquire about how she is feeling. She did go to the ER last night and was found to have pneumonia. With Xrays performed in ER they also saw calcifications in the coronary arteries and urged her to follow up with [place]. Patient stated that she is now ready to have a cardiac cath as recommended by Dr. Garen Lah during last office visit on 02/17/21. I rescheduled patient to come in this Friday at 1040 so she can talk to Dr. Launa Grill about scheduling the Cardiac Cath.

## 2021-03-16 NOTE — ED Provider Notes (Signed)
[facility] Emergency Department Provider Note  ____________________________________________  Time seen: Approximately 1:41 AM  I have reviewed the triage vital signs and the nursing notes.   HISTORY  Chief Complaint Chest Pain   HPI Christine Burnett is a 80 y.o. 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  Patient reports 10 days of a productive cough.  She reports that she is not able to fully bring up the phlegm so she does not know what color it is.  Over the last 3 days she has had left-sided chest pain that she describes as pressure and is worse with deep inspiration.  She denies fever or chills, body aches, vomiting or diarrhea.  She denies any prior history of PE or DVT, no recent travel immobilization, no leg pain or swelling, no hemoptysis or exogenous hormones.  Patient denies any changes in her chronic shortness of breath.   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   Managed by nephrologist   Non-small cell lung cancer (NSCLC) ([place])    Osteoporosis    Oxygen deficiency Me   Risk for falls    Situational disturbance    Stroke ([place]) 2011   tia's x 2   TIA (transient ischemic attack)     Patient Active Problem List   Diagnosis Date Noted   COPD (chronic obstructive pulmonary disease) ([place]) 01/28/2021   CKD (chronic kidney disease) stage 4, GFR  15-29 ml/min ([place]) 01/21/2021   Goals of care, counseling/discussion 07/17/2020   Malignant neoplasm of lower lobe of right lung ([place]) 07/04/2020   Pneumonia due to COVID-19 virus 03/17/2020   Nocturnal hypoxia 03/17/2020   Prediabetes 08/13/2018   Allergic rhinitis 09/27/2017   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   Stage 3b chronic kidney disease ([place]) 08/31/2017   Hyperparathyroidism, 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   Osteoporosis without current pathological fracture 03/30/2017   Cardiac murmur 04/07/2015   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 stage 4 chronic kidney disease, or unspecified chronic kidney disease 02/10/2015   Anxiety 01/05/2015   Hyperlipidemia 01/05/2015   HTN (hypertension) 09/24/2014    Past Surgical History:  Procedure Laterality Date   COLONOSCOPY  2016   ESOPHAGUS SURGERY     INTRAMEDULLARY (IM) NAIL INTERTROCHANTERIC Left 03/26/2017   Procedure: INTRAMEDULLARY (IM) NAIL INTERTROCHANTRIC;  Surgeon: Hessie Knows, MD;  Location: [facility] ORS;  Service: Orthopedics;  Laterality: Left;   VIDEO BRONCHOSCOPY WITH ENDOBRONCHIAL NAVIGATION Right 06/28/2020   Procedure: VIDEO BRONCHOSCOPY WITH ENDOBRONCHIAL NAVIGATION CELLVIZIO;  Surgeon: Tyler Pita, MD;  Location: [facility] ORS;  Service: Pulmonary;  Laterality: Right;    Prior to Admission medications   Medication Sig Start Date End Date Taking? Authorizing Provider  amoxicillin-clavulanate (AUGMENTIN) 875-125 MG tablet Take 1 tablet by mouth 2 (two) times daily for 10 days. 03/16/21 03/26/21 Yes Rudene Re, MD  azithromycin (ZITHROMAX) 250 MG tablet Take 1 a day for 4 days 03/16/21  Yes Alfred Levins, [place], MD  acetaminophen (TYLENOL) 650 MG CR tablet Take 1 tablet (650 mg total) by mouth 4 (four) times daily as needed for pain. 01/30/20   Delsa Grana, PA-C  albuterol (VENTOLIN HFA) 108 (90 Base) MCG/ACT inhaler Inhale 2 puffs into the lungs every 6 (six) hours as needed for wheezing or  shortness of breath. 03/10/21   Bo Merino, FNP  aspirin 81 MG EC tablet Take 81 mg by mouth daily. Swallow whole.    [provider]  atorvastatin (LIPITOR) 20 MG tablet Take 0.5 tablets (10 mg total) by mouth at bedtime. 02/17/21   Kate Sable, MD  Budeson-Glycopyrrol-Formoterol (BREZTRI AEROSPHERE) 160-9-4.8 MCG/ACT AERO Inhale 2 puffs into the lungs in the morning and at bedtime. 01/13/21   Tyler Pita, MD  Calcium 200 MG TABS Take 1 tablet by mouth daily.    [provider]  Cholecalciferol (VITAMIN D-3) 1000 units CAPS Take 2,000 Units by mouth daily. 08/09/17   Lada, Satira Anis, MD  melatonin 3 MG TABS tablet Take 3 mg by mouth at bedtime.    [provider]  PARoxetine (PAXIL) 40 MG tablet Take 1 tablet (40 mg total) by mouth every morning. 03/15/21   Bo Merino, FNP  Probiotic Product (PROBIOTIC-10 PO) Take 1 capsule by mouth daily.    [provider]  Spacer/Aero-Holding Chambers DEVI USE AS DIRECTED 12/23/20   Tyler Pita, MD  Tiotropium Bromide-Olodaterol (STIOLTO RESPIMAT) 2.5-2.5 MCG/ACT AERS Inhale 2.5 mcg into the lungs 2 (two) times daily. 02/24/21   Tyler Pita, MD    Allergies Cefuroxime axetil, Ciprofloxacin, and Codeine  Family History  Problem Relation Age of Onset   Other Mother        [place] posioning   Kidney disease Mother    Heart attack Father    Prostate cancer Father    Cancer Father    Heart disease Father    Heart attack Brother    Diabetes Brother    Hypertension Daughter    Kidney cancer Neg Hx    Bladder Cancer Neg Hx    Colon cancer Neg Hx    Colon polyps Neg Hx    Rectal cancer Neg Hx     Social History Social History   Tobacco Use   Smoking status: Former    Packs/day: 1.00    Years: 61.00    Pack years: 61.00    Types: Cigarettes    Quit date: 03/25/2017    Years since quitting: 3.9   Smokeless tobacco: Never  Vaping Use   Vaping Use: Never used  Substance Use  Topics   Alcohol use: No   Drug use: No    Review of Systems  Constitutional: Negative for fever. Eyes: Negative for visual changes. ENT: Negative for sore throat. Neck: No neck pain  Cardiovascular: + chest pain. Respiratory: Negative for shortness of breath. + cough Gastrointestinal: Negative for abdominal pain, vomiting or diarrhea. Genitourinary: Negative for dysuria. Musculoskeletal: Negative for back pain. Skin: Negative for rash. Neurological: Negative for headaches, weakness or numbness. Psych: No SI or HI  ____________________________________________   PHYSICAL EXAM:  VITAL SIGNS: Vitals:   03/15/21 2322 03/16/21 0100  BP: 133/88 115/89  Pulse: 87 88  Resp: 19   Temp:    SpO2: 97% 100%     Constitutional: Alert and oriented. Well appearing and in no apparent distress. HEENT:  Head: Normocephalic and atraumatic.         Eyes: Conjunctivae are normal. Sclera is non-icteric.       Mouth/Throat: Mucous membranes are moist.       Neck: Supple with no signs of meningismus. Cardiovascular: Regular rate and rhythm. No murmurs, gallops, or rubs. 2+ symmetrical distal pulses are present in all extremities. No JVD. Respiratory: Normal respiratory effort.  Good air movement bilaterally with localized faint wheezes and crackles on the right base but clear otherwise Gastrointestinal: Soft, non tender, and non distended with positive bowel sounds. No rebound or guarding. Genitourinary: No CVA tenderness. Musculoskeletal:  No edema, cyanosis, or erythema of extremities. Neurologic: Normal speech and language. Face is symmetric. Moving all extremities. No gross focal neurologic deficits are appreciated. Skin: Skin is warm, dry and intact. No rash noted. Psychiatric: Mood and affect are normal. Speech and behavior are normal.  ____________________________________________   LABS (all labs ordered are listed, but only abnormal results are displayed)  Labs Reviewed   BASIC METABOLIC PANEL - Abnormal; Notable for the following components:      Result Value   Glucose, Bld 131 (*)    BUN 36 (*)    Creatinine, Ser 1.74 (*)    GFR, Estimated 29 (*)    All other components within normal limits  CBC - Abnormal; Notable for the following components:   RBC 3.58 (*)    MCV 100.8 (*)    MCH 34.1 (*)    All other components within normal limits  D-DIMER, QUANTITATIVE - Abnormal; Notable for the following components:   D-Dimer, Quant 2.30 (*)    All other components within normal limits  RESP PANEL BY RT-PCR (FLU A&B, COVID) ARPGX2  TROPONIN I (HIGH SENSITIVITY)  TROPONIN I (HIGH SENSITIVITY)   ____________________________________________  EKG  ED ECG REPORT I, Rudene Re, the attending physician, personally viewed and interpreted this ECG.  Sinus tachycardia with a rate of 109, normal intervals, normal axis, no ST elevations or depressions ____________________________________________  RADIOLOGY  I have personally reviewed the images performed during this visit and I agree with the Radiologist's read.   Interpretation by Radiologist:  DG Chest 2 View  Result Date: 03/15/2021 CLINICAL DATA:  Left chest pain EXAM: CHEST - 2 VIEW COMPARISON:  11/29/2020 FINDINGS: Mild pulmonary hyperinflation and coarsening of the pulmonary interstitium is in keeping with underlying emphysema, better appreciated on CT examination of 01/17/2021. In the interval, small bilateral pleural effusions have developed. Focal pulmonary infiltrate has developed within the right lung base, possibly infectious or inflammatory in nature. No pneumothorax. Cardiac size within normal limits. Pulmonary vascularity is otherwise normal. No acute bone abnormality. IMPRESSION: Right basilar focal pulmonary infiltrate, possibly infectious or inflammatory. Superimposed small bilateral pleural effusions. Emphysema. Electronically Signed   By: Fidela Salisbury M.D.   On: 03/15/2021 19:43    CT Angio Chest PE W and/or Wo Contrast  Result Date: 03/16/2021 CLINICAL DATA:  Left chest pain and pressure. History of lung cancer. Chronic dyspnea EXAM: CT ANGIOGRAPHY CHEST WITH CONTRAST TECHNIQUE: Multidetector CT imaging of the chest was performed using the standard protocol during bolus administration of intravenous contrast. Multiplanar CT image reconstructions and MIPs were obtained to evaluate the vascular anatomy. CONTRAST:  1mL OMNIPAQUE IOHEXOL 350 MG/ML SOLN COMPARISON:  01/17/2021 FINDINGS: Cardiovascular: Adequate opacification of the pulmonary arterial tree. No intraluminal filling defect identified to suggest acute pulmonary embolism. Extensive multi-vessel coronary artery calcification. Global cardiac size within normal limits. No pericardial effusion. Moderate atherosclerotic calcification  within the thoracic aorta. No aortic aneurysm. Mediastinum/Nodes: No enlarged mediastinal, hilar, or axillary lymph nodes. Thyroid gland, trachea, and esophagus demonstrate no significant findings. Lungs/Pleura: Moderate centrilobular emphysema. Is extensive airway impaction within the right lower lobe which may relate to aspiration or airway inflammation with resultant subsegmental collapse of the right lower lobe. Discoid atelectasis noted within the right middle lobe. Mosaic attenuation is seen within the pulmonary parenchyma in keeping with multifocal air trapping related to small airways disease. No pneumothorax or pleural effusion. Upper Abdomen: No acute abnormality. Musculoskeletal: No acute bone abnormality. Review of the MIP images confirms the above findings. IMPRESSION: No pulmonary embolism. Extensive airway impaction within the right lower lobe with subsegmental atelectasis within the right lower lobe. This may relate to aspiration or airway inflammation. Moderate centrilobular emphysema. Extensive multi-vessel coronary artery calcification. Aortic Atherosclerosis (ICD10-I70.0) and  Emphysema (ICD10-J43.9). Electronically Signed   By: Fidela Salisbury M.D.   On: 03/16/2021 01:34     ____________________________________________   PROCEDURES  Procedure(s) performed:yes .1-3 Lead EKG Interpretation Performed by: Rudene Re, MD Authorized by: Rudene Re, MD     Interpretation: non-specific     ECG rate assessment: tachycardic     Rhythm: sinus tachycardia     Ectopy: none     Conduction: normal     Critical Care performed:  None ____________________________________________   INITIAL IMPRESSION / ASSESSMENT AND PLAN / ED COURSE  80 y.o. 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  Patient is well-appearing and in no distress, sats are 100% on room air, she does have localized crackle and faint wheezes at the right base but she is in no respiratory distress.  She looks euvolemic.  No asymmetric leg swelling.  EKG showing no signs of ischemia.  Initial chest x-ray concerning for possible infiltrate on the right lower lobe the patient had an elevated D-dimer therefore a CTA was done.  No signs of PE but she does have developing pneumonia on the right lower lobe.  Patient denies any history of aspiration.  She has no signs of sepsis, normal white count, no difficulty breathing, no respiratory distress, she is on room air.  2 high-sensitivity troponins with no signs of demand ischemia.  COVID and flu negative.  Patient really wishes to go home.  Will discharge home on Augmentin and azithromycin with close follow-up with primary care doctor.  Recommended checking her oxygen frequently at home and returning to the hospital for sats in the low 90s, new or worsening chest pain or shortness of breath.  History is gathered from patient and her daughter who was at bedside.  Plan was discussed with both of them.  Patient was monitored  on telemetry with no signs of dysrhythmias or hypoxia.  Old medical records reviewed.  Also discussed the findings of extensive multivessel coronary artery disease.  Patient has an appointment with her cardiologist who is planning a left heart catheterization.  Recommended calling the cardiologist first thing in the morning so he can look at the CT and plan accordingly.      _____________________________________________ Please note:  Patient was evaluated in Emergency Department today for the symptoms described in the history of present illness. Patient was evaluated in the context of the global COVID-19 pandemic, which necessitated consideration that the patient might be at risk for infection with the SARS-CoV-2 virus that causes COVID-19. Institutional protocols and  algorithms that pertain to the evaluation of patients at risk for COVID-19 are in a state of rapid change based on information released by regulatory bodies including the CDC and federal and state organizations. These policies and algorithms were followed during the patient's care in the ED.  Some ED evaluations and interventions may be delayed as a result of limited staffing during the pandemic.   [place] Controlled Substance Database was reviewed by me. ____________________________________________   FINAL CLINICAL IMPRESSION(S) / ED DIAGNOSES   Final diagnoses:  Community acquired pneumonia of right lower lobe of lung      NEW MEDICATIONS STARTED DURING THIS VISIT:  ED Discharge Orders          Ordered    amoxicillin-clavulanate (AUGMENTIN) 875-125 MG tablet  2 times daily        03/16/21 0148    azithromycin (ZITHROMAX) 250 MG tablet        03/16/21 0148             Note:  This document was prepared using Dragon voice recognition software and may include unintentional dictation errors.    Rudene Re, MD 03/16/21 [phone]

## 2021-03-18 ENCOUNTER — Ambulatory Visit (INDEPENDENT_AMBULATORY_CARE_PROVIDER_SITE_OTHER): Payer: Medicare Other | Admitting: Cardiology

## 2021-03-18 ENCOUNTER — Encounter: Payer: Self-pay | Admitting: Cardiology

## 2021-03-18 ENCOUNTER — Other Ambulatory Visit: Payer: Self-pay

## 2021-03-18 VITALS — BP 120/60 | HR 94 | Ht 67.0 in | Wt 141.0 lb

## 2021-03-18 DIAGNOSIS — E7800 Pure hypercholesterolemia, unspecified: Secondary | ICD-10-CM | POA: Diagnosis not present

## 2021-03-18 DIAGNOSIS — E782 Mixed hyperlipidemia: Secondary | ICD-10-CM

## 2021-03-18 DIAGNOSIS — R072 Precordial pain: Secondary | ICD-10-CM

## 2021-03-18 DIAGNOSIS — I2510 Atherosclerotic heart disease of native coronary artery without angina pectoris: Secondary | ICD-10-CM

## 2021-03-18 MED ORDER — ATORVASTATIN CALCIUM 40 MG PO TABS: 40.0000 mg | ORAL_TABLET | Freq: Every day | ORAL | 3 refills | Status: DC

## 2021-03-18 NOTE — Patient Instructions (Signed)
Medication Instructions:   Your physician has recommended you make the following change in your medication:    INCREASE your Lipitor to 40 MG once a day.   *If you need a refill on your cardiac medications before your next appointment, please call your pharmacy*   Lab Work:  Your physician recommends that you return for a FASTING lipid profile: Next week sometime  - You will need to be fasting. Please do not have anything to eat or drink after midnight the morning you have the lab work. You may only have water or black coffee with no cream or sugar.   Please return to our office on _________________at____________________    Testing/Procedures:  None ordered   Follow-Up: At [facility], you and your health needs are our priority.  As part of our continuing mission to provide you with exceptional heart care, we have created designated Provider Care Teams.  These Care Teams include your primary Cardiologist (physician) and Advanced Practice Providers (APPs -  Physician Assistants and Nurse Practitioners) who all work together to provide you with the care you need, when you need it.  We recommend signing up for the patient portal called "MyChart".  Sign up information is provided on this After Visit Summary.  MyChart is used to connect with patients for Virtual Visits (Telemedicine).  Patients are able to view lab/test results, encounter notes, upcoming appointments, etc.  Non-urgent messages can be sent to your provider as well.   To learn more about what you can do with MyChart, go to [email or web address].    Your next appointment:   Keep follow up appointment   The format for your next appointment:   In person  Provider:   You may see Kate Sable, MD or one of the following Advanced Practice Providers on your designated Care Team:   Murray Hodgkins, NP Christell Faith, PA-C Marrianne Mood, PA-C Cadence Kathlen Mody, [place]   Other Instructions

## 2021-03-18 NOTE — H&P (View-Only) (Signed)
Cardiology Office Note:    Date:  03/18/2021   ID:  Christine Burnett, DOB December 13, 1940, MRN [id number]  PCP:  Delsa Grana, PA-C   [facility] Providers Cardiologist:  Kate Sable, MD     Referring MD: Delsa Grana, PA-C   Chief Complaint  Patient presents with   Other    Patient is ready to schedule Cath. Patient c.o SOB. Meds reviewed verbally with patient.     History of Present Illness:    Christine Burnett is a 80 y.o. female with a hx of anxiety, former smoker x50 years, COPD, hyperlipidemia, TIA who presents for follow-up.    Seen in the ED 2 days ago due to chest pain and cough.  Symptoms occurred suddenly while patient was at home sitting.  Describes left-sided chest pressure causing her to present to the ED.  CTA chest was negative for PE, developing pneumonia was diagnosed in the right lower lobe.  She was started on antibiotics.  EKG showed sinus tachycardia with no ischemia, troponins were normal.  Prior chest CT obtained 01/2021 reviewed by myself showed significant LAD and RCA calcifications.  She takes aspirin and Lipitor.  Previously declined any invasive cardiac procedures such as left heart cath.  She is now willing to proceed if needed.  Has symptoms of chest pain/pressure are improving with antibiotics.  She was prescribed a 10-day course of antibiotics, has been taking for 3 days now.   Prior notes Echo 01/2021 EF 60 to 65%, impaired relaxation. Lexiscan Myoview 03/2019 low risk study, no evidence for ischemia.  Past Medical History:  Diagnosis Date   Allergic rhinitis    Anxiety    Arthritis Me   Chronic bronchitis (HCC)    Chronic kidney disease, stage III (moderate) ([place]) 08/31/2017   Seeing nephrologist   CKD (chronic kidney disease), stage III ([place])    COPD (chronic obstructive pulmonary disease) ([place])    Cornea scar    Depression    unspecified   Diverticulosis    Emphysema of lung ([place]) Me   Hx of fracture of left hip 12/24/2017    Hyperlipidemia    Hyperparathyroidism, secondary renal ([place]) 08/31/2017   Managed by nephrologist   Non-small cell lung cancer (NSCLC) ([place])    Osteoporosis    Oxygen deficiency Me   Risk for falls    Situational disturbance    Stroke ([place]) 2011   tia's x 2   TIA (transient ischemic attack)     Past Surgical History:  Procedure Laterality Date   COLONOSCOPY  2016   ESOPHAGUS SURGERY     INTRAMEDULLARY (IM) NAIL INTERTROCHANTERIC Left 03/26/2017   Procedure: INTRAMEDULLARY (IM) NAIL INTERTROCHANTRIC;  Surgeon: Hessie Knows, MD;  Location: [facility] ORS;  Service: Orthopedics;  Laterality: Left;   VIDEO BRONCHOSCOPY WITH ENDOBRONCHIAL NAVIGATION Right 06/28/2020   Procedure: VIDEO BRONCHOSCOPY WITH ENDOBRONCHIAL NAVIGATION CELLVIZIO;  Surgeon: Tyler Pita, MD;  Location: [facility] ORS;  Service: Pulmonary;  Laterality: Right;    Current Medications: Current Meds  Medication Sig   acetaminophen (TYLENOL) 650 MG CR tablet Take 1 tablet (650 mg total) by mouth 4 (four) times daily as needed for pain.   albuterol (VENTOLIN HFA) 108 (90 Base) MCG/ACT inhaler Inhale 2 puffs into the lungs every 6 (six) hours as needed for wheezing or shortness of breath.   amoxicillin-clavulanate (AUGMENTIN) 875-125 MG tablet Take 1 tablet by mouth 2 (two) times daily for 10 days.   aspirin 81 MG EC tablet Take 81 mg by  mouth daily. Swallow whole.   azithromycin (ZITHROMAX) 250 MG tablet Take 1 a day for 4 days   Budeson-Glycopyrrol-Formoterol (BREZTRI AEROSPHERE) 160-9-4.8 MCG/ACT AERO Inhale 2 puffs into the lungs in the morning and at bedtime.   Calcium 200 MG TABS Take 1 tablet by mouth daily.   Cholecalciferol (VITAMIN D-3) 1000 units CAPS Take 2,000 Units by mouth daily.   melatonin 3 MG TABS tablet Take 3 mg by mouth at bedtime.   PARoxetine (PAXIL) 40 MG tablet Take 1 tablet (40 mg total) by mouth every morning.   Probiotic Product (PROBIOTIC-10 PO) Take 1 capsule by mouth daily.    Spacer/Aero-Holding Chambers DEVI USE AS DIRECTED   Tiotropium Bromide-Olodaterol (STIOLTO RESPIMAT) 2.5-2.5 MCG/ACT AERS Inhale 2.5 mcg into the lungs 2 (two) times daily.   [DISCONTINUED] atorvastatin (LIPITOR) 20 MG tablet Take 0.5 tablets (10 mg total) by mouth at bedtime.     Allergies:   Cefuroxime axetil, Ciprofloxacin, and Codeine   Social History   Socioeconomic History   Marital status: Divorced    Spouse name: Not on file   Number of children: 4   Years of education: Not on file   Highest education level: 12th grade  Occupational History   Occupation: Retired    Comment: payroll  Tobacco Use   Smoking status: Former    Packs/day: 1.00    Years: 61.00    Pack years: 61.00    Types: Cigarettes    Quit date: 03/25/2017    Years since quitting: 3.9   Smokeless tobacco: Never  Vaping Use   Vaping Use: Never used  Substance and Sexual Activity   Alcohol use: No   Drug use: No   Sexual activity: Not Currently  Other Topics Concern   Not on file  Social History Narrative   Full Code   4 children   Denies alcohol use   Patient has 1 daughters and 1 grandson living with her   Social Determinants of Radio broadcast assistant Strain: Low Risk    Difficulty of Paying Living Expenses: Not hard at all  Food Insecurity: No Food Insecurity   Worried About Charity fundraiser in the Last Year: Never true   Arboriculturist in the Last Year: Never true  Transportation Needs: No Transportation Needs   Lack of Transportation (Medical): No   Lack of Transportation (Non-Medical): No  Physical Activity: Inactive   Days of Exercise per Week: 0 days   Minutes of Exercise per Session: 0 min  Stress: Stress Concern Present   Feeling of Stress : Rather much  Social Connections: Moderately Isolated   Frequency of Communication with Friends and Family: More than three times a week   Frequency of Social Gatherings with Friends and Family: More than three times a week   Attends  Religious Services: More than 4 times per year   Active Member of [employer] or Organizations: No   Attends Music therapist: Never   Marital Status: Divorced     Family History: The patient's family history includes Cancer in her father; Diabetes in her brother; Heart attack in her brother and father; Heart disease in her father; Hypertension in her daughter; Kidney disease in her mother; Other in her mother; Prostate cancer in her father. There is no history of Kidney cancer, Bladder Cancer, Colon cancer, Colon polyps, or Rectal cancer.  ROS:   Please see the history of present illness.     All other systems reviewed  and are negative.  EKGs/Labs/Other Studies Reviewed:    The following studies were reviewed today:   EKG:  EKG not ordered today.    Recent Labs: 01/21/2021: ALT 14 03/15/2021: BUN 36; Creatinine, Ser 1.74; Hemoglobin 12.2; Platelets 226; Potassium 4.3; Sodium 138  Recent Lipid Panel    Component Value Date/Time   CHOL 137 01/29/2020 1515   CHOL 191 11/10/2015 1211   CHOL 144 07/03/2014 0526   TRIG 111 01/29/2020 1515   TRIG 320 (H) 07/03/2014 0526   HDL 45 (L) 01/29/2020 1515   HDL 46 11/10/2015 1211   HDL 33 (L) 07/03/2014 0526   CHOLHDL 3.0 01/29/2020 1515   VLDL 37 (H) 11/10/2016 1531   VLDL 64 (H) 07/03/2014 0526   LDLCALC 73 01/29/2020 1515   LDLCALC 47 07/03/2014 0526     Risk Assessment/Calculations:          Physical Exam:    VS:  BP 120/60 (BP Location: Right Arm, Patient Position: Sitting, Cuff Size: Normal)   Pulse 94   Ht 5\' 7"  (1.702 m)   Wt 141 lb (64 kg)   SpO2 97%   BMI 22.08 kg/m     Wt Readings from Last 3 Encounters:  03/18/21 141 lb (64 kg)  03/15/21 145 lb (65.8 kg)  02/17/21 143 lb (64.9 kg)     GEN:  Well nourished, well developed in no acute distress HEENT: Normal NECK: No JVD; No carotid bruits LYMPHATICS: No lymphadenopathy CARDIAC: RRR, no murmurs, rubs, gallops RESPIRATORY: Rhonchorous breath  sounds, inspiratory wheezing noted at right lung base ABDOMEN: Soft, non-tender, non-distended MUSCULOSKELETAL:  No edema; No deformity  SKIN: Warm and dry NEUROLOGIC:  Alert and oriented x 3 PSYCHIATRIC:  Normal affect   ASSESSMENT:    1. Precordial pain   2. Coronary artery disease involving native coronary artery of native heart, unspecified whether angina present   3. Pure hypercholesterolemia   4. Mixed hyperlipidemia     PLAN:    In order of problems listed above:  Left-sided chest pressure, diagnosed with pneumonia, chest pressure improving with antibiotics.  Patient advised to finish antibiotic course.  If symptoms persist after antibiotics course, will consider left heart cath.  Left heart cath discussed with patient if needed and patient willing to proceed.  CT showed heavily calcified RCA and LAD.  She is high risk for coronary event. Coronary artery calcifications, chest pain, shortness of breath with exertion.  Echo with preserved ejection fraction.  Myoview in 2020 with no significant ischemia.  Continue aspirin, Lipitor  Hyperlipidemia, increase Lipitor to 40 mg daily.  Repeat fasting lipid profile in 1 week.  Follow-up in 2 months   Shared Decision Making/Informed Consent The risks [stroke (1 in 1000), death (1 in 1000), kidney failure [usually temporary] (1 in 500), bleeding (1 in 200), allergic reaction [possibly serious] (1 in 200)], benefits (diagnostic support and management of coronary artery disease) and alternatives of a cardiac catheterization were discussed in detail with Christine Burnett and she is willing to proceed.     Medication Adjustments/Labs and Tests Ordered: Current medicines are reviewed at length with the patient today.  Concerns regarding medicines are outlined above.  No orders of the defined types were placed in this encounter.   Meds ordered this encounter  Medications   atorvastatin (LIPITOR) 40 MG tablet    Sig: Take 1 tablet (40 mg total)  by mouth at bedtime.    Dispense:  90 tablet    Refill:  3  Patient Instructions  Medication Instructions:   Your physician has recommended you make the following change in your medication:    INCREASE your Lipitor to 40 MG once a day.   *If you need a refill on your cardiac medications before your next appointment, please call your pharmacy*   Lab Work:  Your physician recommends that you return for a FASTING lipid profile: Next week sometime  - You will need to be fasting. Please do not have anything to eat or drink after midnight the morning you have the lab work. You may only have water or black coffee with no cream or sugar.   Please return to our office on _________________at____________________    Testing/Procedures:  None ordered   Follow-Up: At [facility], you and your health needs are our priority.  As part of our continuing mission to provide you with exceptional heart care, we have created designated Provider Care Teams.  These Care Teams include your primary Cardiologist (physician) and Advanced Practice Providers (APPs -  Physician Assistants and Nurse Practitioners) who all work together to provide you with the care you need, when you need it.  We recommend signing up for the patient portal called "MyChart".  Sign up information is provided on this After Visit Summary.  MyChart is used to connect with patients for Virtual Visits (Telemedicine).  Patients are able to view lab/test results, encounter notes, upcoming appointments, etc.  Non-urgent messages can be sent to your provider as well.   To learn more about what you can do with MyChart, go to [email or web address].    Your next appointment:   Keep follow up appointment   The format for your next appointment:   In person  Provider:   You may see Kate Sable, MD or one of the following Advanced Practice Providers on your designated Care Team:   Murray Hodgkins, NP Christell Faith,  PA-C Marrianne Mood, PA-C Cadence [place], [place]   Other Instructions     Signed, Kate Sable, MD  03/18/2021 12:17 PM    Christine Burnett

## 2021-03-18 NOTE — Progress Notes (Signed)
Cardiology Office Note:    Date:  03/18/2021   ID:  Christine Burnett, DOB 1940-07-02, MRN [id number]  PCP:  Delsa Grana, PA-C   [facility] Providers Cardiologist:  Kate Sable, MD     Referring MD: Delsa Grana, PA-C   Chief Complaint  Patient presents with   Other    Patient is ready to schedule Cath. Patient c.o SOB. Meds reviewed verbally with patient.     History of Present Illness:    Christine Burnett is a 80 y.o. female with a hx of anxiety, former smoker x50 years, COPD, hyperlipidemia, TIA who presents for follow-up.    Seen in the ED 2 days ago due to chest pain and cough.  Symptoms occurred suddenly while patient was at home sitting.  Describes left-sided chest pressure causing her to present to the ED.  CTA chest was negative for PE, developing pneumonia was diagnosed in the right lower lobe.  She was started on antibiotics.  EKG showed sinus tachycardia with no ischemia, troponins were normal.  Prior chest CT obtained 01/2021 reviewed by myself showed significant LAD and RCA calcifications.  She takes aspirin and Lipitor.  Previously declined any invasive cardiac procedures such as left heart cath.  She is now willing to proceed if needed.  Has symptoms of chest pain/pressure are improving with antibiotics.  She was prescribed a 10-day course of antibiotics, has been taking for 3 days now.   Prior notes Echo 01/2021 EF 60 to 65%, impaired relaxation. Lexiscan Myoview 03/2019 low risk study, no evidence for ischemia.  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   Managed by nephrologist   Non-small cell lung cancer (NSCLC) ([place])    Osteoporosis    Oxygen deficiency Me   Risk for falls    Situational disturbance    Stroke ([place]) 2011   tia's x 2   TIA (transient ischemic attack)     Past Surgical History:  Procedure Laterality Date   COLONOSCOPY  2016   ESOPHAGUS SURGERY     INTRAMEDULLARY (IM) NAIL INTERTROCHANTERIC Left 03/26/2017   Procedure: INTRAMEDULLARY (IM) NAIL INTERTROCHANTRIC;  Surgeon: Hessie Knows, MD;  Location: [facility] ORS;  Service: Orthopedics;  Laterality: Left;   VIDEO BRONCHOSCOPY WITH ENDOBRONCHIAL NAVIGATION Right 06/28/2020   Procedure: VIDEO BRONCHOSCOPY WITH ENDOBRONCHIAL NAVIGATION CELLVIZIO;  Surgeon: Tyler Pita, MD;  Location: [facility] ORS;  Service: Pulmonary;  Laterality: Right;    Current Medications: Current Meds  Medication Sig   acetaminophen (TYLENOL) 650 MG CR tablet Take 1 tablet (650 mg total) by mouth 4 (four) times daily as needed for pain.   albuterol (VENTOLIN HFA) 108 (90 Base) MCG/ACT inhaler Inhale 2 puffs into the lungs every 6 (six) hours as needed for wheezing or shortness of breath.   amoxicillin-clavulanate (AUGMENTIN) 875-125 MG tablet Take 1 tablet by mouth 2 (two) times daily for 10 days.   aspirin 81 MG EC tablet Take 81 mg by  mouth daily. Swallow whole.   azithromycin (ZITHROMAX) 250 MG tablet Take 1 a day for 4 days   Budeson-Glycopyrrol-Formoterol (BREZTRI AEROSPHERE) 160-9-4.8 MCG/ACT AERO Inhale 2 puffs into the lungs in the morning and at bedtime.   Calcium 200 MG TABS Take 1 tablet by mouth daily.   Cholecalciferol (VITAMIN D-3) 1000 units CAPS Take 2,000 Units by mouth daily.   melatonin 3 MG TABS tablet Take 3 mg by mouth at bedtime.   PARoxetine (PAXIL) 40 MG tablet Take 1 tablet (40 mg total) by mouth every morning.   Probiotic Product (PROBIOTIC-10 PO) Take 1 capsule by mouth daily.    Spacer/Aero-Holding Chambers DEVI USE AS DIRECTED   Tiotropium Bromide-Olodaterol (STIOLTO RESPIMAT) 2.5-2.5 MCG/ACT AERS Inhale 2.5 mcg into the lungs 2 (two) times daily.   [DISCONTINUED] atorvastatin (LIPITOR) 20 MG tablet Take 0.5 tablets (10 mg total) by mouth at bedtime.     Allergies:   Cefuroxime axetil, Ciprofloxacin, and Codeine   Social History   Socioeconomic History   Marital status: Divorced    Spouse name: Not on file   Number of children: 4   Years of education: Not on file   Highest education level: 12th grade  Occupational History   Occupation: Retired    Comment: payroll  Tobacco Use   Smoking status: Former    Packs/day: 1.00    Years: 61.00    Pack years: 61.00    Types: Cigarettes    Quit date: 03/25/2017    Years since quitting: 3.9   Smokeless tobacco: Never  Vaping Use   Vaping Use: Never used  Substance and Sexual Activity   Alcohol use: No   Drug use: No   Sexual activity: Not Currently  Other Topics Concern   Not on file  Social History Narrative   Full Code   4 children   Denies alcohol use   Patient has 1 daughters and 1 grandson living with her   Social Determinants of Radio broadcast assistant Strain: Low Risk    Difficulty of Paying Living Expenses: Not hard at all  Food Insecurity: No Food Insecurity   Worried About Charity fundraiser in the Last Year: Never true   Arboriculturist in the Last Year: Never true  Transportation Needs: No Transportation Needs   Lack of Transportation (Medical): No   Lack of Transportation (Non-Medical): No  Physical Activity: Inactive   Days of Exercise per Week: 0 days   Minutes of Exercise per Session: 0 min  Stress: Stress Concern Present   Feeling of Stress : Rather much  Social Connections: Moderately Isolated   Frequency of Communication with Friends and Family: More than three times a week   Frequency of Social Gatherings with Friends and Family: More than three times a week   Attends  Religious Services: More than 4 times per year   Active Member of [employer] or Organizations: No   Attends Music therapist: Never   Marital Status: Divorced     Family History: The patient's family history includes Cancer in her father; Diabetes in her brother; Heart attack in her brother and father; Heart disease in her father; Hypertension in her daughter; Kidney disease in her mother; Other in her mother; Prostate cancer in her father. There is no history of Kidney cancer, Bladder Cancer, Colon cancer, Colon polyps, or Rectal cancer.  ROS:   Please see the history of present illness.     All other systems reviewed  and are negative.  EKGs/Labs/Other Studies Reviewed:    The following studies were reviewed today:   EKG:  EKG not ordered today.    Recent Labs: 01/21/2021: ALT 14 03/15/2021: BUN 36; Creatinine, Ser 1.74; Hemoglobin 12.2; Platelets 226; Potassium 4.3; Sodium 138  Recent Lipid Panel    Component Value Date/Time   CHOL 137 01/29/2020 1515   CHOL 191 11/10/2015 1211   CHOL 144 07/03/2014 0526   TRIG 111 01/29/2020 1515   TRIG 320 (H) 07/03/2014 0526   HDL 45 (L) 01/29/2020 1515   HDL 46 11/10/2015 1211   HDL 33 (L) 07/03/2014 0526   CHOLHDL 3.0 01/29/2020 1515   VLDL 37 (H) 11/10/2016 1531   VLDL 64 (H) 07/03/2014 0526   LDLCALC 73 01/29/2020 1515   LDLCALC 47 07/03/2014 0526     Risk Assessment/Calculations:          Physical Exam:    VS:  BP 120/60 (BP Location: Right Arm, Patient Position: Sitting, Cuff Size: Normal)   Pulse 94   Ht 5\' 7"  (1.702 m)   Wt 141 lb (64 kg)   SpO2 97%   BMI 22.08 kg/m     Wt Readings from Last 3 Encounters:  03/18/21 141 lb (64 kg)  03/15/21 145 lb (65.8 kg)  02/17/21 143 lb (64.9 kg)     GEN:  Well nourished, well developed in no acute distress HEENT: Normal NECK: No JVD; No carotid bruits LYMPHATICS: No lymphadenopathy CARDIAC: RRR, no murmurs, rubs, gallops RESPIRATORY: Rhonchorous breath  sounds, inspiratory wheezing noted at right lung base ABDOMEN: Soft, non-tender, non-distended MUSCULOSKELETAL:  No edema; No deformity  SKIN: Warm and dry NEUROLOGIC:  Alert and oriented x 3 PSYCHIATRIC:  Normal affect   ASSESSMENT:    1. Precordial pain   2. Coronary artery disease involving native coronary artery of native heart, unspecified whether angina present   3. Pure hypercholesterolemia   4. Mixed hyperlipidemia     PLAN:    In order of problems listed above:  Left-sided chest pressure, diagnosed with pneumonia, chest pressure improving with antibiotics.  Patient advised to finish antibiotic course.  If symptoms persist after antibiotics course, will consider left heart cath.  Left heart cath discussed with patient if needed and patient willing to proceed.  CT showed heavily calcified RCA and LAD.  She is high risk for coronary event. Coronary artery calcifications, chest pain, shortness of breath with exertion.  Echo with preserved ejection fraction.  Myoview in 2020 with no significant ischemia.  Continue aspirin, Lipitor  Hyperlipidemia, increase Lipitor to 40 mg daily.  Repeat fasting lipid profile in 1 week.  Follow-up in 2 months   Shared Decision Making/Informed Consent The risks [stroke (1 in 1000), death (1 in 1000), kidney failure [usually temporary] (1 in 500), bleeding (1 in 200), allergic reaction [possibly serious] (1 in 200)], benefits (diagnostic support and management of coronary artery disease) and alternatives of a cardiac catheterization were discussed in detail with Ms. Bark and she is willing to proceed.     Medication Adjustments/Labs and Tests Ordered: Current medicines are reviewed at length with the patient today.  Concerns regarding medicines are outlined above.  No orders of the defined types were placed in this encounter.   Meds ordered this encounter  Medications   atorvastatin (LIPITOR) 40 MG tablet    Sig: Take 1 tablet (40 mg total)  by mouth at bedtime.    Dispense:  90 tablet    Refill:  3  Patient Instructions  Medication Instructions:   Your physician has recommended you make the following change in your medication:    INCREASE your Lipitor to 40 MG once a day.   *If you need a refill on your cardiac medications before your next appointment, please call your pharmacy*   Lab Work:  Your physician recommends that you return for a FASTING lipid profile: Next week sometime  - You will need to be fasting. Please do not have anything to eat or drink after midnight the morning you have the lab work. You may only have water or black coffee with no cream or sugar.   Please return to our office on _________________at____________________    Testing/Procedures:  None ordered   Follow-Up: At [facility], you and your health needs are our priority.  As part of our continuing mission to provide you with exceptional heart care, we have created designated Provider Care Teams.  These Care Teams include your primary Cardiologist (physician) and Advanced Practice Providers (APPs -  Physician Assistants and Nurse Practitioners) who all work together to provide you with the care you need, when you need it.  We recommend signing up for the patient portal called "MyChart".  Sign up information is provided on this After Visit Summary.  MyChart is used to connect with patients for Virtual Visits (Telemedicine).  Patients are able to view lab/test results, encounter notes, upcoming appointments, etc.  Non-urgent messages can be sent to your provider as well.   To learn more about what you can do with MyChart, go to [email or web address].    Your next appointment:   Keep follow up appointment   The format for your next appointment:   In person  Provider:   You may see Kate Sable, MD or one of the following Advanced Practice Providers on your designated Care Team:   Murray Hodgkins, NP Christell Faith,  PA-C Marrianne Mood, PA-C Cadence [place], [place]   Other Instructions     Signed, Kate Sable, MD  03/18/2021 12:17 PM    [place]

## 2021-03-23 ENCOUNTER — Other Ambulatory Visit (INDEPENDENT_AMBULATORY_CARE_PROVIDER_SITE_OTHER): Payer: Medicare Other

## 2021-03-23 ENCOUNTER — Other Ambulatory Visit: Payer: Self-pay

## 2021-03-23 DIAGNOSIS — E7800 Pure hypercholesterolemia, unspecified: Secondary | ICD-10-CM

## 2021-03-23 DIAGNOSIS — E782 Mixed hyperlipidemia: Secondary | ICD-10-CM | POA: Diagnosis not present

## 2021-03-24 ENCOUNTER — Encounter: Payer: Self-pay | Admitting: Internal Medicine

## 2021-03-24 ENCOUNTER — Ambulatory Visit (INDEPENDENT_AMBULATORY_CARE_PROVIDER_SITE_OTHER): Payer: Medicare Other | Admitting: Internal Medicine

## 2021-03-24 VITALS — BP 116/78 | HR 95 | Temp 97.5°F | Resp 16 | Ht 62.0 in | Wt 144.1 lb

## 2021-03-24 DIAGNOSIS — B3731 Acute candidiasis of vulva and vagina: Secondary | ICD-10-CM

## 2021-03-24 DIAGNOSIS — J189 Pneumonia, unspecified organism: Secondary | ICD-10-CM

## 2021-03-24 DIAGNOSIS — B370 Candidal stomatitis: Secondary | ICD-10-CM

## 2021-03-24 LAB — LIPID PANEL
Chol/HDL Ratio: 2.5 ratio (ref 0.0–4.4)
Cholesterol, Total: 113 mg/dL (ref 100–199)
HDL: 45 mg/dL (ref >39)
LDL Chol Calc (NIH): 50 mg/dL (ref 0–99)
Triglycerides: 93 mg/dL (ref 0–149)
VLDL Cholesterol Cal: 18 mg/dL (ref 5–40)

## 2021-03-24 MED ORDER — FLUCONAZOLE 150 MG PO TABS: 150.0000 mg | ORAL_TABLET | Freq: Once | ORAL | 0 refills | Status: AC

## 2021-03-24 MED ORDER — NYSTATIN 100000 UNIT/ML MT SUSP: 5.0000 mL | Freq: Four times a day (QID) | OROMUCOSAL | 0 refills | Status: DC

## 2021-03-24 NOTE — Patient Instructions (Signed)
It was great seeing you today!  Plan discussed at today's visit: -Yeast suspension and pill sent to pharmacy (take 1 pill at if symptoms don't resolve, take another in a few days)  Oral Thrush, Adult Oral thrush, also called oral candidiasis, is a fungal infection that develops in the mouth and throat and on the tongue. It causes white patches to form in the mouth and on the tongue. Many cases of thrush are mild, but this infection can also be serious. [name] can be a repeated (recurrent) problem for certain people who have a weak body defense system (immune system). The weakness can be caused by chronic illnesses, or by taking medicines that limit the body's ability to fight infection. If a person has difficulty fighting infection, the fungus that causes thrush can spread through the body. This can cause life-threatening blood or organ infections. What are the causes? This condition is caused by a fungus (yeast) called Candida albicans. This fungus is normally present in small amounts in the mouth and on other mucous membranes. It usually causes no harm. If conditions are present that allow the fungus to grow without control, it invades surrounding tissues and becomes an infection. Other Candida species can also lead to thrush, though this is rare. What increases the risk? The following factors may make you more likely to develop this condition: Having a weakened immune system. Being an older adult. Having diabetes, cancer, or HIV (human immunodeficiency virus). Having dry mouth (xerostomia). Being pregnant or breastfeeding. Having poor dental care, especially in those who have dentures. Using antibiotic or steroid medicines. What are the signs or symptoms? Symptoms of this condition can vary from mild and moderate to severe and persistent. Symptoms may include: A burning feeling in the mouth and throat. This can occur at the start of a thrush infection. White patches that stick to the  mouth and tongue. The tissue around the patches may be red, raw, and painful. If rubbed (during tooth brushing, for example), the patches and the tissue of the mouth may bleed easily. A bad taste in the mouth or difficulty tasting foods. A cottony feeling in the mouth. Pain during eating and swallowing. Poor appetite. Cracking at the corners of the mouth. How is this diagnosed? This condition is diagnosed based on: A physical exam. Your medical history. How is this treated? This condition is treated with medicines called antifungals, which prevent the growth of fungi. These medicines are either applied directly to the affected area (topical) or swallowed (oral). The treatment will depend on the severity of the condition. Mild cases of thrush may be treated with an antifungal mouth rinse or lozenges. Treatment usually lasts about 14 days. Moderate to severe cases of thrush can be treated with oral antifungal medicine, if they have spread to the esophagus. A topical antifungal medicine may also be used. For some severe infections, treatment may need to continue for more than 14 days. Oral antifungal medicines are rarely used during pregnancy because they may be harmful to the unborn child. If you are pregnant, talk with your health care provider about options for treatment. Persistent or recurrent thrush. For cases of thrush that do not go away or keep coming back: Treatment may be needed twice as long as the symptoms last. Treatment will include both oral and topical antifungal medicines. People with a weakened immune system can take an antifungal medicine on a continuous basis to prevent thrush infections. It is important to treat conditions that make a person more  likely to get thrush, such as diabetes or HIV. Follow these instructions at home: Medicines Take or use over-the-counter and prescription medicines only as told by your health care provider. Talk with your health care provider about  an over-the-counter medicine called gentian violet, which kills bacteria and fungi. Relieving soreness and discomfort To help reduce the discomfort of thrush: Drink cold liquids such as water or iced tea. Try flavored ice treats or frozen juices. Eat foods that are easy to swallow, such as gelatin, ice cream, or custard. Try drinking from a straw if the patches in your mouth are painful.   General instructions Eat plain, unflavored yogurt as directed by your health care provider. Check the label to make sure the yogurt contains live cultures. This yogurt can help healthy bacteria grow in the mouth and can stop the growth of the fungus that causes thrush. If you wear dentures, remove the dentures before going to bed, brush them vigorously, and soak them in a cleaning solution as directed by your health care provider. Rinse your mouth with a warm salt-water mixture several times a day. To make a salt-water mixture, dissolve -1 tsp (3-6 g) of salt in 1 cup (237 mL) of warm water. Contact a health care provider if: Your symptoms are getting worse or are not improving within 7 days of starting treatment. You have symptoms of a spreading infection, such as white patches on the skin outside of the mouth. You are breastfeeding your baby and you have redness and pain in the nipples. Summary Oral thrush, also called oral candidiasis, is a fungal infection that develops in the mouth and throat and on the tongue. It causes white patches to form in the mouth and on the tongue. You are more likely to get this condition if you have a weakened immune system or an underlying condition, such as HIV, cancer, or diabetes. This condition is treated with medicines called antifungals, which prevent the growth of fungi. Contact a health care provider if your symptoms do not improve, or get worse, within 7 days of starting treatment. This information is not intended to replace advice given to you by your health care  provider. Make sure you discuss any questions you have with your health care provider. Document Revised: 03/28/2019 Document Reviewed: 03/28/2019 Elsevier Patient Education  [employer].     Follow up in: as needed  Take care and let us know if you have any questions or concerns prior to your next visit.  Dr. Rosana Berger

## 2021-03-24 NOTE — Progress Notes (Signed)
Acute Office Visit  Subjective:    Patient ID: Christine Burnett, female    DOB: [birth year], 80 y.o.   MRN: [id number]  Chief Complaint  Patient presents with   Christine Burnett    HPI Patient is in today for thrush. Chronic medical conditions include COPD, HTN, CKD, prediabetes. She is currently on Breztri inhaler. She takes this everyday and 98% of the time remembers to rinse her mouth out afterwards. She is also on antibiotics after being diagnosed with PNA at the ER on 03/16/21. She states that on Friday she noticed a terrible burning pain with redness in her vagina which has not been resolving. She denies vaginal discharge or odor. On Monday she then experienced a burning pain on her tongue and noticed it was very red and felt larger than normal. She denies inability to swallow, painful swallow or inability to eat or drink. She denies trouble breathing, cough, fever or shortness of breath. Occasionally wheezes. She has tried nothing for her symptoms, but does note that she's been taking probiotics while on the antibiotics and eats a lot yogurt. She has no other questions or concerns today.    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   Managed by nephrologist   Non-small cell lung cancer (NSCLC) ([place])    Osteoporosis    Oxygen deficiency Me   Risk for falls    Situational disturbance    Stroke ([place]) 2011   tia's x 2   TIA (transient ischemic attack)     Past Surgical History:  Procedure Laterality Date   COLONOSCOPY   2016   ESOPHAGUS SURGERY     INTRAMEDULLARY (IM) NAIL INTERTROCHANTERIC Left 03/26/2017   Procedure: INTRAMEDULLARY (IM) NAIL INTERTROCHANTRIC;  Surgeon: Hessie Knows, MD;  Location: [facility] ORS;  Service: Orthopedics;  Laterality: Left;   VIDEO BRONCHOSCOPY WITH ENDOBRONCHIAL NAVIGATION Right 06/28/2020   Procedure: VIDEO BRONCHOSCOPY WITH ENDOBRONCHIAL NAVIGATION CELLVIZIO;  Surgeon: Tyler Pita, MD;  Location: [facility] ORS;  Service: Pulmonary;  Laterality: Right;    Family History  Problem Relation Age of Onset   Other Mother        Uremeic posioning   Kidney disease Mother    Heart attack Father    Prostate cancer Father    Cancer Father    Heart disease Father    Heart attack Brother    Diabetes Brother    Hypertension Daughter    Kidney cancer Neg Hx    Bladder Cancer Neg Hx    Colon cancer Neg Hx    Colon polyps Neg Hx    Rectal cancer Neg Hx     Social History   Socioeconomic History   Marital status: Divorced    Spouse name: Not on file   Number of children: 4   Years of education: Not on file   Highest education level: 12th grade  Occupational History   Occupation: Retired    Comment: payroll  Tobacco Use  Smoking status: Former    Packs/day: 1.00    Years: 61.00    Pack years: 61.00    Types: Cigarettes    Quit date: 03/25/2017    Years since quitting: 4.0   Smokeless tobacco: Never  Vaping Use   Vaping Use: Never used  Substance and Sexual Activity   Alcohol use: No   Drug use: No   Sexual activity: Not Currently  Other Topics Concern   Not on file  Social History Narrative   Full Code   4 children   Denies alcohol use   Patient has 1 daughters and 1 grandson living with her   Social Determinants of Radio broadcast assistant Strain: Low Risk    Difficulty of Paying Living Expenses: Not hard at all  Food Insecurity: No Food Insecurity   Worried About Charity fundraiser in the Last Year: Never true   Arboriculturist in the Last Year:  Never true  Transportation Needs: No Transportation Needs   Lack of Transportation (Medical): No   Lack of Transportation (Non-Medical): No  Physical Activity: Inactive   Days of Exercise per Week: 0 days   Minutes of Exercise per Session: 0 min  Stress: Stress Concern Present   Feeling of Stress : Rather much  Social Connections: Moderately Isolated   Frequency of Communication with Friends and Family: More than three times a week   Frequency of Social Gatherings with Friends and Family: More than three times a week   Attends Religious Services: More than 4 times per year   Active Member of [employer] or Organizations: No   Attends Archivist Meetings: Never   Marital Status: Divorced  Human resources officer Violence: Not At Risk   Fear of Current or Ex-Partner: No   Emotionally Abused: No   Physically Abused: No   Sexually Abused: No    Outpatient Medications Prior to Visit  Medication Sig Dispense Refill   acetaminophen (TYLENOL) 650 MG CR tablet Take 1 tablet (650 mg total) by mouth 4 (four) times daily as needed for pain.     albuterol (VENTOLIN HFA) 108 (90 Base) MCG/ACT inhaler Inhale 2 puffs into the lungs every 6 (six) hours as needed for wheezing or shortness of breath. 8 g 0   amoxicillin-clavulanate (AUGMENTIN) 875-125 MG tablet Take 1 tablet by mouth 2 (two) times daily for 10 days. 20 tablet 0   aspirin 81 MG EC tablet Take 81 mg by mouth daily. Swallow whole.     atorvastatin (LIPITOR) 40 MG tablet Take 1 tablet (40 mg total) by mouth at bedtime. 90 tablet 3   azithromycin (ZITHROMAX) 250 MG tablet Take 1 a day for 4 days 4 each 0   Budeson-Glycopyrrol-Formoterol (BREZTRI AEROSPHERE) 160-9-4.8 MCG/ACT AERO Inhale 2 puffs into the lungs in the morning and at bedtime. 10.7 g 6   Calcium 200 MG TABS Take 1 tablet by mouth daily.     Cholecalciferol (VITAMIN D-3) 1000 units CAPS Take 2,000 Units by mouth daily.     melatonin 3 MG TABS tablet Take 3 mg by mouth at bedtime.      PARoxetine (PAXIL) 40 MG tablet Take 1 tablet (40 mg total) by mouth every morning. 90 tablet 3   Probiotic Product (PROBIOTIC-10 PO) Take 1 capsule by mouth daily.     Spacer/Aero-Holding Chambers DEVI USE AS DIRECTED 1 each 0   Tiotropium Bromide-Olodaterol (STIOLTO RESPIMAT) 2.5-2.5 MCG/ACT AERS Inhale 2.5 mcg into the lungs 2 (two)  times daily. 4 g 0   No facility-administered medications prior to visit.    Allergies  Allergen Reactions   Cefuroxime Axetil     caused rash.   Ciprofloxacin Nausea Only    Unknown   Codeine Other (See Comments)    Patient does not like the way this makes her feel    Review of Systems  Constitutional:  Negative for appetite change, chills and fever.  HENT:  Negative for mouth sores, sore throat and trouble swallowing.   Respiratory:  Positive for wheezing. Negative for cough and shortness of breath.   Cardiovascular:  Negative for chest pain and palpitations.  Genitourinary:  Positive for vaginal pain. Negative for vaginal bleeding and vaginal discharge.  Skin:  Positive for color change.      Objective:    Physical Exam Constitutional:      Appearance: Normal appearance.  HENT:     Head: Normocephalic and atraumatic.     Mouth/Throat:     Mouth: Mucous membranes are moist.     Pharynx: Posterior oropharyngeal erythema present.  Eyes:     Conjunctiva/sclera: Conjunctivae normal.  Cardiovascular:     Rate and Rhythm: Normal rate and regular rhythm.  Pulmonary:     Effort: Pulmonary effort is normal.     Comments: Wheezes in bilateral upper lobes Musculoskeletal:     Right lower leg: No edema.     Left lower leg: No edema.  Skin:    General: Skin is warm and dry.  Neurological:     General: No focal deficit present.     Mental Status: She is alert. Mental status is at baseline.  Psychiatric:        Mood and Affect: Mood normal.        Behavior: Behavior normal.    BP 116/78   Pulse 95   Temp (!) 97.5 F (36.4 C)   Resp  16   Ht 5\' 2"  (1.575 m)   Wt 144 lb 1.6 oz (65.4 kg)   SpO2 91%   BMI 26.36 kg/m  Wt Readings from Last 3 Encounters:  03/18/21 141 lb (64 kg)  03/15/21 145 lb (65.8 kg)  02/17/21 143 lb (64.9 kg)    Health Maintenance Due  Topic Date Due   COVID-19 Vaccine (1) Never done   Zoster Vaccines- Shingrix (1 of 2) Never done   DEXA SCAN  10/31/2013    There are no preventive care reminders to display for this patient.   Lab Results  Component Value Date   TSH 2.53 08/13/2018   Lab Results  Component Value Date   WBC 7.1 03/15/2021   HGB 12.2 03/15/2021   HCT 36.1 03/15/2021   MCV 100.8 (H) 03/15/2021   PLT 226 03/15/2021   Lab Results  Component Value Date   NA 138 03/15/2021   K 4.3 03/15/2021   CO2 25 03/15/2021   GLUCOSE 131 (H) 03/15/2021   BUN 36 (H) 03/15/2021   CREATININE 1.74 (H) 03/15/2021   BILITOT 0.8 01/21/2021   ALKPHOS 84 01/21/2021   AST 20 01/21/2021   ALT 14 01/21/2021   PROT 7.2 01/21/2021   ALBUMIN 3.8 01/21/2021   CALCIUM 9.1 03/15/2021   ANIONGAP 9 03/15/2021   Lab Results  Component Value Date   CHOL 113 03/23/2021   Lab Results  Component Value Date   HDL 45 03/23/2021   Lab Results  Component Value Date   LDLCALC 50 03/23/2021   Lab Results  Component Value  Date   TRIG 93 03/23/2021   Lab Results  Component Value Date   CHOLHDL 2.5 03/23/2021   Lab Results  Component Value Date   HGBA1C 5.7 (H) 08/13/2018       Assessment & Plan:   1. Oral thrush/Vaginal yeast infection: Nystatin swish and swallow for oral thrush. As she is also complaining of vaginal symptoms, she will be prescribed oral Diflucan to take once, and again in 2 days if symptoms don't resolve. Follow up as needed.   - nystatin (MYCOSTATIN) 100000 UNIT/ML suspension; Take 5 mLs (500,000 Units total) by mouth 4 (four) times daily.  Dispense: 60 mL; Refill: 0 - fluconazole (DIFLUCAN) 150 MG tablet; Take 1 tablet (150 mg total) by mouth once for 1 dose.   Dispense: 3 tablet; Refill: 0  2. Community acquired pneumonia of right lower lobe of lung: Resolving. Still on antibiotics for another 2 days. She will finish antibiotic course and continue to check pulse ox at home, if any concerns arise she will call our office.    Teodora Medici, DO

## 2021-04-12 ENCOUNTER — Telehealth: Payer: Self-pay | Admitting: Cardiology

## 2021-04-12 DIAGNOSIS — I2510 Atherosclerotic heart disease of native coronary artery without angina pectoris: Secondary | ICD-10-CM

## 2021-04-12 NOTE — Telephone Encounter (Signed)
Spoke to patient and she stated that she is ready for the Left heart cath procedure. Per Dr. Thereasa Solo OV note on 03/18/21 he documented that patient might be able to be scheduled when ready if symptoms persisted.  Left-sided chest pressure, diagnosed with pneumonia, chest pressure improving with antibiotics.  Patient advised to finish antibiotic course.  If symptoms persist after antibiotics course, will consider left heart cath.  Left heart cath discussed with patient if needed and patient willing to proceed.  CT showed heavily calcified RCA and LAD.  She is high risk for coronary event.  Will speak with Dr. Garen Lah in person in office tomorrow morning (04/13/21) and confirm if we can schedule her for this Friday (04/15/21) as there is room on Dr. Tyrell Antonio schedule, and patient stated that she would be available. I informed patient that I would call her back tomorrow morning. Patient was grateful for the call back, and agreed with plan.

## 2021-04-12 NOTE — Telephone Encounter (Signed)
Pt c/o of Chest Pain: STAT if CP now or developed within 24 hours  1. Are you having CP right now? No, just "aches"  2. Are you experiencing any other symptoms (ex. SOB, nausea, vomiting, sweating)? SOB, but always has that  3. How long have you been experiencing CP? For a couple days  4. Is your CP continuous or coming and going? Comes and goes  5. Have you taken Nitroglycerin? no ?

## 2021-04-13 ENCOUNTER — Other Ambulatory Visit: Payer: Self-pay | Admitting: Cardiology

## 2021-04-13 ENCOUNTER — Other Ambulatory Visit: Payer: Self-pay

## 2021-04-13 ENCOUNTER — Other Ambulatory Visit (INDEPENDENT_AMBULATORY_CARE_PROVIDER_SITE_OTHER): Payer: Medicare Other

## 2021-04-13 DIAGNOSIS — I2510 Atherosclerotic heart disease of native coronary artery without angina pectoris: Secondary | ICD-10-CM

## 2021-04-13 DIAGNOSIS — R072 Precordial pain: Secondary | ICD-10-CM

## 2021-04-13 NOTE — Telephone Encounter (Signed)
Per Dr. Garen Lah, I scheduled patients cath for Friday 04/15/21. Called patient and informed her of this. Patient is coming into the office for lab work today.Reviewed all instructions with her and sent her a copy of them to her MyChart. Patient was very grateful for the follow up.

## 2021-04-14 LAB — CBC
Hematocrit: 38 % (ref 34.0–46.6)
Hemoglobin: 12.8 g/dL (ref 11.1–15.9)
MCH: 33 pg (ref 26.6–33.0)
MCHC: 33.7 g/dL (ref 31.5–35.7)
MCV: 98 fL — ABNORMAL HIGH (ref 79–97)
Platelets: 235 10*3/uL (ref 150–450)
RBC: 3.88 x10E6/uL (ref 3.77–5.28)
RDW: 11.9 % (ref 11.7–15.4)
WBC: 7.1 10*3/uL (ref 3.4–10.8)

## 2021-04-14 LAB — BASIC METABOLIC PANEL
BUN/Creatinine Ratio: 16 (ref 12–28)
BUN: 25 mg/dL (ref 8–27)
CO2: 23 mmol/L (ref 20–29)
Calcium: 9.5 mg/dL (ref 8.7–10.3)
Chloride: 105 mmol/L (ref 96–106)
Creatinine, Ser: 1.59 mg/dL — ABNORMAL HIGH (ref 0.57–1.00)
Glucose: 102 mg/dL — ABNORMAL HIGH (ref 70–99)
Potassium: 4.6 mmol/L (ref 3.5–5.2)
Sodium: 141 mmol/L (ref 134–144)
eGFR: 32 mL/min/{1.73_m2} — ABNORMAL LOW (ref >59)

## 2021-04-14 NOTE — Telephone Encounter (Signed)
Patients scheduled cath time on 04/15/21 will need to be changed from 9:30 am to 11:30 am due to Dr. Tyrell Antonio in clinic schedule.  Called the patient and made her aware of the change. Advised the patient to arrive for check in at the [facility] @ 10:30 am which is 1 hour prior to the procedure. Advised the patient that all other pre-procedure instructions are the same.  Patient verbalized understanding. Patient has no questions or concerns at this time. Patient voiced appreciation for the call.

## 2021-04-15 ENCOUNTER — Encounter: Payer: Self-pay | Admitting: Cardiovascular Disease

## 2021-04-15 ENCOUNTER — Encounter
Admission: RE | Disposition: A | Discharge status: Home / Self Care | Payer: Self-pay | Source: Home / Self Care | Attending: Cardiovascular Disease

## 2021-04-15 ENCOUNTER — Ambulatory Visit
Admission: RE | Admit: 2021-04-15 | Discharge: 2021-04-15 | Disposition: A | Discharge status: Home / Self Care | Payer: Medicare Other | Attending: Cardiovascular Disease | Admitting: Cardiovascular Disease

## 2021-04-15 ENCOUNTER — Other Ambulatory Visit: Payer: Self-pay

## 2021-04-15 DIAGNOSIS — Z8673 Personal history of transient ischemic attack (TIA), and cerebral infarction without residual deficits: Secondary | ICD-10-CM | POA: Insufficient documentation

## 2021-04-15 DIAGNOSIS — J449 Chronic obstructive pulmonary disease, unspecified: Secondary | ICD-10-CM | POA: Insufficient documentation

## 2021-04-15 DIAGNOSIS — I25118 Atherosclerotic heart disease of native coronary artery with other forms of angina pectoris: Secondary | ICD-10-CM | POA: Diagnosis not present

## 2021-04-15 DIAGNOSIS — Z87891 Personal history of nicotine dependence: Secondary | ICD-10-CM | POA: Diagnosis not present

## 2021-04-15 DIAGNOSIS — E7800 Pure hypercholesterolemia, unspecified: Secondary | ICD-10-CM | POA: Insufficient documentation

## 2021-04-15 DIAGNOSIS — I2510 Atherosclerotic heart disease of native coronary artery without angina pectoris: Secondary | ICD-10-CM

## 2021-04-15 DIAGNOSIS — E782 Mixed hyperlipidemia: Secondary | ICD-10-CM | POA: Insufficient documentation

## 2021-04-15 DIAGNOSIS — Z79899 Other long term (current) drug therapy: Secondary | ICD-10-CM | POA: Insufficient documentation

## 2021-04-15 DIAGNOSIS — R072 Precordial pain: Secondary | ICD-10-CM

## 2021-04-15 DIAGNOSIS — F419 Anxiety disorder, unspecified: Secondary | ICD-10-CM | POA: Insufficient documentation

## 2021-04-15 DIAGNOSIS — Z7982 Long term (current) use of aspirin: Secondary | ICD-10-CM | POA: Diagnosis not present

## 2021-04-15 DIAGNOSIS — R9389 Abnormal findings on diagnostic imaging of other specified body structures: Secondary | ICD-10-CM

## 2021-04-15 HISTORY — PX: LEFT HEART CATH AND CORONARY ANGIOGRAPHY: CATH118249

## 2021-04-15 SURGERY — LEFT HEART CATH AND CORONARY ANGIOGRAPHY
Anesthesia: Moderate Sedation

## 2021-04-15 MED ORDER — MIDAZOLAM HCL 2 MG/2ML IJ SOLN
INTRAMUSCULAR | Status: DC | PRN
  Administered 2021-04-15: 1 mg via INTRAVENOUS

## 2021-04-15 MED ORDER — IOHEXOL 350 MG/ML SOLN
INTRAVENOUS | Status: DC | PRN
  Administered 2021-04-15: 24 mL

## 2021-04-15 MED ORDER — SODIUM CHLORIDE 0.9 % WEIGHT BASED INFUSION
1.0000 mL/kg/h | INTRAVENOUS | Status: DC
  Administered 2021-04-15: 1 mL/kg/h via INTRAVENOUS

## 2021-04-15 MED ORDER — SODIUM CHLORIDE 0.9 % IV SOLN: 250.0000 mL | INTRAVENOUS | Status: DC | PRN

## 2021-04-15 MED ORDER — LIDOCAINE HCL (PF) 1 % IJ SOLN
INTRAMUSCULAR | Status: DC | PRN
  Administered 2021-04-15: 2 mL

## 2021-04-15 MED ORDER — SODIUM CHLORIDE 0.9% FLUSH: 3.0000 mL | Freq: Two times a day (BID) | INTRAVENOUS | Status: DC

## 2021-04-15 MED ORDER — SODIUM CHLORIDE 0.9% FLUSH: 3.0000 mL | INTRAVENOUS | Status: DC | PRN

## 2021-04-15 MED ORDER — HEPARIN SODIUM (PORCINE) 1000 UNIT/ML IJ SOLN
INTRAMUSCULAR | Status: AC
  Filled 2021-04-15: qty 1

## 2021-04-15 MED ORDER — SODIUM CHLORIDE 0.9 % WEIGHT BASED INFUSION
3.0000 mL/kg/h | INTRAVENOUS | Status: AC
  Administered 2021-04-15: 3 mL/kg/h via INTRAVENOUS

## 2021-04-15 MED ORDER — ISOSORBIDE MONONITRATE ER 30 MG PO TB24: 30.0000 mg | ORAL_TABLET | Freq: Every day | ORAL | 6 refills | Status: DC

## 2021-04-15 MED ORDER — LIDOCAINE HCL 1 % IJ SOLN
INTRAMUSCULAR | Status: AC
  Filled 2021-04-15: qty 20

## 2021-04-15 MED ORDER — ASPIRIN 81 MG PO CHEW: 81.0000 mg | CHEWABLE_TABLET | ORAL | Status: DC

## 2021-04-15 MED ORDER — SODIUM CHLORIDE 0.9 % IV SOLN: INTRAVENOUS | Status: DC

## 2021-04-15 MED ORDER — HEPARIN (PORCINE) IN NACL 2000-0.9 UNIT/L-% IV SOLN
INTRAVENOUS | Status: DC | PRN
  Administered 2021-04-15: 1000 mL

## 2021-04-15 MED ORDER — HEPARIN (PORCINE) IN NACL 1000-0.9 UT/500ML-% IV SOLN
INTRAVENOUS | Status: AC
  Filled 2021-04-15: qty 1000

## 2021-04-15 MED ORDER — HEPARIN SODIUM (PORCINE) 1000 UNIT/ML IJ SOLN
INTRAMUSCULAR | Status: DC | PRN
  Administered 2021-04-15: 3500 [IU] via INTRAVENOUS

## 2021-04-15 MED ORDER — VERAPAMIL HCL 2.5 MG/ML IV SOLN
INTRAVENOUS | Status: DC | PRN
  Administered 2021-04-15: 2.5 mg via INTRA_ARTERIAL

## 2021-04-15 MED ORDER — FENTANYL CITRATE (PF) 100 MCG/2ML IJ SOLN
INTRAMUSCULAR | Status: AC
  Filled 2021-04-15: qty 2

## 2021-04-15 MED ORDER — VERAPAMIL HCL 2.5 MG/ML IV SOLN
INTRAVENOUS | Status: AC
  Filled 2021-04-15: qty 2

## 2021-04-15 MED ORDER — ONDANSETRON HCL 4 MG/2ML IJ SOLN: 4.0000 mg | Freq: Four times a day (QID) | INTRAMUSCULAR | Status: DC | PRN

## 2021-04-15 MED ORDER — MIDAZOLAM HCL 2 MG/2ML IJ SOLN
INTRAMUSCULAR | Status: AC
  Filled 2021-04-15: qty 2

## 2021-04-15 MED ORDER — FENTANYL CITRATE (PF) 100 MCG/2ML IJ SOLN
INTRAMUSCULAR | Status: DC | PRN
  Administered 2021-04-15: 25 ug via INTRAVENOUS

## 2021-04-15 MED ORDER — ACETAMINOPHEN 325 MG PO TABS: 650.0000 mg | ORAL_TABLET | ORAL | Status: DC | PRN

## 2021-04-15 SURGICAL SUPPLY — 11 items
CATH INFINITI 5FR JK (CATHETERS) ×2 IMPLANT
DEVICE RAD TR BAND REGULAR (VASCULAR PRODUCTS) ×2 IMPLANT
DRAPE BRACHIAL (DRAPES) ×2 IMPLANT
GLIDESHEATH SLEND SS 6F .021 (SHEATH) ×2 IMPLANT
GUIDEWIRE INQWIRE 1.5J.035X260 (WIRE) ×1 IMPLANT
INQWIRE 1.5J .035X260CM (WIRE) ×2
PACK CARDIAC CATH (CUSTOM PROCEDURE TRAY) ×2 IMPLANT
PROTECTION STATION PRESSURIZED (MISCELLANEOUS) ×2
SET ATX SIMPLICITY (MISCELLANEOUS) ×2 IMPLANT
STATION PROTECTION PRESSURIZED (MISCELLANEOUS) ×1 IMPLANT
WIRE HITORQ VERSACORE ST 145CM (WIRE) ×2 IMPLANT

## 2021-04-15 NOTE — Interval H&P Note (Signed)
Cath Lab Visit (complete for each Cath Lab visit)  Clinical Evaluation Leading to the Procedure:   ACS: No.  Non-ACS:    Anginal Classification: CCS III  Anti-ischemic medical therapy: No Therapy  Non-Invasive Test Results: No non-invasive testing performed  Prior CABG: No previous CABG      History and Physical Interval Note:  04/15/2021 12:05 PM  Christine Burnett  has presented today for surgery, with the diagnosis of LT Heart Cath   Abnormal CTA.  The various methods of treatment have been discussed with the patient and family. After consideration of risks, benefits and other options for treatment, the patient has consented to  Procedure(s): LEFT HEART CATH AND CORONARY ANGIOGRAPHY (N/A) as a surgical intervention.  The patient's history has been reviewed, patient examined, no change in status, stable for surgery.  I have reviewed the patient's chart and labs.  Questions were answered to the patient's satisfaction.     [name]

## 2021-04-18 ENCOUNTER — Encounter: Payer: Self-pay | Admitting: Cardiovascular Disease

## 2021-05-19 ENCOUNTER — Ambulatory Visit: Payer: Medicare Other | Admitting: Cardiology

## 2021-06-09 DIAGNOSIS — N2581 Secondary hyperparathyroidism of renal origin: Secondary | ICD-10-CM | POA: Diagnosis not present

## 2021-06-09 DIAGNOSIS — N184 Chronic kidney disease, stage 4 (severe): Secondary | ICD-10-CM | POA: Diagnosis not present

## 2021-06-09 DIAGNOSIS — I959 Hypotension, unspecified: Secondary | ICD-10-CM | POA: Diagnosis not present

## 2021-07-04 ENCOUNTER — Encounter: Payer: Self-pay | Admitting: Cardiology

## 2021-07-04 ENCOUNTER — Other Ambulatory Visit: Payer: Self-pay

## 2021-07-04 ENCOUNTER — Ambulatory Visit (INDEPENDENT_AMBULATORY_CARE_PROVIDER_SITE_OTHER): Payer: Medicare Other | Admitting: Cardiology

## 2021-07-04 VITALS — BP 87/64 | HR 108 | Ht 67.0 in | Wt 146.0 lb

## 2021-07-04 DIAGNOSIS — I2510 Atherosclerotic heart disease of native coronary artery without angina pectoris: Secondary | ICD-10-CM

## 2021-07-04 DIAGNOSIS — E782 Mixed hyperlipidemia: Secondary | ICD-10-CM | POA: Diagnosis not present

## 2021-07-04 DIAGNOSIS — R42 Dizziness and giddiness: Secondary | ICD-10-CM | POA: Diagnosis not present

## 2021-07-04 MED ORDER — ISOSORBIDE MONONITRATE ER 30 MG PO TB24: 15.0000 mg | ORAL_TABLET | Freq: Every day | ORAL | 6 refills | Status: DC

## 2021-07-04 NOTE — Patient Instructions (Signed)
Medication Instructions:  DECREASE isosorbide (Imdur) to 15mg  daily.   *If you need a refill on your cardiac medications before your next appointment, please call your pharmacy*   Lab Work: None ordered  If you have labs (blood work) drawn today and your tests are completely normal, you will receive your results only by: [place] (if you have MyChart) OR A paper copy in the mail If you have any lab test that is abnormal or we need to change your treatment, we will call you to review the results.   Testing/Procedures: None ordered  Follow-Up: At [facility], you and your health needs are our priority.  As part of our continuing mission to provide you with exceptional heart care, we have created designated Provider Care Teams.  These Care Teams include your primary Cardiologist (physician) and Advanced Practice Providers (APPs -  Physician Assistants and Nurse Practitioners) who all work together to provide you with the care you need, when you need it.  We recommend signing up for the patient portal called "MyChart".  Sign up information is provided on this After Visit Summary.  MyChart is used to connect with patients for Virtual Visits (Telemedicine).  Patients are able to view lab/test results, encounter notes, upcoming appointments, etc.  Non-urgent messages can be sent to your provider as well.   To learn more about what you can do with MyChart, go to [email or web address].    Your next appointment:   6 month(s)  The format for your next appointment:   In Person  Provider:   You may see Kate Sable, MD or one of the following Advanced Practice Providers on your designated Care Team:   Murray Hodgkins, NP Christell Faith, PA-C Cadence Kathlen Mody, [place]

## 2021-07-04 NOTE — Progress Notes (Signed)
Cardiology Office Note:    Date:  07/04/2021   ID:  Christine Burnett, Christine Burnett 1940-08-16, MRN [id number]  PCP:  Delsa Grana, PA-C   [facility] Providers Cardiologist:  Kate Sable, MD     Referring MD: Delsa Grana, PA-C   No chief complaint on file.   History of Present Illness:    Christine Burnett is a 81 y.o. female with a hx of anxiety, former smoker x50 years, COPD, hyperlipidemia, TIA who presents for follow-up.    Previously seen due to chest pain, chest CT showed coronary calcifications.  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  Imdur was started for antianginal benefit.  She denies any further episodes of chest pain.  States feeling a little dizzy while getting out of her car today.  She otherwise has no other concerns.  Prior notes Echo 01/2021 EF 60 to 65% mild to moderate MR Left heart cath 04/2021, 40% mid LAD, 70% first diagonal, 40% RCA, 50% OM1 Lexiscan Myoview 03/2019 low risk study, no evidence for ischemia.  Past Medical History:  Diagnosis Date   Allergic rhinitis    Anxiety    Arthritis Me   Chronic bronchitis (HCC)    Chronic kidney disease, stage III (moderate) ([place]) 08/31/2017   Seeing nephrologist   CKD (chronic kidney disease), stage III ([place])    COPD (chronic obstructive pulmonary disease) ([place])    Cornea scar    Depression    unspecified   Diverticulosis    Emphysema of lung ([place]) Me   Hx of fracture of left hip 12/24/2017   Hyperlipidemia    Hyperparathyroidism, secondary renal ([place]) 08/31/2017   Managed by nephrologist   Non-small cell lung cancer (NSCLC) ([place])    Osteoporosis    Oxygen deficiency Me   Risk for falls    Situational disturbance    Stroke ([place]) 2011   tia's x 2   TIA (transient ischemic attack)     Past Surgical History:  Procedure Laterality Date   COLONOSCOPY  2016   ESOPHAGUS SURGERY     INTRAMEDULLARY (IM) NAIL INTERTROCHANTERIC Left 03/26/2017   Procedure: INTRAMEDULLARY (IM) NAIL  INTERTROCHANTRIC;  Surgeon: Hessie Knows, MD;  Location: [facility];  Service: Orthopedics;  Laterality: Left;   LEFT HEART CATH AND CORONARY ANGIOGRAPHY N/A 04/15/2021   Procedure: LEFT HEART CATH AND CORONARY ANGIOGRAPHY;  Surgeon: Wellington Hampshire, MD;  Location: [facility];  Service: Cardiovascular;  Laterality: N/A;   VIDEO BRONCHOSCOPY WITH ENDOBRONCHIAL NAVIGATION Right 06/28/2020   Procedure: VIDEO BRONCHOSCOPY WITH ENDOBRONCHIAL NAVIGATION CELLVIZIO;  Surgeon: Tyler Pita, MD;  Location: [facility];  Service: Pulmonary;  Laterality: Right;    Current Medications: Current Meds  Medication Sig   acetaminophen (TYLENOL) 650 MG CR tablet Take 1 tablet (650 mg total) by mouth 4 (four) times daily as needed for pain.   albuterol (VENTOLIN HFA) 108 (90 Base) MCG/ACT inhaler Inhale 2 puffs into the lungs every 6 (six) hours as needed for wheezing or shortness of breath.   aspirin 81 MG EC tablet Take 81 mg by mouth at bedtime. Swallow whole.   atorvastatin (LIPITOR) 40 MG tablet Take 1 tablet (40 mg total) by mouth at bedtime.   Budeson-Glycopyrrol-Formoterol (BREZTRI AEROSPHERE) 160-9-4.8 MCG/ACT AERO Inhale 2 puffs into the lungs in the morning and at bedtime. (Patient taking differently: Inhale 2 puffs into the lungs 2 (two) times daily as needed (respiratory issues.).)   Calcium 200 MG TABS Take 300 mg by mouth every  Monday, Tuesday, Wednesday, Thursday, and Friday.   Cholecalciferol (VITAMIN D3) 50 MCG (2000 UT) TABS Take 2,000 Units by mouth at bedtime.   melatonin 3 MG TABS tablet Take 3 mg by mouth at bedtime.   PARoxetine (PAXIL) 40 MG tablet Take 1 tablet (40 mg total) by mouth every morning.   Probiotic Product (PROBIOTIC PO) Take 1 capsule by mouth at bedtime.   Spacer/Aero-Holding Chambers DEVI USE AS DIRECTED   [DISCONTINUED] isosorbide mononitrate (IMDUR) 30 MG 24 hr tablet Take 1 tablet (30 mg total) by mouth daily.     Allergies:   Cefuroxime axetil,  Ciprofloxacin, and Codeine   Social History   Socioeconomic History   Marital status: Divorced    Spouse name: Not on file   Number of children: 4   Years of education: Not on file   Highest education level: 12th grade  Occupational History   Occupation: Retired    Comment: payroll  Tobacco Use   Smoking status: Former    Packs/day: 1.00    Years: 61.00    Pack years: 61.00    Types: Cigarettes    Quit date: 03/25/2017    Years since quitting: 4.2   Smokeless tobacco: Never  Vaping Use   Vaping Use: Never used  Substance and Sexual Activity   Alcohol use: No   Drug use: No   Sexual activity: Not Currently  Other Topics Concern   Not on file  Social History Narrative   Full Code   4 children   Denies alcohol use   Patient has 1 daughters and 1 grandson living with her   Social Determinants of Radio broadcast assistant Strain: Low Risk    Difficulty of Paying Living Expenses: Not hard at all  Food Insecurity: No Food Insecurity   Worried About Charity fundraiser in the Last Year: Never true   Arboriculturist in the Last Year: Never true  Transportation Needs: No Transportation Needs   Lack of Transportation (Medical): No   Lack of Transportation (Non-Medical): No  Physical Activity: Inactive   Days of Exercise per Week: 0 days   Minutes of Exercise per Session: 0 min  Stress: Stress Concern Present   Feeling of Stress : Rather much  Social Connections: Moderately Isolated   Frequency of Communication with Friends and Family: More than three times a week   Frequency of Social Gatherings with Friends and Family: More than three times a week   Attends Religious Services: More than 4 times per year   Active Member of [employer] or Organizations: No   Attends Music therapist: Never   Marital Status: Divorced     Family History: The patient's family history includes Cancer in her father; Diabetes in her brother; Heart attack in her brother and father;  Heart disease in her father; Hypertension in her daughter; Kidney disease in her mother; Other in her mother; Prostate cancer in her father. There is no history of Kidney cancer, Bladder Cancer, Colon cancer, Colon polyps, or Rectal cancer.  ROS:   Please see the history of present illness.     All other systems reviewed and are negative.  EKGs/Labs/Other Studies Reviewed:    The following studies were reviewed today:   EKG:  EKG not ordered today.    Recent Labs: 01/21/2021: ALT 14 04/13/2021: BUN 25; Creatinine, Ser 1.59; Hemoglobin 12.8; Platelets 235; Potassium 4.6; Sodium 141  Recent Lipid Panel    Component Value Date/Time  CHOL 113 03/23/2021 1012   CHOL 144 07/03/2014 0526   TRIG 93 03/23/2021 1012   TRIG 320 (H) 07/03/2014 0526   HDL 45 03/23/2021 1012   HDL 33 (L) 07/03/2014 0526   CHOLHDL 2.5 03/23/2021 1012   CHOLHDL 3.0 01/29/2020 1515   VLDL 37 (H) 11/10/2016 1531   VLDL 64 (H) 07/03/2014 0526   LDLCALC 50 03/23/2021 1012   LDLCALC 73 01/29/2020 1515   LDLCALC 47 07/03/2014 0526     Risk Assessment/Calculations:          Physical Exam:    VS:  BP (!) 87/64    Pulse (!) 108    Ht 5\' 7"  (1.702 m)    Wt 146 lb (66.2 kg)    SpO2 95%    BMI 22.87 kg/m     Wt Readings from Last 3 Encounters:  07/04/21 146 lb (66.2 kg)  04/15/21 145 lb (65.8 kg)  03/24/21 144 lb 1.6 oz (65.4 kg)     GEN:  Well nourished, well developed in no acute distress HEENT: Normal NECK: No JVD; No carotid bruits LYMPHATICS: No lymphadenopathy CARDIAC: RRR, no murmurs, rubs, gallops RESPIRATORY: Rhonchorous breath sounds, inspiratory wheezing noted at right lung base ABDOMEN: Soft, non-tender, non-distended MUSCULOSKELETAL:  No edema; No deformity  SKIN: Warm and dry NEUROLOGIC:  Alert and oriented x 3 PSYCHIATRIC:  Normal affect   ASSESSMENT:    1. Coronary artery disease involving native coronary artery of native heart, unspecified whether angina present   2. Mixed  hyperlipidemia   3. Dizziness     PLAN:    In order of problems listed above:  Nonobstructive CAD, moderate to severe coronary calcification.  40% mid LAD, 70% first diagonal, 40% RCA, 50% OM1.  Denies chest pain.  Continue aspirin 81 mg, Lipitor 40.  Imdur 15 mg daily for antianginal benefit.  Echo with preserved ejection fraction, EF 60 to 65% Hyperlipidemia, LDL at goal, continue Lipitor 40 mg daily. Dizziness, slightly hypotensive with systolic in the 41O.  Reduce Imdur to 15 mg daily.  Follow-up in 6 months    Medication Adjustments/Labs and Tests Ordered: Current medicines are reviewed at length with the patient today.  Concerns regarding medicines are outlined above.  No orders of the defined types were placed in this encounter.    Meds ordered this encounter  Medications   isosorbide mononitrate (IMDUR) 30 MG 24 hr tablet    Sig: Take 0.5 tablets (15 mg total) by mouth daily.    Dispense:  30 tablet    Refill:  6      Patient Instructions  Medication Instructions:  DECREASE isosorbide (Imdur) to 15mg  daily.   *If you need a refill on your cardiac medications before your next appointment, please call your pharmacy*   Lab Work: None ordered  If you have labs (blood work) drawn today and your tests are completely normal, you will receive your results only by: [place] (if you have MyChart) OR A paper copy in the mail If you have any lab test that is abnormal or we need to change your treatment, we will call you to review the results.   Testing/Procedures: None ordered  Follow-Up: At [facility], you and your health needs are our priority.  As part of our continuing mission to provide you with exceptional heart care, we have created designated Provider Care Teams.  These Care Teams include your primary Cardiologist (physician) and Advanced Practice Providers (APPs -  Physician Assistants and Nurse  Practitioners) who all work together to provide you  with the care you need, when you need it.  We recommend signing up for the patient portal called "MyChart".  Sign up information is provided on this After Visit Summary.  MyChart is used to connect with patients for Virtual Visits (Telemedicine).  Patients are able to view lab/test results, encounter notes, upcoming appointments, etc.  Non-urgent messages can be sent to your provider as well.   To learn more about what you can do with MyChart, go to [email or web address].    Your next appointment:   6 month(s)  The format for your next appointment:   In Person  Provider:   You may see Kate Sable, MD or one of the following Advanced Practice Providers on your designated Care Team:   Murray Hodgkins, NP Christell Faith, PA-C Cadence Kathlen Mody, [place]    Signed, Kate Sable, MD  07/04/2021 5:20 PM    [place]

## 2021-07-19 ENCOUNTER — Ambulatory Visit
Admission: RE | Admit: 2021-07-19 | Discharge: 2021-07-19 | Disposition: A | Discharge status: Home / Self Care | Payer: Medicare Other | Source: Ambulatory Visit | Attending: Oncology | Admitting: Oncology

## 2021-07-19 ENCOUNTER — Other Ambulatory Visit: Payer: Self-pay

## 2021-07-19 DIAGNOSIS — Z08 Encounter for follow-up examination after completed treatment for malignant neoplasm: Secondary | ICD-10-CM | POA: Diagnosis not present

## 2021-07-19 DIAGNOSIS — J439 Emphysema, unspecified: Secondary | ICD-10-CM | POA: Diagnosis not present

## 2021-07-19 DIAGNOSIS — C3490 Malignant neoplasm of unspecified part of unspecified bronchus or lung: Secondary | ICD-10-CM | POA: Diagnosis not present

## 2021-07-19 DIAGNOSIS — Z85118 Personal history of other malignant neoplasm of bronchus and lung: Secondary | ICD-10-CM | POA: Diagnosis not present

## 2021-07-19 DIAGNOSIS — J9811 Atelectasis: Secondary | ICD-10-CM | POA: Diagnosis not present

## 2021-07-19 DIAGNOSIS — I700 Atherosclerosis of aorta: Secondary | ICD-10-CM | POA: Diagnosis not present

## 2021-07-19 DIAGNOSIS — J479 Bronchiectasis, uncomplicated: Secondary | ICD-10-CM | POA: Diagnosis not present

## 2021-07-22 ENCOUNTER — Other Ambulatory Visit: Payer: Self-pay | Admitting: Oncology

## 2021-07-22 ENCOUNTER — Ambulatory Visit
Admission: RE | Admit: 2021-07-22 | Discharge: 2021-07-22 | Disposition: A | Discharge status: Home / Self Care | Payer: Medicare Other | Source: Ambulatory Visit | Attending: Oncology | Admitting: Oncology

## 2021-07-22 ENCOUNTER — Other Ambulatory Visit: Payer: Self-pay

## 2021-07-22 ENCOUNTER — Ambulatory Visit
Admission: RE | Admit: 2021-07-22 | Discharge: 2021-07-22 | Disposition: A | Discharge status: Home / Self Care | Payer: Medicare Other | Attending: Oncology | Admitting: Oncology

## 2021-07-22 ENCOUNTER — Telehealth: Payer: Self-pay | Admitting: *Deleted

## 2021-07-22 ENCOUNTER — Inpatient Hospital Stay: Payer: Medicare Other | Attending: Oncology | Admitting: Oncology

## 2021-07-22 DIAGNOSIS — Z08 Encounter for follow-up examination after completed treatment for malignant neoplasm: Secondary | ICD-10-CM

## 2021-07-22 DIAGNOSIS — M47812 Spondylosis without myelopathy or radiculopathy, cervical region: Secondary | ICD-10-CM | POA: Diagnosis not present

## 2021-07-22 DIAGNOSIS — J439 Emphysema, unspecified: Secondary | ICD-10-CM | POA: Diagnosis not present

## 2021-07-22 DIAGNOSIS — M47816 Spondylosis without myelopathy or radiculopathy, lumbar region: Secondary | ICD-10-CM | POA: Diagnosis not present

## 2021-07-22 DIAGNOSIS — F3289 Other specified depressive episodes: Secondary | ICD-10-CM

## 2021-07-22 DIAGNOSIS — Z85118 Personal history of other malignant neoplasm of bronchus and lung: Secondary | ICD-10-CM

## 2021-07-22 DIAGNOSIS — C3490 Malignant neoplasm of unspecified part of unspecified bronchus or lung: Secondary | ICD-10-CM | POA: Diagnosis not present

## 2021-07-22 NOTE — Telephone Encounter (Signed)
Call placed to [facility]. A recorded message states front  office will not be available from Today 2/17 through [place] 2/21. I left a message with my phone number enquiring how to make a referral for this patient. Diagnosis of Depression.

## 2021-07-23 ENCOUNTER — Encounter: Payer: Self-pay | Admitting: Oncology

## 2021-07-23 DIAGNOSIS — Z20822 Contact with and (suspected) exposure to covid-19: Secondary | ICD-10-CM | POA: Diagnosis not present

## 2021-07-23 NOTE — Progress Notes (Signed)
I connected with Christine Burnett on 07/23/21 at  2:30 PM EST by video enabled telemedicine visit and verified that I am speaking with the correct person using two identifiers.   I discussed the limitations, risks, security and privacy concerns of performing an evaluation and management service by telemedicine and the availability of in-person appointments. I also discussed with the patient that there may be a patient responsible charge related to this service. The patient expressed understanding and agreed to proceed.  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  It showed a 4 x 3.5 cm lobulated and spiculated mass in the right lower lobe surrounding the right lower lobe bronchial tree.  Associated soft tissue within the bronchial tree which may represent local ingrowth.  This was followed by a bronchoscopy and bronchoalveolar lavage from the right lower lobe was consistent with non-small cell lung cancer.  Neoplastic cells were negative for p40 TTF-1 and CD56.    Further characterization between squamous and adenocarcinoma could not be obtained.   PET CT scan showed a 4.2 cm right lower lobe lung mass with an SUV of 10.  No hypermetabolic hilar or mediastinal adenopathy.  No evidence of distant metastatic disease.  Tiny nodule at the minor fissure of the right chest potential on follow-up.  Mild aneurysmal abdominal aorta recommend follow-up every 3 years.  Patient received SBRT to the lesion sometime in March 2022.  Interval history: Patient is doing well for her age.Has occasional dizziness.  Patient reports new onset back pain for the last 3 to 4 days she reports being under a  lot of mental stress.  Denies any cough or shortness of breath  Review of Systems  Constitutional:  Negative for chills, fever, malaise/fatigue and weight loss.  HENT:  Negative for congestion, ear discharge and nosebleeds.   Eyes:  Negative for blurred vision.  Respiratory:  Negative for cough, hemoptysis, sputum production, shortness of breath and wheezing.   Cardiovascular:  Negative for chest pain, palpitations, orthopnea and claudication.  Gastrointestinal:  Negative for abdominal pain, blood in stool, constipation, diarrhea, heartburn, melena, nausea and vomiting.  Genitourinary:  Negative for dysuria, flank pain, frequency, hematuria and urgency.  Musculoskeletal:  Positive for back pain. Negative for joint pain and myalgias.  Skin:  Negative for rash.  Neurological:  Negative for dizziness, tingling, focal weakness, seizures, weakness and headaches.  Endo/Heme/Allergies:  Does not bruise/bleed easily.  Psychiatric/Behavioral:  Negative for depression and suicidal ideas. The patient does not have insomnia.    Allergies  Allergen Reactions   Cefuroxime Axetil Rash   Ciprofloxacin Nausea Only    Unknown   Codeine Other (See Comments)    Patient does not like how the medication makes you feel     Past Medical History:  Diagnosis Date   Allergic rhinitis    Anxiety    Arthritis Me   Chronic bronchitis (HCC)    Chronic kidney disease, stage III (moderate) ([place]) 08/31/2017   Seeing nephrologist   CKD (chronic kidney disease), stage III (HCC)    COPD (chronic obstructive pulmonary disease) ([place])    Cornea scar    Depression    unspecified   Diverticulosis  Emphysema of lung ([place]) Me   Hx of fracture of left hip 12/24/2017   Hyperlipidemia    Hyperparathyroidism, 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   Managed by nephrologist   Non-small cell lung cancer (NSCLC) ([place])    Osteoporosis    Oxygen deficiency Me   Risk for falls    Situational disturbance    Stroke [facility]) 2011    tia's x 2   TIA (transient ischemic attack)     Past Surgical History:  Procedure Laterality Date   COLONOSCOPY  2016   ESOPHAGUS SURGERY     INTRAMEDULLARY (IM) NAIL INTERTROCHANTERIC Left 03/26/2017   Procedure: INTRAMEDULLARY (IM) NAIL INTERTROCHANTRIC;  Surgeon: Hessie Knows, MD;  Location: [facility] ORS;  Service: Orthopedics;  Laterality: Left;   LEFT HEART CATH AND CORONARY ANGIOGRAPHY N/A 04/15/2021   Procedure: LEFT HEART CATH AND CORONARY ANGIOGRAPHY;  Surgeon: Wellington Hampshire, MD;  Location: [place] [facility];  Service: Cardiovascular;  Laterality: N/A;   VIDEO BRONCHOSCOPY WITH ENDOBRONCHIAL NAVIGATION Right 06/28/2020   Procedure: VIDEO BRONCHOSCOPY WITH ENDOBRONCHIAL NAVIGATION CELLVIZIO;  Surgeon: Tyler Pita, MD;  Location: [facility] ORS;  Service: Pulmonary;  Laterality: Right;    Social History   Socioeconomic History   Marital status: Divorced    Spouse name: Not on file   Number of children: 4   Years of education: Not on file   Highest education level: 12th grade  Occupational History   Occupation: Retired    Comment: payroll  Tobacco Use   Smoking status: Former    Packs/day: 1.00    Years: 61.00    Pack years: 61.00    Types: Cigarettes    Quit date: 03/25/2017    Years since quitting: 4.3   Smokeless tobacco: Never  Vaping Use   Vaping Use: Never used  Substance and Sexual Activity   Alcohol use: No   Drug use: No   Sexual activity: Not Currently  Other Topics Concern   Not on file  Social History Narrative   Full Code   4 children   Denies alcohol use   Patient has 1 daughters and 1 grandson living with her   Social Determinants of Radio broadcast assistant Strain: Low Risk    Difficulty of Paying Living Expenses: Not hard at all  Food Insecurity: No Food Insecurity   Worried About Charity fundraiser in the Last Year: Never true   Arboriculturist in the Last Year: Never true  Transportation Needs: No Transportation Needs    Lack of Transportation (Medical): No   Lack of Transportation (Non-Medical): No  Physical Activity: Inactive   Days of Exercise per Week: 0 days   Minutes of Exercise per Session: 0 min  Stress: Stress Concern Present   Feeling of Stress : Rather much  Social Connections: Moderately Isolated   Frequency of Communication with Friends and Family: More than three times a week   Frequency of Social Gatherings with Friends and Family: More than three times a week   Attends Religious Services: More than 4 times per year   Active Member of [employer] or Organizations: No   Attends Archivist Meetings: Never   Marital Status: Divorced  Human resources officer Violence: Not At Risk   Fear of Current or Ex-Partner: No   Emotionally Abused: No   Physically Abused: No   Sexually Abused: No    Family History  Problem Relation Age of Onset   Other Mother  [place] posioning   Kidney disease Mother    Heart attack Father    Prostate cancer Father    Cancer Father    Heart disease Father    Heart attack Brother    Diabetes Brother    Hypertension Daughter    Kidney cancer Neg Hx    Bladder Cancer Neg Hx    Colon cancer Neg Hx    Colon polyps Neg Hx    Rectal cancer Neg Hx      Current Outpatient Medications:    acetaminophen (TYLENOL) 650 MG CR tablet, Take 1 tablet (650 mg total) by mouth 4 (four) times daily as needed for pain., Disp: , Rfl:    albuterol (VENTOLIN HFA) 108 (90 Base) MCG/ACT inhaler, Inhale 2 puffs into the lungs every 6 (six) hours as needed for wheezing or shortness of breath., Disp: 8 g, Rfl: 0   aspirin 81 MG EC tablet, Take 81 mg by mouth at bedtime. Swallow whole., Disp: , Rfl:    atorvastatin (LIPITOR) 40 MG tablet, Take 1 tablet (40 mg total) by mouth at bedtime., Disp: 90 tablet, Rfl: 3   Budeson-Glycopyrrol-Formoterol (BREZTRI AEROSPHERE) 160-9-4.8 MCG/ACT AERO, Inhale 2 puffs into the lungs in the morning and at bedtime. (Patient taking differently:  Inhale 2 puffs into the lungs 2 (two) times daily as needed (respiratory issues.).), Disp: 10.7 g, Rfl: 6   Calcium 200 MG TABS, Take 300 mg by mouth every Monday, Tuesday, Wednesday, Thursday, and Friday., Disp: , Rfl:    Cholecalciferol (VITAMIN D3) 50 MCG (2000 UT) TABS, Take 2,000 Units by mouth at bedtime., Disp: , Rfl:    isosorbide mononitrate (IMDUR) 30 MG 24 hr tablet, Take 0.5 tablets (15 mg total) by mouth daily., Disp: 30 tablet, Rfl: 6   melatonin 3 MG TABS tablet, Take 3 mg by mouth at bedtime., Disp: , Rfl:    PARoxetine (PAXIL) 40 MG tablet, Take 1 tablet (40 mg total) by mouth every morning., Disp: 90 tablet, Rfl: 3   Probiotic Product (PROBIOTIC PO), Take 1 capsule by mouth at bedtime., Disp: , Rfl:    Spacer/Aero-Holding Chambers DEVI, USE AS DIRECTED, Disp: 1 each, Rfl: 0  CT Chest Wo Contrast  Result Date: 07/20/2021 CLINICAL DATA:  Lung cancer follow-up. History of right lower lobe lung cancer now 5 months status post SBRT. EXAM: CT CHEST WITHOUT CONTRAST TECHNIQUE: Multidetector CT imaging of the chest was performed following the standard protocol without IV contrast. RADIATION DOSE REDUCTION: This exam was performed according to the departmental dose-optimization program which includes automated exposure control, adjustment of the mA and/or kV according to patient size and/or use of iterative reconstruction technique. COMPARISON:  Prior CT scan of the chest 03/16/2021; 01/17/2021; 09/16/2020 FINDINGS: Cardiovascular: Limited evaluation in the absence of intravenous contrast. Conventional 3 vessel arch anatomy. Extensive atherosclerotic vascular calcifications throughout the aorta and coronary arteries. The heart is normal in size. No significant pericardial effusion. No aortic aneurysm. Mediastinum/Nodes: Unremarkable CT appearance of the thyroid gland. No suspicious mediastinal or hilar adenopathy. No soft tissue mediastinal mass. The thoracic esophagus is unremarkable.  Lungs/Pleura: The region of prior neoplasm in the posteromedial aspect of the right lower lobe measures approximately 1.8 x 1.6 cm, considerably smaller than 4.1 x 3.6 cm prior to treatment on 05/21/2020. The tissue remains irregular and scar like in appearance. Persistent extensive opacification and obstruction of the right lower lobe segmental bronchi with associated bandlike right lower lobe atelectasis. Partial obstruction also of the right middle lobe bronchus.  Mild bronchiectasis. Upper Abdomen: Background changes of moderately advanced centrilobular pulmonary emphysema. Biapical pleuroparenchymal scarring. No new nodule or suspicious mass. Musculoskeletal: No acute fracture or aggressive appearing lytic or blastic osseous lesion. Stable chronic L1 compression fracture. IMPRESSION: 1. Similar appearance of post radiation changes to the right lower lobe. No evidence of residual or recurrent malignancy. 2. No evidence of new pulmonary mass or nodule. 3. Similar appearance of now chronic segmental bronchial obstruction in the right lower lobe and partial obstruction of the right middle lobe bronchus with associated bronchiectasis in both the right lower and right middle lobes accompanied by band like areas of chronic atelectasis/scarring. 4. Aortic and coronary artery atherosclerotic vascular calcifications. 5. Moderately severe centrilobular pulmonary emphysema. 6. Stable chronic L1 compression fracture. Aortic Atherosclerosis (ICD10-I70.0) and Emphysema (ICD10-J43.9). Electronically Signed   By: Jacqulynn Cadet M.D.   On: 07/20/2021 07:08   DG SCOLIOSIS EVAL COMPLETE SPINE 2 OR 3 VIEWS  Result Date: 07/22/2021 CLINICAL DATA:  Lung cancer, increasing back and left-sided pain EXAM: DG SCOLIOSIS EVAL COMPLETE SPINE 2-3V COMPARISON:  07/19/2021, 03/16/2021, 07/13/2020 FINDINGS: Frontal and lateral views of the cervical, thoracic, and lumbar spine are obtained. Alignment is grossly anatomic with no evidence of  scoliosis. Chronic compression deformity superior endplate of the L1 vertebral body. No other acute or destructive bony lesions. Mild multilevel cervical spondylosis and facet hypertrophy. Prominent facet hypertrophic changes throughout the lower lumbar spine. Chronic linear consolidation within the right lung base unchanged since prior exam, likely post radiation change. Stable background emphysema. Bowel gas pattern is unremarkable. Postsurgical changes from prior left hip ORIF. IMPRESSION: 1. Spondylosis and facet hypertrophy within the cervical and lumbar spine. 2. Chronic L1 compression deformity, stable. 3. No acute or destructive bony lesions. Electronically Signed   By: Randa Ngo M.D.   On: 07/22/2021 16:28    No images are attached to the encounter.   CMP Latest Ref Rng & Units 04/13/2021  Glucose 70 - 99 mg/dL 102(H)  BUN 8 - 27 mg/dL 25  Creatinine 0.57 - 1.00 mg/dL 1.59(H)  Sodium 134 - 144 mmol/L 141  Potassium 3.5 - 5.2 mmol/L 4.6  Chloride 96 - 106 mmol/L 105  CO2 20 - 29 mmol/L 23  Calcium 8.7 - 10.3 mg/dL 9.5  Total Protein 6.5 - 8.1 g/dL -  Total Bilirubin 0.3 - 1.2 mg/dL -  Alkaline Phos 38 - 126 U/L -  AST 15 - 41 U/L -  ALT 0 - 44 U/L -   CBC Latest Ref Rng & Units 04/13/2021  WBC 3.4 - 10.8 x10E3/uL 7.1  Hemoglobin 11.1 - 15.9 g/dL 12.8  Hematocrit 34.0 - 46.6 % 38.0  Platelets 150 - 450 x10E3/uL 235     Observation/objective: Appears in no acute distress over video visit today.  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  Progressive radiation changes noted in the right lower lobe.  Stable moderately severe emphysema.  I will see her back in 6 months with a repeat  CT chest without contrast  Mid back pain: We will get thoracolumbar spine x-ray today  Follow-up instructions:  I discussed the assessment and treatment plan with the patient. The patient was provided an opportunity to ask  questions and all were answered. The patient agreed with the plan and demonstrated an understanding of the instructions.   The patient was advised to call back or seek an in-person evaluation if the symptoms worsen or if the condition fails to improve as anticipated.   Visit Diagnosis: 1. Encounter for follow-up surveillance of lung cancer   2. Other depression     Dr. Randa Evens, MD, MPH [facility] Tel- [phone] 07/23/2021 12:06 PM

## 2021-07-26 ENCOUNTER — Telehealth: Payer: Self-pay | Admitting: *Deleted

## 2021-07-26 NOTE — Telephone Encounter (Signed)
Call returned to patient and informed of physician response, she agreed to contact her PCP

## 2021-07-26 NOTE — Telephone Encounter (Signed)
Patient called asking for results of recent xr and what is to be done about her back pain. Please advise  IMPRESSION: 1. Spondylosis and facet hypertrophy within the cervical and lumbar spine. 2. Chronic L1 compression deformity, stable. 3. No acute or destructive bony lesions.     Electronically Signed   By: Randa Ngo M.D.   On: 07/22/2021 16:28

## 2021-07-26 NOTE — Telephone Encounter (Signed)
No new findings. No fracture. She will need to d/c pcp if pain is ongoing

## 2021-07-27 ENCOUNTER — Other Ambulatory Visit: Payer: Self-pay

## 2021-07-27 ENCOUNTER — Ambulatory Visit (INDEPENDENT_AMBULATORY_CARE_PROVIDER_SITE_OTHER): Payer: Medicare Other | Admitting: Nurse Practitioner

## 2021-07-27 ENCOUNTER — Ambulatory Visit: Payer: Self-pay | Admitting: *Deleted

## 2021-07-27 ENCOUNTER — Encounter: Payer: Self-pay | Admitting: Nurse Practitioner

## 2021-07-27 VITALS — BP 118/72 | HR 94 | Temp 97.8°F | Resp 16 | Ht 62.0 in | Wt 147.0 lb

## 2021-07-27 DIAGNOSIS — M5489 Other dorsalgia: Secondary | ICD-10-CM

## 2021-07-27 MED ORDER — BACLOFEN 5 MG PO TABS
5.0000 mg | ORAL_TABLET | Freq: Three times a day (TID) | ORAL | 0 refills | Status: DC | PRN
Start: 2021-07-27 — End: 2021-10-05

## 2021-07-27 MED ORDER — LIDOCAINE 5 % EX PTCH: 1.0000 | MEDICATED_PATCH | CUTANEOUS | 0 refills | Status: DC

## 2021-07-27 MED ORDER — TRAMADOL HCL 50 MG PO TABS: 50.0000 mg | ORAL_TABLET | Freq: Two times a day (BID) | ORAL | 0 refills | Status: AC | PRN

## 2021-07-27 NOTE — Addendum Note (Signed)
Addended by: Serafina Royals F on: 07/27/2021 12:33 PM   Modules accepted: Orders

## 2021-07-27 NOTE — Progress Notes (Signed)
BP 118/72    Pulse 94    Temp 97.8 F (36.6 C) (Oral)    Resp 16    Ht _0  (1.575 m)    Wt 147 lb (66.7 kg)    SpO2 98%    BMI 26.89 kg/m    Subjective:    Patient ID: Christine Burnett, female    DOB: [birth year], 81 y.o.   MRN: [id number]  HPI: Christine Burnett is a 81 y.o. female, here with daughter  Chief Complaint  Patient presents with   Back Pain   Low/mid back pain:  She says she tried to open a window on Wednesday last week and she says her back started to hurt.  She denies any radiation of pain. She denies any incontinence or numbness or tingling in extremities. She says the pain is mid to lower midline back. She says it just aches so bad. She say Dr. Janese Banks on 07/22/2021 (Friday) and she ordered an xray of her back.  It showed: Spondylosis and facet hypertrophy within the cervical and lumbar spine, Chronic L1 compression deformity, stable and No acute or destructive bony lesions. She denies any fever, urinary complaints or incontinence. Discussed treatment options.  Recommended taking tylenol for pain and only using tramadol if pain is severe. Will also send in muscle relaxer.  Discussed using heat therapy as well. Discussed if pain continues will need to see pain specialist.    Relevant past medical, surgical, family and social history reviewed and updated as indicated. Interim medical history since our last visit reviewed. Allergies and medications reviewed and updated.  Review of Systems  Constitutional: Negative for fever or weight change.  Respiratory: Negative for cough and shortness of breath.   Cardiovascular: Negative for chest pain or palpitations.  Gastrointestinal: Negative for abdominal pain, no bowel changes.  Musculoskeletal: Positive for gait problem (uses cane) or joint swelling. Positive for back pain Skin: Negative for rash.  Neurological: Negative for dizziness or headache.  No other specific complaints in a complete review of systems (except as listed in  HPI above).      Objective:    BP 118/72    Pulse 94    Temp 97.8 F (36.6 C) (Oral)    Resp 16    Ht _1  (1.575 m)    Wt 147 lb (66.7 kg)    SpO2 98%    BMI 26.89 kg/m   Wt Readings from Last 3 Encounters:  07/27/21 147 lb (66.7 kg)  07/04/21 146 lb (66.2 kg)  04/15/21 145 lb (65.8 kg)    Physical Exam  Constitutional: Patient appears well-developed and well-nourished.  No distress.  HEENT: head atraumatic, normocephalic, pupils equal and reactive to light,  neck supple Cardiovascular: Normal rate, regular rhythm and normal heart sounds.  No murmur heard. No BLE edema. Pulmonary/Chest: Effort normal and breath sounds normal. No respiratory distress. Abdominal: Soft.  There is no tenderness. Musculoskeletal: tenderness to midline mid-lower back, No CVA tenderness Psychiatric: Patient has a normal mood and affect. behavior is normal. Judgment and thought content normal.   Results for orders placed or performed in visit on 04/13/21  CBC  Result Value Ref Range   WBC 7.1 3.4 - 10.8 x10E3/uL   RBC 3.88 3.77 - 5.28 x10E6/uL   Hemoglobin 12.8 11.1 - 15.9 g/dL   Hematocrit 38.0 34.0 - 46.6 %   MCV 98 (H) 79 - 97 fL   MCH 33.0 26.6 - 33.0 pg   MCHC 33.7  31.5 - 35.7 g/dL   RDW 11.9 11.7 - 15.4 %   Platelets 235 150 - 450 N27M1/OM  Basic metabolic panel  Result Value Ref Range   Glucose 102 (H) 70 - 99 mg/dL   BUN 25 8 - 27 mg/dL   Creatinine, Ser 1.59 (H) 0.57 - 1.00 mg/dL   eGFR 32 (L) >59 mL/min/1.73   BUN/Creatinine Ratio 16 12 - 28   Sodium 141 134 - 144 mmol/L   Potassium 4.6 3.5 - 5.2 mmol/L   Chloride 105 96 - 106 mmol/L   CO2 23 20 - 29 mmol/L   Calcium 9.5 8.7 - 10.3 mg/dL      Assessment & Plan:   1. Back pain without sciatica -heat therapy -take tylenol for pain, only use tramadol if pain is severe - traMADol (ULTRAM) 50 MG tablet; Take 1 tablet (50 mg total) by mouth every 12 (twelve) hours as needed for up to 3 days for severe pain.  Dispense: 6 tablet;  Refill: 0 - Baclofen 5 MG TABS; Take 5 mg by mouth 3 (three) times daily as needed (msk pain or spasms).  Dispense: 30 tablet; Refill: 0   Follow up plan: Return if symptoms worsen or fail to improve, for follow up.

## 2021-07-27 NOTE — Telephone Encounter (Signed)
Reason for Disposition  [1] MODERATE back pain (e.g., interferes with normal activities) AND [2] present > 3 days  Answer Assessment - Initial Assessment Questions 1. ONSET: "When did the pain begin?"      I'm having back pain.   I saw my cancer dr.   I had an x-ray done on Friday.  My MyChart says I have L-3 problems.   Back pain last Wed. During a CT scan. 2. LOCATION: "Where does it hurt?" (upper, mid or lower back)     Thoracic area is where the pain is coming from.   3. SEVERITY: "How bad is the pain?"  (e.g., Scale 1-10; mild, moderate, or severe)   - MILD (1-3): doesn't interfere with normal activities    - MODERATE (4-7): interferes with normal activities or awakens from sleep    - SEVERE (8-10): excruciating pain, unable to do any normal activities      *No Answer* 4. PATTERN: "Is the pain constant?" (e.g., yes, no; constant, intermittent)      *No Answer* 5. RADIATION: "Does the pain shoot into your legs or elsewhere?"     *No Answer* 6. CAUSE:  "What do you think is causing the back pain?"      *No Answer* 7. BACK OVERUSE:  "Any recent lifting of heavy objects, strenuous work or exercise?"     *No Answer* 8. MEDICATIONS: "What have you taken so far for the pain?" (e.g., nothing, acetaminophen, NSAIDS)     *No Answer* 9. NEUROLOGIC SYMPTOMS: "Do you have any weakness, numbness, or problems with bowel/bladder control?"     *No Answer* 10. OTHER SYMPTOMS: "Do you have any other symptoms?" (e.g., fever, abdominal pain, burning with urination, blood in urine)       *No Answer* 11. PREGNANCY: "Is there any chance you are pregnant?" (e.g., yes, no; LMP)       *No Answer*  Protocols used: Back Pain-A-AH

## 2021-07-27 NOTE — Telephone Encounter (Signed)
°  Chief Complaint: back pain   Had x ray done by cancer dr but referred to PCP.  Problem with L-3 per pt. Symptoms: mid back pain with any movement. Frequency: with movement, standing and laying down Pertinent Negatives: Patient denies N/A Disposition: [] ED /[] Urgent Care (no appt availability in office) / [x] Appointment(In office/virtual)/ []  [facility]/ [] Home Care/ [] Refused Recommended Disposition /[] [facility]/ []  Follow-up with PCP Additional Notes: Appt made for today

## 2021-08-01 ENCOUNTER — Ambulatory Visit: Payer: Medicare Other | Admitting: Radiation Oncology

## 2021-08-10 ENCOUNTER — Other Ambulatory Visit: Payer: Self-pay | Admitting: Nurse Practitioner

## 2021-08-10 ENCOUNTER — Telehealth: Payer: Self-pay

## 2021-08-10 DIAGNOSIS — M479 Spondylosis, unspecified: Secondary | ICD-10-CM

## 2021-08-10 DIAGNOSIS — M5489 Other dorsalgia: Secondary | ICD-10-CM

## 2021-08-10 NOTE — Telephone Encounter (Signed)
Copied from [place] [phone]. Topic: Referral - Request for Referral ?>> Aug 10, 2021 11:21 AM [name] wrote: ?Patient was advised by Christine Burnett at last office visit on 07/27/2021 if back pain did not improve patient can be referred to a back specialist. Patient requesting a referral ?

## 2021-08-26 ENCOUNTER — Other Ambulatory Visit: Payer: Self-pay

## 2021-08-26 ENCOUNTER — Ambulatory Visit
Admission: RE | Admit: 2021-08-26 | Discharge: 2021-08-26 | Disposition: A | Discharge status: Home / Self Care | Payer: Medicare Other | Source: Ambulatory Visit | Attending: Radiation Oncology | Admitting: Radiation Oncology

## 2021-08-26 ENCOUNTER — Encounter: Payer: Self-pay | Admitting: Radiation Oncology

## 2021-08-26 VITALS — BP 116/82 | HR 97 | Temp 96.7°F | Resp 20 | Wt 144.8 lb

## 2021-08-26 DIAGNOSIS — C3431 Malignant neoplasm of lower lobe, right bronchus or lung: Secondary | ICD-10-CM | POA: Insufficient documentation

## 2021-08-26 DIAGNOSIS — Z08 Encounter for follow-up examination after completed treatment for malignant neoplasm: Secondary | ICD-10-CM | POA: Diagnosis not present

## 2021-08-26 DIAGNOSIS — Z923 Personal history of irradiation: Secondary | ICD-10-CM | POA: Diagnosis not present

## 2021-08-26 NOTE — Progress Notes (Signed)
Radiation Oncology ?Follow up Note ? ?Name: Christine Burnett   ?Date:   08/26/2021 ?MRN:  [id number] ?DOB: 01-10-1941  ? ? ?This 81 y.o. female presents to the clinic today for 25-month follow-up status post SBRT to right lower lobe for a non-small cell lung cancer. ? ?REFERRING PROVIDER: Delsa Grana, PA-C ? ?HPI: Patient is a an 81 year old female now out 11 months having completed SBRT to her right lower lobe for a non-small cell lung cancer.  Seen today in routine follow-up she is doing well.  She specifically denies cough hemoptysis or chest tightness..  She had a recent CT scan of the chest which I have reviewed showing similar appearance of postradiation changes to the right lower lobe no evidence of residual or recurrent disease.  No other evidence of pulmonary mass or nodularity. ? ?COMPLICATIONS OF TREATMENT: none ? ?FOLLOW UP COMPLIANCE: keeps appointments  ? ?PHYSICAL EXAM:  ?BP 116/82 (BP Location: Right Arm, Patient Position: Sitting)   Pulse 97   Temp (!) 96.7 ?F (35.9 ?C) (Tympanic)   Resp 20   Wt 144 lb 12.8 oz (65.7 kg)   BMI 26.48 kg/m?  ?Well-developed well-nourished patient in NAD. HEENT reveals PERLA, EOMI, discs not visualized.  Oral cavity is clear. No oral mucosal lesions are identified. Neck is clear without evidence of cervical or supraclavicular adenopathy. Lungs are clear to A&P. Cardiac examination is essentially unremarkable with regular rate and rhythm without murmur rub or thrill. Abdomen is benign with no organomegaly or masses noted. Motor sensory and DTR levels are equal and symmetric in the upper and lower extremities. Cranial nerves II through XII are grossly intact. Proprioception is intact. No peripheral adenopathy or edema is identified. No motor or sensory levels are noted. Crude visual fields are within normal range. ? ?RADIOLOGY RESULTS: CT scans reviewed compatible with above-stated findings ? ?PLAN: Present time patient is doing well with no significant side effects  from her SBRT.  Her CT scans showed no evidence to suggest progressive disease.  Of asked to see her back in 6 months for follow-up and will have another CT scan performed in August.  Patient knows to call with any concerns at any time she continues close follow-up care with Dr. Janese Banks. ? ?I would like to take this opportunity to thank you for allowing me to participate in the care of your patient.. ?  ? Noreene Filbert, MD ? ?

## 2021-09-20 DIAGNOSIS — Z20822 Contact with and (suspected) exposure to covid-19: Secondary | ICD-10-CM | POA: Diagnosis not present

## 2021-09-24 ENCOUNTER — Emergency Department
Admission: EM | Admit: 2021-09-24 | Discharge: 2021-09-24 | Disposition: A | Discharge status: Home / Self Care | Payer: Medicare Other | Attending: Emergency Medicine | Admitting: Emergency Medicine

## 2021-09-24 ENCOUNTER — Emergency Department: Payer: Medicare Other

## 2021-09-24 ENCOUNTER — Other Ambulatory Visit: Payer: Self-pay

## 2021-09-24 DIAGNOSIS — R079 Chest pain, unspecified: Secondary | ICD-10-CM | POA: Diagnosis not present

## 2021-09-24 DIAGNOSIS — N189 Chronic kidney disease, unspecified: Secondary | ICD-10-CM | POA: Insufficient documentation

## 2021-09-24 DIAGNOSIS — S22000A Wedge compression fracture of unspecified thoracic vertebra, initial encounter for closed fracture: Secondary | ICD-10-CM

## 2021-09-24 DIAGNOSIS — K529 Noninfective gastroenteritis and colitis, unspecified: Secondary | ICD-10-CM | POA: Diagnosis not present

## 2021-09-24 DIAGNOSIS — X58XXXA Exposure to other specified factors, initial encounter: Secondary | ICD-10-CM | POA: Diagnosis not present

## 2021-09-24 DIAGNOSIS — S22008A Other fracture of unspecified thoracic vertebra, initial encounter for closed fracture: Secondary | ICD-10-CM | POA: Insufficient documentation

## 2021-09-24 DIAGNOSIS — S3992XA Unspecified injury of lower back, initial encounter: Secondary | ICD-10-CM | POA: Diagnosis not present

## 2021-09-24 DIAGNOSIS — M549 Dorsalgia, unspecified: Secondary | ICD-10-CM | POA: Diagnosis not present

## 2021-09-24 DIAGNOSIS — J439 Emphysema, unspecified: Secondary | ICD-10-CM | POA: Diagnosis not present

## 2021-09-24 DIAGNOSIS — M546 Pain in thoracic spine: Secondary | ICD-10-CM | POA: Diagnosis not present

## 2021-09-24 DIAGNOSIS — I714 Abdominal aortic aneurysm, without rupture, unspecified: Secondary | ICD-10-CM | POA: Diagnosis not present

## 2021-09-24 DIAGNOSIS — M4699 Unspecified inflammatory spondylopathy, multiple sites in spine: Secondary | ICD-10-CM | POA: Diagnosis not present

## 2021-09-24 DIAGNOSIS — M40204 Unspecified kyphosis, thoracic region: Secondary | ICD-10-CM | POA: Diagnosis not present

## 2021-09-24 DIAGNOSIS — S24109A Unspecified injury at unspecified level of thoracic spinal cord, initial encounter: Secondary | ICD-10-CM | POA: Diagnosis present

## 2021-09-24 LAB — COMPREHENSIVE METABOLIC PANEL
ALT: 15 U/L (ref 0–44)
AST: 21 U/L (ref 15–41)
Albumin: 3.8 g/dL (ref 3.5–5.0)
Alkaline Phosphatase: 110 U/L (ref 38–126)
Anion gap: 7 (ref 5–15)
BUN: 31 mg/dL — ABNORMAL HIGH (ref 8–23)
CO2: 26 mmol/L (ref 22–32)
Calcium: 9.7 mg/dL (ref 8.9–10.3)
Chloride: 105 mmol/L (ref 98–111)
Creatinine, Ser: 1.66 mg/dL — ABNORMAL HIGH (ref 0.44–1.00)
GFR, Estimated: 31 mL/min — ABNORMAL LOW (ref >60)
Glucose, Bld: 124 mg/dL — ABNORMAL HIGH (ref 70–99)
Potassium: 4.1 mmol/L (ref 3.5–5.1)
Sodium: 138 mmol/L (ref 135–145)
Total Bilirubin: 0.7 mg/dL (ref 0.3–1.2)
Total Protein: 7.8 g/dL (ref 6.5–8.1)

## 2021-09-24 LAB — CBC WITH DIFFERENTIAL/PLATELET
Abs Immature Granulocytes: 0.06 10*3/uL (ref 0.00–0.07)
Basophils Absolute: 0.1 10*3/uL (ref 0.0–0.1)
Basophils Relative: 1 %
Eosinophils Absolute: 0.1 10*3/uL (ref 0.0–0.5)
Eosinophils Relative: 2 %
HCT: 38 % (ref 36.0–46.0)
Hemoglobin: 12.3 g/dL (ref 12.0–15.0)
Immature Granulocytes: 1 %
Lymphocytes Relative: 7 %
Lymphs Abs: 0.7 10*3/uL (ref 0.7–4.0)
MCH: 32.7 pg (ref 26.0–34.0)
MCHC: 32.4 g/dL (ref 30.0–36.0)
MCV: 101.1 fL — ABNORMAL HIGH (ref 80.0–100.0)
Monocytes Absolute: 0.6 10*3/uL (ref 0.1–1.0)
Monocytes Relative: 6 %
Neutro Abs: 8 10*3/uL — ABNORMAL HIGH (ref 1.7–7.7)
Neutrophils Relative %: 83 %
Platelets: 242 10*3/uL (ref 150–400)
RBC: 3.76 MIL/uL — ABNORMAL LOW (ref 3.87–5.11)
RDW: 13.5 % (ref 11.5–15.5)
WBC: 9.5 10*3/uL (ref 4.0–10.5)
nRBC: 0 % (ref 0.0–0.2)

## 2021-09-24 LAB — URINALYSIS, ROUTINE W REFLEX MICROSCOPIC
Bilirubin Urine: NEGATIVE
Glucose, UA: NEGATIVE mg/dL
Hgb urine dipstick: NEGATIVE
Ketones, ur: NEGATIVE mg/dL
Leukocytes,Ua: NEGATIVE
Nitrite: NEGATIVE
Protein, ur: NEGATIVE mg/dL
Specific Gravity, Urine: 1.013 (ref 1.005–1.030)
pH: 5 (ref 5.0–8.0)

## 2021-09-24 LAB — LIPASE, BLOOD: Lipase: 41 U/L (ref 11–51)

## 2021-09-24 MED ORDER — OXYCODONE-ACETAMINOPHEN 5-325 MG PO TABS
1.0000 | ORAL_TABLET | Freq: Once | ORAL | Status: AC
  Administered 2021-09-24: 1 via ORAL
  Filled 2021-09-24: qty 1

## 2021-09-24 MED ORDER — ONDANSETRON 4 MG PO TBDP
4.0000 mg | ORAL_TABLET | Freq: Once | ORAL | Status: AC
  Administered 2021-09-24: 4 mg via ORAL
  Filled 2021-09-24: qty 1

## 2021-09-24 MED ORDER — GADOBUTROL 1 MMOL/ML IV SOLN
6.0000 mL | Freq: Once | INTRAVENOUS | Status: AC | PRN
  Administered 2021-09-24: 6 mL via INTRAVENOUS
  Filled 2021-09-24: qty 6

## 2021-09-24 MED ORDER — ONDANSETRON 4 MG PO TBDP: 4.0000 mg | ORAL_TABLET | Freq: Three times a day (TID) | ORAL | 0 refills | Status: AC | PRN

## 2021-09-24 MED ORDER — OXYCODONE-ACETAMINOPHEN 5-325 MG PO TABS
1.0000 | ORAL_TABLET | Freq: Four times a day (QID) | ORAL | 0 refills | Status: AC | PRN
Start: 2021-09-24 — End: 2021-09-27

## 2021-09-24 NOTE — ED Provider Notes (Signed)
? ? ?Provider Note ? ?Patient Contact: 4:03 PM (approximate) ? ? ?History  ? ?Back Pain ? ? ?HPI ? ?Christine Burnett is a 81 y.o. female with a history of CKD, chronic bronchitis, prior lung neoplasm in remission depression and hyperlipidemia presents to the emergency department with upper back pain.  Patient states that she has had pain for several weeks if not months.  She denies numbness or tingling in the upper and lower extremities and reports that she currently has no pain.  She denies chest pain, chest tightness or shortness of breath.  She states that upper back pain is reproducible in nature. ? ?  ? ? ?Physical Exam  ? ?Triage Vital Signs: ?ED Triage Vitals  ?Enc Vitals Group  ?   BP 09/24/21 1541 116/79  ?   Pulse Rate 09/24/21 1541 66  ?   Resp 09/24/21 1543 (!) 22  ?   Temp 09/24/21 1541 97.7 ?F (36.5 ?C)  ?   Temp Source 09/24/21 1541 Oral  ?   SpO2 09/24/21 1541 93 %  ?   Weight --   ?   Height 09/24/21 1542 5\' 2"  (1.575 m)  ?   Head Circumference --   ?   Peak Flow --   ?   Pain Score 09/24/21 1541 1  ?   Pain Loc --   ?   Pain Edu? --   ?   Excl. in [place]? --   ? ? ?Most recent vital signs: ?Vitals:  ? 09/24/21 1541 09/24/21 1543  ?BP: 116/79   ?Pulse: 66   ?Resp:  (!) 22  ?Temp: 97.7 ?F (36.5 ?C)   ?SpO2: 93%   ? ? ? ?General: Alert and in no acute distress. ?Eyes:  PERRL. EOMI. ?Head: No acute traumatic findings ?ENT: ?     Nose: No congestion/rhinnorhea. ?     Mouth/Throat: Mucous membranes are moist.  ?Neck: No stridor. No cervical spine tenderness to palpation. ?Cardiovascular:  Good peripheral perfusion ?Respiratory: Normal respiratory effort without tachypnea or retractions. Lungs CTAB. Good air entry to the bases with no decreased or absent breath sounds. ?Gastrointestinal: Bowel sounds ?4 quadrants. Soft and nontender to palpation. No guarding or rigidity. No palpable masses. No distention. No CVA tenderness. ?Musculoskeletal: Full range of motion to all extremities.  Patient has midline  thoracic spine tenderness that is reproducible with palpation. ?Neurologic:  No gross focal neurologic deficits are appreciated.  ?Skin:   No rash noted ?Other: ? ? ?ED Results / Procedures / Treatments  ? ?Labs ?(all labs ordered are listed, but only abnormal results are displayed) ?Labs Reviewed  ?CBC WITH DIFFERENTIAL/PLATELET - Abnormal; Notable for the following components:  ?    Result Value  ? RBC 3.76 (*)   ? MCV 101.1 (*)   ? Neutro Abs 8.0 (*)   ? All other components within normal limits  ?COMPREHENSIVE METABOLIC PANEL - Abnormal; Notable for the following components:  ? Glucose, Bld 124 (*)   ? BUN 31 (*)   ? Creatinine, Ser 1.66 (*)   ? GFR, Estimated 31 (*)   ? All other components within normal limits  ?URINALYSIS, ROUTINE W REFLEX MICROSCOPIC - Abnormal; Notable for the following components:  ? Color, Urine YELLOW (*)   ? APPearance CLEAR (*)   ? All other components within normal limits  ?LIPASE, BLOOD  ? ? ? ? ? ?RADIOLOGY ? ?I personally viewed and evaluated these images as part of my medical decision making,  as well as reviewing the written report by the radiologist. ? ?ED Provider Interpretation: I personally reviewed MRI of the thoracic spine and patient has a acute to subacute burst type fracture of T9 with 50% height loss. ? ?PROCEDURES: ? ?Critical Care performed: No ? ?Procedures ? ? ?MEDICATIONS ORDERED IN ED: ?Medications  ?oxyCODONE-acetaminophen (PERCOCET/ROXICET) 5-325 MG per tablet 1 tablet (has no administration in time range)  ?ondansetron (ZOFRAN-ODT) disintegrating tablet 4 mg (has no administration in time range)  ?gadobutrol (GADAVIST) 1 MMOL/ML injection 6 mL (6 mLs Intravenous Contrast Given 09/24/21 1929)  ? ? ? ?IMPRESSION / MDM / ASSESSMENT AND PLAN / ED COURSE  ?I reviewed the triage vital signs and the nursing notes. ?             ?               ? ?Differential diagnosis includes, but is not limited to, compression fracture, urinary tract infection, muscle spasm,  malignancy, pleural effusion... ? ?Assessment and plan ?Upper back pain ?81 year old female presents to the emergency department with upper back pain that is worsened in intensity over the past week. ? ?Vital signs are reassuring at triage.  On physical exam, patient was alert, active and nontoxic-appearing.  Patient did have midline thoracic spine tenderness.  CT of the thoracic spine indicated a new since February compression fracture of T9 with 50% height loss.  I reached out to neurosurgeon on-call, Dr. Cari Caraway.  Very much appreciate time and consult.  Given patient's history of right lung neoplasm, Dr. Cari Caraway recommended MRI to better characterize fracture to rule out bony metastasis.  Patient had no evidence of malignancy on MRI of the thoracic spine.  I personally reached out to radiologist, Dr. Collins Scotland who feels that bone marrow edema on MRI is traumatic in nature.  Patient was prescribed a short course of Percocet and was equipped with a TLSO brace prior to discharge. ?  ? ? ?FINAL CLINICAL IMPRESSION(S) / ED DIAGNOSES  ? ?Final diagnoses:  ?Compression fracture of body of thoracic vertebra (HCC)  ? ? ? ?Rx / DC Orders  ? ?ED Discharge Orders   ? ?      Ordered  ?  oxyCODONE-acetaminophen (PERCOCET/ROXICET) 5-325 MG tablet  Every 6 hours PRN       ? 09/24/21 2040  ?  ondansetron (ZOFRAN-ODT) 4 MG disintegrating tablet  Every 8 hours PRN       ? 09/24/21 2040  ? ?  ?  ? ?  ? ? ? ?Note:  This document was prepared using Dragon voice recognition software and may include unintentional dictation errors. ?  ?Lannie Fields, PA-C ?09/24/21 2130 ? ?  ?Naaman Plummer, MD ?10/01/21 1258 ? ?

## 2021-09-24 NOTE — ED Triage Notes (Signed)
Patient to ER via [facility] from home with complaint of mid-lower back pain and spams. Reports this has been ongoing for months, was seen and had an x-ray.  Patient was prescribed muscle relaxer and tramadol with some improvement, it has since returned. Patient took doses of both at 1030 this morning. Lidocaine patch present. Denies urinary symptoms.  ? ?Patient reports pain is a 1/10 when not moving, 10/10 with movement.  ? ?Ems VSS.  ?

## 2021-09-24 NOTE — ED Notes (Signed)
Patient undressed for MRI, lead stickers removed and earring removed and placed in specimen cup with patient's label on it. Clothes, slippers and speciment cup placed in belongings bag. ?

## 2021-09-24 NOTE — ED Notes (Signed)
Called Ortho Tech for TLSO brace  will bring it  [phone] ?

## 2021-09-24 NOTE — Progress Notes (Signed)
Orthopedic Tech Progress Note ?Patient Details:  ?Christine Burnett ?Nov 14, 1940 ?[id number] ?TLSO Brace has been ordered from [facility]  ?Patient ID: Christine Burnett, female   DOB: 1940/09/12, 81 y.o.   MRN: [id number] ? ?Christine Burnett ?09/24/2021, 5:52 PM ? ?

## 2021-09-24 NOTE — ED Notes (Signed)
Patient arrived back from MRI. Patient was assisted to a standing position and then ambulated with stand-by assist to hallway bathroom and back to the stretcher. Patient was able to reposition self on stretcher. Patient refused to wear the TLSO brace. Daughter is at bedside. ?

## 2021-09-24 NOTE — Discharge Instructions (Addendum)
Please wear TLSO brace at home when ambulating. ?You can take Percocet for pain. ?Keep in mind that Percocet is constipating.  Please take MiraLAX with it in order to avoid constipation.  54 ?

## 2021-10-04 DIAGNOSIS — Z20822 Contact with and (suspected) exposure to covid-19: Secondary | ICD-10-CM | POA: Diagnosis not present

## 2021-10-05 ENCOUNTER — Other Ambulatory Visit: Payer: Self-pay | Admitting: Family Medicine

## 2021-10-05 DIAGNOSIS — M5489 Other dorsalgia: Secondary | ICD-10-CM

## 2021-10-05 NOTE — Telephone Encounter (Signed)
Medication Refill - Medication:  ?Baclofen 5 MG TABS ? ?Has the patient contacted their pharmacy? Yes.   ?Contact PCP ? ?Preferred Pharmacy (with phone number or street name):  ?[facility] [place] [facility] [address] [place]  ?[place], [place] [address]  ?Phone:  [phone]  Fax:  [phone]  ? ?Has the patient been seen for an appointment in the last year OR does the patient have an upcoming appointment? Yes.   ? ?Agent: Please be advised that RX refills may take up to 3 business days. We ask that you follow-up with your pharmacy. ?

## 2021-10-06 MED ORDER — BACLOFEN 5 MG PO TABS: 5.0000 mg | ORAL_TABLET | Freq: Three times a day (TID) | ORAL | 2 refills | Status: DC | PRN

## 2021-10-06 NOTE — Telephone Encounter (Signed)
Requested Prescriptions  ?Pending Prescriptions Disp Refills  ?? Baclofen 5 MG TABS 30 tablet 0  ?  Sig: Take 5 mg by mouth 3 (three) times daily as needed (msk pain or spasms).  ?  ? Analgesics:  Muscle Relaxants - baclofen Failed - 10/05/2021 12:53 PM  ?  ?  Failed - Cr in normal range and within 180 days  ?  Creat  ?Date Value Ref Range Status  ?05/03/2020 1.54 (H) 0.60 - 0.88 mg/dL Final  ?  Comment:  ?  For patients >82 years of age, the reference limit ?for Creatinine is approximately 13% higher for people ?identified as African-American. ?. ?  ? ?Creatinine, Ser  ?Date Value Ref Range Status  ?09/24/2021 1.66 (H) 0.44 - 1.00 mg/dL Final  ?   ?  ?  Passed - eGFR is 30 or above and within 180 days  ?  GFR, Est African American  ?Date Value Ref Range Status  ?05/03/2020 37 (L) > OR = 60 mL/min/1.65m2 Final  ? ?GFR, Est Non African American  ?Date Value Ref Range Status  ?05/03/2020 32 (L) > OR = 60 mL/min/1.35m2 Final  ? ?GFR, Estimated  ?Date Value Ref Range Status  ?09/24/2021 31 (L) >60 mL/min Final  ?  Comment:  ?  (NOTE) ?Calculated using the CKD-EPI Creatinine Equation (2021) ?  ? ?eGFR  ?Date Value Ref Range Status  ?04/13/2021 32 (L) >59 mL/min/1.73 Final  ?   ?  ?  Passed - Valid encounter within last 6 months  ?  Recent Outpatient Visits   ?      ? 2 months ago Back pain without sciatica  ? [place], FNP  ? 6 months ago Oral thrush  ? [place], DO  ? 7 months ago Mild episode of recurrent major depressive disorder ([place])  ? [facility] Bo Merino, FNP  ? 7 months ago Mild episode of recurrent major depressive disorder ([place])  ? [place], FNP  ? 1 year ago Stage 3b chronic kidney disease [facility])  ? [facility] Delsa Grana, [place]  ?  ?  ?Future Appointments   ?        ? In 4 months [place]   ?  ? ?  ?  ?  ? ? ?

## 2021-10-11 DIAGNOSIS — S22060A Wedge compression fracture of T7-T8 vertebra, initial encounter for closed fracture: Secondary | ICD-10-CM | POA: Diagnosis not present

## 2021-10-11 DIAGNOSIS — S22070A Wedge compression fracture of T9-T10 vertebra, initial encounter for closed fracture: Secondary | ICD-10-CM | POA: Diagnosis not present

## 2021-10-11 DIAGNOSIS — S22000A Wedge compression fracture of unspecified thoracic vertebra, initial encounter for closed fracture: Secondary | ICD-10-CM | POA: Diagnosis not present

## 2021-10-13 DIAGNOSIS — I959 Hypotension, unspecified: Secondary | ICD-10-CM | POA: Diagnosis not present

## 2021-10-13 DIAGNOSIS — N2581 Secondary hyperparathyroidism of renal origin: Secondary | ICD-10-CM | POA: Diagnosis not present

## 2021-10-13 DIAGNOSIS — N184 Chronic kidney disease, stage 4 (severe): Secondary | ICD-10-CM | POA: Diagnosis not present

## 2021-11-07 ENCOUNTER — Ambulatory Visit: Payer: Self-pay

## 2021-11-07 ENCOUNTER — Telehealth (INDEPENDENT_AMBULATORY_CARE_PROVIDER_SITE_OTHER): Payer: Medicare Other | Admitting: Family Medicine

## 2021-11-07 ENCOUNTER — Ambulatory Visit: Payer: Medicare Other | Admitting: Family Medicine

## 2021-11-07 DIAGNOSIS — Z91199 Patient's noncompliance with other medical treatment and regimen due to unspecified reason: Secondary | ICD-10-CM

## 2021-11-07 NOTE — Telephone Encounter (Signed)
The patient was bitten by a tick in their left armpit   The patient shares that the bite is red and elevated   The patient shares that the tick was removed   The patient would like to be prescribed something for their skin irritation   Please contact further when possible    Chief Complaint: Pulled engorged tick off last Monday. Left axilla "has a bull's eye on my skin." Symptoms: Red Frequency: Last week Pertinent Negatives: Patient denies fever Disposition: [] ED /[] Urgent Care (no appt availability in office) / [x] Appointment(In office/virtual)/ []  [facility]/ [] Home Care/ [] Refused Recommended Disposition /[] [facility]/ []  Follow-up with PCP Additional Notes: Requests medication.  Reason for Disposition  Red ring or bull's-eye rash occurs at tick bite  Answer Assessment - Initial Assessment Questions 1. TYPE of TICK: "Is it a wood tick or a deer tick?" (e.g., deer tick, wood tick; unsure)     Unsure 2. SIZE of TICK: "How big is the tick?" (e.g., size of poppy seed, apple seed, watermelon seed; unsure) Note: Deer ticks can be the size of a poppy seed (nymph) or an apple seed (adult).       Small 3. ENGORGED: "Did the tick look flat or engorged (full, swollen)?" (e.g., flat, engorged; unsure)     Yes 4. LOCATION: "Where is the tick bite located?"      Left arm pit 5. ONSET: "How long do you think the tick was attached before you removed it?" (e.g., 5 hours, 2 days)      Monday 6. APPEARANCE of BITE or RASH: "What does the site look like?"     Unsure 7. PREGNANCY: "Is there any chance you are pregnant?" "When was your last menstrual period?"     no  Protocols used: Tick Bite-A-AH

## 2021-11-08 ENCOUNTER — Encounter: Payer: Self-pay | Admitting: Family Medicine

## 2021-11-08 ENCOUNTER — Ambulatory Visit (INDEPENDENT_AMBULATORY_CARE_PROVIDER_SITE_OTHER): Payer: Medicare Other | Admitting: Family Medicine

## 2021-11-08 VITALS — BP 140/82 | HR 103 | Temp 97.6°F | Resp 16 | Ht 67.0 in | Wt 146.9 lb

## 2021-11-08 DIAGNOSIS — J449 Chronic obstructive pulmonary disease, unspecified: Secondary | ICD-10-CM

## 2021-11-08 DIAGNOSIS — F331 Major depressive disorder, recurrent, moderate: Secondary | ICD-10-CM

## 2021-11-08 DIAGNOSIS — W57XXXA Bitten or stung by nonvenomous insect and other nonvenomous arthropods, initial encounter: Secondary | ICD-10-CM | POA: Diagnosis not present

## 2021-11-08 DIAGNOSIS — R21 Rash and other nonspecific skin eruption: Secondary | ICD-10-CM | POA: Diagnosis not present

## 2021-11-08 DIAGNOSIS — S40862A Insect bite (nonvenomous) of left upper arm, initial encounter: Secondary | ICD-10-CM

## 2021-11-08 MED ORDER — DOXYCYCLINE HYCLATE 100 MG PO TABS: 100.0000 mg | ORAL_TABLET | Freq: Two times a day (BID) | ORAL | 0 refills | Status: DC

## 2021-11-08 NOTE — Progress Notes (Signed)
Patient ID: Christine Burnett, female    DOB: 10-29-1940, 81 y.o.   MRN: [id number]  PCP: Delsa Grana, PA-C  Chief Complaint  Patient presents with   Animal Bite    Possible tick bite, itches, no pain    Subjective:   Christine Burnett is a 81 y.o. female, presents to clinic with CC of the following:  HPI   Mood more down than normal - more tearful, on paxil 40 mg, here with her daughter    11/08/2021   11:37 AM 11/07/2021   11:33 AM 07/27/2021   11:46 AM  Depression screen PHQ 2/9  Decreased Interest 2 1 0  Down, Depressed, Hopeless 3 1 0  PHQ - 2 Score 5 2 0  Altered sleeping 0 0   Tired, decreased energy 2 1   Change in appetite 1 1   Feeling bad or failure about yourself  0 0   Trouble concentrating 3 0   Moving slowly or fidgety/restless 0 0   Suicidal thoughts 0 0   PHQ-9 Score 11 4   Difficult doing work/chores Somewhat difficult Somewhat difficult    COPD limited exercise with DOE not using inhaler daily concerned about ICS and worsening osteoporosis  Managed by pulmonary  Recent compression fx managed by [facility]  Loss in independence and isolated at home and daughter went out of town and she had worse moods   Bug bite tick removed from under left armpit Removed was flat not engorged or embeded, itchy, no body aches, joint pain beyond normal, fevers, chills, sweats, or rash     Patient Active Problem List   Diagnosis Date Noted   Coronary artery disease of native artery of native heart with stable angina pectoris ([place])    COPD (chronic obstructive pulmonary disease) ([place]) 01/28/2021   CKD (chronic kidney disease) stage 4, GFR 15-29 ml/min ([place]) 01/21/2021   Goals of care, counseling/discussion 07/17/2020   Malignant neoplasm of lower lobe of right lung ([place]) 07/04/2020   Pneumonia due to COVID-19 virus 03/17/2020   Nocturnal hypoxia 03/17/2020   Prediabetes 08/13/2018   Allergic rhinitis 09/27/2017   DD (diverticular disease)  09/27/2017   Stage 3b chronic kidney disease ([place]) 08/31/2017   Hyperparathyroidism, 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   Osteoporosis without current pathological fracture 03/30/2017   Cardiac murmur 04/07/2015   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 stage 4 chronic kidney disease, or unspecified chronic kidney disease 02/10/2015   Anxiety 01/05/2015   Hyperlipidemia 01/05/2015   HTN (hypertension) 09/24/2014      Current Outpatient Medications:    acetaminophen (TYLENOL) 650 MG CR tablet, Take 1 tablet (650 mg total) by mouth 4 (four) times daily as needed for pain., Disp: , Rfl:    albuterol (VENTOLIN HFA) 108 (90 Base) MCG/ACT inhaler, Inhale 2 puffs into the lungs every 6 (six) hours as needed for wheezing or shortness of breath., Disp: 8 g, Rfl: 0   aspirin 81 MG EC tablet, Take 81 mg by mouth at bedtime. Swallow whole., Disp: , Rfl:    atorvastatin (LIPITOR) 40 MG tablet, Take 1 tablet (40 mg total) by mouth at bedtime., Disp: 90 tablet, Rfl: 3   Baclofen 5 MG TABS, Take 5 mg by mouth 3 (three) times daily as needed (msk pain or spasms)., Disp: 90 tablet, Rfl: 2   Budeson-Glycopyrrol-Formoterol (BREZTRI AEROSPHERE) 160-9-4.8 MCG/ACT AERO, Inhale 2 puffs into the lungs in the morning and at bedtime. (Patient taking differently: Inhale  2 puffs into the lungs 2 (two) times daily as needed (respiratory issues.).), Disp: 10.7 g, Rfl: 6   Calcium 200 MG TABS, Take 300 mg by mouth every Monday, Tuesday, Wednesday, Thursday, and Friday., Disp: , Rfl:    Cholecalciferol (VITAMIN D3) 50 MCG (2000 UT) TABS, Take 2,000 Units by mouth at bedtime., Disp: , Rfl:    isosorbide mononitrate (IMDUR) 30 MG 24 hr tablet, Take 0.5 tablets (15 mg total) by mouth daily., Disp: 30 tablet, Rfl: 6   lidocaine (LIDODERM) 5 %, Place 1 patch onto the skin daily. Remove & Discard patch within 12 hours or as directed by MD, Disp: 30 patch, Rfl: 0   melatonin 3 MG TABS tablet, Take 3 mg by mouth at  bedtime., Disp: , Rfl:    PARoxetine (PAXIL) 40 MG tablet, Take 1 tablet (40 mg total) by mouth every morning., Disp: 90 tablet, Rfl: 3   Probiotic Product (PROBIOTIC PO), Take 1 capsule by mouth at bedtime., Disp: , Rfl:    Spacer/Aero-Holding Chambers DEVI, USE AS DIRECTED, Disp: 1 each, Rfl: 0   Allergies  Allergen Reactions   Zithromax [Azithromycin]    Cefuroxime Axetil Rash   Ciprofloxacin Nausea Only    Unknown   Codeine Other (See Comments)    Patient does not like how the medication makes you feel      Social History   Tobacco Use   Smoking status: Former    Packs/day: 1.00    Years: 61.00    Pack years: 61.00    Types: Cigarettes    Quit date: 03/25/2017    Years since quitting: 4.6   Smokeless tobacco: Never  Vaping Use   Vaping Use: Never used  Substance Use Topics   Alcohol use: No   Drug use: No      Chart Review Today: I personally reviewed active problem list, medication list, allergies, family history, social history, health maintenance, notes from last encounter, lab results, imaging with the patient/caregiver today.   Review of Systems  Constitutional: Negative.  Negative for activity change, appetite change, diaphoresis, fatigue, fever and unexpected weight change.  HENT: Negative.    Eyes: Negative.   Respiratory: Negative.    Cardiovascular: Negative.   Gastrointestinal: Negative.   Endocrine: Negative.   Genitourinary: Negative.   Musculoskeletal: Negative.   Skin: Negative.   Allergic/Immunologic: Negative.   Neurological: Negative.   Hematological: Negative.   Psychiatric/Behavioral: Negative.    All other systems reviewed and are negative.     Objective:   Vitals:   11/08/21 1140  BP: 140/82  Pulse: (!) 103  Resp: 16  Temp: 97.6 F (36.4 C)  TempSrc: Oral  SpO2: 95%  Weight: 146 lb 14.4 oz (66.6 kg)  Height: 5\' 7"  (1.702 m)    Body mass index is 23.01 kg/m.  Physical Exam Vitals and nursing note reviewed.   Constitutional:      General: She is not in acute distress.    Appearance: She is not ill-appearing, toxic-appearing or diaphoretic.  HENT:     Head: Normocephalic and atraumatic.     Right Ear: External ear normal.     Left Ear: External ear normal.     Nose: Nose normal.  Cardiovascular:     Rate and Rhythm: Normal rate and regular rhythm.     Pulses: Normal pulses.     Heart sounds: Normal heart sounds.     Comments: HR normal at time of exam Pulmonary:     Effort:  Pulmonary effort is normal.     Breath sounds: Normal breath sounds.  Musculoskeletal:     Comments: Wearing back brace, walking with a cane  Skin:    Comments: Left axilla roughly 1.5 cm diameter circular area of mild swelling with excoriations, no erythema, ttp, fluctuance No bruising, petechia or erythema migrans    Neurological:     Mental Status: She is alert.     Gait: Gait abnormal.  Psychiatric:        Attention and Perception: Attention normal.        Mood and Affect: Mood is depressed. Affect is tearful.        Speech: Speech normal.        Behavior: Behavior is cooperative.     Results for orders placed or performed during the hospital encounter of 09/24/21  CBC with Differential  Result Value Ref Range   WBC 9.5 4.0 - 10.5 K/uL   RBC 3.76 (L) 3.87 - 5.11 MIL/uL   Hemoglobin 12.3 12.0 - 15.0 g/dL   HCT 38.0 36.0 - 46.0 %   MCV 101.1 (H) 80.0 - 100.0 fL   MCH 32.7 26.0 - 34.0 pg   MCHC 32.4 30.0 - 36.0 g/dL   RDW 13.5 11.5 - 15.5 %   Platelets 242 150 - 400 K/uL   nRBC 0.0 0.0 - 0.2 %   Neutrophils Relative % 83 %   Neutro Abs 8.0 (H) 1.7 - 7.7 K/uL   Lymphocytes Relative 7 %   Lymphs Abs 0.7 0.7 - 4.0 K/uL   Monocytes Relative 6 %   Monocytes Absolute 0.6 0.1 - 1.0 K/uL   Eosinophils Relative 2 %   Eosinophils Absolute 0.1 0.0 - 0.5 K/uL   Basophils Relative 1 %   Basophils Absolute 0.1 0.0 - 0.1 K/uL   Immature Granulocytes 1 %   Abs Immature Granulocytes 0.06 0.00 - 0.07 K/uL   Comprehensive metabolic panel  Result Value Ref Range   Sodium 138 135 - 145 mmol/L   Potassium 4.1 3.5 - 5.1 mmol/L   Chloride 105 98 - 111 mmol/L   CO2 26 22 - 32 mmol/L   Glucose, Bld 124 (H) 70 - 99 mg/dL   BUN 31 (H) 8 - 23 mg/dL   Creatinine, Ser 1.66 (H) 0.44 - 1.00 mg/dL   Calcium 9.7 8.9 - 10.3 mg/dL   Total Protein 7.8 6.5 - 8.1 g/dL   Albumin 3.8 3.5 - 5.0 g/dL   AST 21 15 - 41 U/L   ALT 15 0 - 44 U/L   Alkaline Phosphatase 110 38 - 126 U/L   Total Bilirubin 0.7 0.3 - 1.2 mg/dL   GFR, Estimated 31 (L) >60 mL/min   Anion gap 7 5 - 15  Urinalysis, Routine w reflex microscopic Urine, Clean Catch  Result Value Ref Range   Color, Urine YELLOW (A) YELLOW   APPearance CLEAR (A) CLEAR   Specific Gravity, Urine 1.013 1.005 - 1.030   pH 5.0 5.0 - 8.0   Glucose, UA NEGATIVE NEGATIVE mg/dL   Hgb urine dipstick NEGATIVE NEGATIVE   Bilirubin Urine NEGATIVE NEGATIVE   Ketones, ur NEGATIVE NEGATIVE mg/dL   Protein, ur NEGATIVE NEGATIVE mg/dL   Nitrite NEGATIVE NEGATIVE   Leukocytes,Ua NEGATIVE NEGATIVE  Lipase, blood  Result Value Ref Range   Lipase 41 11 - 51 U/L       Assessment & Plan:     ICD-10-CM   1. Rash and nonspecific skin eruption  R21  doxycycline (VIBRA-TABS) 100 MG tablet   local skin reaction - not concerning for tick born illness, expect localized sx to persist for up to several weeks, topical benadryl or cortisone ok, monitor    2. Tick bite of axillary region, left, initial encounter  S40.862A doxycycline (VIBRA-TABS) 100 MG tablet   W57.XXXA    removed, not engorged, not embedded - unlikely to have transmitted tick born illness, monitor for sx, can start doxy if she becomes sx    3. Moderate episode of recurrent major depressive disorder (HCC)  F33.1    mood much worse with daughter out of town and pt more isolated, she refuses to change medication or add meds    4. Chronic obstructive pulmonary disease, unspecified COPD type ([place])  J44.9    SOB  limiting any walking/exercise, not using inhaler daily - encouraged daily compliance to help limit DOE, she should have annual pulm f/up soon     Return in about 3 months (around 02/08/2022) for Routine follow-up.   Pt's handicap application was redone - I previously signs for 6 month and pt requests and is rightly qualified for permanent handicap placard - done today      Delsa Grana, PA-C 11/08/21 11:49 AM

## 2021-11-08 NOTE — Progress Notes (Signed)
ICD-10-CM   1. No-show for appointment  Z91.199    appt time changed to tomorrow because of transportation

## 2021-11-22 DIAGNOSIS — S22060A Wedge compression fracture of T7-T8 vertebra, initial encounter for closed fracture: Secondary | ICD-10-CM | POA: Diagnosis not present

## 2021-11-22 DIAGNOSIS — I700 Atherosclerosis of aorta: Secondary | ICD-10-CM | POA: Diagnosis not present

## 2021-11-22 DIAGNOSIS — S22070D Wedge compression fracture of T9-T10 vertebra, subsequent encounter for fracture with routine healing: Secondary | ICD-10-CM | POA: Diagnosis not present

## 2021-11-22 DIAGNOSIS — S22070A Wedge compression fracture of T9-T10 vertebra, initial encounter for closed fracture: Secondary | ICD-10-CM | POA: Diagnosis not present

## 2021-12-26 ENCOUNTER — Telehealth: Payer: Self-pay | Admitting: Pulmonary Disease

## 2021-12-26 NOTE — Telephone Encounter (Signed)
Spoke to patient.  She is requesting POC. According to our records, patient has nocturnal oxygen only.  She is aware that a walk test is needed.  Qualifying walk test scheduled 12/28/2021. Nothing further needed.

## 2021-12-28 ENCOUNTER — Ambulatory Visit (INDEPENDENT_AMBULATORY_CARE_PROVIDER_SITE_OTHER): Payer: Medicare Other

## 2021-12-28 DIAGNOSIS — R0609 Other forms of dyspnea: Secondary | ICD-10-CM

## 2021-12-28 NOTE — Progress Notes (Signed)
Patient in office for qualifying walk.

## 2021-12-29 NOTE — Progress Notes (Unsigned)
Cardiology Office Note    Date:  01/02/2022   ID:  Christine, Burnett 1940-08-23, MRN [id number]  PCP:  Delsa Grana, PA-C  Cardiologist:  Kate Sable, MD  Electrophysiologist:  None   Chief Complaint: Follow-up  History of Present Illness:   Christine Burnett is a 81 y.o. female with history of nonobstructive CAD by [place] on 04/2021, PSVT, mild aortic valve stenosis, TIA, HLD, non-small cell lung cancer s/p SBRT in 08/2020, CKD stage IV, COPD, compression fracture, and prior tobacco use who presents for follow-up of nonobstructive CAD and aortic stenosis  Nuclear stress test in 03/2019 showed no evidence of ischemia and was low risk with an LVEF greater than 65%.  Outpatient cardiac monitoring in 04/2019 demonstrated a predominant rhythm of sinus with 1 run of NSVT lasting 11 beats, 23 episodes of SVT with the longest interval lasting 42.7 seconds.  Echo in 01/2021 demonstrated an EF of 60 to 49%, grade 1 diastolic dysfunction, normal RV systolic function and ventricular cavity size, normal PASP, mild to moderate mitral valve regurgitation, mild aortic valve stenosis, and an estimated right atrial pressure of 3 mmHg.  Prior CT imaging of the chest demonstrated significant LAD and RCA calcifications.  In the context of chest pain, she underwent LHC in 04/2021 demonstrated moderately to severely calcified coronary arteries with overall mild to moderate nonobstructive disease affecting the main vessels.  There was a 70% stenosis in the superior branch of a large first diagonal.  Mildly elevated LVEDP at 20 mmHg.  Medical therapy was recommended with PCI of the diagonal to be considered if she had refractory angina despite optimization of antianginal therapy.  She was last seen in the office in 06/2021 and was without symptoms of angina or decompensation.  She did note dizziness with blood pressure soft at 87/64.  Imdur was reduced to 15 mg daily.  She comes in today accompanied by her  daughter.  Overall, she is doing well.  She continues to note chronic stable exertional dyspnea and chest heaviness that is unchanged when compared to how she was feeling in late 2022 leading up to her cardiac cath.  She also notes the symptoms when laying supine.  She underwent ambulatory oxygen testing with pulmonology on 7/26 which was normal per her report.  She does continue to note some positional dizziness and has been without symptoms of frank syncope.  No lower extremity swelling, abdominal distention, PND, early satiety, or orthopnea.  Her weight is down 7 pounds today when compared to her last clinic visit.  No falls, hematochezia, melena, hemoptysis, hematuria, or hematemesis.  She does report having a history of a "clot in my belly."  She does not recall the specifics of this.  Upon chart biopsy, I did discover in 2016 she was noted to be on warfarin with neurology note at that time indicating it had been started 20 years prior, possibly given history of TIA?   Labs independently reviewed: 09/2021 - potassium 4.1, BUN 31, SCr 1.66, albumin 3.8, AST/ALT normal, HGB 12.3, PLT 242 03/2021 - TC 113, TG 93, HDL 45, LDL 50 08/2018 - TSH normal, A1c 5.7  Past Medical History:  Diagnosis Date   Allergic rhinitis    Anxiety    Arthritis Me   Chronic bronchitis (HCC)    Chronic kidney disease, stage III (moderate) ([place]) 08/31/2017   Seeing nephrologist   CKD (chronic kidney disease), stage III (HCC)    COPD (chronic obstructive pulmonary disease) ([place])  Cornea scar    Depression    unspecified   Diverticulosis    Emphysema of lung ([place]) Me   Hx of fracture of left hip 12/24/2017   Hyperlipidemia    Hyperparathyroidism, 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   Managed by nephrologist   Non-small cell lung cancer (NSCLC) ([place])    Osteoporosis    Oxygen deficiency Me   Risk for falls    Situational disturbance    Stroke [facility]) 2011   tia's x 2   TIA (transient ischemic attack)     Past  Surgical History:  Procedure Laterality Date   COLONOSCOPY  2016   ESOPHAGUS SURGERY     INTRAMEDULLARY (IM) NAIL INTERTROCHANTERIC Left 03/26/2017   Procedure: INTRAMEDULLARY (IM) NAIL INTERTROCHANTRIC;  Surgeon: Hessie Knows, MD;  Location: [facility] ORS;  Service: Orthopedics;  Laterality: Left;   LEFT HEART CATH AND CORONARY ANGIOGRAPHY N/A 04/15/2021   Procedure: LEFT HEART CATH AND CORONARY ANGIOGRAPHY;  Surgeon: Wellington Hampshire, MD;  Location: [facility];  Service: Cardiovascular;  Laterality: N/A;   VIDEO BRONCHOSCOPY WITH ENDOBRONCHIAL NAVIGATION Right 06/28/2020   Procedure: VIDEO BRONCHOSCOPY WITH ENDOBRONCHIAL NAVIGATION CELLVIZIO;  Surgeon: Tyler Pita, MD;  Location: [facility] ORS;  Service: Pulmonary;  Laterality: Right;    Current Medications: Current Meds  Medication Sig   acetaminophen (TYLENOL) 650 MG CR tablet Take 1 tablet (650 mg total) by mouth 4 (four) times daily as needed for pain.   albuterol (VENTOLIN HFA) 108 (90 Base) MCG/ACT inhaler Inhale 2 puffs into the lungs every 6 (six) hours as needed for wheezing or shortness of breath.   aspirin 81 MG EC tablet Take 81 mg by mouth at bedtime. Swallow whole.   atorvastatin (LIPITOR) 40 MG tablet Take 1 tablet (40 mg total) by mouth at bedtime.   Budeson-Glycopyrrol-Formoterol (BREZTRI AEROSPHERE) 160-9-4.8 MCG/ACT AERO Inhale 2 puffs into the lungs in the morning and at bedtime. (Patient taking differently: Inhale 2 puffs into the lungs 2 (two) times daily as needed (respiratory issues.).)   Calcium 200 MG TABS Take 300 mg by mouth every Monday, Tuesday, Wednesday, Thursday, and Friday.   Cholecalciferol (VITAMIN D3) 50 MCG (2000 UT) TABS Take 2,000 Units by mouth at bedtime.   isosorbide mononitrate (IMDUR) 30 MG 24 hr tablet Take 0.5 tablets (15 mg total) by mouth daily.   lidocaine (LIDODERM) 5 % Place 1 patch onto the skin daily. Remove & Discard patch within 12 hours or as directed by MD   melatonin 3 MG  TABS tablet Take 3 mg by mouth at bedtime.   PARoxetine (PAXIL) 40 MG tablet Take 1 tablet (40 mg total) by mouth every morning.   Probiotic Product (PROBIOTIC PO) Take 1 capsule by mouth at bedtime.   Spacer/Aero-Holding Chambers DEVI USE AS DIRECTED    Allergies:   Zithromax [azithromycin], Cefuroxime axetil, Ciprofloxacin, and Codeine   Social History   Socioeconomic History   Marital status: Divorced    Spouse name: Not on file   Number of children: 4   Years of education: Not on file   Highest education level: 12th grade  Occupational History   Occupation: Retired    Comment: payroll  Tobacco Use   Smoking status: Former    Packs/day: 1.00    Years: 61.00    Total pack years: 61.00    Types: Cigarettes    Quit date: 03/25/2017    Years since quitting: 4.7   Smokeless tobacco: Never  Vaping Use   Vaping  Use: Never used  Substance and Sexual Activity   Alcohol use: No   Drug use: No   Sexual activity: Not Currently  Other Topics Concern   Not on file  Social History Narrative   Full Code   4 children   Denies alcohol use   Patient has 1 daughters and 1 grandson living with her   Social Determinants of Health   Financial Resource Strain: Low Risk  (02/08/2021)   Overall Financial Resource Strain (CARDIA)    Difficulty of Paying Living Expenses: Not hard at all  Food Insecurity: No Food Insecurity (02/08/2021)   Hunger Vital Sign    Worried About Running Out of Food in the Last Year: Never true    Moody in the Last Year: Never true  Transportation Needs: No Transportation Needs (02/08/2021)   PRAPARE - Hydrologist (Medical): No    Lack of Transportation (Non-Medical): No  Physical Activity: Inactive (02/08/2021)   Exercise Vital Sign    Days of Exercise per Week: 0 days    Minutes of Exercise per Session: 0 min  Stress: Stress Concern Present (02/08/2021)   [place]    Feeling of Stress : Rather much  Social Connections: Moderately Isolated (02/08/2021)   Social Connection and Isolation Panel [NHANES]    Frequency of Communication with Friends and Family: More than three times a week    Frequency of Social Gatherings with Friends and Family: More than three times a week    Attends Religious Services: More than 4 times per year    Active Member of [employer] or Organizations: No    Attends Music therapist: Never    Marital Status: Divorced     Family History:  The patient's family history includes Cancer in her father; Diabetes in her brother; Heart attack in her brother and father; Heart disease in her father; Hypertension in her daughter; Kidney disease in her mother; Other in her mother; Prostate cancer in her father. There is no history of Kidney cancer, Bladder Cancer, Colon cancer, Colon polyps, or Rectal cancer.  ROS:   12-point review of systems is negative unless otherwise noted in the HPI.   EKGs/Labs/Other Studies Reviewed:    Studies reviewed were summarized above. The additional studies were reviewed today:  LHC 04/15/2021:   Ost RCA to Prox RCA lesion is 30% stenosed.   Dist RCA lesion is 40% stenosed.   1st Mrg lesion is 50% stenosed.   Mid LAD lesion is 40% stenosed.   1st Diag lesion is 70% stenosed.   1.  Moderately to severely calcified coronary arteries.  Overall mild to moderate nonobstructive disease affecting the main vessels.  There is a 70% stenosis in the superior branch of a large first diagonal. 2.  Left ventricular angiography was not performed due to chronic kidney disease.  EF was normal by echo. 3.  Mildly elevated left ventricular end-diastolic pressure at 20 mmHg.   Recommendations: Recommend attempted medical therapy and optimizing blood pressure.  I added Imdur 30 mg daily.  Continue treatment of risk factors.  PCI of the diagonal can be considered but only if she has refractory angina in  spite of medical therapy. __________  2D echo 01/25/2021: 1. Left ventricular ejection fraction, by estimation, is 60 to 65%. The  left ventricle has normal function. Left ventricular endocardial border  not optimally defined to evaluate regional wall motion. Left  ventricular  diastolic parameters are consistent  with Grade I diastolic dysfunction (impaired relaxation). Elevated left  atrial pressure.   2. Right ventricular systolic function is normal. The right ventricular  size is normal. There is normal pulmonary artery systolic pressure.   3. The mitral valve is grossly normal. Mild to moderate mitral valve  regurgitation. No evidence of mitral stenosis.   4. The aortic valve was not well visualized. Aortic valve regurgitation  is not visualized. Mild aortic valve stenosis.   5. The inferior vena cava is normal in size with greater than 50%  respiratory variability, suggesting right atrial pressure of 3 mmHg. __________  Elwyn Reach patch 04/2019: Patient had a min HR of 53 bpm, max HR of 187 bpm, and avg HR of 88 bpm. Predominant underlying rhythm was Sinus Rhythm. 1 run of Ventricular Tachycardia occurred lasting 11 beats with a max rate of 148 bpm (avg 130 bpm). 23 Supraventricular Tachycardia runs occurred, the run with the fastest interval lasting 7 beats with a max rate of 187 bpm, the longest lasting 42.7 secs with an avg rate of 124 bpm. Isolated SVEs were rare (<1.0%), SVE Couplets were rare (<1.0%), and SVE Triplets were rare (<1.0%). Isolated VEs were rare (<1.0%), 1 patient triggered event was noted associated with sinus rhythm. ___________  Carlton Adam MPI 03/31/2019: The study is normal. This is a low risk study. The left ventricular ejection fraction is hyperdynamic (>65%). ____________  2D echo 03/27/2017: - Left ventricle: The cavity size was normal. Wall thickness was    increased in a pattern of mild LVH. Systolic function was    vigorous. The estimated ejection  fraction was in the range of 65%    to 70%. Wall motion was normal; there were no regional wall    motion abnormalities. Doppler parameters are consistent with    abnormal left ventricular relaxation (grade 1 diastolic    dysfunction).  - Right ventricle: The cavity size was normal. Systolic function    was normal.  - Pulmonary arteries: Systolic pressure was mildly increased, in    the range of 35 mm Hg to 40 mm Hg.   EKG:  EKG is ordered today.  The EKG ordered today demonstrates sinus tachycardia, 103 bpm, no acute ST-T changes, consistent with prior tracing  Recent Labs: 09/24/2021: ALT 15; BUN 31; Creatinine, Ser 1.66; Hemoglobin 12.3; Platelets 242; Potassium 4.1; Sodium 138  Recent Lipid Panel    Component Value Date/Time   CHOL 113 03/23/2021 1012   CHOL 144 07/03/2014 0526   TRIG 93 03/23/2021 1012   TRIG 320 (H) 07/03/2014 0526   HDL 45 03/23/2021 1012   HDL 33 (L) 07/03/2014 0526   CHOLHDL 2.5 03/23/2021 1012   CHOLHDL 3.0 01/29/2020 1515   VLDL 37 (H) 11/10/2016 1531   VLDL 64 (H) 07/03/2014 0526   LDLCALC 50 03/23/2021 1012   LDLCALC 73 01/29/2020 1515   LDLCALC 47 07/03/2014 0526    PHYSICAL EXAM:    VS:  BP 120/90 (BP Location: Left Arm, Patient Position: Sitting, Cuff Size: Normal)   Pulse (!) 103   Ht 5\' 7"  (1.702 m)   Wt 139 lb 2 oz (63.1 kg)   SpO2 96%   BMI 21.79 kg/m   BMI: Body mass index is 21.79 kg/m.  Physical Exam Vitals reviewed.  Constitutional:      Appearance: She is well-developed.  HENT:     Head: Normocephalic and atraumatic.  Eyes:     General:  Right eye: No discharge.        Left eye: No discharge.  Neck:     Vascular: No JVD.  Cardiovascular:     Rate and Rhythm: Normal rate and regular rhythm.     Pulses:          Posterior tibial pulses are 2+ on the right side and 2+ on the left side.     Heart sounds: S1 normal and S2 normal. Heart sounds not distant. No midsystolic click and no opening snap. Murmur heard.      Systolic murmur is present with a grade of 1/6 at the upper right sternal border.     No friction rub.  Pulmonary:     Effort: Pulmonary effort is normal. No respiratory distress.     Breath sounds: Normal breath sounds. No decreased breath sounds, wheezing or rales.  Chest:     Chest wall: No tenderness.  Abdominal:     General: There is no distension.  Musculoskeletal:     Cervical back: Normal range of motion.     Right lower leg: No edema.     Left lower leg: No edema.  Skin:    General: Skin is warm and dry.     Nails: There is no clubbing.  Neurological:     Mental Status: She is alert and oriented to person, place, and time.  Psychiatric:        Speech: Speech normal.        Behavior: Behavior normal.        Thought Content: Thought content normal.        Judgment: Judgment normal.     Wt Readings from Last 3 Encounters:  01/02/22 139 lb 2 oz (63.1 kg)  11/08/21 146 lb 14.4 oz (66.6 kg)  08/26/21 144 lb 12.8 oz (65.7 kg)     ASSESSMENT & PLAN:   Nonobstructive CAD with chronic exertional dyspnea: She continues to note chronic stable exertional dyspnea that dates back at least to late 2022, with LHC less than 12 months ago showed nonobstructive disease involving the major epicardial arteries with 70% stenosis noted along a superior branch of D1 with medical management recommended.  Prior PFTs have demonstrated mild COPD.  I suspect her dyspnea is multifactorial in etiology including underlying CAD, COPD, aortic stenosis, and a degree of physical deconditioning.  Echo in 01/2021 demonstrated normal PA pressures.  We will update an echo to reevaluate her aortic valve stenosis and PA pressures.  If symptoms persist, or if dictated by echo, could consider RHC to further assess her hemodynamics.  Continue aggressive risk factor modification and primary prevention including aspirin, atorvastatin, and isosorbide mononitrate.  Paroxysmal SVT: Noted on prior cardiac monitoring.   Quiescent.  No longer on a beta-blocker secondary to orthostasis.  Aortic valve stenosis: Mild on echo in 01/2021 with a mean gradient of 9.1 and valve area 1.61 cm.  Update echo.  HLD: LDL 50 in 03/2021.  She remains on atorvastatin 40 mg daily.  CKD stage IV: Followed by nephrology.  Avoid nephrotoxic agents.  History of TIA: Previously on warfarin?  No objective evidence of A-fib available for review upon chart biopsy.  She remains on aspirin and atorvastatin.   Disposition: F/u with Dr. Garen Lah or an APP in 3 months.   Medication Adjustments/Labs and Tests Ordered: Current medicines are reviewed at length with the patient today.  Concerns regarding medicines are outlined above. Medication changes, Labs and Tests ordered today are summarized above and  listed in the Patient Instructions accessible in Encounters.   Signed, Christell Faith, PA-C 01/02/2022 3:49 PM     [address] [place], [place] [address] [phone]

## 2022-01-02 ENCOUNTER — Ambulatory Visit (INDEPENDENT_AMBULATORY_CARE_PROVIDER_SITE_OTHER): Payer: Medicare Other | Admitting: Physician Assistant

## 2022-01-02 ENCOUNTER — Encounter: Payer: Self-pay | Admitting: Physician Assistant

## 2022-01-02 VITALS — BP 120/90 | HR 103 | Ht 67.0 in | Wt 139.1 lb

## 2022-01-02 DIAGNOSIS — I471 Supraventricular tachycardia, unspecified: Secondary | ICD-10-CM

## 2022-01-02 DIAGNOSIS — E785 Hyperlipidemia, unspecified: Secondary | ICD-10-CM | POA: Diagnosis not present

## 2022-01-02 DIAGNOSIS — Z8673 Personal history of transient ischemic attack (TIA), and cerebral infarction without residual deficits: Secondary | ICD-10-CM | POA: Diagnosis not present

## 2022-01-02 DIAGNOSIS — R0609 Other forms of dyspnea: Secondary | ICD-10-CM | POA: Diagnosis not present

## 2022-01-02 DIAGNOSIS — I2510 Atherosclerotic heart disease of native coronary artery without angina pectoris: Secondary | ICD-10-CM | POA: Diagnosis not present

## 2022-01-02 DIAGNOSIS — N184 Chronic kidney disease, stage 4 (severe): Secondary | ICD-10-CM | POA: Diagnosis not present

## 2022-01-02 DIAGNOSIS — I350 Nonrheumatic aortic (valve) stenosis: Secondary | ICD-10-CM | POA: Diagnosis not present

## 2022-01-02 NOTE — Patient Instructions (Signed)
Medication Instructions:   Your physician recommends that you continue on your current medications as directed. Please refer to the Current Medication list given to you today.  *If you need a refill on your cardiac medications before your next appointment, please call your pharmacy*   Lab Work:  None ordered  Testing/Procedures:  Your physician has requested that you have an echocardiogram. Echocardiography is a painless test that uses sound waves to create images of your heart. It provides your doctor with information about the size and shape of your heart and how well your heart's chambers and valves are working. This procedure takes approximately one hour. There are no restrictions for this procedure.   Follow-Up: At [facility], you and your health needs are our priority.  As part of our continuing mission to provide you with exceptional heart care, we have created designated Provider Care Teams.  These Care Teams include your primary Cardiologist (physician) and Advanced Practice Providers (APPs -  Physician Assistants and Nurse Practitioners) who all work together to provide you with the care you need, when you need it.  We recommend signing up for the patient portal called "MyChart".  Sign up information is provided on this After Visit Summary.  MyChart is used to connect with patients for Virtual Visits (Telemedicine).  Patients are able to view lab/test results, encounter notes, upcoming appointments, etc.  Non-urgent messages can be sent to your provider as well.   To learn more about what you can do with MyChart, go to [email or web address].    Your next appointment:   3 month(s)  The format for your next appointment:   In Person  Provider:   Christell Faith, PA-C  Important Information About Sugar

## 2022-01-10 ENCOUNTER — Other Ambulatory Visit: Payer: Self-pay | Admitting: *Deleted

## 2022-01-10 NOTE — Patient Outreach (Signed)
  Care Coordination   Initial Visit Note   01/10/2022 Name: SUMMERLYNN GLAUSER MRN: [id number] DOB: [birth year]  LOVELEE FORNER is a 81 y.o. year old female who sees Delsa Grana, [place] for primary care. I spoke with  [name] by phone today  RN discussed the services that [facility] has to offer, RN, SW and Pharmacy. Patient declined service  SDOH assessments and interventions completed:  No     Care Coordination Interventions Activated:  Yes  Care Coordination Interventions:  No, not indicated   Follow up plan: No further intervention required.   Encounter Outcome:  Pt. [place] Care Management [phone]

## 2022-01-19 ENCOUNTER — Ambulatory Visit
Admission: RE | Admit: 2022-01-19 | Discharge: 2022-01-19 | Disposition: A | Discharge status: Home / Self Care | Payer: Medicare Other | Source: Ambulatory Visit | Attending: Oncology | Admitting: Oncology

## 2022-01-19 DIAGNOSIS — Z08 Encounter for follow-up examination after completed treatment for malignant neoplasm: Secondary | ICD-10-CM | POA: Insufficient documentation

## 2022-01-19 DIAGNOSIS — J439 Emphysema, unspecified: Secondary | ICD-10-CM | POA: Diagnosis not present

## 2022-01-19 DIAGNOSIS — J9811 Atelectasis: Secondary | ICD-10-CM | POA: Diagnosis not present

## 2022-01-19 DIAGNOSIS — Z85118 Personal history of other malignant neoplasm of bronchus and lung: Secondary | ICD-10-CM | POA: Insufficient documentation

## 2022-01-23 ENCOUNTER — Other Ambulatory Visit: Payer: Self-pay | Admitting: Oncology

## 2022-01-23 NOTE — Progress Notes (Signed)
umor

## 2022-01-25 ENCOUNTER — Inpatient Hospital Stay (HOSPITAL_BASED_OUTPATIENT_CLINIC_OR_DEPARTMENT_OTHER): Payer: Medicare Other | Admitting: Oncology

## 2022-01-25 ENCOUNTER — Encounter: Payer: Self-pay | Admitting: Oncology

## 2022-01-25 ENCOUNTER — Telehealth: Payer: Self-pay

## 2022-01-25 ENCOUNTER — Inpatient Hospital Stay: Payer: Medicare Other | Attending: Oncology

## 2022-01-25 VITALS — BP 129/80 | HR 100 | Temp 98.2°F | Resp 16 | Ht 67.0 in | Wt 139.4 lb

## 2022-01-25 DIAGNOSIS — Z08 Encounter for follow-up examination after completed treatment for malignant neoplasm: Secondary | ICD-10-CM | POA: Diagnosis not present

## 2022-01-25 DIAGNOSIS — C3490 Malignant neoplasm of unspecified part of unspecified bronchus or lung: Secondary | ICD-10-CM

## 2022-01-25 DIAGNOSIS — Z85118 Personal history of other malignant neoplasm of bronchus and lung: Secondary | ICD-10-CM

## 2022-01-25 LAB — CBC WITH DIFFERENTIAL/PLATELET
Abs Immature Granulocytes: 0.02 10*3/uL (ref 0.00–0.07)
Basophils Absolute: 0 10*3/uL (ref 0.0–0.1)
Basophils Relative: 1 %
Eosinophils Absolute: 0.2 10*3/uL (ref 0.0–0.5)
Eosinophils Relative: 4 %
HCT: 36.2 % (ref 36.0–46.0)
Hemoglobin: 11.9 g/dL — ABNORMAL LOW (ref 12.0–15.0)
Immature Granulocytes: 0 %
Lymphocytes Relative: 18 %
Lymphs Abs: 1.2 10*3/uL (ref 0.7–4.0)
MCH: 33.3 pg (ref 26.0–34.0)
MCHC: 32.9 g/dL (ref 30.0–36.0)
MCV: 101.4 fL — ABNORMAL HIGH (ref 80.0–100.0)
Monocytes Absolute: 0.4 10*3/uL (ref 0.1–1.0)
Monocytes Relative: 7 %
Neutro Abs: 4.4 10*3/uL (ref 1.7–7.7)
Neutrophils Relative %: 70 %
Platelets: 188 10*3/uL (ref 150–400)
RBC: 3.57 MIL/uL — ABNORMAL LOW (ref 3.87–5.11)
RDW: 13.5 % (ref 11.5–15.5)
WBC: 6.2 10*3/uL (ref 4.0–10.5)
nRBC: 0 % (ref 0.0–0.2)

## 2022-01-25 LAB — COMPREHENSIVE METABOLIC PANEL
ALT: 14 U/L (ref 0–44)
AST: 21 U/L (ref 15–41)
Albumin: 3.8 g/dL (ref 3.5–5.0)
Alkaline Phosphatase: 97 U/L (ref 38–126)
Anion gap: 8 (ref 5–15)
BUN: 24 mg/dL — ABNORMAL HIGH (ref 8–23)
CO2: 25 mmol/L (ref 22–32)
Calcium: 9.4 mg/dL (ref 8.9–10.3)
Chloride: 105 mmol/L (ref 98–111)
Creatinine, Ser: 1.48 mg/dL — ABNORMAL HIGH (ref 0.44–1.00)
GFR, Estimated: 35 mL/min — ABNORMAL LOW (ref >60)
Glucose, Bld: 125 mg/dL — ABNORMAL HIGH (ref 70–99)
Potassium: 4.4 mmol/L (ref 3.5–5.1)
Sodium: 138 mmol/L (ref 135–145)
Total Bilirubin: 0.5 mg/dL (ref 0.3–1.2)
Total Protein: 7.5 g/dL (ref 6.5–8.1)

## 2022-01-25 NOTE — Progress Notes (Unsigned)
Pt says that she feels that sob on exertion is from heart issues and she has appt tom.

## 2022-01-25 NOTE — Telephone Encounter (Signed)
Received epic secure message from Dr. Patsey Berthold- patient needs f/u appt.  Lm for patient to schedule.

## 2022-01-26 ENCOUNTER — Other Ambulatory Visit: Payer: Medicare Other

## 2022-01-26 ENCOUNTER — Ambulatory Visit (INDEPENDENT_AMBULATORY_CARE_PROVIDER_SITE_OTHER): Payer: Medicare Other

## 2022-01-26 ENCOUNTER — Telehealth: Payer: Self-pay | Admitting: *Deleted

## 2022-01-26 DIAGNOSIS — R0609 Other forms of dyspnea: Secondary | ICD-10-CM | POA: Diagnosis not present

## 2022-01-26 DIAGNOSIS — I350 Nonrheumatic aortic (valve) stenosis: Secondary | ICD-10-CM | POA: Diagnosis not present

## 2022-01-26 LAB — ECHOCARDIOGRAM COMPLETE
AR max vel: 2.15 cm2
AV Area VTI: 2.03 cm2
AV Area mean vel: 2.05 cm2
AV Mean grad: 4.5 mmHg
AV Peak grad: 7.4 mmHg
Ao pk vel: 1.36 m/s
Area-P 1/2: 3.72 cm2
Calc EF: 51.1 %
S' Lateral: 2.2 cm
Single Plane A2C EF: 51.3 %
Single Plane A4C EF: 53.2 %

## 2022-01-26 NOTE — Telephone Encounter (Signed)
Yes, that will be fine.

## 2022-01-26 NOTE — Telephone Encounter (Signed)
-----   Message from Rise Mu, PA-C sent at 01/26/2022  4:34 PM EDT ----- Echo showed normal pump function, normal wall motion, mild thickening and slight stiffening of the heart, mild to moderately leaky mitral valve, aortic valve sclerosis without evidence of stenosis, and normal right atrial pressure.  Overall, stable echo when compared to prior.

## 2022-01-26 NOTE — Telephone Encounter (Signed)
Lm for patient to offer appt 02/08/2022 at 4:00.

## 2022-01-26 NOTE — Progress Notes (Signed)
Hematology/Oncology Consult note [facility]  [phone] [phone]  Patient Care Team: Delsa Grana, PA-C as PCP - General (Family Medicine) Kate Sable, MD as PCP - Cardiology (Cardiology) Anthonette Legato, MD as Consulting Physician (Nephrology) Telford Nab, RN as Oncology Nurse Navigator Tyler Pita, MD as Consulting Physician (Pulmonary Disease) Sindy Guadeloupe, MD as Consulting Physician (Oncology) Noreene Filbert, MD as Referring Physician (Radiation Oncology)   Name of the patient: Christine Burnett  [id number]  01/22/1941   Date of visit: 01/26/22  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  It showed a 4 x 3.5 cm lobulated and spiculated mass in the right lower lobe surrounding the right lower lobe bronchial tree.  Associated soft tissue within the bronchial tree which may represent local ingrowth.  This was followed by a bronchoscopy and bronchoalveolar lavage from the right lower lobe was consistent with non-small cell lung cancer.  Neoplastic cells were negative for p40 TTF-1 and CD56.    Further characterization between squamous and adenocarcinoma could not be obtained.   PET CT scan showed a 4.2 cm right lower lobe lung mass with an SUV of 10.  No hypermetabolic hilar or mediastinal adenopathy.  No evidence of distant metastatic disease.  Tiny nodule at the minor fissure of the right chest potential on follow-up.  Mild aneurysmal abdominal aorta recommend follow-up every 3 years.  Patient received SBRT to the lesion sometime in March 2022.  Interval history-patient has been having ongoing intermittent midsternal chest pain.  She follows up with cardiology and has been told she may need  cardiac catheterization at some point.  She is seeing cardiology tomorrow and will be getting echocardiogram as well.  ECOG PS- 1 Pain scale-0  Review of systems- Review of Systems  Constitutional:  Negative for chills, fever, malaise/fatigue and weight loss.  HENT:  Negative for congestion, ear discharge and nosebleeds.   Eyes:  Negative for blurred vision.  Respiratory:  Negative for cough, hemoptysis, sputum production, shortness of breath and wheezing.   Cardiovascular:  Positive for chest pain. Negative for palpitations, orthopnea and claudication.  Gastrointestinal:  Negative for abdominal pain, blood in stool, constipation, diarrhea, heartburn, melena, nausea and vomiting.  Genitourinary:  Negative for dysuria, flank pain, frequency, hematuria and urgency.  Musculoskeletal:  Negative for back pain, joint pain and myalgias.  Skin:  Negative for rash.  Neurological:  Negative for dizziness, tingling, focal weakness, seizures, weakness and headaches.  Endo/Heme/Allergies:  Does not bruise/bleed easily.  Psychiatric/Behavioral:  Negative for depression and suicidal ideas. The patient does not have insomnia.       Allergies  Allergen Reactions   Zithromax [Azithromycin]    Cefuroxime Axetil Rash   Ciprofloxacin Nausea Only    Unknown   Codeine Other (See Comments)    Patient does not like how the medication makes you feel      Past Medical History:  Diagnosis Date   Allergic rhinitis    Anxiety    Arthritis Me   Chronic bronchitis (HCC)    Chronic kidney disease, stage III (moderate) ([place]) 08/31/2017   Seeing nephrologist   CKD (chronic kidney disease), stage III (HCC)    COPD (chronic obstructive pulmonary disease) (HCC)    Cornea scar    Depression  unspecified   Diverticulosis    Emphysema of lung ([place]) Me   Hx of fracture of left hip 12/24/2017   Hyperlipidemia    Hyperparathyroidism, 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   Managed by nephrologist   Non-small  cell lung cancer (NSCLC) ([place])    Osteoporosis    Oxygen deficiency Me   Risk for falls    Situational disturbance    Stroke [facility]) 2011   tia's x 2   TIA (transient ischemic attack)      Past Surgical History:  Procedure Laterality Date   COLONOSCOPY  2016   ESOPHAGUS SURGERY     INTRAMEDULLARY (IM) NAIL INTERTROCHANTERIC Left 03/26/2017   Procedure: INTRAMEDULLARY (IM) NAIL INTERTROCHANTRIC;  Surgeon: Hessie Knows, MD;  Location: [facility] ORS;  Service: Orthopedics;  Laterality: Left;   LEFT HEART CATH AND CORONARY ANGIOGRAPHY N/A 04/15/2021   Procedure: LEFT HEART CATH AND CORONARY ANGIOGRAPHY;  Surgeon: Wellington Hampshire, MD;  Location: [place] [facility];  Service: Cardiovascular;  Laterality: N/A;   VIDEO BRONCHOSCOPY WITH ENDOBRONCHIAL NAVIGATION Right 06/28/2020   Procedure: VIDEO BRONCHOSCOPY WITH ENDOBRONCHIAL NAVIGATION CELLVIZIO;  Surgeon: Tyler Pita, MD;  Location: [facility] ORS;  Service: Pulmonary;  Laterality: Right;    Social History   Socioeconomic History   Marital status: Divorced    Spouse name: Not on file   Number of children: 4   Years of education: Not on file   Highest education level: 12th grade  Occupational History   Occupation: Retired    Comment: payroll  Tobacco Use   Smoking status: Former    Packs/day: 1.00    Years: 61.00    Total pack years: 61.00    Types: Cigarettes    Quit date: 03/25/2017    Years since quitting: 4.8   Smokeless tobacco: Never  Vaping Use   Vaping Use: Never used  Substance and Sexual Activity   Alcohol use: No   Drug use: No   Sexual activity: Not Currently  Other Topics Concern   Not on file  Social History Narrative   Full Code   4 children   Denies alcohol use   Patient has 1 daughters and 1 grandson living with her   Social Determinants of Health   Financial Resource Strain: [place]  (02/08/2021)   Overall Financial Resource Strain (CARDIA)    Difficulty of Paying Living Expenses: Not hard at  all  Food Insecurity: No Food Insecurity (02/08/2021)   Hunger Vital Sign    Worried About Running Out of Food in the Last Year: Never true    [place] in the Last Year: Never true  Transportation Needs: No Transportation Needs (02/08/2021)   PRAPARE - Hydrologist (Medical): No    Lack of Transportation (Non-Medical): No  Physical Activity: Inactive (02/08/2021)   Exercise Vital Sign    Days of Exercise per Week: 0 days    Minutes of Exercise per Session: 0 min  Stress: Stress Concern Present (02/08/2021)   [place]    Feeling of Stress : Rather much  Social Connections: Moderately Isolated (02/08/2021)   Social Connection and Isolation Panel [NHANES]    Frequency of Communication with Friends and Family: More than three times a week    Frequency of Social Gatherings with Friends and Family: More than three times a week    Attends Religious Services: More than 4 times per year    Active  Member of Clubs or Organizations: No    Attends Archivist Meetings: Never    Marital Status: Divorced  Human resources officer Violence: Not At Risk (02/08/2021)   Humiliation, Afraid, Rape, and Kick questionnaire    Fear of Current or Ex-Partner: No    Emotionally Abused: No    Physically Abused: No    Sexually Abused: No    Family History  Problem Relation Age of Onset   Other Mother        [place] posioning   Kidney disease Mother    Heart attack Father    Prostate cancer Father    Cancer Father    Heart disease Father    Heart attack Brother    Diabetes Brother    Hypertension Daughter    Kidney cancer Neg Hx    Bladder Cancer Neg Hx    Colon cancer Neg Hx    Colon polyps Neg Hx    Rectal cancer Neg Hx      Current Outpatient Medications:    acetaminophen (TYLENOL) 650 MG CR tablet, Take 1 tablet (650 mg total) by mouth 4 (four) times daily as needed for pain., Disp: , Rfl:     albuterol (VENTOLIN HFA) 108 (90 Base) MCG/ACT inhaler, Inhale 2 puffs into the lungs every 6 (six) hours as needed for wheezing or shortness of breath., Disp: 8 g, Rfl: 0   aspirin 81 MG EC tablet, Take 81 mg by mouth at bedtime. Swallow whole., Disp: , Rfl:    atorvastatin (LIPITOR) 40 MG tablet, Take 1 tablet (40 mg total) by mouth at bedtime., Disp: 90 tablet, Rfl: 3   Calcium 200 MG TABS, Take 300 mg by mouth every Monday, Tuesday, Wednesday, Thursday, and Friday., Disp: , Rfl:    Cholecalciferol (VITAMIN D3) 50 MCG (2000 UT) TABS, Take 2,000 Units by mouth at bedtime., Disp: , Rfl:    isosorbide mononitrate (IMDUR) 30 MG 24 hr tablet, Take 0.5 tablets (15 mg total) by mouth daily., Disp: 30 tablet, Rfl: 6   melatonin 3 MG TABS tablet, Take 3 mg by mouth at bedtime., Disp: , Rfl:    PARoxetine (PAXIL) 40 MG tablet, Take 1 tablet (40 mg total) by mouth every morning., Disp: 90 tablet, Rfl: 3   Probiotic Product (PROBIOTIC PO), Take 1 capsule by mouth at bedtime., Disp: , Rfl:    Budeson-Glycopyrrol-Formoterol (BREZTRI AEROSPHERE) 160-9-4.8 MCG/ACT AERO, Inhale 2 puffs into the lungs in the morning and at bedtime. (Patient not taking: Reported on 01/25/2022), Disp: 10.7 g, Rfl: 6   lidocaine (LIDODERM) 5 %, Place 1 patch onto the skin daily. Remove & Discard patch within 12 hours or as directed by MD (Patient not taking: Reported on 01/25/2022), Disp: 30 patch, Rfl: 0   Spacer/Aero-Holding Dorise Bullion, USE AS DIRECTED (Patient not taking: Reported on 01/25/2022), Disp: 1 each, Rfl: 0  Physical exam:  Vitals:   01/25/22 1424  BP: 129/80  Pulse: 100  Resp: 16  Temp: 98.2 F (36.8 C)  TempSrc: Oral  SpO2: 95%  Weight: 139 lb 6.4 oz (63.2 kg)  Height: _0  (1.702 m)   Physical Exam Constitutional:      General: She is not in acute distress. Cardiovascular:     Rate and Rhythm: Normal rate and regular rhythm.     Heart sounds: Normal heart sounds.  Pulmonary:     Effort: Pulmonary  effort is normal.     Breath sounds: Normal breath sounds.  Skin:    General:  Skin is warm and dry.  Neurological:     Mental Status: She is alert and oriented to person, place, and time.         Latest Ref Rng & Units 01/25/2022    1:40 PM  CMP  Glucose 70 - 99 mg/dL 125   BUN 8 - 23 mg/dL 24   Creatinine 0.44 - 1.00 mg/dL 1.48   Sodium 135 - 145 mmol/L 138   Potassium 3.5 - 5.1 mmol/L 4.4   Chloride 98 - 111 mmol/L 105   CO2 22 - 32 mmol/L 25   Calcium 8.9 - 10.3 mg/dL 9.4   Total Protein 6.5 - 8.1 g/dL 7.5   Total Bilirubin 0.3 - 1.2 mg/dL 0.5   Alkaline Phos 38 - 126 U/L 97   AST 15 - 41 U/L 21   ALT 0 - 44 U/L 14       Latest Ref Rng & Units 01/25/2022    1:40 PM  CBC  WBC 4.0 - 10.5 K/uL 6.2   Hemoglobin 12.0 - 15.0 g/dL 11.9   Hematocrit 36.0 - 46.0 % 36.2   Platelets 150 - 400 K/uL 188     No images are attached to the encounter.  CT Chest Wo Contrast  Result Date: 01/20/2022 CLINICAL DATA:  Non-small cell lung cancer; * Tracking Code: BO * EXAM: CT CHEST WITHOUT CONTRAST TECHNIQUE: Multidetector CT imaging of the chest was performed following the standard protocol without IV contrast. RADIATION DOSE REDUCTION: This exam was performed according to the departmental dose-optimization program which includes automated exposure control, adjustment of the mA and/or kV according to patient size and/or use of iterative reconstruction technique. COMPARISON:  Multiple priors, most recent chest CT dated July 19, 2021; CT of the cervical spine dated September 24, 2021 FINDINGS: Cardiovascular: Normal heart size. Stable trace pericardial fluid. Normal caliber thoracic aorta with severe calcified plaque. Severe left main and three-vessel coronary artery calcifications. Mediastinum/Nodes: Esophagus and thyroid are unremarkable. No pathologically enlarged lymph nodes seen in the chest. Lungs/Pleura: Spiculated right lower lobe mass measuring 1.8 x 1.6 cm on series 3 image 103 with  adjacent posttreatment changes, unchanged when compared with prior exam. Right hilar soft tissue causing persistent occlusion of the proximal basal right lower lobe bronchi and new complete occlusion of the proximal right middle lobe bronchi. Similar right lower lobe atelectasis and increased right middle lobe atelectasis. Moderate centrilobular emphysema. No pleural effusion or pneumothorax. Stable small 3 mm solid pulmonary nodule of the left lower lobe located on image 117. Upper Abdomen: Bilateral nonobstructing renal stones. No acute abnormality. Musculoskeletal: New severe age-indeterminate compression deformity of T7. severe T9 compression deformity and mild L1 compression deformities are unchanged when compared with prior CT dated September 24, 2021. IMPRESSION: 1. Stable spiculated nodule of the right lower lobe with adjacent posttreatment changes. 2. Right hilar soft tissue causing persistent occlusion of the proximal basal right lower lobe bronchi and new complete occlusion of the proximal right middle lobe bronchi. Given progression of bronchial occlusion, finding is concerning for progressive disease. Recommend tissue sampling or PET-CT for further evaluation. 3. No pathologically enlarged lymph nodes seen in the chest. 4. New severe age-indeterminate compression deformity of T7. Correlate for point tenderness. 5. Aortic Atherosclerosis (ICD10-I70.0) and Emphysema (ICD10-J43.9). Electronically Signed   By: Yetta Glassman M.D.   On: 01/20/2022 16:40     Assessment and plan- Patient is a 82 y.o. 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  Patient was noted to have some occlusion of her right lower lobe and middle lobe bronchi even in the past but there is further  progression of bronchial occlusion noted at this time concerning for progressive disease.  Although bronchoscopy would be the ideal next step patient has ongoing cardiac issues and may be in need of heart catheterization.  On current dose issues are resolved I will proceed with a PET CT scan at this time and refer the patient to Dr. Patsey Berthold.  Follow-up with me to be decided based on further discussion after PET scan   Visit Diagnosis 1. Encounter for follow-up surveillance of lung cancer      Dr. Randa Evens, MD, MPH [facility] [phone] 01/26/2022 10:48 AM

## 2022-01-26 NOTE — Telephone Encounter (Signed)
Left voicemail message to call back for review of results.  

## 2022-01-26 NOTE — Progress Notes (Signed)
Tumor Board Documentation  Christine Burnett was presented by Dr Janese Banks at our Tumor Board on 01/26/2022, which included representatives from medical oncology, surgical, pharmacy, pulmonology, radiology, genetics, pathology, navigation, radiation oncology, research, internal medicine, palliative care.  Christine Burnett currently presents as a current patient, for discussion with history of the following treatments: active survellience, surgical intervention(s), neoadjuvant radiation.  Additionally, we reviewed previous medical and familial history, history of present illness, and recent lab results along with all available histopathologic and imaging studies. The tumor board considered available treatment options and made the following recommendations: Additional screening (PET SCan and Re scope after Cardiac Clearance obtained)    The following procedures/referrals were also placed: No orders of the defined types were placed in this encounter.   Clinical Trial Status: not discussed   Staging used: Clinical Stage AJCC Staging: T: 2b N: 0 M: 0 Group: Stage II Right Lower Lobe Lung Cancer   National site-specific guidelines   were discussed with respect to the case.  Tumor board is a meeting of clinicians from various specialty areas who evaluate and discuss patients for whom a multidisciplinary approach is being considered. Final determinations in the plan of care are those of the provider(s). The responsibility for follow up of recommendations given during tumor board is that of the provider.   Today's extended care, comprehensive team conference, Christine Burnett was not present for the discussion and was not examined.   Multidisciplinary Tumor Board is a multidisciplinary case peer review process.  Decisions discussed in the Multidisciplinary Tumor Board reflect the opinions of the specialists present at the conference without having examined the patient.  Ultimately, treatment and diagnostic  decisions rest with the primary provider(s) and the patient.

## 2022-01-26 NOTE — Telephone Encounter (Signed)
PET scheduled 01/31/2022.  Dr. Patsey Berthold, please advise if 02/08/2022 at 4:00 would be okay to schedule?

## 2022-01-27 NOTE — Telephone Encounter (Signed)
Per epic, appt has been scheduled.  Will close encounter, as nothing further is needed.

## 2022-01-27 NOTE — Telephone Encounter (Signed)
Spoke w/ pt.  Advised her of Ryan's recommendation.   She verbalizes understanding and is appreciative of the call.

## 2022-01-31 ENCOUNTER — Encounter
Admission: RE | Admit: 2022-01-31 | Discharge: 2022-01-31 | Disposition: A | Discharge status: Home / Self Care | Payer: Medicare Other | Source: Ambulatory Visit | Attending: Oncology | Admitting: Oncology

## 2022-01-31 DIAGNOSIS — Z85118 Personal history of other malignant neoplasm of bronchus and lung: Secondary | ICD-10-CM | POA: Diagnosis not present

## 2022-01-31 DIAGNOSIS — J439 Emphysema, unspecified: Secondary | ICD-10-CM | POA: Insufficient documentation

## 2022-01-31 DIAGNOSIS — C3490 Malignant neoplasm of unspecified part of unspecified bronchus or lung: Secondary | ICD-10-CM | POA: Diagnosis not present

## 2022-01-31 DIAGNOSIS — I999 Unspecified disorder of circulatory system: Secondary | ICD-10-CM | POA: Diagnosis not present

## 2022-01-31 DIAGNOSIS — I714 Abdominal aortic aneurysm, without rupture, unspecified: Secondary | ICD-10-CM | POA: Insufficient documentation

## 2022-01-31 DIAGNOSIS — S2231XA Fracture of one rib, right side, initial encounter for closed fracture: Secondary | ICD-10-CM | POA: Diagnosis not present

## 2022-01-31 DIAGNOSIS — Z08 Encounter for follow-up examination after completed treatment for malignant neoplasm: Secondary | ICD-10-CM | POA: Insufficient documentation

## 2022-01-31 LAB — GLUCOSE, CAPILLARY: Glucose-Capillary: 98 mg/dL (ref 70–99)

## 2022-01-31 MED ORDER — FLUDEOXYGLUCOSE F - 18 (FDG) INJECTION
7.2000 | Freq: Once | INTRAVENOUS | Status: AC | PRN
  Administered 2022-01-31: 7.81 via INTRAVENOUS

## 2022-02-08 ENCOUNTER — Ambulatory Visit (INDEPENDENT_AMBULATORY_CARE_PROVIDER_SITE_OTHER): Payer: Medicare Other | Admitting: Pulmonary Disease

## 2022-02-08 ENCOUNTER — Encounter: Payer: Self-pay | Admitting: Pulmonary Disease

## 2022-02-08 VITALS — BP 130/80 | HR 103 | Temp 98.0°F | Ht 67.0 in | Wt 138.6 lb

## 2022-02-08 DIAGNOSIS — J449 Chronic obstructive pulmonary disease, unspecified: Secondary | ICD-10-CM

## 2022-02-08 DIAGNOSIS — I25118 Atherosclerotic heart disease of native coronary artery with other forms of angina pectoris: Secondary | ICD-10-CM

## 2022-02-08 DIAGNOSIS — J9611 Chronic respiratory failure with hypoxia: Secondary | ICD-10-CM | POA: Diagnosis not present

## 2022-02-08 DIAGNOSIS — C3491 Malignant neoplasm of unspecified part of right bronchus or lung: Secondary | ICD-10-CM | POA: Diagnosis not present

## 2022-02-08 NOTE — Patient Instructions (Signed)
The PET/CT did not show that you need any further procedures with regards to your lung cancer.  There is no evidence of recurrence.  You did qualify for oxygen when you walk today.  You will need to wear 2 L/min if the source is continuous or 3 L/min if it is on the mend meaning that every time you take a breath the oxygen machine will give you a breath.  Continue using your albuterol as needed.  I do think that he likely will need a stent in your heart.  We will see him in follow-up and 6 to 8 weeks time call sooner should any new problems arise.

## 2022-02-08 NOTE — Progress Notes (Signed)
Subjective:    Patient ID: Christine Burnett, female    DOB: 05-22-1941, 81 y.o.   MRN: [id number] Patient Care Team: Delsa Grana, PA-C as PCP - General (Family Medicine) Kate Sable, MD as PCP - Cardiology (Cardiology) Anthonette Legato, MD as Consulting Physician (Nephrology) Telford Nab, RN as Oncology Nurse Navigator Tyler Pita, MD as Consulting Physician (Pulmonary Disease) Sindy Guadeloupe, MD as Consulting Physician (Oncology) Noreene Filbert, MD as Referring Physician (Radiation Oncology)  Chief Complaint  Patient presents with   Follow-up   HPI Patient is an 81 year old former smoker (70 PY) who presents for evaluation of abnormal CT scanning post SBRT therapy for non-small cell carcinoma of the lung.  I last saw the patient on 23 December 2020 last seen by Derl Barrow, NP here on 28 January 2021.  Patient is currently not taking any respiratory medications as she feels that these "do not help".  She will occasionally use albuterol but for the most part does not feel that this is too helpful.  She has issues with dyspnea on exertion.  There is a question of whether she may need a cardiac stent.  She has significant coronary artery disease.  She appears frail.  She presents today in a transport chair due to shortness of breath on exertion.  She does not endorse any chest pain, no orthopnea or paroxysmal nocturnal dyspnea.  No lower extremity edema or calf tenderness.  She is compliant with oxygen at 2 L/min via nasal cannula.  She has previously failed to qualify for oxygen with ambulation.  PFTs: 12/29/2020: FEV1 1.59 L or 71% predicted, FVC 2.36 L or 79% predicted, FEV1/FVC 68%.  No bronchodilator response lung volumes at low end of normal diffusion capacity moderately reduced.  Consistent with mild to moderate, stage I- II COPD  Cardiac testing: 01/25/21 Echocardiogram - EF 81-19%, Grade 1 diastolic dysfunction, elevated right atrial pressure, normal PA systolic pressure   14/78 2022 left heart cath:Moderately to severely calcified coronary arteries.  Overall mild to moderate nonobstructive disease affecting the main vessels.  There is a 70% stenosis in the superior branch of a large first diagonal. Left ventricular angiography was not performed due to chronic kidney disease.  EF was normal by echo. Mildly elevated left ventricular end-diastolic pressure at 20 mmHg. 01/26/2022 echocardiogram: LVEF 60 to 65%, grade 1 DD, normal right-sided pressures, mild to moderate MR.  Aortic sclerosis without stenosis   Imaging: 01/17/21 CT chest- Interval decreased in size of right lower lobe lung lesion with surrounding post treatment changes. Small nonspecific pulmonary nodules are unchanged from previous exam, emphysema and aortic atherosclerosis, coronary artery calcifications 01/19/2022 CT chest: Stable spiculated nodule the right lower lobe with adjacent posttreatment changes.  Right hilar soft tissue causing persistent occlusion of proximal basal right lower lobe bronchi and complete occlusion of the proximal right middle branch bronchi concerning for progressive disease PET/CT recommended.  No pathologic lymph node enlargement. 01/31/2022 PET/CT: Stable posttreatment changes involving the right lung no findings suspicious for residual recurrent tumor, no mediastinal adenopathy.  Right seventh rib fracture posttraumatic, stable vascular disease, abdominal aortic aneurysm and emphysema  Review of Systems A 10 point review of systems was performed and it is as noted above otherwise negative.  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   Stage 3b chronic kidney disease ([place]) 08/31/2017   Hyperparathyroidism, 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/31/2017   Osteoporosis without current pathological fracture 03/30/2017   Cardiac murmur 04/07/2015   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 stage 4 chronic kidney disease, or unspecified chronic kidney disease 02/10/2015   Anxiety 01/05/2015   Hyperlipidemia 01/05/2015   HTN (hypertension) 09/24/2014   Social History   Tobacco Use   Smoking status: Former    Packs/day: 1.00    Years: 61.00    Total pack years: 61.00    Types: Cigarettes    Quit date: 03/25/2017    Years since quitting: 4.8   Smokeless tobacco: Never  Substance Use Topics   Alcohol use: No   Allergies  Allergen Reactions   Zithromax [Azithromycin]    Cefuroxime Axetil Rash   Ciprofloxacin Nausea Only    Unknown   Codeine Other (See Comments)    Patient does not like how the medication makes you feel    Current Meds  Medication Sig   acetaminophen (TYLENOL) 650 MG CR tablet Take 1 tablet (650 mg total) by mouth 4 (four) times daily as needed for pain.   aspirin 81 MG EC tablet Take 81 mg by mouth at bedtime. Swallow whole.   atorvastatin (LIPITOR) 40 MG tablet Take 1 tablet (40 mg total) by mouth at bedtime.   Calcium 200 MG TABS Take 300 mg by mouth every Monday, Tuesday, Wednesday, Thursday, and Friday.   Cholecalciferol (VITAMIN D3) 50 MCG (2000 UT) TABS Take 2,000 Units by mouth at bedtime.   isosorbide mononitrate (IMDUR) 30 MG 24 hr tablet Take 0.5 tablets (15 mg total) by mouth daily.   lidocaine (LIDODERM) 5 % Place 1 patch onto the skin daily. Remove & Discard patch within 12 hours or as directed by MD   melatonin 3 MG TABS tablet Take 3 mg by mouth at bedtime.   Multiple Vitamin (MULTIVITAMIN)  capsule Take 1 capsule by mouth daily.   PARoxetine (PAXIL) 40 MG tablet Take 1 tablet (40 mg total) by mouth every morning.   Probiotic Product (PROBIOTIC PO) Take 1 capsule by mouth at bedtime.   Immunization History  Administered Date(s) Administered   Fluad Quad(high Dose 65+) 06/27/2019, 02/08/2021   Influenza Split 03/15/2010, 03/21/2011   Influenza, High Dose Seasonal PF 04/07/2015, 03/22/2016, 03/13/2017, 02/25/2018   Influenza,inj,Quad PF,6+ Mos 03/19/2013, 03/10/2014   Pneumococcal Conjugate-13 11/08/2017   Pneumococcal Polysaccharide-23 01/21/2019   Tdap 05/29/2017       Objective:   Physical Exam BP 130/80 (BP Location: Left Arm, Patient Position: Sitting, Cuff Size: Large)   Pulse (!) 103   Temp 98 F (36.7 C) (Oral)   Ht 5\' 7"  (1.702 m)   Wt 138 lb 9.6 oz (62.9 kg)   SpO2 94%   BMI 21.71 kg/m  GENERAL: Well-developed, well-nourished elderly woman, in no acute distress. She presents in transport chair due to knee pain. No conversational dyspnea. HEAD: Normocephalic, atraumatic. EYES: Opacity over left eye, right eye with normal pupillary reflex.  No scleral icterus. MOUTH: Nose/mouth/throat not examined due to masking requirements for COVID 19. NECK: Supple. No thyromegaly. Trachea midline. No JVD.  No  adenopathy. PULMONARY: Good air entry bilaterally.  No adventitious sounds. CARDIOVASCULAR: S1 and S2. Regular rate and rhythm,HR 96. No rubs, murmurs or gallops heard. ABDOMEN: Benign. MUSCULOSKELETAL: No joint deformity, no clubbing, no edema. NEUROLOGIC: No overt focal deficit. Speech is fluent. Fully oriented.  SKIN: Intact,warm,dry. PSYCH: Mood and behavior normal.   Ambulatory oximetry: At rest the patient's oxygen saturation was 94% with ambulation the patient dropped saturations to 86%.  She required 2 L continuous O2 to maintain sats at 90% or better and 3 L in conserving mode to keep sats at 90% or better (what would be required for a POC).  Patient was  tachycardic throughout the study.  Representative images from CT chest performed 19 January 2022 concerning for recurrent disease, follow-up PET/CT 29 August showed only postradiation changes.  Findings are likely related to scarring post radiation:       Assessment & Plan:     ICD-10-CM   1. Chronic obstructive pulmonary disease, unspecified COPD type ([place])  J44.9 Ambulatory Referral for DME   Resume albuterol for now Reconsider maintenance medication    2. Chronic respiratory failure with hypoxia (HCC)  J96.11    Patient will need supplemental oxygen 2 L/min continuously With conserving device patient would need 3 L/min Orders placed    3. Coronary artery disease of native artery of native heart with stable angina pectoris [facility])  I25.118    Patient may a cardiac stent Being followed by cardiology This issue adds complexity to her management    4. Non-small cell cancer of right lung (HCC)  C34.91    Status post SBRT No evidence of recurrence by PET/CT Continue to monitor     Orders Placed This Encounter  Procedures   Ambulatory Referral for DME    Referral Priority:   Urgent    Referral Type:   Durable Medical Equipment Purchase    Number of Visits Requested:   1   We will see the patient in follow-up in 6 to 8 weeks time she is to call sooner should any new problems arise.  She is to continue follow-ups with oncology.  Definitely keep follow-ups with cardiology.  Renold Don, MD Advanced Bronchoscopy PCCM  [facility]    *This note was dictated using voice recognition software/Dragon.  Despite best efforts to proofread, errors can occur which can change the meaning. Any transcriptional errors that result from this process are unintentional and may not be fully corrected at the time of dictation.

## 2022-02-09 ENCOUNTER — Encounter: Payer: Self-pay | Admitting: Pulmonary Disease

## 2022-02-09 ENCOUNTER — Ambulatory Visit: Payer: Medicare Other

## 2022-02-14 DIAGNOSIS — N2581 Secondary hyperparathyroidism of renal origin: Secondary | ICD-10-CM | POA: Diagnosis not present

## 2022-02-14 DIAGNOSIS — N1832 Chronic kidney disease, stage 3b: Secondary | ICD-10-CM | POA: Diagnosis not present

## 2022-02-14 DIAGNOSIS — I959 Hypotension, unspecified: Secondary | ICD-10-CM | POA: Diagnosis not present

## 2022-03-03 ENCOUNTER — Ambulatory Visit: Payer: Medicare Other | Admitting: Radiation Oncology

## 2022-03-08 ENCOUNTER — Ambulatory Visit: Admission: RE | Admit: 2022-03-08 | Payer: Medicare Other | Source: Ambulatory Visit | Admitting: Radiation Oncology

## 2022-03-09 ENCOUNTER — Telehealth: Payer: Self-pay | Admitting: *Deleted

## 2022-03-09 NOTE — Telephone Encounter (Signed)
Left message regarding 04/05/2022 appointment.

## 2022-03-25 ENCOUNTER — Other Ambulatory Visit: Payer: Self-pay | Admitting: Nurse Practitioner

## 2022-03-25 DIAGNOSIS — F419 Anxiety disorder, unspecified: Secondary | ICD-10-CM

## 2022-03-25 DIAGNOSIS — F330 Major depressive disorder, recurrent, mild: Secondary | ICD-10-CM

## 2022-03-28 NOTE — Telephone Encounter (Signed)
Requested Prescriptions  Pending Prescriptions Disp Refills  . PARoxetine (PAXIL) 40 MG tablet [Pharmacy Med Name: PARoxetine HCl 40 MG Oral Tablet] 90 tablet 0    Sig: TAKE 1 TABLET BY MOUTH ONCE DAILY IN THE MORNING     Psychiatry:  Antidepressants - SSRI Passed - 03/25/2022  4:46 PM      Passed - Valid encounter within last 6 months    Recent Outpatient Visits          4 months ago Rash and nonspecific skin eruption   [facility] Delsa Grana, PA-C   4 months ago No-show for appointment   [facility] Delsa Grana, PA-C   8 months ago Back pain without sciatica   [place], FNP   1 year ago Oral thrush   [place], DO   1 year ago Mild episode of recurrent major depressive disorder [facility])   [facility] Bo Merino, FNP      Future Appointments            In 1 week Dunn, Areta Haber, PA-C [facility] [place]

## 2022-03-29 ENCOUNTER — Other Ambulatory Visit: Payer: Self-pay | Admitting: *Deleted

## 2022-03-29 DIAGNOSIS — E782 Mixed hyperlipidemia: Secondary | ICD-10-CM

## 2022-03-29 MED ORDER — ATORVASTATIN CALCIUM 40 MG PO TABS: 40.0000 mg | ORAL_TABLET | Freq: Every day | ORAL | 0 refills | Status: DC

## 2022-04-04 ENCOUNTER — Telehealth: Payer: Self-pay | Admitting: Cardiology

## 2022-04-04 ENCOUNTER — Ambulatory Visit (INDEPENDENT_AMBULATORY_CARE_PROVIDER_SITE_OTHER): Payer: Medicare Other | Admitting: Pulmonary Disease

## 2022-04-04 ENCOUNTER — Ambulatory Visit: Payer: Medicare Other | Admitting: Physician Assistant

## 2022-04-04 ENCOUNTER — Encounter: Payer: Self-pay | Admitting: Pulmonary Disease

## 2022-04-04 VITALS — BP 126/82 | HR 99 | Temp 97.5°F | Ht 67.0 in | Wt 138.0 lb

## 2022-04-04 DIAGNOSIS — C3491 Malignant neoplasm of unspecified part of right bronchus or lung: Secondary | ICD-10-CM | POA: Diagnosis not present

## 2022-04-04 DIAGNOSIS — J9611 Chronic respiratory failure with hypoxia: Secondary | ICD-10-CM | POA: Diagnosis not present

## 2022-04-04 DIAGNOSIS — Z23 Encounter for immunization: Secondary | ICD-10-CM | POA: Diagnosis not present

## 2022-04-04 DIAGNOSIS — J449 Chronic obstructive pulmonary disease, unspecified: Secondary | ICD-10-CM | POA: Diagnosis not present

## 2022-04-04 NOTE — Patient Instructions (Signed)
You are doing well.  Continue using your albuterol as needed.  Continue using oxygen.  We will see you in follow-up in 6 months time.  Please call sooner should any new problems arise.

## 2022-04-04 NOTE — Telephone Encounter (Signed)
*  STAT* If patient is at the pharmacy, call can be transferred to refill team.   1. Which medications need to be refilled? (please list name of each medication and dose if known) Atorvastatin 40mg  1 tabket at bedtime Isosorbide mononitrate 30mg  1 daily  2. Which pharmacy/location (including street and city if local pharmacy) is medication to be sent to? [place]  3. Do they need a 30 day or 90 day supply? 90 day

## 2022-04-04 NOTE — Progress Notes (Deleted)
   Subjective:    Patient ID: Christine Burnett, female    DOB: Dec 30, 1940, 81 y.o.   MRN: [id number]  HPI    Review of Systems     Objective:   Physical Exam        Assessment & Plan:

## 2022-04-05 ENCOUNTER — Encounter: Payer: Self-pay | Admitting: Radiation Oncology

## 2022-04-05 ENCOUNTER — Ambulatory Visit
Admission: RE | Admit: 2022-04-05 | Discharge: 2022-04-05 | Disposition: A | Discharge status: Home / Self Care | Payer: Medicare Other | Source: Ambulatory Visit | Attending: Radiation Oncology | Admitting: Radiation Oncology

## 2022-04-05 VITALS — BP 130/83 | HR 112 | Temp 97.7°F | Resp 16 | Ht 67.0 in | Wt 138.6 lb

## 2022-04-05 DIAGNOSIS — C3431 Malignant neoplasm of lower lobe, right bronchus or lung: Secondary | ICD-10-CM | POA: Diagnosis not present

## 2022-04-05 DIAGNOSIS — Z87891 Personal history of nicotine dependence: Secondary | ICD-10-CM | POA: Diagnosis not present

## 2022-04-05 DIAGNOSIS — C3432 Malignant neoplasm of lower lobe, left bronchus or lung: Secondary | ICD-10-CM

## 2022-04-05 MED ORDER — ISOSORBIDE MONONITRATE ER 30 MG PO TB24: 15.0000 mg | ORAL_TABLET | Freq: Every day | ORAL | 0 refills | Status: DC

## 2022-04-05 NOTE — Telephone Encounter (Signed)
Requested Prescriptions   Signed Prescriptions Disp Refills   isosorbide mononitrate (IMDUR) 30 MG 24 hr tablet 45 tablet 0    Sig: Take 0.5 tablets (15 mg total) by mouth daily.    Authorizing Provider: Rise Mu    Ordering User: Raelene Bott, Rieley Khalsa L   90day refill of atorvastatin sent to pharmacy on 03/29/22-receipt confirmed

## 2022-04-05 NOTE — Progress Notes (Signed)
Radiation Oncology Follow up Note  Name: Christine Burnett   Date:   04/05/2022 MRN:  [id number] DOB: 1940-12-16    This 81 y.o. female presents to the clinic today for 23-month follow-up status post SBRT to a right lower lobe for non-small cell lung cancer.  REFERRING PROVIDER: Delsa Grana, PA-C  HPI: Patient is a an 81 year old female now out 18 months having completed SBRT to right lower lobe for non-small cell lung cancer.  Seen today in routine follow-up she is doing well specifically [place] cough any change in her pulmonary status.  She had a recent PET scan as well as CT scan showing complete resolution of.  Mass with no findings suspicious for residual recurrent tumor or local regional adenopathy.  COMPLICATIONS OF TREATMENT: none  FOLLOW UP COMPLIANCE: keeps appointments   PHYSICAL EXAM:  BP 130/83   Pulse (!) 112   Temp 97.7 F (36.5 C)   Resp 16   Ht 5\' 7"  (1.702 m)   Wt 138 lb 9.6 oz (62.9 kg)   BMI 21.71 kg/m  Elderly female somewhat frail in NAD.  Well-developed well-nourished patient in NAD. HEENT reveals PERLA, EOMI, discs not visualized.  Oral cavity is clear. No oral mucosal lesions are identified. Neck is clear without evidence of cervical or supraclavicular adenopathy. Lungs are clear to A&P. Cardiac examination is essentially unremarkable with regular rate and rhythm without murmur rub or thrill. Abdomen is benign with no organomegaly or masses noted. Motor sensory and DTR levels are equal and symmetric in the upper and lower extremities. Cranial nerves II through XII are grossly intact. Proprioception is intact. No peripheral adenopathy or edema is identified. No motor or sensory levels are noted. Crude visual fields are within normal range.  RADIOLOGY RESULTS: CT scan and PET CT scan reviewed compatible with above-stated findings  PLAN: Present time she is now 18 months from SBRT with no evidence of disease.  And pleased with her overall progress.  Patient would  like to continue follow-up care with Dr. Patsey Berthold.  I think that is fine.  I be happy to reevaluate her anytime should that be indicated.  I would like to take this opportunity to thank you for allowing me to participate in the care of your patient.Noreene Filbert, MD

## 2022-04-14 NOTE — Progress Notes (Unsigned)
Cardiology Office Note    Date:  04/18/2022   ID:  Ayza, Ripoll 29-Mar-1941, MRN [id number]  PCP:  Delsa Grana, PA-C  Cardiologist:  Kate Sable, MD  Electrophysiologist:  None   Chief Complaint: Follow-up  History of Present Illness:   Christine Burnett is a 81 y.o. female with history of nonobstructive CAD by [place] on 04/2021, PSVT, mild aortic valve stenosis, TIA, HLD, non-small cell lung cancer s/p SBRT in 08/2020, CKD stage IIIb, COPD, compression fracture, and prior tobacco use who presents for follow-up of nonobstructive CAD and aortic stenosis   Nuclear stress test in 03/2019 showed no evidence of ischemia and was low risk with an LVEF greater than 65%.   Outpatient cardiac monitoring in 04/2019 demonstrated a predominant rhythm of sinus with 1 run of NSVT lasting 11 beats, 23 episodes of SVT with the longest interval lasting 42.7 seconds.   Echo in 01/2021 demonstrated an EF of 60 to 01%, grade 1 diastolic dysfunction, normal RV systolic function and ventricular cavity size, normal PASP, mild to moderate mitral valve regurgitation, mild aortic valve stenosis, and an estimated right atrial pressure of 3 mmHg.   Prior CT imaging of the chest demonstrated significant LAD and RCA calcifications.  In the context of chest pain, she underwent LHC in 04/2021, that demonstrated moderately to severely calcified coronary arteries with overall mild to moderate nonobstructive disease affecting the main vessels.  There was a 70% stenosis in the superior branch of a large first diagonal.  Mildly elevated LVEDP at 20 mmHg.  Medical therapy was recommended with PCI of the diagonal to be considered if she had refractory angina despite optimization of antianginal therapy.   She was seen in the office in 06/2021 and was without symptoms of angina or decompensation.  She did note dizziness with blood pressure soft at 87/64.  Imdur was reduced to 15 mg daily.  She was last seen in the  office on 01/02/2022 continuing to note chronic stable exertional dyspnea and chest heaviness that was unchanged dating back to late 2022, which led up to her cardiac cath which showed nonobstructive disease.  She did continue to note some positional dizziness without frank syncope.  Her weight was down 7 pounds when compared to her prior clinic visit.  Echo on 01/26/2022 demonstrated an EF of 60 to 65%, no regional wall motion abnormalities, mild LVH, grade 1 diastolic dysfunction, normal RV systolic function and ventricular cavity size, mild to moderate mitral regurgitation, aortic valve sclerosis without evidence of stenosis, and an estimated right atrial pressure of 3 mmHg.  She comes in today accompanied by her daughter.  Overall, she is doing well.  She does continue to note chronic stable dyspnea as well as sharp split-second lasting intermittent migratory left-sided chest discomfort that is more noticeable when sitting on her sofa or laying in bed.  She does wonder if she is more short of breath when laying down or if this is just in the setting of getting ready for bed.  No lower extremity swelling, abdominal distention, or progressive orthopnea.  No dizziness, presyncope, or syncope.  She does note dyspnea at times when ambulating to and from her mailbox.  She is without exertional chest pain.  No falls or symptoms concerning for bleeding.   Labs independently reviewed: 01/2022 - Hgb 11.9, PLT 188, potassium 4.4, BUN 24, serum creatinine 1.48 09/2021 - albumin 3.8, AST/ALT normal 03/2021 - TC 113, TG 93, HDL 45, LDL 50 08/2018 -  TSH normal, A1c 5.7  Past Medical History:  Diagnosis Date   Allergic rhinitis    Anxiety    Arthritis Me   Chronic bronchitis (HCC)    Chronic kidney disease, stage III (moderate) ([place]) 08/31/2017   Seeing nephrologist   CKD (chronic kidney disease), stage III ([place])    COPD (chronic obstructive pulmonary disease) ([place])    Cornea scar    Depression    unspecified    Diverticulosis    Emphysema of lung ([place]) Me   Hx of fracture of left hip 12/24/2017   Hyperlipidemia    Hyperparathyroidism, 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   Managed by nephrologist   Non-small cell lung cancer (NSCLC) ([place])    Osteoporosis    Oxygen deficiency Me   Risk for falls    Situational disturbance    Stroke ([place]) 2011   tia's x 2   TIA (transient ischemic attack)     Past Surgical History:  Procedure Laterality Date   COLONOSCOPY  2016   ESOPHAGUS SURGERY     INTRAMEDULLARY (IM) NAIL INTERTROCHANTERIC Left 03/26/2017   Procedure: INTRAMEDULLARY (IM) NAIL INTERTROCHANTRIC;  Surgeon: Hessie Knows, MD;  Location: [facility];  Service: Orthopedics;  Laterality: Left;   LEFT HEART CATH AND CORONARY ANGIOGRAPHY N/A 04/15/2021   Procedure: LEFT HEART CATH AND CORONARY ANGIOGRAPHY;  Surgeon: Wellington Hampshire, MD;  Location: [facility];  Service: Cardiovascular;  Laterality: N/A;   VIDEO BRONCHOSCOPY WITH ENDOBRONCHIAL NAVIGATION Right 06/28/2020   Procedure: VIDEO BRONCHOSCOPY WITH ENDOBRONCHIAL NAVIGATION CELLVIZIO;  Surgeon: Tyler Pita, MD;  Location: [facility];  Service: Pulmonary;  Laterality: Right;    Current Medications: Current Meds  Medication Sig   acetaminophen (TYLENOL) 650 MG CR tablet Take 1 tablet (650 mg total) by mouth 4 (four) times daily as needed for pain.   albuterol (VENTOLIN HFA) 108 (90 Base) MCG/ACT inhaler Inhale 2 puffs into the lungs every 6 (six) hours as needed for wheezing or shortness of breath.   amLODipine (NORVASC) 2.5 MG tablet Take 1 tablet (2.5 mg total) by mouth daily.   aspirin 81 MG EC tablet Take 81 mg by mouth at bedtime. Swallow whole.   atorvastatin (LIPITOR) 40 MG tablet Take 1 tablet (40 mg total) by mouth at bedtime.   Calcium 200 MG TABS Take 300 mg by mouth every Monday, Tuesday, Wednesday, Thursday, and Friday.   Cholecalciferol (VITAMIN D3) 50 MCG (2000 UT) TABS Take 2,000 Units by mouth at bedtime.    isosorbide mononitrate (IMDUR) 30 MG 24 hr tablet Take 0.5 tablets (15 mg total) by mouth daily.   lidocaine (LIDODERM) 5 % Place 1 patch onto the skin daily. Remove & Discard patch within 12 hours or as directed by MD   melatonin 3 MG TABS tablet Take 3 mg by mouth at bedtime.   Multiple Vitamin (MULTIVITAMIN) capsule Take 1 capsule by mouth daily.   PARoxetine (PAXIL) 40 MG tablet TAKE 1 TABLET BY MOUTH ONCE DAILY IN THE MORNING   Probiotic Product (PROBIOTIC PO) Take 1 capsule by mouth at bedtime.    Allergies:   Zithromax [azithromycin], Cefuroxime axetil, Ciprofloxacin, and Codeine   Social History   Socioeconomic History   Marital status: Divorced    Spouse name: Not on file   Number of children: 4   Years of education: Not on file   Highest education level: 12th grade  Occupational History   Occupation: Retired    Comment: payroll  Tobacco Use   Smoking  status: Former    Packs/day: 1.00    Years: 61.00    Total pack years: 61.00    Types: Cigarettes    Quit date: 03/25/2017    Years since quitting: 5.0   Smokeless tobacco: Never  Vaping Use   Vaping Use: Never used  Substance and Sexual Activity   Alcohol use: No   Drug use: No   Sexual activity: Not Currently  Other Topics Concern   Not on file  Social History Narrative   Full Code   4 children   Denies alcohol use   Patient has 1 daughters and 1 grandson living with her   Social Determinants of Health   Financial Resource Strain: Low Risk  (02/08/2021)   Overall Financial Resource Strain (CARDIA)    Difficulty of Paying Living Expenses: Not hard at all  Food Insecurity: No Food Insecurity (02/08/2021)   Hunger Vital Sign    Worried About Running Out of Food in the Last Year: Never true    [place] in the Last Year: Never true  Transportation Needs: No Transportation Needs (02/08/2021)   PRAPARE - Hydrologist (Medical): No    Lack of Transportation (Non-Medical): No   Physical Activity: Inactive (02/08/2021)   Exercise Vital Sign    Days of Exercise per Week: 0 days    Minutes of Exercise per Session: 0 min  Stress: Stress Concern Present (02/08/2021)   [place]    Feeling of Stress : Rather much  Social Connections: Moderately Isolated (02/08/2021)   Social Connection and Isolation Panel [NHANES]    Frequency of Communication with Friends and Family: More than three times a week    Frequency of Social Gatherings with Friends and Family: More than three times a week    Attends Religious Services: More than 4 times per year    Active Member of [employer] or Organizations: No    Attends Music therapist: Never    Marital Status: Divorced     Family History:  The patient's family history includes Cancer in her father; Diabetes in her brother; Heart attack in her brother and father; Heart disease in her father; Hypertension in her daughter; Kidney disease in her mother; Other in her mother; Prostate cancer in her father. There is no history of Kidney cancer, Bladder Cancer, Colon cancer, Colon polyps, or Rectal cancer.  ROS:   12-point review of systems is negative unless otherwise noted in the HPI   EKGs/Labs/Other Studies Reviewed:    Studies reviewed were summarized above. The additional studies were reviewed today:  2D echo 01/26/2022: 1. Left ventricular ejection fraction, by estimation, is 60 to 65%. The  left ventricle has normal function. The left ventricle has no regional  wall motion abnormalities. There is mild left ventricular hypertrophy.  Left ventricular diastolic parameters  are consistent with Grade I diastolic dysfunction (impaired relaxation).   2. Right ventricular systolic function is normal. The right ventricular  size is normal.   3. The mitral valve is normal in structure. Mild to moderate mitral valve  regurgitation. No evidence of mitral stenosis.    4. The aortic valve was not well visualized. Aortic valve regurgitation  is not visualized. Aortic valve sclerosis/calcification is present,  without any evidence of aortic stenosis. Aortic valve area, by VTI  measures 2.03 cm. Aortic valve mean gradient  measures 4.5 mmHg.   5. The inferior vena cava is  normal in size with greater than 50%  respiratory variability, suggesting right atrial pressure of 3 mmHg.  __________  LHC 04/15/2021:   Ost RCA to Prox RCA lesion is 30% stenosed.   Dist RCA lesion is 40% stenosed.   1st Mrg lesion is 50% stenosed.   Mid LAD lesion is 40% stenosed.   1st Diag lesion is 70% stenosed.   1.  Moderately to severely calcified coronary arteries.  Overall mild to moderate nonobstructive disease affecting the main vessels.  There is a 70% stenosis in the superior branch of a large first diagonal. 2.  Left ventricular angiography was not performed due to chronic kidney disease.  EF was normal by echo. 3.  Mildly elevated left ventricular end-diastolic pressure at 20 mmHg.   Recommendations: Recommend attempted medical therapy and optimizing blood pressure.  I added Imdur 30 mg daily.  Continue treatment of risk factors.  PCI of the diagonal can be considered but only if she has refractory angina in spite of medical therapy. __________   2D echo 01/25/2021: 1. Left ventricular ejection fraction, by estimation, is 60 to 65%. The  left ventricle has normal function. Left ventricular endocardial border  not optimally defined to evaluate regional wall motion. Left ventricular  diastolic parameters are consistent  with Grade I diastolic dysfunction (impaired relaxation). Elevated left  atrial pressure.   2. Right ventricular systolic function is normal. The right ventricular  size is normal. There is normal pulmonary artery systolic pressure.   3. The mitral valve is grossly normal. Mild to moderate mitral valve  regurgitation. No evidence of mitral stenosis.    4. The aortic valve was not well visualized. Aortic valve regurgitation  is not visualized. Mild aortic valve stenosis.   5. The inferior vena cava is normal in size with greater than 50%  respiratory variability, suggesting right atrial pressure of 3 mmHg. __________   Elwyn Reach patch 04/2019: Patient had a min HR of 53 bpm, max HR of 187 bpm, and avg HR of 88 bpm. Predominant underlying rhythm was Sinus Rhythm. 1 run of Ventricular Tachycardia occurred lasting 11 beats with a max rate of 148 bpm (avg 130 bpm). 23 Supraventricular Tachycardia runs occurred, the run with the fastest interval lasting 7 beats with a max rate of 187 bpm, the longest lasting 42.7 secs with an avg rate of 124 bpm. Isolated SVEs were rare (<1.0%), SVE Couplets were rare (<1.0%), and SVE Triplets were rare (<1.0%). Isolated VEs were rare (<1.0%), 1 patient triggered event was noted associated with sinus rhythm. ___________   Carlton Adam MPI 03/31/2019: The study is normal. This is a low risk study. The left ventricular ejection fraction is hyperdynamic (>65%). ____________   2D echo 03/27/2017: - Left ventricle: The cavity size was normal. Wall thickness was    increased in a pattern of mild LVH. Systolic function was    vigorous. The estimated ejection fraction was in the range of 65%    to 70%. Wall motion was normal; there were no regional wall    motion abnormalities. Doppler parameters are consistent with    abnormal left ventricular relaxation (grade 1 diastolic    dysfunction).  - Right ventricle: The cavity size was normal. Systolic function    was normal.  - Pulmonary arteries: Systolic pressure was mildly increased, in    the range of 35 mm Hg to 40 mm Hg.   EKG:  EKG is ordered today.  The EKG ordered today demonstrates sinus tachycardia, 102 bpm,,  nonspecific ST-T changes, consistent with prior tracing  Recent Labs: 01/25/2022: ALT 14; BUN 24; Creatinine, Ser 1.48; Hemoglobin 11.9; Platelets 188;  Potassium 4.4; Sodium 138  Recent Lipid Panel    Component Value Date/Time   CHOL 113 03/23/2021 1012   CHOL 144 07/03/2014 0526   TRIG 93 03/23/2021 1012   TRIG 320 (H) 07/03/2014 0526   HDL 45 03/23/2021 1012   HDL 33 (L) 07/03/2014 0526   CHOLHDL 2.5 03/23/2021 1012   CHOLHDL 3.0 01/29/2020 1515   VLDL 37 (H) 11/10/2016 1531   VLDL 64 (H) 07/03/2014 0526   LDLCALC 50 03/23/2021 1012   LDLCALC 73 01/29/2020 1515   LDLCALC 47 07/03/2014 0526    PHYSICAL EXAM:    VS:  BP 110/84 (BP Location: Left Arm, Patient Position: Sitting, Cuff Size: Normal)   Pulse (!) 102   Ht 5\' 7"  (1.702 m)   Wt 138 lb (62.6 kg)   SpO2 97%   BMI 21.61 kg/m   BMI: Body mass index is 21.61 kg/m.  Physical Exam Vitals reviewed.  Constitutional:      Appearance: She is well-developed.  HENT:     Head: Normocephalic and atraumatic.  Eyes:     General:        Right eye: No discharge.        Left eye: No discharge.  Neck:     Vascular: No JVD.  Cardiovascular:     Rate and Rhythm: Normal rate and regular rhythm.     Heart sounds: S1 normal and S2 normal. Heart sounds not distant. No midsystolic click and no opening snap. Murmur heard.     Systolic murmur is present with a grade of 1/6 at the upper right sternal border.     No friction rub.  Pulmonary:     Effort: Pulmonary effort is normal. No respiratory distress.     Breath sounds: Normal breath sounds. No decreased breath sounds, wheezing or rales.  Chest:     Chest wall: No tenderness.  Abdominal:     General: There is no distension.  Musculoskeletal:     Cervical back: Normal range of motion.  Skin:    General: Skin is warm and dry.     Nails: There is no clubbing.  Neurological:     Mental Status: She is alert and oriented to person, place, and time.  Psychiatric:        Speech: Speech normal.        Behavior: Behavior normal.        Thought Content: Thought content normal.        Judgment: Judgment normal.     Wt Readings  from Last 3 Encounters:  04/18/22 138 lb (62.6 kg)  04/05/22 138 lb 9.6 oz (62.9 kg)  04/04/22 138 lb (62.6 kg)     ASSESSMENT & PLAN:   Nonobstructive CAD with chronic exertional dyspnea: She continues to note chronic dyspnea along with brief migratory sharp episodes of left-sided chest discomfort that are nonexertional.  Her dyspnea is likely multifactorial including underlying CAD, aortic stenosis, COPD, history of non-small cell lung cancer status post SBRT and radiation therapy with PET/CT in 01/2022 showing no suspicious findings for residual or recurrent tumor.  We did discuss escalation of antianginal therapy versus proceeding with diagnostic R/LHC.  Given her left-sided chest discomfort appears to be more atypical in etiology, and overall stable symptoms, we have elected to add amlodipine 2.5 mg daily for added antianginal effect.  Addition of Ranexa  is not a great option given a creatinine clearance of 28.  Should she note no change in symptoms or noted intolerance to amlodipine, we will likely need to consider R/LHC.  Risks and benefits of cardiac cath were discussed in detail today.  Continue aggressive risk factor modification including aspirin, atorvastatin, and Imdur with the addition of amlodipine as outlined above.  Paroxysmal SVT: Noted on prior outpatient cardiac monitoring.  Quiescent.  No longer on beta-blocker secondary to orthostasis.  Aortic valve stenosis: Echo in 01/2022 showed aortic valve sclerosis without evidence of stenosis.  HLD: LDL 58 in 03/2021.  She remains on atorvastatin 40 mg.  CKD stage IIIb: Followed by nephrology.  Stable on most recent check.  History of TIA: Prior documentation indicates possibility of patient previously being on warfarin.  No objective evidence of A-fib available for review with chart biopsy.  She remains on aspirin and atorvastatin.   Shared Decision Making/Informed Consent{  The risks [stroke (1 in 1000), death (1 in 1000), kidney  failure [usually temporary] (1 in 500), bleeding (1 in 200), allergic reaction [possibly serious] (1 in 200)], benefits (diagnostic support and management of coronary artery disease) and alternatives of a cardiac catheterization were discussed in detail with Christine Burnett and she is willing to proceed.     Disposition: F/u with Dr. Garen Lah or an APP in 2 weeks.   Medication Adjustments/Labs and Tests Ordered: Current medicines are reviewed at length with the patient today.  Concerns regarding medicines are outlined above. Medication changes, Labs and Tests ordered today are summarized above and listed in the Patient Instructions accessible in Encounters.   Signed, Christell Faith, PA-C 04/18/2022 11:27 AM     [place] [address] [place], [place] [address] [phone]

## 2022-04-18 ENCOUNTER — Ambulatory Visit: Payer: Medicare Other | Attending: Physician Assistant | Admitting: Physician Assistant

## 2022-04-18 ENCOUNTER — Encounter: Payer: Self-pay | Admitting: Physician Assistant

## 2022-04-18 VITALS — BP 110/84 | HR 102 | Ht 67.0 in | Wt 138.0 lb

## 2022-04-18 DIAGNOSIS — I2510 Atherosclerotic heart disease of native coronary artery without angina pectoris: Secondary | ICD-10-CM | POA: Insufficient documentation

## 2022-04-18 DIAGNOSIS — N184 Chronic kidney disease, stage 4 (severe): Secondary | ICD-10-CM | POA: Diagnosis not present

## 2022-04-18 DIAGNOSIS — E785 Hyperlipidemia, unspecified: Secondary | ICD-10-CM | POA: Diagnosis not present

## 2022-04-18 DIAGNOSIS — I471 Supraventricular tachycardia, unspecified: Secondary | ICD-10-CM | POA: Diagnosis not present

## 2022-04-18 DIAGNOSIS — I350 Nonrheumatic aortic (valve) stenosis: Secondary | ICD-10-CM | POA: Insufficient documentation

## 2022-04-18 DIAGNOSIS — Z8673 Personal history of transient ischemic attack (TIA), and cerebral infarction without residual deficits: Secondary | ICD-10-CM | POA: Insufficient documentation

## 2022-04-18 MED ORDER — AMLODIPINE BESYLATE 2.5 MG PO TABS
2.5000 mg | ORAL_TABLET | Freq: Every day | ORAL | 3 refills | Status: DC
Start: 2022-04-18 — End: 2022-05-26

## 2022-04-18 NOTE — Patient Instructions (Signed)
Medication Instructions:  Your physician has recommended you make the following change in your medication:   START Amlodipine 2.5 mg once daily  *If you need a refill on your cardiac medications before your next appointment, please call your pharmacy*   Lab Work: None  If you have labs (blood work) drawn today and your tests are completely normal, you will receive your results only by: [place] (if you have MyChart) OR A paper copy in the mail If you have any lab test that is abnormal or we need to change your treatment, we will call you to review the results.   Testing/Procedures: None   Follow-Up: At [facility], you and your health needs are our priority.  As part of our continuing mission to provide you with exceptional heart care, we have created designated Provider Care Teams.  These Care Teams include your primary Cardiologist (physician) and Advanced Practice Providers (APPs -  Physician Assistants and Nurse Practitioners) who all work together to provide you with the care you need, when you need it.   Your next appointment:   2 week(s)  The format for your next appointment:   In Person  Provider:   Kate Sable, MD or Christell Faith, PA-C         Important Information About Sugar

## 2022-04-29 NOTE — H&P (View-Only) (Signed)
Cardiology Office Note    Date:  05/02/2022   ID:  Christine Burnett, Christine Burnett 06-27-1940, MRN [id number]  PCP:  Delsa Grana, PA-C  Cardiologist:  Kate Sable, MD  Electrophysiologist:  None   Chief Complaint: Follow up  History of Present Illness:   RENNAE FERRAIOLO is a 81 y.o. female with history of nonobstructive CAD by [place] on 04/2021, PSVT, mild aortic valve stenosis, TIA, HLD, non-small cell lung cancer s/p SBRT in 08/2020, CKD stage IIIb, COPD, compression fracture, and prior tobacco use who presents for follow-up of nonobstructive CAD and aortic stenosis.   Nuclear stress test in 03/2019 showed no evidence of ischemia and was low risk with an LVEF greater than 65%.   Outpatient cardiac monitoring in 04/2019 demonstrated a predominant rhythm of sinus with 1 run of NSVT lasting 11 beats, 23 episodes of SVT with the longest interval lasting 42.7 seconds.   Echo in 01/2021 demonstrated an EF of 60 to 23%, grade 1 diastolic dysfunction, normal RV systolic function and ventricular cavity size, normal PASP, mild to moderate mitral valve regurgitation, mild aortic valve stenosis, and an estimated right atrial pressure of 3 mmHg.   Prior CT imaging of the chest demonstrated significant LAD and RCA calcifications.  In the context of chest pain, she underwent LHC in 04/2021, that demonstrated moderately to severely calcified coronary arteries with overall mild to moderate nonobstructive disease affecting the main vessels.  There was a 70% stenosis in the superior branch of a large first diagonal.  Mildly elevated LVEDP at 20 mmHg.  Medical therapy was recommended with PCI of the diagonal to be considered if she had refractory angina despite optimization of antianginal therapy.   She was seen in the office in 06/2021 and was without symptoms of angina or decompensation.  She did note dizziness with blood pressure soft at 87/64.  Imdur was reduced to 15 mg daily.   She was seen in the office  on 01/02/2022 continuing to note chronic stable exertional dyspnea and chest heaviness that was unchanged dating back to late 2022, which led up to her cardiac cath which showed nonobstructive disease.  She did continue to note some positional dizziness without frank syncope.  Her weight was down 7 pounds when compared to her prior clinic visit.  Echo on 01/26/2022 demonstrated an EF of 60 to 65%, no regional wall motion abnormalities, mild LVH, grade 1 diastolic dysfunction, normal RV systolic function and ventricular cavity size, mild to moderate mitral regurgitation, aortic valve sclerosis without evidence of stenosis, and an estimated right atrial pressure of 3 mmHg.  She was last seen in the office on 04/18/2022 and continued to note chronic dyspnea as well as sharp split-second lasting intermittent migratory left-sided chest discomfort that was more noticeable when sitting on her sofa or laying in bed.  She did wonder if she was more short of breath when laying down or if this is just in the setting of getting ready for bed, as well as dyspnea at times when ambulating to and from her mailbox.  She was without exertional chest pain.  She was started on amlodipine 2.5 mg in an effort to further optimize her antianginal therapy.    She comes in accompanied by her daughter today.  Since initiating amlodipine, she has noted an improvement, though not resolution of her chest discomfort.  She continues to note progressive exertional dyspnea and fatigue, as well as wheezing.  She is having to use her supplemental oxygen during  daylight hours now whereas previously she was using this just nocturnally.  She feels like she does not have enough energy to perform prior tasks like she used to.  No significant lower extremity swelling.  She does continue to note some mild positional dizziness, though was without symptoms of presyncope or syncope.   Labs independently reviewed: 01/2022 - Hgb 11.9, PLT 188, potassium 4.4,  BUN 24, serum creatinine 1.48 09/2021 - albumin 3.8, AST/ALT normal 03/2021 - TC 113, TG 93, HDL 45, LDL 50 08/2018 - TSH normal, A1c 5.7  Past Medical History:  Diagnosis Date   Allergic rhinitis    Anxiety    Arthritis Me   Chronic bronchitis (HCC)    Chronic kidney disease, stage III (moderate) ([place]) 08/31/2017   Seeing nephrologist   CKD (chronic kidney disease), stage III (HCC)    COPD (chronic obstructive pulmonary disease) ([place])    Cornea scar    Depression    unspecified   Diverticulosis    Emphysema of lung ([place]) Me   Hx of fracture of left hip 12/24/2017   Hyperlipidemia    Hyperparathyroidism, 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   Managed by nephrologist   Non-small cell lung cancer (NSCLC) ([place])    Osteoporosis    Oxygen deficiency Me   Risk for falls    Situational disturbance    Stroke ([place]) 2011   tia's x 2   TIA (transient ischemic attack)     Past Surgical History:  Procedure Laterality Date   COLONOSCOPY  2016   ESOPHAGUS SURGERY     INTRAMEDULLARY (IM) NAIL INTERTROCHANTERIC Left 03/26/2017   Procedure: INTRAMEDULLARY (IM) NAIL INTERTROCHANTRIC;  Surgeon: Hessie Knows, MD;  Location: [facility];  Service: Orthopedics;  Laterality: Left;   LEFT HEART CATH AND CORONARY ANGIOGRAPHY N/A 04/15/2021   Procedure: LEFT HEART CATH AND CORONARY ANGIOGRAPHY;  Surgeon: Wellington Hampshire, MD;  Location: [facility];  Service: Cardiovascular;  Laterality: N/A;   VIDEO BRONCHOSCOPY WITH ENDOBRONCHIAL NAVIGATION Right 06/28/2020   Procedure: VIDEO BRONCHOSCOPY WITH ENDOBRONCHIAL NAVIGATION CELLVIZIO;  Surgeon: Tyler Pita, MD;  Location: [facility];  Service: Pulmonary;  Laterality: Right;    Current Medications: Current Meds  Medication Sig   acetaminophen (TYLENOL) 650 MG CR tablet Take 1 tablet (650 mg total) by mouth 4 (four) times daily as needed for pain.   albuterol (VENTOLIN HFA) 108 (90 Base) MCG/ACT inhaler Inhale 2 puffs into the lungs every 6  (six) hours as needed for wheezing or shortness of breath.   amLODipine (NORVASC) 2.5 MG tablet Take 1 tablet (2.5 mg total) by mouth daily.   aspirin 81 MG EC tablet Take 81 mg by mouth at bedtime. Swallow whole.   atorvastatin (LIPITOR) 40 MG tablet Take 1 tablet (40 mg total) by mouth at bedtime.   Calcium 200 MG TABS Take 300 mg by mouth every Monday, Tuesday, Wednesday, Thursday, and Friday.   Cholecalciferol (VITAMIN D3) 50 MCG (2000 UT) TABS Take 2,000 Units by mouth at bedtime.   isosorbide mononitrate (IMDUR) 30 MG 24 hr tablet Take 0.5 tablets (15 mg total) by mouth daily.   lidocaine (LIDODERM) 5 % Place 1 patch onto the skin daily. Remove & Discard patch within 12 hours or as directed by MD   melatonin 3 MG TABS tablet Take 3 mg by mouth at bedtime.   Multiple Vitamin (MULTIVITAMIN) capsule Take 1 capsule by mouth daily.   PARoxetine (PAXIL) 40 MG tablet TAKE 1 TABLET BY MOUTH ONCE DAILY  IN THE MORNING   Probiotic Product (PROBIOTIC PO) Take 1 capsule by mouth at bedtime.    Allergies:   Zithromax [azithromycin], Cefuroxime axetil, Ciprofloxacin, and Codeine   Social History   Socioeconomic History   Marital status: Divorced    Spouse name: Not on file   Number of children: 4   Years of education: Not on file   Highest education level: 12th grade  Occupational History   Occupation: Retired    Comment: payroll  Tobacco Use   Smoking status: Former    Packs/day: 1.00    Years: 61.00    Total pack years: 61.00    Types: Cigarettes    Quit date: 03/25/2017    Years since quitting: 5.1   Smokeless tobacco: Never  Vaping Use   Vaping Use: Never used  Substance and Sexual Activity   Alcohol use: No   Drug use: No   Sexual activity: Not Currently  Other Topics Concern   Not on file  Social History Narrative   Full Code   4 children   Denies alcohol use   Patient has 1 daughters and 1 grandson living with her   Social Determinants of Health   Financial Resource  Strain: [place]  (02/08/2021)   Overall Financial Resource Strain (CARDIA)    Difficulty of Paying Living Expenses: Not hard at all  Food Insecurity: No Food Insecurity (02/08/2021)   Hunger Vital Sign    Worried About Running Out of Food in the Last Year: Never true    [employer] in the Last Year: Never true  Transportation Needs: No Transportation Needs (02/08/2021)   PRAPARE - Hydrologist (Medical): No    Lack of Transportation (Non-Medical): No  Physical Activity: Inactive (02/08/2021)   Exercise Vital Sign    Days of Exercise per Week: 0 days    Minutes of Exercise per Session: 0 min  Stress: Stress Concern Present (02/08/2021)   [place]    Feeling of Stress : Rather much  Social Connections: Moderately Isolated (02/08/2021)   Social Connection and Isolation Panel [NHANES]    Frequency of Communication with Friends and Family: More than three times a week    Frequency of Social Gatherings with Friends and Family: More than three times a week    Attends Religious Services: More than 4 times per year    Active Member of [employer] or Organizations: No    Attends Music therapist: Never    Marital Status: Divorced     Family History:  The patient's family history includes Cancer in her father; Diabetes in her brother; Heart attack in her brother and father; Heart disease in her father; Hypertension in her daughter; Kidney disease in her mother; Other in her mother; Prostate cancer in her father. There is no history of Kidney cancer, Bladder Cancer, Colon cancer, Colon polyps, or Rectal cancer.  ROS:   Review of Systems  Constitutional:  Positive for malaise/fatigue. Negative for chills, diaphoresis, fever and weight loss.  HENT:  Negative for congestion.   Eyes:  Negative for discharge and redness.  Respiratory:  Positive for shortness of breath and wheezing. Negative for cough  and sputum production.   Cardiovascular:  Positive for chest pain. Negative for palpitations, orthopnea, claudication, leg swelling and PND.  Gastrointestinal:  Negative for abdominal pain, heartburn, nausea and vomiting.  Musculoskeletal:  Negative for falls and myalgias.  Skin:  Negative for rash.  Neurological:  Negative for dizziness, tingling, tremors, sensory change, speech change, focal weakness and loss of consciousness.  Endo/Heme/Allergies:  Does not bruise/bleed easily.  Psychiatric/Behavioral:  Negative for substance abuse. The patient is not nervous/anxious.   All other systems reviewed and are negative.    EKGs/Labs/Other Studies Reviewed:    Studies reviewed were summarized above. The additional studies were reviewed today:  2D echo 01/26/2022: 1. Left ventricular ejection fraction, by estimation, is 60 to 65%. The  left ventricle has normal function. The left ventricle has no regional  wall motion abnormalities. There is mild left ventricular hypertrophy.  Left ventricular diastolic parameters  are consistent with Grade I diastolic dysfunction (impaired relaxation).   2. Right ventricular systolic function is normal. The right ventricular  size is normal.   3. The mitral valve is normal in structure. Mild to moderate mitral valve  regurgitation. No evidence of mitral stenosis.   4. The aortic valve was not well visualized. Aortic valve regurgitation  is not visualized. Aortic valve sclerosis/calcification is present,  without any evidence of aortic stenosis. Aortic valve area, by VTI  measures 2.03 cm. Aortic valve mean gradient  measures 4.5 mmHg.   5. The inferior vena cava is normal in size with greater than 50%  respiratory variability, suggesting right atrial pressure of 3 mmHg.  __________   LHC 04/15/2021:   Ost RCA to Prox RCA lesion is 30% stenosed.   Dist RCA lesion is 40% stenosed.   1st Mrg lesion is 50% stenosed.   Mid LAD lesion is 40% stenosed.    1st Diag lesion is 70% stenosed.   1.  Moderately to severely calcified coronary arteries.  Overall mild to moderate nonobstructive disease affecting the main vessels.  There is a 70% stenosis in the superior branch of a large first diagonal. 2.  Left ventricular angiography was not performed due to chronic kidney disease.  EF was normal by echo. 3.  Mildly elevated left ventricular end-diastolic pressure at 20 mmHg.   Recommendations: Recommend attempted medical therapy and optimizing blood pressure.  I added Imdur 30 mg daily.  Continue treatment of risk factors.  PCI of the diagonal can be considered but only if she has refractory angina in spite of medical therapy. __________   2D echo 01/25/2021: 1. Left ventricular ejection fraction, by estimation, is 60 to 65%. The  left ventricle has normal function. Left ventricular endocardial border  not optimally defined to evaluate regional wall motion. Left ventricular  diastolic parameters are consistent  with Grade I diastolic dysfunction (impaired relaxation). Elevated left  atrial pressure.   2. Right ventricular systolic function is normal. The right ventricular  size is normal. There is normal pulmonary artery systolic pressure.   3. The mitral valve is grossly normal. Mild to moderate mitral valve  regurgitation. No evidence of mitral stenosis.   4. The aortic valve was not well visualized. Aortic valve regurgitation  is not visualized. Mild aortic valve stenosis.   5. The inferior vena cava is normal in size with greater than 50%  respiratory variability, suggesting right atrial pressure of 3 mmHg. __________   Elwyn Reach patch 04/2019: Patient had a min HR of 53 bpm, max HR of 187 bpm, and avg HR of 88 bpm. Predominant underlying rhythm was Sinus Rhythm. 1 run of Ventricular Tachycardia occurred lasting 11 beats with a max rate of 148 bpm (avg 130 bpm). 23 Supraventricular Tachycardia runs occurred, the run with the fastest interval  lasting 7 beats with a max rate of 187 bpm, the longest lasting 42.7 secs with an avg rate of 124 bpm. Isolated SVEs were rare (<1.0%), SVE Couplets were rare (<1.0%), and SVE Triplets were rare (<1.0%). Isolated VEs were rare (<1.0%), 1 patient triggered event was noted associated with sinus rhythm. ___________   Carlton Adam MPI 03/31/2019: The study is normal. This is a low risk study. The left ventricular ejection fraction is hyperdynamic (>65%). ____________   2D echo 03/27/2017: - Left ventricle: The cavity size was normal. Wall thickness was    increased in a pattern of mild LVH. Systolic function was    vigorous. The estimated ejection fraction was in the range of 65%    to 70%. Wall motion was normal; there were no regional wall    motion abnormalities. Doppler parameters are consistent with    abnormal left ventricular relaxation (grade 1 diastolic    dysfunction).  - Right ventricle: The cavity size was normal. Systolic function    was normal.  - Pulmonary arteries: Systolic pressure was mildly increased, in the range of 35 mm Hg to 40 mm Hg.   EKG:  EKG is ordered today.  The EKG ordered today demonstrates sinus tachycardia, 106 bpm, nonspecific ST-T changes, consistent with prior tracing  Recent Labs: 01/25/2022: ALT 14; BUN 24; Creatinine, Ser 1.48; Hemoglobin 11.9; Platelets 188; Potassium 4.4; Sodium 138  Recent Lipid Panel    Component Value Date/Time   CHOL 113 03/23/2021 1012   CHOL 144 07/03/2014 0526   TRIG 93 03/23/2021 1012   TRIG 320 (H) 07/03/2014 0526   HDL 45 03/23/2021 1012   HDL 33 (L) 07/03/2014 0526   CHOLHDL 2.5 03/23/2021 1012   CHOLHDL 3.0 01/29/2020 1515   VLDL 37 (H) 11/10/2016 1531   VLDL 64 (H) 07/03/2014 0526   LDLCALC 50 03/23/2021 1012   LDLCALC 73 01/29/2020 1515   LDLCALC 47 07/03/2014 0526    PHYSICAL EXAM:    VS:  BP 101/71 (BP Location: Right Arm, Patient Position: Sitting, Cuff Size: Normal)   Pulse (!) 106   Ht 5\' 7"  (1.702  m)   Wt 139 lb (63 kg)   SpO2 94%   BMI 21.77 kg/m   BMI: Body mass index is 21.77 kg/m.  Physical Exam Vitals reviewed.  Constitutional:      Appearance: She is well-developed.  HENT:     Head: Normocephalic and atraumatic.  Eyes:     General:        Right eye: No discharge.        Left eye: No discharge.  Neck:     Vascular: No JVD.  Cardiovascular:     Rate and Rhythm: Normal rate and regular rhythm.     Heart sounds: S1 normal and S2 normal. Heart sounds not distant. No midsystolic click and no opening snap. Murmur heard.     Harsh midsystolic murmur is present with a grade of 1/6 at the upper right sternal border radiating to the neck.     No friction rub.  Pulmonary:     Effort: Pulmonary effort is normal. No respiratory distress.     Breath sounds: Normal breath sounds. No decreased breath sounds, wheezing or rales.  Chest:     Chest wall: No tenderness.  Abdominal:     General: There is no distension.     Palpations: Abdomen is soft.     Tenderness: There is no abdominal tenderness.  Musculoskeletal:     Cervical  back: Normal range of motion.  Skin:    General: Skin is warm and dry.     Nails: There is no clubbing.  Neurological:     Mental Status: She is alert and oriented to person, place, and time.  Psychiatric:        Speech: Speech normal.        Behavior: Behavior normal.        Thought Content: Thought content normal.        Judgment: Judgment normal.     Wt Readings from Last 3 Encounters:  05/02/22 139 lb (63 kg)  04/18/22 138 lb (62.6 kg)  04/05/22 138 lb 9.6 oz (62.9 kg)     ASSESSMENT & PLAN:   Nonobstructive CAD with other forms of angina and progressive exertional dyspnea concerning for anginal equivalent: She did note some improvement, though not resolution with escalation of antianginal therapy with low-dose amlodipine.  However, she does continue to note progressive exertional dyspnea and fatigue.  Relative hypotension with prior  orthostasis precludes escalation of amlodipine, titration of Imdur, or addition of metoprolol for added antianginal effect.  I do suspect her dyspnea is likely multifactorial including underlying CAD, COPD, history of non-small cell lung cancer status post SBRT and radiation therapy with PET/CT in 01/2022 showing no suspicious findings or residual/recurrent tumor, and aortic atherosclerosis with some degree of deconditioning.  Given progressive symptoms, we will pursue diagnostic R/LHC to help understand her hemodynamics and guide medical therapy.  Addition of ranolazine is not a good option given creatinine clearance of 28 on most recent labs.  Continue aggressive risk factor modification including aspirin and atorvastatin as well as medical therapy as outlined above.  Prior PFTs in 12/2020 demonstrated mild COPD and were not felt to completely explain her dyspnea.  Paroxysmal SVT: Noted on prior outpatient cardiac monitoring.  No longer on beta-blocker secondary to orthostasis.   Aortic valve stenosis: Echo in 01/2022 showed aortic valve sclerosis without evidence of stenosis.   HLD: LDL 58 in 03/2021.  She remains on atorvastatin 40 mg.   CKD stage IIIb: Stable on last check.  Followed by nephrology.    History of TIA: Prior documentation indicates possibility of patient previously being on warfarin.  No objective evidence of A-fib available for review with chart biopsy.  She remains on aspirin and atorvastatin.   Shared Decision Making/Informed Consent{  The risks [stroke (1 in 1000), death (1 in 1000), kidney failure [usually temporary] (1 in 500), bleeding (1 in 200), allergic reaction [possibly serious] (1 in 200)], benefits (diagnostic support and management of coronary artery disease) and alternatives of a cardiac catheterization were discussed in detail with Ms. Sorci and she is willing to proceed.     Disposition: F/u with me 2 weeks post cath.   Medication Adjustments/Labs and Tests  Ordered: Current medicines are reviewed at length with the patient today.  Concerns regarding medicines are outlined above. Medication changes, Labs and Tests ordered today are summarized above and listed in the Patient Instructions accessible in Encounters.   Signed, Christell Faith, PA-C 05/02/2022 3:41 PM     [address] [place], [place] [id number] [phone]

## 2022-04-29 NOTE — Progress Notes (Signed)
Cardiology Office Note    Date:  05/02/2022   ID:  Christine Burnett 10-Oct-1940, MRN [id number]  PCP:  Christine Grana, PA-C  Cardiologist:  Kate Sable, MD  Electrophysiologist:  None   Chief Complaint: Follow up  History of Present Illness:   Christine Burnett is a 81 y.o. female with history of nonobstructive CAD by [place] on 04/2021, PSVT, mild aortic valve stenosis, TIA, HLD, non-small cell lung cancer s/p SBRT in 08/2020, CKD stage IIIb, COPD, compression fracture, and prior tobacco use who presents for follow-up of nonobstructive CAD and aortic stenosis.   Nuclear stress test in 03/2019 showed no evidence of ischemia and was low risk with an LVEF greater than 65%.   Outpatient cardiac monitoring in 04/2019 demonstrated a predominant rhythm of sinus with 1 run of NSVT lasting 11 beats, 23 episodes of SVT with the longest interval lasting 42.7 seconds.   Echo in 01/2021 demonstrated an EF of 60 to 17%, grade 1 diastolic dysfunction, normal RV systolic function and ventricular cavity size, normal PASP, mild to moderate mitral valve regurgitation, mild aortic valve stenosis, and an estimated right atrial pressure of 3 mmHg.   Prior CT imaging of the chest demonstrated significant LAD and RCA calcifications.  In the context of chest pain, she underwent LHC in 04/2021, that demonstrated moderately to severely calcified coronary arteries with overall mild to moderate nonobstructive disease affecting the main vessels.  There was a 70% stenosis in the superior branch of a large first diagonal.  Mildly elevated LVEDP at 20 mmHg.  Medical therapy was recommended with PCI of the diagonal to be considered if she had refractory angina despite optimization of antianginal therapy.   She was seen in the office in 06/2021 and was without symptoms of angina or decompensation.  She did note dizziness with blood pressure soft at 87/64.  Imdur was reduced to 15 mg daily.   She was seen in the office  on 01/02/2022 continuing to note chronic stable exertional dyspnea and chest heaviness that was unchanged dating back to late 2022, which led up to her cardiac cath which showed nonobstructive disease.  She did continue to note some positional dizziness without frank syncope.  Her weight was down 7 pounds when compared to her prior clinic visit.  Echo on 01/26/2022 demonstrated an EF of 60 to 65%, no regional wall motion abnormalities, mild LVH, grade 1 diastolic dysfunction, normal RV systolic function and ventricular cavity size, mild to moderate mitral regurgitation, aortic valve sclerosis without evidence of stenosis, and an estimated right atrial pressure of 3 mmHg.  She was last seen in the office on 04/18/2022 and continued to note chronic dyspnea as well as sharp split-second lasting intermittent migratory left-sided chest discomfort that was more noticeable when sitting on her sofa or laying in bed.  She did wonder if she was more short of breath when laying down or if this is just in the setting of getting ready for bed, as well as dyspnea at times when ambulating to and from her mailbox.  She was without exertional chest pain.  She was started on amlodipine 2.5 mg in an effort to further optimize her antianginal therapy.    She comes in accompanied by her daughter today.  Since initiating amlodipine, she has noted an improvement, though not resolution of her chest discomfort.  She continues to note progressive exertional dyspnea and fatigue, as well as wheezing.  She is having to use her supplemental oxygen during  daylight hours now whereas previously she was using this just nocturnally.  She feels like she does not have enough energy to perform prior tasks like she used to.  No significant lower extremity swelling.  She does continue to note some mild positional dizziness, though was without symptoms of presyncope or syncope.   Labs independently reviewed: 01/2022 - Hgb 11.9, PLT 188, potassium 4.4,  BUN 24, serum creatinine 1.48 09/2021 - albumin 3.8, AST/ALT normal 03/2021 - TC 113, TG 93, HDL 45, LDL 50 08/2018 - TSH normal, A1c 5.7  Past Medical History:  Diagnosis Date   Allergic rhinitis    Anxiety    Arthritis Me   Chronic bronchitis (HCC)    Chronic kidney disease, stage III (moderate) ([place]) 08/31/2017   Seeing nephrologist   CKD (chronic kidney disease), stage III (HCC)    COPD (chronic obstructive pulmonary disease) ([place])    Cornea scar    Depression    unspecified   Diverticulosis    Emphysema of lung ([place]) Me   Hx of fracture of left hip 12/24/2017   Hyperlipidemia    Hyperparathyroidism, 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   Managed by nephrologist   Non-small cell lung cancer (NSCLC) ([place])    Osteoporosis    Oxygen deficiency Me   Risk for falls    Situational disturbance    Stroke ([place]) 2011   tia's x 2   TIA (transient ischemic attack)     Past Surgical History:  Procedure Laterality Date   COLONOSCOPY  2016   ESOPHAGUS SURGERY     INTRAMEDULLARY (IM) NAIL INTERTROCHANTERIC Left 03/26/2017   Procedure: INTRAMEDULLARY (IM) NAIL INTERTROCHANTRIC;  Surgeon: Hessie Knows, MD;  Location: [facility];  Service: Orthopedics;  Laterality: Left;   LEFT HEART CATH AND CORONARY ANGIOGRAPHY N/A 04/15/2021   Procedure: LEFT HEART CATH AND CORONARY ANGIOGRAPHY;  Surgeon: Wellington Hampshire, MD;  Location: [facility];  Service: Cardiovascular;  Laterality: N/A;   VIDEO BRONCHOSCOPY WITH ENDOBRONCHIAL NAVIGATION Right 06/28/2020   Procedure: VIDEO BRONCHOSCOPY WITH ENDOBRONCHIAL NAVIGATION CELLVIZIO;  Surgeon: Tyler Pita, MD;  Location: [facility];  Service: Pulmonary;  Laterality: Right;    Current Medications: Current Meds  Medication Sig   acetaminophen (TYLENOL) 650 MG CR tablet Take 1 tablet (650 mg total) by mouth 4 (four) times daily as needed for pain.   albuterol (VENTOLIN HFA) 108 (90 Base) MCG/ACT inhaler Inhale 2 puffs into the lungs every 6  (six) hours as needed for wheezing or shortness of breath.   amLODipine (NORVASC) 2.5 MG tablet Take 1 tablet (2.5 mg total) by mouth daily.   aspirin 81 MG EC tablet Take 81 mg by mouth at bedtime. Swallow whole.   atorvastatin (LIPITOR) 40 MG tablet Take 1 tablet (40 mg total) by mouth at bedtime.   Calcium 200 MG TABS Take 300 mg by mouth every Monday, Tuesday, Wednesday, Thursday, and Friday.   Cholecalciferol (VITAMIN D3) 50 MCG (2000 UT) TABS Take 2,000 Units by mouth at bedtime.   isosorbide mononitrate (IMDUR) 30 MG 24 hr tablet Take 0.5 tablets (15 mg total) by mouth daily.   lidocaine (LIDODERM) 5 % Place 1 patch onto the skin daily. Remove & Discard patch within 12 hours or as directed by MD   melatonin 3 MG TABS tablet Take 3 mg by mouth at bedtime.   Multiple Vitamin (MULTIVITAMIN) capsule Take 1 capsule by mouth daily.   PARoxetine (PAXIL) 40 MG tablet TAKE 1 TABLET BY MOUTH ONCE DAILY  IN THE MORNING   Probiotic Product (PROBIOTIC PO) Take 1 capsule by mouth at bedtime.    Allergies:   Zithromax [azithromycin], Cefuroxime axetil, Ciprofloxacin, and Codeine   Social History   Socioeconomic History   Marital status: Divorced    Spouse name: Not on file   Number of children: 4   Years of education: Not on file   Highest education level: 12th grade  Occupational History   Occupation: Retired    Comment: payroll  Tobacco Use   Smoking status: Former    Packs/day: 1.00    Years: 61.00    Total pack years: 61.00    Types: Cigarettes    Quit date: 03/25/2017    Years since quitting: 5.1   Smokeless tobacco: Never  Vaping Use   Vaping Use: Never used  Substance and Sexual Activity   Alcohol use: No   Drug use: No   Sexual activity: Not Currently  Other Topics Concern   Not on file  Social History Narrative   Full Code   4 children   Denies alcohol use   Patient has 1 daughters and 1 grandson living with her   Social Determinants of Health   Financial Resource  Strain: [place]  (02/08/2021)   Overall Financial Resource Strain (CARDIA)    Difficulty of Paying Living Expenses: Not hard at all  Food Insecurity: No Food Insecurity (02/08/2021)   Hunger Vital Sign    Worried About Running Out of Food in the Last Year: Never true    [place] in the Last Year: Never true  Transportation Needs: No Transportation Needs (02/08/2021)   PRAPARE - Hydrologist (Medical): No    Lack of Transportation (Non-Medical): No  Physical Activity: Inactive (02/08/2021)   Exercise Vital Sign    Days of Exercise per Week: 0 days    Minutes of Exercise per Session: 0 min  Stress: Stress Concern Present (02/08/2021)   [place]    Feeling of Stress : Rather much  Social Connections: Moderately Isolated (02/08/2021)   Social Connection and Isolation Panel [NHANES]    Frequency of Communication with Friends and Family: More than three times a week    Frequency of Social Gatherings with Friends and Family: More than three times a week    Attends Religious Services: More than 4 times per year    Active Member of [employer] or Organizations: No    Attends Music therapist: Never    Marital Status: Divorced     Family History:  The patient's family history includes Cancer in her father; Diabetes in her brother; Heart attack in her brother and father; Heart disease in her father; Hypertension in her daughter; Kidney disease in her mother; Other in her mother; Prostate cancer in her father. There is no history of Kidney cancer, Bladder Cancer, Colon cancer, Colon polyps, or Rectal cancer.  ROS:   Review of Systems  Constitutional:  Positive for malaise/fatigue. Negative for chills, diaphoresis, fever and weight loss.  HENT:  Negative for congestion.   Eyes:  Negative for discharge and redness.  Respiratory:  Positive for shortness of breath and wheezing. Negative for cough  and sputum production.   Cardiovascular:  Positive for chest pain. Negative for palpitations, orthopnea, claudication, leg swelling and PND.  Gastrointestinal:  Negative for abdominal pain, heartburn, nausea and vomiting.  Musculoskeletal:  Negative for falls and myalgias.  Skin:  Negative for rash.  Neurological:  Negative for dizziness, tingling, tremors, sensory change, speech change, focal weakness and loss of consciousness.  Endo/Heme/Allergies:  Does not bruise/bleed easily.  Psychiatric/Behavioral:  Negative for substance abuse. The patient is not nervous/anxious.   All other systems reviewed and are negative.    EKGs/Labs/Other Studies Reviewed:    Studies reviewed were summarized above. The additional studies were reviewed today:  2D echo 01/26/2022: 1. Left ventricular ejection fraction, by estimation, is 60 to 65%. The  left ventricle has normal function. The left ventricle has no regional  wall motion abnormalities. There is mild left ventricular hypertrophy.  Left ventricular diastolic parameters  are consistent with Grade I diastolic dysfunction (impaired relaxation).   2. Right ventricular systolic function is normal. The right ventricular  size is normal.   3. The mitral valve is normal in structure. Mild to moderate mitral valve  regurgitation. No evidence of mitral stenosis.   4. The aortic valve was not well visualized. Aortic valve regurgitation  is not visualized. Aortic valve sclerosis/calcification is present,  without any evidence of aortic stenosis. Aortic valve area, by VTI  measures 2.03 cm. Aortic valve mean gradient  measures 4.5 mmHg.   5. The inferior vena cava is normal in size with greater than 50%  respiratory variability, suggesting right atrial pressure of 3 mmHg.  __________   LHC 04/15/2021:   Ost RCA to Prox RCA lesion is 30% stenosed.   Dist RCA lesion is 40% stenosed.   1st Mrg lesion is 50% stenosed.   Mid LAD lesion is 40% stenosed.    1st Diag lesion is 70% stenosed.   1.  Moderately to severely calcified coronary arteries.  Overall mild to moderate nonobstructive disease affecting the main vessels.  There is a 70% stenosis in the superior branch of a large first diagonal. 2.  Left ventricular angiography was not performed due to chronic kidney disease.  EF was normal by echo. 3.  Mildly elevated left ventricular end-diastolic pressure at 20 mmHg.   Recommendations: Recommend attempted medical therapy and optimizing blood pressure.  I added Imdur 30 mg daily.  Continue treatment of risk factors.  PCI of the diagonal can be considered but only if she has refractory angina in spite of medical therapy. __________   2D echo 01/25/2021: 1. Left ventricular ejection fraction, by estimation, is 60 to 65%. The  left ventricle has normal function. Left ventricular endocardial border  not optimally defined to evaluate regional wall motion. Left ventricular  diastolic parameters are consistent  with Grade I diastolic dysfunction (impaired relaxation). Elevated left  atrial pressure.   2. Right ventricular systolic function is normal. The right ventricular  size is normal. There is normal pulmonary artery systolic pressure.   3. The mitral valve is grossly normal. Mild to moderate mitral valve  regurgitation. No evidence of mitral stenosis.   4. The aortic valve was not well visualized. Aortic valve regurgitation  is not visualized. Mild aortic valve stenosis.   5. The inferior vena cava is normal in size with greater than 50%  respiratory variability, suggesting right atrial pressure of 3 mmHg. __________   Elwyn Reach patch 04/2019: Patient had a min HR of 53 bpm, max HR of 187 bpm, and avg HR of 88 bpm. Predominant underlying rhythm was Sinus Rhythm. 1 run of Ventricular Tachycardia occurred lasting 11 beats with a max rate of 148 bpm (avg 130 bpm). 23 Supraventricular Tachycardia runs occurred, the run with the fastest interval  lasting 7 beats with a max rate of 187 bpm, the longest lasting 42.7 secs with an avg rate of 124 bpm. Isolated SVEs were rare (<1.0%), SVE Couplets were rare (<1.0%), and SVE Triplets were rare (<1.0%). Isolated VEs were rare (<1.0%), 1 patient triggered event was noted associated with sinus rhythm. ___________   Carlton Adam MPI 03/31/2019: The study is normal. This is a low risk study. The left ventricular ejection fraction is hyperdynamic (>65%). ____________   2D echo 03/27/2017: - Left ventricle: The cavity size was normal. Wall thickness was    increased in a pattern of mild LVH. Systolic function was    vigorous. The estimated ejection fraction was in the range of 65%    to 70%. Wall motion was normal; there were no regional wall    motion abnormalities. Doppler parameters are consistent with    abnormal left ventricular relaxation (grade 1 diastolic    dysfunction).  - Right ventricle: The cavity size was normal. Systolic function    was normal.  - Pulmonary arteries: Systolic pressure was mildly increased, in the range of 35 mm Hg to 40 mm Hg.   EKG:  EKG is ordered today.  The EKG ordered today demonstrates sinus tachycardia, 106 bpm, nonspecific ST-T changes, consistent with prior tracing  Recent Labs: 01/25/2022: ALT 14; BUN 24; Creatinine, Ser 1.48; Hemoglobin 11.9; Platelets 188; Potassium 4.4; Sodium 138  Recent Lipid Panel    Component Value Date/Time   CHOL 113 03/23/2021 1012   CHOL 144 07/03/2014 0526   TRIG 93 03/23/2021 1012   TRIG 320 (H) 07/03/2014 0526   HDL 45 03/23/2021 1012   HDL 33 (L) 07/03/2014 0526   CHOLHDL 2.5 03/23/2021 1012   CHOLHDL 3.0 01/29/2020 1515   VLDL 37 (H) 11/10/2016 1531   VLDL 64 (H) 07/03/2014 0526   LDLCALC 50 03/23/2021 1012   LDLCALC 73 01/29/2020 1515   LDLCALC 47 07/03/2014 0526    PHYSICAL EXAM:    VS:  BP 101/71 (BP Location: Right Arm, Patient Position: Sitting, Cuff Size: Normal)   Pulse (!) 106   Ht 5\' 7"  (1.702  m)   Wt 139 lb (63 kg)   SpO2 94%   BMI 21.77 kg/m   BMI: Body mass index is 21.77 kg/m.  Physical Exam Vitals reviewed.  Constitutional:      Appearance: She is well-developed.  HENT:     Head: Normocephalic and atraumatic.  Eyes:     General:        Right eye: No discharge.        Left eye: No discharge.  Neck:     Vascular: No JVD.  Cardiovascular:     Rate and Rhythm: Normal rate and regular rhythm.     Heart sounds: S1 normal and S2 normal. Heart sounds not distant. No midsystolic click and no opening snap. Murmur heard.     Harsh midsystolic murmur is present with a grade of 1/6 at the upper right sternal border radiating to the neck.     No friction rub.  Pulmonary:     Effort: Pulmonary effort is normal. No respiratory distress.     Breath sounds: Normal breath sounds. No decreased breath sounds, wheezing or rales.  Chest:     Chest wall: No tenderness.  Abdominal:     General: There is no distension.     Palpations: Abdomen is soft.     Tenderness: There is no abdominal tenderness.  Musculoskeletal:     Cervical  back: Normal range of motion.  Skin:    General: Skin is warm and dry.     Nails: There is no clubbing.  Neurological:     Mental Status: She is alert and oriented to person, place, and time.  Psychiatric:        Speech: Speech normal.        Behavior: Behavior normal.        Thought Content: Thought content normal.        Judgment: Judgment normal.     Wt Readings from Last 3 Encounters:  05/02/22 139 lb (63 kg)  04/18/22 138 lb (62.6 kg)  04/05/22 138 lb 9.6 oz (62.9 kg)     ASSESSMENT & PLAN:   Nonobstructive CAD with other forms of angina and progressive exertional dyspnea concerning for anginal equivalent: She did note some improvement, though not resolution with escalation of antianginal therapy with low-dose amlodipine.  However, she does continue to note progressive exertional dyspnea and fatigue.  Relative hypotension with prior  orthostasis precludes escalation of amlodipine, titration of Imdur, or addition of metoprolol for added antianginal effect.  I do suspect her dyspnea is likely multifactorial including underlying CAD, COPD, history of non-small cell lung cancer status post SBRT and radiation therapy with PET/CT in 01/2022 showing no suspicious findings or residual/recurrent tumor, and aortic atherosclerosis with some degree of deconditioning.  Given progressive symptoms, we will pursue diagnostic R/LHC to help understand her hemodynamics and guide medical therapy.  Addition of ranolazine is not a good option given creatinine clearance of 28 on most recent labs.  Continue aggressive risk factor modification including aspirin and atorvastatin as well as medical therapy as outlined above.  Prior PFTs in 12/2020 demonstrated mild COPD and were not felt to completely explain her dyspnea.  Paroxysmal SVT: Noted on prior outpatient cardiac monitoring.  No longer on beta-blocker secondary to orthostasis.   Aortic valve stenosis: Echo in 01/2022 showed aortic valve sclerosis without evidence of stenosis.   HLD: LDL 58 in 03/2021.  She remains on atorvastatin 40 mg.   CKD stage IIIb: Stable on last check.  Followed by nephrology.    History of TIA: Prior documentation indicates possibility of patient previously being on warfarin.  No objective evidence of A-fib available for review with chart biopsy.  She remains on aspirin and atorvastatin.   Shared Decision Making/Informed Consent{  The risks [stroke (1 in 1000), death (1 in 1000), kidney failure [usually temporary] (1 in 500), bleeding (1 in 200), allergic reaction [possibly serious] (1 in 200)], benefits (diagnostic support and management of coronary artery disease) and alternatives of a cardiac catheterization were discussed in detail with Ms. Palazzi and she is willing to proceed.     Disposition: F/u with me 2 weeks post cath.   Medication Adjustments/Labs and Tests  Ordered: Current medicines are reviewed at length with the patient today.  Concerns regarding medicines are outlined above. Medication changes, Labs and Tests ordered today are summarized above and listed in the Patient Instructions accessible in Encounters.   Signed, Christell Faith, PA-C 05/02/2022 3:41 PM     [address] [place], [place] [address] [phone]

## 2022-05-02 ENCOUNTER — Encounter: Payer: Self-pay | Admitting: Physician Assistant

## 2022-05-02 ENCOUNTER — Ambulatory Visit: Payer: Medicare Other | Attending: Physician Assistant | Admitting: Physician Assistant

## 2022-05-02 ENCOUNTER — Other Ambulatory Visit
Admission: RE | Admit: 2022-05-02 | Discharge: 2022-05-02 | Disposition: A | Discharge status: Home / Self Care | Payer: Medicare Other | Source: Ambulatory Visit | Attending: Physician Assistant | Admitting: Physician Assistant

## 2022-05-02 VITALS — BP 101/71 | HR 106 | Ht 67.0 in | Wt 139.0 lb

## 2022-05-02 DIAGNOSIS — I471 Supraventricular tachycardia, unspecified: Secondary | ICD-10-CM | POA: Insufficient documentation

## 2022-05-02 DIAGNOSIS — N1832 Chronic kidney disease, stage 3b: Secondary | ICD-10-CM | POA: Insufficient documentation

## 2022-05-02 DIAGNOSIS — J449 Chronic obstructive pulmonary disease, unspecified: Secondary | ICD-10-CM | POA: Diagnosis not present

## 2022-05-02 DIAGNOSIS — I2510 Atherosclerotic heart disease of native coronary artery without angina pectoris: Secondary | ICD-10-CM | POA: Diagnosis not present

## 2022-05-02 DIAGNOSIS — R0609 Other forms of dyspnea: Secondary | ICD-10-CM | POA: Diagnosis not present

## 2022-05-02 DIAGNOSIS — I350 Nonrheumatic aortic (valve) stenosis: Secondary | ICD-10-CM | POA: Diagnosis not present

## 2022-05-02 DIAGNOSIS — R5383 Other fatigue: Secondary | ICD-10-CM | POA: Insufficient documentation

## 2022-05-02 DIAGNOSIS — Z8673 Personal history of transient ischemic attack (TIA), and cerebral infarction without residual deficits: Secondary | ICD-10-CM | POA: Diagnosis not present

## 2022-05-02 DIAGNOSIS — E785 Hyperlipidemia, unspecified: Secondary | ICD-10-CM | POA: Insufficient documentation

## 2022-05-02 DIAGNOSIS — I25118 Atherosclerotic heart disease of native coronary artery with other forms of angina pectoris: Secondary | ICD-10-CM | POA: Insufficient documentation

## 2022-05-02 LAB — BASIC METABOLIC PANEL
Anion gap: 6 (ref 5–15)
BUN: 36 mg/dL — ABNORMAL HIGH (ref 8–23)
CO2: 27 mmol/L (ref 22–32)
Calcium: 9.7 mg/dL (ref 8.9–10.3)
Chloride: 106 mmol/L (ref 98–111)
Creatinine, Ser: 1.62 mg/dL — ABNORMAL HIGH (ref 0.44–1.00)
GFR, Estimated: 32 mL/min — ABNORMAL LOW (ref >60)
Glucose, Bld: 99 mg/dL (ref 70–99)
Potassium: 3.9 mmol/L (ref 3.5–5.1)
Sodium: 139 mmol/L (ref 135–145)

## 2022-05-02 LAB — CBC
HCT: 35.1 % — ABNORMAL LOW (ref 36.0–46.0)
Hemoglobin: 11.7 g/dL — ABNORMAL LOW (ref 12.0–15.0)
MCH: 32.7 pg (ref 26.0–34.0)
MCHC: 33.3 g/dL (ref 30.0–36.0)
MCV: 98 fL (ref 80.0–100.0)
Platelets: 217 10*3/uL (ref 150–400)
RBC: 3.58 MIL/uL — ABNORMAL LOW (ref 3.87–5.11)
RDW: 13.8 % (ref 11.5–15.5)
WBC: 5.7 10*3/uL (ref 4.0–10.5)
nRBC: 0 % (ref 0.0–0.2)

## 2022-05-02 NOTE — Patient Instructions (Signed)
Medication Instructions:  No changes at this time.   *If you need a refill on your cardiac medications before your next appointment, please call your pharmacy*   Lab Work: None  If you have labs (blood work) drawn today and your tests are completely normal, you will receive your results only by: [place] (if you have MyChart) OR A paper copy in the mail If you have any lab test that is abnormal or we need to change your treatment, we will call you to review the results.   Testing/Procedures:  [place] A DEPT OF [place] A DEPT OF [place] [place], [place] [id number] Dept: [phone] Loc: [place]  05/02/2022  You are scheduled for a Cardiac Catheterization on ,   with Dr. [name].  1. Please arrive at the [facility] and check in at registration at .  Free valet parking service is available.   Special note: Every effort is made to have your procedure done on time. Please understand that emergencies sometimes delay scheduled procedures.  2. Diet: Do not eat solid foods after midnight.  The patient may have clear liquids until 5am upon the day of the procedure.  3. Labs: You will need to have blood drawn on Tuesday, November 28 at [facility], Go to 1st desk on your right to register.  Address: [address], [place] [address]  Open: 8am - 5pm  Phone: [phone]. You do not need to be fasting.  4. Medication instructions in preparation for your procedure:   Contrast Allergy: No  On the morning of your procedure, take your Aspirin 81 mg and any morning medicines NOT listed above.  You may use sips of water.  5. Plan for one night stay--bring personal belongings. 6. Bring a current list of your medications and current insurance cards. 7. You MUST have a responsible person to drive  you home. 8. Someone MUST be with you the first 24 hours after you arrive home or your discharge will be delayed. 9. Please wear clothes that are easy to get on and off and wear slip-on shoes.  Thank you for allowing [place] to care for you!   -- [facility]    Follow-Up: At [facility], you and your health needs are our priority.  As part of our continuing mission to provide you with exceptional heart care, we have created designated Provider Care Teams.  These Care Teams include your primary Cardiologist (physician) and Advanced Practice Providers (APPs -  Physician Assistants and Nurse Practitioners) who all work together to provide you with the care you need, when you need it.   Your next appointment:   2 week(s) after procedure.  The format for your next appointment:   In Person  Provider:   Kate Sable, MD or Christell Faith, PA-C       Important Information About [place]

## 2022-05-03 ENCOUNTER — Telehealth: Payer: Self-pay | Admitting: *Deleted

## 2022-05-03 NOTE — Telephone Encounter (Signed)
Called over to scheduling and requested date for heart cath. Patient is having a R/L heart cath which has been scheduled for 12/11 at 11:00 am. Will reach out to patients daughter with information.

## 2022-05-03 NOTE — Telephone Encounter (Signed)
Spoke with patients daughter and provided date and time of the procedure. R/L Heart Cath scheduled for 12/11 at 11:00 am with Dr. Fletcher Anon. She read back all information and had no further questions. Encouraged for her to give [place] a call back if she should have any questions. She verbalized understanding of our conversation with no further questions at this time.

## 2022-05-04 ENCOUNTER — Telehealth: Payer: Self-pay | Admitting: Physician Assistant

## 2022-05-04 ENCOUNTER — Other Ambulatory Visit: Payer: Self-pay | Admitting: Physician Assistant

## 2022-05-04 NOTE — Progress Notes (Signed)
Orders for cardiac cath signed and held.

## 2022-05-04 NOTE — Telephone Encounter (Signed)
Patient was in the office on 11/28, she is is not sure what medication she needs to stop taking taking.  She also wants to know the day of her cath.

## 2022-05-05 ENCOUNTER — Telehealth: Payer: Self-pay | Admitting: Cardiology

## 2022-05-05 NOTE — Telephone Encounter (Signed)
Spoke with patient that she does not need to hold any medications, reviewed date and time of her procedure, and she inquired about labs. Advised that we already had labs for her procedure and did not require any additional ones to be done. Reviewed that she could take all medications. She verbalized understanding of our conversation with no further questions at this time.

## 2022-05-05 NOTE — Telephone Encounter (Signed)
See other telephone encounter.   Spoke with patient that she does not need to hold any medications, reviewed date and time of her procedure, and she inquired about labs. Advised that we already had labs for her procedure and did not require any additional ones to be done. Reviewed that she could take all medications. She verbalized understanding of our conversation with no further questions at this time.

## 2022-05-05 NOTE — Telephone Encounter (Signed)
Patient called to see what medicines not to take before procedure.

## 2022-05-05 NOTE — Telephone Encounter (Signed)
Returned the call to the patient. She wanted to know what medications she needed to hold before the cath on 12/11 and when to get her labs. She was advised that she had labs on 11/28 so should be fine for the cath. She stated that she was told that she had to hold one medication before the cath and a MyChart message would be sent.

## 2022-05-09 ENCOUNTER — Ambulatory Visit: Payer: Medicare Other | Admitting: Physician Assistant

## 2022-05-15 ENCOUNTER — Encounter
Admission: RE | Disposition: A | Discharge status: Home / Self Care | Payer: Self-pay | Source: Home / Self Care | Attending: Cardiovascular Disease

## 2022-05-15 ENCOUNTER — Encounter: Payer: Self-pay | Admitting: Cardiovascular Disease

## 2022-05-15 ENCOUNTER — Ambulatory Visit
Admission: RE | Admit: 2022-05-15 | Discharge: 2022-05-15 | Disposition: A | Discharge status: Home / Self Care | Payer: Medicare Other | Attending: Cardiovascular Disease | Admitting: Cardiovascular Disease

## 2022-05-15 ENCOUNTER — Other Ambulatory Visit: Payer: Self-pay

## 2022-05-15 DIAGNOSIS — J449 Chronic obstructive pulmonary disease, unspecified: Secondary | ICD-10-CM | POA: Insufficient documentation

## 2022-05-15 DIAGNOSIS — Z85118 Personal history of other malignant neoplasm of bronchus and lung: Secondary | ICD-10-CM | POA: Diagnosis not present

## 2022-05-15 DIAGNOSIS — N1832 Chronic kidney disease, stage 3b: Secondary | ICD-10-CM | POA: Insufficient documentation

## 2022-05-15 DIAGNOSIS — R0609 Other forms of dyspnea: Secondary | ICD-10-CM

## 2022-05-15 DIAGNOSIS — Z79899 Other long term (current) drug therapy: Secondary | ICD-10-CM | POA: Diagnosis not present

## 2022-05-15 DIAGNOSIS — I25118 Atherosclerotic heart disease of native coronary artery with other forms of angina pectoris: Secondary | ICD-10-CM | POA: Diagnosis not present

## 2022-05-15 DIAGNOSIS — I2510 Atherosclerotic heart disease of native coronary artery without angina pectoris: Secondary | ICD-10-CM | POA: Diagnosis present

## 2022-05-15 DIAGNOSIS — Z87891 Personal history of nicotine dependence: Secondary | ICD-10-CM | POA: Diagnosis not present

## 2022-05-15 DIAGNOSIS — E785 Hyperlipidemia, unspecified: Secondary | ICD-10-CM | POA: Diagnosis not present

## 2022-05-15 DIAGNOSIS — I2584 Coronary atherosclerosis due to calcified coronary lesion: Secondary | ICD-10-CM | POA: Diagnosis not present

## 2022-05-15 DIAGNOSIS — I350 Nonrheumatic aortic (valve) stenosis: Secondary | ICD-10-CM | POA: Insufficient documentation

## 2022-05-15 DIAGNOSIS — Z8673 Personal history of transient ischemic attack (TIA), and cerebral infarction without residual deficits: Secondary | ICD-10-CM | POA: Insufficient documentation

## 2022-05-15 DIAGNOSIS — Z7982 Long term (current) use of aspirin: Secondary | ICD-10-CM | POA: Diagnosis not present

## 2022-05-15 HISTORY — PX: RIGHT/LEFT HEART CATH AND CORONARY ANGIOGRAPHY: CATH118266

## 2022-05-15 LAB — POCT I-STAT 7, (LYTES, BLD GAS, ICA,H+H)
Acid-base deficit: 4 mmol/L — ABNORMAL HIGH (ref 0.0–2.0)
Bicarbonate: 21.2 mmol/L (ref 20.0–28.0)
Calcium, Ion: 1.26 mmol/L (ref 1.15–1.40)
HCT: 31 % — ABNORMAL LOW (ref 36.0–46.0)
Hemoglobin: 10.5 g/dL — ABNORMAL LOW (ref 12.0–15.0)
O2 Saturation: 92 %
Potassium: 3.9 mmol/L (ref 3.5–5.1)
Sodium: 142 mmol/L (ref 135–145)
TCO2: 22 mmol/L (ref 22–32)
pCO2 arterial: 37.3 mmHg (ref 32–48)
pH, Arterial: 7.362 (ref 7.35–7.45)
pO2, Arterial: 65 mmHg — ABNORMAL LOW (ref 83–108)

## 2022-05-15 LAB — POCT I-STAT EG7
Acid-base deficit: 2 mmol/L (ref 0.0–2.0)
Bicarbonate: 22.9 mmol/L (ref 20.0–28.0)
Calcium, Ion: 1.3 mmol/L (ref 1.15–1.40)
HCT: 33 % — ABNORMAL LOW (ref 36.0–46.0)
Hemoglobin: 11.2 g/dL — ABNORMAL LOW (ref 12.0–15.0)
O2 Saturation: 64 %
Potassium: 4 mmol/L (ref 3.5–5.1)
Sodium: 141 mmol/L (ref 135–145)
TCO2: 24 mmol/L (ref 22–32)
pCO2, Ven: 41 mmHg — ABNORMAL LOW (ref 44–60)
pH, Ven: 7.355 (ref 7.25–7.43)
pO2, Ven: 35 mmHg (ref 32–45)

## 2022-05-15 SURGERY — RIGHT/LEFT HEART CATH AND CORONARY ANGIOGRAPHY
Anesthesia: Moderate Sedation | Laterality: Bilateral

## 2022-05-15 MED ORDER — FENTANYL CITRATE (PF) 100 MCG/2ML IJ SOLN
INTRAMUSCULAR | Status: AC
  Filled 2022-05-15: qty 2

## 2022-05-15 MED ORDER — SODIUM CHLORIDE 0.9% FLUSH: 3.0000 mL | Freq: Two times a day (BID) | INTRAVENOUS | Status: DC

## 2022-05-15 MED ORDER — VERAPAMIL HCL 2.5 MG/ML IV SOLN
INTRAVENOUS | Status: AC
  Filled 2022-05-15: qty 2

## 2022-05-15 MED ORDER — LIDOCAINE HCL (PF) 1 % IJ SOLN
INTRAMUSCULAR | Status: DC | PRN
  Administered 2022-05-15 (×2): 2 mL

## 2022-05-15 MED ORDER — SODIUM CHLORIDE 0.9 % IV SOLN: INTRAVENOUS | Status: DC

## 2022-05-15 MED ORDER — HEPARIN SODIUM (PORCINE) 1000 UNIT/ML IJ SOLN
INTRAMUSCULAR | Status: DC | PRN
  Administered 2022-05-15: 3000 [IU] via INTRAVENOUS

## 2022-05-15 MED ORDER — IOHEXOL 300 MG/ML  SOLN
INTRAMUSCULAR | Status: DC | PRN
  Administered 2022-05-15: 18 mL

## 2022-05-15 MED ORDER — SODIUM CHLORIDE 0.9 % IV SOLN: 250.0000 mL | INTRAVENOUS | Status: DC | PRN

## 2022-05-15 MED ORDER — HEPARIN SODIUM (PORCINE) 1000 UNIT/ML IJ SOLN
INTRAMUSCULAR | Status: AC
  Filled 2022-05-15: qty 10

## 2022-05-15 MED ORDER — ONDANSETRON HCL 4 MG/2ML IJ SOLN: 4.0000 mg | Freq: Four times a day (QID) | INTRAMUSCULAR | Status: DC | PRN

## 2022-05-15 MED ORDER — SODIUM CHLORIDE 0.9% FLUSH: 3.0000 mL | INTRAVENOUS | Status: DC | PRN

## 2022-05-15 MED ORDER — MIDAZOLAM HCL 2 MG/2ML IJ SOLN
INTRAMUSCULAR | Status: DC | PRN
  Administered 2022-05-15: 1 mg via INTRAVENOUS

## 2022-05-15 MED ORDER — FENTANYL CITRATE (PF) 100 MCG/2ML IJ SOLN
INTRAMUSCULAR | Status: DC | PRN
  Administered 2022-05-15: 25 ug via INTRAVENOUS

## 2022-05-15 MED ORDER — LIDOCAINE HCL 1 % IJ SOLN
INTRAMUSCULAR | Status: AC
  Filled 2022-05-15: qty 20

## 2022-05-15 MED ORDER — HEPARIN (PORCINE) IN NACL 1000-0.9 UT/500ML-% IV SOLN
INTRAVENOUS | Status: AC
  Filled 2022-05-15: qty 1000

## 2022-05-15 MED ORDER — HEPARIN (PORCINE) IN NACL 1000-0.9 UT/500ML-% IV SOLN
INTRAVENOUS | Status: DC | PRN
  Administered 2022-05-15 (×2): 500 mL

## 2022-05-15 MED ORDER — MIDAZOLAM HCL 2 MG/2ML IJ SOLN
INTRAMUSCULAR | Status: AC
  Filled 2022-05-15: qty 2

## 2022-05-15 MED ORDER — VERAPAMIL HCL 2.5 MG/ML IV SOLN
INTRAVENOUS | Status: DC | PRN
  Administered 2022-05-15: 2.5 mg via INTRA_ARTERIAL

## 2022-05-15 MED ORDER — ACETAMINOPHEN 325 MG PO TABS: 650.0000 mg | ORAL_TABLET | ORAL | Status: DC | PRN

## 2022-05-15 SURGICAL SUPPLY — 13 items
BAND ZEPHYR COMPRESS 30 LONG (HEMOSTASIS) IMPLANT
CATH INFINITI 5FR JK (CATHETERS) IMPLANT
CATH SWAN GANZ 7F STRAIGHT (CATHETERS) IMPLANT
DRAPE BRACHIAL (DRAPES) IMPLANT
GLIDESHEATH SLEND SS 6F .021 (SHEATH) IMPLANT
GLIDESHEATH SLENDER 7FR .021G (SHEATH) IMPLANT
GUIDEWIRE INQWIRE 1.5J.035X260 (WIRE) IMPLANT
INQWIRE 1.5J .035X260CM (WIRE) ×1
PACK CARDIAC CATH (CUSTOM PROCEDURE TRAY) ×1 IMPLANT
PROTECTION STATION PRESSURIZED (MISCELLANEOUS) ×1
SET ATX SIMPLICITY (MISCELLANEOUS) IMPLANT
STATION PROTECTION PRESSURIZED (MISCELLANEOUS) IMPLANT
WIRE HITORQ VERSACORE ST 145CM (WIRE) IMPLANT

## 2022-05-15 NOTE — Progress Notes (Signed)
Dr. Fletcher Anon speaking with pt. And her family re: cath results. Both verbalize understanding of conversation.

## 2022-05-15 NOTE — Interval H&P Note (Signed)
Cath Lab Visit (complete for each Cath Lab visit)  Clinical Evaluation Leading to the Procedure:   ACS: No.  Non-ACS:    Anginal Classification: CCS III  Anti-ischemic medical therapy: Maximal Therapy (2 or more classes of medications)  Non-Invasive Test Results: No non-invasive testing performed  Prior CABG: No previous CABG      History and Physical Interval Note:  05/15/2022 12:13 PM  Christine Burnett  has presented today for surgery, with the diagnosis of R and L Cath   CAD   Progression of dyspnea.  The various methods of treatment have been discussed with the patient and family. After consideration of risks, benefits and other options for treatment, the patient has consented to  Procedure(s): RIGHT/LEFT HEART CATH AND CORONARY ANGIOGRAPHY (Bilateral) as a surgical intervention.  The patient's history has been reviewed, patient examined, no change in status, stable for surgery.  I have reviewed the patient's chart and labs.  Questions were answered to the patient's satisfaction.     Christine Burnett

## 2022-05-15 NOTE — Progress Notes (Signed)
Delay in starting patient's pre op due to lack of bay availability.  Patient arrived on time, however we did not have space for her in special procedures prep / post area

## 2022-05-16 ENCOUNTER — Encounter: Payer: Self-pay | Admitting: Cardiovascular Disease

## 2022-05-26 ENCOUNTER — Encounter: Payer: Self-pay | Admitting: Cardiology

## 2022-05-26 ENCOUNTER — Ambulatory Visit: Payer: Medicare Other | Attending: Cardiology | Admitting: Cardiology

## 2022-05-26 VITALS — BP 95/69 | HR 96 | Ht 67.0 in | Wt 139.0 lb

## 2022-05-26 DIAGNOSIS — I2510 Atherosclerotic heart disease of native coronary artery without angina pectoris: Secondary | ICD-10-CM | POA: Diagnosis not present

## 2022-05-26 DIAGNOSIS — E782 Mixed hyperlipidemia: Secondary | ICD-10-CM | POA: Insufficient documentation

## 2022-05-26 NOTE — Progress Notes (Signed)
Cardiology Office Note:    Date:  05/26/2022   ID:  Trenace, Coughlin [birth year], MRN [id number]  PCP:  Delsa Grana, PA-C   [facility] Providers Cardiologist:  Kate Sable, MD     Referring MD: Delsa Grana, PA-C   Chief Complaint  Patient presents with   Follow-up    Follow up post heart cath. Patient states that she is fine and breathing better. Meds reviewed with patient.    History of Present Illness:    Christine Burnett is a 81 y.o. female with a hx of nonobstructive CAD ([place] 12/23 40%mLAD, 70%D1, 50% OM1, 30%, pRCA), anxiety, former smoker x50 years, COPD, hyperlipidemia, TIA who presents for follow-up.    Recently seen in the clinic for dyspnea, deemed to be an anginal equivalent.  Underwent left heart cath 05/2022 with mild to moderate disease.  Etiology for shortness of breath deemed noncardiac.  She feels well, blood pressure runs low sometimes.  States getting dizzy when she does not drink.  Denies chest pain.  Feels well otherwise, no concerns at this time.  Prior notes Echo 01/2021 EF 60 to 65% mild to moderate MR Left heart cath 04/2021, 40% mid LAD, 70% first diagonal, 40% RCA, 50% OM1 Lexiscan Myoview 03/2019 low risk study, no evidence for ischemia.  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   Managed by nephrologist   Non-small cell lung cancer (NSCLC) ([place])    Osteoporosis    Oxygen deficiency Me   Risk for falls    Situational disturbance    Stroke ([place]) 2011   tia's x  2   TIA (transient ischemic attack)     Past Surgical History:  Procedure Laterality Date   COLONOSCOPY  2016   ESOPHAGUS SURGERY     INTRAMEDULLARY (IM) NAIL INTERTROCHANTERIC Left 03/26/2017   Procedure: INTRAMEDULLARY (IM) NAIL INTERTROCHANTRIC;  Surgeon: Hessie Knows, MD;  Location: [facility];  Service: Orthopedics;  Laterality: Left;   LEFT HEART CATH AND CORONARY ANGIOGRAPHY N/A 04/15/2021   Procedure: LEFT HEART CATH AND CORONARY ANGIOGRAPHY;  Surgeon: Wellington Hampshire, MD;  Location: [place] [facility];  Service: Cardiovascular;  Laterality: N/A;   RIGHT/LEFT HEART CATH AND CORONARY ANGIOGRAPHY Bilateral 05/15/2022   Procedure: RIGHT/LEFT HEART CATH AND CORONARY ANGIOGRAPHY;  Surgeon: Wellington Hampshire, MD;  Location: [place] [facility];  Service: Cardiovascular;  Laterality: Bilateral;   VIDEO BRONCHOSCOPY WITH ENDOBRONCHIAL NAVIGATION Right 06/28/2020   Procedure: VIDEO BRONCHOSCOPY WITH ENDOBRONCHIAL NAVIGATION CELLVIZIO;  Surgeon: Tyler Pita, MD;  Location: [facility];  Service: Pulmonary;  Laterality: Right;    Current Medications: Current Meds  Medication Sig   acetaminophen (TYLENOL) 650 MG CR tablet Take 1 tablet (650 mg total) by mouth 4 (four) times daily as needed for pain.   albuterol (VENTOLIN HFA) 108 (90 Base) MCG/ACT inhaler Inhale 2 puffs into the lungs every 6 (six) hours as needed for wheezing or shortness of breath.  aspirin 81 MG EC tablet Take 81 mg by mouth at bedtime. Swallow whole.   atorvastatin (LIPITOR) 40 MG tablet Take 1 tablet (40 mg total) by mouth at bedtime.   Calcium 200 MG TABS Take 300 mg by mouth every Monday, Tuesday, Wednesday, Thursday, and Friday.   Cholecalciferol (VITAMIN D3) 50 MCG (2000 UT) TABS Take 2,000 Units by mouth at bedtime.   isosorbide mononitrate (IMDUR) 30 MG 24 hr tablet Take 0.5 tablets (15 mg total) by mouth daily.   lidocaine (LIDODERM) 5 % Place 1 patch onto the skin daily. Remove & Discard patch within 12  hours or as directed by MD   melatonin 3 MG TABS tablet Take 3 mg by mouth at bedtime.   Multiple Vitamin (MULTIVITAMIN) capsule Take 1 capsule by mouth daily.   PARoxetine (PAXIL) 40 MG tablet TAKE 1 TABLET BY MOUTH ONCE DAILY IN THE MORNING (Patient taking differently: Take 40 mg by mouth at bedtime.)   Probiotic Product (PROBIOTIC PO) Take 1 capsule by mouth at bedtime.   [DISCONTINUED] amLODipine (NORVASC) 2.5 MG tablet Take 1 tablet (2.5 mg total) by mouth daily.     Allergies:   Zithromax [azithromycin], Cefuroxime axetil, Ciprofloxacin, and Codeine   Social History   Socioeconomic History   Marital status: Divorced    Spouse name: Not on file   Number of children: 4   Years of education: Not on file   Highest education level: 12th grade  Occupational History   Occupation: Retired    Comment: payroll  Tobacco Use   Smoking status: Former    Packs/day: 1.00    Years: 61.00    Total pack years: 61.00    Types: Cigarettes    Quit date: 03/25/2017    Years since quitting: 5.1   Smokeless tobacco: Never  Vaping Use   Vaping Use: Never used  Substance and Sexual Activity   Alcohol use: No   Drug use: No   Sexual activity: Not Currently  Other Topics Concern   Not on file  Social History Narrative   Full Code   4 children   Denies alcohol use   Patient has 1 daughters and 1 grandson living with her   Social Determinants of Health   Financial Resource Strain: [place]  (02/08/2021)   Overall Financial Resource Strain (CARDIA)    Difficulty of Paying Living Expenses: Not hard at all  Food Insecurity: No Food Insecurity (02/08/2021)   Hunger Vital Sign    Worried About Running Out of Food in the Last Year: Never true    [place] in the Last Year: Never true  Transportation Needs: No Transportation Needs (02/08/2021)   PRAPARE - Hydrologist (Medical): No    Lack of Transportation (Non-Medical): No  Physical Activity: Inactive  (02/08/2021)   Exercise Vital Sign    Days of Exercise per Week: 0 days    Minutes of Exercise per Session: 0 min  Stress: Stress Concern Present (02/08/2021)   [place]    Feeling of Stress : Rather much  Social Connections: Moderately Isolated (02/08/2021)   Social Connection and Isolation Panel [NHANES]    Frequency of Communication with Friends and Family: More than three times a week    Frequency of Social Gatherings with Friends and Family: More than three times a week    Attends Religious Services: More than 4 times per year    Active  Member of Clubs or Organizations: No    Attends Music therapist: Never    Marital Status: Divorced     Family History: The patient's family history includes Cancer in her father; Diabetes in her brother; Heart attack in her brother and father; Heart disease in her father; Hypertension in her daughter; Kidney disease in her mother; Other in her mother; Prostate cancer in her father. There is no history of Kidney cancer, Bladder Cancer, Colon cancer, Colon polyps, or Rectal cancer.  ROS:   Please see the history of present illness.     All other systems reviewed and are negative.  EKGs/Labs/Other Studies Reviewed:    The following studies were reviewed today:   EKG:  EKG is ordered today.  EKG shows normal sinus rhythm.  Recent Labs: 01/25/2022: ALT 14 05/02/2022: BUN 36; Creatinine, Ser 1.62; Platelets 217 05/15/2022: Hemoglobin 10.5; Potassium 3.9; Sodium 142  Recent Lipid Panel    Component Value Date/Time   CHOL 113 03/23/2021 1012   CHOL 144 07/03/2014 0526   TRIG 93 03/23/2021 1012   TRIG 320 (H) 07/03/2014 0526   HDL 45 03/23/2021 1012   HDL 33 (L) 07/03/2014 0526   CHOLHDL 2.5 03/23/2021 1012   CHOLHDL 3.0 01/29/2020 1515   VLDL 37 (H) 11/10/2016 1531   VLDL 64 (H) 07/03/2014 0526   LDLCALC 50 03/23/2021 1012   LDLCALC 73 01/29/2020 1515   LDLCALC 47  07/03/2014 0526     Risk Assessment/Calculations:          Physical Exam:    VS:  BP 95/69 (BP Location: Right Arm, Patient Position: Sitting, Cuff Size: Normal)   Pulse 96   Ht 5\' 7"  (1.702 m)   Wt 139 lb (63 kg)   SpO2 96%   BMI 21.77 kg/m     Wt Readings from Last 3 Encounters:  05/26/22 139 lb (63 kg)  05/15/22 140 lb (63.5 kg)  05/02/22 139 lb (63 kg)     GEN:  Well nourished, well developed in no acute distress HEENT: Normal NECK: No JVD; No carotid bruits CARDIAC: RRR, no murmurs, rubs, gallops RESPIRATORY: Rhonchorous breath sounds, inspiratory wheezing noted at right lung base ABDOMEN: Soft, non-tender, non-distended MUSCULOSKELETAL:  No edema; No deformity  SKIN: Warm and dry NEUROLOGIC:  Alert and oriented x 3 PSYCHIATRIC:  Normal affect   ASSESSMENT:    1. Coronary artery disease, unspecified vessel or lesion type, unspecified whether angina present, unspecified whether native or transplanted heart   2. Mixed hyperlipidemia    PLAN:    In order of problems listed above:  Nonobstructive CAD, moderate to severe coronary calcification.  40% mid LAD, 70% first diagonal, 40% RCA, 50% OM1.  Denies chest pain.  BP low normal, stop Norvasc.  Continue aspirin 81 mg, Lipitor 40, Imdur 15 mg daily for antianginal benefit.  Echo with preserved ejection fraction, EF 60 to 65%.  Hyperlipidemia, cholesterol control, LDL at goal, continue Lipitor 40 mg daily.  Follow-up in 12 months.   Medication Adjustments/Labs and Tests Ordered: Current medicines are reviewed at length with the patient today.  Concerns regarding medicines are outlined above.  Orders Placed This Encounter  Procedures   EKG 12-Lead     No orders of the defined types were placed in this encounter.     Patient Instructions  Medication Instructions:  STOP the Amlodipine *If you need a refill on your cardiac medications before your next appointment, please call your pharmacy*   Lab  Work: None ordered If you have labs (blood work) drawn today and your tests are completely normal, you will receive your results only by: [place] (if you have MyChart) OR A paper copy in the mail If you have any lab test that is abnormal or we need to change your treatment, we will call you to review the results.   Testing/Procedures: None ordered   Follow-Up: At [facility], you and your health needs are our priority.  As part of our continuing mission to provide you with exceptional heart care, we have created designated Provider Care Teams.  These Care Teams include your primary Cardiologist (physician) and Advanced Practice Providers (APPs -  Physician Assistants and Nurse Practitioners) who all work together to provide you with the care you need, when you need it.  We recommend signing up for the patient portal called "MyChart".  Sign up information is provided on this After Visit Summary.  MyChart is used to connect with patients for Virtual Visits (Telemedicine).  Patients are able to view lab/test results, encounter notes, upcoming appointments, etc.  Non-urgent messages can be sent to your provider as well.   To learn more about what you can do with MyChart, go to [email or web address].    Your next appointment:   12 month(s)  The format for your next appointment:   In Person  Provider:   You may see Kate Sable, MD or one of the following Advanced Practice Providers on your designated Care Team:   Murray Hodgkins, NP Christell Faith, PA-C Cadence Kathlen Mody, PA-C Gerrie Nordmann, NP    Important Information About Sugar         Signed, Kate Sable, MD  05/26/2022 12:17 PM    [place]

## 2022-05-26 NOTE — Patient Instructions (Signed)
Medication Instructions:  STOP the Amlodipine *If you need a refill on your cardiac medications before your next appointment, please call your pharmacy*   Lab Work: None ordered If you have labs (blood work) drawn today and your tests are completely normal, you will receive your results only by: [place] (if you have MyChart) OR A paper copy in the mail If you have any lab test that is abnormal or we need to change your treatment, we will call you to review the results.   Testing/Procedures: None ordered   Follow-Up: At [facility], you and your health needs are our priority.  As part of our continuing mission to provide you with exceptional heart care, we have created designated Provider Care Teams.  These Care Teams include your primary Cardiologist (physician) and Advanced Practice Providers (APPs -  Physician Assistants and Nurse Practitioners) who all work together to provide you with the care you need, when you need it.  We recommend signing up for the patient portal called "MyChart".  Sign up information is provided on this After Visit Summary.  MyChart is used to connect with patients for Virtual Visits (Telemedicine).  Patients are able to view lab/test results, encounter notes, upcoming appointments, etc.  Non-urgent messages can be sent to your provider as well.   To learn more about what you can do with MyChart, go to [email or web address].    Your next appointment:   12 month(s)  The format for your next appointment:   In Person  Provider:   You may see Kate Sable, MD or one of the following Advanced Practice Providers on your designated Care Team:   Murray Hodgkins, NP Christell Faith, PA-C Cadence Kathlen Mody, PA-C Gerrie Nordmann, NP    Important Information About Sugar

## 2022-06-22 DIAGNOSIS — I959 Hypotension, unspecified: Secondary | ICD-10-CM | POA: Diagnosis not present

## 2022-06-22 DIAGNOSIS — N1832 Chronic kidney disease, stage 3b: Secondary | ICD-10-CM | POA: Diagnosis not present

## 2022-06-22 DIAGNOSIS — N2581 Secondary hyperparathyroidism of renal origin: Secondary | ICD-10-CM | POA: Diagnosis not present

## 2022-06-29 ENCOUNTER — Other Ambulatory Visit: Payer: Self-pay | Admitting: Nurse Practitioner

## 2022-06-29 DIAGNOSIS — F419 Anxiety disorder, unspecified: Secondary | ICD-10-CM

## 2022-06-29 DIAGNOSIS — F330 Major depressive disorder, recurrent, mild: Secondary | ICD-10-CM

## 2022-06-29 NOTE — Telephone Encounter (Signed)
Requested Prescriptions  Pending Prescriptions Disp Refills   PARoxetine (PAXIL) 40 MG tablet [Pharmacy Med Name: PARoxetine HCl 40 MG Oral Tablet] 90 tablet 0    Sig: TAKE 1 TABLET BY MOUTH ONCE DAILY IN THE MORNING     Psychiatry:  Antidepressants - SSRI Failed - 06/29/2022  1:49 PM      Failed - Valid encounter within last 6 months    Recent Outpatient Visits           7 months ago Rash and nonspecific skin eruption   [facility] Delsa Grana, PA-C   7 months ago No-show for appointment   [facility] Delsa Grana, PA-C   11 months ago Back pain without sciatica   [facility] Bo Merino, FNP   1 year ago Oral thrush   [facility] Teodora Medici, DO   1 year ago Mild episode of recurrent major depressive disorder [facility])   [facility] Bo Merino, FNP       Future Appointments             In 2 weeks Delsa Grana, [facility], [facility]

## 2022-07-10 ENCOUNTER — Other Ambulatory Visit: Payer: Self-pay

## 2022-07-10 ENCOUNTER — Other Ambulatory Visit: Payer: Self-pay | Admitting: Physician Assistant

## 2022-07-10 DIAGNOSIS — E782 Mixed hyperlipidemia: Secondary | ICD-10-CM

## 2022-07-10 MED ORDER — ATORVASTATIN CALCIUM 40 MG PO TABS: 40.0000 mg | ORAL_TABLET | Freq: Every day | ORAL | 2 refills | Status: DC

## 2022-07-18 ENCOUNTER — Ambulatory Visit: Payer: Medicare Other | Admitting: Family Medicine

## 2022-07-18 DIAGNOSIS — F331 Major depressive disorder, recurrent, moderate: Secondary | ICD-10-CM

## 2022-07-31 ENCOUNTER — Other Ambulatory Visit: Payer: Self-pay | Admitting: Family Medicine

## 2022-08-01 ENCOUNTER — Encounter: Payer: Self-pay | Admitting: Family Medicine

## 2022-08-01 ENCOUNTER — Ambulatory Visit (INDEPENDENT_AMBULATORY_CARE_PROVIDER_SITE_OTHER): Payer: Medicare Other | Admitting: Family Medicine

## 2022-08-01 VITALS — BP 136/80 | HR 100 | Temp 97.8°F | Resp 16 | Ht 67.0 in | Wt 138.9 lb

## 2022-08-01 DIAGNOSIS — R1310 Dysphagia, unspecified: Secondary | ICD-10-CM | POA: Diagnosis not present

## 2022-08-01 DIAGNOSIS — I10 Essential (primary) hypertension: Secondary | ICD-10-CM | POA: Diagnosis not present

## 2022-08-01 DIAGNOSIS — J449 Chronic obstructive pulmonary disease, unspecified: Secondary | ICD-10-CM | POA: Diagnosis not present

## 2022-08-01 DIAGNOSIS — R7303 Prediabetes: Secondary | ICD-10-CM | POA: Diagnosis not present

## 2022-08-01 DIAGNOSIS — N184 Chronic kidney disease, stage 4 (severe): Secondary | ICD-10-CM | POA: Diagnosis not present

## 2022-08-01 DIAGNOSIS — C3432 Malignant neoplasm of lower lobe, left bronchus or lung: Secondary | ICD-10-CM

## 2022-08-01 DIAGNOSIS — N2581 Secondary hyperparathyroidism of renal origin: Secondary | ICD-10-CM

## 2022-08-01 DIAGNOSIS — I25118 Atherosclerotic heart disease of native coronary artery with other forms of angina pectoris: Secondary | ICD-10-CM

## 2022-08-01 DIAGNOSIS — Z780 Asymptomatic menopausal state: Secondary | ICD-10-CM

## 2022-08-01 DIAGNOSIS — F330 Major depressive disorder, recurrent, mild: Secondary | ICD-10-CM | POA: Diagnosis not present

## 2022-08-01 DIAGNOSIS — E782 Mixed hyperlipidemia: Secondary | ICD-10-CM | POA: Diagnosis not present

## 2022-08-01 DIAGNOSIS — N1832 Chronic kidney disease, stage 3b: Secondary | ICD-10-CM

## 2022-08-01 DIAGNOSIS — F419 Anxiety disorder, unspecified: Secondary | ICD-10-CM

## 2022-08-01 NOTE — Progress Notes (Signed)
Name: Christine Burnett   MRN: [id number]    DOB: [birth year]   Date:08/01/2022       Progress Note  Chief Complaint  Patient presents with   Follow-up     Subjective:   Christine Burnett is a 82 y.o. female, presents to clinic for routine f/up  On lipitor recently refilled by cardiology  Lab Results  Component Value Date   CHOL 113 03/23/2021   HDL 45 03/23/2021   LDLCALC 50 03/23/2021   TRIG 93 03/23/2021   CHOLHDL 2.5 03/23/2021   On paxil for anxiety and depression- she and her daughter report it working fine and pt has good mood    08/01/2022    1:55 PM 11/08/2021   11:37 AM 11/07/2021   11:33 AM 07/27/2021   11:46 AM 03/24/2021   12:56 PM  Depression screen PHQ 2/9  Decreased Interest 0 2 1 0 0  Down, Depressed, Hopeless 0 3 1 0 0  PHQ - 2 Score 0 5 2 0 0  Altered sleeping 0 0 0  0  Tired, decreased energy 0 2 1  0  Change in appetite 0 1 1  0  Feeling bad or failure about yourself  0 0 0  0  Trouble concentrating 0 3 0  0  Moving slowly or fidgety/restless 0 0 0  0  Suicidal thoughts 0 0 0  0  PHQ-9 Score 0 11 4  0  Difficult doing work/chores Not difficult at all Somewhat difficult Somewhat difficult  Not difficult at all   She needs permanent handicap form signs  She's been seeing rad onc, onc, and pulm and complete tx, hx of COPD  CKD stage 3b seeing nephrology - last OV and labs reviewed through care everywhere and chart      Current Outpatient Medications:    acetaminophen (TYLENOL) 650 MG CR tablet, Take 1 tablet (650 mg total) by mouth 4 (four) times daily as needed for pain., Disp: , Rfl:    albuterol (VENTOLIN HFA) 108 (90 Base) MCG/ACT inhaler, Inhale 2 puffs into the lungs every 6 (six) hours as needed for wheezing or shortness of breath., Disp: 8 g, Rfl: 0   aspirin 81 MG EC tablet, Take 81 mg by mouth at bedtime. Swallow whole., Disp: , Rfl:    atorvastatin (LIPITOR) 40 MG tablet, Take 1 tablet (40 mg total) by mouth at bedtime., Disp: 90  tablet, Rfl: 2   Calcium 200 MG TABS, Take 300 mg by mouth every Monday, Tuesday, Wednesday, Thursday, and Friday., Disp: , Rfl:    Cholecalciferol (VITAMIN D3) 50 MCG (2000 UT) TABS, Take 2,000 Units by mouth at bedtime., Disp: , Rfl:    isosorbide mononitrate (IMDUR) 30 MG 24 hr tablet, Take 1/2 (one-half) tablet by mouth once daily, Disp: 45 tablet, Rfl: 1   melatonin 3 MG TABS tablet, Take 3 mg by mouth at bedtime., Disp: , Rfl:    Multiple Vitamin (MULTIVITAMIN) capsule, Take 1 capsule by mouth daily., Disp: , Rfl:    PARoxetine (PAXIL) 40 MG tablet, TAKE 1 TABLET BY MOUTH ONCE DAILY IN THE MORNING, Disp: 90 tablet, Rfl: 0   lidocaine (LIDODERM) 5 %, Place 1 patch onto the skin daily. Remove & Discard patch within 12 hours or as directed by MD, Disp: 30 patch, Rfl: 0   Probiotic Product (PROBIOTIC PO), Take 1 capsule by mouth at bedtime., Disp: , Rfl:   Patient Active Problem List   Diagnosis Date Noted  Dyspnea on exertion 05/15/2022   Coronary artery disease    COPD (chronic obstructive pulmonary disease) ([place]) 01/28/2021   CKD (chronic kidney disease) stage 4, GFR 15-29 ml/min ([place]) 01/21/2021   Goals of care, counseling/discussion 07/17/2020   Malignant neoplasm of lower lobe of right lung ([place]) 07/04/2020   Pneumonia due to COVID-19 virus 03/17/2020   Nocturnal hypoxia 03/17/2020   Prediabetes 08/13/2018   Allergic rhinitis 09/27/2017   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   Stage 3b chronic kidney disease ([place]) 08/31/2017   Hyperparathyroidism, 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   Osteoporosis without current pathological fracture 03/30/2017   Cardiac murmur 04/07/2015   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 stage 4 chronic kidney disease, or unspecified chronic kidney disease 02/10/2015   Anxiety 01/05/2015   Hyperlipidemia 01/05/2015   HTN (hypertension) 09/24/2014    Past Surgical History:  Procedure Laterality Date   COLONOSCOPY  2016   ESOPHAGUS  SURGERY     INTRAMEDULLARY (IM) NAIL INTERTROCHANTERIC Left 03/26/2017   Procedure: INTRAMEDULLARY (IM) NAIL INTERTROCHANTRIC;  Surgeon: Hessie Knows, MD;  Location: [facility];  Service: Orthopedics;  Laterality: Left;   LEFT HEART CATH AND CORONARY ANGIOGRAPHY N/A 04/15/2021   Procedure: LEFT HEART CATH AND CORONARY ANGIOGRAPHY;  Surgeon: Wellington Hampshire, MD;  Location: [place] [facility];  Service: Cardiovascular;  Laterality: N/A;   RIGHT/LEFT HEART CATH AND CORONARY ANGIOGRAPHY Bilateral 05/15/2022   Procedure: RIGHT/LEFT HEART CATH AND CORONARY ANGIOGRAPHY;  Surgeon: Wellington Hampshire, MD;  Location: [place] [facility];  Service: Cardiovascular;  Laterality: Bilateral;   VIDEO BRONCHOSCOPY WITH ENDOBRONCHIAL NAVIGATION Right 06/28/2020   Procedure: VIDEO BRONCHOSCOPY WITH ENDOBRONCHIAL NAVIGATION CELLVIZIO;  Surgeon: Tyler Pita, MD;  Location: [facility];  Service: Pulmonary;  Laterality: Right;    Family History  Problem Relation Age of Onset   Other Mother        [place] posioning   Kidney disease Mother    Heart attack Father    Prostate cancer Father    Cancer Father    Heart disease Father    Heart attack Brother    Diabetes Brother    Hypertension Daughter    Kidney cancer Neg Hx    Bladder Cancer Neg Hx    Colon cancer Neg Hx    Colon polyps Neg Hx    Rectal cancer Neg Hx     Social History   Tobacco Use   Smoking status: Former    Packs/day: 1.00    Years: 61.00    Total pack years: 61.00    Types: Cigarettes    Quit date: 03/25/2017    Years since quitting: 5.3   Smokeless tobacco: Never  Vaping Use   Vaping Use: Never used  Substance Use Topics   Alcohol use: No   Drug use: No     Allergies  Allergen Reactions   Zithromax [Azithromycin]    Cefuroxime Axetil Rash   Ciprofloxacin Nausea Only    Unknown   Codeine Other (See Comments)    Patient does not like how the medication makes you feel     Health Maintenance  Topic Date Due    COVID-19 Vaccine (1) Never done   Zoster Vaccines- Shingrix (1 of 2) Never done   DEXA SCAN  10/31/2013   Medicare Annual Wellness (AWV)  02/08/2022   DTaP/Tdap/Td (2 - Td or Tdap) 05/30/2027   Pneumonia Vaccine 7+ Years old  Completed   INFLUENZA VACCINE  Completed   HPV VACCINES  Aged Out    Chart Review Today: I personally reviewed active problem list, medication list, allergies, family history, social history, health maintenance, notes from last encounter, lab results, imaging with the patient/caregiver today.   Review of Systems  Constitutional: Negative.   HENT: Negative.    Eyes: Negative.   Respiratory: Negative.    Cardiovascular: Negative.   Gastrointestinal: Negative.   Endocrine: Negative.   Genitourinary: Negative.   Musculoskeletal: Negative.   Skin: Negative.   Allergic/Immunologic: Negative.   Neurological: Negative.   Hematological: Negative.   Psychiatric/Behavioral: Negative.    All other systems reviewed and are negative.    Objective:   Vitals:   08/01/22 1356  BP: 136/80  Pulse: 100  Resp: 16  Temp: 97.8 F (36.6 C)  TempSrc: Oral  SpO2: 97%  Weight: 138 lb 14.4 oz (63 kg)  Height: '5\' 7"'$  (1.702 m)    Body mass index is 21.75 kg/m.  Physical Exam Vitals and nursing note reviewed.  Constitutional:      General: She is not in acute distress.    Appearance: Normal appearance. She is well-developed. She is not ill-appearing, toxic-appearing or diaphoretic.     Interventions: Face mask in place.  HENT:     Head: Normocephalic and atraumatic.     Right Ear: External ear normal.     Left Ear: External ear normal.     Nose: Nose normal.  Eyes:     General: Lids are normal. No scleral icterus.       Right eye: No discharge.        Left eye: No discharge.     Conjunctiva/sclera: Conjunctivae normal.  Neck:     Trachea: Phonation normal. No tracheal deviation.  Cardiovascular:     Rate and Rhythm: Normal rate and regular rhythm.      Pulses: Normal pulses.          Radial pulses are 2+ on the right side and 2+ on the left side.       Posterior tibial pulses are 2+ on the right side and 2+ on the left side.     Heart sounds: Normal heart sounds. No murmur heard.    No friction rub. No gallop.  Pulmonary:     Effort: Pulmonary effort is normal. No respiratory distress.     Breath sounds: Normal breath sounds. No stridor. No wheezing, rhonchi or rales.  Chest:     Chest wall: No tenderness.  Abdominal:     General: Bowel sounds are normal. There is no distension.     Palpations: Abdomen is soft.  Musculoskeletal:        General: Normal range of motion.     Right lower leg: No edema.     Left lower leg: No edema.  Skin:    General: Skin is warm and dry.     Coloration: Skin is not jaundiced or pale.     Findings: No rash.  Neurological:     Mental Status: She is alert.     Motor: No abnormal muscle tone.     Coordination: Coordination normal.     Gait: Gait normal.  Psychiatric:        Mood and Affect: Mood normal.        Speech: Speech normal.        Behavior: Behavior normal.         Assessment & Plan:   Problem List Items Addressed This Visit       Cardiovascular  and Mediastinum   HTN (hypertension)    Managed with imdur per cardiology and nephrology BP near goal today BP Readings from Last 3 Encounters:  08/01/22 136/80  05/26/22 95/69  05/15/22 129/78          Respiratory   COPD (chronic obstructive pulmonary disease) ([place])    Per pulmonary - they need to clarify last decision about maintenance inhaler choice/coverage, can use albuterol PRN        Endocrine   Hyperparathyroidism, secondary renal [facility])    Per nephrology        Genitourinary   CKD (chronic kidney disease) stage 4, GFR 15-29 ml/min ([place])    Per nephrology Reviewed keeping BP well controlled, drinking enough fluids to avoid dehydration, staying on top of nephrology f/up         Other   Anxiety    Mood and sx  controlled with remaining on paxil    08/01/2022    1:55 PM 11/08/2021   11:37 AM 11/07/2021   11:33 AM  Depression screen PHQ 2/9  Decreased Interest 0 2 1  Down, Depressed, Hopeless 0 3 1  PHQ - 2 Score 0 5 2  Altered sleeping 0 0 0  Tired, decreased energy 0 2 1  Change in appetite 0 1 1  Feeling bad or failure about yourself  0 0 0  Trouble concentrating 0 3 0  Moving slowly or fidgety/restless 0 0 0  Suicidal thoughts 0 0 0  PHQ-9 Score 0 11 4  Difficult doing work/chores Not difficult at all Somewhat difficult Somewhat difficult        Hyperlipidemia    On lipitor 40, tolerating this with SE Per cardiology Last lipids were 2022       Relevant Orders   Hepatic function panel (Completed)   Lipid panel (Completed)   Prediabetes    Recheck labs      Relevant Orders   Hemoglobin A1c (Completed)   Mild episode of recurrent major depressive disorder ([place]) - Primary    Sx well controlled on paxil, no dose change      Other Visit Diagnoses     Postmenopausal estrogen deficiency       pt higher risk for falls, reviewed the benefit of dexa screening to assess risk and tx if high risk for fx   Relevant Orders   DG Bone Density   Dysphagia, unspecified type       she reports feeling obstructed often with liquid or solids   Relevant Orders   Ambulatory referral to Gastroenterology   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

## 2022-08-02 LAB — HEPATIC FUNCTION PANEL
AG Ratio: 1.4 (calc) (ref 1.0–2.5)
ALT: 10 U/L (ref 6–29)
AST: 14 U/L (ref 10–35)
Albumin: 4.1 g/dL (ref 3.6–5.1)
Alkaline phosphatase (APISO): 101 U/L (ref 37–153)
Bilirubin, Direct: 0.1 mg/dL (ref 0.0–0.2)
Globulin: 3 g/dL (calc) (ref 1.9–3.7)
Indirect Bilirubin: 0.3 mg/dL (calc) (ref 0.2–1.2)
Total Bilirubin: 0.4 mg/dL (ref 0.2–1.2)
Total Protein: 7.1 g/dL (ref 6.1–8.1)

## 2022-08-02 LAB — LIPID PANEL
Cholesterol: 116 mg/dL (ref <200)
HDL: 48 mg/dL — ABNORMAL LOW (ref >=50)
LDL Cholesterol (Calc): 45 mg/dL (calc)
Non-HDL Cholesterol (Calc): 68 mg/dL (calc) (ref <130)
Total CHOL/HDL Ratio: 2.4 (calc) (ref <5.0)
Triglycerides: 144 mg/dL (ref <150)

## 2022-08-02 LAB — HEMOGLOBIN A1C
Hgb A1c MFr Bld: 5.8 % of total Hgb — ABNORMAL HIGH (ref <5.7)
Mean Plasma Glucose: 120 mg/dL
eAG (mmol/L): 6.6 mmol/L

## 2022-08-09 ENCOUNTER — Other Ambulatory Visit: Payer: Self-pay | Admitting: Family Medicine

## 2022-08-09 DIAGNOSIS — J441 Chronic obstructive pulmonary disease with (acute) exacerbation: Secondary | ICD-10-CM

## 2022-08-09 MED ORDER — ALBUTEROL SULFATE HFA 108 (90 BASE) MCG/ACT IN AERS: 2.0000 | INHALATION_SPRAY | Freq: Four times a day (QID) | RESPIRATORY_TRACT | 0 refills | Status: DC | PRN

## 2022-08-09 NOTE — Telephone Encounter (Signed)
Requested medication (s) are due for refill today - expired Rx  Requested medication (s) are on the active medication list -yes  Future visit scheduled -no  Last refill: 03/10/21 8g  Notes to clinic: expired Rx  Requested Prescriptions  Pending Prescriptions Disp Refills   albuterol (VENTOLIN HFA) 108 (90 Base) MCG/ACT inhaler 8 g 0    Sig: Inhale 2 puffs into the lungs every 6 (six) hours as needed for wheezing or shortness of breath.     Pulmonology:  [facility] 2 Passed - 08/09/2022 11:26 AM      Passed - Last BP in normal range    BP Readings from Last 1 Encounters:  08/01/22 136/80         Passed - Last Heart Rate in normal range    Pulse Readings from Last 1 Encounters:  08/01/22 100         Passed - Valid encounter within last 12 months    Recent Outpatient Visits           1 week ago Mild episode of recurrent major depressive disorder [facility])   [facility] [place], Kristeen Miss, PA-C   9 months ago Rash and nonspecific skin eruption   [facility] Delsa Grana, PA-C   9 months ago No-show for appointment   [facility] Delsa Grana, PA-C   1 year ago Back pain without sciatica   [facility] Serafina Royals F, FNP   1 year ago Oral thrush   [facility] Teodora Medici, DO                 Requested Prescriptions  Pending Prescriptions Disp Refills   albuterol (VENTOLIN HFA) 108 (90 Base) MCG/ACT inhaler 8 g 0    Sig: Inhale 2 puffs into the lungs every 6 (six) hours as needed for wheezing or shortness of breath.     Pulmonology:  [facility] 2 Passed - 08/09/2022 11:26 AM      Passed - Last BP in normal range    BP Readings from Last 1 Encounters:  08/01/22 136/80         Passed - Last Heart Rate in normal range    Pulse Readings from Last 1 Encounters:  08/01/22 100         Passed - Valid encounter within last 12 months     Recent Outpatient Visits           1 week ago Mild episode of recurrent major depressive disorder [facility])   [facility] Delsa Grana, PA-C   9 months ago Rash and nonspecific skin eruption   [facility] Delsa Grana, PA-C   9 months ago No-show for appointment   [facility] Delsa Grana, PA-C   1 year ago Back pain without sciatica   [facility] Bo Merino, FNP   1 year ago Oral thrush   [facility] Teodora Medici, [place]

## 2022-08-09 NOTE — Telephone Encounter (Signed)
Medication Refill - Medication: albuterol (VENTOLIN HFA) 108 (90 Base) MCG/ACT inhaler   Has the patient contacted their pharmacy? Yes.     Preferred Pharmacy (with phone number or street name):  [place], [place] Phone: [phone]  Fax: [phone]     Has the patient been seen for an appointment in the last year OR does the patient have an upcoming appointment? Yes.    Please assist patient further

## 2022-08-10 ENCOUNTER — Other Ambulatory Visit: Payer: Self-pay | Admitting: Family Medicine

## 2022-08-10 DIAGNOSIS — J441 Chronic obstructive pulmonary disease with (acute) exacerbation: Secondary | ICD-10-CM

## 2022-08-10 NOTE — Telephone Encounter (Signed)
Rx shows as print. Pt requests that the Rx for albuterol (VENTOLIN HFA) 108 (90 Base) MCG/ACT inhaler  be sent to  [place], [place] Phone: [phone]  Fax: [phone]

## 2022-08-10 NOTE — Telephone Encounter (Signed)
Requested medication (s) are due for refill today:   Rx is expired  Requested medication (s) are on the active medication list:   Yes   Future visit scheduled:   No   Last ordered: 03/10/2021 8 g, 0 refills  On 08/09/2022 a new rx was written for 8 g, 0 refills however it was written as print so wasn't transmitted.  Pt wants it to be sent to [place] in [place].    New rx needed.   Requested Prescriptions  Pending Prescriptions Disp Refills   albuterol (VENTOLIN HFA) 108 (90 Base) MCG/ACT inhaler 8 g 0    Sig: Inhale 2 puffs into the lungs every 6 (six) hours as needed for wheezing or shortness of breath.     Pulmonology:  [facility] 2 Passed - 08/10/2022  3:29 PM      Passed - Last BP in normal range    BP Readings from Last 1 Encounters:  08/01/22 136/80         Passed - Last Heart Rate in normal range    Pulse Readings from Last 1 Encounters:  08/01/22 100         Passed - Valid encounter within last 12 months    Recent Outpatient Visits           1 week ago Mild episode of recurrent major depressive disorder [facility])   [facility] Delsa Grana, PA-C   9 months ago Rash and nonspecific skin eruption   [facility] Delsa Grana, PA-C   9 months ago No-show for appointment   [facility] Delsa Grana, PA-C   1 year ago Back pain without sciatica   [facility] Bo Merino, FNP   1 year ago Oral thrush   [facility] Teodora Medici, [place]

## 2022-08-14 ENCOUNTER — Encounter: Payer: Self-pay | Admitting: Family Medicine

## 2022-08-14 DIAGNOSIS — F330 Major depressive disorder, recurrent, mild: Secondary | ICD-10-CM | POA: Insufficient documentation

## 2022-08-14 NOTE — Assessment & Plan Note (Signed)
Managed with imdur per cardiology and nephrology BP near goal today BP Readings from Last 3 Encounters:  08/01/22 136/80  05/26/22 95/69  05/15/22 129/78

## 2022-08-14 NOTE — Assessment & Plan Note (Signed)
Recheck labs 

## 2022-08-14 NOTE — Assessment & Plan Note (Signed)
Per pulmonary - they need to clarify last decision about maintenance inhaler choice/coverage, can use albuterol PRN

## 2022-08-14 NOTE — Assessment & Plan Note (Signed)
Per nephrology Reviewed keeping BP well controlled, drinking enough fluids to avoid dehydration, staying on top of nephrology f/up

## 2022-08-14 NOTE — Telephone Encounter (Signed)
Pt is calling back checking on the status of her medication. Per pt she is out of her medication and is having trouble breathing and is wanting to see if her medication can be called into her pharmacy.  Please advise

## 2022-08-14 NOTE — Assessment & Plan Note (Signed)
On lipitor 40, tolerating this with SE Per cardiology Last lipids were 2022

## 2022-08-14 NOTE — Assessment & Plan Note (Signed)
Mood and sx controlled with remaining on paxil    08/01/2022    1:55 PM 11/08/2021   11:37 AM 11/07/2021   11:33 AM  Depression screen PHQ 2/9  Decreased Interest 0 2 1  Down, Depressed, Hopeless 0 3 1  PHQ - 2 Score 0 5 2  Altered sleeping 0 0 0  Tired, decreased energy 0 2 1  Change in appetite 0 1 1  Feeling bad or failure about yourself  0 0 0  Trouble concentrating 0 3 0  Moving slowly or fidgety/restless 0 0 0  Suicidal thoughts 0 0 0  PHQ-9 Score 0 11 4  Difficult doing work/chores Not difficult at all Somewhat difficult Somewhat difficult

## 2022-08-14 NOTE — Assessment & Plan Note (Signed)
Per nephrology 

## 2022-08-14 NOTE — Assessment & Plan Note (Signed)
Sx well controlled on paxil, no dose change

## 2022-08-15 NOTE — Telephone Encounter (Signed)
Too soon to fill

## 2022-08-15 NOTE — Telephone Encounter (Signed)
Nashay from the Pharmacy is calling to check on the status for the prescription refill for pt. Advised it takes 48-72 hrs for refill request.  Please advise.

## 2022-08-24 ENCOUNTER — Other Ambulatory Visit: Payer: Self-pay

## 2022-08-24 DIAGNOSIS — J441 Chronic obstructive pulmonary disease with (acute) exacerbation: Secondary | ICD-10-CM

## 2022-08-24 MED ORDER — ALBUTEROL SULFATE HFA 108 (90 BASE) MCG/ACT IN AERS: 2.0000 | INHALATION_SPRAY | Freq: Four times a day (QID) | RESPIRATORY_TRACT | 0 refills | Status: DC | PRN

## 2022-08-24 NOTE — Telephone Encounter (Signed)
Previously it was printed, not sent to [facility]. Will send it to [facility] as pt came in and asked.

## 2022-09-22 ENCOUNTER — Other Ambulatory Visit: Payer: Self-pay | Admitting: Family Medicine

## 2022-09-22 DIAGNOSIS — J441 Chronic obstructive pulmonary disease with (acute) exacerbation: Secondary | ICD-10-CM

## 2022-09-25 ENCOUNTER — Encounter: Payer: Self-pay | Admitting: Family Medicine

## 2022-09-25 ENCOUNTER — Ambulatory Visit (INDEPENDENT_AMBULATORY_CARE_PROVIDER_SITE_OTHER): Payer: Medicare Other | Admitting: Family Medicine

## 2022-09-25 VITALS — BP 136/82 | HR 104 | Temp 97.6°F | Resp 18 | Ht 67.0 in | Wt 137.3 lb

## 2022-09-25 DIAGNOSIS — W5503XA Scratched by cat, initial encounter: Secondary | ICD-10-CM | POA: Diagnosis not present

## 2022-09-25 DIAGNOSIS — R590 Localized enlarged lymph nodes: Secondary | ICD-10-CM | POA: Diagnosis not present

## 2022-09-25 DIAGNOSIS — J449 Chronic obstructive pulmonary disease, unspecified: Secondary | ICD-10-CM | POA: Diagnosis not present

## 2022-09-25 DIAGNOSIS — S50811A Abrasion of right forearm, initial encounter: Secondary | ICD-10-CM | POA: Diagnosis not present

## 2022-09-25 DIAGNOSIS — C3491 Malignant neoplasm of unspecified part of right bronchus or lung: Secondary | ICD-10-CM

## 2022-09-25 MED ORDER — FLUTICASONE FUROATE-VILANTEROL 100-25 MCG/ACT IN AEPB: 1.0000 | INHALATION_SPRAY | Freq: Every day | RESPIRATORY_TRACT | 11 refills | Status: DC

## 2022-09-25 MED ORDER — AZITHROMYCIN 250 MG PO TABS: ORAL_TABLET | ORAL | 0 refills | Status: DC

## 2022-09-25 NOTE — Progress Notes (Unsigned)
Patient ID: Christine Burnett, female    DOB: 06-15-1940, 82 y.o.   MRN: [id number]  PCP: Danelle Berry, PA-C  Chief Complaint  Patient presents with   Edema    Bilateral armpits left- month ago and right-several days it hurts    Subjective:   Christine Burnett is a 82 y.o. female, presents to clinic with CC of the following:  HPI  Patient presents with concerns of axillary and upper arm tenderness swelling and lymphadenopathy.  She reports that on the left side for several weeks that she started to resolve and with a recent scratch and infection on her right forearm she has tenderness in her right upper arm and right armpit  Her scratches from her cat on her forearm and it was very red and swollen she treated it with repeated soaks and the size of swelling redness has improved but is not completely gone, she did not get medical attention or take any antibiotics recently  She denies any fever, sweats, chills, worsening respiratory symptoms does have a history of COPD, she is not taking her maintenance inhalers but is using her rescue inhaler, she has a history of lung cancer to her right lower lobe She did radiation therapy, follows with oncology and pulmonology- 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   Patient Active Problem List   Diagnosis Date Noted   Mild episode of recurrent major depressive disorder 08/14/2022   Dyspnea on exertion 05/15/2022   Coronary artery disease    COPD (chronic obstructive pulmonary disease) 01/28/2021   CKD (chronic kidney disease) stage 4, GFR 15-29 ml/min 01/21/2021   Goals of care, counseling/discussion 07/17/2020   Malignant neoplasm of lower lobe of right lung 07/04/2020   Nocturnal hypoxia 03/17/2020   Prediabetes 08/13/2018   Allergic rhinitis 09/27/2017   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   Stage 3b chronic kidney disease (HCC) 08/31/2017   Hyperparathyroidism, 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 Swallow whole., Disp: , Rfl:    atorvastatin (LIPITOR) 40 MG tablet, Take 1 tablet (40 mg total) by mouth at bedtime., Disp: 90 tablet, Rfl: 2   Calcium 200 MG TABS, Take 300 mg by mouth every Monday, Tuesday, Wednesday, Thursday, and Friday., Disp: , Rfl:    Cholecalciferol (VITAMIN D3) 50 MCG (2000 UT) TABS, Take 2,000 Units by mouth at bedtime., Disp: , Rfl:    isosorbide mononitrate (IMDUR) 30 MG 24 hr tablet, Take 1/2 (one-half) tablet by mouth once daily, Disp: 45 tablet, Rfl: 1   melatonin 3 MG TABS tablet, Take 3 mg by mouth at bedtime., Disp: , Rfl:    Multiple Vitamin (MULTIVITAMIN) capsule, Take 1 capsule by mouth daily., Disp: , Rfl:    PARoxetine (PAXIL) 40 MG tablet, TAKE 1 TABLET BY MOUTH ONCE DAILY IN THE MORNING, Disp: 90 tablet, Rfl: 0   VENTOLIN HFA 108 (90 Base) MCG/ACT inhaler, INHALE 2 PUFFS BY MOUTH EVERY 6 HOURS  AS NEEDED FOR WHEEZING OR SHORTNESS OF BREATH, Disp: 8 g, Rfl: 0   Allergies  Allergen Reactions   Zithromax [Azithromycin]    Cefuroxime Axetil Rash   Ciprofloxacin Nausea Only    Unknown   Codeine Other (See Comments)    Patient does not like how the medication makes you feel      Social History   Tobacco Use   Smoking status: Former    Packs/day: 1.00    Years: 61.00    Additional pack years: 0.00    Total pack years: 61.00    Types: Cigarettes    Quit date: 03/25/2017    Years since quitting: 5.5   Smokeless tobacco:  Never  Vaping Use   Vaping Use: Never used  Substance Use Topics   Alcohol use: No   Drug use: No      Chart Review Today: I personally reviewed active problem list, medication list, allergies, family history, social history, health maintenance, notes from last encounter, lab results, imaging with the patient/caregiver today.   Review of Systems  Constitutional: Negative.   HENT: Negative.    Eyes: Negative.   Respiratory: Negative.    Cardiovascular: Negative.   Gastrointestinal: Negative.   Endocrine: Negative.   Genitourinary: Negative.   Musculoskeletal: Negative.   Skin: Negative.   Allergic/Immunologic: Negative.   Neurological: Negative.   Hematological: Negative.   Psychiatric/Behavioral: Negative.    All other systems reviewed and are negative.      Objective:   Vitals:   09/25/22 1322 09/25/22 1331  BP: 136/82   Pulse: (!) 111 (!) 104  Resp: 18   Temp: 97.6 F (36.4 C)   TempSrc: Oral   SpO2: 93%   Weight: 137 lb 4.8 oz (62.3 kg)   Height:  (1.702 m)     Body mass index is 21.5 kg/m.  Physical Exam Vitals and nursing note reviewed. Exam conducted with a chaperone present.  Constitutional:      General: She is not in acute distress.    Appearance: She is well-developed. She is not toxic-appearing or diaphoretic.  HENT:     Head: Normocephalic and atraumatic.     Nose: Nose normal.  Eyes:     General:        Right eye: No discharge.        Left eye: No discharge.     Conjunctiva/sclera: Conjunctivae normal.  Neck:     Trachea: No tracheal deviation.  Cardiovascular:     Rate and Rhythm: Normal rate and regular rhythm.     Pulses: Normal pulses.     Heart sounds: Normal heart sounds.  Pulmonary:     Effort: Pulmonary effort is normal. No respiratory distress.     Breath sounds: Normal breath sounds. No stridor.  Chest:     Chest wall: No mass, deformity, swelling, tenderness or crepitus. There is no dullness to percussion.      Comments: Right axilla with generalized ttp w/o palpable discrete lymphadenopathy Upper right proximal arm also mildly tender without any nodules, masses, lymphadenopathy, erythema, induration Musculoskeletal:        General: Normal range of motion.  Lymphadenopathy:     Upper Body:     Right upper body: No supraclavicular, axillary or pectoral adenopathy.     Left upper body: No supraclavicular, axillary or pectoral adenopathy.  Skin:    General: Skin is warm and dry.     Findings: No rash.     Comments: Right mid  forearm with scratch/wound scabbed linear wound with roughly 2 cm surrounding erythema and edema   Neurological:     Mental Status: She is alert.     Motor: No abnormal muscle tone.     Coordination: Coordination normal.  Psychiatric:        Behavior: Behavior normal.      Results for orders placed or performed in visit on 08/01/22  Hepatic function panel  Result Value Ref Range   Total Protein 7.1 6.1 - 8.1 g/dL   Albumin 4.1 3.6 - 5.1 g/dL   Globulin 3.0 1.9 - 3.7 g/dL (calc)   AG Ratio 1.4 1.0 - 2.5 (calc)   Total Bilirubin 0.4 0.2 - 1.2 mg/dL   Bilirubin, Direct 0.1 0.0 - 0.2 mg/dL   Indirect Bilirubin 0.3 0.2 - 1.2 mg/dL (calc)   Alkaline phosphatase (APISO) 101 37 - 153 U/L   AST 14 10 - 35 U/L   ALT 10 6 - 29 U/L  Lipid panel  Result Value Ref Range   Cholesterol 116 <200 mg/dL   HDL 48 (L) > OR = 50 mg/dL   Triglycerides 161 <096 mg/dL   LDL Cholesterol (Calc) 45 mg/dL (calc)   Total CHOL/HDL Ratio 2.4 <5.0 (calc)   Non-HDL Cholesterol (Calc) 68 <045 mg/dL (calc)  Hemoglobin W0J  Result Value Ref Range   Hgb A1c MFr Bld 5.8 (H) <5.7 % of total Hgb   Mean Plasma Glucose 120 mg/dL   eAG (mmol/L) 6.6 mmol/L       Assessment & Plan:     ICD-10-CM   1. Axillary lymphadenopathy  R59.0 azithromycin (ZITHROMAX) 250 MG tablet   tenderness w/o palpable discrete lymphadenopathy, likely reactive with scratch/infection, monitor closely for any respiratory  symptoms    2. Non-small cell cancer of right lung  C34.91    following with pulm and oncology    3. Cat scratch of forearm, right, initial encounter  S50.811A azithromycin (ZITHROMAX) 250 MG tablet   W55.03XA    recent scratch and infection that has improved with "soaks" at home, will cover with zpak, close f/up if not improving    4. Chronic obstructive pulmonary disease, unspecified COPD type  J44.9 fluticasone furoate-vilanterol (BREO ELLIPTA) 100-25 MCG/ACT AEPB   see below     COPD - not following up with pulm - she doesn't seem to understand tx plan with inhalers -she would like to switch back to [facility] -there are several other maintenance inhalers on the chart which I have sent in and pulmonology has switched her to her given samples of.  She states the only 1 that works for her is the albuterol inhaler which she is using too frequently. I explained how maintenance inhalers will not give an immediate effect she should not feel significantly different after doing her maintenance inhalers, but over time she should have less respiratory symptoms and be able to use her albuterol rescue inhaler less frequently.  I did send in Breo per her request and reviewed how to use it and reviewed the dosing.  She states she will start using it regularly and explained to her she can use the albuterol rescue inhaler while the medications are getting in her system and working.  Strongly encouraged her to follow-up with pulmonology    With patient's significant history -encouraged her to follow-up if tenderness or fullness in her armpits does not improve over the next 2 to 4 weeks.  On exam I did not palpate any pathological lymph nodes -she  has not lost any weight or had any worsening respiratory symptoms.  I would have a lower threshold to check her with some kind of imaging or have her follow-up sooner with oncology -most likely her tenderness is a reactive lymphadenopathy Other options for f/up if not  resolving in the next 2-4 weeks - CT chest w/o contrast, mammogram, blood work   Plan and f/up reviewed with pt and her daughter  All questions asked and answered Return for 2-3 week f/up if right armpit not better.    Danelle Berry, PA-C 09/25/22 1:39 PM

## 2022-09-25 NOTE — Patient Instructions (Signed)
Please check with the pharmacy about the inhalers (ventolin and generic breo) And let me know what is covered by insurance and let me know if they need me to prescribe it differently (name brand vs generic)  Please follow up if your swelling and discomfort in the right armpit is not improved over the next 2-4 weeks.  Cat-Scratch Disease, Adult Cat-scratch disease is a bacterial infection that is spread to humans through a bite or scratch from an infected cat. The infection can also be spread by having contact with an infected cat and then touching your eyes or mouth. It is sometimes called cat-scratch fever. The infection does not spread from person to person (is not contagious). What are the causes? This condition is caused by bacteria called Bartonella henselae. These bacteria are carried by fleas. Cats get infected from flea bites or flea droppings. The bacteria may be present in the mouth or on the claws of cats or kittens, especially those that are younger than 5 year old. Cats do not look or act sick when they have this infection. What increases the risk? You are more likely to develop this condition if: You own or interact with a cat. You have a weakened body defense system (immune system). Your immune system may be weakened if: You are pregnant. You have certain conditions like cancer, HIV, or AIDS. You received a donated organ (transplant). What are the signs or symptoms? Common symptoms of this condition include: A bite or scratch that does not heal. A red bump near the wound. The bump may be red, warm, and tender to the touch. Other symptoms may take a few weeks to develop. They may include: Swollen, tender glands (lymph nodes) in the neck or under the arms. Fever. Rash. Joint pain. Headache. Low energy(listlessness). Loss of appetite. How is this diagnosed? This condition is diagnosed based on your symptoms and your history of a scratch or bite from a cat. Your health care  provider will examine your skin and look for swollen lymph nodes. You may have tests, such as: A culture test. This involves testing a sample of fluid or pus from your wound. Blood tests. A lymph node biopsy. This involves removing and testing a tissue sample from a swollen lymph node. How is this treated? Cat-scratch disease usually goes away without treatment in 2-4 months. However, a more severe infection may require antibiotic medicine if: You have other illnesses or problems that weaken the immune system. Your symptoms cause discomfort. If you have a painful, swollen lymph node, it may need to be drained with a needle. This is rare. Follow these instructions at home: Medicines Take over-the-counter and prescription medicines only as told by your health care provider. If you were prescribed an antibiotic medicine, take it as told by your health care provider. Do not stop taking the antibiotic even if you start to feel better. General instructions Return to your normal activities as told by your health care provider. Ask your health care provider what activities are safe for you. Apply warm compresses to the area as told your health care provider. Check your wound or swollen lymph node areas every day for signs of infection. Check for: Redness, swelling, or pain. Fluid or blood. Warmth. Pus or a bad smell. Keep all follow-up visits. This is important. How is this prevented? Avoid playing roughly or taking food away from a cat. Wash your hands well after you have contact with a cat. If you get scratched or bitten, wash  the injured area as soon as possible with warm water and soap. Do not let a cat lick your skin if you have any cuts, sores, or scratches. Do not pick up or play with stray cats. If you have an indoor cat: Do not let your cat outside. Having contact with stray cats may make your cat more likely to have contact with the bacteria. Trim your cat's nails. Check your cat for  fleas. Ask your veterinarian which flea and tick repellent is safe to use for you and your cat. Contact a health care provider if: You have signs of infection in your wound or lymph nodes, such as: Redness, swelling, or pain. Fluid or blood. Warmth. Pus or bad smell. You have a fever. Your symptoms get worse or do not get better. You have pain in your bones. Get help right away if: You develop pain in your abdomen. Your skin rash spreads. You feel dizzy or you faint. You develop inflammation of your eye or vision problems. You develop a stiff neck. These symptoms may represent a serious problem that is an emergency. Do not wait to see if the symptoms will go away. Get medical help right away. Call your local emergency services (911 in the [place] Do not drive yourself to the hospital. Summary Cat-scratch disease, sometimes called cat-scratch fever, is a bacterial infection that is caused by a bite or scratch from an infected cat. The infection can cause a red bump near the wound, swollen lymph nodes, and flu-like symptoms. Bacteria that cause this condition may be present in the mouth or on the claws of cats or kittens, especially those that are younger than 83 year old. If you were prescribed an antibiotic medicine, take it as told by your health care provider. Do not stop taking the antibiotic even if you start to feel better. This information is not intended to replace advice given to you by your health care provider. Make sure you discuss any questions you have with your health care provider. Document Revised: 10/07/2020 Document Reviewed: 10/07/2020 Elsevier Patient Education  2023 [employer].

## 2022-10-02 ENCOUNTER — Other Ambulatory Visit: Payer: Self-pay | Admitting: Family Medicine

## 2022-10-02 DIAGNOSIS — F330 Major depressive disorder, recurrent, mild: Secondary | ICD-10-CM

## 2022-10-02 DIAGNOSIS — F419 Anxiety disorder, unspecified: Secondary | ICD-10-CM

## 2022-10-18 DIAGNOSIS — I10 Essential (primary) hypertension: Secondary | ICD-10-CM | POA: Diagnosis not present

## 2022-10-18 DIAGNOSIS — N1832 Chronic kidney disease, stage 3b: Secondary | ICD-10-CM | POA: Diagnosis not present

## 2022-10-18 DIAGNOSIS — N184 Chronic kidney disease, stage 4 (severe): Secondary | ICD-10-CM | POA: Diagnosis not present

## 2022-10-18 DIAGNOSIS — N2581 Secondary hyperparathyroidism of renal origin: Secondary | ICD-10-CM | POA: Diagnosis not present

## 2022-10-18 DIAGNOSIS — I959 Hypotension, unspecified: Secondary | ICD-10-CM | POA: Diagnosis not present

## 2022-10-24 ENCOUNTER — Encounter: Payer: Self-pay | Admitting: Family Medicine

## 2022-10-25 ENCOUNTER — Other Ambulatory Visit: Payer: Medicare Other

## 2022-10-26 ENCOUNTER — Ambulatory Visit
Admission: RE | Admit: 2022-10-26 | Discharge: 2022-10-26 | Disposition: A | Discharge status: Home / Self Care | Payer: Medicare Other | Source: Ambulatory Visit | Attending: Family Medicine | Admitting: Family Medicine

## 2022-10-26 DIAGNOSIS — Z780 Asymptomatic menopausal state: Secondary | ICD-10-CM

## 2022-10-26 DIAGNOSIS — M810 Age-related osteoporosis without current pathological fracture: Secondary | ICD-10-CM | POA: Diagnosis not present

## 2022-10-31 ENCOUNTER — Encounter: Payer: Self-pay | Admitting: Emergency Medicine

## 2022-10-31 ENCOUNTER — Other Ambulatory Visit: Payer: Self-pay

## 2022-10-31 ENCOUNTER — Emergency Department: Payer: Medicare Other

## 2022-10-31 ENCOUNTER — Observation Stay
Admission: EM | Admit: 2022-10-31 | Discharge: 2022-11-02 | Disposition: A | Discharge status: Home Health | Payer: Medicare Other | Attending: Internal Medicine | Admitting: Internal Medicine

## 2022-10-31 DIAGNOSIS — S32592A Other specified fracture of left pubis, initial encounter for closed fracture: Principal | ICD-10-CM | POA: Insufficient documentation

## 2022-10-31 DIAGNOSIS — I129 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 stage 4 chronic kidney disease, or unspecified chronic kidney disease: Secondary | ICD-10-CM | POA: Insufficient documentation

## 2022-10-31 DIAGNOSIS — I2510 Atherosclerotic heart disease of native coronary artery without angina pectoris: Secondary | ICD-10-CM | POA: Diagnosis not present

## 2022-10-31 DIAGNOSIS — W5501XA Bitten by cat, initial encounter: Secondary | ICD-10-CM | POA: Diagnosis not present

## 2022-10-31 DIAGNOSIS — R102 Pelvic and perineal pain: Secondary | ICD-10-CM | POA: Diagnosis present

## 2022-10-31 DIAGNOSIS — Z85118 Personal history of other malignant neoplasm of bronchus and lung: Secondary | ICD-10-CM | POA: Insufficient documentation

## 2022-10-31 DIAGNOSIS — M5416 Radiculopathy, lumbar region: Secondary | ICD-10-CM | POA: Diagnosis not present

## 2022-10-31 DIAGNOSIS — S32110A Nondisplaced Zone I fracture of sacrum, initial encounter for closed fracture: Secondary | ICD-10-CM

## 2022-10-31 DIAGNOSIS — S32512A Fracture of superior rim of left pubis, initial encounter for closed fracture: Secondary | ICD-10-CM | POA: Diagnosis not present

## 2022-10-31 DIAGNOSIS — Z87891 Personal history of nicotine dependence: Secondary | ICD-10-CM | POA: Diagnosis not present

## 2022-10-31 DIAGNOSIS — Z8673 Personal history of transient ischemic attack (TIA), and cerebral infarction without residual deficits: Secondary | ICD-10-CM | POA: Insufficient documentation

## 2022-10-31 DIAGNOSIS — N1832 Chronic kidney disease, stage 3b: Secondary | ICD-10-CM | POA: Diagnosis present

## 2022-10-31 DIAGNOSIS — N184 Chronic kidney disease, stage 4 (severe): Secondary | ICD-10-CM | POA: Diagnosis not present

## 2022-10-31 DIAGNOSIS — S3210XA Unspecified fracture of sacrum, initial encounter for closed fracture: Secondary | ICD-10-CM | POA: Diagnosis not present

## 2022-10-31 DIAGNOSIS — M545 Low back pain, unspecified: Secondary | ICD-10-CM | POA: Diagnosis not present

## 2022-10-31 DIAGNOSIS — J449 Chronic obstructive pulmonary disease, unspecified: Secondary | ICD-10-CM | POA: Insufficient documentation

## 2022-10-31 DIAGNOSIS — M25552 Pain in left hip: Secondary | ICD-10-CM | POA: Diagnosis not present

## 2022-10-31 DIAGNOSIS — I10 Essential (primary) hypertension: Secondary | ICD-10-CM | POA: Diagnosis present

## 2022-10-31 DIAGNOSIS — W19XXXA Unspecified fall, initial encounter: Secondary | ICD-10-CM | POA: Diagnosis not present

## 2022-10-31 DIAGNOSIS — R531 Weakness: Secondary | ICD-10-CM | POA: Diagnosis not present

## 2022-10-31 DIAGNOSIS — S32502A Unspecified fracture of left pubis, initial encounter for closed fracture: Secondary | ICD-10-CM | POA: Diagnosis not present

## 2022-10-31 DIAGNOSIS — Z7982 Long term (current) use of aspirin: Secondary | ICD-10-CM | POA: Diagnosis not present

## 2022-10-31 MED ORDER — ONDANSETRON 8 MG PO TBDP
8.0000 mg | ORAL_TABLET | Freq: Once | ORAL | Status: AC
  Administered 2022-10-31: 8 mg via ORAL
  Filled 2022-10-31: qty 1

## 2022-10-31 MED ORDER — OXYCODONE-ACETAMINOPHEN 5-325 MG PO TABS
1.0000 | ORAL_TABLET | Freq: Once | ORAL | Status: AC
  Administered 2022-10-31: 1 via ORAL
  Filled 2022-10-31: qty 1

## 2022-10-31 MED ORDER — MORPHINE SULFATE (PF) 4 MG/ML IV SOLN
4.0000 mg | Freq: Once | INTRAVENOUS | Status: AC
  Administered 2022-10-31: 4 mg via INTRAVENOUS
  Filled 2022-10-31: qty 1

## 2022-10-31 NOTE — ED Provider Notes (Signed)
[facility] Provider Note  Patient Contact: 8:03 PM (approximate)   History   Fall   HPI  Christine Burnett is a 82 y.o. female who presents the emergency department complaining of headache, back, pelvis pain.  Patient states that her cat grabbed her ankle, while she was trying to remove the cat off her ankle she fell onto her left hip.  She has had previous surgery to this hip and is concerned for injury to both the hip and hardware.  Did not hit her head or lose consciousness.  She is complaining primarily of lower back pain, hip and pelvic pain on the left side.  No loss of bowel or bladder function, saddle anesthesia or paresthesias.  No medication prior to arrival.  Patient is unable to move or bear weight on this hip at this time.     Physical Exam   Triage Vital Signs: ED Triage Vitals  Enc Vitals Group     BP 10/31/22 1647 132/87     Pulse Rate 10/31/22 1647 (!) 110     Resp 10/31/22 1647 18     Temp 10/31/22 1647 98.4 F (36.9 C)     Temp Source 10/31/22 1647 Oral     SpO2 10/31/22 1647 (!) 89 %     Weight --      Height --      Head Circumference --      Peak Flow --      Pain Score 10/31/22 1646 3     Pain Loc --      Pain Edu? --      Excl. in GC? --     Most recent vital signs: Vitals:   10/31/22 1648 10/31/22 2309  BP:  126/84  Pulse:  99  Resp:  (!) 24  Temp:  98.2 F (36.8 C)  SpO2: 94% 95%     General: Alert and in no acute distress. Head: No acute traumatic findings  Neck: No stridor. No cervical spine tenderness to palpation.  Cardiovascular:  Good peripheral perfusion Respiratory: Normal respiratory effort without tachypnea or retractions. Lungs CTAB. Good air entry to the bases with no decreased or absent breath sounds. Gastrointestinal: Bowel sounds 4 quadrants. Soft and nontender to palpation. No guarding or rigidity. No palpable masses. No distention. No CVA tenderness. Musculoskeletal: Limited range of motion  to the left lower extremity.  Tender extending from the left lateral hip through the anterior pelvic region.  No palpable abnormality.  There is no shortening or rotation of the left lower extremity when compared with right.  There are some slight tenderness in the lower lumbar/SI joint region.  No other tenderness to palpation of the osseous structures of the back, hips or lower extremities.  Pulses sensation intact and equal bilateral lower extremities. Neurologic:  No gross focal neurologic deficits are appreciated.  Skin:   No rash noted Other:   ED Results / Procedures / Treatments   Labs (all labs ordered are listed, but only abnormal results are displayed) Labs Reviewed  BASIC METABOLIC PANEL     EKG     RADIOLOGY  I personally viewed, evaluated, and interpreted these images as part of my medical decision making, as well as reviewing the written report by the radiologist.  ED Provider Interpretation: Initial x-ray reveals findings consistent with pubic rami fracture.  Patient has no evidence of hardware damage, loosening or periprosthetic fracture.  Given the fact the patient was unable to bear weight, ambulate  CT scan was obtained to ensure no underlying fracture not identified on x-ray.  CT scan reveals no injuries to the lumbar spine but does have sacral fracture as well's the aforementioned pubic rami fracture.  CT Lumbar Spine Wo Contrast  Result Date: 10/31/2022 CLINICAL DATA:  Lumbar radiculopathy trauma, fall. Low back pain and left hip pain. EXAM: CT LUMBAR SPINE WITHOUT CONTRAST TECHNIQUE: Multidetector CT imaging of the lumbar spine was performed without intravenous contrast administration. Multiplanar CT image reconstructions were also generated. RADIATION DOSE REDUCTION: This exam was performed according to the departmental dose-optimization program which includes automated exposure control, adjustment of the mA and/or kV according to patient size and/or use of  iterative reconstruction technique. COMPARISON:  CT examination dated September 24, 2021 FINDINGS: Segmentation:  5 non rib-bearing lumbar type vertebral bodies are present. The lowest fully formed vertebral body is L5. Alignment: Normal. Vertebrae: Superior endplate deformity of elbow is unchanged. No acute fracture or subluxation. Paraspinal and other soft tissues: Aneurysmal dilatation of the infrarenal abdominal aorta measuring up to 3.2 cm. Disc levels: T12-L1: No significant disc bulge, spinal canal or neural foraminal stenosis. Minimal protrusion of the superior endplate of L1 into the spinal canal. Mild facet joint arthropathy. L1-L2: No significant disc bulge, spinal canal or neural foraminal stenosis. L2-L3: Mild circumferential disc bulge. Facet joint arthropathy. Mild bilateral lateral recess stenosis. No significant neural foraminal stenosis. L3-L4: Disc height loss and disc osteophyte complex. Mild bilateral facet joint arthropathy. Mild lateral recess stenosis bilaterally. No significant neural foraminal stenosis. L4-L5: Disc height loss and disc bulge with moderate bilateral lateral recess stenosis. Mild-to-moderate bilateral facet joint arthropathy. Mild left neural foraminal stenosis. L5-S1: Disc height loss and disc osteophyte complex. Moderate bilateral facet joint arthropathy. Mild bilateral neural foraminal stenosis, left worse than the right. IMPRESSION: 1. No acute fracture or subluxation. 2. Multilevel degenerative disc and joint disease as described above. 3. 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 Recommend follow-up every 3 years. Electronically Signed   By: Larose Hires D.O.   On: 10/31/2022 21:13   CT PELVIS WO CONTRAST  Result Date: 10/31/2022 CLINICAL DATA:  Recent fall with left hip pain, initial encounter EXAM: CT PELVIS WITHOUT CONTRAST TECHNIQUE: Multidetector CT imaging of the pelvis was performed following the standard protocol without intravenous contrast.  RADIATION DOSE REDUCTION: This exam was performed according to the departmental dose-optimization program which includes automated exposure control, adjustment of the mA and/or kV according to patient size and/or use of iterative reconstruction technique. COMPARISON:  Plain film from earlier in the same day, PET-CT from 01/31/2022. FINDINGS: Urinary Tract:  Bladder is partially distended. Bowel: Fecal material is noted scattered throughout the colon. Diverticular changes noted without evidence of diverticulitis. The appendix is within normal limits. Visualized small bowel is unremarkable. Vascular/Lymphatic: Diffuse atherosclerotic calcifications of the abdominal aorta are noted. Infrarenal aorta shows aneurysmal dilatation up to 3.3 cm. This is stable in appearance from the prior PET-CT. No adenopathy is noted. Reproductive:  No mass or other significant abnormality Other:  None. Musculoskeletal: Postsurgical changes are noted in the proximal left femur. Undisplaced left inferior pubic ramus fracture is seen. Superior pubic ramus fracture is intact. No fracture in the proximal left femur is seen. An undisplaced fracture is noted in the sacrum on the left at the S1 level. Degenerative changes of the pubic symphysis are noted. IMPRESSION: Undisplaced fracture of the left inferior pubic ramus. An associated left sacral fracture is noted as well. Postsurgical changes are seen  in the proximal left femur. No acute abnormality is noted. Abdominal aortic aneurysm measuring 3.3 cm infrarenal. Recommend follow-up every 3 years. Reference: J Am Coll Radiol 2013;10:789-794. Electronically Signed   By: Alcide Clever M.D.   On: 10/31/2022 20:43   DG HIP UNILAT WITH PELVIS 2-3 VIEWS LEFT  Result Date: 10/31/2022 CLINICAL DATA:  Left hip pain. EXAM: DG HIP (WITH OR WITHOUT PELVIS) 2-3V LEFT COMPARISON:  March 26, 2017 FINDINGS: There is acute, nondisplaced fracture of the left inferior pubic ramus. Chronic fracture  deformities are seen involving the symphysis pubis and right inferior pubic ramus. A radiopaque intramedullary rod and compression screw device are seen within the proximal left femur. There is moderate to marked severity degenerative changes involving both hips, in the form of joint space narrowing, acetabular sclerosis and lateral acetabular bony spurring. Mild soft tissue swelling is seen along the lateral aspect of the proximal left femoral shaft. IMPRESSION: 1. Nondisplaced fracture of the left inferior pubic ramus. CT correlation is recommended. 2. Chronic fracture deformities of the symphysis pubis and bilateral inferior pubic rami. 3. Moderate to marked severity degenerative changes of the bilateral hips. Electronically Signed   By: Aram Candela M.D.   On: 10/31/2022 17:58    PROCEDURES:  Critical Care performed: No  Procedures   MEDICATIONS ORDERED IN ED: Medications  morphine (PF) 4 MG/ML injection 4 mg (4 mg Intravenous Given 10/31/22 2014)  ondansetron (ZOFRAN-ODT) disintegrating tablet 8 mg (8 mg Oral Given 10/31/22 2015)  oxyCODONE-acetaminophen (PERCOCET/ROXICET) 5-325 MG per tablet 1 tablet (1 tablet Oral Given 10/31/22 2153)     IMPRESSION / MDM / ASSESSMENT AND PLAN / ED COURSE  I reviewed the triage vital signs and the nursing notes.                                 Differential diagnosis includes, but is not limited to, hip fracture, pelvic fracture, sacral fracture, lumbar spine compression fracture   Patient's presentation is most consistent with acute presentation with potential threat to life or bodily function.   Patient's diagnosis is consistent with fall, pubic rami fracture, sacral fracture.  Patient presents the emergency department after mechanical fall.  Cat jumped on her leg and when she was tried to shake it off she lost balance and fell.  Patient landed on the left side.  Did not hit her head or lose consciousness.  Patient had pain in her left hip and  pelvic region.  X-ray initially revealed pubic rami fracture but no hip fracture.  Given her inability to bear weight I ordered CT scan which confirms pubic rami fracture and has a nondisplaced sacral fracture as well.  These are nonsurgical in nature but patient is unable to bear weight and take care of herself at home.  This time we will admit for fractures, pain management and short-term rehab placement.  Patient and family are agreeable with this plan.  Hospitalist consulted and agrees to accept the patient at this time.Marland Kitchen     FINAL CLINICAL IMPRESSION(S) / ED DIAGNOSES   Final diagnoses:  Fall, initial encounter  Closed fracture of ramus of left pubis, initial encounter (HCC)  Closed nondisplaced zone I fracture of sacrum, initial encounter (HCC)     Rx / DC Orders   ED Discharge Orders     None        Note:  This document was prepared using Dragon voice recognition software  and may include unintentional dictation errors.   Lanette Hampshire 11/01/22 0007    Concha Se, MD 11/04/22 1435

## 2022-10-31 NOTE — ED Notes (Signed)
Pt was able to stand, but not ambulate.  Pa-c cuthriell aware

## 2022-10-31 NOTE — ED Notes (Signed)
First nurse note:Pt here via [facility] with c/o of falling on L side. Pt was swatting at animals and tripped.      116/93 HR: 101 RR: 16

## 2022-10-31 NOTE — ED Triage Notes (Addendum)
Patient to ED via [facility] from home for a fall. Patient states her cat bit her ankle and she was swatting cat away and fell. Patient c/o of left hip pain. Hx of rod placement in hip in 2018  Patient wears 2L [place] at home PRN. Patient 89% on RA during triage. Patient placed on 2L with O2 at 96%.

## 2022-11-01 ENCOUNTER — Encounter: Payer: Self-pay | Admitting: Internal Medicine

## 2022-11-01 DIAGNOSIS — S3210XA Unspecified fracture of sacrum, initial encounter for closed fracture: Secondary | ICD-10-CM | POA: Diagnosis present

## 2022-11-01 DIAGNOSIS — W19XXXA Unspecified fall, initial encounter: Secondary | ICD-10-CM

## 2022-11-01 DIAGNOSIS — N1832 Chronic kidney disease, stage 3b: Secondary | ICD-10-CM

## 2022-11-01 DIAGNOSIS — S32110A Nondisplaced Zone I fracture of sacrum, initial encounter for closed fracture: Secondary | ICD-10-CM | POA: Diagnosis not present

## 2022-11-01 DIAGNOSIS — S32592A Other specified fracture of left pubis, initial encounter for closed fracture: Secondary | ICD-10-CM | POA: Diagnosis not present

## 2022-11-01 DIAGNOSIS — I10 Essential (primary) hypertension: Secondary | ICD-10-CM

## 2022-11-01 LAB — COMPREHENSIVE METABOLIC PANEL
ALT: 16 U/L (ref 0–44)
AST: 23 U/L (ref 15–41)
Albumin: 3.6 g/dL (ref 3.5–5.0)
Alkaline Phosphatase: 93 U/L (ref 38–126)
Anion gap: 6 (ref 5–15)
BUN: 30 mg/dL — ABNORMAL HIGH (ref 8–23)
CO2: 27 mmol/L (ref 22–32)
Calcium: 9.3 mg/dL (ref 8.9–10.3)
Chloride: 104 mmol/L (ref 98–111)
Creatinine, Ser: 1.73 mg/dL — ABNORMAL HIGH (ref 0.44–1.00)
GFR, Estimated: 29 mL/min — ABNORMAL LOW (ref >60)
Glucose, Bld: 105 mg/dL — ABNORMAL HIGH (ref 70–99)
Potassium: 4.6 mmol/L (ref 3.5–5.1)
Sodium: 137 mmol/L (ref 135–145)
Total Bilirubin: 1 mg/dL (ref 0.3–1.2)
Total Protein: 7.3 g/dL (ref 6.5–8.1)

## 2022-11-01 LAB — CBC
HCT: 36.3 % (ref 36.0–46.0)
Hemoglobin: 12.1 g/dL (ref 12.0–15.0)
MCH: 33.2 pg (ref 26.0–34.0)
MCHC: 33.3 g/dL (ref 30.0–36.0)
MCV: 99.7 fL (ref 80.0–100.0)
Platelets: 189 10*3/uL (ref 150–400)
RBC: 3.64 MIL/uL — ABNORMAL LOW (ref 3.87–5.11)
RDW: 14.1 % (ref 11.5–15.5)
WBC: 7.7 10*3/uL (ref 4.0–10.5)
nRBC: 0 % (ref 0.0–0.2)

## 2022-11-01 MED ORDER — ALBUTEROL SULFATE HFA 108 (90 BASE) MCG/ACT IN AERS: 2.0000 | INHALATION_SPRAY | Freq: Four times a day (QID) | RESPIRATORY_TRACT | Status: DC | PRN

## 2022-11-01 MED ORDER — PAROXETINE HCL 20 MG PO TABS
40.0000 mg | ORAL_TABLET | Freq: Every morning | ORAL | Status: DC
  Administered 2022-11-01 – 2022-11-02 (×2): 40 mg via ORAL
  Filled 2022-11-01 (×2): qty 2

## 2022-11-01 MED ORDER — ATORVASTATIN CALCIUM 20 MG PO TABS
40.0000 mg | ORAL_TABLET | Freq: Every day | ORAL | Status: DC
  Administered 2022-11-01: 40 mg via ORAL
  Filled 2022-11-01: qty 2

## 2022-11-01 MED ORDER — ALBUTEROL SULFATE (2.5 MG/3ML) 0.083% IN NEBU: 2.5000 mg | INHALATION_SOLUTION | Freq: Four times a day (QID) | RESPIRATORY_TRACT | Status: DC | PRN

## 2022-11-01 MED ORDER — HYDROCODONE-ACETAMINOPHEN 5-325 MG PO TABS
1.0000 | ORAL_TABLET | ORAL | Status: DC | PRN
  Administered 2022-11-01 – 2022-11-02 (×4): 1 via ORAL
  Filled 2022-11-01 (×4): qty 1

## 2022-11-01 MED ORDER — HEPARIN SODIUM (PORCINE) 5000 UNIT/ML IJ SOLN
5000.0000 [IU] | Freq: Four times a day (QID) | INTRAMUSCULAR | Status: DC
  Administered 2022-11-01 – 2022-11-02 (×6): 5000 [IU] via SUBCUTANEOUS
  Filled 2022-11-01 (×6): qty 1

## 2022-11-01 MED ORDER — ASPIRIN 81 MG PO TBEC
81.0000 mg | DELAYED_RELEASE_TABLET | Freq: Every day | ORAL | Status: DC
  Administered 2022-11-01 (×2): 81 mg via ORAL
  Filled 2022-11-01 (×2): qty 1

## 2022-11-01 MED ORDER — ACETAMINOPHEN 650 MG RE SUPP: 650.0000 mg | Freq: Four times a day (QID) | RECTAL | Status: DC | PRN

## 2022-11-01 MED ORDER — MORPHINE SULFATE (PF) 2 MG/ML IV SOLN: 2.0000 mg | INTRAVENOUS | Status: DC | PRN

## 2022-11-01 MED ORDER — FLUTICASONE FUROATE-VILANTEROL 100-25 MCG/ACT IN AEPB
1.0000 | INHALATION_SPRAY | Freq: Every day | RESPIRATORY_TRACT | Status: DC
  Administered 2022-11-01 – 2022-11-02 (×2): 1 via RESPIRATORY_TRACT
  Filled 2022-11-01: qty 28

## 2022-11-01 MED ORDER — SODIUM CHLORIDE 0.9 % IV SOLN: INTRAVENOUS | Status: DC

## 2022-11-01 MED ORDER — ACETAMINOPHEN 325 MG PO TABS: 650.0000 mg | ORAL_TABLET | Freq: Four times a day (QID) | ORAL | Status: DC | PRN

## 2022-11-01 MED ORDER — SODIUM CHLORIDE 0.9% FLUSH
3.0000 mL | Freq: Two times a day (BID) | INTRAVENOUS | Status: DC
  Administered 2022-11-01 – 2022-11-02 (×3): 3 mL via INTRAVENOUS

## 2022-11-01 NOTE — Evaluation (Signed)
Physical Therapy Evaluation Patient Details Name: Christine Burnett MRN: [id number] DOB: 02-02-1941 Today's Date: 11/01/2022  History of Present Illness  82 y.o. female with medical history significant for COPD, history of lung cancer, osteoporosis presenting after a fall.  Apparently she was walking to the door and her cat bit/tripped her.  Clinical Impression  Pt pleasant and engaged t/o PT session, showed good effort - minimal pain at rest but increased with all activities with L (and with using L to stabilize while doing R LE tasks).  Pt also c/o mild intermittent R knee pain especially with standing/WBing tasks.  Pt overall did better than she expected but still needed assist with bed mobility, to stand and though she managed ~35 ft of ambulation with plenty of cuing, etc she had very slow, guarded effort.  Pt will benefit from continued PT to address functional limitations and work toward increased independence.       Recommendations for follow up therapy are one component of a multi-disciplinary discharge planning process, led by the attending physician.  Recommendations may be updated based on patient status, additional functional criteria and insurance authorization.  Follow Up Recommendations Can patient physically be transported by private vehicle: Yes     Assistance Recommended at Discharge Frequent or constant Supervision/Assistance  Patient can return home with the following  A little help with walking and/or transfers;A little help with bathing/dressing/bathroom;Assistance with cooking/housework;Assist for transportation;Help with stairs or ramp for entrance    Equipment Recommendations Rolling walker (2 wheels)  Recommendations for Other Services       Functional Status Assessment Patient has had a recent decline in their functional status and demonstrates the ability to make significant improvements in function in a reasonable and predictable amount of time.      Precautions / Restrictions Precautions Precautions: Fall Restrictions Weight Bearing Restrictions: No      Mobility  Bed Mobility Overal bed mobility: Needs Assistance Bed Mobility: Supine to Sit, Sit to Supine     Supine to sit: Min assist Sit to supine: Min assist   General bed mobility comments: Pt showed good effort, only light assist with L LE to get to/from supine.  slow, hesitant and c/o pain with the effort    Transfers Overall transfer level: Needs assistance Equipment used: Rolling walker (2 wheels) Transfers: Sit to/from Stand Sit to Stand: Min assist           General transfer comment: Cues for set up and sequencing, able to initiate upward effort with only light assist to insure forward weight shift and to attain upright posture    Ambulation/Gait Ambulation/Gait assistance: Min assist Gait Distance (Feet): 35 Feet Assistive device: Rolling walker (2 wheels)         General Gait Details: Pt guarded and with hesitation but with cuing for appropriate WBing through UEs/AD she did show increased conficence, still very slow and guarded with the effort.  Pt c/o increased pain with WBing, no LOBs but close CGA t/o the entire session with 2 episodes of light assist during buckling.  Pt quite fatigued with the modest distance and HR up to the 120s.  Stairs            Wheelchair Mobility    Modified Rankin (Stroke Patients Only)       Balance Overall balance assessment: Needs assistance Sitting-balance support: Single extremity supported Sitting balance-Leahy Scale: Good     Standing balance support: Bilateral upper extremity supported Standing balance-Leahy Scale: Fair Standing balance comment:  reliant on walker but able to accept weight through L LE consistently during the effort.                             Pertinent Vitals/Pain Pain Assessment Pain Assessment: 0-10 Pain Score: 5  Pain Location: L pelvis, occasional R knee  pain Pain Intervention(s): Limited activity within patient's tolerance, Premedicated before session    Home Living Family/patient expects to be discharged to:: Private residence Living Arrangements: Children Available Help at Discharge: Family;Available PRN/intermittently (daughter out of home for work t/o the day)   Home Access: Stairs to enter Entrance Stairs-Rails: Right Entrance Stairs-Number of Steps: 3     Home Equipment: Rollator (4 wheels);Cane - single point      Prior Function Prior Level of Function : Independent/Modified Independent             Mobility Comments: report she will go out into community with daughters, does not typrically need an AD but will use SPC occasionally ADLs Comments: reports she is able to dress, use toilet, etc w/o assist     Hand Dominance        Extremity/Trunk Assessment   Upper Extremity Assessment Upper Extremity Assessment: Generalized weakness    Lower Extremity Assessment Lower Extremity Assessment: Generalized weakness (R SLRs, unable to on L - pain in L pelvis with both, able to tolerate light resistance with L AROM)       Communication   Communication: No difficulties  Cognition Arousal/Alertness: Awake/alert Behavior During Therapy: WFL for tasks assessed/performed Overall Cognitive Status: Within Functional Limits for tasks assessed                                          General Comments General comments (skin integrity, edema, etc.): Pt pleasant and eager to see what she can do.  Expected hesitation and weakness with standing/ambulation but did manage to go 35 ft with walker and minA.    Exercises General Exercises - Lower Extremity Ankle Circles/Pumps: AROM, 10 reps Quad Sets: Strengthening, 10 reps Short Arc Quad: Strengthening, 10 reps Heel Slides: AROM, 5 reps (lightly resisted leg ext per tolerance) Hip ABduction/ADduction: AROM, Strengthening, 10 reps Straight Leg Raises: AROM,  AAROM, 5 reps (AROM on R, AAROM on L)   Assessment/Plan    PT Assessment Patient needs continued PT services  PT Problem List Decreased strength;Decreased range of motion;Decreased activity tolerance;Decreased balance;Decreased mobility;Decreased knowledge of use of DME;Decreased safety awareness;Pain;Cardiopulmonary status limiting activity       PT Treatment Interventions DME instruction;Functional mobility training;Therapeutic exercise;Balance training;Gait training;Stair training;Therapeutic activities;Patient/family education    PT Goals (Current goals can be found in the Care Plan section)  Acute Rehab PT Goals Patient Stated Goal: get strong enough to be home alone PT Goal Formulation: With patient Time For Goal Achievement: 11/14/22 Potential to Achieve Goals: Good    Frequency Min 4X/week     Co-evaluation               AM-PAC PT "6 Clicks" Mobility  Outcome Measure Help needed turning from your back to your side while in a flat bed without using bedrails?: A Little Help needed moving from lying on your back to sitting on the side of a flat bed without using bedrails?: A Little Help needed moving to and from a bed to a chair (including  a wheelchair)?: A Little Help needed standing up from a chair using your arms (e.g., wheelchair or bedside chair)?: A Little Help needed to walk in hospital room?: A Lot Help needed climbing 3-5 steps with a railing? : Total 6 Click Score: 15    End of Session Equipment Utilized During Treatment: Gait belt Activity Tolerance: Patient limited by fatigue;Patient limited by pain Patient left: with bed alarm set;with call bell/phone within reach Nurse Communication: Mobility status PT Visit Diagnosis: Muscle weakness (generalized) (M62.81);Difficulty in walking, not elsewhere classified (R26.2);Pain Pain - Right/Left: Left Pain - part of body: Hip    Time: 4540-9811 PT Time Calculation (min) (ACUTE ONLY): 32 min   Charges:    PT Evaluation $PT Eval Low Complexity: 1 Low PT Treatments $Gait Training: 8-22 mins $Therapeutic Exercise: 8-22 mins        Malachi Pro, DPT 11/01/2022, 12:15 PM

## 2022-11-01 NOTE — Progress Notes (Signed)
  Progress Note   Patient: Christine Burnett:[id number] DOB: [birth year] DOA: 10/31/2022     0 DOS: the patient was seen and examined on 11/01/2022   Brief hospital course: RUCHEL COMP is a 82 y.o. female with medical history significant for COPD, history of lung cancer, osteoporosis presenting after a fall today. Imaging shows a fracture of the pubic rami CT shows sacral fracture as well along with her pubic rami fracture. She is admitted for pain control, PT/ OT evaluation.   Assessment and Plan: S/p mechanical fall at home. Sacral and inferior pubic rami fracture- Continue pain control. Discharge held as she does not feel safe to go home and daughter unable to take care of her. PT re-eval for dispo plan.  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 Avoid contrast and renally dose needed medications.   COPD: Stable currently at 2 L . Home O2 Flow Rate (L/min): 2 L/min   CAD: Continue aspirin 81, atorvastatin. EKG no acute changes   Hypertension: states her BP falls with antihypertensives. Hold off oral medications. Monitor for now.     Subjective: Patient is seen and examined today morning. She is feeling better, worked with PT. Planned to discharge her today as pain better with PT recommendations. She had transportation issues and care at her home, discharge canceled.  Physical Exam: Vitals:   11/01/22 0400 11/01/22 0802 11/01/22 0901 11/01/22 1519  BP: 99/81 (!) 102/59  (!) 86/62  Pulse: 94 100  99  Resp: 17 16  16   Temp: 98.2 F (36.8 C) 99 F (37.2 C)  98.3 F (36.8 C)  TempSrc: Oral     SpO2:  93%  94%  Weight:   63 kg   Height:   5\' 7"  (1.702 m)   Heart -S1, s2 heard. Lungs clear  Abdomen - soft, non tender. Neuro - AAO, not cooperative with exam.  Data Reviewed:  There are no new results to review at this time.  Family Communication: patient and daughter would like safe dispo plan. She lives alone and has no help.   Disposition: Status is:  Observation The patient remains OBS appropriate and will d/c before 2 midnights.  Planned Discharge Destination: Home with Home Health    Time spent: 39 minutes  Author: Marcelino Duster, MD 11/01/2022 6:18 PM  For on call review [email or web address].

## 2022-11-01 NOTE — Plan of Care (Signed)

## 2022-11-01 NOTE — H&P (Signed)
History and Physical    Patient: Christine Burnett:[id number] DOB: 1941/02/17 DOA: 10/31/2022 DOS: the patient was seen and examined on 11/01/2022 PCP: Danelle Berry, PA-C  Patient coming from: Home  Chief Complaint:  Chief Complaint  Patient presents with   Fall    HPI: Christine Burnett is a 82 y.o. female with medical history significant for COPD, history of lung cancer, osteoporosis presenting after a fall today. Patient states that he was walking and on her way out the door but her cat beat her to it and her cat also bit her ankle and not that hard but that is how she fell because she lost her balance and tripped. She has a Emergency planning/management officer named Simonne Come.  Initial heart rate was 101 respirations 16 blood pressure stable at 116/93.  No reports of chest pain palpitations dizziness orthostasis. Patient had unfortunately a similar experience previously where she had a hip fracture after tripping over her pet at that time I think she is she said she had a dog. She lives at home with her daughter who is at work during the day but patient is able to do for herself.  She does have a 2 L nasal cannula at home that she wears for COPD as needed. Patient on arrival had pain in her lower back in the pelvic area.  No incontinence of urine or stool numbness tingling paresthesias.  In the emergency room patient was able to stand But was not able to bear weight on her left leg, is when provider suggested she stay to get physical therapy evaluation pain control and possible rehab placement.  In the emergency room patient is alert awake oriented afebrile and cooperative.  Initial imaging shows a fracture of the pubic rami CT shows sacral fracture as well along with her pubic rami fracture. If patient's clinical condition worsens or starts developing any neurological symptoms we will request neurosurgery consult or orthopedic as deemed appropriate. She was given morphine for pain along with Percocet and Zofran for  nausea. No blood work in the emergency room will order along with EKG. Review of Systems: As mentioned in the history of present illness. All other systems reviewed and are negative.  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   Managed by nephrologist   Non-small cell lung cancer (NSCLC) (HCC)    Osteoporosis    Oxygen deficiency Me   Risk for falls    Situational disturbance    Stroke (HCC) 2011   tia's x 2   TIA (transient ischemic attack)    Past Surgical History:  Procedure Laterality Date   COLONOSCOPY  2016   ESOPHAGUS SURGERY     INTRAMEDULLARY (IM) NAIL INTERTROCHANTERIC Left 03/26/2017   Procedure: INTRAMEDULLARY (IM) NAIL INTERTROCHANTRIC;  Surgeon: Kennedy Bucker, MD;  Location: [facility];  Service: Orthopedics;  Laterality: Left;   LEFT HEART CATH AND CORONARY ANGIOGRAPHY N/A 04/15/2021   Procedure: LEFT HEART CATH AND CORONARY  ANGIOGRAPHY;  Surgeon: Iran Ouch, MD;  Location: [facility];  Service: Cardiovascular;  Laterality: N/A;   RIGHT/LEFT HEART CATH AND CORONARY ANGIOGRAPHY Bilateral 05/15/2022   Procedure: RIGHT/LEFT HEART CATH AND CORONARY ANGIOGRAPHY;  Surgeon: Iran Ouch, MD;  Location: [facility];  Service: Cardiovascular;  Laterality: Bilateral;   VIDEO BRONCHOSCOPY WITH ENDOBRONCHIAL NAVIGATION Right 06/28/2020   Procedure: VIDEO BRONCHOSCOPY WITH ENDOBRONCHIAL NAVIGATION CELLVIZIO;  Surgeon: Salena Saner, MD;  Location: [facility];  Service: Pulmonary;   Laterality: Right;   Social History:  reports that she quit smoking about 5 years ago. Her smoking use included cigarettes. She has a 61.00 pack-year smoking history. She has never used smokeless tobacco. She reports that she does not drink alcohol and does not use drugs.  Allergies  Allergen Reactions   Zithromax [Azithromycin]    Cefuroxime Axetil Rash   Ciprofloxacin Nausea Only    Unknown   Codeine Other (See Comments)    Patient does not like how the medication makes you feel     Family History  Problem Relation Age of Onset   Other Mother        Uremeic posioning   Kidney disease Mother    Heart attack Father    Prostate cancer Father    Cancer Father    Heart disease Father    Heart attack Brother    Diabetes Brother    Hypertension Daughter    Kidney cancer Neg Hx    Bladder Cancer Neg Hx    Colon cancer Neg Hx    Colon polyps Neg Hx    Rectal cancer Neg Hx     Prior to Admission medications   Medication Sig Start Date End Date Taking? Authorizing Provider  acetaminophen (TYLENOL) 650 MG CR tablet Take 1 tablet (650 mg total) by mouth 4 (four) times daily as needed for pain. 01/30/20   Danelle Berry, PA-C  aspirin 81 MG EC tablet Take 81 mg by mouth at bedtime. Swallow whole.    [provider]  atorvastatin (LIPITOR) 40 MG tablet Take 1 tablet (40 mg total) by mouth at bedtime. 07/10/22   Debbe Odea, MD  azithromycin (ZITHROMAX) 250 MG tablet Take 2 tabs (500 mg) PO qd x 1, then take 1 tab (250 mg) PO qd for day 2-5 09/25/22   Danelle Berry, PA-C  Calcium 200 MG TABS Take 300 mg by mouth every Monday, Tuesday, Wednesday, Thursday, and Friday.    [provider]  Cholecalciferol (VITAMIN D3) 50 MCG (2000 UT) TABS Take 2,000 Units by mouth at bedtime.    [provider]  fluticasone furoate-vilanterol (BREO ELLIPTA) 100-25 MCG/ACT AEPB Inhale 1 puff into the lungs daily. 09/25/22   Danelle Berry, PA-C  isosorbide mononitrate (IMDUR) 30 MG 24  hr tablet Take 1/2 (one-half) tablet by mouth once daily 07/10/22   Debbe Odea, MD  melatonin 3 MG TABS tablet Take 3 mg by mouth at bedtime.    [provider]  Multiple Vitamin (MULTIVITAMIN) capsule Take 1 capsule by mouth daily.    [provider]  PARoxetine (PAXIL) 40 MG tablet TAKE 1 TABLET BY MOUTH ONCE DAILY IN THE MORNING 10/02/22   Danelle Berry, PA-C  VENTOLIN HFA 108 (90 Base) MCG/ACT inhaler INHALE 2 PUFFS BY MOUTH EVERY 6 HOURS AS NEEDED FOR WHEEZING OR SHORTNESS OF BREATH 09/22/22   Danelle Berry, PA-C    Physical Exam: Vitals:   10/31/22 1647 10/31/22 1648 10/31/22 2309 11/01/22 0148  BP: 132/87  126/84   Pulse: (!) 110  99   Resp: 18  (!) 24   Temp: 98.4 F (36.9 C)  98.2 F (36.8 C) 97.9 F (36.6 C)  TempSrc: Oral  Oral   SpO2: (!) 89% 94% 95%    Physical Exam Vitals reviewed.  Constitutional:      Appearance: Normal appearance. She is not ill-appearing.  HENT:     Head: Normocephalic and atraumatic.     Right Ear: External ear normal.     Left Ear: External ear normal.  Eyes:     Extraocular Movements: Extraocular movements intact.  Cardiovascular:     Rate and Rhythm: Normal rate and regular rhythm.     Pulses: Normal pulses.     Heart sounds: Normal heart sounds.  Pulmonary:     Effort: Pulmonary effort is normal.     Breath sounds: Normal breath sounds.  Abdominal:     General: Bowel sounds are normal.     Palpations: Abdomen is soft.  Musculoskeletal:     Right lower leg: No edema.     Left lower leg: No edema.  Skin:    General: Skin is warm.  Neurological:     General: No focal deficit present.     Mental Status: She is alert and oriented to person, place, and time.  Psychiatric:        Mood and Affect: Mood normal.        Behavior: Behavior normal.     Labs on Admission: I have personally reviewed following labs and imaging studies  Urine analysis:    Component Value Date/Time   COLORURINE YELLOW (A) 09/24/2021  1705   APPEARANCEUR CLEAR (A) 09/24/2021 1705   APPEARANCEUR Clear 01/28/2016 0841   LABSPEC 1.013 09/24/2021 1705   PHURINE 5.0 09/24/2021 1705   GLUCOSEU NEGATIVE 09/24/2021 1705   HGBUR NEGATIVE 09/24/2021 1705   BILIRUBINUR NEGATIVE 09/24/2021 1705   BILIRUBINUR neg 07/29/2019 1541   BILIRUBINUR Negative 01/28/2016 0841   KETONESUR NEGATIVE 09/24/2021 1705   PROTEINUR NEGATIVE 09/24/2021 1705   UROBILINOGEN 0.2 07/29/2019 1541   NITRITE NEGATIVE 09/24/2021 1705   LEUKOCYTESUR NEGATIVE 09/24/2021 1705    Radiological Exams on Admission: CT Lumbar Spine Wo Contrast  Result Date: 10/31/2022 CLINICAL DATA:  Lumbar radiculopathy trauma, fall. Low back pain and left hip pain. EXAM: CT LUMBAR SPINE WITHOUT CONTRAST TECHNIQUE: Multidetector CT imaging of the lumbar spine was performed without intravenous contrast administration. Multiplanar CT image reconstructions were also generated. RADIATION DOSE REDUCTION: This exam was performed according to the departmental dose-optimization program which includes automated exposure control, adjustment of the mA and/or kV according to patient size and/or use of iterative reconstruction technique. COMPARISON:  CT examination dated September 24, 2021 FINDINGS: Segmentation:  5 non rib-bearing lumbar type vertebral bodies are present. The lowest fully formed vertebral body is L5. Alignment: Normal. Vertebrae: Superior endplate deformity of elbow is unchanged. No acute fracture or subluxation. Paraspinal and other soft tissues: Aneurysmal dilatation of the infrarenal abdominal aorta measuring up to 3.2 cm. Disc levels: T12-L1: No significant disc bulge, spinal canal or neural foraminal stenosis. Minimal protrusion of the superior endplate of L1 into the spinal canal. Mild facet joint arthropathy. L1-L2: No significant disc bulge, spinal canal or neural foraminal stenosis. L2-L3: Mild circumferential disc bulge. Facet joint arthropathy. Mild bilateral lateral recess  stenosis. No significant neural foraminal stenosis. L3-L4: Disc height loss and disc osteophyte complex. Mild bilateral facet joint arthropathy. Mild lateral recess stenosis  bilaterally. No significant neural foraminal stenosis. L4-L5: Disc height loss and disc bulge with moderate bilateral lateral recess stenosis. Mild-to-moderate bilateral facet joint arthropathy. Mild left neural foraminal stenosis. L5-S1: Disc height loss and disc osteophyte complex. Moderate bilateral facet joint arthropathy. Mild bilateral neural foraminal stenosis, left worse than the right. IMPRESSION: 1. No acute fracture or subluxation. 2. Multilevel degenerative disc and joint disease as described above. 3. 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 Recommend follow-up every 3 years. Electronically Signed   By: Larose Hires D.O.   On: 10/31/2022 21:13   CT PELVIS WO CONTRAST  Result Date: 10/31/2022 CLINICAL DATA:  Recent fall with left hip pain, initial encounter EXAM: CT PELVIS WITHOUT CONTRAST TECHNIQUE: Multidetector CT imaging of the pelvis was performed following the standard protocol without intravenous contrast. RADIATION DOSE REDUCTION: This exam was performed according to the departmental dose-optimization program which includes automated exposure control, adjustment of the mA and/or kV according to patient size and/or use of iterative reconstruction technique. COMPARISON:  Plain film from earlier in the same day, PET-CT from 01/31/2022. FINDINGS: Urinary Tract:  Bladder is partially distended. Bowel: Fecal material is noted scattered throughout the colon. Diverticular changes noted without evidence of diverticulitis. The appendix is within normal limits. Visualized small bowel is unremarkable. Vascular/Lymphatic: Diffuse atherosclerotic calcifications of the abdominal aorta are noted. Infrarenal aorta shows aneurysmal dilatation up to 3.3 cm. This is stable in appearance from the prior PET-CT. No  adenopathy is noted. Reproductive:  No mass or other significant abnormality Other:  None. Musculoskeletal: Postsurgical changes are noted in the proximal left femur. Undisplaced left inferior pubic ramus fracture is seen. Superior pubic ramus fracture is intact. No fracture in the proximal left femur is seen. An undisplaced fracture is noted in the sacrum on the left at the S1 level. Degenerative changes of the pubic symphysis are noted. IMPRESSION: Undisplaced fracture of the left inferior pubic ramus. An associated left sacral fracture is noted as well. Postsurgical changes are seen in the proximal left femur. No acute abnormality is noted. Abdominal aortic aneurysm measuring 3.3 cm infrarenal. Recommend follow-up every 3 years. Reference: J Am Coll Radiol 2013;10:789-794. Electronically Signed   By: Alcide Clever M.D.   On: 10/31/2022 20:43   DG HIP UNILAT WITH PELVIS 2-3 VIEWS LEFT  Result Date: 10/31/2022 CLINICAL DATA:  Left hip pain. EXAM: DG HIP (WITH OR WITHOUT PELVIS) 2-3V LEFT COMPARISON:  March 26, 2017 FINDINGS: There is acute, nondisplaced fracture of the left inferior pubic ramus. Chronic fracture deformities are seen involving the symphysis pubis and right inferior pubic ramus. A radiopaque intramedullary rod and compression screw device are seen within the proximal left femur. There is moderate to marked severity degenerative changes involving both hips, in the form of joint space narrowing, acetabular sclerosis and lateral acetabular bony spurring. Mild soft tissue swelling is seen along the lateral aspect of the proximal left femoral shaft. IMPRESSION: 1. Nondisplaced fracture of the left inferior pubic ramus. CT correlation is recommended. 2. Chronic fracture deformities of the symphysis pubis and bilateral inferior pubic rami. 3. Moderate to marked severity degenerative changes of the bilateral hips. Electronically Signed   By: Aram Candela M.D.   On: 10/31/2022 17:58     Data  Reviewed: Relevant notes from primary care and specialist visits, past discharge summaries as available in EHR, including Care Everywhere. Prior diagnostic testing as pertinent to current admission diagnoses Updated medications and problem lists for reconciliation ED course, including vitals, labs,  imaging, treatment and response to treatment Triage notes, nursing and pharmacy notes and ED provider's notes Notable results as noted in HPI   Assessment and Plan: >> Sacral fracture/inferior pubic ramus fracture: Patient admitted for observation overnight and pain control physical therapy evaluation tomorrow morning and additional recommendations for therapy to regain baseline functional status. Will continue patient on her oxycodone. Orthopedic and neurosurgery consult as deemed appropriate.  >> Fall at home: Patient had a mechanical fall this time similar to her previous episode however is at high risk of falls due to age and would benefit from gait training and strengthening exercises. Fall precautions, PT evaluation.  Patient will need an over home to make home fall proof  to prevent and/or limit future falls and traumas.   >> CKD stage IV: CMP ordered and pending. Avoid contrast and renally dose needed medications.   >> COPD: Stable currently at 2 L .  SpO2: 95 % O2 Flow Rate (L/min): 2 L/min  >> CAD: Will continue patient on aspirin 81, atorvastatin. EKG ordered.   >> Hypertension: Vitals:   10/31/22 1647 10/31/22 2309  BP: 132/87 126/84  Med rec is still pending. Not sure if patient is taking the 15 mg of Imdur. Currently patient's blood pressure is within normal limits.  DVT prophylaxis: heparin   Consults: none  Advance Care Planning:   Code Status: Full Code    Family Communication: Daughter.  Disposition Plan: Back to previous home environment  Severity of Illness: The appropriate patient status for this patient is OBSERVATION. Observation status is judged  to be reasonable and necessary in order to provide the required intensity of service to ensure the patient's safety. The patient's presenting symptoms, physical exam findings, and initial radiographic and laboratory data in the context of their medical condition is felt to place them at decreased risk for further clinical deterioration. Furthermore, it is anticipated that the patient will be medically stable for discharge from the hospital within 2 midnights of admission.   Author: Gertha Calkin, MD 11/01/2022 1:53 AM  For on call review [email or web address].

## 2022-11-01 NOTE — ED Notes (Signed)
Report called to janet rn cpod nurse  pt to rm [id number]

## 2022-11-01 NOTE — Discharge Summary (Signed)
Physician Discharge Summary   Patient: Christine Burnett MRN: [id number] DOB: September 11, 1940  Admit date:     10/31/2022  Discharge date: 11/01/22  Discharge Physician: Marcelino Duster   PCP: Danelle Berry, PA-C   Recommendations at discharge:    PCP follow up. PT as outpatient.  Discharge Diagnoses: Principal Problem:   Closed fracture of left inferior pubic ramus, initial encounter (HCC) Active Problems:   HTN (hypertension)   Stage 3b chronic kidney disease (HCC)   CKD (chronic kidney disease) stage 4, GFR 15-29 ml/min (HCC)   COPD (chronic obstructive pulmonary disease) (HCC)   Coronary artery disease   Inferior pubic ramus fracture, left, closed, initial encounter (HCC)   Fall at home, initial encounter   Sacral fracture, closed (HCC)  Resolved Problems:   * No resolved hospital problems. *  Hospital Course: Christine Burnett is a 82 y.o. female with medical history significant for COPD, history of lung cancer, osteoporosis presenting after a fall today. Initial imaging shows a fracture of the pubic rami CT shows sacral fracture as well along with her pubic rami fracture.  Patient was admitted for pain control.  Patient got IV morphine, Percocet with improvement of her pain.  States that she does not want any BP medications as she had low blood pressures. She is able to work with PT, recommended outpatient PT services.  Case management involved on admission services.  She is hemodynamically stable to be discharged home.  Advised her to follow-up with PCP upon discharge as instructed.  Patient understands and agrees with the discharge plan.      Pain control - [employer] Controlled Substance Reporting System database was reviewed. and patient was instructed, not to drive, operate heavy machinery, perform activities at heights, swimming or participation in water activities or provide baby-sitting services while on Pain, Sleep and Anxiety Medications; until their outpatient  Physician has advised to do so again. Also recommended to not to take more than prescribed Pain, Sleep and Anxiety Medications.  Consultants: none Procedures performed: none  Disposition: Home health Diet recommendation:  Discharge Diet Orders (From admission, onward)     Start     Ordered   11/01/22 0000  Diet - low sodium heart healthy        11/01/22 1320           Cardiac diet DISCHARGE MEDICATION: Allergies as of 11/01/2022       Reactions   Zithromax [azithromycin]    Cefuroxime Axetil Rash   Ciprofloxacin Nausea Only   Unknown   Codeine Other (See Comments)   Patient does not like how the medication makes you feel         Medication List     STOP taking these medications    azithromycin 250 MG tablet Commonly known as: ZITHROMAX   isosorbide mononitrate 30 MG 24 hr tablet Commonly known as: IMDUR       TAKE these medications    acetaminophen 650 MG CR tablet Commonly known as: TYLENOL Take 1 tablet (650 mg total) by mouth 4 (four) times daily as needed for pain.   aspirin EC 81 MG tablet Take 81 mg by mouth at bedtime. Swallow whole.   atorvastatin 40 MG tablet Commonly known as: LIPITOR Take 1 tablet (40 mg total) by mouth at bedtime.   Calcium 200 MG Tabs Take 300 mg by mouth every Monday, Tuesday, Wednesday, Thursday, and Friday.   fluticasone furoate-vilanterol 100-25 MCG/ACT Aepb Commonly known as: BREO ELLIPTA Inhale 1  puff into the lungs daily.   melatonin 3 MG Tabs tablet Take 3 mg by mouth at bedtime.   multivitamin capsule Take 1 capsule by mouth daily.   PARoxetine 40 MG tablet Commonly known as: PAXIL TAKE 1 TABLET BY MOUTH ONCE DAILY IN THE MORNING   Ventolin HFA 108 (90 Base) MCG/ACT inhaler Generic drug: albuterol INHALE 2 PUFFS BY MOUTH EVERY 6 HOURS AS NEEDED FOR WHEEZING OR SHORTNESS OF BREATH   Vitamin D3 50 MCG (2000 UT) Tabs Take 2,000 Units by mouth at bedtime.        Discharge Exam: Filed Weights    11/01/22 0901  Weight: 63 kg   Lungs- clears. Heart- S1, S2 heard. Skin- warm and dry. Neuro- AAOX3, able to move her extremities.  Condition at discharge: stable  The results of significant diagnostics from this hospitalization (including imaging, microbiology, ancillary and laboratory) are listed below for reference.   Imaging Studies: CT Lumbar Spine Wo Contrast  Result Date: 10/31/2022 CLINICAL DATA:  Lumbar radiculopathy trauma, fall. Low back pain and left hip pain. EXAM: CT LUMBAR SPINE WITHOUT CONTRAST TECHNIQUE: Multidetector CT imaging of the lumbar spine was performed without intravenous contrast administration. Multiplanar CT image reconstructions were also generated. RADIATION DOSE REDUCTION: This exam was performed according to the departmental dose-optimization program which includes automated exposure control, adjustment of the mA and/or kV according to patient size and/or use of iterative reconstruction technique. COMPARISON:  CT examination dated September 24, 2021 FINDINGS: Segmentation:  5 non rib-bearing lumbar type vertebral bodies are present. The lowest fully formed vertebral body is L5. Alignment: Normal. Vertebrae: Superior endplate deformity of elbow is unchanged. No acute fracture or subluxation. Paraspinal and other soft tissues: Aneurysmal dilatation of the infrarenal abdominal aorta measuring up to 3.2 cm. Disc levels: T12-L1: No significant disc bulge, spinal canal or neural foraminal stenosis. Minimal protrusion of the superior endplate of L1 into the spinal canal. Mild facet joint arthropathy. L1-L2: No significant disc bulge, spinal canal or neural foraminal stenosis. L2-L3: Mild circumferential disc bulge. Facet joint arthropathy. Mild bilateral lateral recess stenosis. No significant neural foraminal stenosis. L3-L4: Disc height loss and disc osteophyte complex. Mild bilateral facet joint arthropathy. Mild lateral recess stenosis bilaterally. No significant neural  foraminal stenosis. L4-L5: Disc height loss and disc bulge with moderate bilateral lateral recess stenosis. Mild-to-moderate bilateral facet joint arthropathy. Mild left neural foraminal stenosis. L5-S1: Disc height loss and disc osteophyte complex. Moderate bilateral facet joint arthropathy. Mild bilateral neural foraminal stenosis, left worse than the right. IMPRESSION: 1. No acute fracture or subluxation. 2. Multilevel degenerative disc and joint disease as described above. 3. 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 Recommend follow-up every 3 years. Electronically Signed   By: Larose Hires D.O.   On: 10/31/2022 21:13   CT PELVIS WO CONTRAST  Result Date: 10/31/2022 CLINICAL DATA:  Recent fall with left hip pain, initial encounter EXAM: CT PELVIS WITHOUT CONTRAST TECHNIQUE: Multidetector CT imaging of the pelvis was performed following the standard protocol without intravenous contrast. RADIATION DOSE REDUCTION: This exam was performed according to the departmental dose-optimization program which includes automated exposure control, adjustment of the mA and/or kV according to patient size and/or use of iterative reconstruction technique. COMPARISON:  Plain film from earlier in the same day, PET-CT from 01/31/2022. FINDINGS: Urinary Tract:  Bladder is partially distended. Bowel: Fecal material is noted scattered throughout the colon. Diverticular changes noted without evidence of diverticulitis. The appendix is within normal limits. Visualized small  bowel is unremarkable. Vascular/Lymphatic: Diffuse atherosclerotic calcifications of the abdominal aorta are noted. Infrarenal aorta shows aneurysmal dilatation up to 3.3 cm. This is stable in appearance from the prior PET-CT. No adenopathy is noted. Reproductive:  No mass or other significant abnormality Other:  None. Musculoskeletal: Postsurgical changes are noted in the proximal left femur. Undisplaced left inferior pubic ramus fracture is  seen. Superior pubic ramus fracture is intact. No fracture in the proximal left femur is seen. An undisplaced fracture is noted in the sacrum on the left at the S1 level. Degenerative changes of the pubic symphysis are noted. IMPRESSION: Undisplaced fracture of the left inferior pubic ramus. An associated left sacral fracture is noted as well. Postsurgical changes are seen in the proximal left femur. No acute abnormality is noted. Abdominal aortic aneurysm measuring 3.3 cm infrarenal. Recommend follow-up every 3 years. Reference: J Am Coll Radiol 2013;10:789-794. Electronically Signed   By: Alcide Clever M.D.   On: 10/31/2022 20:43   DG HIP UNILAT WITH PELVIS 2-3 VIEWS LEFT  Result Date: 10/31/2022 CLINICAL DATA:  Left hip pain. EXAM: DG HIP (WITH OR WITHOUT PELVIS) 2-3V LEFT COMPARISON:  March 26, 2017 FINDINGS: There is acute, nondisplaced fracture of the left inferior pubic ramus. Chronic fracture deformities are seen involving the symphysis pubis and right inferior pubic ramus. A radiopaque intramedullary rod and compression screw device are seen within the proximal left femur. There is moderate to marked severity degenerative changes involving both hips, in the form of joint space narrowing, acetabular sclerosis and lateral acetabular bony spurring. Mild soft tissue swelling is seen along the lateral aspect of the proximal left femoral shaft. IMPRESSION: 1. Nondisplaced fracture of the left inferior pubic ramus. CT correlation is recommended. 2. Chronic fracture deformities of the symphysis pubis and bilateral inferior pubic rami. 3. Moderate to marked severity degenerative changes of the bilateral hips. Electronically Signed   By: Aram Candela M.D.   On: 10/31/2022 17:58   DG Bone Density  Result Date: 10/26/2022 EXAM: DUAL X-RAY ABSORPTIOMETRY (DXA) FOR BONE MINERAL DENSITY IMPRESSION: Your patient Genelle Toller completed a BMD test on 10/26/2022 using the [employer] iDXA DXA System (software  version: 14.10) manufactured by [employer]. The following summarizes the results of our evaluation. Technologist: SCE PATIENT BIOGRAPHICAL: Name: Paiden, [place] Patient ID: [id number] Birth Date: [birth year] Height: 66.0 in. Gender: Female Exam Date: 10/26/2022 Weight: 134.0 lbs. Indications: Advanced Age, Caucasian, COPD, Height Loss, History of kidney disease, History of Lung Cancer, History of Radiation, Osteoporotic, Postmenopausal, Vitamin D Deficiency Fractures: Treatments: Calcium, Multi-Vitamin, Vitamin D DENSITOMETRY RESULTS: Site        Region Measured Date Measured Age WHO Classification Young Adult T-score BMD         %Change vs. Previous Significant Change (*) AP Spine L1-L3 10/26/2022 82.6 Osteopenia -1.6 0.982 g/cm2 6.6% Yes AP Spine L1-L3 11/01/2011 71.6 Osteopenia -2.1 0.921 g/cm2 - - Right Femur Total 10/26/2021 82.6 Osteoporosis -2.5 0.692 g/cm2 -14.5% Yes Right Femur Total 11/01/2011 71.6 Osteopenia -1.6 0.809 g/cm2 - - ASSESSMENT: The BMD measured at Femur Total is 0.692 g/cm2 with a T-score of -2.5. This patient is considered osteoporotic according to World Health Organization [facility]) criteria. The scan quality is good. L4 was excluded due to degenerative changes. Left femur was excluded due to surgical hardware. Compared with prior study, there has been a significant increase in the spine. Compared with prior study, there has been a significant decrease in the total hip. World Health Organization [facility]) criteria  for post-menopausal, Caucasian Women: Normal:                   T-score at or above -1 SD Osteopenia/low bone mass: T-score between -1 and -2.5 SD Osteoporosis:             T-score at or below -2.5 SD RECOMMENDATIONS: 1. All patients should optimize calcium and vitamin D intake. 2. Consider FDA-approved medical therapies in postmenopausal women and men aged 75 years and older, based on the following: a. A hip or vertebral(clinical or morphometric) fracture b. T-score <  -2.5 at the femoral neck or spine after appropriate evaluation to exclude secondary causes c. Low bone mass (T-score between -1.0 and -2.5 at the femoral neck or spine) and a 10-year probability of a hip fracture > 3% or a 10-year probability of a major osteoporosis-related fracture > 20% based on the [place]-adapted WHO algorithm 3. Clinician judgment and/or patient preferences may indicate treatment for people with 10-year fracture probabilities above or below these levels FOLLOW-UP: People with diagnosed cases of osteoporosis or at high risk for fracture should have regular bone mineral density tests. For patients eligible for Medicare, routine testing is allowed once every 2 years. The testing frequency can be increased to one year for patients who have rapidly progressing disease, those who are receiving or discontinuing medical therapy to restore bone mass, or have additional risk factors. I have reviewed this report, and agree with the above findings. [facility] Electronically Signed   By: Frederico Hamman M.D.   On: 10/26/2022 16:44    Microbiology: Results for orders placed or performed during the hospital encounter of 03/16/21  Resp Panel by RT-PCR (Flu A&B, Covid) Nasopharyngeal Swab     Status: None   Collection Time: 03/15/21 11:23 PM   Specimen: Nasopharyngeal Swab; Nasopharyngeal(NP) swabs in vial transport medium  Result Value Ref Range Status   SARS Coronavirus 2 by RT PCR NEGATIVE NEGATIVE Final    Comment: (NOTE) SARS-CoV-2 target nucleic acids are NOT DETECTED.  The SARS-CoV-2 RNA is generally detectable in upper respiratory specimens during the acute phase of infection. The lowest concentration of SARS-CoV-2 viral copies this assay can detect is 138 copies/mL. A negative result does not preclude SARS-Cov-2 infection and should not be used as the sole basis for treatment or other patient management decisions. A negative result may occur with  improper specimen  collection/handling, submission of specimen other than nasopharyngeal swab, presence of viral mutation(s) within the areas targeted by this assay, and inadequate number of viral copies(<138 copies/mL). A negative result must be combined with clinical observations, patient history, and epidemiological information. The expected result is Negative.  Fact Sheet for Patients:  BloggerCourse.com  Fact Sheet for Healthcare Providers:  SeriousBroker.it  This test is no t yet approved or cleared by the [place] FDA and  has been authorized for detection and/or diagnosis of SARS-CoV-2 by FDA under an Emergency Use Authorization (EUA). This EUA will remain  in effect (meaning this test can be used) for the duration of the COVID-19 declaration under Section 564(b)(1) of the Act, 21 U.S.C.section 360bbb-3(b)(1), unless the authorization is terminated  or revoked sooner.       Influenza A by PCR NEGATIVE NEGATIVE Final   Influenza B by PCR NEGATIVE NEGATIVE Final    Comment: (NOTE) The Xpert Xpress SARS-CoV-2/FLU/RSV plus assay is intended as an aid in the diagnosis of influenza from Nasopharyngeal swab specimens and should not be used as a sole  basis for treatment. Nasal washings and aspirates are unacceptable for Xpert Xpress SARS-CoV-2/FLU/RSV testing.  Fact Sheet for Patients: BloggerCourse.com  Fact Sheet for Healthcare Providers: SeriousBroker.it  This test is not yet approved or cleared by the [place] FDA and has been authorized for detection and/or diagnosis of SARS-CoV-2 by FDA under an Emergency Use Authorization (EUA). This EUA will remain in effect (meaning this test can be used) for the duration of the COVID-19 declaration under Section 564(b)(1) of the Act, 21 U.S.C. section 360bbb-3(b)(1), unless the authorization is terminated or revoked.  Performed at [facility], [address] [place], [place] [address]     Labs: CBC: Recent Labs  Lab 11/01/22 0449  WBC 7.7  HGB 12.1  HCT 36.3  MCV 99.7  PLT 189   Basic Metabolic Panel: Recent Labs  Lab 11/01/22 0449  NA 137  K 4.6  CL 104  CO2 27  GLUCOSE 105*  BUN 30*  CREATININE 1.73*  CALCIUM 9.3   Liver Function Tests: Recent Labs  Lab 11/01/22 0449  AST 23  ALT 16  ALKPHOS 93  BILITOT 1.0  PROT 7.3  ALBUMIN 3.6   Discharge time spent: greater than 30 minutes.  Signed: Marcelino Duster, MD [facility] 11/01/2022

## 2022-11-01 NOTE — TOC Progression Note (Signed)
Transition of Care [facility]) - Progression Note    Patient Details  Name: Christine Burnett MRN: [id number] Date of Birth: [birth year]  [facility]) CM/SW Contact  Marlowe Sax, RN Phone Number: 11/01/2022, 9:36 AM  Clinical Narrative:   She lives at home with her daughter who is at work during the day but patient is able to do for herself. She does have a 2 L nasal cannula at home that she wears for COPD as needed.  PT and OT to eval, TOC to follow and assist with needs and DC planning    Expected Discharge Plan: Home w Home Health Services Barriers to Discharge: Continued Medical Work up  Expected Discharge Plan and Services   Discharge Planning Services: CM Consult   Living arrangements for the past 2 months: Single Family Home                                       Social Determinants of Health (SDOH) Interventions SDOH Screenings   Food Insecurity: No Food Insecurity (11/01/2022)  Housing: Patient Unable To Answer (11/01/2022)  Transportation Needs: No Transportation Needs (11/01/2022)  Utilities: Not At Risk (11/01/2022)  Alcohol Screen: Low Risk  (02/08/2021)  Depression (PHQ2-9): Low Risk  (09/25/2022)  Financial Resource Strain: Low Risk  (09/25/2022)  Physical Activity: Insufficiently Active (09/25/2022)  Social Connections: Moderately Integrated (09/25/2022)  Stress: Stress Concern Present (02/08/2021)  Tobacco Use: Medium Risk (11/01/2022)    Readmission Risk Interventions     No data to display

## 2022-11-01 NOTE — TOC Progression Note (Signed)
Transition of Care [facility]) - Progression Note    Patient Details  Name: Christine Burnett MRN: [id number] Date of Birth: Jul 11, 1940  Transition of Care [facility]) CM/SW Contact  Marlowe Sax, RN Phone Number: 11/01/2022, 2:34 PM  Clinical Narrative:     Met with the patient, she wants to use EMS to go home, she has oxygen at home and DME she refuses HH and said she knows what to do and is fine  Expected Discharge Plan: Home w Home Health Services Barriers to Discharge: Continued Medical Work up  Expected Discharge Plan and Services   Discharge Planning Services: CM Consult   Living arrangements for the past 2 months: Single Family Home Expected Discharge Date: 11/01/22                                     Social Determinants of Health (SDOH) Interventions SDOH Screenings   Food Insecurity: No Food Insecurity (11/01/2022)  Housing: Patient Unable To Answer (11/01/2022)  Transportation Needs: No Transportation Needs (11/01/2022)  Utilities: Not At Risk (11/01/2022)  Alcohol Screen: Low Risk  (02/08/2021)  Depression (PHQ2-9): Low Risk  (09/25/2022)  Financial Resource Strain: Low Risk  (09/25/2022)  Physical Activity: Insufficiently Active (09/25/2022)  Social Connections: Moderately Integrated (09/25/2022)  Stress: Stress Concern Present (02/08/2021)  Tobacco Use: Medium Risk (11/01/2022)    Readmission Risk Interventions     No data to display

## 2022-11-02 DIAGNOSIS — N1832 Chronic kidney disease, stage 3b: Secondary | ICD-10-CM | POA: Diagnosis not present

## 2022-11-02 DIAGNOSIS — W19XXXA Unspecified fall, initial encounter: Secondary | ICD-10-CM | POA: Diagnosis not present

## 2022-11-02 DIAGNOSIS — I10 Essential (primary) hypertension: Secondary | ICD-10-CM | POA: Diagnosis not present

## 2022-11-02 DIAGNOSIS — S32110A Nondisplaced Zone I fracture of sacrum, initial encounter for closed fracture: Secondary | ICD-10-CM | POA: Diagnosis not present

## 2022-11-02 DIAGNOSIS — S32592A Other specified fracture of left pubis, initial encounter for closed fracture: Secondary | ICD-10-CM | POA: Diagnosis not present

## 2022-11-02 NOTE — Progress Notes (Signed)
Reviewed discharge instructions daughter, [name]. Daughter verbalized understanding. Pt discharged with all personal belongings. Staff wheeled pt out. Pt transported to home via family car.

## 2022-11-02 NOTE — TOC Transition Note (Signed)
Transition of Care [facility]) - CM/SW Discharge Note   Patient Details  Name: Christine Burnett MRN: [id number] Date of Birth: 08-Dec-1940  Transition of Care [facility]) CM/SW Contact:  Garret Reddish, RN Phone Number: 11/02/2022, 3:49 PM   Clinical Narrative:   Chart reviewed.  Noted that patient has orders for discharge today.    I have spoken with patient's daughter Sedalia Muta. I have informed Diane that provider has ordered Home Health for patient on discharge.  Diane did not have a home health preference.  Home Health orders are for [facility] PT, in-home aid, and SW.    I have asked Barbara Cower with [facility] to accept home health referral.  Diane reports that patient does have a 2 wheeled rolling walker at home.  Diane reports that patient lives with her other daughter [name].    I have spoken with patient's daughter [name].  [name] reports that she works.  [name] reports that patient would need to be able to walk up 3 steps to get into the home.  [name] also reports that patient would need to be albe to stay by herself as she is not home during the day.  I have informed [name] of the services that have been arranged for Home Health.  [name] has requested to speak with the provider.  I have made Dr. Clide Dales aware.    TOC will continue to follow progress of patient.      Final next level of care: Home w Home Health Services Barriers to Discharge: No Barriers Identified   Patient Goals and CMS Choice CMS Medicare.gov Compare Post Acute Care list provided to:: Patient Represenative (must comment) (Spoke with patient's daughter Sedalia Muta) Choice offered to / list presented to : Adult Children  Discharge Placement                    Name of family member notified: Diane    Discharge Plan and Services Additional resources added to the After Visit Summary for     Discharge Planning Services: CM Consult                      HH Arranged: PT, Nurse's Aide, Social Work [facility] Agency: [employer]  ([facility]) Date HH Agency Contacted: 11/02/22 Time HH Agency Contacted: 1500 Representative spoke with at [facility] Agency: Barbara Cower  Social Determinants of Health (SDOH) Interventions SDOH Screenings   Food Insecurity: No Food Insecurity (11/01/2022)  Housing: Patient Unable To Answer (11/01/2022)  Transportation Needs: No Transportation Needs (11/01/2022)  Utilities: Not At Risk (11/01/2022)  Alcohol Screen: Low Risk  (02/08/2021)  Depression (PHQ2-9): Low Risk  (09/25/2022)  Financial Resource Strain: Low Risk  (09/25/2022)  Physical Activity: Insufficiently Active (09/25/2022)  Social Connections: Moderately Integrated (09/25/2022)  Stress: Stress Concern Present (02/08/2021)  Tobacco Use: Medium Risk (11/01/2022)     Readmission Risk Interventions     No data to display

## 2022-11-02 NOTE — Plan of Care (Signed)
  Problem: Education: Goal: Knowledge of General Education information will improve Description: Including pain rating scale, medication(s)/side effects and non-pharmacologic comfort measures 11/02/2022 1910 by Zakarie Sturdivant Bet, LPN Outcome: Adequate for Discharge 11/02/2022 1111 by Liannah Yarbough Bet, LPN Outcome: Progressing   Problem: Health Behavior/Discharge Planning: Goal: Ability to manage health-related needs will improve 11/02/2022 1910 by Nury Nebergall Bet, LPN Outcome: Adequate for Discharge 11/02/2022 1111 by Evelia Waskey Bet, LPN Outcome: Progressing   Problem: Clinical Measurements: Goal: Ability to maintain clinical measurements within normal limits will improve 11/02/2022 1910 by Lennyn Gange Bet, LPN Outcome: Adequate for Discharge 11/02/2022 1111 by Nello Corro Bet, LPN Outcome: Progressing Goal: Will remain free from infection 11/02/2022 1910 by Jodeci Rini Bet, LPN Outcome: Adequate for Discharge 11/02/2022 1111 by Etai Copado Bet, LPN Outcome: Progressing Goal: Diagnostic test results will improve 11/02/2022 1910 by Keyion Knack Bet, LPN Outcome: Adequate for Discharge 11/02/2022 1111 by Kaniya Trueheart Bet, LPN Outcome: Progressing Goal: Respiratory complications will improve 11/02/2022 1910 by Carmin Alvidrez Bet, LPN Outcome: Adequate for Discharge 11/02/2022 1111 by Comer Devins Bet, LPN Outcome: Progressing Goal: Cardiovascular complication will be avoided 11/02/2022 1910 by Jacquel Redditt Bet, LPN Outcome: Adequate for Discharge 11/02/2022 1111 by Fordyce Lepak Bet, LPN Outcome: Progressing   Problem: Activity: Goal: Risk for activity intolerance will decrease 11/02/2022 1910 by Dazha Kempa Bet, LPN Outcome: Adequate for Discharge 11/02/2022 1111 by Meleah Demeyer Bet, LPN Outcome: Progressing   Problem: Nutrition: Goal: Adequate nutrition will be maintained 11/02/2022 1910 by Levone Otten Bet, LPN Outcome: Adequate for Discharge 11/02/2022 1111 by Thoma Paulsen Bet, LPN Outcome: Progressing    Problem: Coping: Goal: Level of anxiety will decrease 11/02/2022 1910 by Lex Linhares Bet, LPN Outcome: Adequate for Discharge 11/02/2022 1111 by November Sypher Bet, LPN Outcome: Progressing   Problem: Elimination: Goal: Will not experience complications related to bowel motility 11/02/2022 1910 by Cledis Sohn Bet, LPN Outcome: Adequate for Discharge 11/02/2022 1111 by Clarice Zulauf Bet, LPN Outcome: Progressing Goal: Will not experience complications related to urinary retention 11/02/2022 1910 by Marissa Weaver Bet, LPN Outcome: Adequate for Discharge 11/02/2022 1111 by Bari Handshoe Bet, LPN Outcome: Progressing   Problem: Pain Managment: Goal: General experience of comfort will improve 11/02/2022 1910 by Edessa Jakubowicz Bet, LPN Outcome: Adequate for Discharge 11/02/2022 1111 by Dessirae Scarola Bet, LPN Outcome: Progressing   Problem: Safety: Goal: Ability to remain free from injury will improve 11/02/2022 1910 by Syrai Gladwin Bet, LPN Outcome: Adequate for Discharge 11/02/2022 1111 by Ayelet Gruenewald Bet, LPN Outcome: Progressing   Problem: Skin Integrity: Goal: Risk for impaired skin integrity will decrease 11/02/2022 1910 by Dajanee Voorheis Bet, LPN Outcome: Adequate for Discharge 11/02/2022 1111 by Maybelline Kolarik Bet, LPN Outcome: Progressing   Problem: Acute Rehab PT Goals(only PT should resolve) Goal: Pt Will Go Supine/Side To Sit Outcome: Adequate for Discharge Goal: Patient Will Transfer Sit To/From Stand Outcome: Adequate for Discharge Goal: Pt Will Ambulate Outcome: Adequate for Discharge Goal: Pt Will Go Up/Down Stairs Outcome: Adequate for Discharge

## 2022-11-02 NOTE — Discharge Summary (Signed)
Physician Discharge Summary   Patient: Christine Burnett MRN: [id number] DOB: [birth year]  Admit date:     10/31/2022  Discharge date: 11/02/22  Discharge Physician: Marcelino Duster   PCP: Danelle Berry, PA-C   Recommendations at discharge:    PCP post discharge follow up - pain control, PT needs.  Discharge Diagnoses: Principal Problem:   Closed fracture of left inferior pubic ramus, initial encounter (HCC) Active Problems:   HTN (hypertension)   Stage 3b chronic kidney disease (HCC)   CKD (chronic kidney disease) stage 4, GFR 15-29 ml/min (HCC)   COPD (chronic obstructive pulmonary disease) (HCC)   Coronary artery disease   Inferior pubic ramus fracture, left, closed, initial encounter [facility])   Fall   Sacral fracture, closed (HCC)  Resolved Problems:   * No resolved hospital problems. *  Hospital Course: Christine Burnett is a 82 y.o. female with medical history significant for COPD, history of lung cancer, osteoporosis presenting after a fall today. Initial imaging shows a fracture of the pubic rami CT shows sacral fracture as well along with her pubic rami fracture.  Patient was admitted for pain control.  Patient got IV morphine, Percocet with improvement of her pain.  States that she does not want any BP medications as she had low blood pressures. She is able to work with PT, recommended outpatient PT services.  Case management involved on admission services.  She is hemodynamically stable to be discharged home, but she and her daughter had concerns about safety at home.  PT re- evaluated her able to climb stairs, advised HH PT 4x week. I advised her to follow-up with PCP upon discharge as instructed.  Patient and daughter understands and agrees with the discharge plan.       Pain control - [employer] Controlled Substance Reporting System database was reviewed. and patient was instructed, not to drive, operate heavy machinery, perform activities at heights, swimming or  participation in water activities or provide baby-sitting services while on Pain, Sleep and Anxiety Medications; until their outpatient Physician has advised to do so again. Also recommended to not to take more than prescribed Pain, Sleep and Anxiety Medications.  Consultants: none Procedures performed: none  Disposition: Home health Diet recommendation:  Discharge Diet Orders (From admission, onward)     Start     Ordered   11/01/22 0000  Diet - low sodium heart healthy        11/01/22 1320           Cardiac diet DISCHARGE MEDICATION: Allergies as of 11/02/2022       Reactions   Zithromax [azithromycin]    Cefuroxime Axetil Rash   Ciprofloxacin Nausea Only   Unknown   Codeine Other (See Comments)   Patient does not like how the medication makes you feel         Medication List     STOP taking these medications    azithromycin 250 MG tablet Commonly known as: ZITHROMAX   isosorbide mononitrate 30 MG 24 hr tablet Commonly known as: IMDUR       TAKE these medications    acetaminophen 650 MG CR tablet Commonly known as: TYLENOL Take 1 tablet (650 mg total) by mouth 4 (four) times daily as needed for pain.   aspirin EC 81 MG tablet Take 81 mg by mouth at bedtime. Swallow whole.   atorvastatin 40 MG tablet Commonly known as: LIPITOR Take 1 tablet (40 mg total) by mouth at bedtime.   Calcium 200  MG Tabs Take 300 mg by mouth every Monday, Tuesday, Wednesday, Thursday, and Friday.   fluticasone furoate-vilanterol 100-25 MCG/ACT Aepb Commonly known as: BREO ELLIPTA Inhale 1 puff into the lungs daily.   melatonin 3 MG Tabs tablet Take 3 mg by mouth at bedtime.   multivitamin capsule Take 1 capsule by mouth daily.   PARoxetine 40 MG tablet Commonly known as: PAXIL TAKE 1 TABLET BY MOUTH ONCE DAILY IN THE MORNING   Ventolin HFA 108 (90 Base) MCG/ACT inhaler Generic drug: albuterol INHALE 2 PUFFS BY MOUTH EVERY 6 HOURS AS NEEDED FOR WHEEZING OR SHORTNESS  OF BREATH   Vitamin D3 50 MCG (2000 UT) Tabs Take 2,000 Units by mouth at bedtime.        Discharge Exam: Filed Weights   11/01/22 0901  Weight: 63 kg   General - Elderly Caucasian female, sitting in no apparent distress. Lungs- clear. Heart- S1, S2 heard. Skin- warm and dry. Neuro- AAOX3, able to move her extremities.  Condition at discharge: stable  The results of significant diagnostics from this hospitalization (including imaging, microbiology, ancillary and laboratory) are listed below for reference.   Imaging Studies: CT Lumbar Spine Wo Contrast  Result Date: 10/31/2022 CLINICAL DATA:  Lumbar radiculopathy trauma, fall. Low back pain and left hip pain. EXAM: CT LUMBAR SPINE WITHOUT CONTRAST TECHNIQUE: Multidetector CT imaging of the lumbar spine was performed without intravenous contrast administration. Multiplanar CT image reconstructions were also generated. RADIATION DOSE REDUCTION: This exam was performed according to the departmental dose-optimization program which includes automated exposure control, adjustment of the mA and/or kV according to patient size and/or use of iterative reconstruction technique. COMPARISON:  CT examination dated September 24, 2021 FINDINGS: Segmentation:  5 non rib-bearing lumbar type vertebral bodies are present. The lowest fully formed vertebral body is L5. Alignment: Normal. Vertebrae: Superior endplate deformity of elbow is unchanged. No acute fracture or subluxation. Paraspinal and other soft tissues: Aneurysmal dilatation of the infrarenal abdominal aorta measuring up to 3.2 cm. Disc levels: T12-L1: No significant disc bulge, spinal canal or neural foraminal stenosis. Minimal protrusion of the superior endplate of L1 into the spinal canal. Mild facet joint arthropathy. L1-L2: No significant disc bulge, spinal canal or neural foraminal stenosis. L2-L3: Mild circumferential disc bulge. Facet joint arthropathy. Mild bilateral lateral recess stenosis.  No significant neural foraminal stenosis. L3-L4: Disc height loss and disc osteophyte complex. Mild bilateral facet joint arthropathy. Mild lateral recess stenosis bilaterally. No significant neural foraminal stenosis. L4-L5: Disc height loss and disc bulge with moderate bilateral lateral recess stenosis. Mild-to-moderate bilateral facet joint arthropathy. Mild left neural foraminal stenosis. L5-S1: Disc height loss and disc osteophyte complex. Moderate bilateral facet joint arthropathy. Mild bilateral neural foraminal stenosis, left worse than the right. IMPRESSION: 1. No acute fracture or subluxation. 2. Multilevel degenerative disc and joint disease as described above. 3. 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 Recommend follow-up every 3 years. Electronically Signed   By: Larose Hires D.O.   On: 10/31/2022 21:13   CT PELVIS WO CONTRAST  Result Date: 10/31/2022 CLINICAL DATA:  Recent fall with left hip pain, initial encounter EXAM: CT PELVIS WITHOUT CONTRAST TECHNIQUE: Multidetector CT imaging of the pelvis was performed following the standard protocol without intravenous contrast. RADIATION DOSE REDUCTION: This exam was performed according to the departmental dose-optimization program which includes automated exposure control, adjustment of the mA and/or kV according to patient size and/or use of iterative reconstruction technique. COMPARISON:  Plain film from earlier in  the same day, PET-CT from 01/31/2022. FINDINGS: Urinary Tract:  Bladder is partially distended. Bowel: Fecal material is noted scattered throughout the colon. Diverticular changes noted without evidence of diverticulitis. The appendix is within normal limits. Visualized small bowel is unremarkable. Vascular/Lymphatic: Diffuse atherosclerotic calcifications of the abdominal aorta are noted. Infrarenal aorta shows aneurysmal dilatation up to 3.3 cm. This is stable in appearance from the prior PET-CT. No adenopathy is  noted. Reproductive:  No mass or other significant abnormality Other:  None. Musculoskeletal: Postsurgical changes are noted in the proximal left femur. Undisplaced left inferior pubic ramus fracture is seen. Superior pubic ramus fracture is intact. No fracture in the proximal left femur is seen. An undisplaced fracture is noted in the sacrum on the left at the S1 level. Degenerative changes of the pubic symphysis are noted. IMPRESSION: Undisplaced fracture of the left inferior pubic ramus. An associated left sacral fracture is noted as well. Postsurgical changes are seen in the proximal left femur. No acute abnormality is noted. Abdominal aortic aneurysm measuring 3.3 cm infrarenal. Recommend follow-up every 3 years. Reference: J Am Coll Radiol 2013;10:789-794. Electronically Signed   By: Alcide Clever M.D.   On: 10/31/2022 20:43   DG HIP UNILAT WITH PELVIS 2-3 VIEWS LEFT  Result Date: 10/31/2022 CLINICAL DATA:  Left hip pain. EXAM: DG HIP (WITH OR WITHOUT PELVIS) 2-3V LEFT COMPARISON:  March 26, 2017 FINDINGS: There is acute, nondisplaced fracture of the left inferior pubic ramus. Chronic fracture deformities are seen involving the symphysis pubis and right inferior pubic ramus. A radiopaque intramedullary rod and compression screw device are seen within the proximal left femur. There is moderate to marked severity degenerative changes involving both hips, in the form of joint space narrowing, acetabular sclerosis and lateral acetabular bony spurring. Mild soft tissue swelling is seen along the lateral aspect of the proximal left femoral shaft. IMPRESSION: 1. Nondisplaced fracture of the left inferior pubic ramus. CT correlation is recommended. 2. Chronic fracture deformities of the symphysis pubis and bilateral inferior pubic rami. 3. Moderate to marked severity degenerative changes of the bilateral hips. Electronically Signed   By: Aram Candela M.D.   On: 10/31/2022 17:58   DG Bone Density  Result  Date: 10/26/2022 EXAM: DUAL X-RAY ABSORPTIOMETRY (DXA) FOR BONE MINERAL DENSITY IMPRESSION: Your patient Silbia Blackford completed a BMD test on 10/26/2022 using the [employer] iDXA DXA System (software version: 14.10) manufactured by [employer]. The following summarizes the results of our evaluation. Technologist: SCE PATIENT BIOGRAPHICAL: Name: Mehar, Pellissier Patient ID: [id number] Birth Date: Dec 14, 1940 Height: 66.0 in. Gender: Female Exam Date: 10/26/2022 Weight: 134.0 lbs. Indications: Advanced Age, Caucasian, COPD, Height Loss, History of kidney disease, History of Lung Cancer, History of Radiation, Osteoporotic, Postmenopausal, Vitamin D Deficiency Fractures: Treatments: Calcium, Multi-Vitamin, Vitamin D DENSITOMETRY RESULTS: Site        Region Measured Date Measured Age WHO Classification Young Adult T-score BMD         %Change vs. Previous Significant Change (*) AP Spine L1-L3 10/26/2021 82.6 Osteopenia -1.6 0.982 g/cm2 6.6% Yes AP Spine L1-L3 11/01/2011 71.6 Osteopenia -2.1 0.921 g/cm2 - - Right Femur Total 10/26/2021 82.6 Osteoporosis -2.5 0.692 g/cm2 -14.5% Yes Right Femur Total 11/01/2011 71.6 Osteopenia -1.6 0.809 g/cm2 - - ASSESSMENT: The BMD measured at Femur Total is 0.692 g/cm2 with a T-score of -2.5. This patient is considered osteoporotic according to World Health Organization [facility]) criteria. The scan quality is good. L4 was excluded due to degenerative changes. Left femur  was excluded due to surgical hardware. Compared with prior study, there has been a significant increase in the spine. Compared with prior study, there has been a significant decrease in the total hip. World Science writer [facility]) criteria for post-menopausal, Caucasian Women: Normal:                   T-score at or above -1 SD Osteopenia/low bone mass: T-score between -1 and -2.5 SD Osteoporosis:             T-score at or below -2.5 SD RECOMMENDATIONS: 1. All patients should optimize calcium and vitamin D  intake. 2. Consider FDA-approved medical therapies in postmenopausal women and men aged 17 years and older, based on the following: a. A hip or vertebral(clinical or morphometric) fracture b. T-score < -2.5 at the femoral neck or spine after appropriate evaluation to exclude secondary causes c. Low bone mass (T-score between -1.0 and -2.5 at the femoral neck or spine) and a 10-year probability of a hip fracture > 3% or a 10-year probability of a major osteoporosis-related fracture > 20% based on the [place]-adapted WHO algorithm 3. Clinician judgment and/or patient preferences may indicate treatment for people with 10-year fracture probabilities above or below these levels FOLLOW-UP: People with diagnosed cases of osteoporosis or at high risk for fracture should have regular bone mineral density tests. For patients eligible for Medicare, routine testing is allowed once every 2 years. The testing frequency can be increased to one year for patients who have rapidly progressing disease, those who are receiving or discontinuing medical therapy to restore bone mass, or have additional risk factors. I have reviewed this report, and agree with the above findings. [facility] Electronically Signed   By: Frederico Hamman M.D.   On: 10/26/2022 16:44    Microbiology: Results for orders placed or performed during the hospital encounter of 03/16/21  Resp Panel by RT-PCR (Flu A&B, Covid) Nasopharyngeal Swab     Status: None   Collection Time: 03/15/21 11:23 PM   Specimen: Nasopharyngeal Swab; Nasopharyngeal(NP) swabs in vial transport medium  Result Value Ref Range Status   SARS Coronavirus 2 by RT PCR NEGATIVE NEGATIVE Final    Comment: (NOTE) SARS-CoV-2 target nucleic acids are NOT DETECTED.  The SARS-CoV-2 RNA is generally detectable in upper respiratory specimens during the acute phase of infection. The lowest concentration of SARS-CoV-2 viral copies this assay can detect is 138 copies/mL. A negative  result does not preclude SARS-Cov-2 infection and should not be used as the sole basis for treatment or other patient management decisions. A negative result may occur with  improper specimen collection/handling, submission of specimen other than nasopharyngeal swab, presence of viral mutation(s) within the areas targeted by this assay, and inadequate number of viral copies(<138 copies/mL). A negative result must be combined with clinical observations, patient history, and epidemiological information. The expected result is Negative.  Fact Sheet for Patients:  BloggerCourse.com  Fact Sheet for Healthcare Providers:  SeriousBroker.it  This test is no t yet approved or cleared by the [place] FDA and  has been authorized for detection and/or diagnosis of SARS-CoV-2 by FDA under an Emergency Use Authorization (EUA). This EUA will remain  in effect (meaning this test can be used) for the duration of the COVID-19 declaration under Section 564(b)(1) of the Act, 21 U.S.C.section 360bbb-3(b)(1), unless the authorization is terminated  or revoked sooner.       Influenza A by PCR NEGATIVE NEGATIVE Final   Influenza B  by PCR NEGATIVE NEGATIVE Final    Comment: (NOTE) The Xpert Xpress SARS-CoV-2/FLU/RSV plus assay is intended as an aid in the diagnosis of influenza from Nasopharyngeal swab specimens and should not be used as a sole basis for treatment. Nasal washings and aspirates are unacceptable for Xpert Xpress SARS-CoV-2/FLU/RSV testing.  Fact Sheet for Patients: BloggerCourse.com  Fact Sheet for Healthcare Providers: SeriousBroker.it  This test is not yet approved or cleared by the [place] FDA and has been authorized for detection and/or diagnosis of SARS-CoV-2 by FDA under an Emergency Use Authorization (EUA). This EUA will remain in effect (meaning this test can be used)  for the duration of the COVID-19 declaration under Section 564(b)(1) of the Act, 21 U.S.C. section 360bbb-3(b)(1), unless the authorization is terminated or revoked.  Performed at [facility], [address] [place], [place] [address]     Labs: CBC: Recent Labs  Lab 11/01/22 0449  WBC 7.7  HGB 12.1  HCT 36.3  MCV 99.7  PLT 189   Basic Metabolic Panel: Recent Labs  Lab 11/01/22 0449  NA 137  K 4.6  CL 104  CO2 27  GLUCOSE 105*  BUN 30*  CREATININE 1.73*  CALCIUM 9.3   Liver Function Tests: Recent Labs  Lab 11/01/22 0449  AST 23  ALT 16  ALKPHOS 93  BILITOT 1.0  PROT 7.3  ALBUMIN 3.6   CBG: No results for input(s): "GLUCAP" in the last 168 hours.  Discharge time spent: greater than 30 minutes.  Signed: Marcelino Duster, MD [facility] 11/02/2022

## 2022-11-02 NOTE — Plan of Care (Signed)
  Problem: Safety: Goal: Ability to remain free from injury will improve Outcome: Progressing   Problem: Pain Managment: Goal: General experience of comfort will improve Outcome: Progressing   Problem: Elimination: Goal: Will not experience complications related to bowel motility Outcome: Progressing   Problem: Coping: Goal: Level of anxiety will decrease Outcome: Progressing   Problem: Skin Integrity: Goal: Risk for impaired skin integrity will decrease Outcome: Progressing   Problem: Activity: Goal: Risk for activity intolerance will decrease Outcome: Progressing

## 2022-11-05 DIAGNOSIS — F419 Anxiety disorder, unspecified: Secondary | ICD-10-CM | POA: Diagnosis not present

## 2022-11-05 DIAGNOSIS — J309 Allergic rhinitis, unspecified: Secondary | ICD-10-CM | POA: Diagnosis not present

## 2022-11-05 DIAGNOSIS — Z85118 Personal history of other malignant neoplasm of bronchus and lung: Secondary | ICD-10-CM | POA: Diagnosis not present

## 2022-11-05 DIAGNOSIS — M800AXD Age-related osteoporosis with current pathological fracture, other site, subsequent encounter for fracture with routine healing: Secondary | ICD-10-CM | POA: Diagnosis not present

## 2022-11-05 DIAGNOSIS — Z556 Problems related to health literacy: Secondary | ICD-10-CM | POA: Diagnosis not present

## 2022-11-05 DIAGNOSIS — N184 Chronic kidney disease, stage 4 (severe): Secondary | ICD-10-CM | POA: Diagnosis not present

## 2022-11-05 DIAGNOSIS — Z8673 Personal history of transient ischemic attack (TIA), and cerebral infarction without residual deficits: Secondary | ICD-10-CM | POA: Diagnosis not present

## 2022-11-05 DIAGNOSIS — K5790 Diverticulosis of intestine, part unspecified, without perforation or abscess without bleeding: Secondary | ICD-10-CM | POA: Diagnosis not present

## 2022-11-05 DIAGNOSIS — Z7982 Long term (current) use of aspirin: Secondary | ICD-10-CM | POA: Diagnosis not present

## 2022-11-05 DIAGNOSIS — J439 Emphysema, unspecified: Secondary | ICD-10-CM | POA: Diagnosis not present

## 2022-11-05 DIAGNOSIS — Z9181 History of falling: Secondary | ICD-10-CM | POA: Diagnosis not present

## 2022-11-05 DIAGNOSIS — J449 Chronic obstructive pulmonary disease, unspecified: Secondary | ICD-10-CM | POA: Diagnosis not present

## 2022-11-05 DIAGNOSIS — Z7951 Long term (current) use of inhaled steroids: Secondary | ICD-10-CM | POA: Diagnosis not present

## 2022-11-05 DIAGNOSIS — I2510 Atherosclerotic heart disease of native coronary artery without angina pectoris: Secondary | ICD-10-CM | POA: Diagnosis not present

## 2022-11-05 DIAGNOSIS — F32A Depression, unspecified: Secondary | ICD-10-CM | POA: Diagnosis not present

## 2022-11-05 DIAGNOSIS — M8008XD Age-related osteoporosis with current pathological fracture, vertebra(e), subsequent encounter for fracture with routine healing: Secondary | ICD-10-CM | POA: Diagnosis not present

## 2022-11-05 DIAGNOSIS — Z87891 Personal history of nicotine dependence: Secondary | ICD-10-CM | POA: Diagnosis not present

## 2022-11-05 DIAGNOSIS — I129 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 stage 4 chronic kidney disease, or unspecified chronic kidney disease: Secondary | ICD-10-CM | POA: Diagnosis not present

## 2022-11-05 DIAGNOSIS — E213 Hyperparathyroidism, unspecified: Secondary | ICD-10-CM | POA: Diagnosis not present

## 2022-11-05 DIAGNOSIS — E785 Hyperlipidemia, unspecified: Secondary | ICD-10-CM | POA: Diagnosis not present

## 2022-11-05 DIAGNOSIS — M1990 Unspecified osteoarthritis, unspecified site: Secondary | ICD-10-CM | POA: Diagnosis not present

## 2022-11-07 ENCOUNTER — Telehealth: Payer: Self-pay | Admitting: Family Medicine

## 2022-11-07 NOTE — Telephone Encounter (Signed)
Home Health Verbal Orders - Caller/Agency: [place] [facility] Callback Number: [phone] Requesting PT Frequency:  2x4 1x5

## 2022-11-07 NOTE — Telephone Encounter (Signed)
Please schedule HFU  Called [place] left detailed vm: VO per Sheliah Mends

## 2022-11-08 NOTE — Telephone Encounter (Signed)
Called pt to schedule for a HFU. Pt can't come in until next week when her daughter returns from [place]. Pt will call back then

## 2022-11-09 ENCOUNTER — Telehealth: Payer: Self-pay

## 2022-11-09 DIAGNOSIS — N184 Chronic kidney disease, stage 4 (severe): Secondary | ICD-10-CM | POA: Diagnosis not present

## 2022-11-09 DIAGNOSIS — M800AXD Age-related osteoporosis with current pathological fracture, other site, subsequent encounter for fracture with routine healing: Secondary | ICD-10-CM | POA: Diagnosis not present

## 2022-11-09 DIAGNOSIS — M8008XD Age-related osteoporosis with current pathological fracture, vertebra(e), subsequent encounter for fracture with routine healing: Secondary | ICD-10-CM | POA: Diagnosis not present

## 2022-11-09 DIAGNOSIS — J439 Emphysema, unspecified: Secondary | ICD-10-CM | POA: Diagnosis not present

## 2022-11-09 DIAGNOSIS — I129 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 stage 4 chronic kidney disease, or unspecified chronic kidney disease: Secondary | ICD-10-CM | POA: Diagnosis not present

## 2022-11-09 DIAGNOSIS — J449 Chronic obstructive pulmonary disease, unspecified: Secondary | ICD-10-CM | POA: Diagnosis not present

## 2022-11-09 NOTE — Telephone Encounter (Signed)
Spoke with pt, cancel due having a fracture pelvis and just getting out of hospital will call to r/s

## 2022-11-10 ENCOUNTER — Inpatient Hospital Stay: Payer: Medicare Other | Admitting: Family Medicine

## 2022-11-10 ENCOUNTER — Encounter: Payer: Self-pay | Admitting: Family Medicine

## 2022-11-10 ENCOUNTER — Telehealth (INDEPENDENT_AMBULATORY_CARE_PROVIDER_SITE_OTHER): Payer: Medicare Other | Admitting: Family Medicine

## 2022-11-10 DIAGNOSIS — Z09 Encounter for follow-up examination after completed treatment for conditions other than malignant neoplasm: Secondary | ICD-10-CM | POA: Diagnosis not present

## 2022-11-10 DIAGNOSIS — Z0189 Encounter for other specified special examinations: Secondary | ICD-10-CM

## 2022-11-10 DIAGNOSIS — S32592A Other specified fracture of left pubis, initial encounter for closed fracture: Secondary | ICD-10-CM | POA: Diagnosis not present

## 2022-11-10 MED ORDER — TRAMADOL HCL 50 MG PO TABS
50.0000 mg | ORAL_TABLET | Freq: Three times a day (TID) | ORAL | 0 refills | Status: AC | PRN
Start: 2022-11-10 — End: 2022-11-15

## 2022-11-10 NOTE — Progress Notes (Unsigned)
Name: Christine Burnett   MRN: [id number]    DOB: February 13, 1941   Date:11/10/2022       Progress Note  Subjective:    Chief Complaint  Chief Complaint  Patient presents with   Hospitalization Follow-up    I connected with  Titus Mould  on 11/10/22 at 10:40 AM EDT by a video enabled telemedicine application and verified that I am speaking with the correct person using two identifiers.  I discussed the limitations of evaluation and management by telemedicine and the availability of in person appointments. The patient expressed understanding and agreed to proceed. Staff also discussed with the patient that there may be a patient responsible charge related to this service. Patient Location: home Provider Location: [facility] office Additional Individuals present: daughter in law - Clydie Braun  HPI HFU after fall and fx Inpt admitted for fall and multiple fractures - hurts when she gets up and puts weight on left leg  fracture of the pubic rami CT shows sacral fracture as well along with her pubic rami fracture   Tylenol 650 mg not controlling pain she will take maybe 2 doses a day  Recent BP's BP Readings from Last 3 Encounters:  11/02/22 129/69  09/25/22 136/82  08/01/22 136/80   99% SpO2 at home  HR 105-   She has been in a lot of pain and it has even stopped her from being able to do physical therapy  Admitted 5/28 to 5/30, admission note/labs/imaging reviewed     Patient Active Problem List   Diagnosis Date Noted   Inferior pubic ramus fracture, left, closed, initial encounter (HCC) 11/01/2022   Fall 11/01/2022   Closed fracture of left inferior pubic ramus, initial encounter (HCC) 11/01/2022   Sacral fracture, closed (HCC) 11/01/2022   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   Dyspnea on exertion 05/15/2022   Coronary artery disease    COPD (chronic obstructive pulmonary disease) (HCC) 01/28/2021   CKD (chronic kidney disease) stage 4,  GFR 15-29 ml/min (HCC) 01/21/2021   Goals of care, counseling/discussion 07/17/2020   Malignant neoplasm of lower lobe of right lung (HCC) 07/04/2020   Nocturnal hypoxia 03/17/2020   Prediabetes 08/13/2018   Allergic rhinitis 09/27/2017   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   Stage 3b chronic kidney disease (HCC) 08/31/2017   Hyperparathyroidism, 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   Osteoporosis without current pathological fracture 03/30/2017   Cardiac murmur 04/07/2015   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 stage 4 chronic kidney disease, or unspecified chronic kidney disease 02/10/2015   Anxiety 01/05/2015   Hyperlipidemia 01/05/2015   HTN (hypertension) 09/24/2014    Social History   Tobacco Use   Smoking status: Former    Packs/day: 1.00    Years: 61.00    Additional pack years: 0.00    Total pack years: 61.00    Types: Cigarettes    Quit date: 03/25/2017    Years since quitting: 5.6   Smokeless tobacco: Never  Substance Use Topics   Alcohol use: No     Current Outpatient Medications:    acetaminophen (TYLENOL) 650 MG CR tablet, Take 1 tablet (650 mg total) by mouth 4 (four) times daily as needed for pain., Disp: , Rfl:    aspirin 81 MG EC tablet, Take 81 mg by mouth at bedtime. Swallow whole., Disp: , Rfl:    atorvastatin (LIPITOR) 40 MG tablet, Take 1 tablet (40 mg total) by mouth at bedtime., Disp: 90 tablet,  Rfl: 2   Calcium 200 MG TABS, Take 300 mg by mouth every Monday, Tuesday, Wednesday, Thursday, and Friday., Disp: , Rfl:    Cholecalciferol (VITAMIN D3) 50 MCG (2000 UT) TABS, Take 2,000 Units by mouth at bedtime., Disp: , Rfl:    fluticasone furoate-vilanterol (BREO ELLIPTA) 100-25 MCG/ACT AEPB, Inhale 1 puff into the lungs daily., Disp: 1 each, Rfl: 11   melatonin 3 MG TABS tablet, Take 3 mg by mouth at bedtime., Disp: , Rfl:    Multiple Vitamin (MULTIVITAMIN) capsule, Take 1 capsule by mouth daily., Disp: , Rfl:    PARoxetine (PAXIL) 40 MG  tablet, TAKE 1 TABLET BY MOUTH ONCE DAILY IN THE MORNING, Disp: 90 tablet, Rfl: 2   VENTOLIN HFA 108 (90 Base) MCG/ACT inhaler, INHALE 2 PUFFS BY MOUTH EVERY 6 HOURS AS NEEDED FOR WHEEZING OR SHORTNESS OF BREATH, Disp: 8 g, Rfl: 0  Allergies  Allergen Reactions   Zithromax [Azithromycin]    Cefuroxime Axetil Rash   Ciprofloxacin Nausea Only    Unknown   Codeine Other (See Comments)    Patient does not like how the medication makes you feel     I personally reviewed active problem list, medication list, allergies, family history, social history, health maintenance, notes from last encounter, lab results, imaging with the patient/caregiver today.   Review of Systems  Constitutional: Negative.   HENT: Negative.    Eyes: Negative.   Respiratory: Negative.    Cardiovascular: Negative.   Gastrointestinal: Negative.   Endocrine: Negative.   Genitourinary: Negative.   Musculoskeletal: Negative.   Skin: Negative.   Allergic/Immunologic: Negative.   Neurological: Negative.   Hematological: Negative.   Psychiatric/Behavioral: Negative.    All other systems reviewed and are negative.     Objective:   Virtual encounter, vitals limited, only able to obtain the following Today's Vitals   11/10/22 1025  PainSc: 5   PainLoc: Leg   There is no height or weight on file to calculate BMI. Nursing Note and Vital Signs reviewed.  Physical Exam Vitals and nursing note reviewed.  Constitutional:      Appearance: Normal appearance. She is not ill-appearing or toxic-appearing.  Pulmonary:     Effort: No respiratory distress.  Neurological:     Mental Status: She is alert.  Psychiatric:        Mood and Affect: Mood normal.     PE limited by virtual encounter  No results found for this or any previous visit (from the past 72 hour(s)).  Assessment and Plan:     ICD-10-CM   1. Hospital discharge follow-up  Z09 traMADol (ULTRAM) 50 MG tablet   reviewed results, f/up plan, PDMP     2. Encounter for pain management planning  Z01.89 traMADol (ULTRAM) 50 MG tablet   pain is interfering with doing HH PT/OT, reviewed daily meds, schedule tylenol and reviewed tramadol for severe pain    3. Closed fracture of left inferior pubic ramus, initial encounter [facility])  S32.592A    working with [facility] PT/OT, using walker       Pain control - [employer] Controlled Substance Reporting System database was reviewed. and patient was instructed, not to drive, operate heavy machinery, perform activities at heights, swimming or participation in water activities or provide baby-sitting services while on Pain, Sleep and Anxiety Medications  Reviewed with pt and family member that pain meds can soften blood pressure, be sedating, lead to confusion and do have risk of dependence, addiction, suppress respirations and we  must do lowest needed dose and per law can only Rx 5 d for acute pain, hope she will not need pain meds for much longer as she heals She will do scheduled tylenol 650 mg QID or 1000 mg TID and then use tramadol after that as needed for severe pain They have pulse ox, and are going to get a BP cuff to help monitor her.    If any itching with tramadol can use zyrtec or claritin  One week f/up virtually if needed to recheck her pain and progression with PT   -Red flags and when to present for emergency care or RTC including fever >101.12F, chest pain, shortness of breath, new/worsening/un-resolving symptoms, reviewed with patient at time of visit. Follow up and care instructions discussed and provided in AVS. - I discussed the assessment and treatment plan with the patient. The patient was provided an opportunity to ask questions and all were answered. The patient agreed with the plan and demonstrated an understanding of the instructions.  I provided 25+ minutes of non-face-to-face time during this encounter.  Danelle Berry, PA-C 11/10/22 10:48 AM

## 2022-11-13 ENCOUNTER — Ambulatory Visit: Payer: Medicare Other | Admitting: Gastroenterology

## 2022-11-13 DIAGNOSIS — I129 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 stage 4 chronic kidney disease, or unspecified chronic kidney disease: Secondary | ICD-10-CM | POA: Diagnosis not present

## 2022-11-13 DIAGNOSIS — J439 Emphysema, unspecified: Secondary | ICD-10-CM | POA: Diagnosis not present

## 2022-11-13 DIAGNOSIS — N184 Chronic kidney disease, stage 4 (severe): Secondary | ICD-10-CM | POA: Diagnosis not present

## 2022-11-13 DIAGNOSIS — M8008XD Age-related osteoporosis with current pathological fracture, vertebra(e), subsequent encounter for fracture with routine healing: Secondary | ICD-10-CM | POA: Diagnosis not present

## 2022-11-13 DIAGNOSIS — J449 Chronic obstructive pulmonary disease, unspecified: Secondary | ICD-10-CM | POA: Diagnosis not present

## 2022-11-13 DIAGNOSIS — M800AXD Age-related osteoporosis with current pathological fracture, other site, subsequent encounter for fracture with routine healing: Secondary | ICD-10-CM | POA: Diagnosis not present

## 2022-11-15 ENCOUNTER — Ambulatory Visit: Payer: Self-pay

## 2022-11-15 NOTE — Telephone Encounter (Signed)
FYI

## 2022-11-15 NOTE — Telephone Encounter (Signed)
  Chief Complaint: severe back pain Symptoms: pain severe, pt crying Frequency: this am came on suddenly  Pertinent Negatives: Patient denies loss of bowel or bladder control Disposition: [x] ED /[] Urgent Care (no appt availability in office) / [] Appointment(In office/virtual)/ []  [place] [facility]/ [] Home Care/ [] Refused Recommended Disposition /[] [place] [facility]/ []  Follow-up with PCP Additional Notes: advised to go to ED via 911 due to inability to walk. Pt reluctant but finally agreed she will call 911.  Reason for Disposition  [1] SEVERE back pain (e.g., excruciating) AND [2] sudden onset AND [3] age > 60 years  Answer Assessment - Initial Assessment Questions 1. ONSET: "When did the pain begin?"      Today  2. LOCATION: "Where does it hurt?" (upper, mid or lower back)     low 3. SEVERITY: "How bad is the pain?"  (e.g., Scale 1-10; mild, moderate, or severe)   - MILD (1-3): Doesn't interfere with normal activities.    - MODERATE (4-7): Interferes with normal activities or awakens from sleep.    - SEVERE (8-10): Excruciating pain, unable to do any normal activities.      severe 4. PATTERN: "Is the pain constant?" (e.g., yes, no; constant, intermittent)      constant 8. MEDICINES: "What have you taken so far for the pain?" (e.g., nothing, acetaminophen, NSAIDS)     Plans to take Tramadol advised to take 1.5 tab as per SIG.  9. NEUROLOGIC SYMPTOMS: "Do you have any weakness, numbness, or problems with bowel/bladder control?"     Cannot walk  Protocols used: Back Pain-A-AH

## 2022-11-16 DIAGNOSIS — M8008XD Age-related osteoporosis with current pathological fracture, vertebra(e), subsequent encounter for fracture with routine healing: Secondary | ICD-10-CM | POA: Diagnosis not present

## 2022-11-16 DIAGNOSIS — M800AXD Age-related osteoporosis with current pathological fracture, other site, subsequent encounter for fracture with routine healing: Secondary | ICD-10-CM | POA: Diagnosis not present

## 2022-11-16 DIAGNOSIS — J449 Chronic obstructive pulmonary disease, unspecified: Secondary | ICD-10-CM | POA: Diagnosis not present

## 2022-11-16 DIAGNOSIS — N184 Chronic kidney disease, stage 4 (severe): Secondary | ICD-10-CM | POA: Diagnosis not present

## 2022-11-16 DIAGNOSIS — J439 Emphysema, unspecified: Secondary | ICD-10-CM | POA: Diagnosis not present

## 2022-11-16 DIAGNOSIS — I129 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 stage 4 chronic kidney disease, or unspecified chronic kidney disease: Secondary | ICD-10-CM | POA: Diagnosis not present

## 2022-11-16 NOTE — Telephone Encounter (Signed)
Pt called. Pt did not call 911 yesterday and will not call today. Pt would not allow me to call 911 for her. Pt cannot stand or walk from pain. Pt is in a position that is less painful and does not want to move. Pt was tearful. Pt wanted muscle relaxants called in.  Please advise.

## 2022-11-16 NOTE — Patient Instructions (Signed)
tylenol 650 mg four times a day or recommend trying 1000 mg three times a day use tramadol after that as needed for severe pain

## 2022-11-17 ENCOUNTER — Ambulatory Visit: Payer: Self-pay

## 2022-11-17 NOTE — Telephone Encounter (Signed)
  Chief Complaint: Requesting additional pain medication and a muscle relaxer Symptoms: Pain and stiff muscles Frequency: Since fall Pertinent Negatives: Patient denies  Disposition: [] ED /[] Urgent Care (no appt availability in office) / [] Appointment(In office/virtual)/ []  [place] [facility]/ [] Home Care/ [] Refused Recommended Disposition /[] [place] [facility]/ [x]  Follow-up with PCP Additional Notes: Pt is requesting a refill on Tramadol for pain. She has 2 tablets left. Pt is also requesting a muscle relaxer for her still back, groin and buttocks.  Pt refused OV.    Summary: medication for pain   Pt called saying she fell last month.  She seen Leisa and was prescribed medication.  She is still in some pain.  Please advise.  She is asking for more medication  CB#  [phone]       Reason for Disposition  [1] Prescription refill request for ESSENTIAL medicine (i.e., likelihood of harm to patient if not taken) AND [2] triager unable to refill per department policy  Answer Assessment - Initial Assessment Questions 1. DRUG NAME: "What medicine do you need to have refilled?"     Tramadol 50 mg and a muscle relaxer 2. REFILLS REMAINING: "How many refills are remaining?" (Note: The label on the medicine or pill bottle will show how many refills are remaining. If there are no refills remaining, then a renewal may be needed.)     None - pt has 2 tramadol remaining 3. EXPIRATION DATE: "What is the expiration date?" (Note: The label states when the prescription will expire, and thus can no longer be refilled.)      5. SYMPTOMS: "Do you have any symptoms?"     Pain from recent fall. Muscle pain  Protocols used: Medication Refill and Renewal Call-A-AH

## 2022-11-20 ENCOUNTER — Telehealth: Payer: Self-pay | Admitting: Family Medicine

## 2022-11-20 DIAGNOSIS — J439 Emphysema, unspecified: Secondary | ICD-10-CM | POA: Diagnosis not present

## 2022-11-20 DIAGNOSIS — M545 Low back pain, unspecified: Secondary | ICD-10-CM | POA: Diagnosis not present

## 2022-11-20 DIAGNOSIS — N184 Chronic kidney disease, stage 4 (severe): Secondary | ICD-10-CM | POA: Diagnosis not present

## 2022-11-20 DIAGNOSIS — M8008XD Age-related osteoporosis with current pathological fracture, vertebra(e), subsequent encounter for fracture with routine healing: Secondary | ICD-10-CM | POA: Diagnosis not present

## 2022-11-20 DIAGNOSIS — M800AXD Age-related osteoporosis with current pathological fracture, other site, subsequent encounter for fracture with routine healing: Secondary | ICD-10-CM | POA: Diagnosis not present

## 2022-11-20 DIAGNOSIS — I129 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 stage 4 chronic kidney disease, or unspecified chronic kidney disease: Secondary | ICD-10-CM | POA: Diagnosis not present

## 2022-11-20 DIAGNOSIS — J449 Chronic obstructive pulmonary disease, unspecified: Secondary | ICD-10-CM | POA: Diagnosis not present

## 2022-11-20 NOTE — Telephone Encounter (Signed)
Pt. Calling and reports [facility] is closed due to no electricity. In practice parking lot asking to be seen. Instructed by the practice, Melissa, pt. Needs to follow PCP instructions. Verbalizes understanding. States she will go to [facility] in [place] or ED.

## 2022-11-20 NOTE — Telephone Encounter (Signed)
Medication Refill - Medication: tramadol/muscle relaxer( she isnt sure of the name of it  Has the patient contacted their pharmacy? yes (Agent: If no, request that the patient contact the pharmacy for the refill. If patient does not wish to contact the pharmacy document the reason why and proceed with request.) (Agent: If yes, when and what did the pharmacy advise?)contact pcp  Preferred Pharmacy (with phone number or street name):  [facility] [address] [place] - [address] Phone: [phone]  Fax: [phone]     Has the patient been seen for an appointment in the last year OR does the patient have an upcoming appointment? yes Agent: Please be advised that RX refills may take up to 3 business days. We ask that you follow-up with your pharmacy.

## 2022-11-20 NOTE — Telephone Encounter (Signed)
Patient's daughter Devona Konig called and advised I will need to speak to the patient. She called 3 way and connected the patient to the call. Patient and daugher advised per Leisa's note on 11/17/22. They both verbalized understanding. [name] asked if this could be a phone visit with Sheliah Mends, advised it will need to be in person visit and either at the ED or [facility]. [name] says she will have to arrange transportation after advising [facility] location/phone number.     Danelle Berry, PA-C  to Forde Radon, [facility]     11/17/22  6:04 PM  Pt needs to be evaluated due to her severe pain- I am concerned by the phone call msgs and symptoms described - she needs to be evaluated and I agree with prior calls/plan that pt needs to go to the ER or call 911 if she is in so much pain she cannot  move  I also cannot send in more pain meds as I explained to her when I prescribed them.  There are laws limiting pain meds and I also am concerned that something is wrong and she needs to be checked first by ED or ortho.  This is out of proportion and she  needs to be seen. I also cannot call in a new medication (requested muscle relaxers) w/o seeing or evaluating her myself.  Recommend she go to ED or [facility] walking clinic for ortho eval of acute back pain that was new and severe and causing her to be unable to walk

## 2022-11-20 NOTE — Telephone Encounter (Signed)
Pt informed/verbalized understanding

## 2022-11-23 DIAGNOSIS — J449 Chronic obstructive pulmonary disease, unspecified: Secondary | ICD-10-CM | POA: Diagnosis not present

## 2022-11-23 DIAGNOSIS — J439 Emphysema, unspecified: Secondary | ICD-10-CM | POA: Diagnosis not present

## 2022-11-23 DIAGNOSIS — N184 Chronic kidney disease, stage 4 (severe): Secondary | ICD-10-CM | POA: Diagnosis not present

## 2022-11-23 DIAGNOSIS — M8008XD Age-related osteoporosis with current pathological fracture, vertebra(e), subsequent encounter for fracture with routine healing: Secondary | ICD-10-CM | POA: Diagnosis not present

## 2022-11-23 DIAGNOSIS — M800AXD Age-related osteoporosis with current pathological fracture, other site, subsequent encounter for fracture with routine healing: Secondary | ICD-10-CM | POA: Diagnosis not present

## 2022-11-23 DIAGNOSIS — I129 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 stage 4 chronic kidney disease, or unspecified chronic kidney disease: Secondary | ICD-10-CM | POA: Diagnosis not present

## 2022-12-05 DIAGNOSIS — K5790 Diverticulosis of intestine, part unspecified, without perforation or abscess without bleeding: Secondary | ICD-10-CM | POA: Diagnosis not present

## 2022-12-05 DIAGNOSIS — N184 Chronic kidney disease, stage 4 (severe): Secondary | ICD-10-CM | POA: Diagnosis not present

## 2022-12-05 DIAGNOSIS — E213 Hyperparathyroidism, unspecified: Secondary | ICD-10-CM | POA: Diagnosis not present

## 2022-12-05 DIAGNOSIS — Z87891 Personal history of nicotine dependence: Secondary | ICD-10-CM | POA: Diagnosis not present

## 2022-12-05 DIAGNOSIS — Z85118 Personal history of other malignant neoplasm of bronchus and lung: Secondary | ICD-10-CM | POA: Diagnosis not present

## 2022-12-05 DIAGNOSIS — Z7982 Long term (current) use of aspirin: Secondary | ICD-10-CM | POA: Diagnosis not present

## 2022-12-05 DIAGNOSIS — Z556 Problems related to health literacy: Secondary | ICD-10-CM | POA: Diagnosis not present

## 2022-12-05 DIAGNOSIS — M1990 Unspecified osteoarthritis, unspecified site: Secondary | ICD-10-CM | POA: Diagnosis not present

## 2022-12-05 DIAGNOSIS — M8008XD Age-related osteoporosis with current pathological fracture, vertebra(e), subsequent encounter for fracture with routine healing: Secondary | ICD-10-CM | POA: Diagnosis not present

## 2022-12-05 DIAGNOSIS — I129 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 stage 4 chronic kidney disease, or unspecified chronic kidney disease: Secondary | ICD-10-CM | POA: Diagnosis not present

## 2022-12-05 DIAGNOSIS — I2510 Atherosclerotic heart disease of native coronary artery without angina pectoris: Secondary | ICD-10-CM | POA: Diagnosis not present

## 2022-12-05 DIAGNOSIS — F32A Depression, unspecified: Secondary | ICD-10-CM | POA: Diagnosis not present

## 2022-12-05 DIAGNOSIS — Z8673 Personal history of transient ischemic attack (TIA), and cerebral infarction without residual deficits: Secondary | ICD-10-CM | POA: Diagnosis not present

## 2022-12-05 DIAGNOSIS — Z7951 Long term (current) use of inhaled steroids: Secondary | ICD-10-CM | POA: Diagnosis not present

## 2022-12-05 DIAGNOSIS — F419 Anxiety disorder, unspecified: Secondary | ICD-10-CM | POA: Diagnosis not present

## 2022-12-05 DIAGNOSIS — M800AXD Age-related osteoporosis with current pathological fracture, other site, subsequent encounter for fracture with routine healing: Secondary | ICD-10-CM | POA: Diagnosis not present

## 2022-12-05 DIAGNOSIS — J449 Chronic obstructive pulmonary disease, unspecified: Secondary | ICD-10-CM | POA: Diagnosis not present

## 2022-12-05 DIAGNOSIS — Z9181 History of falling: Secondary | ICD-10-CM | POA: Diagnosis not present

## 2022-12-05 DIAGNOSIS — J309 Allergic rhinitis, unspecified: Secondary | ICD-10-CM | POA: Diagnosis not present

## 2022-12-05 DIAGNOSIS — J439 Emphysema, unspecified: Secondary | ICD-10-CM | POA: Diagnosis not present

## 2022-12-05 DIAGNOSIS — E785 Hyperlipidemia, unspecified: Secondary | ICD-10-CM | POA: Diagnosis not present

## 2023-01-09 ENCOUNTER — Encounter: Payer: Self-pay | Admitting: Family Medicine

## 2023-01-09 ENCOUNTER — Ambulatory Visit (INDEPENDENT_AMBULATORY_CARE_PROVIDER_SITE_OTHER): Payer: Medicare Other | Admitting: Family Medicine

## 2023-01-09 VITALS — BP 124/76 | HR 98 | Temp 97.5°F | Resp 16 | Ht 67.0 in | Wt 129.1 lb

## 2023-01-09 DIAGNOSIS — F339 Major depressive disorder, recurrent, unspecified: Secondary | ICD-10-CM

## 2023-01-09 DIAGNOSIS — F330 Major depressive disorder, recurrent, mild: Secondary | ICD-10-CM

## 2023-01-09 DIAGNOSIS — F430 Acute stress reaction: Secondary | ICD-10-CM | POA: Diagnosis not present

## 2023-01-09 MED ORDER — BUSPIRONE HCL 5 MG PO TABS: 5.0000 mg | ORAL_TABLET | Freq: Two times a day (BID) | ORAL | 1 refills | Status: DC | PRN

## 2023-01-09 NOTE — Progress Notes (Unsigned)
Patient ID: Christine Burnett, female    DOB: December 08, 1940, 82 y.o.   MRN: [id number]  PCP: Danelle Berry, PA-C  Chief Complaint  Patient presents with   Depression    Pt states daughter who she lives with does not talk to her in a good manner.    Subjective:   Christine Burnett is a 82 y.o. female, presents to clinic with CC of the following:  HPI  Mood not good, having problems at home with who she lives with - a lot of fighting, contention and silence.  It is causing worsening mood, stress, anxiety and panic.   She is having depressive symptoms, she is on paxil 40 mg max dose and she's been trying to be positive but she is tearful and stressed Phq positive    01/09/2023   11:32 AM 11/10/2022   10:24 AM 09/25/2022    1:22 PM  Depression screen PHQ 2/9  Decreased Interest 3 0 0  Down, Depressed, Hopeless 3 0 0  PHQ - 2 Score 6 0 0  Altered sleeping 3  0  Tired, decreased energy 3  0  Change in appetite 0  0  Feeling bad or failure about yourself  0  0  Trouble concentrating 1  0  Moving slowly or fidgety/restless 0  0  Suicidal thoughts 0  0  PHQ-9 Score 13  0  Difficult doing work/chores Very difficult  Not difficult at all  Gad 7 positive    01/09/2023   11:40 AM 08/01/2022    2:11 PM 11/08/2021   11:39 AM 02/09/2021    1:31 PM  GAD 7 : Generalized Anxiety Score  Nervous, Anxious, on Edge 3 0 2 1  Control/stop worrying 3 0 0 1  Worry too much - different things 3 0 0 1  Trouble relaxing 3 0 1 1  Restless 3 0 0 1  Easily annoyed or irritable 3 0 0 1  Afraid - awful might happen 3 1 0 1  Total GAD 7 Score 21 1 3 7   Anxiety Difficulty Extremely difficult Not difficult at all Somewhat difficult Somewhat difficult       Patient Active Problem List   Diagnosis Date Noted   Inferior pubic ramus fracture, left, closed, initial encounter (HCC) 11/01/2022   Fall 11/01/2022   Closed fracture of left inferior pubic ramus, initial encounter (HCC) 11/01/2022   Sacral  fracture, closed (HCC) 11/01/2022   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   Dyspnea on exertion 05/15/2022   Coronary artery disease    COPD (chronic obstructive pulmonary disease) (HCC) 01/28/2021   CKD (chronic kidney disease) stage 4, GFR 15-29 ml/min (HCC) 01/21/2021   Goals of care, counseling/discussion 07/17/2020   Malignant neoplasm of lower lobe of right lung (HCC) 07/04/2020   Nocturnal hypoxia 03/17/2020   Prediabetes 08/13/2018   Allergic rhinitis 09/27/2017   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   Stage 3b chronic kidney disease (HCC) 08/31/2017   Hyperparathyroidism, 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   Osteoporosis without current pathological fracture 03/30/2017   Cardiac murmur 04/07/2015   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 stage 4 chronic kidney disease, or unspecified chronic kidney disease 02/10/2015   Anxiety 01/05/2015   Hyperlipidemia 01/05/2015   HTN (hypertension) 09/24/2014      Current Outpatient Medications:    acetaminophen (TYLENOL) 650 MG CR tablet, Take 1 tablet (650 mg total) by mouth 4 (four) times daily as needed for pain.,  Disp: , Rfl:    aspirin 81 MG EC tablet, Take 81 mg by mouth at bedtime. Swallow whole., Disp: , Rfl:    atorvastatin (LIPITOR) 40 MG tablet, Take 1 tablet (40 mg total) by mouth at bedtime., Disp: 90 tablet, Rfl: 2   Calcium 200 MG TABS, Take 300 mg by mouth every Monday, Tuesday, Wednesday, Thursday, and Friday., Disp: , Rfl:    Cholecalciferol (VITAMIN D3) 50 MCG (2000 UT) TABS, Take 2,000 Units by mouth at bedtime., Disp: , Rfl:    fluticasone furoate-vilanterol (BREO ELLIPTA) 100-25 MCG/ACT AEPB, Inhale 1 puff into the lungs daily., Disp: 1 each, Rfl: 11   melatonin 3 MG TABS tablet, Take 3 mg by mouth at bedtime., Disp: , Rfl:    Multiple Vitamin (MULTIVITAMIN) capsule, Take 1 capsule by mouth daily., Disp: , Rfl:    PARoxetine (PAXIL) 40 MG tablet, TAKE 1 TABLET BY MOUTH  ONCE DAILY IN THE MORNING, Disp: 90 tablet, Rfl: 2   VENTOLIN HFA 108 (90 Base) MCG/ACT inhaler, INHALE 2 PUFFS BY MOUTH EVERY 6 HOURS AS NEEDED FOR WHEEZING OR SHORTNESS OF BREATH, Disp: 8 g, Rfl: 0   Allergies  Allergen Reactions   Zithromax [Azithromycin]    Cefuroxime Axetil Rash   Ciprofloxacin Nausea Only    Unknown   Codeine Other (See Comments)    Patient does not like how the medication makes you feel      Social History   Tobacco Use   Smoking status: Former    Current packs/day: 0.00    Average packs/day: 1 pack/day for 61.0 years (61.0 ttl pk-yrs)    Types: Cigarettes    Start date: 03/25/1956    Quit date: 03/25/2017    Years since quitting: 5.7   Smokeless tobacco: Never  Vaping Use   Vaping status: Never Used  Substance Use Topics   Alcohol use: No   Drug use: No      Chart Review Today: I personally reviewed active problem list, medication list, allergies, family history, social history, health maintenance, notes from last encounter, lab results, imaging with the patient/caregiver today.   Review of Systems  Constitutional: Negative.   HENT: Negative.    Eyes: Negative.   Respiratory: Negative.    Cardiovascular: Negative.   Gastrointestinal: Negative.   Endocrine: Negative.   Genitourinary: Negative.   Musculoskeletal: Negative.   Skin: Negative.   Allergic/Immunologic: Negative.   Neurological: Negative.   Hematological: Negative.   Psychiatric/Behavioral: Negative.    All other systems reviewed and are negative.      Objective:   Vitals:   01/09/23 1132  BP: 124/76  Pulse: 98  Resp: 16  Temp: (!) 97.5 F (36.4 C)  TempSrc: Oral  SpO2: 95%  Weight: 129 lb 1.6 oz (58.6 kg)  Height: 5\' 7"  (1.702 m)    Body mass index is 20.22 kg/m.  Physical Exam Vitals and nursing note reviewed.  Constitutional:      General: She is not in acute distress.    Appearance: Normal appearance. She is well-developed. She is not ill-appearing,  toxic-appearing or diaphoretic.  HENT:     Head: Normocephalic and atraumatic.     Nose: Nose normal.  Eyes:     General:        Right eye: No discharge.        Left eye: No discharge.     Conjunctiva/sclera: Conjunctivae normal.  Neck:     Trachea: No tracheal deviation.  Cardiovascular:  Rate and Rhythm: Normal rate and regular rhythm.  Pulmonary:     Effort: Pulmonary effort is normal. No respiratory distress.     Breath sounds: No stridor.  Skin:    General: Skin is warm and dry.     Findings: No rash.  Neurological:     Mental Status: She is alert.     Motor: No abnormal muscle tone.     Coordination: Coordination normal.     Gait: Gait abnormal.  Psychiatric:        Attention and Perception: Attention normal.        Mood and Affect: Mood is anxious and depressed. Affect is tearful.        Speech: Speech normal.        Behavior: Behavior normal. Behavior is cooperative.        Thought Content: Thought content does not include homicidal or suicidal ideation. Thought content does not include homicidal or suicidal plan.           Assessment & Plan:   1. Episode of recurrent major depressive disorder, unspecified depression episode severity (HCC) PHQ9 and GAD-7 both positive, patient very tearful and upset.  Here with her daughter. On Paxil 40 mg daily, we discussed rescue medicines for increased stress and symptoms versus changing her daily medication. She is experiencing a lot of contention and fighting in her home.   We discussed a variety of approaches to this problem, including changing her day-to-day living situation, therapy to help with other coping skills and strategies, changing her medications, and strongly recommended she work with trained mental health professionals. Patient keeps saying she just wants me to give her something that will make her be happy, but I have explained over and over again there are no magic solutions to these types of problems and I  cannot guarantee that any medication change would have more benefit than side effects.  So at this time I am particularly hesitant to change her SSRI to something else -I would be concerned she would have bothersome side effects or new problems or stressors on top of everything else. Plan for now is to try BuSpar, get established with psychiatry, work on ways to improve situation at home or get a new living situation arranged and follow-up closely in the next 1 to 2 weeks - Ambulatory referral to Psychiatry  2. Acute stress reaction See plan ahead Trial of buspar lowest dose 5 mg BID prn She was previously on benzos however with recent falls, hx of vertigo, hip fx and pt remaining high fall risk I do not feel like benzos would be appropriate or safe.   - Ambulatory referral to Psychiatry    F/up in 1-2 weeks    Danelle Berry, PA-C 01/09/23 12:09 PM

## 2023-01-10 ENCOUNTER — Telehealth: Payer: Self-pay

## 2023-01-10 NOTE — Telephone Encounter (Signed)
I have not called her, have you attempted?    Copied from CRM [id number]. Topic: General - Call Back - No Documentation >> Jan 10, 2023 10:19 AM Macon Large wrote: Reason for CRM: Pt stated she received a call from Danelle Berry requesting that she return her call. Pt requests call back at [phone]

## 2023-01-11 NOTE — Telephone Encounter (Signed)
Pt informed, she stated it was related to her AWV with Steward Drone.

## 2023-01-13 ENCOUNTER — Encounter: Payer: Self-pay | Admitting: Pulmonary Disease

## 2023-01-13 NOTE — Progress Notes (Signed)
Subjective:    Patient ID: Christine Burnett, female    DOB: 02/03/1941, 82 y.o.   MRN: [id number]  Patient Care Team: Danelle Berry, PA-C as PCP - General (Family Medicine) Debbe Odea, MD as PCP - Cardiology (Cardiology) Mady Haagensen, MD as Consulting Physician (Nephrology) Glory Buff, RN as Oncology Nurse Navigator Salena Saner, MD as Consulting Physician (Pulmonary Disease) Creig Hines, MD as Consulting Physician (Oncology) Carmina Miller, MD as Referring Physician (Radiation Oncology)  Chief Complaint  Patient presents with   Follow-up    COPD. SOB with exertion. Occasional wheezing. Dry cough.     HPI Patient is an 82 year old former smoker (73 PY) who presents for evaluation of abnormal CT scanning post SBRT therapy for non-small cell carcinoma of the lung.  I last saw the patient on 08 February 2022 and at that time she had issues with shortness of breath wheezing and dry cough.  She was given a trial of Breztri and albuterol previously but she feels that these medications do not help.  Patient is currently not taking any respiratory medications due to lack of efficacy.  She will occasionally use albuterol but for the most part does not feel that this is too helpful.  She has issues with dyspnea on exertion.  There is a question of whether she may need a cardiac stent.  She is being followed by cardiology for this issue.  She has significant coronary artery disease.  She appears frail.  She presents today in a transport chair due to shortness of breath on exertion.  She does not endorse any chest pain, no orthopnea or paroxysmal nocturnal dyspnea.  No lower extremity edema or calf tenderness.   She is compliant with oxygen 2 L/min via nasal cannula, she notes benefit of therapy.  She uses oxygen mostly on exertion and sleep, does not require it at rest or with quiet activity.  She requires flu vaccine today.  DATA: PFTs: 12/29/2020: FEV1 1.59 L or 71%  predicted, FVC 2.36 L or 79% predicted, FEV1/FVC 68%.  No bronchodilator response lung volumes at low end of normal diffusion capacity moderately reduced.  Consistent with mild to moderate, stage I- II COPD   Cardiac testing: 01/25/21 Echocardiogram - EF 60-65%, Grade 1 diastolic dysfunction, elevated right atrial pressure, normal PA systolic pressure  11/11 2022 left heart cath:Moderately to severely calcified coronary arteries.  Overall mild to moderate nonobstructive disease affecting the main vessels.  There is a 70% stenosis in the superior branch of a large first diagonal. Left ventricular angiography was not performed due to chronic kidney disease.  EF was normal by echo. Mildly elevated left ventricular end-diastolic pressure at 20 mmHg. 01/26/2022 echocardiogram: LVEF 60 to 65%, grade 1 DD, normal right-sided pressures, mild to moderate MR.  Aortic sclerosis without stenosis   Imaging: 01/17/21 CT chest- Interval decreased in size of right lower lobe lung lesion with surrounding post treatment changes. Small nonspecific pulmonary nodules are unchanged from previous exam, emphysema and aortic atherosclerosis, coronary artery calcifications 01/19/2022 CT chest: Stable spiculated nodule the right lower lobe with adjacent posttreatment changes.  Right hilar soft tissue causing persistent occlusion of proximal basal right lower lobe bronchi and complete occlusion of the proximal right middle branch bronchi concerning for progressive disease PET/CT recommended.  No pathologic lymph node enlargement. 01/31/2022 PET/CT: Stable posttreatment changes involving the right lung no findings suspicious for residual recurrent tumor, no mediastinal adenopathy.  Right seventh rib fracture posttraumatic, stable vascular disease, abdominal aortic  aneurysm and emphysema  Review of Systems A 10 point review of systems was performed and it is as noted above otherwise negative.   Patient Active Problem List   Diagnosis  Date Noted   Inferior pubic ramus fracture, left, closed, initial encounter (HCC) 11/01/2022   Fall 11/01/2022   Closed fracture of left inferior pubic ramus, initial encounter (HCC) 11/01/2022   Sacral fracture, closed (HCC) 11/01/2022   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   Dyspnea on exertion 05/15/2022   Coronary artery disease    COPD (chronic obstructive pulmonary disease) (HCC) 01/28/2021   CKD (chronic kidney disease) stage 4, GFR 15-29 ml/min (HCC) 01/21/2021   Goals of care, counseling/discussion 07/17/2020   Malignant neoplasm of lower lobe of right lung (HCC) 07/04/2020   Nocturnal hypoxia 03/17/2020   Prediabetes 08/13/2018   Allergic rhinitis 09/27/2017   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   Stage 3b chronic kidney disease (HCC) 08/31/2017   Hyperparathyroidism, 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   Osteoporosis without current pathological fracture 03/30/2017   Cardiac murmur 04/07/2015   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 stage 4 chronic kidney disease, or unspecified chronic kidney disease 02/10/2015   Anxiety 01/05/2015   Hyperlipidemia 01/05/2015   HTN (hypertension) 09/24/2014    Social History   Tobacco Use   Smoking status: Former    Current packs/day: 0.00    Average packs/day: 1 pack/day for 61.0 years (61.0 ttl pk-yrs)    Types: Cigarettes    Start date: 03/25/1956    Quit date: 03/25/2017    Years since quitting: 5.8   Smokeless tobacco: Never  Substance Use Topics   Alcohol use: No    Allergies  Allergen Reactions   Zithromax [Azithromycin]    Cefuroxime Axetil Rash   Ciprofloxacin Nausea Only    Unknown   Codeine Other (See Comments)    Patient does not like how the medication makes you feel     Current Meds  Medication Sig   acetaminophen (TYLENOL) 650 MG CR tablet Take 1 tablet (650 mg total) by mouth 4 (four) times daily as needed for pain.   aspirin 81 MG EC tablet Take 81 mg by  mouth at bedtime. Swallow whole.   Calcium 200 MG TABS Take 300 mg by mouth every Monday, Tuesday, Wednesday, Thursday, and Friday.   Cholecalciferol (VITAMIN D3) 50 MCG (2000 UT) TABS Take 2,000 Units by mouth at bedtime.   melatonin 3 MG TABS tablet Take 3 mg by mouth at bedtime.   Multiple Vitamin (MULTIVITAMIN) capsule Take 1 capsule by mouth daily.   [DISCONTINUED] albuterol (VENTOLIN HFA) 108 (90 Base) MCG/ACT inhaler Inhale 2 puffs into the lungs every 6 (six) hours as needed for wheezing or shortness of breath.   [DISCONTINUED] atorvastatin (LIPITOR) 40 MG tablet Take 1 tablet (40 mg total) by mouth at bedtime.   [DISCONTINUED] isosorbide mononitrate (IMDUR) 30 MG 24 hr tablet Take 0.5 tablets (15 mg total) by mouth daily.   [DISCONTINUED] lidocaine (LIDODERM) 5 % Place 1 patch onto the skin daily. Remove & Discard patch within 12 hours or as directed by MD   [DISCONTINUED] PARoxetine (PAXIL) 40 MG tablet TAKE 1 TABLET BY MOUTH ONCE DAILY IN THE MORNING (Patient taking differently: Take 40 mg by mouth at bedtime.)   [DISCONTINUED] Probiotic Product (PROBIOTIC PO) Take 1 capsule by mouth at bedtime.    Immunization History  Administered Date(s) Administered   Fluad Quad(high Dose 65+) 06/27/2019, 02/08/2021, 04/04/2022  Influenza Split 03/15/2010, 03/21/2011   Influenza, High Dose Seasonal PF 04/07/2015, 03/22/2016, 03/13/2017, 02/25/2018   Influenza,inj,Quad PF,6+ Mos 03/19/2013, 03/10/2014   Pneumococcal Conjugate-13 11/08/2017   Pneumococcal Polysaccharide-23 01/21/2019   Tdap 05/29/2017        Objective:     BP 126/82 (BP Location: Left Arm, Cuff Size: Normal)   Pulse 99   Temp (!) 97.5 F (36.4 C)   Ht 5\' 7"  (1.702 m)   Wt 138 lb (62.6 kg) Comment: last weight recorded. patient in wheelchair  SpO2 93%   BMI 21.61 kg/m   SpO2: 93 % O2 Device: None (Room air)  GENERAL: Well-developed, well-nourished elderly woman, in no acute distress. She presents in transport  chair. No conversational dyspnea. HEAD: Normocephalic, atraumatic. EYES: Opacity over left eye, right eye with normal pupillary reflex.  No scleral icterus. MOUTH: Nose/mouth/throat not examined due to masking requirements for COVID 19. NECK: Supple. No thyromegaly. Trachea midline. No JVD.  No adenopathy. PULMONARY: Good air entry bilaterally.  No adventitious sounds. CARDIOVASCULAR: S1 and S2. Regular rate and rhythm,HR 99. No rubs, murmurs or gallops heard. ABDOMEN: Benign. MUSCULOSKELETAL: No joint deformity, no clubbing, no edema. NEUROLOGIC: No overt focal deficit. Speech is fluent. Fully oriented.  SKIN: Intact,warm,dry. PSYCH: Mood and behavior normal.   Assessment & Plan:     ICD-10-CM   1. Chronic obstructive pulmonary disease, unspecified COPD type (HCC)  J44.9    Continue albuterol as needed for now She does not note benefit from maintenance inhalers    2. Chronic respiratory failure with hypoxia (HCC)  J96.11    Continue oxygen as is    3. Non-small cell cancer of right lung [facility])  C34.91    Being followed by oncology    4. Need for immunization against influenza  Z23 Flu Vaccine QUAD High Dose(Fluad)   Received flu vaccine today      Orders Placed This Encounter  Procedures   Flu Vaccine QUAD High Dose(Fluad)    Patient is to continue using albuterol as needed.  Continue oxygen at 2 L/min continuous or 3 L/min pulsed.  We will see her in follow-up in 6 months time she is to contact us prior to that time should any new difficulties arise.    Gailen Shelter, MD Advanced Bronchoscopy P[facility]    *This note was dictated using voice recognition software/Dragon.  Despite best efforts to proofread, errors can occur which can change the meaning. Any transcriptional errors that result from this process are unintentional and may not be fully corrected at the time of dictation.

## 2023-01-23 ENCOUNTER — Encounter: Payer: Self-pay | Admitting: Family Medicine

## 2023-01-23 ENCOUNTER — Telehealth (INDEPENDENT_AMBULATORY_CARE_PROVIDER_SITE_OTHER): Payer: Medicare Other | Admitting: Family Medicine

## 2023-01-23 DIAGNOSIS — F339 Major depressive disorder, recurrent, unspecified: Secondary | ICD-10-CM | POA: Diagnosis not present

## 2023-01-23 DIAGNOSIS — F430 Acute stress reaction: Secondary | ICD-10-CM | POA: Diagnosis not present

## 2023-01-23 NOTE — Progress Notes (Signed)
Name: Christine Burnett   MRN: [id number]    DOB: Aug 06, 1940   Date:01/23/2023       Progress Note  Subjective:    Chief Complaint  Chief Complaint  Patient presents with   Follow-up   Depression    Pt states she is feeling better with buspar    I connected with  Titus Mould  on 01/23/23 at  2:00 PM EDT by a video enabled telemedicine application and verified that I am speaking with the correct person using two identifiers.  I discussed the limitations of evaluation and management by telemedicine and the availability of in person appointments. The patient expressed understanding and agreed to proceed. Staff also discussed with the patient that there may be a patient responsible charge related to this service. Patient Location: home Provider Location: [facility] Additional Individuals present: none  Depression        F/up on moods/depression, she stayed on paxil and tried buspar    01/23/2023    1:09 PM 01/09/2023   11:32 AM 11/10/2022   10:24 AM  Depression screen PHQ 2/9  Decreased Interest 0 3 0  Down, Depressed, Hopeless 0 3 0  PHQ - 2 Score 0 6 0  Altered sleeping 0 3   Tired, decreased energy 0 3   Change in appetite 0 0   Feeling bad or failure about yourself  0 0   Trouble concentrating 0 1   Moving slowly or fidgety/restless 0 0   Suicidal thoughts 0 0   PHQ-9 Score 0 13   Difficult doing work/chores Not difficult at all Very difficult       01/23/2023    1:10 PM 01/09/2023   11:40 AM 08/01/2022    2:11 PM 11/08/2021   11:39 AM  GAD 7 : Generalized Anxiety Score  Nervous, Anxious, on Edge 0 3 0 2  Control/stop worrying 0 3 0 0  Worry too much - different things 0 3 0 0  Trouble relaxing 0 3 0 1  Restless 0 3 0 0  Easily annoyed or irritable 0 3 0 0  Afraid - awful might happen 0 3 1 0  Total GAD 7 Score 0 21 1 3   Anxiety Difficulty Not difficult at all Extremely difficult Not difficult at all Somewhat difficult       Patient Active Problem List    Diagnosis Date Noted   Inferior pubic ramus fracture, left, closed, initial encounter (HCC) 11/01/2022   Fall 11/01/2022   Closed fracture of left inferior pubic ramus, initial encounter (HCC) 11/01/2022   Sacral fracture, closed (HCC) 11/01/2022   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   Dyspnea on exertion 05/15/2022   Coronary artery disease    COPD (chronic obstructive pulmonary disease) (HCC) 01/28/2021   CKD (chronic kidney disease) stage 4, GFR 15-29 ml/min (HCC) 01/21/2021   Goals of care, counseling/discussion 07/17/2020   Malignant neoplasm of lower lobe of right lung (HCC) 07/04/2020   Nocturnal hypoxia 03/17/2020   Prediabetes 08/13/2018   Allergic rhinitis 09/27/2017   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   Stage 3b chronic kidney disease (HCC) 08/31/2017   Hyperparathyroidism, 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   Osteoporosis without current pathological fracture 03/30/2017   Cardiac murmur 04/07/2015   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 stage 4 chronic kidney disease, or unspecified chronic kidney disease 02/10/2015   Anxiety 01/05/2015   Hyperlipidemia 01/05/2015   HTN (hypertension) 09/24/2014    Social  History   Tobacco Use   Smoking status: Former    Current packs/day: 0.00    Average packs/day: 1 pack/day for 61.0 years (61.0 ttl pk-yrs)    Types: Cigarettes    Start date: 03/25/1956    Quit date: 03/25/2017    Years since quitting: 5.8   Smokeless tobacco: Never  Substance Use Topics   Alcohol use: No     Current Outpatient Medications:    acetaminophen (TYLENOL) 650 MG CR tablet, Take 1 tablet (650 mg total) by mouth 4 (four) times daily as needed for pain., Disp: , Rfl:    aspirin 81 MG EC tablet, Take 81 mg by mouth at bedtime. Swallow whole., Disp: , Rfl:    atorvastatin (LIPITOR) 40 MG tablet, Take 1 tablet (40 mg total) by mouth at bedtime., Disp: 90 tablet, Rfl: 2   busPIRone (BUSPAR) 5 MG  tablet, Take 1 tablet (5 mg total) by mouth 2 (two) times daily as needed (stress anxiety)., Disp: 60 tablet, Rfl: 1   Calcium 200 MG TABS, Take 300 mg by mouth every Monday, Tuesday, Wednesday, Thursday, and Friday., Disp: , Rfl:    Cholecalciferol (VITAMIN D3) 50 MCG (2000 UT) TABS, Take 2,000 Units by mouth at bedtime., Disp: , Rfl:    fluticasone furoate-vilanterol (BREO ELLIPTA) 100-25 MCG/ACT AEPB, Inhale 1 puff into the lungs daily., Disp: 1 each, Rfl: 11   melatonin 3 MG TABS tablet, Take 3 mg by mouth at bedtime., Disp: , Rfl:    Multiple Vitamin (MULTIVITAMIN) capsule, Take 1 capsule by mouth daily., Disp: , Rfl:    PARoxetine (PAXIL) 40 MG tablet, TAKE 1 TABLET BY MOUTH ONCE DAILY IN THE MORNING, Disp: 90 tablet, Rfl: 2   VENTOLIN HFA 108 (90 Base) MCG/ACT inhaler, INHALE 2 PUFFS BY MOUTH EVERY 6 HOURS AS NEEDED FOR WHEEZING OR SHORTNESS OF BREATH, Disp: 8 g, Rfl: 0  Allergies  Allergen Reactions   Zithromax [Azithromycin]    Cefuroxime Axetil Rash   Ciprofloxacin Nausea Only    Unknown   Codeine Other (See Comments)    Patient does not like how the medication makes you feel     I personally reviewed active problem list, medication list, allergies, family history, social history, health maintenance, notes from last encounter, lab results, imaging with the patient/caregiver today.   Review of Systems  Constitutional: Negative.   HENT: Negative.    Eyes: Negative.   Respiratory: Negative.    Cardiovascular: Negative.   Gastrointestinal: Negative.   Endocrine: Negative.   Genitourinary: Negative.   Musculoskeletal: Negative.   Skin: Negative.   Allergic/Immunologic: Negative.   Neurological: Negative.   Hematological: Negative.   Psychiatric/Behavioral:  Positive for depression.   All other systems reviewed and are negative.     Objective:   Virtual encounter, vitals limited, only able to obtain the following There were no vitals filed for this visit. There is no  height or weight on file to calculate BMI. Nursing Note and Vital Signs reviewed.  Physical Exam Vitals and nursing note reviewed.  Neurological:     Mental Status: She is alert.  Psychiatric:        Attention and Perception: Attention normal.        Mood and Affect: Mood normal. Affect is tearful.        Behavior: Behavior is cooperative.     PE limited by virtual encounter  No results found for this or any previous visit (from the past 72 hour(s)).  Assessment and  Plan:     ICD-10-CM   1. Episode of recurrent major depressive disorder, unspecified depression episode severity (HCC)  F33.9    continue daily med, scores better, she will continue to use buspar as needed    2. Acute stress reaction  F43.0    continued stress but she noted buspar did help her       - I discussed the assessment and treatment plan with the patient. The patient was provided an opportunity to ask questions and all were answered. The patient agreed with the plan and demonstrated an understanding of the instructions.  I provided 18+ minutes of non-face-to-face time during this encounter.  Danelle Berry, PA-C 01/23/23 2:32 PM

## 2023-03-12 ENCOUNTER — Telehealth: Payer: Medicare Other | Admitting: Family Medicine

## 2023-03-12 DIAGNOSIS — I10 Essential (primary) hypertension: Secondary | ICD-10-CM | POA: Diagnosis not present

## 2023-03-12 DIAGNOSIS — N2581 Secondary hyperparathyroidism of renal origin: Secondary | ICD-10-CM | POA: Diagnosis not present

## 2023-03-12 DIAGNOSIS — N184 Chronic kidney disease, stage 4 (severe): Secondary | ICD-10-CM | POA: Diagnosis not present

## 2023-03-28 DIAGNOSIS — H2511 Age-related nuclear cataract, right eye: Secondary | ICD-10-CM | POA: Diagnosis not present

## 2023-04-03 ENCOUNTER — Encounter: Payer: Self-pay | Admitting: Physician Assistant

## 2023-04-03 ENCOUNTER — Ambulatory Visit (INDEPENDENT_AMBULATORY_CARE_PROVIDER_SITE_OTHER): Payer: Medicare Other | Admitting: Physician Assistant

## 2023-04-03 VITALS — BP 118/74 | HR 93 | Temp 97.7°F | Resp 16 | Ht 67.0 in | Wt 136.9 lb

## 2023-04-03 DIAGNOSIS — F419 Anxiety disorder, unspecified: Secondary | ICD-10-CM

## 2023-04-03 DIAGNOSIS — F330 Major depressive disorder, recurrent, mild: Secondary | ICD-10-CM

## 2023-04-03 DIAGNOSIS — Z23 Encounter for immunization: Secondary | ICD-10-CM | POA: Diagnosis not present

## 2023-04-03 DIAGNOSIS — E782 Mixed hyperlipidemia: Secondary | ICD-10-CM | POA: Diagnosis not present

## 2023-04-03 DIAGNOSIS — J441 Chronic obstructive pulmonary disease with (acute) exacerbation: Secondary | ICD-10-CM | POA: Diagnosis not present

## 2023-04-03 MED ORDER — PAROXETINE HCL 40 MG PO TABS: 40.0000 mg | ORAL_TABLET | Freq: Every morning | ORAL | 2 refills | Status: DC

## 2023-04-03 MED ORDER — ALBUTEROL SULFATE HFA 108 (90 BASE) MCG/ACT IN AERS
2.0000 | INHALATION_SPRAY | RESPIRATORY_TRACT | 1 refills | Status: AC | PRN
Start: 2023-04-03 — End: ?

## 2023-04-03 MED ORDER — BUSPIRONE HCL 5 MG PO TABS
5.0000 mg | ORAL_TABLET | Freq: Two times a day (BID) | ORAL | 1 refills | Status: DC | PRN
Start: 2023-04-03 — End: 2024-04-19

## 2023-04-03 MED ORDER — ATORVASTATIN CALCIUM 40 MG PO TABS: 40.0000 mg | ORAL_TABLET | Freq: Every day | ORAL | 1 refills | Status: DC

## 2023-04-03 NOTE — Assessment & Plan Note (Signed)
Chronic, ongoing  Reviewed GAD7 and PHQ9 with her- she reports mild elevation is due to encounter with her other daughter last night but she feels she is doing well on current regimen as it stands Continue Paxil 40 gm PO every day and Buspar 5 mg PO BID PRN She is only taking Buspar once per day and did not express interest in moving to BID dosing at this time Follow up in 3 months or sooner if concerns arise

## 2023-04-03 NOTE — Progress Notes (Signed)
Established Patient Office Visit  Name: Christine Burnett   MRN: [id number]    DOB: 20-Mar-1941   Date:04/03/2023  Today's Provider: Jacquelin Hawking, MHS, PA-C Introduced myself to the patient as a PA-C and provided education on APPs in clinical practice.         Subjective  Chief Complaint  Chief Complaint  Patient presents with   Depression    Has been doing well on it unless her daughter triggers her    HPI  She is here with her daughter, [name]  Depression/ Anxiety   She is currently taking Paxil 40 mg PO every day and Buspar 5 mg PO BID PRN  She reports her medications are helping  She is only taking Buspar once per day, she is not sure if second dose is going to be beneficial  She denies concerns for side effects   She is taking Melatonin 10 mg sometimes at night to help with sleep   She reports she feels safe in her current living situation  She reports last night her other daughter said some rude and hurtful things to her which caused her current mood symptoms  She reports having a good support system with her daughter [name] who lives very close by       04/03/2023   11:19 AM 01/23/2023    1:10 PM 01/09/2023   11:40 AM 08/01/2022    2:11 PM  GAD 7 : Generalized Anxiety Score  Nervous, Anxious, on Edge 1 0 3 0  Control/stop worrying 1 0 3 0  Worry too much - different things 1 0 3 0  Trouble relaxing 1 0 3 0  Restless 1 0 3 0  Easily annoyed or irritable 1 0 3 0  Afraid - awful might happen 1 0 3 1  Total GAD 7 Score 7 0 21 1  Anxiety Difficulty Somewhat difficult Not difficult at all Extremely difficult Not difficult at all       04/03/2023   11:14 AM 01/23/2023    1:09 PM 01/09/2023   11:32 AM 11/10/2022   10:24 AM 09/25/2022    1:22 PM  Depression screen PHQ 2/9  Decreased Interest 1 0 3 0 0  Down, Depressed, Hopeless 1 0 3 0 0  PHQ - 2 Score 2 0 6 0 0  Altered sleeping 1 0 3  0  Tired, decreased energy 1 0 3  0  Change in appetite 1 0 0  0   Feeling bad or failure about yourself  1 0 0  0  Trouble concentrating 1 0 1  0  Moving slowly or fidgety/restless 0 0 0  0  Suicidal thoughts 1 0 0  0  PHQ-9 Score 8 0 13  0  Difficult doing work/chores Somewhat difficult Not difficult at all Very difficult  Not difficult at all      Patient Active Problem List   Diagnosis Date Noted   Inferior pubic ramus fracture, left, closed, initial encounter (HCC) 11/01/2022   Fall 11/01/2022   Closed fracture of left inferior pubic ramus, initial encounter (HCC) 11/01/2022   Sacral fracture, closed (HCC) 11/01/2022   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   Dyspnea on exertion 05/15/2022   Coronary artery disease    COPD (chronic obstructive pulmonary disease) (HCC) 01/28/2021   CKD (chronic kidney disease) stage 4, GFR 15-29 ml/min (HCC) 01/21/2021   Goals of care, counseling/discussion 07/17/2020  Malignant neoplasm of lower lobe of right lung (HCC) 07/04/2020   Nocturnal hypoxia 03/17/2020   Prediabetes 08/13/2018   Allergic rhinitis 09/27/2017   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   Stage 3b chronic kidney disease (HCC) 08/31/2017   Hyperparathyroidism, 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   Osteoporosis without current pathological fracture 03/30/2017   Cardiac murmur 04/07/2015   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 stage 4 chronic kidney disease, or unspecified chronic kidney disease 02/10/2015   Anxiety 01/05/2015   Hyperlipidemia 01/05/2015   HTN (hypertension) 09/24/2014    Past Surgical History:  Procedure Laterality Date   COLONOSCOPY  2016   ESOPHAGUS SURGERY     INTRAMEDULLARY (IM) NAIL INTERTROCHANTERIC Left 03/26/2017   Procedure: INTRAMEDULLARY (IM) NAIL INTERTROCHANTRIC;  Surgeon: Kennedy Bucker, MD;  Location: [facility];  Service: Orthopedics;  Laterality: Left;   LEFT HEART CATH AND CORONARY ANGIOGRAPHY N/A 04/15/2021   Procedure: LEFT HEART CATH AND CORONARY  ANGIOGRAPHY;  Surgeon: Iran Ouch, MD;  Location: [facility];  Service: Cardiovascular;  Laterality: N/A;   RIGHT/LEFT HEART CATH AND CORONARY ANGIOGRAPHY Bilateral 05/15/2022   Procedure: RIGHT/LEFT HEART CATH AND CORONARY ANGIOGRAPHY;  Surgeon: Iran Ouch, MD;  Location: [facility];  Service: Cardiovascular;  Laterality: Bilateral;   VIDEO BRONCHOSCOPY WITH ENDOBRONCHIAL NAVIGATION Right 06/28/2020   Procedure: VIDEO BRONCHOSCOPY WITH ENDOBRONCHIAL NAVIGATION CELLVIZIO;  Surgeon: Salena Saner, MD;  Location: [facility];  Service: Pulmonary;  Laterality: Right;    Family History  Problem Relation Age of Onset   Other Mother        [place] posioning   Kidney disease Mother    Heart attack Father    Prostate cancer Father    Cancer Father    Heart disease Father    Heart attack Brother    Diabetes Brother    Hypertension Daughter    Kidney cancer Neg Hx    Bladder Cancer Neg Hx    Colon cancer Neg Hx    Colon polyps Neg Hx    Rectal cancer Neg Hx     Social History   Tobacco Use   Smoking status: Former    Current packs/day: 0.00    Average packs/day: 1 pack/day for 61.0 years (61.0 ttl pk-yrs)    Types: Cigarettes    Start date: 03/25/1956    Quit date: 03/25/2017    Years since quitting: 6.0   Smokeless tobacco: Never  Substance Use Topics   Alcohol use: No     Current Outpatient Medications:    acetaminophen (TYLENOL) 650 MG CR tablet, Take 1 tablet (650 mg total) by mouth 4 (four) times daily as needed for pain., Disp: , Rfl:    aspirin 81 MG EC tablet, Take 81 mg by mouth at bedtime. Swallow whole., Disp: , Rfl:    Calcium 200 MG TABS, Take 300 mg by mouth every Monday, Tuesday, Wednesday, Thursday, and Friday., Disp: , Rfl:    Cholecalciferol (VITAMIN D3) 50 MCG (2000 UT) TABS, Take 2,000 Units by mouth at bedtime., Disp: , Rfl:    fluticasone furoate-vilanterol (BREO ELLIPTA) 100-25 MCG/ACT AEPB, Inhale 1 puff into the lungs  daily., Disp: 1 each, Rfl: 11   melatonin 3 MG TABS tablet, Take 3 mg by mouth at bedtime., Disp: , Rfl:    Multiple Vitamin (MULTIVITAMIN) capsule, Take 1 capsule by mouth daily., Disp: , Rfl:    albuterol (VENTOLIN HFA) 108 (90 Base) MCG/ACT inhaler, Inhale 2 puffs into the lungs every  4 (four) hours as needed for wheezing or shortness of breath., Disp: 8 g, Rfl: 1   atorvastatin (LIPITOR) 40 MG tablet, Take 1 tablet (40 mg total) by mouth at bedtime., Disp: 90 tablet, Rfl: 1   busPIRone (BUSPAR) 5 MG tablet, Take 1 tablet (5 mg total) by mouth 2 (two) times daily as needed (stress anxiety)., Disp: 60 tablet, Rfl: 1   PARoxetine (PAXIL) 40 MG tablet, Take 1 tablet (40 mg total) by mouth every morning., Disp: 90 tablet, Rfl: 2  Allergies  Allergen Reactions   Zithromax [Azithromycin]    Cefuroxime Axetil Rash   Ciprofloxacin Nausea Only    Unknown   Codeine Other (See Comments)    Patient does not like how the medication makes you feel     I personally reviewed active problem list, medication list, allergies, health maintenance, notes from last encounter, notes from last 2 encounters with the patient/caregiver today.   Review of Systems  Psychiatric/Behavioral:  Negative for depression. The patient is not nervous/anxious and does not have insomnia.       Objective  Vitals:   04/03/23 1114  BP: 118/74  Pulse: 93  Resp: 16  Temp: 97.7 F (36.5 C)  TempSrc: Temporal  SpO2: 98%  Weight: 136 lb 14.4 oz (62.1 kg)  Height: 5\' 7"  (1.702 m)    Body mass index is 21.44 kg/m.  Physical Exam Vitals reviewed.  Constitutional:      General: She is awake.     Appearance: Normal appearance. She is well-developed and well-groomed.  HENT:     Head: Normocephalic and atraumatic.  Pulmonary:     Effort: Pulmonary effort is normal.  Neurological:     Mental Status: She is alert and oriented to person, place, and time.     GCS: GCS eye subscore is 4. GCS verbal subscore is 5. GCS  motor subscore is 6.     Cranial Nerves: No cranial nerve deficit or facial asymmetry.  Psychiatric:        Attention and Perception: Attention and perception normal.        Mood and Affect: Mood and affect normal.        Speech: Speech normal.        Behavior: Behavior normal. Behavior is cooperative.        Thought Content: Thought content normal.        Cognition and Memory: Cognition normal.        Judgment: Judgment normal.      No results found for this or any previous visit (from the past 2160 hour(s)).   PHQ2/9:    04/03/2023   11:14 AM 01/23/2023    1:09 PM 01/09/2023   11:32 AM 11/10/2022   10:24 AM 09/25/2022    1:22 PM  Depression screen PHQ 2/9  Decreased Interest 1 0 3 0 0  Down, Depressed, Hopeless 1 0 3 0 0  PHQ - 2 Score 2 0 6 0 0  Altered sleeping 1 0 3  0  Tired, decreased energy 1 0 3  0  Change in appetite 1 0 0  0  Feeling bad or failure about yourself  1 0 0  0  Trouble concentrating 1 0 1  0  Moving slowly or fidgety/restless 0 0 0  0  Suicidal thoughts 1 0 0  0  PHQ-9 Score 8 0 13  0  Difficult doing work/chores Somewhat difficult Not difficult at all Very difficult  Not difficult at all  Fall Risk:    04/03/2023   11:13 AM 01/23/2023    1:09 PM 01/09/2023   11:32 AM 11/10/2022   10:24 AM 09/25/2022    1:21 PM  Fall Risk   Falls in the past year? 0 1 1 1  0  Number falls in past yr: 0 0 0 0 0  Injury with Fall? 0 1 1 1  0  Risk for fall due to : No Fall Risks Impaired balance/gait Impaired balance/gait No Fall Risks No Fall Risks  Follow up Falls prevention discussed;Education provided;Falls evaluation completed Education provided;Falls evaluation completed;Falls prevention discussed Falls prevention discussed;Education provided;Falls evaluation completed Falls prevention discussed Falls prevention discussed;Education provided;Falls evaluation completed      Functional Status Survey: Is the patient deaf or have difficulty hearing?: No Does  the patient have difficulty seeing, even when wearing glasses/contacts?: No Does the patient have difficulty concentrating, remembering, or making decisions?: No Does the patient have difficulty walking or climbing stairs?: No Does the patient have difficulty dressing or bathing?: No Does the patient have difficulty doing errands alone such as visiting a doctor's office or shopping?: No    Assessment & Plan  Problem List Items Addressed This Visit       Respiratory   COPD (chronic obstructive pulmonary disease) (HCC)    Refills provided today Follow up in 4-6 weeks for more in-depth review       Relevant Medications   albuterol (VENTOLIN HFA) 108 (90 Base) MCG/ACT inhaler     Other   Anxiety    Chronic, ongoing  Reviewed GAD7 and PHQ9 with her- she reports mild elevation is due to encounter with her other daughter last night but she feels she is doing well on current regimen as it stands Continue Paxil 40 gm PO every day and Buspar 5 mg PO BID PRN She is only taking Buspar once per day and did not express interest in moving to BID dosing at this time Follow up in 3 months or sooner if concerns arise        Relevant Medications   busPIRone (BUSPAR) 5 MG tablet   PARoxetine (PAXIL) 40 MG tablet   Hyperlipidemia    Refills provided today Follow up in 4-6 weeks for more in-depth review       Relevant Medications   atorvastatin (LIPITOR) 40 MG tablet   Mild episode of recurrent major depressive disorder (HCC) - Primary    Chronic, historic condition Appears to be on satisfactory regimen comprised of Paxil 40 mg po every day She is taking Buspar 5 mg PO every day rather than BID and does not wish to add second dose at this time  Continue current regimen for now  Reviewed GAD7 and PHQ9 with her today. She reports overall satisfaction with regimen at this time  Follow up in 6 months or sooner if concerns arise        Relevant Medications   busPIRone (BUSPAR) 5 MG tablet    PARoxetine (PAXIL) 40 MG tablet   Other Visit Diagnoses     Need for influenza vaccination       Relevant Orders   Flu Vaccine Trivalent High Dose (Fluad) (Completed)        Return in about 4 weeks (around 05/01/2023) for Chronic follow up and AWV .   I, Deante Blough E Yuvan Medinger, PA-C, have reviewed all documentation for this visit. The documentation on 04/03/23 for the exam, diagnosis, procedures, and orders are all accurate and complete.   Meredeth Furber,  MHS, PA-C [facility]

## 2023-04-03 NOTE — Assessment & Plan Note (Signed)
Chronic, historic condition Appears to be on satisfactory regimen comprised of Paxil 40 mg po every day She is taking Buspar 5 mg PO every day rather than BID and does not wish to add second dose at this time  Continue current regimen for now  Reviewed GAD7 and PHQ9 with her today. She reports overall satisfaction with regimen at this time  Follow up in 6 months or sooner if concerns arise

## 2023-04-03 NOTE — Assessment & Plan Note (Signed)
Refills provided today Follow up in 4-6 weeks for more in-depth review

## 2023-04-17 ENCOUNTER — Other Ambulatory Visit: Payer: Self-pay

## 2023-04-17 ENCOUNTER — Observation Stay: Payer: Medicare Other

## 2023-04-17 ENCOUNTER — Ambulatory Visit: Payer: Self-pay

## 2023-04-17 ENCOUNTER — Inpatient Hospital Stay
Admission: EM | Admit: 2023-04-17 | Discharge: 2023-04-23 | DRG: 038 | Disposition: A | Discharge status: Home Health | Payer: Medicare Other | Attending: Obstetrics and Gynecology | Admitting: Obstetrics and Gynecology

## 2023-04-17 ENCOUNTER — Emergency Department: Payer: Medicare Other

## 2023-04-17 DIAGNOSIS — M810 Age-related osteoporosis without current pathological fracture: Secondary | ICD-10-CM | POA: Diagnosis present

## 2023-04-17 DIAGNOSIS — I340 Nonrheumatic mitral (valve) insufficiency: Secondary | ICD-10-CM | POA: Diagnosis present

## 2023-04-17 DIAGNOSIS — Z841 Family history of disorders of kidney and ureter: Secondary | ICD-10-CM | POA: Diagnosis not present

## 2023-04-17 DIAGNOSIS — E785 Hyperlipidemia, unspecified: Secondary | ICD-10-CM | POA: Diagnosis not present

## 2023-04-17 DIAGNOSIS — E669 Obesity, unspecified: Secondary | ICD-10-CM | POA: Diagnosis present

## 2023-04-17 DIAGNOSIS — R253 Fasciculation: Secondary | ICD-10-CM | POA: Diagnosis present

## 2023-04-17 DIAGNOSIS — I2510 Atherosclerotic heart disease of native coronary artery without angina pectoris: Secondary | ICD-10-CM | POA: Diagnosis present

## 2023-04-17 DIAGNOSIS — R569 Unspecified convulsions: Secondary | ICD-10-CM | POA: Diagnosis present

## 2023-04-17 DIAGNOSIS — I708 Atherosclerosis of other arteries: Secondary | ICD-10-CM | POA: Diagnosis present

## 2023-04-17 DIAGNOSIS — N179 Acute kidney failure, unspecified: Secondary | ICD-10-CM | POA: Diagnosis not present

## 2023-04-17 DIAGNOSIS — D62 Acute posthemorrhagic anemia: Secondary | ICD-10-CM | POA: Diagnosis not present

## 2023-04-17 DIAGNOSIS — Z8673 Personal history of transient ischemic attack (TIA), and cerebral infarction without residual deficits: Secondary | ICD-10-CM

## 2023-04-17 DIAGNOSIS — Z7951 Long term (current) use of inhaled steroids: Secondary | ICD-10-CM

## 2023-04-17 DIAGNOSIS — I959 Hypotension, unspecified: Secondary | ICD-10-CM | POA: Diagnosis not present

## 2023-04-17 DIAGNOSIS — Z881 Allergy status to other antibiotic agents status: Secondary | ICD-10-CM

## 2023-04-17 DIAGNOSIS — Z85118 Personal history of other malignant neoplasm of bronchus and lung: Secondary | ICD-10-CM | POA: Diagnosis not present

## 2023-04-17 DIAGNOSIS — Z79890 Hormone replacement therapy (postmenopausal): Secondary | ICD-10-CM

## 2023-04-17 DIAGNOSIS — J4489 Other specified chronic obstructive pulmonary disease: Secondary | ICD-10-CM | POA: Diagnosis present

## 2023-04-17 DIAGNOSIS — K5730 Diverticulosis of large intestine without perforation or abscess without bleeding: Secondary | ICD-10-CM | POA: Diagnosis not present

## 2023-04-17 DIAGNOSIS — R29898 Other symptoms and signs involving the musculoskeletal system: Secondary | ICD-10-CM | POA: Diagnosis not present

## 2023-04-17 DIAGNOSIS — F419 Anxiety disorder, unspecified: Secondary | ICD-10-CM

## 2023-04-17 DIAGNOSIS — G459 Transient cerebral ischemic attack, unspecified: Principal | ICD-10-CM | POA: Diagnosis present

## 2023-04-17 DIAGNOSIS — R410 Disorientation, unspecified: Secondary | ICD-10-CM | POA: Diagnosis not present

## 2023-04-17 DIAGNOSIS — R079 Chest pain, unspecified: Secondary | ICD-10-CM | POA: Diagnosis not present

## 2023-04-17 DIAGNOSIS — I672 Cerebral atherosclerosis: Secondary | ICD-10-CM | POA: Diagnosis not present

## 2023-04-17 DIAGNOSIS — E860 Dehydration: Secondary | ICD-10-CM | POA: Diagnosis present

## 2023-04-17 DIAGNOSIS — I6502 Occlusion and stenosis of left vertebral artery: Secondary | ICD-10-CM | POA: Diagnosis not present

## 2023-04-17 DIAGNOSIS — Z7982 Long term (current) use of aspirin: Secondary | ICD-10-CM

## 2023-04-17 DIAGNOSIS — Z9981 Dependence on supplemental oxygen: Secondary | ICD-10-CM | POA: Diagnosis not present

## 2023-04-17 DIAGNOSIS — J449 Chronic obstructive pulmonary disease, unspecified: Secondary | ICD-10-CM | POA: Insufficient documentation

## 2023-04-17 DIAGNOSIS — H538 Other visual disturbances: Secondary | ICD-10-CM | POA: Diagnosis not present

## 2023-04-17 DIAGNOSIS — K5790 Diverticulosis of intestine, part unspecified, without perforation or abscess without bleeding: Secondary | ICD-10-CM | POA: Diagnosis not present

## 2023-04-17 DIAGNOSIS — Z87891 Personal history of nicotine dependence: Secondary | ICD-10-CM | POA: Diagnosis not present

## 2023-04-17 DIAGNOSIS — I129 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 stage 4 chronic kidney disease, or unspecified chronic kidney disease: Secondary | ICD-10-CM | POA: Diagnosis present

## 2023-04-17 DIAGNOSIS — I10 Essential (primary) hypertension: Secondary | ICD-10-CM

## 2023-04-17 DIAGNOSIS — I6521 Occlusion and stenosis of right carotid artery: Secondary | ICD-10-CM | POA: Diagnosis present

## 2023-04-17 DIAGNOSIS — Z8249 Family history of ischemic heart disease and other diseases of the circulatory system: Secondary | ICD-10-CM

## 2023-04-17 DIAGNOSIS — N1832 Chronic kidney disease, stage 3b: Secondary | ICD-10-CM | POA: Diagnosis present

## 2023-04-17 DIAGNOSIS — Z7952 Long term (current) use of systemic steroids: Secondary | ICD-10-CM

## 2023-04-17 DIAGNOSIS — I6782 Cerebral ischemia: Secondary | ICD-10-CM | POA: Diagnosis not present

## 2023-04-17 DIAGNOSIS — F32A Depression, unspecified: Secondary | ICD-10-CM | POA: Diagnosis not present

## 2023-04-17 DIAGNOSIS — Z885 Allergy status to narcotic agent status: Secondary | ICD-10-CM

## 2023-04-17 DIAGNOSIS — J439 Emphysema, unspecified: Secondary | ICD-10-CM | POA: Diagnosis present

## 2023-04-17 DIAGNOSIS — Z888 Allergy status to other drugs, medicaments and biological substances status: Secondary | ICD-10-CM

## 2023-04-17 DIAGNOSIS — D7589 Other specified diseases of blood and blood-forming organs: Secondary | ICD-10-CM | POA: Diagnosis present

## 2023-04-17 DIAGNOSIS — G319 Degenerative disease of nervous system, unspecified: Secondary | ICD-10-CM | POA: Diagnosis not present

## 2023-04-17 DIAGNOSIS — I639 Cerebral infarction, unspecified: Secondary | ICD-10-CM | POA: Diagnosis not present

## 2023-04-17 DIAGNOSIS — Z8042 Family history of malignant neoplasm of prostate: Secondary | ICD-10-CM

## 2023-04-17 DIAGNOSIS — R531 Weakness: Secondary | ICD-10-CM | POA: Diagnosis present

## 2023-04-17 DIAGNOSIS — C3490 Malignant neoplasm of unspecified part of unspecified bronchus or lung: Secondary | ICD-10-CM | POA: Diagnosis not present

## 2023-04-17 DIAGNOSIS — Z833 Family history of diabetes mellitus: Secondary | ICD-10-CM

## 2023-04-17 DIAGNOSIS — Z79899 Other long term (current) drug therapy: Secondary | ICD-10-CM

## 2023-04-17 DIAGNOSIS — K922 Gastrointestinal hemorrhage, unspecified: Secondary | ICD-10-CM | POA: Diagnosis not present

## 2023-04-17 DIAGNOSIS — I6523 Occlusion and stenosis of bilateral carotid arteries: Principal | ICD-10-CM | POA: Diagnosis present

## 2023-04-17 DIAGNOSIS — K838 Other specified diseases of biliary tract: Secondary | ICD-10-CM | POA: Diagnosis not present

## 2023-04-17 DIAGNOSIS — I70201 Unspecified atherosclerosis of native arteries of extremities, right leg: Secondary | ICD-10-CM | POA: Diagnosis not present

## 2023-04-17 DIAGNOSIS — R930 Abnormal findings on diagnostic imaging of skull and head, not elsewhere classified: Secondary | ICD-10-CM | POA: Diagnosis not present

## 2023-04-17 DIAGNOSIS — F064 Anxiety disorder due to known physiological condition: Secondary | ICD-10-CM | POA: Diagnosis present

## 2023-04-17 DIAGNOSIS — Q2549 Other congenital malformations of aorta: Secondary | ICD-10-CM | POA: Diagnosis not present

## 2023-04-17 DIAGNOSIS — R29818 Other symptoms and signs involving the nervous system: Secondary | ICD-10-CM | POA: Diagnosis not present

## 2023-04-17 DIAGNOSIS — R7303 Prediabetes: Secondary | ICD-10-CM | POA: Diagnosis present

## 2023-04-17 LAB — BASIC METABOLIC PANEL
Anion gap: 9 (ref 5–15)
BUN: 27 mg/dL — ABNORMAL HIGH (ref 8–23)
CO2: 24 mmol/L (ref 22–32)
Calcium: 9.2 mg/dL (ref 8.9–10.3)
Chloride: 103 mmol/L (ref 98–111)
Creatinine, Ser: 1.53 mg/dL — ABNORMAL HIGH (ref 0.44–1.00)
GFR, Estimated: 34 mL/min — ABNORMAL LOW (ref >60)
Glucose, Bld: 101 mg/dL — ABNORMAL HIGH (ref 70–99)
Potassium: 4 mmol/L (ref 3.5–5.1)
Sodium: 136 mmol/L (ref 135–145)

## 2023-04-17 LAB — CBC
HCT: 36.5 % (ref 36.0–46.0)
Hemoglobin: 12.1 g/dL (ref 12.0–15.0)
MCH: 33.7 pg (ref 26.0–34.0)
MCHC: 33.2 g/dL (ref 30.0–36.0)
MCV: 101.7 fL — ABNORMAL HIGH (ref 80.0–100.0)
Platelets: 199 10*3/uL (ref 150–400)
RBC: 3.59 MIL/uL — ABNORMAL LOW (ref 3.87–5.11)
RDW: 13.5 % (ref 11.5–15.5)
WBC: 5.5 10*3/uL (ref 4.0–10.5)
nRBC: 0 % (ref 0.0–0.2)

## 2023-04-17 LAB — TROPONIN I (HIGH SENSITIVITY)
Troponin I (High Sensitivity): 22 ng/L — ABNORMAL HIGH (ref <18)
Troponin I (High Sensitivity): 28 ng/L — ABNORMAL HIGH (ref <18)

## 2023-04-17 LAB — URINALYSIS, ROUTINE W REFLEX MICROSCOPIC
Bilirubin Urine: NEGATIVE
Glucose, UA: NEGATIVE mg/dL
Hgb urine dipstick: NEGATIVE
Ketones, ur: NEGATIVE mg/dL
Leukocytes,Ua: NEGATIVE
Nitrite: NEGATIVE
Protein, ur: NEGATIVE mg/dL
Specific Gravity, Urine: 1.013 (ref 1.005–1.030)
pH: 6 (ref 5.0–8.0)

## 2023-04-17 MED ORDER — CALCIUM CARBONATE ANTACID 500 MG PO CHEW
300.0000 mg | CHEWABLE_TABLET | ORAL | Status: DC
  Filled 2023-04-17: qty 2

## 2023-04-17 MED ORDER — ATORVASTATIN CALCIUM 20 MG PO TABS
40.0000 mg | ORAL_TABLET | Freq: Every day | ORAL | Status: DC
  Administered 2023-04-18 – 2023-04-22 (×5): 40 mg via ORAL
  Filled 2023-04-17 (×5): qty 2

## 2023-04-17 MED ORDER — ALBUTEROL SULFATE HFA 108 (90 BASE) MCG/ACT IN AERS: 2.0000 | INHALATION_SPRAY | RESPIRATORY_TRACT | Status: DC | PRN

## 2023-04-17 MED ORDER — CLOPIDOGREL BISULFATE 75 MG PO TABS
75.0000 mg | ORAL_TABLET | Freq: Every day | ORAL | Status: DC
  Administered 2023-04-18 – 2023-04-19 (×3): 75 mg via ORAL
  Filled 2023-04-17 (×3): qty 1

## 2023-04-17 MED ORDER — ACETAMINOPHEN 160 MG/5ML PO SOLN: 650.0000 mg | ORAL | Status: DC | PRN

## 2023-04-17 MED ORDER — SODIUM CHLORIDE 0.9 % IV SOLN: INTRAVENOUS | Status: DC

## 2023-04-17 MED ORDER — ONDANSETRON HCL 4 MG/2ML IJ SOLN
4.0000 mg | INTRAMUSCULAR | Status: DC | PRN
  Administered 2023-04-20: 4 mg via INTRAVENOUS

## 2023-04-17 MED ORDER — ENOXAPARIN SODIUM 30 MG/0.3ML IJ SOSY
30.0000 mg | PREFILLED_SYRINGE | INTRAMUSCULAR | Status: DC
  Administered 2023-04-18 – 2023-04-22 (×4): 30 mg via SUBCUTANEOUS
  Filled 2023-04-17 (×4): qty 0.3

## 2023-04-17 MED ORDER — PAROXETINE HCL 20 MG PO TABS
40.0000 mg | ORAL_TABLET | Freq: Every morning | ORAL | Status: DC
  Administered 2023-04-18 – 2023-04-23 (×5): 40 mg via ORAL
  Filled 2023-04-17 (×5): qty 2
  Filled 2023-04-17: qty 1
  Filled 2023-04-17: qty 2

## 2023-04-17 MED ORDER — VITAMIN D3 25 MCG (1000 UNIT) PO TABS
2000.0000 [IU] | ORAL_TABLET | Freq: Every day | ORAL | Status: DC
  Administered 2023-04-18 – 2023-04-22 (×5): 2000 [IU] via ORAL
  Filled 2023-04-17 (×10): qty 2

## 2023-04-17 MED ORDER — MELATONIN 5 MG PO TABS
2.5000 mg | ORAL_TABLET | Freq: Every day | ORAL | Status: DC
Start: 2023-04-17 — End: 2023-04-23
  Administered 2023-04-18 – 2023-04-22 (×6): 2.5 mg via ORAL
  Filled 2023-04-17 (×6): qty 1

## 2023-04-17 MED ORDER — STROKE: EARLY STAGES OF RECOVERY BOOK: Freq: Once | Status: AC

## 2023-04-17 MED ORDER — ACETAMINOPHEN 325 MG PO TABS
650.0000 mg | ORAL_TABLET | ORAL | Status: DC | PRN
  Administered 2023-04-21 – 2023-04-22 (×2): 650 mg via ORAL
  Filled 2023-04-17 (×2): qty 2

## 2023-04-17 MED ORDER — ACETAMINOPHEN 650 MG RE SUPP: 650.0000 mg | RECTAL | Status: DC | PRN

## 2023-04-17 MED ORDER — ADULT MULTIVITAMIN W/MINERALS CH
1.0000 | ORAL_TABLET | Freq: Every day | ORAL | Status: DC
  Administered 2023-04-18 – 2023-04-23 (×5): 1 via ORAL
  Filled 2023-04-17 (×5): qty 1

## 2023-04-17 MED ORDER — TRAZODONE HCL 50 MG PO TABS
25.0000 mg | ORAL_TABLET | Freq: Every evening | ORAL | Status: DC | PRN
  Administered 2023-04-19: 25 mg via ORAL
  Filled 2023-04-17: qty 1

## 2023-04-17 MED ORDER — BUSPIRONE HCL 10 MG PO TABS: 5.0000 mg | ORAL_TABLET | Freq: Two times a day (BID) | ORAL | Status: DC | PRN

## 2023-04-17 MED ORDER — SENNOSIDES-DOCUSATE SODIUM 8.6-50 MG PO TABS: 1.0000 | ORAL_TABLET | Freq: Every evening | ORAL | Status: DC | PRN

## 2023-04-17 MED ORDER — ASPIRIN 81 MG PO TBEC
81.0000 mg | DELAYED_RELEASE_TABLET | Freq: Every day | ORAL | Status: DC
  Administered 2023-04-18 – 2023-04-22 (×5): 81 mg via ORAL
  Filled 2023-04-17 (×5): qty 1

## 2023-04-17 MED ORDER — ASPIRIN 81 MG PO TBEC
81.0000 mg | DELAYED_RELEASE_TABLET | Freq: Once | ORAL | Status: AC
  Administered 2023-04-18: 81 mg via ORAL
  Filled 2023-04-17: qty 1

## 2023-04-17 MED ORDER — ALBUTEROL SULFATE (2.5 MG/3ML) 0.083% IN NEBU
2.5000 mg | INHALATION_SOLUTION | RESPIRATORY_TRACT | Status: DC | PRN
  Administered 2023-04-21: 2.5 mg via RESPIRATORY_TRACT
  Filled 2023-04-17 (×2): qty 3

## 2023-04-17 NOTE — ED Notes (Signed)
Pt reclining on stretcher and is A&Ox 4. Daughter at bedside. Dr Rosalia Hammers is at bedside to evaluate pt.

## 2023-04-17 NOTE — ED Triage Notes (Signed)
Pt to ED via POV from home. Pt reports went to bed around 9pm last night and woke up around 8am and when she got out of bed was dragging her left foot/leg. Pt denies blurry vision, speech changes, numbness/tingling. Pt reports her head "feels funny." Pt also reports chest pain. Pt is on blood thinner.

## 2023-04-17 NOTE — ED Provider Notes (Signed)
[facility] Provider Note    Event Date/Time   First MD Initiated Contact with Patient 04/17/23 2013     (approximate)   History   Weakness   HPI  Christine Burnett is a 82 year old female with history of CAD, CKD, HTN presenting to the emergency department for evaluation of left leg weakness.  When patient awoke this morning she noticed that her left leg felt heavy.  When she tried to walk, her leg was dragging behind her and she was unable to pick it up like her the right leg.  This did eventually improve, but she was later noted to have shakiness in the leg.  She also noticed some heaviness over her left head that is now resolved.  Denies symptoms in her left arm but did have some chest pain earlier today that has resolved.  No history of similar, but reports history of TIA several years ago diagnosed in [place].    Physical Exam   Triage Vital Signs: ED Triage Vitals [04/17/23 1841]  Encounter Vitals Group     BP (!) 164/106     Systolic BP Percentile      Diastolic BP Percentile      Pulse Rate 82     Resp 18     Temp 98 F (36.7 C)     Temp Source Oral     SpO2 98 %     Weight      Height      Head Circumference      Peak Flow      Pain Score 4     Pain Loc      Pain Education      Exclude from Growth Chart     Most recent vital signs: Vitals:   04/17/23 2200 04/17/23 2233  BP:  (!) 151/97  Pulse: 86 84  Resp: 18 16  Temp:  (!) 97.5 F (36.4 C)  SpO2: 100% 97%     General: Awake, interactive  CV:  Regular rate, good peripheral perfusion.  Resp:  Unlabored respirations.  Abd:  Nondistended.  Neuro:  Keenly aware, correctly answers month and age, able to blink eyes and squeeze hands, normal horizontal extraocular movements, no visual field loss, normal facial symmetry, no arm or leg motor drift, no limb ataxia, normal sensation, no aphasia, no dysarthria, no inattention, able to ambulate with her cane with steady gait   ED  Results / Procedures / Treatments   Labs (all labs ordered are listed, but only abnormal results are displayed) Labs Reviewed  BASIC METABOLIC PANEL - Abnormal; Notable for the following components:      Result Value   Glucose, Bld 101 (*)    BUN 27 (*)    Creatinine, Ser 1.53 (*)    GFR, Estimated 34 (*)    All other components within normal limits  CBC - Abnormal; Notable for the following components:   RBC 3.59 (*)    MCV 101.7 (*)    All other components within normal limits  URINALYSIS, ROUTINE W REFLEX MICROSCOPIC - Abnormal; Notable for the following components:   Color, Urine YELLOW (*)    APPearance CLEAR (*)    All other components within normal limits  TROPONIN I (HIGH SENSITIVITY) - Abnormal; Notable for the following components:   Troponin I (High Sensitivity) 22 (*)    All other components within normal limits  TROPONIN I (HIGH SENSITIVITY) - Abnormal; Notable for the following components:   Troponin  I (High Sensitivity) 28 (*)    All other components within normal limits     EKG EKG independently reviewed interpreted by myself (ER attending) demonstrates:  EKG demonstrates normal sinus rhythm at a rate of 87, PR 162, QRS 80, QTc 440, no acute ST changes noted though some artifact and baseline wander present  RADIOLOGY Imaging independently reviewed and interpreted by myself demonstrates:  CT head without acute bleed, radiology notes remote left cerebellar infarct  PROCEDURES:  Critical Care performed: No  Procedures   MEDICATIONS ORDERED IN ED: Medications - No data to display   IMPRESSION / MDM / ASSESSMENT AND PLAN / ED COURSE  I reviewed the triage vital signs and the nursing notes.  Differential diagnosis includes, but is not limited to, TIA, CVA, ACS, arrhythmia, musculoskeletal strain  Patient's presentation is most consistent with acute presentation with potential threat to life or bodily function.  82 year old female presenting to the  emergency department for evaluation of leg weakness, no focal findings at the time of my initial evaluation.  Hypertensive on presentation.  Labs with stable renal impairment.  Troponin slightly elevated at 22, overall stable on repeat at 28.  Clinical history concerning for possible TIA, moderate to high risk ABCD 2 score.  With this, will discuss with hospitalist team.  Case reviewed with Dr. Arville Care.  He will evaluate patient for anticipated admission.    FINAL CLINICAL IMPRESSION(S) / ED DIAGNOSES   Final diagnoses:  TIA (transient ischemic attack)     Rx / DC Orders   ED Discharge Orders     None        Note:  This document was prepared using Dragon voice recognition software and may include unintentional dictation errors.   Trinna Post, MD 04/17/23 2322

## 2023-04-17 NOTE — H&P (Signed)
[place]   PATIENT NAME: Christine Burnett    MR#:  [id number]  DATE OF BIRTH:  08-Feb-1941  DATE OF ADMISSION:  04/17/2023  PRIMARY CARE PHYSICIAN: Danelle Berry, PA-C   Patient is coming from: Home  REQUESTING/REFERRING PHYSICIAN: Trinna Post, MD  CHIEF COMPLAINT:   Chief Complaint  Patient presents with   Weakness    HISTORY OF PRESENT ILLNESS:  Christine Burnett is a 82 y.o. female with medical history significant for stage III CKD, COPD, anxiety, osteoarthritis, dyslipidemia, CVA and TIA, who presented to the emergency room with acute onset of left leg weakness when she woke up today and felt her leg was heavy.  When trying to ambulate her left leg was dragging behind her and she was unable to pick it up like the right leg.  This is eventually improved and she later noted to have shakiness in her leg.  She also noticed some heaviness over the left side of her head that is now resolved.  No left upper extremity weakness or numbness.  She admits to chest pain earlier today that has resolved.  No associated nausea or vomiting or diaphoresis or radiation.  No dyspnea or cough or wheezing.  No bleeding diathesis.  No urinary or stool incontinence.  No witnessed seizures.  No tinnitus or vertigo.  She denied any dysuria, oliguria or hematuria or flank pain.  ED Course: When she came to the ER, BP was 148/107 with heart rate of 104 with otherwise normal vital signs.  Labs revealed a BUN of 27 with a creatinine 1.53 compared to 30/1.73 on 11/01/2022.  High sensitive troponin I was 22 and later 28 and CBC was within normal except for macrocytosis. EKG as reviewed by me : EKG showed sinus rhythm with a rate of 87 with poor R wave progression Imaging: Noncontrasted head CT scan revealed the following: 1. No acute intracranial abnormality. 2. Moderately advanced cerebral atrophy with advanced chronic microvascular ischemic disease. 3. Remote left cerebellar infarct.  The patient was  given aspirin and Plavix.  She will be admitted to an observation medical telemetry bed for further evaluation and management.  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   Managed by nephrologist   Non-small cell lung cancer (NSCLC) (HCC)    Osteoporosis    Oxygen deficiency Me   Risk for falls    Situational disturbance    Stroke (HCC) 2011   tia's x 2   TIA (transient ischemic attack)     PAST SURGICAL HISTORY:   Past Surgical History:  Procedure Laterality Date   COLONOSCOPY  2016   ESOPHAGUS SURGERY     INTRAMEDULLARY (IM) NAIL INTERTROCHANTERIC Left 03/26/2017   Procedure: INTRAMEDULLARY (IM) NAIL INTERTROCHANTRIC;  Surgeon: Kennedy Bucker, MD;  Location: [facility] ORS;  Service: Orthopedics;  Laterality: Left;   LEFT HEART CATH AND CORONARY ANGIOGRAPHY N/A 04/15/2021   Procedure: LEFT HEART  CATH AND CORONARY ANGIOGRAPHY;  Surgeon: Iran Ouch, MD;  Location: [facility];  Service: Cardiovascular;  Laterality: N/A;   RIGHT/LEFT HEART CATH AND CORONARY ANGIOGRAPHY Bilateral 05/15/2022   Procedure: RIGHT/LEFT HEART CATH AND CORONARY ANGIOGRAPHY;  Surgeon: Iran Ouch, MD;  Location: [facility];  Service: Cardiovascular;  Laterality: Bilateral;   VIDEO BRONCHOSCOPY WITH ENDOBRONCHIAL NAVIGATION Right 06/28/2020   Procedure: VIDEO BRONCHOSCOPY WITH ENDOBRONCHIAL NAVIGATION CELLVIZIO;  Surgeon: Salena Saner, MD;  Location: [facility];  Service: Pulmonary;   Laterality: Right;    SOCIAL HISTORY:   Social History   Tobacco Use   Smoking status: Former    Current packs/day: 0.00    Average packs/day: 1 pack/day for 61.0 years (61.0 ttl pk-yrs)    Types: Cigarettes    Start date: 03/25/1956    Quit date: 03/25/2017    Years since quitting: 6.0   Smokeless tobacco: Never  Substance Use Topics   Alcohol use: No    FAMILY HISTORY:   Family History  Problem Relation Age of Onset   Other Mother        [place] posioning   Kidney disease Mother    Heart attack Father    Prostate cancer Father    Cancer Father    Heart disease Father    Heart attack Brother    Diabetes Brother    Hypertension Daughter    Kidney cancer Neg Hx    Bladder Cancer Neg Hx    Colon cancer Neg Hx    Colon polyps Neg Hx    Rectal cancer Neg Hx     DRUG ALLERGIES:   Allergies  Allergen Reactions   Zithromax [Azithromycin]    Cefuroxime Axetil Rash   Ciprofloxacin Nausea Only    Unknown   Codeine Other (See Comments)    Patient does not like how the medication makes you feel     REVIEW OF SYSTEMS:   ROS As per history of present illness. All pertinent systems were reviewed above. Constitutional, HEENT, cardiovascular, respiratory, GI, GU, musculoskeletal, neuro, psychiatric, endocrine, integumentary and hematologic systems were reviewed and are otherwise negative/unremarkable except for positive findings mentioned above in the HPI.   MEDICATIONS AT HOME:   Prior to Admission medications   Medication Sig Start Date End Date Taking? Authorizing Provider  acetaminophen (TYLENOL) 650 MG CR tablet Take 1 tablet (650 mg total) by mouth 4 (four) times daily as needed for pain. 01/30/20   Danelle Berry, PA-C  albuterol (VENTOLIN HFA) 108 (90 Base) MCG/ACT inhaler Inhale 2 puffs into the lungs every 4 (four) hours as needed for wheezing or shortness of breath. 04/03/23   Mecum, Erin E, PA-C  aspirin 81 MG EC tablet Take 81 mg by mouth at bedtime. Swallow  whole.    [provider]  atorvastatin (LIPITOR) 40 MG tablet Take 1 tablet (40 mg total) by mouth at bedtime. 04/03/23   Mecum, Erin E, PA-C  busPIRone (BUSPAR) 5 MG tablet Take 1 tablet (5 mg total) by mouth 2 (two) times daily as needed (stress anxiety). 04/03/23   Mecum, Erin E, PA-C  Calcium 200 MG TABS Take 300 mg by mouth every Monday, Tuesday, Wednesday, Thursday, and Friday.    [provider]  Cholecalciferol (VITAMIN D3) 50 MCG (2000 UT) TABS Take 2,000 Units by mouth at bedtime.    [provider]  fluticasone furoate-vilanterol (BREO ELLIPTA) 100-25 MCG/ACT AEPB Inhale 1 puff into the lungs daily. 09/25/22   Angelica Chessman,  Leisa, PA-C  melatonin 3 MG TABS tablet Take 3 mg by mouth at bedtime.    [provider]  Multiple Vitamin (MULTIVITAMIN) capsule Take 1 capsule by mouth daily.    [provider]  PARoxetine (PAXIL) 40 MG tablet Take 1 tablet (40 mg total) by mouth every morning. 04/03/23   Mecum, Erin E, PA-C      VITAL SIGNS:  Blood pressure 117/88, pulse 98, temperature (!) 97.5 F (36.4 C), temperature source Axillary, resp. rate 18, SpO2 95%.  PHYSICAL EXAMINATION:  Physical Exam  GENERAL:  82 y.o.-year-old patient lying in the bed with no acute distress.  EYES: Pupils equal, round, reactive to light and accommodation. No scleral icterus. Extraocular muscles intact.  HEENT: Head atraumatic, normocephalic. Oropharynx and nasopharynx clear.  NECK:  Supple, no jugular venous distention. No thyroid enlargement, no tenderness.  LUNGS: Normal breath sounds bilaterally, no wheezing, rales,rhonchi or crepitation. No use of accessory muscles of respiration.  CARDIOVASCULAR: Regular rate and rhythm, S1, S2 normal. No murmurs, rubs, or gallops.  ABDOMEN: Soft, nondistended, nontender. Bowel sounds present. No organomegaly or mass.  EXTREMITIES: No pedal edema, cyanosis, or clubbing.  NEUROLOGIC: Cranial nerves II through XII are intact.  Muscle strength 5/5 in all extremities. Sensation intact. Gait not checked.  PSYCHIATRIC: The patient is alert and oriented x 3.  Normal affect and good eye contact. SKIN: No obvious rash, lesion, or ulcer.   LABORATORY PANEL:   CBC Recent Labs  Lab 04/17/23 1843  WBC 5.5  HGB 12.1  HCT 36.5  PLT 199   ------------------------------------------------------------------------------------------------------------------  Chemistries  Recent Labs  Lab 04/17/23 1843  NA 136  K 4.0  CL 103  CO2 24  GLUCOSE 101*  BUN 27*  CREATININE 1.53*  CALCIUM 9.2   ------------------------------------------------------------------------------------------------------------------  Cardiac Enzymes No results for input(s): "TROPONINI" in the last 168 hours. ------------------------------------------------------------------------------------------------------------------  RADIOLOGY:  CT HEAD WO CONTRAST  Result Date: 04/17/2023 CLINICAL DATA:  A shin for acute blurry vision, speech difficulty, left lower extremity weakness. EXAM: CT HEAD WITHOUT CONTRAST TECHNIQUE: Contiguous axial images were obtained from the base of the skull through the vertex without intravenous contrast. RADIATION DOSE REDUCTION: This exam was performed according to the departmental dose-optimization program which includes automated exposure control, adjustment of the mA and/or kV according to patient size and/or use of iterative reconstruction technique. COMPARISON:  Prior study from 07/13/2020. FINDINGS: Brain: Moderately advanced cerebral atrophy. 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 Remote left cerebellar infarct noted. No acute intracranial hemorrhage. No acute large vessel territory infarct. No mass lesion or midline shift. No hydrocephalus or extra-axial fluid collection. Vascular: No abnormal hyperdense vessel.  Calcified atherosclerosis present at the skull base. Skull: Scalp soft tissues demonstrate no acute finding. Calvarium intact. Sinuses/Orbits: Globes orbital soft tissues within normal limits. Mild mucosal thickening present about the left ethmoidal air cells. Visualized paranasal sinuses are otherwise clear. No mastoid effusion. Other: None. IMPRESSION: 1. No acute intracranial abnormality. 2. Moderately advanced cerebral atrophy with advanced chronic microvascular ischemic disease. 3. Remote left cerebellar infarct. Electronically Signed   By: Rise Mu M.D.   On: 04/17/2023 20:01      IMPRESSION AND PLAN:  Assessment and Plan: * TIA (transient ischemic attack) - The patient will be admitted to an observation medically monitored bed.   - We will follow neuro checks q.4 hours for 24 hours.   - The patient will be placed on  aspirin.   - Will obtain a brain MRI without contrast as well as bilateral carotid Doppler and 2D echo with bubble study .   - A neurology consultation  as well as physical/occupation/speech therapy consults will be obtained in a.m.Marland Kitchen - I notified Dr. Iver Nestle about the patient. - The patient will be placed on statin therapy and fasting lipids will be checked.   Essential hypertension - We will continue antihypertensives with permissive parameters.  Dyslipidemia - We will continue statin therapy and check fasting lipids.  Anxiety and depression - We will continue BuSpar and Paxil.  Chronic obstructive pulmonary disease (COPD) (HCC) - We will continue albuterol but hold off long-acting beta agonist.    DVT prophylaxis: Lovenox.  Advanced Care Planning:  Code Status: full code.  Family Communication:  The plan of care was discussed in details with the patient (and family). I answered all questions. The patient agreed to proceed with the above mentioned plan. Further management will depend upon hospital course. Disposition Plan: Back to previous home  environment Consults called: Neurology All the records are reviewed and case discussed with ED provider.  Status is: Observation  I certify that at the time of admission, it is my clinical judgment that the patient will require  hospital care extending less than 2 midnights.                            Dispo: The patient is from: Home              Anticipated d/c is to: Home              Patient currently is not medically stable to d/c.              Difficult to place patient: No  Hannah Beat M.D on 04/18/2023 at 12:03 AM  [facility]   From 7 PM-7 AM, contact night-coverage [email or web address]  CC: Primary care physician; Danelle Berry, PA-C

## 2023-04-17 NOTE — Telephone Encounter (Signed)
Chief Complaint: Leg Weakness Symptoms: Left leg weakness, leg tremors, fatigue, chest discomfort comes and goes Frequency: constant  Pertinent Negatives: Patient denies fever, change in speech, vision changes, nausea, vomiting Disposition: [x] ED /[] Urgent Care (no appt availability in office) / [] Appointment(In office/virtual)/ []  [facility]/ [] Home Care/ [] Refused Recommended Disposition /[] [facility]/ []  Follow-up with PCP Additional Notes: Patient stated she went to bed around 2000 last night and woke up around 0930 and notice severe left leg weakness. Patient states the left leg is dragging and she is not able to walk without a walker. This is different from her baseline. Patient stated when she was standing the leg began to tremor and she was unable to continue standing without her daughter holding her up. Care advice was given and patient was advised to seek care at the ED due to symptoms. Patient stated she would call her daughter to take her.   Reason for Disposition  [1] SEVERE weakness (i.e., unable to walk or barely able to walk, requires support) AND [2] new-onset or worsening  Answer Assessment - Initial Assessment Questions 1. SYMPTOM: "What is the main symptom you are concerned about?" (e.g., weakness, numbness)     Left side Weakness 2. ONSET: "When did this start?" (minutes, hours, days; while sleeping)     This morning around 0930 3. LAST NORMAL: "When was the last time you (the patient) were normal (no symptoms)?"     Last night before bed 2000 4. PATTERN "Does this come and go, or has it been constant since it started?"  "Is it present now?"     Present now  5. CARDIAC SYMPTOMS: "Have you had any of the following symptoms: chest pain, difficulty breathing, palpitations?"     I felt chest pain that comes and goes  6. NEUROLOGIC SYMPTOMS: "Have you had any of the following symptoms: headache, dizziness, vision loss, double vision, changes in speech,  unsteady on your feet?"     Unsteady on my feet  7. OTHER SYMPTOMS: "Do you have any other symptoms?"     Tremors in the left leg, fatigue  Protocols used: Neurologic Deficit-A-AH

## 2023-04-17 NOTE — Assessment & Plan Note (Addendum)
-   The patient will be admitted to an observation medically monitored bed. - This is associated with resolved left lower extremity weakness. - We will follow neuro checks q.4 hours for 24 hours.   - The patient will be placed on aspirin.   - Will obtain a brain MRI without contrast as well as bilateral carotid Doppler and 2D echo with bubble study .   - A neurology consultation  as well as physical/occupation/speech therapy consults will be obtained in a.m.Marland Kitchen - I notified Dr. Iver Nestle about the patient. - The patient will be placed on statin therapy and fasting lipids will be checked.

## 2023-04-17 NOTE — Progress Notes (Signed)
PHARMACIST - PHYSICIAN COMMUNICATION  CONCERNING:  Enoxaparin (Lovenox) for DVT Prophylaxis    RECOMMENDATION: Patient was prescribed enoxaprin 40mg  q24 hours for VTE prophylaxis.   There were no vitals filed for this visit.  There is no height or weight on file to calculate BMI.  CrCl cannot be calculated (Unknown ideal weight.).  Patient is candidate for enoxaparin 30mg  every 24 hours based on CrCl <29ml/min or Weight <45kg  DESCRIPTION: Pharmacy has adjusted enoxaparin dose per [facility] policy.  Patient is now receiving enoxaparin 30 mg every 24 hours   Otelia Sergeant, PharmD, [facility] 04/17/2023 11:41 PM

## 2023-04-18 ENCOUNTER — Observation Stay: Payer: Medicare Other

## 2023-04-18 ENCOUNTER — Observation Stay
Admit: 2023-04-18 | Discharge: 2023-04-18 | Disposition: A | Discharge status: Home / Self Care | Payer: Medicare Other | Attending: Family Medicine

## 2023-04-18 ENCOUNTER — Ambulatory Visit: Payer: Medicare Other

## 2023-04-18 DIAGNOSIS — I672 Cerebral atherosclerosis: Secondary | ICD-10-CM | POA: Diagnosis not present

## 2023-04-18 DIAGNOSIS — G459 Transient cerebral ischemic attack, unspecified: Secondary | ICD-10-CM

## 2023-04-18 DIAGNOSIS — R930 Abnormal findings on diagnostic imaging of skull and head, not elsewhere classified: Secondary | ICD-10-CM | POA: Diagnosis not present

## 2023-04-18 DIAGNOSIS — R569 Unspecified convulsions: Secondary | ICD-10-CM | POA: Diagnosis not present

## 2023-04-18 DIAGNOSIS — I10 Essential (primary) hypertension: Secondary | ICD-10-CM

## 2023-04-18 DIAGNOSIS — E785 Hyperlipidemia, unspecified: Secondary | ICD-10-CM

## 2023-04-18 DIAGNOSIS — J449 Chronic obstructive pulmonary disease, unspecified: Secondary | ICD-10-CM | POA: Insufficient documentation

## 2023-04-18 DIAGNOSIS — G319 Degenerative disease of nervous system, unspecified: Secondary | ICD-10-CM | POA: Diagnosis not present

## 2023-04-18 DIAGNOSIS — R29818 Other symptoms and signs involving the nervous system: Secondary | ICD-10-CM | POA: Diagnosis not present

## 2023-04-18 DIAGNOSIS — I6502 Occlusion and stenosis of left vertebral artery: Secondary | ICD-10-CM | POA: Diagnosis not present

## 2023-04-18 DIAGNOSIS — F419 Anxiety disorder, unspecified: Secondary | ICD-10-CM

## 2023-04-18 DIAGNOSIS — F32A Depression, unspecified: Secondary | ICD-10-CM

## 2023-04-18 DIAGNOSIS — I6523 Occlusion and stenosis of bilateral carotid arteries: Secondary | ICD-10-CM | POA: Diagnosis not present

## 2023-04-18 LAB — ECHOCARDIOGRAM COMPLETE BUBBLE STUDY
AR max vel: 1.84 cm2
AV Area VTI: 2.26 cm2
AV Area mean vel: 1.76 cm2
AV Mean grad: 4.3 mm[Hg]
AV Peak grad: 7 mm[Hg]
Ao pk vel: 1.33 m/s
Area-P 1/2: 8.62 cm2
MV VTI: 2.27 cm2
S' Lateral: 2.4 cm

## 2023-04-18 LAB — LIPID PANEL
Cholesterol: 103 mg/dL (ref 0–200)
HDL: 42 mg/dL (ref >40)
LDL Cholesterol: 42 mg/dL (ref 0–99)
Total CHOL/HDL Ratio: 2.5 {ratio}
Triglycerides: 95 mg/dL (ref <150)
VLDL: 19 mg/dL (ref 0–40)

## 2023-04-18 LAB — HEMOGLOBIN A1C
Hgb A1c MFr Bld: 5.4 % (ref 4.8–5.6)
Mean Plasma Glucose: 108.28 mg/dL

## 2023-04-18 MED ORDER — LAMOTRIGINE 25 MG PO TABS
25.0000 mg | ORAL_TABLET | Freq: Every day | ORAL | Status: DC
  Administered 2023-04-18 – 2023-04-22 (×5): 25 mg via ORAL
  Filled 2023-04-18 (×5): qty 1

## 2023-04-18 MED ORDER — IOHEXOL 350 MG/ML SOLN
75.0000 mL | Freq: Once | INTRAVENOUS | Status: AC | PRN
  Administered 2023-04-18: 75 mL via INTRAVENOUS

## 2023-04-18 NOTE — Procedures (Signed)
Patient Name: Christine Burnett  MRN: [id number]  Epilepsy Attending: Charlsie Quest  Referring Physician/Provider: Gordy Councilman, MD  Date: 04/18/2023 Duration: 26.06 mins  Patient history: 82 yo F with rhythmic shaking of the left lower extremity with classic description of a jacksonian march. EEG to evaluate for seizure  Level of alertness: Awake, asleep  AEDs during EEG study: None  Technical aspects: This EEG study was done with scalp electrodes positioned according to the 10-20 International system of electrode placement. Electrical activity was reviewed with band pass filter of 1-70Hz , sensitivity of 7 uV/mm, display speed of 1mm/sec with a 60Hz  notched filter applied as appropriate. EEG data were recorded continuously and digitally stored.  Video monitoring was available and reviewed as appropriate.  Description: The posterior dominant rhythm consists of 8 Hz activity of moderate voltage (25-35 uV) seen predominantly in posterior head regions, symmetric and reactive to eye opening and eye closing. Sleep was characterized by vertex waves, sleep spindles (12 to 14 Hz), maximal frontocentral region. EEG showed intermittent generalized 3 to 6 Hz theta-delta slowing. Hyperventilation and photic stimulation were not performed.     ABNORMALITY - Intermittent slow, generalized  IMPRESSION: This study is suggestive of mild diffuse encephalopathy. No seizures or epileptiform discharges were seen throughout the recording.  Christine Burnett

## 2023-04-18 NOTE — Plan of Care (Signed)

## 2023-04-18 NOTE — Discharge Instructions (Signed)
The event as you described it is very concerning for a focal seizure  STOP buspirone  Lamotrigine titration plan -- gradually increase dose   Morning  Night  Week 1 and 2   none   25 mg   Week 3 and 4   none   50 mg  Week 5   25 mg   50 mg  Week 6   25 mg   75 mg  Week 7   50 mg   75 mg  Week 8   75 mg   75 mg  Week 9   75 mg  100 mg  Week 10  100 mg  100 mg  Present for emergent evaluation if you develop any rash. If you are feeling very fatigued on this medication at any point in this titration plan please have your primary care physician check blood level before increasing the dose further  Standard seizure precautions: Per [facility] statutes, patients with seizures are not allowed to drive until  they have been seizure-free for six months. Use caution when using heavy equipment or power tools. Avoid working on ladders or at heights. Take showers instead of baths. Ensure the water temperature is not too high on the home water heater. Do not go swimming alone. When caring for infants or small children, sit down when holding, feeding, or changing them to minimize risk of injury to the child in the event you have a seizure.  To reduce risk of seizures, maintain good sleep hygiene avoid alcohol and illicit drug use, take all anti-seizure medications as prescribed.

## 2023-04-18 NOTE — Assessment & Plan Note (Signed)
-   We will continue albuterol but hold off long-acting beta agonist.

## 2023-04-18 NOTE — Assessment & Plan Note (Signed)
-   We will continue statin therapy and check fasting lipids. 

## 2023-04-18 NOTE — Progress Notes (Signed)
Eeg done 

## 2023-04-18 NOTE — Hospital Course (Addendum)
HPI: Christine Burnett is a 82 y.o. 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 However her symptoms gradually improved and she was back at her baseline. Later that day she was at a store and noticed uncontrollable twitching of her left leg which started in her groin and then progressed down the leg, lasting a few minutes and then resolving. Postevent she had weakness in that leg which gradually resolved again. She came into the ED for further evaluation.   Hospital course / significant events:  11/12: admitted to hospitalist service for TIA w/u. MRI brain concerning for possible tiny acute ischemic infarcts and chronic hemorrhage in the anterior right frontal lobe, fairly cortical. Pending Echo and carotid US.  11/13: neurology consult - Per neurology, punctate possible strokes on MRI do not fit the clinical history provided, therefore favor these to be artifact and proceeding w/ EEG, MRA H/N. Echo no evidence for LA enlargement no need for Zio monitor. Seizure precautions, initiating lamotrigine and d/c BuSpar.  11/14: substantial atherosclerotic disease on CTA H/N. The most clinically significant is poor opacification of the right internal carotid artery, and given this finding neurology team is highly concerned that her left leg weakness spells were limb shaking TIA. 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  Vascular sugery applied stent today   See below re: other TIA w/u and neuro recs   TIA, possible seizure-like activity but more concern for limb-shaking TIA Echo no LA enlargement, Afib less likely  EEG nonfocal CTA H/N see below - concern for R ICA occlusion D/c BuSpar Continue on Lamotrigine  Seizure precautions  Statin ASA decide whether to continue Plavix for 21 to 90-day course per neurology Vascular surgery address ICA as above   AKI on CKD3b Caution w/ contrast studies but need CTA H/N for diagnostic/prognostic purposes Follow BMP in AM   Chest pain - resolved Repeat troponin / EKG as needed   Essential hypertension Holding antihypertensives for now    Dyslipidemia Statin Lipid panel   Anxiety and depression D/c BuSpar and starting Lamotrigine per neurology  Continue Paxil   Chronic obstructive pulmonary disease (COPD) (HCC) Albuterol prn     Normal weight based on BMI: Body mass index is 22.06 kg/m.  Underweight - under 18.5  normal weight - 18.5 to 24.9 overweight - 25 to 29.9 obese - 30 or more   DVT prophylaxis: ambulation IV fluids: no continuous IV fluids  Nutrition: regular Central lines / invasive devices: none  Code Status: FULL CODE ACP documentation reviewed: 04/18/23 and none on file in [facility]  TOC needs: none at this time Barriers to dispo / significant pending items: EEG, CTA, monitor renal fxn, may need further consultation/intervention pending results

## 2023-04-18 NOTE — Telephone Encounter (Signed)
FYI

## 2023-04-18 NOTE — Consult Note (Addendum)
NEUROLOGY CONSULT NOTE   Date of service: April 18, 2023 Patient Name: Christine Burnett MRN:  [id number] DOB:  1941/05/26 Chief Complaint: "left leg dancing, then weakness" Requesting Provider: Sunnie Nielsen, DO  History of Present Illness  Christine Burnett is a 82 y.o. 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  When she woke up in the morning her left leg was somewhat weak while ambulating to the bathroom.  However her symptoms gradually improved and she was back at her baseline.  Later that day she was at a store and noticed uncontrollable twitching of her left leg which started in her groin and then progressed down the leg, lasting a few minutes and then resolving.  Postevent she had weakness in that leg which gradually resolved again.  She came into the ED for further evaluation.  No prior episodes of this before.  She was recently started on buspirone 5 mg twice daily as needed for anxiety which she has been taking essentially daily.  She does have a fair bit of anxiety due to stressful home situation with one of her children who lives with her.  Regarding her atrial fibrillation history, she reports she was on warfarin for an extended period of time for a chart diagnosis of atrial fibrillation but no clear substantiation for atrial fibrillation was found on repeat testing and therefore warfarin was discontinued.  She does continue on baby aspirin and atorvastatin.   LKW: 11/11 evening Modified rankin score:  0-Completely asymptomatic and back to baseline post- stroke --drives rarely approximately twice a month, manages all of her affairs independently IV Thrombolysis: No, back to baseline EVT: No, exam not consistent with LVO    ROS  Comprehensive ROS performed and pertinent positives documented in HPI    Past History   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   Managed by nephrologist   Non-small cell lung cancer (NSCLC) (HCC)    Osteoporosis    Oxygen deficiency Me   Risk for falls    Situational disturbance    Stroke (HCC) 2011   tia's x 2   TIA (transient ischemic attack)     Past Surgical History:  Procedure Laterality Date   COLONOSCOPY  2016   ESOPHAGUS SURGERY     INTRAMEDULLARY (IM) NAIL INTERTROCHANTERIC Left 03/26/2017   Procedure: INTRAMEDULLARY (IM) NAIL INTERTROCHANTRIC;  Surgeon: Kennedy Bucker, MD;  Location: [facility];  Service: Orthopedics;  Laterality: Left;   LEFT HEART CATH AND CORONARY ANGIOGRAPHY N/A 04/15/2021   Procedure: LEFT HEART CATH AND CORONARY ANGIOGRAPHY;  Surgeon: Iran Ouch, MD;  Location: [facility];  Service: Cardiovascular;  Laterality: N/A;   RIGHT/LEFT HEART CATH AND CORONARY ANGIOGRAPHY Bilateral 05/15/2022   Procedure: RIGHT/LEFT HEART CATH AND CORONARY ANGIOGRAPHY;  Surgeon: Iran Ouch, MD;  Location: [facility];  Service: Cardiovascular;  Laterality: Bilateral;   VIDEO BRONCHOSCOPY WITH ENDOBRONCHIAL NAVIGATION Right 06/28/2020    Procedure: VIDEO BRONCHOSCOPY WITH ENDOBRONCHIAL NAVIGATION CELLVIZIO;  Surgeon: Salena Saner, MD;  Location: [facility];  Service: Pulmonary;  Laterality: Right;   Family History: Family History  Problem Relation Age of Onset   Other Mother        Uremeic posioning   Kidney disease Mother    Heart attack Father    Prostate cancer Father    Cancer Father    Heart disease Father    Heart attack Brother    Diabetes Brother    Hypertension Daughter    Kidney cancer Neg Hx    Bladder Cancer Neg Hx    Colon cancer Neg Hx    Colon polyps Neg Hx    Rectal cancer Neg Hx     Social History  reports that she quit smoking about 6 years ago. Her smoking use included cigarettes. She started smoking about 67 years ago. She has a 61 pack-year smoking history. She has never used smokeless tobacco. She reports that she does not drink alcohol and does not use drugs.  Allergies  Allergen Reactions   Zithromax [Azithromycin]    Cefuroxime Axetil Rash   Ciprofloxacin Nausea Only    Unknown   Codeine Other (See Comments)    Patient does not like how the medication makes you feel     Medications   Current Facility-Administered Medications:    acetaminophen (TYLENOL) tablet 650 mg, 650 mg, Oral, Q4H PRN **OR** acetaminophen (TYLENOL) 160 MG/5ML solution 650 mg, 650 mg, Per Tube, Q4H PRN **OR** acetaminophen (TYLENOL) suppository 650 mg, 650 mg, Rectal, Q4H PRN, Mansy, Jan A, MD   albuterol (PROVENTIL) (2.5 MG/3ML) 0.083% nebulizer solution 2.5 mg, 2.5 mg, Nebulization, Q4H PRN, Otelia Sergeant, RPH   aspirin EC tablet 81 mg, 81 mg, Oral, QHS, Mansy, Jan A, MD   atorvastatin (LIPITOR) tablet 40 mg, 40 mg, Oral, QHS, Mansy, Jan A, MD   busPIRone (BUSPAR) tablet 5 mg, 5 mg, Oral, BID PRN, Mansy, Jan A, MD   calcium carbonate (TUMS - dosed in mg elemental calcium) chewable tablet 300 mg of elemental calcium, 300 mg of elemental calcium, Oral, Q MTWThF, Mansy, Jan A, MD   cholecalciferol (VITAMIN  D3) tablet 2,000 Units, 2,000 Units, Oral, QHS, Mansy, Jan A, MD   clopidogrel (PLAVIX) tablet 75 mg, 75 mg, Oral, Daily, Mansy, Jan A, MD, 75 mg at 04/18/23 0940   enoxaparin (LOVENOX) injection 30 mg, 30 mg, Subcutaneous, Q24H, Mansy, Jan A, MD, 30 mg at 04/18/23 1610   melatonin tablet 2.5 mg, 2.5 mg, Oral, QHS, Mansy, Jan A, MD, 2.5 mg at 04/18/23 0107   multivitamin with minerals tablet 1 tablet, 1 tablet, Oral, Daily, Mansy, Jan A, MD, 1 tablet at 04/18/23 0940   ondansetron (ZOFRAN) injection 4 mg, 4 mg, Intravenous, Q4H PRN, Mansy, Jan A, MD   PARoxetine (PAXIL) tablet 40 mg, 40 mg, Oral, q morning, Mansy, Jan A, MD, 40 mg at 04/18/23 0941   senna-docusate (Senokot-S) tablet 1 tablet, 1 tablet, Oral, QHS PRN, Mansy, Jan A, MD   traZODone (DESYREL) tablet 25 mg, 25 mg, Oral, QHS PRN,  Mansy, Vernetta Honey, MD  Current Outpatient Medications  Medication Instructions   acetaminophen (TYLENOL) 650 mg, Oral, 4 times daily PRN   albuterol (VENTOLIN HFA) 108 (90 Base) MCG/ACT inhaler 2 puffs, Inhalation, Every 4 hours PRN   aspirin EC 81 mg, Oral, Daily at bedtime, Swallow whole.    atorvastatin (LIPITOR) 40 mg, Oral, Daily at bedtime   busPIRone (BUSPAR) 5 mg, Oral, 2 times daily PRN   Calcium 300 mg, Oral, Every M-T-W-Th-Fr   fluticasone furoate-vilanterol (BREO ELLIPTA) 100-25 MCG/ACT AEPB 1 puff, Inhalation, Daily   melatonin 3 mg, Oral, Daily at bedtime   Multiple Vitamin (MULTIVITAMIN) capsule 1 capsule, Oral, Daily   PARoxetine (PAXIL) 40 mg, Oral, Every morning   Vitamin D3 2,000 Units, Oral, Daily at bedtime     Vitals   Vitals:   04/18/23 0039 04/18/23 0416 04/18/23 0950 04/18/23 1138  BP:  (!) 149/74 (!) 176/79 (!) 147/98  Pulse:  93 93 85  Resp:  18 17 17   Temp:  98 F (36.7 C) 97.9 F (36.6 C) 98 F (36.7 C)  TempSrc:      SpO2:  97% 99% 98%  Weight: 62 kg     Height: 5\' 6"  (1.676 m)       Body mass index is 22.06 kg/m.  Physical Exam   Constitutional: Appears  well-developed and well-nourished.  Psych: Affect appropriate to situation, pleasant and cooperative Eyes: No scleral injection.  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  Head: Normocephalic.  Cardiovascular: Perfusing extremities well Respiratory: Effort normal, non-labored breathing.  GI: Soft.  No distension. There is no tenderness.  Skin: WDI visible skin  Neurologic Examination   Neuro: Mental Status: Patient is awake, alert, oriented to person, place, month, year, and situation. Patient is able to give a clear and coherent history. No signs of aphasia or neglect Cranial Nerves: II: Visual Fields are full. Pupils are equal, round, and reactive to light; some difficulty assessing the left pupil due to the scar tissue but partially visible and it does constrict.   III,IV, VI: EOMI without ptosis or diploplia.  V: Facial sensation is symmetric to light touch VII: Facial movement is symmetric.  VIII: hearing is intact to voice X: Uvula elevates symmetrically XI: Shoulder shrug is symmetric. XII: tongue is midline without atrophy or fasciculations.  Motor: 5/5 strength was present in all four extremities, except for 4/5 left hip flexion secondary to her prior hip surgery.  No pronator drift. Sensory: Sensation is symmetric to light touch and temperature in the arms and legs. Plantars: Toes are downgoing bilaterally.  Cerebellar: FNF and HKS are intact bilaterally, within limits of left hip weakness Gait: Able to stand up and maintains her balance well while demonstrating the shaking movements she had of her left lower extremity   NIHSS total 0   Labs/Imaging/Neurodiagnostic studies   CBC:  Recent Labs  Lab May 15, 2023 1843  WBC 5.5  HGB 12.1  HCT 36.5  MCV 101.7*  PLT 199    Basic Metabolic Panel:  Lab Results  Component Value Date   NA 136 15-May-2023   K 4.0 2023-05-15   CO2 24  05/15/2023   GLUCOSE 101 (H) May 15, 2023   BUN 27 (H) 05/15/23   CREATININE 1.53 (H) 2023-05-15   CALCIUM 9.2 05-15-23   GFRNONAA 34 (L) 2023-05-15   GFRAA 37 (L) 05/03/2020    Lipid Panel:  Lab Results  Component  Value Date   CHOL 103 04/18/2023   HDL 42 04/18/2023   LDLCALC 42 04/18/2023   TRIG 95 04/18/2023   CHOLHDL 2.5 04/18/2023      HgbA1c:  Lab Results  Component Value Date   HGBA1C 5.4 04/17/2023    INR  Lab Results  Component Value Date   INR 0.9 03/17/2020    APTT  Lab Results  Component Value Date   APTT 34 03/25/2017    MR Angio head without contrast and Carotid Duplex BL(MRA head will be personally reviewed): [Pending]  MRI Brain(Personally reviewed): 1. Question punctate foci of restricted diffusion involving the mid Splenium [coronal images only] and within the right cerebellar hemisphere as above. While these findings could be artifactual in nature, possible tiny acute ischemic infarcts are difficult to exclude, and could be considered in correct clinical setting. No associated hemorrhage. 2. 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 [On my review scan is also notable for a chronic hemorrhage in the anterior right frontal lobe, fairly cortical]  ECHO  1. Left ventricular ejection fraction, by estimation, is 55 to 60%. The  left ventricle has normal function. The left ventricle has no regional  wall motion abnormalities. Left ventricular diastolic parameters are  consistent with Grade I diastolic  dysfunction (impaired relaxation).   2. Right ventricular systolic function is normal. The right ventricular  size is normal. There is normal pulmonary artery systolic pressure. The  estimated right ventricular systolic pressure is 33.3 mmHg.   3. The mitral valve is normal in structure. Moderate mitral valve   regurgitation. No evidence of mitral stenosis.   4. Tricuspid valve regurgitation is moderate.   5. The aortic valve is normal in structure. Aortic valve regurgitation is  mild. Aortic valve sclerosis/calcification is present, without any  evidence of aortic stenosis.   6. The inferior vena cava is normal in size with greater than 50%  respiratory variability, suggesting right atrial pressure of 3 mmHg.   7. Agitated saline contrast bubble study was negative, with no evidence  of any interatrial shunt.   Neurodiagnostics rEEG:  [Pending]  Impression   82 year old woman with multiple vascular list factors as detailed above including hypertension, hyperlipidemia, CAD presenting with left leg weakness.  First episode on waking, second episode after rhythmic shaking of the left lower extremity with classic description of a jacksonian march.  This is very concerning for focal seizure and may be secondary to the right frontal chronic stroke with associated chronic hemorrhage as a focus, with seizure threshold potentially lowered by buspirone (though data are controversial as to whether buspirone truly lowers the seizure threshold or may actually be helpful as an antiseizure medication).  However limb shaking TIA would be on the differential as well, and therefore I do think she would benefit from completing vessel imaging; with prior reported history of possible atrial fibrillation (though not substantiated per prior notes), I do think it is also reasonable to repeat echocardiogram and proceed with a Zio patch if positive for left atrial enlargement  Given the clinical story highly concerning for focal seizure, we will stop buspirone and start lamotrigine which should be helpful both from a mood as well as seizure protection.  Seizure precautions were reviewed with patient and daughter at bedside  The punctate possible strokes on MRI do not fit the clinical history provided, and therefore I favor  these to be artifact  Notably it  does not seem that her atrial fibrillation has ever been substantiated although it is frequently listed in her problem list.  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 This favors limb shaking TIA -- will obtain CTA as next step and follow along. Discussed with Dr. Lyn Hollingshead via phone  Recommendations  -Will decide whether to continue Plavix for 21 to 90-day course depending on vessel imaging [will be updated after imaging reviewed] -MRA head (ordered) -Routine EEG (ordered) -Discontinue buspirone (order discontinued) -Given creatinine clearance is less than 30 and she is older, will very gradually titrate lamotrigine as below Estimated Creatinine Clearance: 26.1 mL/min (A) (by C-G formula based on SCr of 1.53 mg/dL (H)).    Morning  Night  Week 1 and 2   none   25 mg   Week 3 and 4   none   50 mg  Week 5   25 mg   50 mg  Week 6   25 mg   75 mg  Week 7   50 mg   75 mg  Week 8   75 mg   75 mg  Week 9   75 mg  100 mg  Week 10  100 mg  100 mg  -Lamotrigine level should be checked prior to further titration or if patient is having excessive sleepiness prior to week 10 -Echocardiogram pending, if there is evidence of left atrial enlargement, recommend Zio patch  -Outpatient referral to neurology as recommended, she last saw Dr. Malvin Johns at [facility] in 2020 so would be a new patient; please provide referral  Neurology will follow up the workup above but otherwise will sign off.  Please do reach out if additional questions or concerns arise  Lamotrigine titration plan above as well as seizure precautions were included in patient's discharge instructions  Standard seizure precautions: Per [facility] statutes, patients with seizures are not allowed to drive until   they have been seizure-free for six months. Use caution when using heavy equipment or power tools. Avoid working on ladders or at heights. Take showers instead of baths. Ensure the water temperature is not too high on the home water heater. Do not go swimming alone. When caring for infants or small children, sit down when holding, feeding, or changing them to minimize risk of injury to the child in the event you have a seizure.  To reduce risk of seizures, maintain good sleep hygiene avoid alcohol and illicit drug use, take all anti-seizure medications as prescribed.   ______________________________________________________________________    Nunzio Cory MD-PhD [facility] [phone]

## 2023-04-18 NOTE — Evaluation (Addendum)
Occupational Therapy Evaluation Patient Details Name: Christine Burnett MRN: [id number] DOB: 20-Aug-1940 Today's Date: 04/18/2023   History of Present Illness 82 y.o. female with PMHx of stage III CKD, COPD, anxiety, osteoarthritis, dyslipidemia, CVA and TIA, who presented to ED with acute onset of LLE weakness feeling leg was heavy and dragging it when walking- improved within 15-20 minutes, then reports LLE shakiness. Also noted heaviness over L side of her head that is now resolved. MRI findings: Question punctate foci of restricted diffusion involving the mid  splenium and within the right cerebellar hemisphere, possible tiny acute  ischemic infarcts are difficult to exclude and Underlying age-related cerebral atrophy with advanced chronic microvascular ischemic disease, with small remote bilateral  cerebellar infarcts, L >R.   Clinical Impression   Pt was seen for OT evaluation this date. Prior to hospital admission, pt was living at home alone using a [facility] for mobility. Reports IND with ADLs and IADLs with her daughter who lives close by driving her to MD appts, grocery store, etc. 1 fall in Sept 2024.  Pt presents to acute OT demonstrating impaired ADL performance and functional mobility 2/2 weakness, balance deficits and dizziness (See OT problem list for additional functional deficits). No UE deficits in ROM, strength, coordination or sensation noted. Pt currently requires MOD I with bed mobility. SBA for STS from EOB to RW and SUP for mobility using RW to get to bathroom and without AD to stand at sink to perform hand hygiene and walk back to bed. No LOB noted. Pt sat at EOB and performed self feeding with set up assist. While talking with therapist, pt noted to have a dizzy spell where she reported her head felt numb and heavy and that she would not be able to walk feeling like that. Pt noted to need CGA for STS following the spell and OT notified nurse and had pt return to bed. BP WFL, HR 108  max during session. Pt safe to return home prior to dizzy spell, however would recommend further medical stability or home with 24/7 supervision if these spells continued.  Pt would benefit from skilled OT services to address noted impairments and functional limitations (see below for any additional details) in order to maximize safety and independence while minimizing falls risk and caregiver burden.        If plan is discharge home, recommend the following: A little help with walking and/or transfers;Assistance with cooking/housework;Help with stairs or ramp for entrance;A little help with bathing/dressing/bathroom    Functional Status Assessment  Patient has had a recent decline in their functional status and demonstrates the ability to make significant improvements in function in a reasonable and predictable amount of time.  Equipment Recommendations  None recommended by OT    Recommendations for Other Services       Precautions / Restrictions Precautions Precautions: Fall Restrictions Weight Bearing Restrictions: No      Mobility Bed Mobility Overal bed mobility: Modified Independent                  Transfers Overall transfer level: Needs assistance Equipment used: Rolling walker (2 wheels) Transfers: Sit to/from Stand Sit to Stand: Supervision           General transfer comment: SUP for initial STS, however CGA following dizzy spell      Balance Overall balance assessment: Needs assistance Sitting-balance support: Feet supported Sitting balance-Leahy Scale: Good       Standing balance-Leahy Scale: Fair Standing balance comment: used  RW and no AD to ambulate in room with SUP; however CGA for STS following dizzy spell                           ADL either performed or assessed with clinical judgement   ADL Overall ADL's : Needs assistance/impaired Eating/Feeding: Set up;Sitting   Grooming: Standing;Supervision/safety;Wash/dry Furniture conservator/restorer: Supervision/safety;Rolling walker (2 wheels);Ambulation   Toileting- Clothing Manipulation and Hygiene: Supervision/safety;Sit to/from stand;Sitting/lateral lean       Functional mobility during ADLs: Supervision/safety General ADL Comments: ambulated to the bathroom using RW with SUP, then ambulated back from bathroom to bed with no AD with SUP as well; no BLE deficits noted however once pt was left seated EOB and had dizzy/heavy/numb spell of her head she was unsteady     Vision Baseline Vision/History: 0 No visual deficits       Perception         Praxis         Pertinent Vitals/Pain Pain Assessment Pain Assessment: No/denies pain     Extremity/Trunk Assessment Upper Extremity Assessment Upper Extremity Assessment: Overall WFL for tasks assessed   Lower Extremity Assessment Lower Extremity Assessment: Generalized weakness       Communication Communication Communication: No apparent difficulties   Cognition Arousal: Alert Behavior During Therapy: WFL for tasks assessed/performed Overall Cognitive Status: Within Functional Limits for tasks assessed                                 General Comments: A and O x4; pt noted to have a dizzy spell where she said her head felt numb/heavy following OT eval, notified nurse and pt returned to bed with it subsiding     General Comments  HR 108 highest during session; BP 142/91 following return to bed after dizzy spell    Exercises Other Exercises Other Exercises: Edu on role of OT in acute setting and importance of therapy to ensure safety and IND upon return home.   Shoulder Instructions      Home Living Family/patient expects to be discharged to:: Private residence Living Arrangements: Alone Available Help at Discharge: Family;Available PRN/intermittently (daughter lives very close) Type of Home: House Home Access: Stairs to enter Secretary/administrator of Steps:  4 Entrance Stairs-Rails: Right Home Layout: One level     Bathroom Shower/Tub: Chief Strategy Officer: Standard Bathroom Accessibility: Yes   Home Equipment: Cane - single point;Shower seat;BSC/3in1;Grab bars - tub/shower          Prior Functioning/Environment Prior Level of Function : Independent/Modified Independent             Mobility Comments: report she will go out into community with daughters, typically uses [facility] for mobility; h/o falls with most recent in Sept 2024 ADLs Comments: IND with ADL performance and IADLs including cooking, cleaning, caring for her animal        OT Problem List: Decreased strength;Decreased activity tolerance;Impaired balance (sitting and/or standing)      OT Treatment/Interventions: Self-care/ADL training;Therapeutic exercise;Therapeutic activities;DME and/or AE instruction;Energy conservation;Patient/family education;Balance training    OT Goals(Current goals can be found in the care plan section) Acute Rehab OT Goals Patient Stated Goal: return home OT Goal Formulation: With patient Time For Goal Achievement: 05/02/23 Potential to Achieve Goals:  Good ADL Goals Pt Will Perform Lower Body Bathing: with supervision;sitting/lateral leans;sit to/from stand Pt Will Perform Lower Body Dressing: with supervision;sitting/lateral leans;sit to/from stand Pt Will Transfer to Toilet: with modified independence;regular height toilet;ambulating Pt Will Perform Toileting - Clothing Manipulation and hygiene: with modified independence;sit to/from stand;sitting/lateral leans Additional ADL Goal #1: Pt will demo/verbalize use of 1 learned ECS during ADL performance to maximize safety and IND and reduce risk of overexertion upon return home.  OT Frequency: Min 1X/week    Co-evaluation              AM-PAC OT "6 Clicks" Daily Activity     Outcome Measure Help from another person eating meals?: None Help from another person taking care  of personal grooming?: None   Help from another person bathing (including washing, rinsing, drying)?: A Little Help from another person to put on and taking off regular upper body clothing?: None Help from another person to put on and taking off regular lower body clothing?: A Little 6 Click Score: 18   End of Session Equipment Utilized During Treatment: Rolling walker (2 wheels) Nurse Communication: Mobility status  Activity Tolerance: Patient tolerated treatment well Patient left: in bed;with call bell/phone within reach;with bed alarm set  OT Visit Diagnosis: Other abnormalities of gait and mobility (R26.89);Dizziness and giddiness (R42);Muscle weakness (generalized) (M62.81);History of falling (Z91.81)                Time: 8119-1478 OT Time Calculation (min): 36 min Charges:  OT General Charges $OT Visit: 1 Visit OT Treatments $Self Care/Home Management : 8-22 mins Wasil Wolke, OTR/L 04/18/23, 11:12 AM  Halen Antenucci E Dora Clauss 04/18/2023, 11:08 AM

## 2023-04-18 NOTE — Progress Notes (Signed)
*  PRELIMINARY RESULTS* Echocardiogram 2D Echocardiogram has been performed.  Cristela Blue 04/18/2023, 8:15 AM

## 2023-04-18 NOTE — Evaluation (Signed)
Physical Therapy Evaluation Patient Details Name: Christine Burnett MRN: [id number] DOB: April 02, 1941 Today's Date: 04/18/2023  History of Present Illness  82 y.o. female with PMHx of stage III CKD, COPD, anxiety, osteoarthritis, dyslipidemia, CVA and TIA, who presented to ED with acute onset of LLE weakness feeling leg was heavy and dragging it when walking- improved within 15-20 minutes, then reports LLE shakiness. Also noted heaviness over L side of her head that is now resolved. MRI findings: Question punctate foci of restricted diffusion involving the mid  splenium and within the right cerebellar hemisphere, possible tiny acute  ischemic infarcts are difficult to exclude and Underlying age-related cerebral atrophy with advanced chronic microvascular ischemic disease, with small remote bilateral  cerebellar infarcts, L >R.  Clinical Impression  Pt pleasant and eager to work with PT, recognizes this PT from previous hospitalization months ago.  She ultimately did well and showed good mobility and confidence.  She reported fleeting dizziness on first sitting up but quickly reports feeling well and ready to ambulation, which she did and ultimately went ~350 ft w/o issue.  She used walker most of the time, 20 ft of single UE support trial.  HR did stay in the 110s most of the effort, SpO2 90s on room air.  Pt endorses some fatigue with the effort, but reports feeling only a little off from her baseline.  Pt will benefit from continued PT to address functional limitations.        If plan is discharge home, recommend the following: Assist for transportation;Assistance with cooking/housework;Help with stairs or ramp for entrance   Can travel by private vehicle        Equipment Recommendations None recommended by PT  Recommendations for Other Services       Functional Status Assessment Patient has had a recent decline in their functional status and demonstrates the ability to make significant  improvements in function in a reasonable and predictable amount of time.     Precautions / Restrictions Precautions Precautions: Fall Restrictions Weight Bearing Restrictions: No      Mobility  Bed Mobility Overal bed mobility: Modified Independent                  Transfers Overall transfer level: Modified independent Equipment used: Rolling walker (2 wheels) Transfers: Sit to/from Stand Sit to Stand: Supervision           General transfer comment: Able to rise to standing with confidence    Ambulation/Gait Ambulation/Gait assistance: Supervision Gait Distance (Feet): 350 Feet Assistive device: Rolling walker (2 wheels), 1 person hand held assist         General Gait Details: Pt did well, reports feeling close to her baseline, but needing more UE support than typical.  She was able to ambulate with single UE on hallway rail ~20 ft w/o LOB but was slow and hesistant and clearly more comfortable with using RW.  HR stayed below 120 t/o the effort but did generally stay >110 with some reports of fatigue.  Stairs            Wheelchair Mobility     Tilt Bed    Modified Rankin (Stroke Patients Only)       Balance Overall balance assessment: Needs assistance Sitting-balance support: Feet supported Sitting balance-Leahy Scale: Good       Standing balance-Leahy Scale: Good Standing balance comment: no LOBs, good confidnce/safety with walker/bilat UEs, slower with only single UE support but w/o safety concerns  Pertinent Vitals/Pain Pain Assessment Pain Assessment: No/denies pain    Home Living Family/patient expects to be discharged to:: Private residence Living Arrangements: Children;Alone (unsure if daughter is next to or with her but she is there QD after work) Available Help at Discharge: Family;Available PRN/intermittently (daughter works but is available) Type of Home: House Home Access: Stairs to  enter Entrance Stairs-Rails: Right Entrance Stairs-Number of Steps: 4   Home Layout: One level Home Equipment: Cane - single point;Shower seat;BSC/3in1;Grab bars - tub/shower;Rollator (4 wheels)      Prior Function Prior Level of Function : Independent/Modified Independent             Mobility Comments: report she will go out into community with daughters, typically uses [facility] for mobility; h/o falls with most recent in Sept 2024 ADLs Comments: IND with ADL performance and IADLs including cooking, cleaning, caring for her animal     Extremity/Trunk Assessment   Upper Extremity Assessment Upper Extremity Assessment: Overall WFL for tasks assessed    Lower Extremity Assessment Lower Extremity Assessment: Overall WFL for tasks assessed (equal bilaterally)       Communication   Communication Communication: No apparent difficulties  Cognition Arousal: Alert Behavior During Therapy: WFL for tasks assessed/performed Overall Cognitive Status: Within Functional Limits for tasks assessed                                 General Comments: very brief dizziness on sitting up, BP WNL, no vestibular signs, quick to report feeling well and eager to walk        General Comments General comments (skin integrity, edema, etc.): HR 108 highest during session; BP 142/91 following return to bed after dizzy spell    Exercises     Assessment/Plan    PT Assessment Patient needs continued PT services  PT Problem List Decreased strength;Decreased range of motion;Decreased activity tolerance;Decreased balance;Decreased mobility;Decreased knowledge of use of DME;Decreased safety awareness       PT Treatment Interventions DME instruction;Gait training;Stair training;Functional mobility training;Therapeutic activities;Therapeutic exercise;Balance training;Cognitive remediation;Patient/family education    PT Goals (Current goals can be found in the Care Plan section)  Acute Rehab  PT Goals Patient Stated Goal: Go home PT Goal Formulation: With patient Time For Goal Achievement: 05/01/23 Potential to Achieve Goals: Good    Frequency Min 1X/week     Co-evaluation               AM-PAC PT "6 Clicks" Mobility  Outcome Measure Help needed turning from your back to your side while in a flat bed without using bedrails?: None Help needed moving from lying on your back to sitting on the side of a flat bed without using bedrails?: None Help needed moving to and from a bed to a chair (including a wheelchair)?: None Help needed standing up from a chair using your arms (e.g., wheelchair or bedside chair)?: None Help needed to walk in hospital room?: A Little Help needed climbing 3-5 steps with a railing? : A Little 6 Click Score: 22    End of Session Equipment Utilized During Treatment: Gait belt Activity Tolerance: Patient tolerated treatment well Patient left: with call bell/phone within reach;with chair alarm set Nurse Communication: Mobility status PT Visit Diagnosis: Unsteadiness on feet (R26.81);Dizziness and giddiness (R42)    Time: 3875-6433 PT Time Calculation (min) (ACUTE ONLY): 17 min   Charges:   PT Evaluation $PT Eval Low Complexity: 1 Low PT  Treatments $Gait Training: 8-22 mins PT General Charges $$ ACUTE PT VISIT: 1 Visit         Malachi Pro, DPT 04/18/2023, 12:10 PM

## 2023-04-18 NOTE — Progress Notes (Signed)
PROGRESS NOTE    Christine Burnett   WUJ:[id number] DOB: February 22, 1941  DOA: 04/17/2023 Date of Service: 04/18/23 which is hospital day 0  PCP: Danelle Berry, PA-C    HPI: Christine Burnett is a 82 y.o. 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 However her symptoms gradually improved and she was back at her baseline. Later that day she was at a store and noticed uncontrollable twitching of her left leg which started in her groin and then progressed down the leg, lasting a few minutes and then resolving. Postevent she had weakness in that leg which gradually resolved again. She came into the ED for further evaluation.   Hospital course / significant events:  11/12: admitted to hospitalist service for TIA w/u. MRI brain concerning for possible tiny acute ischemic infarcts and chronic hemorrhage in the anterior right frontal lobe, fairly cortical. Pending Echo and carotid US.  11/13: neurology consult - Per neurology, punctate possible strokes on MRI do not fit the clinical history provided, therefore favor these to be artifact and proceeding w/ EEG, MRA H/N. Echo no evidence for LA enlargement no need for Zio monitor. Seizure precautions, initiating lamotrigine and d/c BuSpar.   Consultants:  Neurology   Procedures/Surgeries: none      ASSESSMENT & PLAN:   TIA, possible seizure-like activity but more concern for limb-shaking TIA given MRA brain and carotid US findings Echo no LA enlargement, Afib less likely  Further eval w/ EEG  pending Further eval a/ CTA H/N pending  D/w neurology 04/18/23 4:45 PM and will continue to follow results, may need to involve neurosurgery / IR depending on imaging  D/c BuSpar Initiate Lamotrigine  Seizure precautions  Statin ASA decide whether to continue Plavix for 21 to 90-day course depending on vessel imaging per neurology   CKD3b Stable/baseline Caution w/ contrast studies but need CTA H/N for diagnostic/prognostic purposes Follow BMP in AM  Chest pain - resolved Repeat troponin / EKG as needed   Essential hypertension continue antihypertensives with permissive parameters.   Dyslipidemia Statin Lipid panel   Anxiety and depression D/c BuSpar and starting Lamotrigine per neurology  Continue Paxil   Chronic obstructive pulmonary disease (COPD) (HCC) Albuterol prn     Normal weight based on BMI: Body mass index is 22.06 kg/m.  Underweight - under 18.5  normal weight - 18.5 to 24.9 overweight - 25 to 29.9 obese - 30 or more   DVT prophylaxis: ambulation IV fluids: no continuous IV fluids  Nutrition: regular Central lines / invasive devices: none  Code Status: FULL CODE ACP documentation reviewed: 04/18/23 and none on file in [facility]  TOC needs: none at this time Barriers to dispo / significant pending items: EEG, CTA, monitor renal fxn, may need further consultation/intervention pending results              Subjective / Brief ROS:  Patient reports still feeling some "heaviness" in her head No new weakness  Denies CP/SOB.  Pain controlled.   Tolerating diet.  Reports no concerns w/ urination/defecation.   Family Communication: daughter is at bedside on rounds  Objective Findings:  Vitals:   04/18/23 0039 04/18/23 0416 04/18/23 0950 04/18/23 1138  BP:  (!) 149/74 (!) 176/79 (!) 147/98  Pulse:  93 93 85  Resp:  18 17 17   Temp:  98 F (36.7 C) 97.9 F (36.6 C) 98 F (36.7 C)  TempSrc:      SpO2:  97% 99% 98%  Weight: 62 kg      Height: 5\' 6"  (1.676 m)       Intake/Output Summary (Last 24 hours) at 04/18/2023 1651 Last data filed at 04/18/2023 1532 Gross per 24 hour  Intake 245.2 ml  Output --  Net 245.2 ml   Filed Weights   04/18/23 0039  Weight: 62 kg    Examination:  Physical Exam Constitutional:      General: She is not in acute distress. Cardiovascular:     Rate and Rhythm: Normal rate and regular rhythm.  Pulmonary:     Effort: Pulmonary effort is normal.     Breath sounds: Wheezing (scattered, mild) present.  Musculoskeletal:     Right lower leg: No edema.     Left lower leg: No edema.  Neurological:     General: No focal deficit present.     Mental Status: She is alert and oriented to person, place, and time. Mental status is at baseline.  Psychiatric:        Mood and Affect: Mood normal.        Behavior: Behavior normal.          Scheduled Medications:   aspirin EC  81 mg Oral QHS   atorvastatin  40 mg Oral QHS   calcium carbonate  300 mg of elemental calcium Oral Q MTWThF   cholecalciferol  2,000 Units Oral QHS   clopidogrel  75 mg Oral Daily   enoxaparin (LOVENOX) injection  30 mg Subcutaneous Q24H   lamoTRIgine  25 mg Oral QHS   melatonin  2.5 mg Oral QHS   multivitamin with minerals  1 tablet Oral Daily   PARoxetine  40 mg Oral q morning    Continuous Infusions:   PRN Medications:  acetaminophen **OR** acetaminophen (TYLENOL) oral liquid 160 mg/5 mL **OR** acetaminophen, albuterol, ondansetron (ZOFRAN) IV, senna-docusate, traZODone  Antimicrobials from admission:  Anti-infectives (From admission, onward)    None           Data Reviewed:  I have personally reviewed the following...  CBC: Recent Labs  Lab 04/17/23 1843  WBC 5.5  HGB 12.1  HCT 36.5  MCV 101.7*  PLT 199   Basic Metabolic Panel: Recent Labs  Lab 04/17/23 1843  NA 136  K 4.0  CL 103  CO2 24  GLUCOSE 101*  BUN 27*  CREATININE 1.53*  CALCIUM 9.2   GFR: Estimated  Creatinine Clearance: 26.1 mL/min (A) (by C-G formula based on SCr of 1.53 mg/dL (H)). Liver Function Tests: No results for input(s): "AST", "ALT", "ALKPHOS", "BILITOT", "PROT", "ALBUMIN" in the last 168 hours. No results for input(s): "LIPASE", "AMYLASE" in the last 168 hours. No results for input(s): "AMMONIA" in the last 168 hours. Coagulation Profile: No results for input(s): "INR", "PROTIME" in the last 168 hours. Cardiac Enzymes: No results for input(s): "CKTOTAL", "CKMB", "CKMBINDEX", "TROPONINI" in the last 168 hours. BNP (last 3 results) No results for input(s): "PROBNP" in the last 8760 hours. HbA1C: Recent Labs    04/17/23 1843  HGBA1C 5.4   CBG: No results for input(s): "GLUCAP" in the last 168 hours. Lipid Profile: Recent  Labs    04/18/23 0454  CHOL 103  HDL 42  LDLCALC 42  TRIG 95  CHOLHDL 2.5   Thyroid Function Tests: No results for input(s): "TSH", "T4TOTAL", "FREET4", "T3FREE", "THYROIDAB" in the last 72 hours. Anemia Panel: No results for input(s): "VITAMINB12", "FOLATE", "FERRITIN", "TIBC", "IRON", "RETICCTPCT" in the last 72 hours. Most Recent Urinalysis On File:     Component Value Date/Time   COLORURINE YELLOW (A) 04/17/2023 2133   APPEARANCEUR CLEAR (A) 04/17/2023 2133   APPEARANCEUR Clear 01/28/2016 0841   LABSPEC 1.013 04/17/2023 2133   PHURINE 6.0 04/17/2023 2133   GLUCOSEU NEGATIVE 04/17/2023 2133   HGBUR NEGATIVE 04/17/2023 2133   BILIRUBINUR NEGATIVE 04/17/2023 2133   BILIRUBINUR neg 07/29/2019 1541   BILIRUBINUR Negative 01/28/2016 0841   KETONESUR NEGATIVE 04/17/2023 2133   PROTEINUR NEGATIVE 04/17/2023 2133   UROBILINOGEN 0.2 07/29/2019 1541   NITRITE NEGATIVE 04/17/2023 2133   LEUKOCYTESUR NEGATIVE 04/17/2023 2133   Sepsis Labs: @LABRCNTIP (procalcitonin:4,lacticidven:4) Microbiology: No results found for this or any previous visit (from the past 240 hour(s)).    Radiology Studies last 3 days: US Carotid Bilateral  Result  Date: 04/18/2023 CLINICAL DATA:  TIA Hyperlipidemia Former tobacco user EXAM: BILATERAL CAROTID DUPLEX ULTRASOUND TECHNIQUE: Wallace Cullens scale imaging, color Doppler and duplex ultrasound were performed of bilateral carotid and vertebral arteries in the neck. COMPARISON:  07/29/2018 FINDINGS: Criteria: Quantification of carotid stenosis is based on velocity parameters that correlate the residual internal carotid diameter with NASCET-based stenosis levels, using the diameter of the distal internal carotid lumen as the denominator for stenosis measurement. The following velocity measurements were obtained: RIGHT ICA: 39/4 cm/sec CCA: 18/11 cm/sec SYSTOLIC ICA/CCA RATIO:  2.2 ECA: 50 cm/sec LEFT ICA: 242/36 cm/sec CCA: 77/15 cm/sec SYSTOLIC ICA/CCA RATIO:  3.1 ECA: 156 cm/sec RIGHT CAROTID ARTERY: Mild calcified plaque of the distal common carotid artery. Common carotid artery waveform is abnormal and monophasic. Internal carotid artery waveform is abnormal with reversal of systolic flow. RIGHT VERTEBRAL ARTERY:  Bidirectional flow. LEFT CAROTID ARTERY: Bulky calcified plaque noted at the distal left common and proximal internal carotid arteries. Common carotid artery waveform is abnormal and monophasic. LEFT VERTEBRAL ARTERY:  Antegrade flow. Right upper extremity blood pressure is significantly lower than left with right side measuring 130/98 mm Hg and left measuring 164/82 mm Hg. IMPRESSION: 1. Monophasic waveform in the right common carotid artery and reversal of systolic flow in right internal carotid artery consistent with significant proximal stenosis. The site of significant origin of the right brachiocephalic artery asymmetrically decreased right upper extremity blood pressure as well as bidirectional flow in the right vertebral artery. 2. Monophasic waveform in the left common carotid artery consistent with significant stenosis at the origin of the common carotid artery. 3. 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 4. Further evaluation with CT angiography of the neck is recommended. Electronically Signed   By: Acquanetta Belling M.D.   On: 04/18/2023 15:32   MR ANGIO HEAD WO CONTRAST  Result Date: 04/18/2023 CLINICAL DATA:  Neuro deficit, acute, stroke suspected EXAM: MRA HEAD WITHOUT CONTRAST TECHNIQUE: Angiographic images of the Circle of Willis were acquired using MRA technique without intravenous contrast. 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 Normal flow void was visualized on same day brain MRI. There is reconstitution of the level of  the ICA terminus via collaterals from the circle-of-Willis. There is normal arborization in the bilateral MCA territories. Posterior circulation: There is nearly absent flow void in the basilar artery with robust PCOM bilaterally. P2 and P3 segments demonstrate normal flow signal. Anatomic variants: Fetal PCAs bilaterally. Other: None. IMPRESSION: 1. Asymmetrically decreased flow void in the right petrous, cavernous, and supraclinoid ICA is worrisome for interval development of a new right ICA occlusion. There is reconstitution of the level of the ICA terminus via collaterals from the circle-of-Willis. Recommend further evaluation with a CT head and neck angiogram 2. Nearly absent flow void in the basilar artery with robust PCOM bilaterally. 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 Recommend attention on CTA. These results will be called to the ordering clinician or representative by the Radiologist Assistant, and communication documented in the PACS or [employer]. Electronically Signed   By: Lorenza Cambridge M.D.   On: 04/18/2023 14:48   ECHOCARDIOGRAM COMPLETE BUBBLE STUDY  Result Date: 04/18/2023    ECHOCARDIOGRAM  REPORT   Patient Name:   ARDES GALBAN Date of Exam: 04/18/2023 Medical Rec #:  [id number]          Height:       66.0 in Accession #:    [id number]         Weight:       136.7 lb Date of Birth:  May 15, 1941          BSA:          1.701 m Patient Age:    82 years           BP:           149/74 mmHg Patient Gender: F                  HR:           93 bpm. Exam Location:  [facility] Procedure: 2D Echo, Cardiac Doppler, Color Doppler and Saline Contrast Bubble            Study Indications:     TIA 435.9 / G45.9  History:         Patient has prior history of Echocardiogram examinations, most                  recent 01/26/2021. COPD, TIA and Stroke; Risk                  Factors:Dyslipidemia.  Sonographer:     Cristela Blue Referring Phys:  [id number] JAN A MANSY Diagnosing Phys: Julien Nordmann MD IMPRESSIONS  1. Left ventricular ejection fraction, by estimation, is 55 to 60%. The left ventricle has normal function. The left ventricle has no regional wall motion abnormalities. Left ventricular diastolic parameters are consistent with Grade I diastolic dysfunction (impaired relaxation).  2. Right ventricular systolic function is normal. The right ventricular size is normal. There is normal pulmonary artery systolic pressure. The estimated right ventricular systolic pressure is 33.3 mmHg.  3. The mitral valve is normal in structure. Moderate mitral valve regurgitation. No evidence of mitral stenosis.  4. Tricuspid valve regurgitation is moderate.  5. The aortic valve is normal in structure. Aortic valve regurgitation is mild. Aortic valve sclerosis/calcification is present, without any evidence of aortic stenosis.  6. The inferior vena cava is normal in size with greater than 50% respiratory variability, suggesting right atrial pressure of 3 mmHg.  7. Agitated saline contrast bubble study was negative, with no evidence of  any interatrial shunt. FINDINGS  Left Ventricle: Left ventricular ejection fraction, by estimation, is 55 to  60%. The left ventricle has normal function. The left ventricle has no regional wall motion abnormalities. The left ventricular internal cavity size was normal in size. There is  no left ventricular hypertrophy. Left ventricular diastolic parameters are consistent with Grade I diastolic dysfunction (impaired relaxation). Right Ventricle: The right ventricular size is normal. No increase in right ventricular wall thickness. Right ventricular systolic function is normal. There is normal pulmonary artery systolic pressure. The tricuspid regurgitant velocity is 2.66 m/s, and  with an assumed right atrial pressure of 5 mmHg, the estimated right ventricular systolic pressure is 33.3 mmHg. Left Atrium: Left atrial size was normal in size. Right Atrium: Right atrial size was normal in size. Pericardium: There is no evidence of pericardial effusion. Mitral Valve: The mitral valve is normal in structure. There is mild calcification of the mitral valve leaflet(s). Moderate mitral valve regurgitation. No evidence of mitral valve stenosis. MV peak gradient, 10.4 mmHg. The mean mitral valve gradient is 4.0 mmHg. Tricuspid Valve: The tricuspid valve is normal in structure. Tricuspid valve regurgitation is moderate . No evidence of tricuspid stenosis. Aortic Valve: The aortic valve is normal in structure. Aortic valve regurgitation is mild. Aortic valve sclerosis/calcification is present, without any evidence of aortic stenosis. Aortic valve mean gradient measures 4.2 mmHg. Aortic valve peak gradient measures 7.0 mmHg. Aortic valve area, by VTI measures 2.26 cm. Pulmonic Valve: The pulmonic valve was normal in structure. Pulmonic valve regurgitation is not visualized. No evidence of pulmonic stenosis. Aorta: The aortic root is normal in size and structure. Venous: The inferior vena cava is normal in size with greater than 50% respiratory variability, suggesting right atrial pressure of 3 mmHg. IAS/Shunts: No atrial level shunt  detected by color flow Doppler. Agitated saline contrast was given intravenously to evaluate for intracardiac shunting. Agitated saline contrast bubble study was negative, with no evidence of any interatrial shunt. There  is no evidence of a patent foramen ovale. There is no evidence of an atrial septal defect.  LEFT VENTRICLE PLAX 2D LVIDd:         4.10 cm   Diastology LVIDs:         2.40 cm   LV e' medial:    5.11 cm/s LV PW:         1.00 cm   LV E/e' medial:  13.5 LV IVS:        1.00 cm   LV e' lateral:   5.98 cm/s LVOT diam:     2.00 cm   LV E/e' lateral: 11.6 LV SV:         59 LV SV Index:   35 LVOT Area:     3.14 cm  RIGHT VENTRICLE RV Basal diam:  2.20 cm RV Mid diam:    1.50 cm LEFT ATRIUM           Index        RIGHT ATRIUM          Index LA diam:      2.40 cm 1.41 cm/m   RA Area:     9.17 cm LA Vol (A2C): 18.4 ml 10.82 ml/m  RA Volume:   16.70 ml 9.82 ml/m LA Vol (A4C): 26.5 ml 15.58 ml/m  AORTIC VALVE AV Area (Vmax):    1.84 cm AV Area (Vmean):   1.76 cm AV Area (VTI):     2.26 cm AV Vmax:  132.50 cm/s AV Vmean:          97.350 cm/s AV VTI:            0.260 m AV Peak Grad:      7.0 mmHg AV Mean Grad:      4.2 mmHg LVOT Vmax:         77.40 cm/s LVOT Vmean:        54.600 cm/s LVOT VTI:          0.187 m LVOT/AV VTI ratio: 0.72  AORTA Ao Root diam: 2.90 cm MITRAL VALVE                TRICUSPID VALVE MV Area (PHT): 8.62 cm     TR Peak grad:   28.3 mmHg MV Area VTI:   2.27 cm     TR Vmax:        266.00 cm/s MV Peak grad:  10.4 mmHg MV Mean grad:  4.0 mmHg     SHUNTS MV Vmax:       1.61 m/s     Systemic VTI:  0.19 m MV Vmean:      93.8 cm/s    Systemic Diam: 2.00 cm MV Decel Time: 88 msec MV E velocity: 69.20 cm/s MV A velocity: 123.00 cm/s MV E/A ratio:  0.56 Julien Nordmann MD Electronically signed by Julien Nordmann MD Signature Date/Time: 04/18/2023/11:16:32 AM    Final    MR BRAIN WO CONTRAST  Result Date: 04/18/2023 CLINICAL DATA:  Initial evaluation for acute TIA. EXAM: MRI HEAD  WITHOUT CONTRAST TECHNIQUE: Multiplanar, multiecho pulse sequences of the brain and surrounding structures were obtained without intravenous contrast. COMPARISON:  Prior CT from 04/17/2023. FINDINGS: Brain: Diffuse prominence of the CSF containing spaces compatible generalized cerebral atrophy. Patchy and confluent T2/FLAIR hyperintensity involving the periventricular and deep white matter both cerebral hemispheres as well as the pons, most likely related to chronic microvascular ischemic disease, advanced in nature. Small remote bilateral cerebellar infarcts, left greater than right. There is question of a punctate focus of restricted diffusion positioned along the midline of the splenium, seen only on coronal DWI sequence (series 18, image 24). Additionally, there is a questionable punctate focus of diffusion signal abnormality within the right cerebellar hemisphere as well (series 9, image 62). While these findings could be artifactual in nature, possible tiny acute ischemic infarcts are difficult to exclude, and could be considered in correct clinical setting. No associated hemorrhage. No other evidence for acute or subacute ischemia. Gray-white matter differentiation otherwise maintained. No acute intracranial hemorrhage. Small focus of chronic hemosiderin staining noted at the high anterior right frontal lobe (series 14, image 50), suggesting prior hemorrhage at this location. No mass lesion, midline shift or mass effect. No hydrocephalus or extra-axial fluid collection. Pituitary gland suprasellar region within normal limits. Vascular: Major intracranial vascular flow voids are maintained. Skull and upper cervical spine: Craniocervical junctional limits. Bone marrow signal intensity heterogeneous but overall within normal limits. No scalp soft tissue abnormality. Sinuses/Orbits: Globes and orbital soft tissues within normal limits. Scattered postop thickening present about the ethmoidal air cells. Paranasal  sinuses are otherwise clear. No mastoid effusion. Other: None. IMPRESSION: 1. Question punctate foci of restricted diffusion involving the mid splenium and within the right cerebellar hemisphere as above. While these findings could be artifactual in nature, possible tiny acute ischemic infarcts are difficult to exclude, and could be considered in correct clinical setting. No associated hemorrhage. 2. Underlying age-related cerebral atrophy with advanced chronic microvascular ischemic  disease, with small remote bilateral cerebellar infarcts, left greater than right. Electronically Signed   By: Rise Mu M.D.   On: 04/18/2023 02:09   CT HEAD WO CONTRAST  Result Date: 04/17/2023 CLINICAL DATA:  A shin for acute blurry vision, speech difficulty, left lower extremity weakness. EXAM: CT HEAD WITHOUT CONTRAST TECHNIQUE: Contiguous axial images were obtained from the base of the skull through the vertex without intravenous contrast. RADIATION DOSE REDUCTION: This exam was performed according to the departmental dose-optimization program which includes automated exposure control, adjustment of the mA and/or kV according to patient size and/or use of iterative reconstruction technique. COMPARISON:  Prior study from 07/13/2020. FINDINGS: Brain: Moderately advanced cerebral atrophy. 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 Remote left cerebellar infarct noted. No acute intracranial hemorrhage. No acute large vessel territory infarct. No mass lesion or midline shift. No hydrocephalus or extra-axial fluid collection. Vascular: No abnormal hyperdense vessel. Calcified atherosclerosis present at the skull base. Skull: Scalp soft tissues demonstrate no acute finding. Calvarium intact. Sinuses/Orbits: Globes orbital soft tissues within normal limits. Mild mucosal thickening present about the left ethmoidal air  cells. Visualized paranasal sinuses are otherwise clear. No mastoid effusion. Other: None. IMPRESSION: 1. No acute intracranial abnormality. 2. Moderately advanced cerebral atrophy with advanced chronic microvascular ischemic disease. 3. Remote left cerebellar infarct. Electronically Signed   By: Rise Mu M.D.   On: 04/17/2023 20:01       Time spent: 50 min    Sunnie Nielsen, DO [facility] 04/18/2023, 4:51 PM    Dictation software may have been used to generate the above note. Typos may occur and escape review in typed/dictated notes. Please contact Dr Lyn Hollingshead directly for clarity if needed.  Staff may message me via secure chat in Epic  but this may not receive an immediate response,  please page me for urgent matters!  If 7PM-7AM, please contact night coverage [email or web address]

## 2023-04-18 NOTE — Assessment & Plan Note (Signed)
-   We will continue BuSpar and Paxil.

## 2023-04-18 NOTE — Assessment & Plan Note (Signed)
-   We will follow serial troponins. - She is currently pain-free.

## 2023-04-18 NOTE — Progress Notes (Signed)
SLP Cancellation Note  Patient Details Name: Christine Burnett MRN: [id number] DOB: 1940-09-05   Cancelled treatment:       Reason Eval/Treat Not Completed: SLP screened, no needs identified, will sign off (chart reviewed; consulted NSG then met w/ pt in room during OT session)   Pt denied any difficulty swallowing and is currently on a regular diet; tolerates swallowing pills w/ water per NSG and ate/drank a little of her breakfast meal this morning "just fine".  Pt conversed in full conversation w/out expressive/receptive deficits noted; pt denied any speech-language deficits. Speech clear, intelligible. Followed directions w/ OT.  No further skilled ST services indicated as pt appears at her communication baseline. Pt agreed. NSG to reconsult if any change in status while admitted.      Jerilynn Som, MS, CCC-SLP Speech Language Pathologist Rehab Services; [facility] [phone] (ascom) [place] 04/18/2023, 9:58 AM

## 2023-04-18 NOTE — Assessment & Plan Note (Signed)
-   We will continue antihypertensives with permissive parameters. 

## 2023-04-19 DIAGNOSIS — I6521 Occlusion and stenosis of right carotid artery: Secondary | ICD-10-CM | POA: Diagnosis present

## 2023-04-19 DIAGNOSIS — R410 Disorientation, unspecified: Secondary | ICD-10-CM | POA: Diagnosis not present

## 2023-04-19 DIAGNOSIS — C3490 Malignant neoplasm of unspecified part of unspecified bronchus or lung: Secondary | ICD-10-CM | POA: Diagnosis not present

## 2023-04-19 DIAGNOSIS — I708 Atherosclerosis of other arteries: Secondary | ICD-10-CM | POA: Diagnosis present

## 2023-04-19 DIAGNOSIS — Z87891 Personal history of nicotine dependence: Secondary | ICD-10-CM | POA: Diagnosis not present

## 2023-04-19 DIAGNOSIS — I129 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 stage 4 chronic kidney disease, or unspecified chronic kidney disease: Secondary | ICD-10-CM | POA: Diagnosis present

## 2023-04-19 DIAGNOSIS — Z8249 Family history of ischemic heart disease and other diseases of the circulatory system: Secondary | ICD-10-CM | POA: Diagnosis not present

## 2023-04-19 DIAGNOSIS — J4489 Other specified chronic obstructive pulmonary disease: Secondary | ICD-10-CM | POA: Diagnosis present

## 2023-04-19 DIAGNOSIS — K922 Gastrointestinal hemorrhage, unspecified: Secondary | ICD-10-CM | POA: Diagnosis not present

## 2023-04-19 DIAGNOSIS — Z8042 Family history of malignant neoplasm of prostate: Secondary | ICD-10-CM | POA: Diagnosis not present

## 2023-04-19 DIAGNOSIS — E669 Obesity, unspecified: Secondary | ICD-10-CM | POA: Diagnosis present

## 2023-04-19 DIAGNOSIS — D7589 Other specified diseases of blood and blood-forming organs: Secondary | ICD-10-CM | POA: Diagnosis present

## 2023-04-19 DIAGNOSIS — I6523 Occlusion and stenosis of bilateral carotid arteries: Secondary | ICD-10-CM | POA: Diagnosis present

## 2023-04-19 DIAGNOSIS — I340 Nonrheumatic mitral (valve) insufficiency: Secondary | ICD-10-CM | POA: Diagnosis present

## 2023-04-19 DIAGNOSIS — I2510 Atherosclerotic heart disease of native coronary artery without angina pectoris: Secondary | ICD-10-CM | POA: Diagnosis present

## 2023-04-19 DIAGNOSIS — Z841 Family history of disorders of kidney and ureter: Secondary | ICD-10-CM | POA: Diagnosis not present

## 2023-04-19 DIAGNOSIS — K5730 Diverticulosis of large intestine without perforation or abscess without bleeding: Secondary | ICD-10-CM | POA: Diagnosis not present

## 2023-04-19 DIAGNOSIS — D62 Acute posthemorrhagic anemia: Secondary | ICD-10-CM | POA: Diagnosis not present

## 2023-04-19 DIAGNOSIS — J439 Emphysema, unspecified: Secondary | ICD-10-CM | POA: Diagnosis present

## 2023-04-19 DIAGNOSIS — Z8673 Personal history of transient ischemic attack (TIA), and cerebral infarction without residual deficits: Secondary | ICD-10-CM | POA: Diagnosis not present

## 2023-04-19 DIAGNOSIS — R569 Unspecified convulsions: Secondary | ICD-10-CM | POA: Diagnosis present

## 2023-04-19 DIAGNOSIS — E860 Dehydration: Secondary | ICD-10-CM | POA: Diagnosis present

## 2023-04-19 DIAGNOSIS — Q2549 Other congenital malformations of aorta: Secondary | ICD-10-CM | POA: Diagnosis not present

## 2023-04-19 DIAGNOSIS — K5790 Diverticulosis of intestine, part unspecified, without perforation or abscess without bleeding: Secondary | ICD-10-CM | POA: Diagnosis not present

## 2023-04-19 DIAGNOSIS — Z85118 Personal history of other malignant neoplasm of bronchus and lung: Secondary | ICD-10-CM | POA: Diagnosis not present

## 2023-04-19 DIAGNOSIS — I70201 Unspecified atherosclerosis of native arteries of extremities, right leg: Secondary | ICD-10-CM | POA: Diagnosis not present

## 2023-04-19 DIAGNOSIS — E785 Hyperlipidemia, unspecified: Secondary | ICD-10-CM | POA: Diagnosis present

## 2023-04-19 DIAGNOSIS — F32A Depression, unspecified: Secondary | ICD-10-CM | POA: Diagnosis present

## 2023-04-19 DIAGNOSIS — N179 Acute kidney failure, unspecified: Secondary | ICD-10-CM | POA: Diagnosis not present

## 2023-04-19 DIAGNOSIS — G459 Transient cerebral ischemic attack, unspecified: Secondary | ICD-10-CM | POA: Diagnosis present

## 2023-04-19 DIAGNOSIS — K838 Other specified diseases of biliary tract: Secondary | ICD-10-CM | POA: Diagnosis not present

## 2023-04-19 DIAGNOSIS — Z9981 Dependence on supplemental oxygen: Secondary | ICD-10-CM | POA: Diagnosis not present

## 2023-04-19 DIAGNOSIS — N1832 Chronic kidney disease, stage 3b: Secondary | ICD-10-CM | POA: Diagnosis present

## 2023-04-19 LAB — BASIC METABOLIC PANEL
Anion gap: 8 (ref 5–15)
BUN: 28 mg/dL — ABNORMAL HIGH (ref 8–23)
CO2: 26 mmol/L (ref 22–32)
Calcium: 9.4 mg/dL (ref 8.9–10.3)
Chloride: 102 mmol/L (ref 98–111)
Creatinine, Ser: 1.8 mg/dL — ABNORMAL HIGH (ref 0.44–1.00)
GFR, Estimated: 28 mL/min — ABNORMAL LOW (ref >60)
Glucose, Bld: 106 mg/dL — ABNORMAL HIGH (ref 70–99)
Potassium: 4.3 mmol/L (ref 3.5–5.1)
Sodium: 136 mmol/L (ref 135–145)

## 2023-04-19 MED ORDER — TICAGRELOR 90 MG PO TABS
90.0000 mg | ORAL_TABLET | Freq: Two times a day (BID) | ORAL | Status: DC
  Administered 2023-04-19 – 2023-04-23 (×7): 90 mg via ORAL
  Filled 2023-04-19 (×8): qty 1

## 2023-04-19 MED ORDER — TRAZODONE HCL 50 MG PO TABS: 25.0000 mg | ORAL_TABLET | Freq: Every evening | ORAL | Status: DC | PRN

## 2023-04-19 MED ORDER — TICAGRELOR 90 MG PO TABS: 90.0000 mg | ORAL_TABLET | Freq: Two times a day (BID) | ORAL | Status: DC

## 2023-04-19 MED ORDER — ACETAMINOPHEN 325 MG PO TABS: 650.0000 mg | ORAL_TABLET | ORAL | Status: DC | PRN

## 2023-04-19 MED ORDER — CLOPIDOGREL BISULFATE 75 MG PO TABS: 225.0000 mg | ORAL_TABLET | Freq: Once | ORAL | Status: DC

## 2023-04-19 MED ORDER — ONDANSETRON HCL 4 MG/2ML IJ SOLN: 4.0000 mg | INTRAMUSCULAR | Status: DC | PRN

## 2023-04-19 MED ORDER — ENOXAPARIN SODIUM 30 MG/0.3ML IJ SOSY: 30.0000 mg | PREFILLED_SYRINGE | INTRAMUSCULAR | Status: DC

## 2023-04-19 MED ORDER — ASPIRIN 81 MG PO TBEC: 81.0000 mg | DELAYED_RELEASE_TABLET | Freq: Every day | ORAL | Status: DC

## 2023-04-19 MED ORDER — ADULT MULTIVITAMIN W/MINERALS CH: 1.0000 | ORAL_TABLET | Freq: Every day | ORAL | Status: DC

## 2023-04-19 MED ORDER — ALBUTEROL SULFATE (2.5 MG/3ML) 0.083% IN NEBU: 2.5000 mg | INHALATION_SOLUTION | RESPIRATORY_TRACT | Status: DC | PRN

## 2023-04-19 MED ORDER — MELATONIN 5 MG PO TABS: 2.5000 mg | ORAL_TABLET | Freq: Every day | ORAL | Status: DC

## 2023-04-19 MED ORDER — CALCIUM CARBONATE ANTACID 500 MG PO CHEW: 300.0000 mg | CHEWABLE_TABLET | ORAL | Status: DC

## 2023-04-19 MED ORDER — LAMOTRIGINE 25 MG PO TABS: 25.0000 mg | ORAL_TABLET | Freq: Every day | ORAL | Status: DC

## 2023-04-19 MED ORDER — SENNOSIDES-DOCUSATE SODIUM 8.6-50 MG PO TABS: 1.0000 | ORAL_TABLET | Freq: Every evening | ORAL | Status: DC | PRN

## 2023-04-19 MED ORDER — TICAGRELOR 90 MG PO TABS
180.0000 mg | ORAL_TABLET | Freq: Once | ORAL | Status: AC
  Administered 2023-04-19: 180 mg via ORAL
  Filled 2023-04-19: qty 2

## 2023-04-19 NOTE — Plan of Care (Signed)
  Problem: Education: Goal: Knowledge of disease or condition will improve Outcome: Progressing Goal: Knowledge of secondary prevention will improve (MUST DOCUMENT ALL) Outcome: Progressing Goal: Knowledge of patient specific risk factors will improve Loraine Leriche N/A or DELETE if not current risk factor) Outcome: Progressing   Problem: Ischemic Stroke/TIA Tissue Perfusion: Goal: Complications of ischemic stroke/TIA will be minimized Outcome: Progressing   Problem: Coping: Goal: Will verbalize positive feelings about self Outcome: Progressing Goal: Will identify appropriate support needs Outcome: Progressing   Problem: Health Behavior/Discharge Planning: Goal: Ability to manage health-related needs will improve Outcome: Progressing Goal: Goals will be collaboratively established with patient/family Outcome: Progressing   Problem: Self-Care: Goal: Ability to participate in self-care as condition permits will improve Outcome: Progressing Goal: Verbalization of feelings and concerns over difficulty with self-care will improve Outcome: Progressing Goal: Ability to communicate needs accurately will improve Outcome: Progressing   Problem: Nutrition: Goal: Risk of aspiration will decrease Outcome: Progressing Goal: Dietary intake will improve Outcome: Progressing   Problem: Education: Goal: Knowledge of General Education information will improve Description: Including pain rating scale, medication(s)/side effects and non-pharmacologic comfort measures Outcome: Progressing   Problem: Health Behavior/Discharge Planning: Goal: Ability to manage health-related needs will improve Outcome: Progressing   Problem: Clinical Measurements: Goal: Ability to maintain clinical measurements within normal limits will improve Outcome: Progressing Goal: Will remain free from infection Outcome: Progressing Goal: Diagnostic test results will improve Outcome: Progressing Goal: Respiratory  complications will improve Outcome: Progressing Goal: Cardiovascular complication will be avoided Outcome: Progressing   Problem: Activity: Goal: Risk for activity intolerance will decrease Outcome: Progressing   Problem: Nutrition: Goal: Adequate nutrition will be maintained Outcome: Progressing   Problem: Coping: Goal: Level of anxiety will decrease Outcome: Progressing   Problem: Elimination: Goal: Will not experience complications related to bowel motility Outcome: Progressing Goal: Will not experience complications related to urinary retention Outcome: Progressing   Problem: Pain Management: Goal: General experience of comfort will improve Outcome: Progressing   Problem: Safety: Goal: Ability to remain free from injury will improve Outcome: Progressing   Problem: Skin Integrity: Goal: Risk for impaired skin integrity will decrease Outcome: Progressing   Problem: Education: Goal: Knowledge of patient specific risk factors will improve Loraine Leriche N/A or DELETE if not current risk factor) Outcome: Progressing   Problem: Ischemic Stroke/TIA Tissue Perfusion: Goal: Complications of ischemic stroke/TIA will be minimized Outcome: Progressing   Problem: Coping: Goal: Will verbalize positive feelings about self Outcome: Progressing   Problem: Nutrition: Goal: Dietary intake will improve Outcome: Progressing   Problem: Education: Goal: Knowledge of General Education information will improve Description: Including pain rating scale, medication(s)/side effects and non-pharmacologic comfort measures Outcome: Progressing

## 2023-04-19 NOTE — Progress Notes (Addendum)
Mobility Specialist - Progress Note  During mobility: HR 122, SpO2 90% Post-mobility: HR 120, SPO2 94%   04/19/23 1628  Mobility  Activity Ambulated with assistance in hallway;Ambulated with assistance to bathroom  Level of Assistance Standby assist, set-up cues, supervision of patient - no hands on  Assistive Device Cane;None  Distance Ambulated (ft) 160 ft  Activity Response Tolerated well  $Mobility charge 1 Mobility  Mobility Specialist Start Time (ACUTE ONLY) 1558  Mobility Specialist Stop Time (ACUTE ONLY) 1614  Mobility Specialist Time Calculation (min) (ACUTE ONLY) 16 min   Pt supine upon entry, utilizing RA. Pt agreeable to OOB amb this date, requesting to use the bathroom prior to amb in the hall. Pt completed bed mob ModI, STS SBA and amb to the bathroom-- Pt opted for no AD, furniture cruising noted during amb to the bathroom. Pt amb one lap around the NS with cane w/ supervision, HR 120-122 and O2 90%. During amb Pt used single UE on hallway rail for ~80 ft before returning to use of the cane. Pt expressed feeling SOB ~120 ft into amb, no dizziness. Pt returned to the room, left supine with alarm set and needs within reach. RN notified.  Zetta Bills Mobility Specialist 04/19/23 4:33 PM

## 2023-04-19 NOTE — Progress Notes (Addendum)
Neurology Progress Note  Patient ID: Christine Burnett is a 82 y.o. 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, T9 burst fracture (09/2021), left sacral fracture 5 months ago, osteoporosis, anxiety/depression, remote smoking (61-pack-year history, quit in 2018)   Presents with 2 episodes of left leg weakness (1 on waking, 1 after a tremoring episode), concerning for limb shaking TIA versus focal seizure   Major interval events:  -Vessel imaging highly concerning for severe atherosclerotic disease including severe right carotid disease  Subjective: - No further episodes - Reports some early morning sweats at home, none while here - Looking forward to seeing her grandson, Cephus Slater, over Christmas  Exam: Vitals:   04/19/23 0351 04/19/23 0804  BP: (!) 145/82 (!) 162/100  Pulse: 99 91  Resp: 20 18  Temp: 97.9 F (36.6 C) 98 F (36.7 C)  SpO2: 100% 99%   Gen: In bed, comfortable  Resp: non-labored breathing, no grossly audible wheezing Cardiac: Perfusing extremities well  Abd: soft, nt Psych: Slightly tearful / anxious when discussing procedure but agreeable  Neuro: Mental Status: Patient is awake, alert, oriented to person, place, month, year, and situation. Patient is able to give a clear and coherent history. No signs of aphasia or neglect Cranial Nerves: II: Visual Fields are full. Pupils are equal, round, and reactive to light; some difficulty assessing the left pupil due to the scar tissue but partially visible and it does constrict.   III,IV, VI: EOMI without ptosis or diploplia.  V: Facial sensation is symmetric to light touch VII: Facial movement is symmetric.  VIII: hearing is intact to voice X: Uvula elevates symmetrically XI: Shoulder shrug is  symmetric. XII: tongue is midline without atrophy or fasciculations.  Motor: 5/5 strength was present in all four extremities, except for 4+/5 left hip flexion secondary to her prior hip surgery (actually a little improved from yesterday).  No pronator drift. Sensory: Sensation is symmetric to light touch and temperature in the arms and legs. Plantars: Toes are downgoing bilaterally.  Cerebellar: FNF and HKS are intact bilaterally, within limits of left hip weakness  NIH remains 0  Pertinent Labs:  Basic Metabolic Panel: Recent Labs  Lab 04/17/23 1843 04/19/23 0838  NA 136 136  K 4.0 4.3  CL 103 102  CO2 24 26  GLUCOSE 101* 106*  BUN 27* 28*  CREATININE 1.53* 1.80*  CALCIUM 9.2 9.4    CBC: Recent Labs  Lab 04/17/23 1843  WBC 5.5  HGB 12.1  HCT 36.5  MCV 101.7*  PLT 199   Lab Results  Component Value Date   HGBA1C 5.4 04/17/2023   Lab Results  Component Value Date   LDLCALC 42 04/18/2023     Routine EEG with intermittent slowing, no seizures or epileptiform discharges  ECHO  1. Left ventricular ejection fraction, by estimation, is 60 to 65%. The  left ventricle has normal function. The left ventricle has no regional  wall motion abnormalities. There is mild left ventricular hypertrophy.  Left ventricular diastolic parameters  are consistent with Grade I diastolic dysfunction (impaired relaxation).   2. Right ventricular systolic function is normal. The right ventricular  size is normal.   3. The mitral valve is normal in structure. Mild to moderate mitral valve  regurgitation. No evidence of mitral stenosis.   4.  The aortic valve was not well visualized. Aortic valve regurgitation  is not visualized. Aortic valve sclerosis/calcification is present,  without any evidence of aortic stenosis. Aortic valve area, by VTI  measures 2.03 cm. Aortic valve mean gradient  measures 4.5 mmHg.   5. The inferior vena cava is normal in size with greater than 50%   respiratory variability, suggesting right atrial pressure of 3 mmHg.   CTA personally reviewed, agree with radiology:   1. The infarcts noted on the recent MRI are not apparent on CT. No evidence of new acute infarct. 2. No intracranial large vessel occlusion. 3. 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 4. Poor opacification of the right ICA, which may be related to stenosis at the origin of the brachiocephalic artery. Improved opacification of the right ACA and MCA is likely collateral flow from the posterior communicating artery. 5. Moderate stenosis at the origin of the left vertebral artery, with additional moderate to severe stenosis in the proximal left V4. 6. 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 7. Evaluation of the arch vessel origins is limited by beam hardening artifact from the adjacent contrast bolus, but there is likely at least 50% stenosis at each of the vessel origins.  Impression:   Much more likely to be limb shaking TIA rather than focal seizure, but will continue planned lamotrigine for now for anxiety management as well  A1c and LDL are meeting goal.  Echocardiogram reveals mild left ventricular hypertrophy, grade 1 diastolic dysfunction, mild to moderate mitral valve regurgitation, normal biatrial sizes.  Vessel imaging reveals multifocal atherosclerotic disease.  The most clinically significant disease is poor opacification of the right internal carotid artery, and given this finding I am highly concerned that her left leg weakness spells were limb shaking TIA.  Given the severity of the vessel findings, I do think transfer to [facility] for diagnostic angio with intent to treat (stent) is the most appropriate;  discussed with Dr. Roda Shutters and Dr. Tommie Sams via phone, Dr. Lyn Hollingshead via secure chat, patient in person and daughter [name] via phone  Recommendations: -Brillinta 180 mg load followed by 90 mg BID  -Continue ASA 81 mg -Transfer to [facility] for diagnostic cerebral angiogram with intent to treat with stent -Discontinue buspirone (order discontinued) -Given creatinine clearance is less than 30 and she is older, will very gradually titrate lamotrigine as below Estimated Creatinine Clearance: 26.1 mL/min (A) (by C-G formula based on SCr of 1.53 mg/dL (H)).     Morning  Night  Week 1 and 2   none   25 mg   Week 3 and 4   none   50 mg  Week 5   25 mg   50 mg  Week 6   25 mg   75 mg  Week 7   50 mg   75 mg  Week 8   75 mg   75 mg  Week 9   75 mg  100 mg  Week 10  100 mg  100 mg  -Lamotrigine level should be checked prior to further titration or if patient is having excessive sleepiness prior to week 10 -Stroke team at [facility] to follow on patient arrival  Brooke Dare MD-PhD [facility] [phone]   Greater  than 50 min in care, majority at bedside

## 2023-04-19 NOTE — Progress Notes (Signed)
PROGRESS NOTE    Christine Burnett   YNW:[id number] DOB: 09/12/1940  DOA: 04/17/2023 Date of Service: 04/19/23 which is hospital day 0  PCP: Danelle Berry, PA-C    HPI: Christine Burnett is a 82 y.o. 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 However her symptoms gradually improved and she was back at her baseline. Later that day she was at a store and noticed uncontrollable twitching of her left leg which started in her groin and then progressed down the leg, lasting a few minutes and then resolving. Postevent she had weakness in that leg which gradually resolved again. She came into the ED for further evaluation.   Hospital course / significant events:  11/12: admitted to hospitalist service for TIA w/u. MRI brain concerning for possible tiny acute ischemic infarcts and chronic hemorrhage in the anterior right frontal lobe, fairly cortical. Pending Echo and carotid US.  11/13: neurology consult - Per neurology, punctate possible strokes on MRI do not fit the clinical history provided, therefore favor these to be artifact and proceeding w/ EEG, MRA H/N. Echo no evidence for LA enlargement no need for Zio monitor. Seizure precautions, initiating lamotrigine and d/c BuSpar.  11/14: substantial atherosclerotic disease on CTA H/N. The most clinically significant is poor opacification of the right internal carotid artery, and given this finding neurology team is highly concerned that her left leg weakness spells were limb shaking TIA. 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  Vascular sugery consult  See below re: other TIA w/u and neuro recs   TIA, possible seizure-like activity but more concern for limb-shaking TIA Echo no LA enlargement, Afib less likely  EEG nonfocal CTA H/N see below - concern for R ICA occlusion D/c BuSpar Continue on Lamotrigine  Seizure precautions  Statin ASA decide whether to continue Plavix for 21 to 90-day course per neurology Vascular surgery consult to address ICA   AKI on CKD3b Stable/baseline Caution w/ contrast studies but need CTA H/N for diagnostic/prognostic purposes Follow BMP in AM --> AKI (Cr 1.5 to 1.8), caution w/ further contrast   Chest pain - resolved Repeat troponin / EKG as needed   Essential hypertension antihypertensives with permissive parameters.   Dyslipidemia Statin Lipid panel   Anxiety and depression D/c BuSpar and starting Lamotrigine per neurology  Continue Paxil   Chronic obstructive pulmonary disease (COPD) (HCC) Albuterol prn     Normal weight based on BMI: Body mass index is 22.06 kg/m.  Underweight - under 18.5  normal weight - 18.5 to 24.9 overweight - 25 to 29.9 obese - 30 or more   DVT prophylaxis: ambulation IV fluids: no continuous IV fluids  Nutrition: regular Central lines / invasive devices: none  Code Status: FULL CODE ACP documentation reviewed: 04/18/23 and none on file in [facility]  TOC needs: none at this time Barriers to dispo / significant pending items: EEG, CTA, monitor renal fxn, may need  further consultation/intervention pending results              Subjective / Brief ROS:  Patient reports doing well this morning No new weakness  Denies CP/SOB.  Pain controlled.   Tolerating diet.  Reports no concerns w/ urination/defecation.   Family  Communication:neurology has d/w family     Objective Findings:  Vitals:   04/18/23 1947 04/19/23 0019 04/19/23 0351 04/19/23 0804  BP: (!) 177/86 (!) 160/96 (!) 145/82 (!) 162/100  Pulse: 96 92 99 91  Resp: 17 18 20 18   Temp: 98.2 F (36.8 C) 98.2 F (36.8 C) 97.9 F (36.6 C) 98 F (36.7 C)  TempSrc:      SpO2: 97% 94% 100% 99%  Weight:      Height:        Intake/Output Summary (Last 24 hours) at 04/19/2023 1225 Last data filed at 04/19/2023 1050 Gross per 24 hour  Intake 740 ml  Output --  Net 740 ml   Filed Weights   04/18/23 0039  Weight: 62 kg    Examination:  Physical Exam Constitutional:      General: She is not in acute distress. Cardiovascular:     Rate and Rhythm: Normal rate and regular rhythm.  Pulmonary:     Effort: Pulmonary effort is normal.     Breath sounds: Normal breath sounds. Wheezes: scattered, mild.  Musculoskeletal:     Right lower leg: No edema.     Left lower leg: No edema.  Neurological:     General: No focal deficit present.     Mental Status: She is alert and oriented to person, place, and time. Mental status is at baseline.  Psychiatric:        Mood and Affect: Mood normal.        Behavior: Behavior normal.          Scheduled Medications:   aspirin EC  81 mg Oral QHS   atorvastatin  40 mg Oral QHS   calcium carbonate  300 mg of elemental calcium Oral Q MTWThF   cholecalciferol  2,000 Units Oral QHS   enoxaparin (LOVENOX) injection  30 mg Subcutaneous Q24H   lamoTRIgine  25 mg Oral QHS   melatonin  2.5 mg Oral QHS   multivitamin with minerals  1 tablet Oral Daily   PARoxetine  40 mg Oral q morning   ticagrelor  180 mg Oral Once   Followed by   ticagrelor  90 mg Oral BID    Continuous Infusions:   PRN Medications:  acetaminophen **OR** acetaminophen (TYLENOL) oral liquid 160 mg/5 mL **OR** acetaminophen, albuterol, ondansetron (ZOFRAN) IV, senna-docusate, traZODone  Antimicrobials from admission:   Anti-infectives (From admission, onward)    None           Data Reviewed:  I have personally reviewed the following...  CBC: Recent Labs  Lab 04/17/23 1843  WBC 5.5  HGB 12.1  HCT 36.5  MCV 101.7*  PLT 199   Basic Metabolic Panel: Recent Labs  Lab 04/17/23 1843 04/19/23 0838  NA 136 136  K 4.0 4.3  CL 103 102  CO2 24 26  GLUCOSE 101* 106*  BUN 27* 28*  CREATININE 1.53* 1.80*  CALCIUM 9.2 9.4   GFR: Estimated Creatinine Clearance: 22.2 mL/min (A) (by C-G formula based on SCr of 1.8 mg/dL (H)). Liver Function Tests: No results for input(s): "AST", "ALT", "ALKPHOS", "BILITOT", "PROT", "ALBUMIN" in the last 168 hours. No results for input(s): "LIPASE", "AMYLASE" in the  last 168 hours. No results for input(s): "AMMONIA" in the last 168 hours. Coagulation Profile: No results for input(s): "INR", "PROTIME" in the last 168 hours. Cardiac Enzymes: No results for input(s): "CKTOTAL", "CKMB", "CKMBINDEX", "TROPONINI" in the last 168 hours. BNP (last 3 results) No results for input(s): "PROBNP" in the last 8760 hours. HbA1C: Recent Labs    04/17/23 1843  HGBA1C 5.4   CBG: No results for input(s): "GLUCAP" in the last 168 hours. Lipid Profile: Recent Labs    04/18/23 0454  CHOL 103  HDL 42  LDLCALC 42  TRIG 95  CHOLHDL 2.5   Thyroid Function Tests: No results for input(s): "TSH", "T4TOTAL", "FREET4", "T3FREE", "THYROIDAB" in the last 72 hours. Anemia Panel: No results for input(s): "VITAMINB12", "FOLATE", "FERRITIN", "TIBC", "IRON", "RETICCTPCT" in the last 72 hours. Most Recent Urinalysis On File:     Component Value Date/Time   COLORURINE YELLOW (A) 04/17/2023 2133   APPEARANCEUR CLEAR (A) 04/17/2023 2133   APPEARANCEUR Clear 01/28/2016 0841   LABSPEC 1.013 04/17/2023 2133   PHURINE 6.0 04/17/2023 2133   GLUCOSEU NEGATIVE 04/17/2023 2133   HGBUR NEGATIVE 04/17/2023 2133   BILIRUBINUR NEGATIVE 04/17/2023 2133   BILIRUBINUR neg 07/29/2019 1541    BILIRUBINUR Negative 01/28/2016 0841   KETONESUR NEGATIVE 04/17/2023 2133   PROTEINUR NEGATIVE 04/17/2023 2133   UROBILINOGEN 0.2 07/29/2019 1541   NITRITE NEGATIVE 04/17/2023 2133   LEUKOCYTESUR NEGATIVE 04/17/2023 2133   Sepsis Labs: @LABRCNTIP (procalcitonin:4,lacticidven:4) Microbiology: No results found for this or any previous visit (from the past 240 hour(s)).    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 EEG to evaluate for seizure Level of alertness: Awake, asleep AEDs during EEG study: None Technical aspects: This EEG study was done with scalp electrodes positioned according to the 10-20 International system of electrode placement. Electrical activity was reviewed with band pass filter of 1-70Hz , sensitivity of 7 uV/mm, display speed of 81mm/sec with a 60Hz  notched filter applied as appropriate. EEG data were recorded continuously and digitally stored.  Video monitoring was available and reviewed as appropriate. Description: The posterior dominant rhythm consists of 8 Hz activity of moderate voltage (25-35 uV) seen predominantly in posterior head regions, symmetric and reactive to eye opening and eye closing. Sleep was characterized by vertex waves, sleep spindles (12 to 14 Hz), maximal frontocentral region. EEG showed intermittent generalized 3 to 6 Hz theta-delta slowing. Hyperventilation and photic stimulation were not performed.   ABNORMALITY - Intermittent slow, generalized IMPRESSION: This study is suggestive of mild diffuse encephalopathy. No seizures or epileptiform discharges were seen throughout the recording.  Charlsie Quest   CT ANGIO HEAD NECK W WO CM  Result Date: 04/18/2023 CLINICAL DATA:  Abnormality in the basilar artery on MRA, possible acute infarcts on MRI EXAM: CT ANGIOGRAPHY HEAD AND NECK WITH AND WITHOUT CONTRAST TECHNIQUE: Multidetector CT imaging of the head and neck was performed using the standard protocol during bolus administration of intravenous contrast. Multiplanar CT image reconstructions and MIPs were obtained to evaluate the vascular anatomy. Carotid stenosis measurements (when applicable) are obtained utilizing NASCET criteria, using the distal internal carotid diameter as the denominator. RADIATION DOSE REDUCTION: This exam was  performed according to the departmental dose-optimization program which includes automated exposure control, adjustment of the mA and/or kV according to patient size and/or use of iterative reconstruction technique. CONTRAST:  75mL OMNIPAQUE IOHEXOL 350 MG/ML SOLN COMPARISON:  07/03/2014 CTA and, correlation is made with 04/18/2023 MRI head and MRA head and 04/17/2023 CT head FINDINGS: CT HEAD FINDINGS Brain: The infarcts noted on the recent MRI are not apparent on CT. No evidence of new acute infarct, hemorrhage, mass, mass effect, or midline shift. No hydrocephalus or extra-axial fluid collection. Periventricular white matter changes, likely the sequela of chronic small vessel ischemic disease. Vascular: No hyperdense vessel. Atherosclerotic calcifications in the intracranial carotid and vertebral arteries. Skull: Negative for fracture or focal lesion. Sinuses/Orbits: Mucosal thickening in the ethmoid air cells. No acute finding in the orbits. Other: The mastoid air cells are well aerated. CTA NECK FINDINGS Aortic arch: Standard branching. Imaged portion shows no evidence of aneurysm or dissection. Evaluation of the arch vessel origins is limited by beam hardening artifact from the adjacent contrast bolus, but there is at least 50% stenosis at each of the vessel  origins. Right carotid system: No evidence of dissection, occlusion, or hemodynamically significant stenosis (greater than 50%). Atherosclerotic disease at the bifurcation and in the proximal and distal ICA is not hemodynamically significant. Comparatively poor opacification of the right ICA compared to the left may be related to the degree of stenosis at the origin of the brachiocephalic artery. Left carotid system: No evidence of dissection, occlusion, or hemodynamically significant stenosis (greater than 50%). Atherosclerotic disease at the bifurcation and in the proximal and distal ICA is not hemodynamically significant. Vertebral arteries: Poor visualization of the origin of the right vertebral artery secondary to the adjacent contrast bolus. Within that limitation, there is likely severe stenosis at the origin of the right vertebral artery, which is very poorly opacified until the V3 segment (series 10, image 27). Moderate stenosis at the origin of left vertebral artery, which is non dominant. The left vertebral artery is patent through the skull base. No evidence of dissection. Skeleton: No acute osseous abnormality. Degenerative changes in the cervical spine. Other neck: No acute finding. Upper chest: Emphysema. No focal pulmonary opacity or pleural effusion. Review of the MIP images confirms the above findings CTA HEAD FINDINGS Anterior circulation: Both internal carotid arteries are patent to the termini, with calcifications but without significant stenosis, although the right ICA is poorly opacified. 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 A1 segments patent. Normal anterior communicating artery. Anterior cerebral arteries are patent to their distal aspects without significant stenosis. No M1 stenosis or occlusion. MCA branches perfused to their  distal aspects without significant stenosis. Opacification of the right ACA and MCA is improved compared to the right ICA, likely reflecting collateral flow from the right posterior communicating artery. Posterior circulation: Moderate to severe stenosis in the proximal left V4 (series 10, images 155, 152, and 145). The left vertebral artery primarily supplies the left PICA, which is patent. Mild stenosis in the proximal right V4, which is otherwise patent to the vertebrobasilar junction. The right PICA is also patent. Given poor opacification of the extracranial V4, this is likely retrograde. Basilar patent to its distal aspect without significant stenosis. Superior cerebellar arteries patent proximally. Patent P1 segments. The bilateral posterior communicating arteries are patent. PCAs perfused to their  distal aspects without significant stenosis. Venous sinuses: As permitted by contrast timing, patent. Anatomic variants: None significant. No evidence of aneurysm or vascular malformation. Review of the MIP images confirms the above findings IMPRESSION: 1. The infarcts noted on the recent MRI are not apparent on CT. No evidence of new acute infarct. 2. No intracranial large vessel occlusion. 3. 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 4. Poor opacification of the right ICA, which may be related to stenosis at the origin of the brachiocephalic artery. Improved opacification of the right ACA and MCA is likely collateral flow from the posterior communicating artery. 5. Moderate stenosis at the origin of the left vertebral artery, with additional moderate to severe stenosis in the proximal left V4. 6. 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 7. Evaluation of the arch vessel origins is  limited by beam hardening artifact from the adjacent contrast bolus, but there is likely at least 50% stenosis at each of the vessel origins. \ Electronically Signed   By: Wiliam Ke M.D.   On: 04/18/2023 19:41   US Carotid Bilateral  Result Date: 04/18/2023 CLINICAL DATA:  TIA Hyperlipidemia Former tobacco user EXAM: BILATERAL CAROTID DUPLEX ULTRASOUND TECHNIQUE: Wallace Cullens scale imaging, color Doppler and duplex ultrasound were performed of bilateral carotid and vertebral arteries in the neck. COMPARISON:  07/29/2018 FINDINGS: Criteria: Quantification of carotid stenosis is based on velocity parameters that correlate the residual internal carotid diameter with NASCET-based stenosis levels, using the diameter of the distal internal carotid lumen as the denominator for stenosis measurement. The following velocity measurements were obtained: RIGHT ICA: 39/4 cm/sec CCA: 18/11 cm/sec SYSTOLIC ICA/CCA RATIO:  2.2 ECA: 50 cm/sec LEFT ICA: 242/36 cm/sec CCA: 77/15 cm/sec SYSTOLIC ICA/CCA RATIO:  3.1 ECA: 156 cm/sec RIGHT CAROTID ARTERY: Mild calcified plaque of the distal common carotid artery. Common carotid artery waveform is abnormal and monophasic. Internal carotid artery waveform is abnormal with reversal of systolic flow. RIGHT VERTEBRAL ARTERY:  Bidirectional flow. LEFT CAROTID ARTERY: Bulky calcified plaque noted at the distal left common and proximal internal carotid arteries. Common carotid artery waveform is abnormal and monophasic. LEFT VERTEBRAL ARTERY:  Antegrade flow. Right upper extremity blood pressure is significantly lower than left with right side measuring 130/98 mm Hg and left measuring 164/82 mm Hg. IMPRESSION: 1. Monophasic waveform in the right common carotid artery and reversal of systolic flow in right internal carotid artery consistent with significant proximal stenosis. The site of significant origin of the right brachiocephalic artery asymmetrically decreased right upper extremity blood  pressure as well as bidirectional flow in the right vertebral artery. 2. Monophasic waveform in the left common carotid artery consistent with significant stenosis at the origin of the common carotid artery. 3. 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 4. Further evaluation with CT angiography of the neck is recommended. Electronically Signed   By: Acquanetta Belling M.D.   On: 04/18/2023 15:32   MR ANGIO HEAD WO CONTRAST  Result Date: 04/18/2023 CLINICAL DATA:  Neuro deficit, acute, stroke suspected EXAM: MRA HEAD WITHOUT CONTRAST TECHNIQUE: Angiographic images of the Circle of Willis were acquired using MRA technique without intravenous contrast. COMPARISON:  Same day brain MRI FINDINGS: Anterior circulation: Asymmetrically decreased flow void in the right petrous,  cavernous, and supraclinoid ICA is worrisome for interval development of a new right ICA occlusion. Normal flow void was visualized on same day brain MRI. There is reconstitution of the level of the ICA terminus via collaterals from the circle-of-Willis. There is normal arborization in the bilateral MCA territories. Posterior circulation: There is nearly absent flow void in the basilar artery with robust PCOM bilaterally. P2 and P3 segments demonstrate normal flow signal. Anatomic variants: Fetal PCAs bilaterally. Other: None. IMPRESSION: 1. Asymmetrically decreased flow void in the right petrous, cavernous, and supraclinoid ICA is worrisome for interval development of a new right ICA occlusion. There is reconstitution of the level of the ICA terminus via collaterals from the circle-of-Willis. Recommend further evaluation with a CT head and neck angiogram 2. Nearly absent flow void in the basilar artery with robust PCOM bilaterally. 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 Recommend attention on CTA. These results will be called to the ordering clinician or representative by  the Radiologist Assistant, and communication documented in the PACS or [employer]. Electronically Signed   By: Lorenza Cambridge M.D.   On: 04/18/2023 14:48   ECHOCARDIOGRAM COMPLETE BUBBLE STUDY  Result Date: 04/18/2023    ECHOCARDIOGRAM REPORT   Patient Name:   AZIRAH PERKOWSKI Date of Exam: 04/18/2023 Medical Rec #:  [id number]          Height:       66.0 in Accession #:    [id number]         Weight:       136.7 lb Date of Birth:  06-25-1940          BSA:          1.701 m Patient Age:    82 years           BP:           149/74 mmHg Patient Gender: F                  HR:           93 bpm. Exam Location:  [facility] Procedure: 2D Echo, Cardiac Doppler, Color Doppler and Saline Contrast Bubble            Study Indications:     TIA 435.9 / G45.9  History:         Patient has prior history of Echocardiogram examinations, most                  recent 01/26/2021. COPD, TIA and Stroke; Risk                  Factors:Dyslipidemia.  Sonographer:     Cristela Blue Referring Phys:  [id number] JAN A MANSY Diagnosing Phys: Julien Nordmann MD IMPRESSIONS  1. Left ventricular ejection fraction, by estimation, is 55 to 60%. The left ventricle has normal function. The left ventricle has no regional wall motion abnormalities. Left ventricular diastolic parameters are consistent with Grade I diastolic dysfunction (impaired relaxation).  2. Right ventricular systolic function is normal. The right ventricular size is normal. There is normal pulmonary artery systolic pressure. The estimated right ventricular systolic pressure is 33.3 mmHg.  3. The mitral valve is normal in structure. Moderate mitral valve regurgitation. No evidence of mitral stenosis.  4. Tricuspid valve regurgitation is moderate.  5. The aortic valve is normal in structure. Aortic valve regurgitation is mild. Aortic valve sclerosis/calcification is present, without any evidence of aortic stenosis.  6. The inferior  vena cava is normal in size with greater than 50% respiratory  variability, suggesting right atrial pressure of 3 mmHg.  7. Agitated saline contrast bubble study was negative, with no evidence of any interatrial shunt. FINDINGS  Left Ventricle: Left ventricular ejection fraction, by estimation, is 55 to 60%. The left ventricle has normal function. The left ventricle has no regional wall motion abnormalities. The left ventricular internal cavity size was normal in size. There is  no left ventricular hypertrophy. Left ventricular diastolic parameters are consistent with Grade I diastolic dysfunction (impaired relaxation). Right Ventricle: The right ventricular size is normal. No increase in right ventricular wall thickness. Right ventricular systolic function is normal. There is normal pulmonary artery systolic pressure. The tricuspid regurgitant velocity is 2.66 m/s, and  with an assumed right atrial pressure of 5 mmHg, the estimated right ventricular systolic pressure is 33.3 mmHg. Left Atrium: Left atrial size was normal in size. Right Atrium: Right atrial size was normal in size. Pericardium: There is no evidence of pericardial effusion. Mitral Valve: The mitral valve is normal in structure. There is mild calcification of the mitral valve leaflet(s). Moderate mitral valve regurgitation. No evidence of mitral valve stenosis. MV peak gradient, 10.4 mmHg. The mean mitral valve gradient is 4.0 mmHg. Tricuspid Valve: The tricuspid valve is normal in structure. Tricuspid valve regurgitation is moderate . No evidence of tricuspid stenosis. Aortic Valve: The aortic valve is normal in structure. Aortic valve regurgitation is mild. Aortic valve sclerosis/calcification is present, without any evidence of aortic stenosis. Aortic valve mean gradient measures 4.2 mmHg. Aortic valve peak gradient measures 7.0 mmHg. Aortic valve area, by VTI measures 2.26 cm. Pulmonic Valve: The pulmonic valve was normal in structure. Pulmonic valve regurgitation is not visualized. No evidence of pulmonic  stenosis. Aorta: The aortic root is normal in size and structure. Venous: The inferior vena cava is normal in size with greater than 50% respiratory variability, suggesting right atrial pressure of 3 mmHg. IAS/Shunts: No atrial level shunt detected by color flow Doppler. Agitated saline contrast was given intravenously to evaluate for intracardiac shunting. Agitated saline contrast bubble study was negative, with no evidence of any interatrial shunt. There  is no evidence of a patent foramen ovale. There is no evidence of an atrial septal defect.  LEFT VENTRICLE PLAX 2D LVIDd:         4.10 cm   Diastology LVIDs:         2.40 cm   LV e' medial:    5.11 cm/s LV PW:         1.00 cm   LV E/e' medial:  13.5 LV IVS:        1.00 cm   LV e' lateral:   5.98 cm/s LVOT diam:     2.00 cm   LV E/e' lateral: 11.6 LV SV:         59 LV SV Index:   35 LVOT Area:     3.14 cm  RIGHT VENTRICLE RV Basal diam:  2.20 cm RV Mid diam:    1.50 cm LEFT ATRIUM           Index        RIGHT ATRIUM          Index LA diam:      2.40 cm 1.41 cm/m   RA Area:     9.17 cm LA Vol (A2C): 18.4 ml 10.82 ml/m  RA Volume:   16.70 ml 9.82 ml/m LA Vol (A4C): 26.5 ml  15.58 ml/m  AORTIC VALVE AV Area (Vmax):    1.84 cm AV Area (Vmean):   1.76 cm AV Area (VTI):     2.26 cm AV Vmax:           132.50 cm/s AV Vmean:          97.350 cm/s AV VTI:            0.260 m AV Peak Grad:      7.0 mmHg AV Mean Grad:      4.2 mmHg LVOT Vmax:         77.40 cm/s LVOT Vmean:        54.600 cm/s LVOT VTI:          0.187 m LVOT/AV VTI ratio: 0.72  AORTA Ao Root diam: 2.90 cm MITRAL VALVE                TRICUSPID VALVE MV Area (PHT): 8.62 cm     TR Peak grad:   28.3 mmHg MV Area VTI:   2.27 cm     TR Vmax:        266.00 cm/s MV Peak grad:  10.4 mmHg MV Mean grad:  4.0 mmHg     SHUNTS MV Vmax:       1.61 m/s     Systemic VTI:  0.19 m MV Vmean:      93.8 cm/s    Systemic Diam: 2.00 cm MV Decel Time: 88 msec MV E velocity: 69.20 cm/s MV A velocity: 123.00 cm/s MV E/A ratio:   0.56 Julien Nordmann MD Electronically signed by Julien Nordmann MD Signature Date/Time: 04/18/2023/11:16:32 AM    Final    MR BRAIN WO CONTRAST  Result Date: 04/18/2023 CLINICAL DATA:  Initial evaluation for acute TIA. EXAM: MRI HEAD WITHOUT CONTRAST TECHNIQUE: Multiplanar, multiecho pulse sequences of the brain and surrounding structures were obtained without intravenous contrast. COMPARISON:  Prior CT from 04/17/2023. FINDINGS: Brain: Diffuse prominence of the CSF containing spaces compatible generalized cerebral atrophy. Patchy and confluent T2/FLAIR hyperintensity involving the periventricular and deep white matter both cerebral hemispheres as well as the pons, most likely related to chronic microvascular ischemic disease, advanced in nature. Small remote bilateral cerebellar infarcts, left greater than right. There is question of a punctate focus of restricted diffusion positioned along the midline of the splenium, seen only on coronal DWI sequence (series 18, image 24). Additionally, there is a questionable punctate focus of diffusion signal abnormality within the right cerebellar hemisphere as well (series 9, image 62). While these findings could be artifactual in nature, possible tiny acute ischemic infarcts are difficult to exclude, and could be considered in correct clinical setting. No associated hemorrhage. No other evidence for acute or subacute ischemia. Gray-white matter differentiation otherwise maintained. No acute intracranial hemorrhage. Small focus of chronic hemosiderin staining noted at the high anterior right frontal lobe (series 14, image 50), suggesting prior hemorrhage at this location. No mass lesion, midline shift or mass effect. No hydrocephalus or extra-axial fluid collection. Pituitary gland suprasellar region within normal limits. Vascular: Major intracranial vascular flow voids are maintained. Skull and upper cervical spine: Craniocervical junctional limits. Bone marrow signal  intensity heterogeneous but overall within normal limits. No scalp soft tissue abnormality. Sinuses/Orbits: Globes and orbital soft tissues within normal limits. Scattered postop thickening present about the ethmoidal air cells. Paranasal sinuses are otherwise clear. No mastoid effusion. Other: None. IMPRESSION: 1. Question punctate foci of restricted diffusion involving the mid splenium and within the right cerebellar  hemisphere as above. While these findings could be artifactual in nature, possible tiny acute ischemic infarcts are difficult to exclude, and could be considered in correct clinical setting. No associated hemorrhage. 2. 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 Electronically Signed   By: Rise Mu M.D.   On: 04/18/2023 02:09   CT HEAD WO CONTRAST  Result Date: 04/17/2023 CLINICAL DATA:  A shin for acute blurry vision, speech difficulty, left lower extremity weakness. EXAM: CT HEAD WITHOUT CONTRAST TECHNIQUE: Contiguous axial images were obtained from the base of the skull through the vertex without intravenous contrast. RADIATION DOSE REDUCTION: This exam was performed according to the departmental dose-optimization program which includes automated exposure control, adjustment of the mA and/or kV according to patient size and/or use of iterative reconstruction technique. COMPARISON:  Prior study from 07/13/2020. FINDINGS: Brain: Moderately advanced cerebral atrophy. 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 Remote left cerebellar infarct noted. No acute intracranial hemorrhage. No acute large vessel territory infarct. No mass lesion or midline shift. No hydrocephalus or extra-axial fluid collection. Vascular: No abnormal hyperdense vessel. Calcified  atherosclerosis present at the skull base. Skull: Scalp soft tissues demonstrate no acute finding. Calvarium intact. Sinuses/Orbits: Globes orbital soft tissues within normal limits. Mild mucosal thickening present about the left ethmoidal air cells. Visualized paranasal sinuses are otherwise clear. No mastoid effusion. Other: None. IMPRESSION: 1. No acute intracranial abnormality. 2. Moderately advanced cerebral atrophy with advanced chronic microvascular ischemic disease. 3. Remote left cerebellar infarct. Electronically Signed   By: Rise Mu M.D.   On: 04/17/2023 20:01       Time spent: 50 min    Sunnie Nielsen, DO [facility] 04/19/2023, 12:25 PM    Dictation software may have been used to generate the above note. Typos may occur and escape review in typed/dictated notes. Please contact Dr Lyn Hollingshead directly for clarity if needed.  Staff may message me via secure chat in Epic  but this may not receive an immediate response,  please page me for urgent matters!  If 7PM-7AM, please contact night coverage [email or web address]

## 2023-04-19 NOTE — Discharge Summary (Deleted)
Physician Discharge Summary   Patient: Christine Burnett MRN: [id number]  DOB: 1941/01/31   Admit:     Date of Admission: 04/17/2023 Admitted from: home   Discharge: Date of discharge: 04/19/23 Disposition:  TRANSFER TO [place] FOR SPECIALIZED INTERVENTION  Condition at discharge: stable  CODE STATUS: FULL CODE      Discharge Physician: Sunnie Nielsen, DO [facility]     PCP: Danelle Berry, PA-C  Recommendations for Outpatient Follow-up:  PENDING CLINICAL COURSE        Discharge Diagnoses: Principal Problem:   Stenosis of right internal carotid artery Active Problems:   TIA (transient ischemic attack)   Chest pain   Essential hypertension   Dyslipidemia   Anxiety and depression   Chronic obstructive pulmonary disease (COPD) (HCC)       HPI: Christine Burnett is a 82 y.o. 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 However her symptoms gradually improved and she was back at her baseline. Later that day she was at a store and noticed uncontrollable twitching of her left leg which started in her groin and then progressed down the leg, lasting a few minutes and then resolving. Postevent she had weakness in that leg which gradually resolved again. She came into the ED for further evaluation.   Hospital course / significant events:  11/12: admitted to hospitalist service for TIA w/u. MRI brain concerning for possible tiny acute ischemic infarcts and chronic hemorrhage in the anterior right frontal  lobe, fairly cortical. Pending Echo and carotid US.  11/13: neurology consult - Per neurology, punctate possible strokes on MRI do not fit the clinical history provided, therefore favor these to be artifact and proceeding w/ EEG, MRA H/N. Echo no evidence for LA enlargement no need for Zio monitor. Seizure precautions, initiating lamotrigine and d/c BuSpar.  11/14: substantial atherosclerotic disease on CTA H/N. The most clinically significant is poor opacification of the right internal carotid artery, and given this finding neurology team is highly concerned that her left leg weakness spells were limb shaking TIA. [facility] Neurology (Dr. Iver Nestle) has d/w [facility] Neurology (Dr. Roda Shutters) as well as IR (Dr. Tommie Sams) and recs for transfer to [facility] for diagnostic cerebral angiogram with intent to treat with stent.   Consultants:  Neurology   Procedures/Surgeries: none      ASSESSMENT & PLAN:   R ICA stenosis possible critical  Transfer to [facility] for diagnostic cerebral angiogram with intent to treat with stent.  See below re: other TIA w/u and neuro recs   TIA, possible seizure-like activity but more concern for limb-shaking TIA Echo no LA enlargement, Afib less likely  EEG nonfocal CTA H/N see below - concern for R ICA occlusion D/w neurology 04/18/23 4:45 PM and will continue to follow results, may need to involve neurosurgery / IR depending on imaging  D/c BuSpar Continue on Lamotrigine  Seizure precautions  Statin ASA decide whether to continue Plavix for 21 to 90-day course per neurology Transfer to [facility] for diagnostic cerebral angiogram with intent to treat with stent. Marland Kitchen   AKI  on CKD3b Stable/baseline Caution w/ contrast studies but need CTA H/N for diagnostic/prognostic purposes Follow BMP in AM --> AKI (Cr 1.5 to 1.8), caution w/ further contrast   Chest pain - resolved Repeat troponin / EKG as needed   Essential hypertension antihypertensives with  permissive parameters.   Dyslipidemia Statin Lipid panel   Anxiety and depression D/c BuSpar and starting Lamotrigine per neurology  Continue Paxil   Chronic obstructive pulmonary disease (COPD) (HCC) Albuterol prn     Normal weight based on BMI: Body mass index is 22.06 kg/m.  Underweight - under 18.5  normal weight - 18.5 to 24.9 overweight - 25 to 29.9 obese - 30 or more   DVT prophylaxis: ambulation IV fluids: no continuous IV fluids  Nutrition: regular diet Central lines / invasive devices: none  Code Status: FULL CODE ACP documentation reviewed: 04/18/23 and none on file in [facility]  TOC needs: none at this time Barriers to dispo / significant pending items: intervention at [facility] - transfer is pending             Discharge Instructions  Allergies as of 04/19/2023       Reactions   Zithromax [azithromycin]    Cefuroxime Axetil Rash   Ciprofloxacin Nausea Only   Unknown   Codeine Other (See Comments)   Patient does not like how the medication makes you feel         Medication List     STOP taking these medications    acetaminophen 650 MG CR tablet Commonly known as: TYLENOL Replaced by: acetaminophen 325 MG tablet   albuterol 108 (90 Base) MCG/ACT inhaler Commonly known as: Ventolin HFA Replaced by: albuterol (2.5 MG/3ML) 0.083% nebulizer solution   busPIRone 5 MG tablet Commonly known as: BUSPAR   Calcium 200 MG Tabs Replaced by: calcium carbonate 500 MG chewable tablet   fluticasone furoate-vilanterol 100-25 MCG/ACT Aepb Commonly known as: BREO ELLIPTA   multivitamin capsule Replaced by: multivitamin with minerals Tabs tablet       TAKE these medications    acetaminophen 325 MG tablet Commonly known as: TYLENOL Take 2 tablets (650 mg total) by mouth every 4 (four) hours as needed for mild pain (pain score 1-3) (or temp > 37.5 C (99.5 F)). Replaces: acetaminophen 650 MG CR tablet   albuterol (2.5 MG/3ML) 0.083% nebulizer  solution Commonly known as: PROVENTIL Take 3 mLs (2.5 mg total) by nebulization every 4 (four) hours as needed for shortness of breath or wheezing. Replaces: albuterol 108 (90 Base) MCG/ACT inhaler   aspirin EC 81 MG tablet Take 1 tablet (81 mg total) by mouth at bedtime. Swallow whole.   atorvastatin 40 MG tablet Commonly known as: LIPITOR Take 1 tablet (40 mg total) by mouth at bedtime.   calcium carbonate 500 MG chewable tablet Commonly known as: TUMS - dosed in mg elemental calcium Chew 1.5 tablets (300 mg of elemental calcium total) by mouth every Monday, Tuesday, Wednesday, Thursday, and Friday. Replaces: Calcium 200 MG Tabs   enoxaparin 30 MG/0.3ML injection Commonly known as: LOVENOX Inject 0.3 mLs (30 mg total) into the skin daily. Start taking on: April 20, 2023   lamoTRIgine 25 MG tablet Commonly known as: LAMICTAL Take 1 tablet (25 mg total) by mouth at bedtime.   melatonin 5 MG Tabs Take 0.5 tablets (2.5 mg total) by mouth at bedtime. What changed:  medication strength how much to take   multivitamin with minerals Tabs tablet Take 1 tablet by mouth daily. Start taking on:  April 20, 2023 Replaces: multivitamin capsule   ondansetron 4 MG/2ML Soln injection Commonly known as: ZOFRAN Inject 2 mLs (4 mg total) into the vein every 4 (four) hours as needed for nausea or vomiting.   PARoxetine 40 MG tablet Commonly known as: PAXIL Take 1 tablet (40 mg total) by mouth every morning.   senna-docusate 8.6-50 MG tablet Commonly known as: Senokot-S Take 1 tablet by mouth at bedtime as needed for moderate constipation.   ticagrelor 90 MG Tabs tablet Commonly known as: BRILINTA Take 1 tablet (90 mg total) by mouth 2 (two) times daily.   traZODone 50 MG tablet Commonly known as: DESYREL Take 0.5 tablets (25 mg total) by mouth at bedtime as needed for sleep.   Vitamin D3 50 MCG (2000 UT) Tabs Take 2,000 Units by mouth at bedtime.          Allergies   Allergen Reactions   Zithromax [Azithromycin]    Cefuroxime Axetil Rash   Ciprofloxacin Nausea Only    Unknown   Codeine Other (See Comments)    Patient does not like how the medication makes you feel      Subjective: Pt reports feeling well this morning, no further episodes    Discharge Exam: BP (!) 162/100 (BP Location: Left Arm)   Pulse 91   Temp 98 F (36.7 C)   Resp 18   Ht 5\' 6"  (1.676 m)   Wt 62 kg   SpO2 99%   BMI 22.06 kg/m  General: Pt is alert, awake, not in acute distress Cardiovascular: RRR, S1/S2 +, no rubs, no gallops Respiratory: CTA bilaterally, no wheezing, no rhonchi Abdominal: Soft, NT, ND, bowel sounds + Extremities: no edema, no cyanosis     The results of significant diagnostics from this hospitalization (including imaging, microbiology, ancillary and laboratory) are listed below for reference.     Microbiology: No results found for this or any previous visit (from the past 240 hour(s)).   Labs: BNP (last 3 results) No results for input(s): "BNP" in the last 8760 hours. Basic Metabolic Panel: Recent Labs  Lab 04/17/23 1843 04/19/23 0838  NA 136 136  K 4.0 4.3  CL 103 102  CO2 24 26  GLUCOSE 101* 106*  BUN 27* 28*  CREATININE 1.53* 1.80*  CALCIUM 9.2 9.4   Liver Function Tests: No results for input(s): "AST", "ALT", "ALKPHOS", "BILITOT", "PROT", "ALBUMIN" in the last 168 hours. No results for input(s): "LIPASE", "AMYLASE" in the last 168 hours. No results for input(s): "AMMONIA" in the last 168 hours. CBC: Recent Labs  Lab 04/17/23 1843  WBC 5.5  HGB 12.1  HCT 36.5  MCV 101.7*  PLT 199   Cardiac Enzymes: No results for input(s): "CKTOTAL", "CKMB", "CKMBINDEX", "TROPONINI" in the last 168 hours. BNP: Invalid input(s): "POCBNP" CBG: No results for input(s): "GLUCAP" in the last 168 hours. D-Dimer No results for input(s): "DDIMER" in the last 72 hours. Hgb A1c Recent Labs    04/17/23 1843  HGBA1C 5.4   Lipid  Profile Recent Labs    04/18/23 0454  CHOL 103  HDL 42  LDLCALC 42  TRIG 95  CHOLHDL 2.5   Thyroid function studies No results for input(s): "TSH", "T4TOTAL", "T3FREE", "THYROIDAB" in the last 72 hours.  Invalid input(s): "FREET3" Anemia work up No results for input(s): "VITAMINB12", "FOLATE", "FERRITIN", "TIBC", "IRON", "RETICCTPCT" in the last 72 hours. Urinalysis    Component Value Date/Time   COLORURINE YELLOW (A) 04/17/2023 2133   APPEARANCEUR CLEAR (A) 04/17/2023  2133   APPEARANCEUR Clear 01/28/2016 0841   LABSPEC 1.013 04/17/2023 2133   PHURINE 6.0 04/17/2023 2133   GLUCOSEU NEGATIVE 04/17/2023 2133   HGBUR NEGATIVE 04/17/2023 2133   BILIRUBINUR NEGATIVE 04/17/2023 2133   BILIRUBINUR neg 07/29/2019 1541   BILIRUBINUR Negative 01/28/2016 0841   KETONESUR NEGATIVE 04/17/2023 2133   PROTEINUR NEGATIVE 04/17/2023 2133   UROBILINOGEN 0.2 07/29/2019 1541   NITRITE NEGATIVE 04/17/2023 2133   LEUKOCYTESUR NEGATIVE 04/17/2023 2133   Sepsis Labs Recent Labs  Lab 04/17/23 1843  WBC 5.5   Microbiology No results found for this or any previous visit (from the past 240 hour(s)). Imaging EEG adult  Result Date: 04/18/2023 Charlsie Quest, MD     04/18/2023  8:52 PM Patient Name: Christine Burnett MRN: [id number] Epilepsy Attending: Charlsie Quest Referring Physician/Provider: Gordy Councilman, MD Date: 04/18/2023 Duration: 26.06 mins Patient history: 82 yo F with rhythmic shaking of the left lower extremity with classic description of a jacksonian march. EEG to evaluate for seizure Level of alertness: Awake, asleep AEDs during EEG study: None Technical aspects: This EEG study was done with scalp electrodes positioned according to the 10-20 International system of electrode placement. Electrical activity was reviewed with band pass filter of 1-70Hz , sensitivity of 7 uV/mm, display speed of 42mm/sec with a 60Hz  notched filter applied as appropriate. EEG data were recorded  continuously and digitally stored.  Video monitoring was available and reviewed as appropriate. Description: The posterior dominant rhythm consists of 8 Hz activity of moderate voltage (25-35 uV) seen predominantly in posterior head regions, symmetric and reactive to eye opening and eye closing. Sleep was characterized by vertex waves, sleep spindles (12 to 14 Hz), maximal frontocentral region. EEG showed intermittent generalized 3 to 6 Hz theta-delta slowing. Hyperventilation and photic stimulation were not performed.   ABNORMALITY - Intermittent slow, generalized IMPRESSION: This study is suggestive of mild diffuse encephalopathy. No seizures or epileptiform discharges were seen throughout the recording. Charlsie Quest   CT ANGIO HEAD NECK W WO CM  Result Date: 04/18/2023 CLINICAL DATA:  Abnormality in the basilar artery on MRA, possible acute infarcts on MRI EXAM: CT ANGIOGRAPHY HEAD AND NECK WITH AND WITHOUT CONTRAST TECHNIQUE: Multidetector CT imaging of the head and neck was performed using the standard protocol during bolus administration of intravenous contrast. Multiplanar CT image reconstructions and MIPs were obtained to evaluate the vascular anatomy. Carotid stenosis measurements (when applicable) are obtained utilizing NASCET criteria, using the distal internal carotid diameter as the denominator. RADIATION DOSE REDUCTION: This exam was performed according to the departmental dose-optimization program which includes automated exposure control, adjustment of the mA and/or kV according to patient size and/or use of iterative reconstruction technique. CONTRAST:  75mL OMNIPAQUE IOHEXOL 350 MG/ML SOLN COMPARISON:  07/03/2014 CTA and, correlation is made with 04/18/2023 MRI head and MRA head and 04/17/2023 CT head FINDINGS: CT HEAD FINDINGS Brain: The infarcts noted on the recent MRI are not apparent on CT. No evidence of new acute infarct, hemorrhage, mass, mass effect, or midline shift. No  hydrocephalus or extra-axial fluid collection. Periventricular white matter changes, likely the sequela of chronic small vessel ischemic disease. Vascular: No hyperdense vessel. Atherosclerotic calcifications in the intracranial carotid and vertebral arteries. Skull: Negative for fracture or focal lesion. Sinuses/Orbits: Mucosal thickening in the ethmoid air cells. No acute finding in the orbits. Other: The mastoid air cells are well aerated. CTA NECK FINDINGS Aortic arch: Standard branching. Imaged portion shows no evidence of aneurysm or  dissection. Evaluation of the arch vessel origins is limited by beam hardening artifact from the adjacent contrast bolus, but there is at least 50% stenosis at each of the vessel origins. Right carotid system: No evidence of dissection, occlusion, or hemodynamically significant stenosis (greater than 50%). Atherosclerotic disease at the bifurcation and in the proximal and distal ICA is not hemodynamically significant. Comparatively poor opacification of the right ICA compared to the left may be related to the degree of stenosis at the origin of the brachiocephalic artery. Left carotid system: No evidence of dissection, occlusion, or hemodynamically significant stenosis (greater than 50%). Atherosclerotic disease at the bifurcation and in the proximal and distal ICA is not hemodynamically significant. Vertebral arteries: Poor visualization of the origin of the right vertebral artery secondary to the adjacent contrast bolus. Within that limitation, there is likely severe stenosis at the origin of the right vertebral artery, which is very poorly opacified until the V3 segment (series 10, image 27). Moderate stenosis at the origin of left vertebral artery, which is non dominant. The left vertebral artery is patent through the skull base. No evidence of dissection. Skeleton: No acute osseous abnormality. Degenerative changes in the cervical spine. Other neck: No acute finding. Upper  chest: Emphysema. No focal pulmonary opacity or pleural effusion. Review of the MIP images confirms the above findings CTA HEAD FINDINGS Anterior circulation: Both internal carotid arteries are patent to the termini, with calcifications but without significant stenosis, although the right ICA is poorly opacified. 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 A1 segments patent. Normal anterior communicating artery. Anterior cerebral arteries are patent to their distal aspects without significant stenosis. No M1 stenosis or occlusion. MCA branches perfused to their distal aspects without significant stenosis. Opacification of the right ACA and MCA is improved compared to the right ICA, likely reflecting collateral flow from the right posterior communicating artery. Posterior circulation: Moderate to severe stenosis in the proximal left V4 (series 10, images 155, 152, and 145). The left vertebral artery primarily supplies the left PICA, which is patent. Mild stenosis in the proximal right V4, which is otherwise patent to the vertebrobasilar junction. The right PICA is also patent. Given poor opacification of the extracranial V4, this is likely retrograde. Basilar patent to its distal aspect without significant stenosis. Superior cerebellar arteries patent proximally. Patent P1 segments. The bilateral posterior communicating arteries are patent. PCAs perfused to their distal aspects without significant stenosis. Venous sinuses: As permitted by contrast timing, patent. Anatomic variants: None significant. No evidence of aneurysm or vascular malformation. Review of the MIP images confirms the above findings IMPRESSION: 1. The infarcts noted on the recent MRI are not apparent on CT. No evidence of new acute infarct. 2. No intracranial large vessel occlusion. 3. There  is likely severe stenosis at the origin of the right vertebral artery, which is poorly opacified until the V4 segment, where there is likely retrograde opacification. 4. Poor opacification of the right ICA, which may be related to stenosis at the origin of the brachiocephalic artery. Improved opacification of the right ACA and MCA is likely collateral flow from the posterior communicating artery. 5. Moderate stenosis at the origin of the left vertebral artery, with additional moderate to severe stenosis in the proximal left V4. 6. 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 7. Evaluation of the arch vessel origins is limited by beam hardening artifact from the adjacent contrast bolus, but there is likely at least 50% stenosis at each of the vessel origins. \ Electronically Signed   By: Wiliam Ke M.D.   On: 04/18/2023 19:41   US Carotid Bilateral  Result Date: 04/18/2023 CLINICAL DATA:  TIA Hyperlipidemia Former tobacco user EXAM: BILATERAL CAROTID DUPLEX ULTRASOUND TECHNIQUE: Wallace Cullens scale imaging, color Doppler and duplex ultrasound were performed of bilateral carotid and vertebral arteries in the neck. COMPARISON:  07/29/2018 FINDINGS: Criteria: Quantification of carotid stenosis is based on velocity parameters that correlate the residual internal carotid diameter with NASCET-based stenosis levels, using the diameter of the distal internal carotid lumen as the denominator for stenosis measurement. The following velocity measurements were obtained: RIGHT ICA: 39/4 cm/sec CCA: 18/11 cm/sec SYSTOLIC ICA/CCA RATIO:  2.2 ECA: 50 cm/sec LEFT ICA: 242/36 cm/sec CCA: 77/15 cm/sec SYSTOLIC ICA/CCA RATIO:  3.1 ECA: 156 cm/sec RIGHT CAROTID ARTERY: Mild calcified plaque of the distal common carotid artery. Common carotid artery waveform is abnormal and monophasic. Internal carotid artery waveform is abnormal with  reversal of systolic flow. RIGHT VERTEBRAL ARTERY:  Bidirectional flow. LEFT CAROTID ARTERY: Bulky calcified plaque noted at the distal left common and proximal internal carotid arteries. Common carotid artery waveform is abnormal and monophasic. LEFT VERTEBRAL ARTERY:  Antegrade flow. Right upper extremity blood pressure is significantly lower than left with right side measuring 130/98 mm Hg and left measuring 164/82 mm Hg. IMPRESSION: 1. Monophasic waveform in the right common carotid artery and reversal of systolic flow in right internal carotid artery consistent with significant proximal stenosis. The site of significant origin of the right brachiocephalic artery asymmetrically decreased right upper extremity blood pressure as well as bidirectional flow in the right vertebral artery. 2. Monophasic waveform in the left common carotid artery consistent with significant stenosis at the origin of the common carotid artery. 3. 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 4. Further evaluation with CT angiography of the neck is recommended. Electronically Signed   By: Acquanetta Belling M.D.   On: 04/18/2023 15:32   MR ANGIO HEAD WO CONTRAST  Result Date: 04/18/2023 CLINICAL DATA:  Neuro deficit, acute, stroke suspected EXAM: MRA HEAD WITHOUT CONTRAST TECHNIQUE: Angiographic images of the Circle of Willis were acquired using MRA technique without intravenous contrast. 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 Normal flow void was visualized on same day brain MRI. There is reconstitution of the level of the ICA terminus via collaterals from the circle-of-Willis. There is normal arborization in the bilateral MCA territories. Posterior circulation: There is nearly absent flow void in the basilar  artery with robust PCOM bilaterally. P2 and P3 segments demonstrate normal flow signal. Anatomic variants: Fetal PCAs bilaterally. Other: None. IMPRESSION: 1. Asymmetrically decreased flow void in the right petrous, cavernous, and supraclinoid ICA is worrisome for interval development of a new right ICA occlusion. There is reconstitution of the level of the ICA terminus via collaterals from the circle-of-Willis. Recommend further evaluation with a CT head and neck angiogram 2. Nearly absent flow void in the basilar artery with robust PCOM bilaterally. 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 Recommend attention on CTA. These results will be  called to the ordering clinician or representative by the Radiologist Assistant, and communication documented in the PACS or [employer]. Electronically Signed   By: Lorenza Cambridge M.D.   On: 04/18/2023 14:48   ECHOCARDIOGRAM COMPLETE BUBBLE STUDY  Result Date: 04/18/2023    ECHOCARDIOGRAM REPORT   Patient Name:   Christine Burnett Date of Exam: 04/18/2023 Medical Rec #:  [id number]          Height:       66.0 in Accession #:    [id number]         Weight:       136.7 lb Date of Birth:  1941/06/01          BSA:          1.701 m Patient Age:    82 years           BP:           149/74 mmHg Patient Gender: F                  HR:           93 bpm. Exam Location:  [facility] Procedure: 2D Echo, Cardiac Doppler, Color Doppler and Saline Contrast Bubble            Study Indications:     TIA 435.9 / G45.9  History:         Patient has prior history of Echocardiogram examinations, most                  recent 01/26/2021. COPD, TIA and Stroke; Risk                  Factors:Dyslipidemia.  Sonographer:     Cristela Blue Referring Phys:  [id number] JAN A MANSY Diagnosing Phys: Julien Nordmann MD IMPRESSIONS  1. Left ventricular ejection fraction, by estimation, is 55 to 60%. The left ventricle has normal function. The left ventricle has no regional wall motion  abnormalities. Left ventricular diastolic parameters are consistent with Grade I diastolic dysfunction (impaired relaxation).  2. Right ventricular systolic function is normal. The right ventricular size is normal. There is normal pulmonary artery systolic pressure. The estimated right ventricular systolic pressure is 33.3 mmHg.  3. The mitral valve is normal in structure. Moderate mitral valve regurgitation. No evidence of mitral stenosis.  4. Tricuspid valve regurgitation is moderate.  5. The aortic valve is normal in structure. Aortic valve regurgitation is mild. Aortic valve sclerosis/calcification is present, without any evidence of aortic stenosis.  6. The inferior vena cava is normal in size with greater than 50% respiratory variability, suggesting right atrial pressure of 3 mmHg.  7. Agitated saline contrast bubble study was negative, with no evidence of any interatrial shunt. FINDINGS  Left Ventricle: Left ventricular ejection fraction, by estimation, is 55 to 60%. The left ventricle has normal function. The left ventricle has no regional wall motion abnormalities. The left ventricular internal cavity size was normal in size. There is  no left ventricular hypertrophy. Left ventricular diastolic parameters are consistent with Grade I diastolic dysfunction (impaired relaxation). Right Ventricle: The right ventricular size is normal. No increase in right ventricular wall thickness. Right ventricular systolic function is normal. There is normal pulmonary artery systolic pressure. The tricuspid regurgitant velocity is 2.66 m/s, and  with an assumed right atrial pressure of 5 mmHg, the estimated right ventricular systolic pressure is 33.3 mmHg. Left Atrium: Left atrial size was normal in size. Right Atrium: Right  atrial size was normal in size. Pericardium: There is no evidence of pericardial effusion. Mitral Valve: The mitral valve is normal in structure. There is mild calcification of the mitral valve  leaflet(s). Moderate mitral valve regurgitation. No evidence of mitral valve stenosis. MV peak gradient, 10.4 mmHg. The mean mitral valve gradient is 4.0 mmHg. Tricuspid Valve: The tricuspid valve is normal in structure. Tricuspid valve regurgitation is moderate . No evidence of tricuspid stenosis. Aortic Valve: The aortic valve is normal in structure. Aortic valve regurgitation is mild. Aortic valve sclerosis/calcification is present, without any evidence of aortic stenosis. Aortic valve mean gradient measures 4.2 mmHg. Aortic valve peak gradient measures 7.0 mmHg. Aortic valve area, by VTI measures 2.26 cm. Pulmonic Valve: The pulmonic valve was normal in structure. Pulmonic valve regurgitation is not visualized. No evidence of pulmonic stenosis. Aorta: The aortic root is normal in size and structure. Venous: The inferior vena cava is normal in size with greater than 50% respiratory variability, suggesting right atrial pressure of 3 mmHg. IAS/Shunts: No atrial level shunt detected by color flow Doppler. Agitated saline contrast was given intravenously to evaluate for intracardiac shunting. Agitated saline contrast bubble study was negative, with no evidence of any interatrial shunt. There  is no evidence of a patent foramen ovale. There is no evidence of an atrial septal defect.  LEFT VENTRICLE PLAX 2D LVIDd:         4.10 cm   Diastology LVIDs:         2.40 cm   LV e' medial:    5.11 cm/s LV PW:         1.00 cm   LV E/e' medial:  13.5 LV IVS:        1.00 cm   LV e' lateral:   5.98 cm/s LVOT diam:     2.00 cm   LV E/e' lateral: 11.6 LV SV:         59 LV SV Index:   35 LVOT Area:     3.14 cm  RIGHT VENTRICLE RV Basal diam:  2.20 cm RV Mid diam:    1.50 cm LEFT ATRIUM           Index        RIGHT ATRIUM          Index LA diam:      2.40 cm 1.41 cm/m   RA Area:     9.17 cm LA Vol (A2C): 18.4 ml 10.82 ml/m  RA Volume:   16.70 ml 9.82 ml/m LA Vol (A4C): 26.5 ml 15.58 ml/m  AORTIC VALVE AV Area (Vmax):    1.84 cm  AV Area (Vmean):   1.76 cm AV Area (VTI):     2.26 cm AV Vmax:           132.50 cm/s AV Vmean:          97.350 cm/s AV VTI:            0.260 m AV Peak Grad:      7.0 mmHg AV Mean Grad:      4.2 mmHg LVOT Vmax:         77.40 cm/s LVOT Vmean:        54.600 cm/s LVOT VTI:          0.187 m LVOT/AV VTI ratio: 0.72  AORTA Ao Root diam: 2.90 cm MITRAL VALVE                TRICUSPID VALVE MV Area (PHT): 8.62  cm     TR Peak grad:   28.3 mmHg MV Area VTI:   2.27 cm     TR Vmax:        266.00 cm/s MV Peak grad:  10.4 mmHg MV Mean grad:  4.0 mmHg     SHUNTS MV Vmax:       1.61 m/s     Systemic VTI:  0.19 m MV Vmean:      93.8 cm/s    Systemic Diam: 2.00 cm MV Decel Time: 88 msec MV E velocity: 69.20 cm/s MV A velocity: 123.00 cm/s MV E/A ratio:  0.56 Julien Nordmann MD Electronically signed by Julien Nordmann MD Signature Date/Time: 04/18/2023/11:16:32 AM    Final    MR BRAIN WO CONTRAST  Result Date: 04/18/2023 CLINICAL DATA:  Initial evaluation for acute TIA. EXAM: MRI HEAD WITHOUT CONTRAST TECHNIQUE: Multiplanar, multiecho pulse sequences of the brain and surrounding structures were obtained without intravenous contrast. COMPARISON:  Prior CT from 04/17/2023. FINDINGS: Brain: Diffuse prominence of the CSF containing spaces compatible generalized cerebral atrophy. Patchy and confluent T2/FLAIR hyperintensity involving the periventricular and deep white matter both cerebral hemispheres as well as the pons, most likely related to chronic microvascular ischemic disease, advanced in nature. Small remote bilateral cerebellar infarcts, left greater than right. There is question of a punctate focus of restricted diffusion positioned along the midline of the splenium, seen only on coronal DWI sequence (series 18, image 24). Additionally, there is a questionable punctate focus of diffusion signal abnormality within the right cerebellar hemisphere as well (series 9, image 62). While these findings could be artifactual in nature,  possible tiny acute ischemic infarcts are difficult to exclude, and could be considered in correct clinical setting. No associated hemorrhage. No other evidence for acute or subacute ischemia. Gray-white matter differentiation otherwise maintained. No acute intracranial hemorrhage. Small focus of chronic hemosiderin staining noted at the high anterior right frontal lobe (series 14, image 50), suggesting prior hemorrhage at this location. No mass lesion, midline shift or mass effect. No hydrocephalus or extra-axial fluid collection. Pituitary gland suprasellar region within normal limits. Vascular: Major intracranial vascular flow voids are maintained. Skull and upper cervical spine: Craniocervical junctional limits. Bone marrow signal intensity heterogeneous but overall within normal limits. No scalp soft tissue abnormality. Sinuses/Orbits: Globes and orbital soft tissues within normal limits. Scattered postop thickening present about the ethmoidal air cells. Paranasal sinuses are otherwise clear. No mastoid effusion. Other: None. IMPRESSION: 1. Question punctate foci of restricted diffusion involving the mid splenium and within the right cerebellar hemisphere as above. While these findings could be artifactual in nature, possible tiny acute ischemic infarcts are difficult to exclude, and could be considered in correct clinical setting. No associated hemorrhage. 2. 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 Electronically Signed   By: Rise Mu M.D.   On: 04/18/2023 02:09      Time coordinating discharge: over 30 minutes  SIGNED:  Sunnie Nielsen DO [facility]

## 2023-04-19 NOTE — Plan of Care (Signed)
  Problem: Education: Goal: Knowledge of disease or condition will improve Outcome: Progressing Goal: Knowledge of secondary prevention will improve (MUST DOCUMENT ALL) Outcome: Progressing Goal: Knowledge of patient specific risk factors will improve Loraine Leriche N/A or DELETE if not current risk factor) Outcome: Progressing   Problem: Ischemic Stroke/TIA Tissue Perfusion: Goal: Complications of ischemic stroke/TIA will be minimized Outcome: Progressing   Problem: Coping: Goal: Will verbalize positive feelings about self Outcome: Progressing Goal: Will identify appropriate support needs Outcome: Progressing   Problem: Health Behavior/Discharge Planning: Goal: Ability to manage health-related needs will improve Outcome: Progressing Goal: Goals will be collaboratively established with patient/family Outcome: Progressing   Problem: Self-Care: Goal: Ability to participate in self-care as condition permits will improve Outcome: Progressing Goal: Verbalization of feelings and concerns over difficulty with self-care will improve Outcome: Progressing Goal: Ability to communicate needs accurately will improve Outcome: Progressing   Problem: Nutrition: Goal: Risk of aspiration will decrease Outcome: Progressing Goal: Dietary intake will improve Outcome: Progressing   Problem: Education: Goal: Knowledge of General Education information will improve Description: Including pain rating scale, medication(s)/side effects and non-pharmacologic comfort measures Outcome: Progressing   Problem: Health Behavior/Discharge Planning: Goal: Ability to manage health-related needs will improve Outcome: Progressing   Problem: Clinical Measurements: Goal: Ability to maintain clinical measurements within normal limits will improve Outcome: Progressing Goal: Will remain free from infection Outcome: Progressing Goal: Diagnostic test results will improve Outcome: Progressing Goal: Respiratory  complications will improve Outcome: Progressing Goal: Cardiovascular complication will be avoided Outcome: Progressing   Problem: Activity: Goal: Risk for activity intolerance will decrease Outcome: Progressing   Problem: Nutrition: Goal: Adequate nutrition will be maintained Outcome: Progressing   Problem: Coping: Goal: Level of anxiety will decrease Outcome: Progressing   Problem: Elimination: Goal: Will not experience complications related to bowel motility Outcome: Progressing Goal: Will not experience complications related to urinary retention Outcome: Progressing   Problem: Pain Management: Goal: General experience of comfort will improve Outcome: Progressing   Problem: Safety: Goal: Ability to remain free from injury will improve Outcome: Progressing   Problem: Skin Integrity: Goal: Risk for impaired skin integrity will decrease Outcome: Progressing   Problem: Education: Goal: Knowledge of patient specific risk factors will improve Loraine Leriche N/A or DELETE if not current risk factor) Outcome: Progressing   Problem: Ischemic Stroke/TIA Tissue Perfusion: Goal: Complications of ischemic stroke/TIA will be minimized Outcome: Progressing   Problem: Coping: Goal: Will verbalize positive feelings about self Outcome: Progressing

## 2023-04-19 NOTE — H&P (View-Only) (Signed)
 [facility] Vascular Consult Note  MRN : [id number]  Christine Burnett is a 82 y.o. (1940/10/22) female who presents with chief complaint of  Chief Complaint  Patient presents with   Weakness  .  History of Present Illness: I am asked to see the patient by Dr. Lyn Hollingshead for evaluation of recent TIA symptoms with significant cerebrovascular disease.  The patient has had 2 episodes of confusion associated with left sided weakness.  Her symptoms were transient and she resolved to her baseline shortly after the initiation of symptoms.  She has been walking in the halls without any limitation.  She has no current confusion.  She is accompanied by her family today who are comfortable that she is essentially at her baseline.  She has undergone a litany of imaging studies many of which I have reviewed.  She has an MRI which shows a potential tiny punctate area of ischemic infarct and otherwise just chronic atrophy.  Her CT angiogram was notable for what appeared to be a high-grade innominate artery stenosis.  The opacification of the more distal right internal carotid artery was fairly poor and this correlated with her duplex study which showed monophasic flow in her right common carotid artery consistent with proximal stenosis.  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  Current Facility-Administered Medications  Medication Dose Route Frequency Provider Last Rate Last Admin   acetaminophen (TYLENOL) tablet 650 mg  650 mg Oral Q4H PRN Mansy, Vernetta Honey, MD       Or   acetaminophen (TYLENOL) 160 MG/5ML solution 650 mg  650 mg Per Tube Q4H PRN Mansy, Vernetta Honey, MD       Or   acetaminophen (TYLENOL) suppository 650 mg  650 mg Rectal Q4H PRN Mansy, Jan A, MD       albuterol (PROVENTIL) (2.5 MG/3ML) 0.083% nebulizer  solution 2.5 mg  2.5 mg Nebulization Q4H PRN Otelia Sergeant, RPH       aspirin EC tablet 81 mg  81 mg Oral QHS Mansy, Jan A, MD   81 mg at 04/18/23 2030   atorvastatin (LIPITOR) tablet 40 mg  40 mg Oral QHS Mansy, Jan A, MD   40 mg at 04/18/23 2029   calcium carbonate (TUMS - dosed in mg elemental calcium) chewable tablet 300 mg of elemental calcium  300 mg of elemental calcium Oral Q MTWThF Mansy, Jan A, MD       cholecalciferol (VITAMIN D3) tablet 2,000 Units  2,000 Units Oral QHS Mansy, Jan A, MD   2,000 Units at 04/18/23 2029   enoxaparin (LOVENOX) injection 30 mg  30 mg Subcutaneous Q24H Mansy, Jan A, MD   30 mg at 04/19/23 0848   lamoTRIgine (LAMICTAL) tablet 25 mg  25 mg Oral QHS Bhagat, Srishti L, MD   25 mg at 04/18/23 2029   melatonin tablet 2.5 mg  2.5 mg Oral QHS Mansy, Jan A, MD   2.5 mg at 04/18/23 2029   multivitamin with minerals tablet 1 tablet  1 tablet Oral Daily Mansy, Jan A, MD   1 tablet at 04/19/23 0849   ondansetron [facility]) injection 4 mg  4 mg Intravenous Q4H PRN Mansy, Jan A, MD       PARoxetine (PAXIL) tablet 40 mg  40 mg Oral q morning Mansy, Jan  A, MD   40 mg at 04/19/23 0849   senna-docusate (Senokot-S) tablet 1 tablet  1 tablet Oral QHS PRN Mansy, Vernetta Honey, MD       ticagrelor (BRILINTA) tablet 180 mg  180 mg Oral Once Bhagat, Srishti L, MD       Followed by   ticagrelor (BRILINTA) tablet 90 mg  90 mg Oral BID Bhagat, Srishti L, MD       traZODone (DESYREL) tablet 25 mg  25 mg Oral QHS PRN Mansy, Vernetta Honey, MD        Past Medical History:  Diagnosis Date   Allergic rhinitis    Anxiety    Arthritis Me   Chronic bronchitis (HCC)    Chronic kidney disease, stage III (moderate) (HCC) 08/31/2017   Seeing nephrologist   CKD (chronic kidney disease), stage III (HCC)    COPD (chronic obstructive pulmonary disease) (HCC)    Cornea scar    Depression    unspecified   Diverticulosis    Emphysema of lung (HCC) Me   Hx of fracture of left hip 12/24/2017   Hyperlipidemia     Hyperparathyroidism, 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   Managed by nephrologist   Non-small cell lung cancer (NSCLC) (HCC)    Osteoporosis    Oxygen deficiency Me   Risk for falls    Situational disturbance    Stroke (HCC) 2011   tia's x 2   TIA (transient ischemic attack)     Past Surgical History:  Procedure Laterality Date   COLONOSCOPY  2016   ESOPHAGUS SURGERY     INTRAMEDULLARY (IM) NAIL INTERTROCHANTERIC Left 03/26/2017   Procedure: INTRAMEDULLARY (IM) NAIL INTERTROCHANTRIC;  Surgeon: Kennedy Bucker, MD;  Location: [facility] ORS;  Service: Orthopedics;  Laterality: Left;   LEFT HEART CATH AND CORONARY ANGIOGRAPHY N/A 04/15/2021   Procedure: LEFT HEART CATH AND CORONARY ANGIOGRAPHY;  Surgeon: Iran Ouch, MD;  Location: [facility] INVASIVE CV LAB;  Service: Cardiovascular;  Laterality: N/A;   RIGHT/LEFT HEART CATH AND CORONARY ANGIOGRAPHY Bilateral 05/15/2022   Procedure: RIGHT/LEFT HEART CATH AND CORONARY ANGIOGRAPHY;  Surgeon: Iran Ouch, MD;  Location: [facility] INVASIVE CV LAB;  Service: Cardiovascular;  Laterality: Bilateral;   VIDEO BRONCHOSCOPY WITH ENDOBRONCHIAL NAVIGATION Right 06/28/2020   Procedure: VIDEO BRONCHOSCOPY WITH ENDOBRONCHIAL NAVIGATION CELLVIZIO;  Surgeon: Salena Saner, MD;  Location: [facility] ORS;  Service: Pulmonary;  Laterality: Right;     Social History   Tobacco Use   Smoking status: Former    Current packs/day: 0.00    Average packs/day: 1 pack/day for 61.0 years (61.0 ttl pk-yrs)    Types: Cigarettes    Start date: 03/25/1956    Quit date: 03/25/2017    Years since quitting: 6.0   Smokeless tobacco: Never  Vaping Use   Vaping status: Never Used  Substance Use Topics   Alcohol use: No   Drug use: No    Family History  Problem Relation Age of Onset   Other Mother        Ureme[place] posioning   Kidney disease Mother    Heart attack Father    Prostate cancer Father    Cancer Father    Heart disease Father    Heart attack Brother     Diabetes Brother    Hypertension Daughter    Kidney cancer Neg Hx    Bladder Cancer Neg Hx    Colon cancer Neg Hx    Colon polyps Neg Hx    Rectal cancer Neg Hx  Allergies  Allergen Reactions   Zithromax [Azithromycin]    Cefuroxime Axetil Rash   Ciprofloxacin Nausea Only    Unknown   Codeine Other (See Comments)    Patient does not like how the medication makes you feel      REVIEW OF SYSTEMS (Negative unless checked)  Constitutional: [] Weight loss  [] Fever  [] Chills Cardiac: [] Chest pain   [] Chest pressure   [] Palpitations   [] Shortness of breath when laying flat   [] Shortness of breath at rest   [] Shortness of breath with exertion. Vascular:  [] Pain in legs with walking   [] Pain in legs at rest   [] Pain in legs when laying flat   [] Claudication   [] Pain in feet when walking  [] Pain in feet at rest  [] Pain in feet when laying flat   [] History of DVT   [] Phlebitis   [] Swelling in legs   [] Varicose veins   [] Non-healing ulcers Pulmonary:   [] Uses home oxygen   [] Productive cough   [] Hemoptysis   [] Wheeze  [] COPD   [] Asthma Neurologic:  [] Dizziness  [] Blackouts   [] Seizures   [] History of stroke   [x] History of TIA  [] Aphasia   [] Temporary blindness   [] Dysphagia   [] Weakness or numbness in arms   [] Weakness or numbness in legs Musculoskeletal:  [x] Arthritis   [] Joint swelling   [x] Joint pain   [] Low back pain Hematologic:  [] Easy bruising  [] Easy bleeding   [] Hypercoagulable state   [] Anemic  [] Hepatitis Gastrointestinal:  [] Blood in stool   [] Vomiting blood  [] Gastroesophageal reflux/heartburn   [] Difficulty swallowing. Genitourinary:  [x] Chronic kidney disease   [] Difficult urination  [] Frequent urination  [] Burning with urination   [] Blood in urine Skin:  [] Rashes   [] Ulcers   [] Wounds Psychological:  [x] History of anxiety   []  History of major depression.  Physical Examination  Vitals:   04/18/23 1947 04/19/23 0019 04/19/23 0351 04/19/23 0804  BP: (!) 177/86 (!) 160/96  (!) 145/82 (!) 162/100  Pulse: 96 92 99 91  Resp: 17 18 20 18   Temp: 98.2 F (36.8 C) 98.2 F (36.8 C) 97.9 F (36.6 C) 98 F (36.7 C)  TempSrc:      SpO2: 97% 94% 100% 99%  Weight:      Height:       Body mass index is 22.06 kg/m. Gen:  WD/WN, NAD Head: [place]/AT, No temporalis wasting.  Ear/Nose/Throat: Hearing grossly intact, nares w/o erythema or drainage, oropharynx w/o Erythema/Exudate Eyes: Sclera non-icteric, conjunctiva clear Neck: Trachea midline.  No JVD.  Pulmonary:  Good air movement, respirations not labored, equal bilaterally.  Cardiac: RRR, normal S1, S2. Vascular:  Vessel Right Left  Radial Palpable Palpable   Musculoskeletal: M/S 5/5 throughout.  Extremities without ischemic changes.  No deformity or atrophy. No edema. Neurologic: Sensation grossly intact in extremities.  Symmetrical.  Speech is fluent. Motor exam as listed above. Psychiatric: Judgment intact, Mood & affect appropriate for pt's clinical situation. Dermatologic: No rashes or ulcers noted.  No cellulitis or open wounds.      CBC Lab Results  Component Value Date   WBC 5.5 04/17/2023   HGB 12.1 04/17/2023   HCT 36.5 04/17/2023   MCV 101.7 (H) 04/17/2023   PLT 199 04/17/2023    BMET    Component Value Date/Time   NA 136 04/19/2023 0838   NA 141 04/13/2021 1122   NA 141 07/03/2014 0526   K 4.3 04/19/2023 0838   K 3.9 07/03/2014 0526   CL 102 04/19/2023 2130  CL 109 (H) 07/03/2014 0526   CO2 26 04/19/2023 0838   CO2 25 07/03/2014 0526   GLUCOSE 106 (H) 04/19/2023 0838   GLUCOSE 90 07/03/2014 0526   BUN 28 (H) 04/19/2023 0838   BUN 25 04/13/2021 1122   BUN 15 07/03/2014 0526   CREATININE 1.80 (H) 04/19/2023 0838   CREATININE 1.54 (H) 05/03/2020 1359   CALCIUM 9.4 04/19/2023 0838   CALCIUM 9.3 07/03/2014 0526   GFRNONAA 28 (L) 04/19/2023 0838   GFRNONAA 32 (L) 05/03/2020 1359   GFRAA 37 (L) 05/03/2020 1359   Estimated Creatinine Clearance: 22.2 mL/min (A) (by C-G formula  based on SCr of 1.8 mg/dL (H)).  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 EEG to evaluate for seizure Level of alertness: Awake, asleep AEDs during EEG study: None Technical aspects: This EEG study was done with scalp electrodes positioned according to the 10-20 International system of electrode placement. Electrical activity was reviewed with band pass filter of 1-70Hz , sensitivity of 7 uV/mm, display speed of 71mm/sec with a 60Hz  notched filter applied as appropriate. EEG data were recorded continuously and digitally stored.  Video monitoring was available and reviewed as appropriate. Description: The posterior dominant rhythm consists of 8 Hz activity of moderate voltage (25-35 uV) seen predominantly in posterior head regions, symmetric and reactive to eye opening and eye closing. Sleep was characterized by vertex waves, sleep spindles (12 to 14 Hz), maximal frontocentral region. EEG showed intermittent generalized 3 to 6 Hz theta-delta slowing. Hyperventilation and photic stimulation were not performed.   ABNORMALITY - Intermittent slow, generalized IMPRESSION: This study is suggestive of mild diffuse encephalopathy. No seizures or epileptiform discharges were seen throughout the recording. Charlsie Quest   CT ANGIO HEAD NECK W WO CM  Result Date: 04/18/2023 CLINICAL DATA:  Abnormality in the basilar artery on MRA, possible acute infarcts on MRI EXAM: CT ANGIOGRAPHY HEAD AND NECK WITH AND WITHOUT CONTRAST TECHNIQUE: Multidetector CT imaging of  the head and neck was performed using the standard protocol during bolus administration of intravenous contrast. Multiplanar CT image reconstructions and MIPs were obtained to evaluate the vascular anatomy. Carotid stenosis measurements (when applicable) are obtained utilizing NASCET criteria, using the distal internal carotid diameter as the denominator. RADIATION DOSE REDUCTION: This exam was performed according to the departmental dose-optimization program which includes automated exposure control, adjustment of the mA and/or kV according to patient size and/or use of iterative reconstruction technique. CONTRAST:  75mL OMNIPAQUE IOHEXOL 350 MG/ML SOLN COMPARISON:  07/03/2014 CTA and, correlation is made with 04/18/2023 MRI head and MRA head and 04/17/2023 CT head FINDINGS: CT HEAD FINDINGS Brain: The infarcts noted on the recent MRI are not apparent on CT. No evidence of new acute infarct, hemorrhage, mass, mass effect, or midline shift. No hydrocephalus or extra-axial fluid collection. Periventricular white matter changes, likely the sequela of chronic small vessel ischemic disease. Vascular: No hyperdense vessel. Atherosclerotic calcifications in the intracranial carotid and vertebral arteries. Skull: Negative for fracture or focal lesion. Sinuses/Orbits: Mucosal thickening in the  ethmoid air cells. No acute finding in the orbits. Other: The mastoid air cells are well aerated. CTA NECK FINDINGS Aortic arch: Standard branching. Imaged portion shows no evidence of aneurysm or dissection. Evaluation of the arch vessel origins is limited by beam hardening artifact from the adjacent contrast bolus, but there is at least 50% stenosis at each of the vessel origins. Right carotid system: No evidence of dissection, occlusion, or hemodynamically significant stenosis (greater than 50%). Atherosclerotic disease at the bifurcation and in the proximal and distal ICA is not hemodynamically significant. Comparatively poor  opacification of the right ICA compared to the left may be related to the degree of stenosis at the origin of the brachiocephalic artery. Left carotid system: No evidence of dissection, occlusion, or hemodynamically significant stenosis (greater than 50%). Atherosclerotic disease at the bifurcation and in the proximal and distal ICA is not hemodynamically significant. Vertebral arteries: Poor visualization of the origin of the right vertebral artery secondary to the adjacent contrast bolus. Within that limitation, there is likely severe stenosis at the origin of the right vertebral artery, which is very poorly opacified until the V3 segment (series 10, image 27). Moderate stenosis at the origin of left vertebral artery, which is non dominant. The left vertebral artery is patent through the skull base. No evidence of dissection. Skeleton: No acute osseous abnormality. Degenerative changes in the cervical spine. Other neck: No acute finding. Upper chest: Emphysema. No focal pulmonary opacity or pleural effusion. Review of the MIP images confirms the above findings CTA HEAD FINDINGS Anterior circulation: Both internal carotid arteries are patent to the termini, with calcifications but without significant stenosis, although the right ICA is poorly opacified. 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 A1 segments patent. Normal anterior communicating artery. Anterior cerebral arteries are patent to their distal aspects without significant stenosis. No M1 stenosis or occlusion. MCA branches perfused to their distal aspects without significant stenosis. Opacification of the right ACA and MCA is improved compared to the right ICA, likely reflecting collateral flow from the right posterior communicating artery. Posterior circulation: Moderate to severe stenosis in the  proximal left V4 (series 10, images 155, 152, and 145). The left vertebral artery primarily supplies the left PICA, which is patent. Mild stenosis in the proximal right V4, which is otherwise patent to the vertebrobasilar junction. The right PICA is also patent. Given poor opacification of the extracranial V4, this is likely retrograde. Basilar patent to its distal aspect without significant stenosis. Superior cerebellar arteries patent proximally. Patent P1 segments. The bilateral posterior communicating arteries are patent. PCAs perfused to their distal aspects without significant stenosis. Venous sinuses: As permitted by contrast timing, patent. Anatomic variants: None significant. No evidence of aneurysm or vascular malformation. Review of the MIP images confirms the above findings IMPRESSION: 1. The infarcts noted on the recent MRI are not apparent on CT. No evidence of new acute infarct. 2. No intracranial large vessel occlusion. 3. 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 4. Poor opacification of the right ICA, which may be related to stenosis at the origin of the brachiocephalic artery. Improved opacification of the right ACA and MCA is likely collateral flow from the posterior communicating artery. 5. Moderate stenosis at the  origin of the left vertebral artery, with additional moderate to severe stenosis in the proximal left V4. 6. 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 7. Evaluation of the arch vessel origins is limited by beam hardening artifact from the adjacent contrast bolus, but there is likely at least 50% stenosis at each of the vessel origins. \ Electronically Signed   By: Wiliam Ke M.D.   On: 04/18/2023 19:41   US Carotid Bilateral  Result Date:  04/18/2023 CLINICAL DATA:  TIA Hyperlipidemia Former tobacco user EXAM: BILATERAL CAROTID DUPLEX ULTRASOUND TECHNIQUE: Wallace Cullens scale imaging, color Doppler and duplex ultrasound were performed of bilateral carotid and vertebral arteries in the neck. COMPARISON:  07/29/2018 FINDINGS: Criteria: Quantification of carotid stenosis is based on velocity parameters that correlate the residual internal carotid diameter with NASCET-based stenosis levels, using the diameter of the distal internal carotid lumen as the denominator for stenosis measurement. The following velocity measurements were obtained: RIGHT ICA: 39/4 cm/sec CCA: 18/11 cm/sec SYSTOLIC ICA/CCA RATIO:  2.2 ECA: 50 cm/sec LEFT ICA: 242/36 cm/sec CCA: 77/15 cm/sec SYSTOLIC ICA/CCA RATIO:  3.1 ECA: 156 cm/sec RIGHT CAROTID ARTERY: Mild calcified plaque of the distal common carotid artery. Common carotid artery waveform is abnormal and monophasic. Internal carotid artery waveform is abnormal with reversal of systolic flow. RIGHT VERTEBRAL ARTERY:  Bidirectional flow. LEFT CAROTID ARTERY: Bulky calcified plaque noted at the distal left common and proximal internal carotid arteries. Common carotid artery waveform is abnormal and monophasic. LEFT VERTEBRAL ARTERY:  Antegrade flow. Right upper extremity blood pressure is significantly lower than left with right side measuring 130/98 mm Hg and left measuring 164/82 mm Hg. IMPRESSION: 1. Monophasic waveform in the right common carotid artery and reversal of systolic flow in right internal carotid artery consistent with significant proximal stenosis. The site of significant origin of the right brachiocephalic artery asymmetrically decreased right upper extremity blood pressure as well as bidirectional flow in the right vertebral artery. 2. Monophasic waveform in the left common carotid artery consistent with significant stenosis at the origin of the common carotid artery. 3. 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 4. Further evaluation with CT angiography of the neck is recommended. Electronically Signed   By: Acquanetta Belling M.D.   On: 04/18/2023 15:32   MR ANGIO HEAD WO CONTRAST  Result Date: 04/18/2023 CLINICAL DATA:  Neuro deficit, acute, stroke suspected EXAM: MRA HEAD WITHOUT CONTRAST TECHNIQUE: Angiographic images of the Circle of Willis were acquired using MRA technique without intravenous contrast. 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 Normal flow void was visualized on same day brain MRI. There is reconstitution of the level of the ICA terminus via collaterals from the circle-of-Willis. There is normal arborization in the bilateral MCA territories. Posterior circulation: There is nearly absent flow void in the basilar artery with robust PCOM bilaterally. P2 and P3 segments demonstrate normal flow signal. Anatomic variants: Fetal PCAs bilaterally. Other: None. IMPRESSION: 1. Asymmetrically decreased flow void in the right petrous, cavernous, and supraclinoid ICA is worrisome for interval development of a new right ICA occlusion. There is reconstitution of the level of the ICA terminus via collaterals from the circle-of-Willis. Recommend further evaluation with a CT head and neck  angiogram 2. Nearly absent flow void in the basilar artery with robust PCOM bilaterally. 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 Recommend attention on CTA. These results will be called to the ordering clinician or representative by the Radiologist Assistant, and communication documented in the PACS or [employer]. Electronically Signed   By: Lorenza Cambridge M.D.   On: 04/18/2023 14:48   ECHOCARDIOGRAM COMPLETE BUBBLE STUDY  Result Date: 04/18/2023    ECHOCARDIOGRAM REPORT    Patient Name:   GRABRIELA SCHELLER Date of Exam: 04/18/2023 Medical Rec #:  [id number]          Height:       66.0 in Accession #:    [id number]         Weight:       136.7 lb Date of Birth:  June 10, 1940          BSA:          1.701 m Patient Age:    82 years           BP:           149/74 mmHg Patient Gender: F                  HR:           93 bpm. Exam Location:  [facility] Procedure: 2D Echo, Cardiac Doppler, Color Doppler and Saline Contrast Bubble            Study Indications:     TIA 435.9 / G45.9  History:         Patient has prior history of Echocardiogram examinations, most                  recent 01/26/2021. COPD, TIA and Stroke; Risk                  Factors:Dyslipidemia.  Sonographer:     Cristela Blue Referring Phys:  [id number] JAN A MANSY Diagnosing Phys: Julien Nordmann MD IMPRESSIONS  1. Left ventricular ejection fraction, by estimation, is 55 to 60%. The left ventricle has normal function. The left ventricle has no regional wall motion abnormalities. Left ventricular diastolic parameters are consistent with Grade I diastolic dysfunction (impaired relaxation).  2. Right ventricular systolic function is normal. The right ventricular size is normal. There is normal pulmonary artery systolic pressure. The estimated right ventricular systolic pressure is 33.3 mmHg.  3. The mitral valve is normal in structure. Moderate mitral valve regurgitation. No evidence of mitral stenosis.  4. Tricuspid valve regurgitation is moderate.  5. The aortic valve is normal in structure. Aortic valve regurgitation is mild. Aortic valve sclerosis/calcification is present, without any evidence of aortic stenosis.  6. The inferior vena cava is normal in size with greater than 50% respiratory variability, suggesting right atrial pressure of 3 mmHg.  7. Agitated saline contrast bubble study was negative, with no evidence of any interatrial shunt. FINDINGS  Left Ventricle: Left ventricular ejection fraction, by estimation, is 55 to 60%. The  left ventricle has normal function. The left ventricle has no regional wall motion abnormalities. The left ventricular internal cavity size was normal in size. There is  no left ventricular hypertrophy. Left ventricular diastolic parameters are consistent with Grade I diastolic dysfunction (impaired relaxation). Right Ventricle: The right ventricular size is normal. No increase in right ventricular wall thickness. Right ventricular systolic function is normal. There is normal pulmonary artery systolic pressure. The tricuspid regurgitant velocity is 2.66  m/s, and  with an assumed right atrial pressure of 5 mmHg, the estimated right ventricular systolic pressure is 33.3 mmHg. Left Atrium: Left atrial size was normal in size. Right Atrium: Right atrial size was normal in size. Pericardium: There is no evidence of pericardial effusion. Mitral Valve: The mitral valve is normal in structure. There is mild calcification of the mitral valve leaflet(s). Moderate mitral valve regurgitation. No evidence of mitral valve stenosis. MV peak gradient, 10.4 mmHg. The mean mitral valve gradient is 4.0 mmHg. Tricuspid Valve: The tricuspid valve is normal in structure. Tricuspid valve regurgitation is moderate . No evidence of tricuspid stenosis. Aortic Valve: The aortic valve is normal in structure. Aortic valve regurgitation is mild. Aortic valve sclerosis/calcification is present, without any evidence of aortic stenosis. Aortic valve mean gradient measures 4.2 mmHg. Aortic valve peak gradient measures 7.0 mmHg. Aortic valve area, by VTI measures 2.26 cm. Pulmonic Valve: The pulmonic valve was normal in structure. Pulmonic valve regurgitation is not visualized. No evidence of pulmonic stenosis. Aorta: The aortic root is normal in size and structure. Venous: The inferior vena cava is normal in size with greater than 50% respiratory variability, suggesting right atrial pressure of 3 mmHg. IAS/Shunts: No atrial level shunt detected by  color flow Doppler. Agitated saline contrast was given intravenously to evaluate for intracardiac shunting. Agitated saline contrast bubble study was negative, with no evidence of any interatrial shunt. There  is no evidence of a patent foramen ovale. There is no evidence of an atrial septal defect.  LEFT VENTRICLE PLAX 2D LVIDd:         4.10 cm   Diastology LVIDs:         2.40 cm   LV e' medial:    5.11 cm/s LV PW:         1.00 cm   LV E/e' medial:  13.5 LV IVS:        1.00 cm   LV e' lateral:   5.98 cm/s LVOT diam:     2.00 cm   LV E/e' lateral: 11.6 LV SV:         59 LV SV Index:   35 LVOT Area:     3.14 cm  RIGHT VENTRICLE RV Basal diam:  2.20 cm RV Mid diam:    1.50 cm LEFT ATRIUM           Index        RIGHT ATRIUM          Index LA diam:      2.40 cm 1.41 cm/m   RA Area:     9.17 cm LA Vol (A2C): 18.4 ml 10.82 ml/m  RA Volume:   16.70 ml 9.82 ml/m LA Vol (A4C): 26.5 ml 15.58 ml/m  AORTIC VALVE AV Area (Vmax):    1.84 cm AV Area (Vmean):   1.76 cm AV Area (VTI):     2.26 cm AV Vmax:           132.50 cm/s AV Vmean:          97.350 cm/s AV VTI:            0.260 m AV Peak Grad:      7.0 mmHg AV Mean Grad:      4.2 mmHg LVOT Vmax:         77.40 cm/s LVOT Vmean:        54.600 cm/s LVOT VTI:          0.187 m LVOT/AV VTI  ratio: 0.72  AORTA Ao Root diam: 2.90 cm MITRAL VALVE                TRICUSPID VALVE MV Area (PHT): 8.62 cm     TR Peak grad:   28.3 mmHg MV Area VTI:   2.27 cm     TR Vmax:        266.00 cm/s MV Peak grad:  10.4 mmHg MV Mean grad:  4.0 mmHg     SHUNTS MV Vmax:       1.61 m/s     Systemic VTI:  0.19 m MV Vmean:      93.8 cm/s    Systemic Diam: 2.00 cm MV Decel Time: 88 msec MV E velocity: 69.20 cm/s MV A velocity: 123.00 cm/s MV E/A ratio:  0.56 Julien Nordmann MD Electronically signed by Julien Nordmann MD Signature Date/Time: 04/18/2023/11:16:32 AM    Final    MR BRAIN WO CONTRAST  Result Date: 04/18/2023 CLINICAL DATA:  Initial evaluation for acute TIA. EXAM: MRI HEAD WITHOUT  CONTRAST TECHNIQUE: Multiplanar, multiecho pulse sequences of the brain and surrounding structures were obtained without intravenous contrast. COMPARISON:  Prior CT from 04/17/2023. FINDINGS: Brain: Diffuse prominence of the CSF containing spaces compatible generalized cerebral atrophy. Patchy and confluent T2/FLAIR hyperintensity involving the periventricular and deep white matter both cerebral hemispheres as well as the pons, most likely related to chronic microvascular ischemic disease, advanced in nature. Small remote bilateral cerebellar infarcts, left greater than right. There is question of a punctate focus of restricted diffusion positioned along the midline of the splenium, seen only on coronal DWI sequence (series 18, image 24). Additionally, there is a questionable punctate focus of diffusion signal abnormality within the right cerebellar hemisphere as well (series 9, image 62). While these findings could be artifactual in nature, possible tiny acute ischemic infarcts are difficult to exclude, and could be considered in correct clinical setting. No associated hemorrhage. No other evidence for acute or subacute ischemia. Gray-white matter differentiation otherwise maintained. No acute intracranial hemorrhage. Small focus of chronic hemosiderin staining noted at the high anterior right frontal lobe (series 14, image 50), suggesting prior hemorrhage at this location. No mass lesion, midline shift or mass effect. No hydrocephalus or extra-axial fluid collection. Pituitary gland suprasellar region within normal limits. Vascular: Major intracranial vascular flow voids are maintained. Skull and upper cervical spine: Craniocervical junctional limits. Bone marrow signal intensity heterogeneous but overall within normal limits. No scalp soft tissue abnormality. Sinuses/Orbits: Globes and orbital soft tissues within normal limits. Scattered postop thickening present about the ethmoidal air cells. Paranasal sinuses  are otherwise clear. No mastoid effusion. Other: None. IMPRESSION: 1. Question punctate foci of restricted diffusion involving the mid splenium and within the right cerebellar hemisphere as above. While these findings could be artifactual in nature, possible tiny acute ischemic infarcts are difficult to exclude, and could be considered in correct clinical setting. No associated hemorrhage. 2. 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 Electronically Signed   By: Rise Mu M.D.   On: 04/18/2023 02:09   CT HEAD WO CONTRAST  Result Date: 04/17/2023 CLINICAL DATA:  A shin for acute blurry vision, speech difficulty, left lower extremity weakness. EXAM: CT HEAD WITHOUT CONTRAST TECHNIQUE: Contiguous axial images were obtained from the base of the skull through the vertex without intravenous contrast. RADIATION DOSE REDUCTION: This exam was performed according to the departmental dose-optimization program which includes  automated exposure control, adjustment of the mA and/or kV according to patient size and/or use of iterative reconstruction technique. COMPARISON:  Prior study from 07/13/2020. FINDINGS: Brain: Moderately advanced cerebral atrophy. 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 Remote left cerebellar infarct noted. No acute intracranial hemorrhage. No acute large vessel territory infarct. No mass lesion or midline shift. No hydrocephalus or extra-axial fluid collection. Vascular: No abnormal hyperdense vessel. Calcified atherosclerosis present at the skull base. Skull: Scalp soft tissues demonstrate no acute finding. Calvarium intact. Sinuses/Orbits: Globes orbital soft tissues within normal limits. Mild mucosal thickening present about the left ethmoidal air cells.  Visualized paranasal sinuses are otherwise clear. No mastoid effusion. Other: None. IMPRESSION: 1. No acute intracranial abnormality. 2. Moderately advanced cerebral atrophy with advanced chronic microvascular ischemic disease. 3. Remote left cerebellar infarct. Electronically Signed   By: Rise Mu M.D.   On: 04/17/2023 20:01      Assessment/Plan 1.  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  This would appear to represent a critical and life-threatening situation with a high risk of major stroke and poor neurologic outcome.  She was appropriately started on Plavix.  I had a long discussion with the patient and her family today regarding the situation.  Her more distal carotid artery is a little difficult to evaluate on the CT scan, but I would believe that her innominate lesion is likely the culprit lesion.  She does have some intracranial disease but no intervention would be required for this.  I believe she should have an angiogram of her great vessels tomorrow.  If high-grade innominate artery stenosis is indeed identified and is amenable to endovascular treatment, we can perform this concomitantly tomorrow.  I have discussed the procedure including the risks and benefits of the procedure in detail with the patient and her family.  I have discussed that with a high-grade lesion such as this that is symptomatic, if left untreated and treated medically the risk of stroke or recurrent neurologic events is very high.  They voiced their understanding and desire to proceed.  Please continue aspirin and Plavix as well as a statin agent both preoperatively and following the procedure. 2.  Essential hypertension.  Stable on outpatient medications and permissive parameters in the current setting due to her neurologic situation. 3.  Chronic kidney disease stage IIIb.  Hydrate and we will try to limit contrast is much as possible tomorrow. 4.  Dyslipidemia.   Continue statin agent. 5.  COPD.  Has albuterol as needed.  Would be a suboptimal surgical candidate for general anesthesia and we will try to manage her disease with endovascular therapy is much as possible.   [name], MD  04/19/2023 4:06 PM    This note was created with Dragon medical transcription system.  Any error is purely unintentional

## 2023-04-19 NOTE — Consult Note (Signed)
Hospital Consult    Reason for Consult:  Right Carotid Stenosis Requesting Physician:  Dr Brooke Dare MD MRN #:  [id number]  History of Present Illness: This is a 82 y.o. 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 Patient presents to [facility] emergency department with 2 episodes of left leg weakness. Neurology is concerned about limb shaking TIA vs Focal seizures. 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 Vascular Surgery was consulted to to evaluate.   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   Managed by nephrologist   Non-small cell lung cancer (NSCLC) (HCC)    Osteoporosis    Oxygen deficiency Me   Risk for falls    Situational disturbance    Stroke (HCC) 2011   tia's x 2   TIA (transient ischemic attack)     Past Surgical History:  Procedure Laterality Date   COLONOSCOPY  2016   ESOPHAGUS SURGERY     INTRAMEDULLARY (IM) NAIL INTERTROCHANTERIC Left 03/26/2017   Procedure: INTRAMEDULLARY (IM) NAIL  INTERTROCHANTRIC;  Surgeon: Kennedy Bucker, MD;  Location: [facility] ORS;  Service: Orthopedics;  Laterality: Left;   LEFT HEART CATH AND CORONARY ANGIOGRAPHY N/A 04/15/2021   Procedure: LEFT HEART CATH AND CORONARY ANGIOGRAPHY;  Surgeon: Iran Ouch, MD;  Location: [facility] INVASIVE CV LAB;  Service: Cardiovascular;  Laterality: N/A;   RIGHT/LEFT HEART CATH AND CORONARY ANGIOGRAPHY Bilateral 05/15/2022   Procedure: RIGHT/LEFT HEART CATH AND CORONARY ANGIOGRAPHY;  Surgeon: Iran Ouch, MD;  Location: [facility] INVASIVE CV LAB;  Service: Cardiovascular;  Laterality: Bilateral;   VIDEO BRONCHOSCOPY WITH ENDOBRONCHIAL NAVIGATION Right 06/28/2020   Procedure: VIDEO BRONCHOSCOPY WITH ENDOBRONCHIAL NAVIGATION CELLVIZIO;  Surgeon: Salena Saner, MD;  Location: [facility] ORS;  Service: Pulmonary;  Laterality: Right;    Allergies  Allergen Reactions   Zithromax [Azithromycin]    Cefuroxime Axetil Rash   Ciprofloxacin Nausea Only    Unknown   Codeine Other (See Comments)    Patient does not like how the medication makes you feel     Prior to Admission medications   Medication Sig Start Date End Date Taking? Authorizing Provider  acetaminophen (TYLENOL) 650 MG CR tablet Take 1 tablet (650 mg total) by mouth 4 (four) times daily as needed for pain. 01/30/20  Yes Danelle Berry, PA-C  albuterol (VENTOLIN HFA) 108 (90 Base) MCG/ACT inhaler Inhale 2 puffs into the lungs every 4 (four) hours as needed for wheezing or shortness of breath. 04/03/23  Yes Mecum, Erin E, PA-C  aspirin 81 MG EC tablet Take  81 mg by mouth at bedtime. Swallow whole.   Yes [provider]  atorvastatin (LIPITOR) 40 MG tablet Take 1 tablet (40 mg total) by mouth at bedtime. 04/03/23  Yes Mecum, Erin E, PA-C  busPIRone (BUSPAR) 5 MG tablet Take 1 tablet (5 mg total) by mouth 2 (two) times daily as needed (stress anxiety). 04/03/23  Yes Mecum, Oswaldo Conroy, PA-C  Calcium 200 MG TABS Take 300 mg by mouth every Monday, Tuesday, Wednesday,  Thursday, and Friday.   Yes [provider]  Cholecalciferol (VITAMIN D3) 50 MCG (2000 UT) TABS Take 2,000 Units by mouth at bedtime.   Yes [provider]  fluticasone furoate-vilanterol (BREO ELLIPTA) 100-25 MCG/ACT AEPB Inhale 1 puff into the lungs daily. 09/25/22  Yes Danelle Berry, PA-C  melatonin 3 MG TABS tablet Take 3 mg by mouth at bedtime.   Yes [provider]  Multiple Vitamin (MULTIVITAMIN) capsule Take 1 capsule by mouth daily.   Yes [provider]  PARoxetine (PAXIL) 40 MG tablet Take 1 tablet (40 mg total) by mouth every morning. 04/03/23  Yes Mecum, Erin E, PA-C  acetaminophen (TYLENOL) 325 MG tablet Take 2 tablets (650 mg total) by mouth every 4 (four) hours as needed for mild pain (pain score 1-3) (or temp > 37.5 C (99.5 F)). 04/19/23   Sunnie Nielsen, DO  albuterol (PROVENTIL) (2.5 MG/3ML) 0.083% nebulizer solution Take 3 mLs (2.5 mg total) by nebulization every 4 (four) hours as needed for shortness of breath or wheezing. 04/19/23   Sunnie Nielsen, DO  aspirin EC 81 MG tablet Take 1 tablet (81 mg total) by mouth at bedtime. Swallow whole. 04/19/23   Sunnie Nielsen, DO  calcium carbonate (TUMS - DOSED IN MG ELEMENTAL CALCIUM) 500 MG chewable tablet Chew 1.5 tablets (300 mg of elemental calcium total) by mouth every Monday, Tuesday, Wednesday, Thursday, and Friday. 04/19/23   Sunnie Nielsen, DO  enoxaparin (LOVENOX) 30 MG/0.3ML injection Inject 0.3 mLs (30 mg total) into the skin daily. 04/20/23   Sunnie Nielsen, DO  lamoTRIgine (LAMICTAL) 25 MG tablet Take 1 tablet (25 mg total) by mouth at bedtime. 04/19/23   Sunnie Nielsen, DO  melatonin 5 MG TABS Take 0.5 tablets (2.5 mg total) by mouth at bedtime. 04/19/23   Sunnie Nielsen, DO  Multiple Vitamin (MULTIVITAMIN WITH MINERALS) TABS tablet Take 1 tablet by mouth daily. 04/20/23   Sunnie Nielsen, DO  ondansetron [facility]) 4 MG/2ML SOLN injection Inject 2 mLs (4 mg  total) into the vein every 4 (four) hours as needed for nausea or vomiting. 04/19/23   Sunnie Nielsen, DO  senna-docusate (SENOKOT-S) 8.6-50 MG tablet Take 1 tablet by mouth at bedtime as needed for moderate constipation. 04/19/23   Sunnie Nielsen, DO  ticagrelor (BRILINTA) 90 MG TABS tablet Take 1 tablet (90 mg total) by mouth 2 (two) times daily. 04/19/23   Sunnie Nielsen, DO  traZODone (DESYREL) 50 MG tablet Take 0.5 tablets (25 mg total) by mouth at bedtime as needed for sleep. 04/19/23   Sunnie Nielsen, DO    Social History   Socioeconomic History   Marital status: Divorced    Spouse name: Not on file   Number of children: 4   Years of education: Not on file   Highest education level: GED or equivalent  Occupational History   Occupation: Retired    Comment: payroll  Tobacco Use   Smoking status: Former    Current packs/day: 0.00    Average packs/day: 1 pack/day for 61.0 years (  61.0 ttl pk-yrs)    Types: Cigarettes    Start date: 03/25/1956    Quit date: 03/25/2017    Years since quitting: 6.0   Smokeless tobacco: Never  Vaping Use   Vaping status: Never Used  Substance and Sexual Activity   Alcohol use: No   Drug use: No   Sexual activity: Not Currently  Other Topics Concern   Not on file  Social History Narrative   Full Code   4 children   Denies alcohol use   Patient has 1 daughters and 1 grandson living with her   Social Determinants of Health   Financial Resource Strain: Low Risk  (04/02/2023)   Overall Financial Resource Strain (CARDIA)    Difficulty of Paying Living Expenses: Not hard at all  Food Insecurity: No Food Insecurity (04/18/2023)   Hunger Vital Sign    Worried About Running Out of Food in the Last Year: Never true    Ran Out of Food in the Last Year: Never true  Transportation Needs: No Transportation Needs (04/18/2023)   PRAPARE - Administrator, Civil Service (Medical): No    Lack of Transportation (Non-Medical):  No  Physical Activity: Insufficiently Active (04/02/2023)   Exercise Vital Sign    Days of Exercise per Week: 3 days    Minutes of Exercise per Session: 30 min  Stress: Stress Concern Present (04/02/2023)   [employer] of Occupational Health - Occupational Stress Questionnaire    Feeling of Stress : To some extent  Social Connections: Unknown (04/02/2023)   Social Connection and Isolation Panel [NHANES]    Frequency of Communication with Friends and Family: More than three times a week    Frequency of Social Gatherings with Friends and Family: More than three times a week    Attends Religious Services: Patient declined    Database administrator or Organizations: Yes    Attends Engineer, structural: More than 4 times per year    Marital Status: Divorced  Intimate Partner Violence: Not At Risk (04/18/2023)   Humiliation, Afraid, Rape, and Kick questionnaire    Fear of Current or Ex-Partner: No    Emotionally Abused: No    Physically Abused: No    Sexually Abused: No     Family History  Problem Relation Age of Onset   Other Mother        [place] posioning   Kidney disease Mother    Heart attack Father    Prostate cancer Father    Cancer Father    Heart disease Father    Heart attack Brother    Diabetes Brother    Hypertension Daughter    Kidney cancer Neg Hx    Bladder Cancer Neg Hx    Colon cancer Neg Hx    Colon polyps Neg Hx    Rectal cancer Neg Hx     ROS: Otherwise negative unless mentioned in HPI  Physical Examination  Vitals:   04/19/23 0351 04/19/23 0804  BP: (!) 145/82 (!) 162/100  Pulse: 99 91  Resp: 20 18  Temp: 97.9 F (36.6 C) 98 F (36.7 C)  SpO2: 100% 99%   Body mass index is 22.06 kg/m.  General:  WDWN in NAD Gait: Not observed HENT: WNL, normocephalic Pulmonary: normal non-labored breathing, without Rales, rhonchi,  wheezing Cardiac: regular, without  Murmurs, rubs or gallops; with carotid bruits Abdomen: Positive bowel  sounds throughout, soft, NT/ND, no masses Skin: without rashes Vascular Exam/Pulses: Palpable Pulses throughout.  Extremities: without ischemic changes, without Gangrene , without cellulitis; without open wounds;  Musculoskeletal: no muscle wasting or atrophy  Neurologic: A&O X 3;  No focal weakness or paresthesias are detected; speech is fluent/normal. Grip strength 5/5 bilaterally. 5/5 Lower extremity strength.  Psychiatric:  The pt has Normal affect. Lymph:  Unremarkable  CBC    Component Value Date/Time   WBC 5.5 04/17/2023 1843   RBC 3.59 (L) 04/17/2023 1843   HGB 12.1 04/17/2023 1843   HGB 12.8 04/13/2021 1122   HCT 36.5 04/17/2023 1843   HCT 38.0 04/13/2021 1122   PLT 199 04/17/2023 1843   PLT 235 04/13/2021 1122   MCV 101.7 (H) 04/17/2023 1843   MCV 98 (H) 04/13/2021 1122   MCV 100 07/03/2014 0526   MCH 33.7 04/17/2023 1843   MCHC 33.2 04/17/2023 1843   RDW 13.5 04/17/2023 1843   RDW 11.9 04/13/2021 1122   RDW 13.5 07/03/2014 0526   LYMPHSABS 1.2 01/25/2022 1340   LYMPHSABS 2.9 01/15/2015 1500   LYMPHSABS 2.8 07/03/2014 0526   MONOABS 0.4 01/25/2022 1340   MONOABS 0.5 07/03/2014 0526   EOSABS 0.2 01/25/2022 1340   EOSABS 0.3 01/15/2015 1500   EOSABS 0.3 07/03/2014 0526   BASOSABS 0.0 01/25/2022 1340   BASOSABS 0.0 01/15/2015 1500   BASOSABS 0.1 07/03/2014 0526    BMET    Component Value Date/Time   NA 136 04/19/2023 0838   NA 141 04/13/2021 1122   NA 141 07/03/2014 0526   K 4.3 04/19/2023 0838   K 3.9 07/03/2014 0526   CL 102 04/19/2023 0838   CL 109 (H) 07/03/2014 0526   CO2 26 04/19/2023 0838   CO2 25 07/03/2014 0526   GLUCOSE 106 (H) 04/19/2023 0838   GLUCOSE 90 07/03/2014 0526   BUN 28 (H) 04/19/2023 0838   BUN 25 04/13/2021 1122   BUN 15 07/03/2014 0526   CREATININE 1.80 (H) 04/19/2023 0838   CREATININE 1.54 (H) 05/03/2020 1359   CALCIUM 9.4 04/19/2023 0838   CALCIUM 9.3 07/03/2014 0526   GFRNONAA 28 (L) 04/19/2023 0838   GFRNONAA 32 (L)  05/03/2020 1359   GFRAA 37 (L) 05/03/2020 1359    COAGS: Lab Results  Component Value Date   INR 0.9 03/17/2020   INR 1.9 (A) 03/17/2019   INR 2.7 02/18/2019     Non-Invasive Vascular Imaging:   EXAM:04/18/23 CT ANGIOGRAPHY HEAD AND NECK WITH AND WITHOUT CONTRAST   TECHNIQUE: Multidetector CT imaging of the head and neck was performed using the standard protocol during bolus administration of intravenous contrast. Multiplanar CT image reconstructions and MIPs were obtained to evaluate the vascular anatomy. Carotid stenosis measurements (when applicable) are obtained utilizing NASCET criteria, using the distal internal carotid diameter as the denominator.   RADIATION DOSE REDUCTION: This exam was performed according to the departmental dose-optimization program which includes automated exposure control, adjustment of the mA and/or kV according to patient size and/or use of iterative reconstruction technique.   CONTRAST:  75mL OMNIPAQUE IOHEXOL 350 MG/ML SOLN   COMPARISON:  07/03/2014 CTA and, correlation is made with 04/18/2023 MRI head and MRA head and 04/17/2023 CT head   FINDINGS: CT HEAD FINDINGS   Brain: The infarcts noted on the recent MRI are not apparent on CT. No evidence of new acute infarct, hemorrhage, mass, mass effect, or midline shift. No hydrocephalus or extra-axial fluid collection. Periventricular white matter changes, likely the sequela of chronic small vessel ischemic disease.   Vascular: No hyperdense vessel. Atherosclerotic calcifications  in the intracranial carotid and vertebral arteries.   Skull: Negative for fracture or focal lesion.   Sinuses/Orbits: Mucosal thickening in the ethmoid air cells. No acute finding in the orbits.   Other: The mastoid air cells are well aerated.   CTA NECK FINDINGS   Aortic arch: Standard branching. Imaged portion shows no evidence of aneurysm or dissection. Evaluation of the arch vessel origins  is limited by beam hardening artifact from the adjacent contrast bolus, but there is at least 50% stenosis at each of the vessel origins.   Right carotid system: No evidence of dissection, occlusion, or hemodynamically significant stenosis (greater than 50%). Atherosclerotic disease at the bifurcation and in the proximal and distal ICA is not hemodynamically significant. Comparatively poor opacification of the right ICA compared to the left may be related to the degree of stenosis at the origin of the brachiocephalic artery.   Left carotid system: No evidence of dissection, occlusion, or hemodynamically significant stenosis (greater than 50%). Atherosclerotic disease at the bifurcation and in the proximal and distal ICA is not hemodynamically significant.   Vertebral arteries: Poor visualization of the origin of the right vertebral artery secondary to the adjacent contrast bolus. Within that limitation, there is likely severe stenosis at the origin of the right vertebral artery, which is very poorly opacified until the V3 segment (series 10, image 27). Moderate stenosis at the origin of left vertebral artery, which is non dominant. The left vertebral artery is patent through the skull base. No evidence of dissection.   Skeleton: No acute osseous abnormality. Degenerative changes in the cervical spine.   Other neck: No acute finding.   Upper chest: Emphysema. No focal pulmonary opacity or pleural effusion.   Review of the MIP images confirms the above findings   CTA HEAD FINDINGS   Anterior circulation: Both internal carotid arteries are patent to the termini, with calcifications but without significant stenosis, although the right ICA is poorly opacified. 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   A1 segments  patent. Normal anterior communicating artery. Anterior cerebral arteries are patent to their distal aspects without significant stenosis.   No M1 stenosis or occlusion. MCA branches perfused to their distal aspects without significant stenosis.   Opacification of the right ACA and MCA is improved compared to the right ICA, likely reflecting collateral flow from the right posterior communicating artery.   Posterior circulation: Moderate to severe stenosis in the proximal left V4 (series 10, images 155, 152, and 145). The left vertebral artery primarily supplies the left PICA, which is patent. Mild stenosis in the proximal right V4, which is otherwise patent to the vertebrobasilar junction. The right PICA is also patent. Given poor opacification of the extracranial V4, this is likely retrograde.   Basilar patent to its distal aspect without significant stenosis. Superior cerebellar arteries patent proximally.   Patent P1 segments. The bilateral posterior communicating arteries are patent. PCAs perfused to their distal aspects without significant stenosis.   Venous sinuses: As permitted by contrast timing, patent.   Anatomic variants: None significant.   No evidence of aneurysm or vascular malformation.   Review of the MIP images confirms the above findings   IMPRESSION: 1. The infarcts noted on the recent MRI are not apparent on CT. No evidence of new acute infarct. 2. No intracranial large vessel occlusion. 3.  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 4. Poor opacification of the right ICA, which may be related to stenosis at the origin of the brachiocephalic artery. Improved opacification of the right ACA and MCA is likely collateral flow from the posterior communicating artery. 5. Moderate stenosis at the origin of the left vertebral  artery, with additional moderate to severe stenosis in the proximal left V4. 6. 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 7. Evaluation of the arch vessel origins is limited by beam hardening artifact from the adjacent contrast bolus, but there is likely at least 50% stenosis at each of the vessel origins.  ECHO  1. Left ventricular ejection fraction, by estimation, is 60 to 65%. The  left ventricle has normal function. The left ventricle has no regional  wall motion abnormalities. There is mild left ventricular hypertrophy.  Left ventricular diastolic parameters  are consistent with Grade I diastolic dysfunction (impaired relaxation).   2. Right ventricular systolic function is normal. The right ventricular  size is normal.   3. The mitral valve is normal in structure. Mild to moderate mitral valve  regurgitation. No evidence of mitral stenosis.   4. The aortic valve was not well visualized. Aortic valve regurgitation  is not visualized. Aortic valve sclerosis/calcification is present,  without any evidence of aortic stenosis. Aortic valve area, by VTI  measures 2.03 cm. Aortic valve mean gradient  measures 4.5 mmHg.   5. The inferior vena cava is normal in size with greater than 50%  respiratory variability, suggesting right atrial pressure of 3 mmHg.   Statin:  Yes.   Beta Blocker:  No. Aspirin:  Yes.   ACEI:  No. ARB:  No. CCB use:  No Other antiplatelets/anticoagulants:  Yes.   Brillinta 90 mg Daily   ASSESSMENT/PLAN: This is a 82 y.o. female [facility] emergency department after what appears to be a TIA versus focal seizures.  Patient underwent evaluation with CTA of the head and neck with contrast was found to have right carotid stenosis with brachiocephalic severe stenosis.  Vascular surgery plans on taking the patient to the vascular lab tomorrow on 04/20/2023 for  innominate artery angiogram with stent placement right carotid angiogram with possible stent placement.  I discussed in detail with the patient and her daughter at the bedside today the procedure, benefits, risks, and complications.  Both would like to proceed as soon as possible.  I answered all her questions this morning.  Patient will be made n.p.o. after midnight tonight for procedure tomorrow.   -I discussed the case in detail with Dr. [name] MD and he agrees with the plan.   Marcie [facility] 04/19/2023 12:00 PM

## 2023-04-19 NOTE — Consult Note (Signed)
[facility] Vascular Consult Note  MRN : [id number]  Christine Burnett is a 82 y.o. (1940/10/22) female who presents with chief complaint of  Chief Complaint  Patient presents with   Weakness  .  History of Present Illness: I am asked to see the patient by Dr. Lyn Hollingshead for evaluation of recent TIA symptoms with significant cerebrovascular disease.  The patient has had 2 episodes of confusion associated with left sided weakness.  Her symptoms were transient and she resolved to her baseline shortly after the initiation of symptoms.  She has been walking in the halls without any limitation.  She has no current confusion.  She is accompanied by her family today who are comfortable that she is essentially at her baseline.  She has undergone a litany of imaging studies many of which I have reviewed.  She has an MRI which shows a potential tiny punctate area of ischemic infarct and otherwise just chronic atrophy.  Her CT angiogram was notable for what appeared to be a high-grade innominate artery stenosis.  The opacification of the more distal right internal carotid artery was fairly poor and this correlated with her duplex study which showed monophasic flow in her right common carotid artery consistent with proximal stenosis.  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  Current Facility-Administered Medications  Medication Dose Route Frequency Provider Last Rate Last Admin   acetaminophen (TYLENOL) tablet 650 mg  650 mg Oral Q4H PRN Mansy, Vernetta Honey, MD       Or   acetaminophen (TYLENOL) 160 MG/5ML solution 650 mg  650 mg Per Tube Q4H PRN Mansy, Vernetta Honey, MD       Or   acetaminophen (TYLENOL) suppository 650 mg  650 mg Rectal Q4H PRN Mansy, Jan A, MD       albuterol (PROVENTIL) (2.5 MG/3ML) 0.083% nebulizer  solution 2.5 mg  2.5 mg Nebulization Q4H PRN Otelia Sergeant, RPH       aspirin EC tablet 81 mg  81 mg Oral QHS Mansy, Jan A, MD   81 mg at 04/18/23 2030   atorvastatin (LIPITOR) tablet 40 mg  40 mg Oral QHS Mansy, Jan A, MD   40 mg at 04/18/23 2029   calcium carbonate (TUMS - dosed in mg elemental calcium) chewable tablet 300 mg of elemental calcium  300 mg of elemental calcium Oral Q MTWThF Mansy, Jan A, MD       cholecalciferol (VITAMIN D3) tablet 2,000 Units  2,000 Units Oral QHS Mansy, Jan A, MD   2,000 Units at 04/18/23 2029   enoxaparin (LOVENOX) injection 30 mg  30 mg Subcutaneous Q24H Mansy, Jan A, MD   30 mg at 04/19/23 0848   lamoTRIgine (LAMICTAL) tablet 25 mg  25 mg Oral QHS Bhagat, Srishti L, MD   25 mg at 04/18/23 2029   melatonin tablet 2.5 mg  2.5 mg Oral QHS Mansy, Jan A, MD   2.5 mg at 04/18/23 2029   multivitamin with minerals tablet 1 tablet  1 tablet Oral Daily Mansy, Jan A, MD   1 tablet at 04/19/23 0849   ondansetron [facility]) injection 4 mg  4 mg Intravenous Q4H PRN Mansy, Jan A, MD       PARoxetine (PAXIL) tablet 40 mg  40 mg Oral q morning Mansy, Jan  A, MD   40 mg at 04/19/23 0849   senna-docusate (Senokot-S) tablet 1 tablet  1 tablet Oral QHS PRN Mansy, Vernetta Honey, MD       ticagrelor (BRILINTA) tablet 180 mg  180 mg Oral Once Bhagat, Srishti L, MD       Followed by   ticagrelor (BRILINTA) tablet 90 mg  90 mg Oral BID Bhagat, Srishti L, MD       traZODone (DESYREL) tablet 25 mg  25 mg Oral QHS PRN Mansy, Vernetta Honey, MD        Past Medical History:  Diagnosis Date   Allergic rhinitis    Anxiety    Arthritis Me   Chronic bronchitis (HCC)    Chronic kidney disease, stage III (moderate) (HCC) 08/31/2017   Seeing nephrologist   CKD (chronic kidney disease), stage III (HCC)    COPD (chronic obstructive pulmonary disease) (HCC)    Cornea scar    Depression    unspecified   Diverticulosis    Emphysema of lung (HCC) Me   Hx of fracture of left hip 12/24/2017   Hyperlipidemia     Hyperparathyroidism, 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   Managed by nephrologist   Non-small cell lung cancer (NSCLC) (HCC)    Osteoporosis    Oxygen deficiency Me   Risk for falls    Situational disturbance    Stroke (HCC) 2011   tia's x 2   TIA (transient ischemic attack)     Past Surgical History:  Procedure Laterality Date   COLONOSCOPY  2016   ESOPHAGUS SURGERY     INTRAMEDULLARY (IM) NAIL INTERTROCHANTERIC Left 03/26/2017   Procedure: INTRAMEDULLARY (IM) NAIL INTERTROCHANTRIC;  Surgeon: Kennedy Bucker, MD;  Location: [facility] ORS;  Service: Orthopedics;  Laterality: Left;   LEFT HEART CATH AND CORONARY ANGIOGRAPHY N/A 04/15/2021   Procedure: LEFT HEART CATH AND CORONARY ANGIOGRAPHY;  Surgeon: Iran Ouch, MD;  Location: [facility] INVASIVE CV LAB;  Service: Cardiovascular;  Laterality: N/A;   RIGHT/LEFT HEART CATH AND CORONARY ANGIOGRAPHY Bilateral 05/15/2022   Procedure: RIGHT/LEFT HEART CATH AND CORONARY ANGIOGRAPHY;  Surgeon: Iran Ouch, MD;  Location: [facility] INVASIVE CV LAB;  Service: Cardiovascular;  Laterality: Bilateral;   VIDEO BRONCHOSCOPY WITH ENDOBRONCHIAL NAVIGATION Right 06/28/2020   Procedure: VIDEO BRONCHOSCOPY WITH ENDOBRONCHIAL NAVIGATION CELLVIZIO;  Surgeon: Salena Saner, MD;  Location: [facility] ORS;  Service: Pulmonary;  Laterality: Right;     Social History   Tobacco Use   Smoking status: Former    Current packs/day: 0.00    Average packs/day: 1 pack/day for 61.0 years (61.0 ttl pk-yrs)    Types: Cigarettes    Start date: 03/25/1956    Quit date: 03/25/2017    Years since quitting: 6.0   Smokeless tobacco: Never  Vaping Use   Vaping status: Never Used  Substance Use Topics   Alcohol use: No   Drug use: No    Family History  Problem Relation Age of Onset   Other Mother        Ureme[place] posioning   Kidney disease Mother    Heart attack Father    Prostate cancer Father    Cancer Father    Heart disease Father    Heart attack Brother     Diabetes Brother    Hypertension Daughter    Kidney cancer Neg Hx    Bladder Cancer Neg Hx    Colon cancer Neg Hx    Colon polyps Neg Hx    Rectal cancer Neg Hx  Allergies  Allergen Reactions   Zithromax [Azithromycin]    Cefuroxime Axetil Rash   Ciprofloxacin Nausea Only    Unknown   Codeine Other (See Comments)    Patient does not like how the medication makes you feel      REVIEW OF SYSTEMS (Negative unless checked)  Constitutional: [] Weight loss  [] Fever  [] Chills Cardiac: [] Chest pain   [] Chest pressure   [] Palpitations   [] Shortness of breath when laying flat   [] Shortness of breath at rest   [] Shortness of breath with exertion. Vascular:  [] Pain in legs with walking   [] Pain in legs at rest   [] Pain in legs when laying flat   [] Claudication   [] Pain in feet when walking  [] Pain in feet at rest  [] Pain in feet when laying flat   [] History of DVT   [] Phlebitis   [] Swelling in legs   [] Varicose veins   [] Non-healing ulcers Pulmonary:   [] Uses home oxygen   [] Productive cough   [] Hemoptysis   [] Wheeze  [] COPD   [] Asthma Neurologic:  [] Dizziness  [] Blackouts   [] Seizures   [] History of stroke   [x] History of TIA  [] Aphasia   [] Temporary blindness   [] Dysphagia   [] Weakness or numbness in arms   [] Weakness or numbness in legs Musculoskeletal:  [x] Arthritis   [] Joint swelling   [x] Joint pain   [] Low back pain Hematologic:  [] Easy bruising  [] Easy bleeding   [] Hypercoagulable state   [] Anemic  [] Hepatitis Gastrointestinal:  [] Blood in stool   [] Vomiting blood  [] Gastroesophageal reflux/heartburn   [] Difficulty swallowing. Genitourinary:  [x] Chronic kidney disease   [] Difficult urination  [] Frequent urination  [] Burning with urination   [] Blood in urine Skin:  [] Rashes   [] Ulcers   [] Wounds Psychological:  [x] History of anxiety   []  History of major depression.  Physical Examination  Vitals:   04/18/23 1947 04/19/23 0019 04/19/23 0351 04/19/23 0804  BP: (!) 177/86 (!) 160/96  (!) 145/82 (!) 162/100  Pulse: 96 92 99 91  Resp: 17 18 20 18   Temp: 98.2 F (36.8 C) 98.2 F (36.8 C) 97.9 F (36.6 C) 98 F (36.7 C)  TempSrc:      SpO2: 97% 94% 100% 99%  Weight:      Height:       Body mass index is 22.06 kg/m. Gen:  WD/WN, NAD Head: [place]/AT, No temporalis wasting.  Ear/Nose/Throat: Hearing grossly intact, nares w/o erythema or drainage, oropharynx w/o Erythema/Exudate Eyes: Sclera non-icteric, conjunctiva clear Neck: Trachea midline.  No JVD.  Pulmonary:  Good air movement, respirations not labored, equal bilaterally.  Cardiac: RRR, normal S1, S2. Vascular:  Vessel Right Left  Radial Palpable Palpable   Musculoskeletal: M/S 5/5 throughout.  Extremities without ischemic changes.  No deformity or atrophy. No edema. Neurologic: Sensation grossly intact in extremities.  Symmetrical.  Speech is fluent. Motor exam as listed above. Psychiatric: Judgment intact, Mood & affect appropriate for pt's clinical situation. Dermatologic: No rashes or ulcers noted.  No cellulitis or open wounds.      CBC Lab Results  Component Value Date   WBC 5.5 04/17/2023   HGB 12.1 04/17/2023   HCT 36.5 04/17/2023   MCV 101.7 (H) 04/17/2023   PLT 199 04/17/2023    BMET    Component Value Date/Time   NA 136 04/19/2023 0838   NA 141 04/13/2021 1122   NA 141 07/03/2014 0526   K 4.3 04/19/2023 0838   K 3.9 07/03/2014 0526   CL 102 04/19/2023 2130  CL 109 (H) 07/03/2014 0526   CO2 26 04/19/2023 0838   CO2 25 07/03/2014 0526   GLUCOSE 106 (H) 04/19/2023 0838   GLUCOSE 90 07/03/2014 0526   BUN 28 (H) 04/19/2023 0838   BUN 25 04/13/2021 1122   BUN 15 07/03/2014 0526   CREATININE 1.80 (H) 04/19/2023 0838   CREATININE 1.54 (H) 05/03/2020 1359   CALCIUM 9.4 04/19/2023 0838   CALCIUM 9.3 07/03/2014 0526   GFRNONAA 28 (L) 04/19/2023 0838   GFRNONAA 32 (L) 05/03/2020 1359   GFRAA 37 (L) 05/03/2020 1359   Estimated Creatinine Clearance: 22.2 mL/min (A) (by C-G formula  based on SCr of 1.8 mg/dL (H)).  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 EEG to evaluate for seizure Level of alertness: Awake, asleep AEDs during EEG study: None Technical aspects: This EEG study was done with scalp electrodes positioned according to the 10-20 International system of electrode placement. Electrical activity was reviewed with band pass filter of 1-70Hz , sensitivity of 7 uV/mm, display speed of 71mm/sec with a 60Hz  notched filter applied as appropriate. EEG data were recorded continuously and digitally stored.  Video monitoring was available and reviewed as appropriate. Description: The posterior dominant rhythm consists of 8 Hz activity of moderate voltage (25-35 uV) seen predominantly in posterior head regions, symmetric and reactive to eye opening and eye closing. Sleep was characterized by vertex waves, sleep spindles (12 to 14 Hz), maximal frontocentral region. EEG showed intermittent generalized 3 to 6 Hz theta-delta slowing. Hyperventilation and photic stimulation were not performed.   ABNORMALITY - Intermittent slow, generalized IMPRESSION: This study is suggestive of mild diffuse encephalopathy. No seizures or epileptiform discharges were seen throughout the recording. Charlsie Quest   CT ANGIO HEAD NECK W WO CM  Result Date: 04/18/2023 CLINICAL DATA:  Abnormality in the basilar artery on MRA, possible acute infarcts on MRI EXAM: CT ANGIOGRAPHY HEAD AND NECK WITH AND WITHOUT CONTRAST TECHNIQUE: Multidetector CT imaging of  the head and neck was performed using the standard protocol during bolus administration of intravenous contrast. Multiplanar CT image reconstructions and MIPs were obtained to evaluate the vascular anatomy. Carotid stenosis measurements (when applicable) are obtained utilizing NASCET criteria, using the distal internal carotid diameter as the denominator. RADIATION DOSE REDUCTION: This exam was performed according to the departmental dose-optimization program which includes automated exposure control, adjustment of the mA and/or kV according to patient size and/or use of iterative reconstruction technique. CONTRAST:  75mL OMNIPAQUE IOHEXOL 350 MG/ML SOLN COMPARISON:  07/03/2014 CTA and, correlation is made with 04/18/2023 MRI head and MRA head and 04/17/2023 CT head FINDINGS: CT HEAD FINDINGS Brain: The infarcts noted on the recent MRI are not apparent on CT. No evidence of new acute infarct, hemorrhage, mass, mass effect, or midline shift. No hydrocephalus or extra-axial fluid collection. Periventricular white matter changes, likely the sequela of chronic small vessel ischemic disease. Vascular: No hyperdense vessel. Atherosclerotic calcifications in the intracranial carotid and vertebral arteries. Skull: Negative for fracture or focal lesion. Sinuses/Orbits: Mucosal thickening in the  ethmoid air cells. No acute finding in the orbits. Other: The mastoid air cells are well aerated. CTA NECK FINDINGS Aortic arch: Standard branching. Imaged portion shows no evidence of aneurysm or dissection. Evaluation of the arch vessel origins is limited by beam hardening artifact from the adjacent contrast bolus, but there is at least 50% stenosis at each of the vessel origins. Right carotid system: No evidence of dissection, occlusion, or hemodynamically significant stenosis (greater than 50%). Atherosclerotic disease at the bifurcation and in the proximal and distal ICA is not hemodynamically significant. Comparatively poor  opacification of the right ICA compared to the left may be related to the degree of stenosis at the origin of the brachiocephalic artery. Left carotid system: No evidence of dissection, occlusion, or hemodynamically significant stenosis (greater than 50%). Atherosclerotic disease at the bifurcation and in the proximal and distal ICA is not hemodynamically significant. Vertebral arteries: Poor visualization of the origin of the right vertebral artery secondary to the adjacent contrast bolus. Within that limitation, there is likely severe stenosis at the origin of the right vertebral artery, which is very poorly opacified until the V3 segment (series 10, image 27). Moderate stenosis at the origin of left vertebral artery, which is non dominant. The left vertebral artery is patent through the skull base. No evidence of dissection. Skeleton: No acute osseous abnormality. Degenerative changes in the cervical spine. Other neck: No acute finding. Upper chest: Emphysema. No focal pulmonary opacity or pleural effusion. Review of the MIP images confirms the above findings CTA HEAD FINDINGS Anterior circulation: Both internal carotid arteries are patent to the termini, with calcifications but without significant stenosis, although the right ICA is poorly opacified. 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 A1 segments patent. Normal anterior communicating artery. Anterior cerebral arteries are patent to their distal aspects without significant stenosis. No M1 stenosis or occlusion. MCA branches perfused to their distal aspects without significant stenosis. Opacification of the right ACA and MCA is improved compared to the right ICA, likely reflecting collateral flow from the right posterior communicating artery. Posterior circulation: Moderate to severe stenosis in the  proximal left V4 (series 10, images 155, 152, and 145). The left vertebral artery primarily supplies the left PICA, which is patent. Mild stenosis in the proximal right V4, which is otherwise patent to the vertebrobasilar junction. The right PICA is also patent. Given poor opacification of the extracranial V4, this is likely retrograde. Basilar patent to its distal aspect without significant stenosis. Superior cerebellar arteries patent proximally. Patent P1 segments. The bilateral posterior communicating arteries are patent. PCAs perfused to their distal aspects without significant stenosis. Venous sinuses: As permitted by contrast timing, patent. Anatomic variants: None significant. No evidence of aneurysm or vascular malformation. Review of the MIP images confirms the above findings IMPRESSION: 1. The infarcts noted on the recent MRI are not apparent on CT. No evidence of new acute infarct. 2. No intracranial large vessel occlusion. 3. 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 4. Poor opacification of the right ICA, which may be related to stenosis at the origin of the brachiocephalic artery. Improved opacification of the right ACA and MCA is likely collateral flow from the posterior communicating artery. 5. Moderate stenosis at the  origin of the left vertebral artery, with additional moderate to severe stenosis in the proximal left V4. 6. 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 7. Evaluation of the arch vessel origins is limited by beam hardening artifact from the adjacent contrast bolus, but there is likely at least 50% stenosis at each of the vessel origins. \ Electronically Signed   By: Wiliam Ke M.D.   On: 04/18/2023 19:41   US Carotid Bilateral  Result Date:  04/18/2023 CLINICAL DATA:  TIA Hyperlipidemia Former tobacco user EXAM: BILATERAL CAROTID DUPLEX ULTRASOUND TECHNIQUE: Wallace Cullens scale imaging, color Doppler and duplex ultrasound were performed of bilateral carotid and vertebral arteries in the neck. COMPARISON:  07/29/2018 FINDINGS: Criteria: Quantification of carotid stenosis is based on velocity parameters that correlate the residual internal carotid diameter with NASCET-based stenosis levels, using the diameter of the distal internal carotid lumen as the denominator for stenosis measurement. The following velocity measurements were obtained: RIGHT ICA: 39/4 cm/sec CCA: 18/11 cm/sec SYSTOLIC ICA/CCA RATIO:  2.2 ECA: 50 cm/sec LEFT ICA: 242/36 cm/sec CCA: 77/15 cm/sec SYSTOLIC ICA/CCA RATIO:  3.1 ECA: 156 cm/sec RIGHT CAROTID ARTERY: Mild calcified plaque of the distal common carotid artery. Common carotid artery waveform is abnormal and monophasic. Internal carotid artery waveform is abnormal with reversal of systolic flow. RIGHT VERTEBRAL ARTERY:  Bidirectional flow. LEFT CAROTID ARTERY: Bulky calcified plaque noted at the distal left common and proximal internal carotid arteries. Common carotid artery waveform is abnormal and monophasic. LEFT VERTEBRAL ARTERY:  Antegrade flow. Right upper extremity blood pressure is significantly lower than left with right side measuring 130/98 mm Hg and left measuring 164/82 mm Hg. IMPRESSION: 1. Monophasic waveform in the right common carotid artery and reversal of systolic flow in right internal carotid artery consistent with significant proximal stenosis. The site of significant origin of the right brachiocephalic artery asymmetrically decreased right upper extremity blood pressure as well as bidirectional flow in the right vertebral artery. 2. Monophasic waveform in the left common carotid artery consistent with significant stenosis at the origin of the common carotid artery. 3. 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 4. Further evaluation with CT angiography of the neck is recommended. Electronically Signed   By: Acquanetta Belling M.D.   On: 04/18/2023 15:32   MR ANGIO HEAD WO CONTRAST  Result Date: 04/18/2023 CLINICAL DATA:  Neuro deficit, acute, stroke suspected EXAM: MRA HEAD WITHOUT CONTRAST TECHNIQUE: Angiographic images of the Circle of Willis were acquired using MRA technique without intravenous contrast. 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 Normal flow void was visualized on same day brain MRI. There is reconstitution of the level of the ICA terminus via collaterals from the circle-of-Willis. There is normal arborization in the bilateral MCA territories. Posterior circulation: There is nearly absent flow void in the basilar artery with robust PCOM bilaterally. P2 and P3 segments demonstrate normal flow signal. Anatomic variants: Fetal PCAs bilaterally. Other: None. IMPRESSION: 1. Asymmetrically decreased flow void in the right petrous, cavernous, and supraclinoid ICA is worrisome for interval development of a new right ICA occlusion. There is reconstitution of the level of the ICA terminus via collaterals from the circle-of-Willis. Recommend further evaluation with a CT head and neck  angiogram 2. Nearly absent flow void in the basilar artery with robust PCOM bilaterally. 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 Recommend attention on CTA. These results will be called to the ordering clinician or representative by the Radiologist Assistant, and communication documented in the PACS or [employer]. Electronically Signed   By: Lorenza Cambridge M.D.   On: 04/18/2023 14:48   ECHOCARDIOGRAM COMPLETE BUBBLE STUDY  Result Date: 04/18/2023    ECHOCARDIOGRAM REPORT    Patient Name:   Christine Burnett Date of Exam: 04/18/2023 Medical Rec #:  [id number]          Height:       66.0 in Accession #:    [id number]         Weight:       136.7 lb Date of Birth:  June 10, 1940          BSA:          1.701 m Patient Age:    82 years           BP:           149/74 mmHg Patient Gender: F                  HR:           93 bpm. Exam Location:  [facility] Procedure: 2D Echo, Cardiac Doppler, Color Doppler and Saline Contrast Bubble            Study Indications:     TIA 435.9 / G45.9  History:         Patient has prior history of Echocardiogram examinations, most                  recent 01/26/2021. COPD, TIA and Stroke; Risk                  Factors:Dyslipidemia.  Sonographer:     Cristela Blue Referring Phys:  [id number] JAN A MANSY Diagnosing Phys: Julien Nordmann MD IMPRESSIONS  1. Left ventricular ejection fraction, by estimation, is 55 to 60%. The left ventricle has normal function. The left ventricle has no regional wall motion abnormalities. Left ventricular diastolic parameters are consistent with Grade I diastolic dysfunction (impaired relaxation).  2. Right ventricular systolic function is normal. The right ventricular size is normal. There is normal pulmonary artery systolic pressure. The estimated right ventricular systolic pressure is 33.3 mmHg.  3. The mitral valve is normal in structure. Moderate mitral valve regurgitation. No evidence of mitral stenosis.  4. Tricuspid valve regurgitation is moderate.  5. The aortic valve is normal in structure. Aortic valve regurgitation is mild. Aortic valve sclerosis/calcification is present, without any evidence of aortic stenosis.  6. The inferior vena cava is normal in size with greater than 50% respiratory variability, suggesting right atrial pressure of 3 mmHg.  7. Agitated saline contrast bubble study was negative, with no evidence of any interatrial shunt. FINDINGS  Left Ventricle: Left ventricular ejection fraction, by estimation, is 55 to 60%. The  left ventricle has normal function. The left ventricle has no regional wall motion abnormalities. The left ventricular internal cavity size was normal in size. There is  no left ventricular hypertrophy. Left ventricular diastolic parameters are consistent with Grade I diastolic dysfunction (impaired relaxation). Right Ventricle: The right ventricular size is normal. No increase in right ventricular wall thickness. Right ventricular systolic function is normal. There is normal pulmonary artery systolic pressure. The tricuspid regurgitant velocity is 2.66  m/s, and  with an assumed right atrial pressure of 5 mmHg, the estimated right ventricular systolic pressure is 33.3 mmHg. Left Atrium: Left atrial size was normal in size. Right Atrium: Right atrial size was normal in size. Pericardium: There is no evidence of pericardial effusion. Mitral Valve: The mitral valve is normal in structure. There is mild calcification of the mitral valve leaflet(s). Moderate mitral valve regurgitation. No evidence of mitral valve stenosis. MV peak gradient, 10.4 mmHg. The mean mitral valve gradient is 4.0 mmHg. Tricuspid Valve: The tricuspid valve is normal in structure. Tricuspid valve regurgitation is moderate . No evidence of tricuspid stenosis. Aortic Valve: The aortic valve is normal in structure. Aortic valve regurgitation is mild. Aortic valve sclerosis/calcification is present, without any evidence of aortic stenosis. Aortic valve mean gradient measures 4.2 mmHg. Aortic valve peak gradient measures 7.0 mmHg. Aortic valve area, by VTI measures 2.26 cm. Pulmonic Valve: The pulmonic valve was normal in structure. Pulmonic valve regurgitation is not visualized. No evidence of pulmonic stenosis. Aorta: The aortic root is normal in size and structure. Venous: The inferior vena cava is normal in size with greater than 50% respiratory variability, suggesting right atrial pressure of 3 mmHg. IAS/Shunts: No atrial level shunt detected by  color flow Doppler. Agitated saline contrast was given intravenously to evaluate for intracardiac shunting. Agitated saline contrast bubble study was negative, with no evidence of any interatrial shunt. There  is no evidence of a patent foramen ovale. There is no evidence of an atrial septal defect.  LEFT VENTRICLE PLAX 2D LVIDd:         4.10 cm   Diastology LVIDs:         2.40 cm   LV e' medial:    5.11 cm/s LV PW:         1.00 cm   LV E/e' medial:  13.5 LV IVS:        1.00 cm   LV e' lateral:   5.98 cm/s LVOT diam:     2.00 cm   LV E/e' lateral: 11.6 LV SV:         59 LV SV Index:   35 LVOT Area:     3.14 cm  RIGHT VENTRICLE RV Basal diam:  2.20 cm RV Mid diam:    1.50 cm LEFT ATRIUM           Index        RIGHT ATRIUM          Index LA diam:      2.40 cm 1.41 cm/m   RA Area:     9.17 cm LA Vol (A2C): 18.4 ml 10.82 ml/m  RA Volume:   16.70 ml 9.82 ml/m LA Vol (A4C): 26.5 ml 15.58 ml/m  AORTIC VALVE AV Area (Vmax):    1.84 cm AV Area (Vmean):   1.76 cm AV Area (VTI):     2.26 cm AV Vmax:           132.50 cm/s AV Vmean:          97.350 cm/s AV VTI:            0.260 m AV Peak Grad:      7.0 mmHg AV Mean Grad:      4.2 mmHg LVOT Vmax:         77.40 cm/s LVOT Vmean:        54.600 cm/s LVOT VTI:          0.187 m LVOT/AV VTI  ratio: 0.72  AORTA Ao Root diam: 2.90 cm MITRAL VALVE                TRICUSPID VALVE MV Area (PHT): 8.62 cm     TR Peak grad:   28.3 mmHg MV Area VTI:   2.27 cm     TR Vmax:        266.00 cm/s MV Peak grad:  10.4 mmHg MV Mean grad:  4.0 mmHg     SHUNTS MV Vmax:       1.61 m/s     Systemic VTI:  0.19 m MV Vmean:      93.8 cm/s    Systemic Diam: 2.00 cm MV Decel Time: 88 msec MV E velocity: 69.20 cm/s MV A velocity: 123.00 cm/s MV E/A ratio:  0.56 Julien Nordmann MD Electronically signed by Julien Nordmann MD Signature Date/Time: 04/18/2023/11:16:32 AM    Final    MR BRAIN WO CONTRAST  Result Date: 04/18/2023 CLINICAL DATA:  Initial evaluation for acute TIA. EXAM: MRI HEAD WITHOUT  CONTRAST TECHNIQUE: Multiplanar, multiecho pulse sequences of the brain and surrounding structures were obtained without intravenous contrast. COMPARISON:  Prior CT from 04/17/2023. FINDINGS: Brain: Diffuse prominence of the CSF containing spaces compatible generalized cerebral atrophy. Patchy and confluent T2/FLAIR hyperintensity involving the periventricular and deep white matter both cerebral hemispheres as well as the pons, most likely related to chronic microvascular ischemic disease, advanced in nature. Small remote bilateral cerebellar infarcts, left greater than right. There is question of a punctate focus of restricted diffusion positioned along the midline of the splenium, seen only on coronal DWI sequence (series 18, image 24). Additionally, there is a questionable punctate focus of diffusion signal abnormality within the right cerebellar hemisphere as well (series 9, image 62). While these findings could be artifactual in nature, possible tiny acute ischemic infarcts are difficult to exclude, and could be considered in correct clinical setting. No associated hemorrhage. No other evidence for acute or subacute ischemia. Gray-white matter differentiation otherwise maintained. No acute intracranial hemorrhage. Small focus of chronic hemosiderin staining noted at the high anterior right frontal lobe (series 14, image 50), suggesting prior hemorrhage at this location. No mass lesion, midline shift or mass effect. No hydrocephalus or extra-axial fluid collection. Pituitary gland suprasellar region within normal limits. Vascular: Major intracranial vascular flow voids are maintained. Skull and upper cervical spine: Craniocervical junctional limits. Bone marrow signal intensity heterogeneous but overall within normal limits. No scalp soft tissue abnormality. Sinuses/Orbits: Globes and orbital soft tissues within normal limits. Scattered postop thickening present about the ethmoidal air cells. Paranasal sinuses  are otherwise clear. No mastoid effusion. Other: None. IMPRESSION: 1. Question punctate foci of restricted diffusion involving the mid splenium and within the right cerebellar hemisphere as above. While these findings could be artifactual in nature, possible tiny acute ischemic infarcts are difficult to exclude, and could be considered in correct clinical setting. No associated hemorrhage. 2. 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 Electronically Signed   By: Rise Mu M.D.   On: 04/18/2023 02:09   CT HEAD WO CONTRAST  Result Date: 04/17/2023 CLINICAL DATA:  A shin for acute blurry vision, speech difficulty, left lower extremity weakness. EXAM: CT HEAD WITHOUT CONTRAST TECHNIQUE: Contiguous axial images were obtained from the base of the skull through the vertex without intravenous contrast. RADIATION DOSE REDUCTION: This exam was performed according to the departmental dose-optimization program which includes  automated exposure control, adjustment of the mA and/or kV according to patient size and/or use of iterative reconstruction technique. COMPARISON:  Prior study from 07/13/2020. FINDINGS: Brain: Moderately advanced cerebral atrophy. 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 Remote left cerebellar infarct noted. No acute intracranial hemorrhage. No acute large vessel territory infarct. No mass lesion or midline shift. No hydrocephalus or extra-axial fluid collection. Vascular: No abnormal hyperdense vessel. Calcified atherosclerosis present at the skull base. Skull: Scalp soft tissues demonstrate no acute finding. Calvarium intact. Sinuses/Orbits: Globes orbital soft tissues within normal limits. Mild mucosal thickening present about the left ethmoidal air cells.  Visualized paranasal sinuses are otherwise clear. No mastoid effusion. Other: None. IMPRESSION: 1. No acute intracranial abnormality. 2. Moderately advanced cerebral atrophy with advanced chronic microvascular ischemic disease. 3. Remote left cerebellar infarct. Electronically Signed   By: Rise Mu M.D.   On: 04/17/2023 20:01      Assessment/Plan 1.  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  This would appear to represent a critical and life-threatening situation with a high risk of major stroke and poor neurologic outcome.  She was appropriately started on Plavix.  I had a long discussion with the patient and her family today regarding the situation.  Her more distal carotid artery is a little difficult to evaluate on the CT scan, but I would believe that her innominate lesion is likely the culprit lesion.  She does have some intracranial disease but no intervention would be required for this.  I believe she should have an angiogram of her great vessels tomorrow.  If high-grade innominate artery stenosis is indeed identified and is amenable to endovascular treatment, we can perform this concomitantly tomorrow.  I have discussed the procedure including the risks and benefits of the procedure in detail with the patient and her family.  I have discussed that with a high-grade lesion such as this that is symptomatic, if left untreated and treated medically the risk of stroke or recurrent neurologic events is very high.  They voiced their understanding and desire to proceed.  Please continue aspirin and Plavix as well as a statin agent both preoperatively and following the procedure. 2.  Essential hypertension.  Stable on outpatient medications and permissive parameters in the current setting due to her neurologic situation. 3.  Chronic kidney disease stage IIIb.  Hydrate and we will try to limit contrast is much as possible tomorrow. 4.  Dyslipidemia.   Continue statin agent. 5.  COPD.  Has albuterol as needed.  Would be a suboptimal surgical candidate for general anesthesia and we will try to manage her disease with endovascular therapy is much as possible.   [name], MD  04/19/2023 4:06 PM    This note was created with Dragon medical transcription system.  Any error is purely unintentional

## 2023-04-20 ENCOUNTER — Encounter
Admission: EM | Disposition: A | Discharge status: Home Health | Payer: Self-pay | Source: Home / Self Care | Attending: Osteopathic Medicine

## 2023-04-20 DIAGNOSIS — Z8673 Personal history of transient ischemic attack (TIA), and cerebral infarction without residual deficits: Secondary | ICD-10-CM | POA: Diagnosis not present

## 2023-04-20 DIAGNOSIS — Q2549 Other congenital malformations of aorta: Secondary | ICD-10-CM

## 2023-04-20 DIAGNOSIS — I6521 Occlusion and stenosis of right carotid artery: Secondary | ICD-10-CM | POA: Diagnosis not present

## 2023-04-20 DIAGNOSIS — I708 Atherosclerosis of other arteries: Secondary | ICD-10-CM | POA: Diagnosis not present

## 2023-04-20 DIAGNOSIS — I6523 Occlusion and stenosis of bilateral carotid arteries: Secondary | ICD-10-CM | POA: Diagnosis not present

## 2023-04-20 DIAGNOSIS — I70201 Unspecified atherosclerosis of native arteries of extremities, right leg: Secondary | ICD-10-CM

## 2023-04-20 HISTORY — PX: CAROTID PTA/STENT INTERVENTION: CATH118231

## 2023-04-20 LAB — CBC
HCT: 35.7 % — ABNORMAL LOW (ref 36.0–46.0)
Hemoglobin: 12 g/dL (ref 12.0–15.0)
MCH: 32.9 pg (ref 26.0–34.0)
MCHC: 33.6 g/dL (ref 30.0–36.0)
MCV: 97.8 fL (ref 80.0–100.0)
Platelets: 200 10*3/uL (ref 150–400)
RBC: 3.65 MIL/uL — ABNORMAL LOW (ref 3.87–5.11)
RDW: 13.6 % (ref 11.5–15.5)
WBC: 5.9 10*3/uL (ref 4.0–10.5)
nRBC: 0 % (ref 0.0–0.2)

## 2023-04-20 LAB — BASIC METABOLIC PANEL
Anion gap: 10 (ref 5–15)
BUN: 27 mg/dL — ABNORMAL HIGH (ref 8–23)
CO2: 26 mmol/L (ref 22–32)
Calcium: 9.5 mg/dL (ref 8.9–10.3)
Chloride: 106 mmol/L (ref 98–111)
Creatinine, Ser: 1.77 mg/dL — ABNORMAL HIGH (ref 0.44–1.00)
GFR, Estimated: 28 mL/min — ABNORMAL LOW (ref >60)
Glucose, Bld: 108 mg/dL — ABNORMAL HIGH (ref 70–99)
Potassium: 4.5 mmol/L (ref 3.5–5.1)
Sodium: 142 mmol/L (ref 135–145)

## 2023-04-20 LAB — POCT ACTIVATED CLOTTING TIME: Activated Clotting Time: 245 s

## 2023-04-20 LAB — GLUCOSE, CAPILLARY: Glucose-Capillary: 110 mg/dL — ABNORMAL HIGH (ref 70–99)

## 2023-04-20 LAB — MRSA NEXT GEN BY PCR, NASAL: MRSA by PCR Next Gen: NOT DETECTED

## 2023-04-20 SURGERY — CAROTID PTA/STENT INTERVENTION
Anesthesia: Moderate Sedation | Laterality: Right

## 2023-04-20 MED ORDER — MIDAZOLAM HCL 2 MG/ML PO SYRP: 8.0000 mg | ORAL_SOLUTION | Freq: Once | ORAL | Status: DC | PRN

## 2023-04-20 MED ORDER — FAMOTIDINE 20 MG PO TABS: 40.0000 mg | ORAL_TABLET | Freq: Once | ORAL | Status: DC | PRN

## 2023-04-20 MED ORDER — SODIUM CHLORIDE 0.9 % IV SOLN: INTRAVENOUS | Status: DC

## 2023-04-20 MED ORDER — VANCOMYCIN HCL IN DEXTROSE 1-5 GM/200ML-% IV SOLN
1000.0000 mg | Freq: Two times a day (BID) | INTRAVENOUS | Status: AC
  Administered 2023-04-20 – 2023-04-21 (×2): 1000 mg via INTRAVENOUS
  Filled 2023-04-20 (×2): qty 200

## 2023-04-20 MED ORDER — MIDAZOLAM HCL 2 MG/2ML IJ SOLN
INTRAMUSCULAR | Status: AC
  Filled 2023-04-20: qty 2

## 2023-04-20 MED ORDER — DOPAMINE-DEXTROSE 3.2-5 MG/ML-% IV SOLN
INTRAVENOUS | Status: AC
Start: 2023-04-20 — End: ?
  Filled 2023-04-20: qty 250

## 2023-04-20 MED ORDER — MAGNESIUM SULFATE 2 GM/50ML IV SOLN: 2.0000 g | Freq: Every day | INTRAVENOUS | Status: DC | PRN

## 2023-04-20 MED ORDER — SODIUM CHLORIDE 0.9 % IV SOLN: 500.0000 mL | Freq: Once | INTRAVENOUS | Status: DC | PRN

## 2023-04-20 MED ORDER — METOPROLOL TARTRATE 5 MG/5ML IV SOLN: 2.0000 mg | INTRAVENOUS | Status: DC | PRN

## 2023-04-20 MED ORDER — OXYCODONE-ACETAMINOPHEN 5-325 MG PO TABS
ORAL_TABLET | ORAL | Status: AC
  Administered 2023-04-20: 2 via ORAL
  Filled 2023-04-20: qty 2

## 2023-04-20 MED ORDER — FAMOTIDINE IN NACL 20-0.9 MG/50ML-% IV SOLN
20.0000 mg | Freq: Two times a day (BID) | INTRAVENOUS | Status: DC
Start: 2023-04-20 — End: 2023-04-22
  Administered 2023-04-20 – 2023-04-21 (×3): 20 mg via INTRAVENOUS
  Filled 2023-04-20 (×4): qty 50

## 2023-04-20 MED ORDER — FENTANYL CITRATE PF 50 MCG/ML IJ SOSY: 12.5000 ug | PREFILLED_SYRINGE | Freq: Once | INTRAMUSCULAR | Status: DC | PRN

## 2023-04-20 MED ORDER — OXYCODONE-ACETAMINOPHEN 5-325 MG PO TABS
1.0000 | ORAL_TABLET | ORAL | Status: DC | PRN
  Administered 2023-04-21: 1 via ORAL
  Filled 2023-04-20: qty 1

## 2023-04-20 MED ORDER — HEPARIN (PORCINE) IN NACL 2000-0.9 UNIT/L-% IV SOLN
INTRAVENOUS | Status: DC | PRN
  Administered 2023-04-20: 1000 mL

## 2023-04-20 MED ORDER — ATROPINE SULFATE 1 MG/10ML IJ SOSY
PREFILLED_SYRINGE | INTRAMUSCULAR | Status: AC
Start: 2023-04-20 — End: ?
  Filled 2023-04-20: qty 10

## 2023-04-20 MED ORDER — LABETALOL HCL 5 MG/ML IV SOLN: 10.0000 mg | INTRAVENOUS | Status: DC | PRN

## 2023-04-20 MED ORDER — CHLORHEXIDINE GLUCONATE CLOTH 2 % EX PADS
6.0000 | MEDICATED_PAD | Freq: Every day | CUTANEOUS | Status: DC
  Administered 2023-04-20 – 2023-04-23 (×4): 6 via TOPICAL

## 2023-04-20 MED ORDER — PHENYLEPHRINE 80 MCG/ML (10ML) SYRINGE FOR IV PUSH (FOR BLOOD PRESSURE SUPPORT)
PREFILLED_SYRINGE | INTRAVENOUS | Status: AC
  Filled 2023-04-20: qty 10

## 2023-04-20 MED ORDER — VANCOMYCIN HCL IN DEXTROSE 1-5 GM/200ML-% IV SOLN
INTRAVENOUS | Status: AC
Start: 2023-04-20 — End: ?
  Filled 2023-04-20: qty 200

## 2023-04-20 MED ORDER — PHENYLEPHRINE HCL-NACL 20-0.9 MG/250ML-% IV SOLN
INTRAVENOUS | Status: AC
Start: 2023-04-20 — End: ?
  Filled 2023-04-20: qty 250

## 2023-04-20 MED ORDER — MIDAZOLAM HCL 2 MG/2ML IJ SOLN
INTRAMUSCULAR | Status: DC | PRN
  Administered 2023-04-20 (×2): 1 mg via INTRAVENOUS

## 2023-04-20 MED ORDER — HEPARIN SODIUM (PORCINE) 1000 UNIT/ML IJ SOLN
INTRAMUSCULAR | Status: AC
Start: 2023-04-20 — End: ?
  Filled 2023-04-20: qty 10

## 2023-04-20 MED ORDER — METHYLPREDNISOLONE SODIUM SUCC 125 MG IJ SOLR: 125.0000 mg | Freq: Once | INTRAMUSCULAR | Status: DC | PRN

## 2023-04-20 MED ORDER — HEPARIN SODIUM (PORCINE) 1000 UNIT/ML IJ SOLN
INTRAMUSCULAR | Status: DC | PRN
  Administered 2023-04-20: 6000 [IU] via INTRAVENOUS
  Administered 2023-04-20: 3000 [IU] via INTRAVENOUS

## 2023-04-20 MED ORDER — FENTANYL CITRATE PF 50 MCG/ML IJ SOSY
PREFILLED_SYRINGE | INTRAMUSCULAR | Status: AC
  Filled 2023-04-20: qty 1

## 2023-04-20 MED ORDER — ACETAMINOPHEN 325 MG RE SUPP: 325.0000 mg | RECTAL | Status: DC | PRN

## 2023-04-20 MED ORDER — HYDRALAZINE HCL 20 MG/ML IJ SOLN: 5.0000 mg | INTRAMUSCULAR | Status: DC | PRN

## 2023-04-20 MED ORDER — FENTANYL CITRATE (PF) 100 MCG/2ML IJ SOLN
INTRAMUSCULAR | Status: DC | PRN
  Administered 2023-04-20: 50 ug via INTRAVENOUS
  Administered 2023-04-20 (×2): 25 ug via INTRAVENOUS

## 2023-04-20 MED ORDER — PHENOL 1.4 % MT LIQD: 1.0000 | OROMUCOSAL | Status: DC | PRN

## 2023-04-20 MED ORDER — ACETAMINOPHEN 325 MG PO TABS
325.0000 mg | ORAL_TABLET | ORAL | Status: DC | PRN
  Administered 2023-04-22: 325 mg via ORAL
  Filled 2023-04-20: qty 1

## 2023-04-20 MED ORDER — SODIUM CHLORIDE 0.9 % IV SOLN
INTRAVENOUS | Status: DC
Start: 2023-04-20 — End: 2023-04-21

## 2023-04-20 MED ORDER — VANCOMYCIN HCL IN DEXTROSE 1-5 GM/200ML-% IV SOLN
1000.0000 mg | INTRAVENOUS | Status: AC
  Administered 2023-04-20: 1000 mg via INTRAVENOUS
  Filled 2023-04-20: qty 200

## 2023-04-20 MED ORDER — ALPRAZOLAM 0.5 MG PO TABS
0.5000 mg | ORAL_TABLET | Freq: Once | ORAL | Status: AC
  Administered 2023-04-21: 0.5 mg via ORAL
  Filled 2023-04-20: qty 1

## 2023-04-20 MED ORDER — ONDANSETRON HCL 4 MG/2ML IJ SOLN
INTRAMUSCULAR | Status: AC
  Filled 2023-04-20: qty 2

## 2023-04-20 MED ORDER — ALUM & MAG HYDROXIDE-SIMETH 200-200-20 MG/5ML PO SUSP: 15.0000 mL | ORAL | Status: DC | PRN

## 2023-04-20 MED ORDER — DIPHENHYDRAMINE HCL 50 MG/ML IJ SOLN: 50.0000 mg | Freq: Once | INTRAMUSCULAR | Status: DC | PRN

## 2023-04-20 MED ORDER — IODIXANOL 320 MG/ML IV SOLN
INTRAVENOUS | Status: DC | PRN
  Administered 2023-04-20: 100 mL via INTRA_ARTERIAL

## 2023-04-20 MED ORDER — POTASSIUM CHLORIDE CRYS ER 20 MEQ PO TBCR: 20.0000 meq | EXTENDED_RELEASE_TABLET | Freq: Every day | ORAL | Status: DC | PRN

## 2023-04-20 MED ORDER — GUAIFENESIN-DM 100-10 MG/5ML PO SYRP: 15.0000 mL | ORAL_SOLUTION | ORAL | Status: DC | PRN

## 2023-04-20 MED ORDER — LIDOCAINE-EPINEPHRINE (PF) 1 %-1:200000 IJ SOLN
INTRAMUSCULAR | Status: DC | PRN
  Administered 2023-04-20: 10 mL

## 2023-04-20 SURGICAL SUPPLY — 36 items
BALLN LUTONIX 018 4X40X130 (BALLOONS) ×1
BALLN LUTONIX DCB 6X40X130 (BALLOONS) ×1
BALLN LUTONIX DCB 6X80X130 (BALLOONS) ×1
BALLN ULTRVRSE 8X60X75C (BALLOONS) ×1
BALLOON LUTONIX 018 4X40X130 (BALLOONS) IMPLANT
BALLOON LUTONIX DCB 6X40X130 (BALLOONS) IMPLANT
BALLOON LUTONIX DCB 6X80X130 (BALLOONS) IMPLANT
BALLOON ULTRVRSE 8X60X75C (BALLOONS) IMPLANT
CATH ANGIO 5F PIGTAIL 100CM (CATHETERS) IMPLANT
CATH ANGIO 5F PIGTAIL 65CM (CATHETERS) IMPLANT
CATH ANGIO 5F SIM1 100CM (CATHETERS) IMPLANT
CATH BEACON 5 .035 100 H1 TIP (CATHETERS) IMPLANT
CATH BEACON 5 .035 100 JB2 TIP (CATHETERS) IMPLANT
CATH G 5FX100 (CATHETERS) IMPLANT
CATH INFINITI JR4 5F (CATHETERS) IMPLANT
CATH VTK 5FR 125CM BEACON TIP (CATHETERS) IMPLANT
COVER DRAPE FLUORO 36X44 (DRAPES) IMPLANT
COVER PROBE ULTRASOUND 5X96 (MISCELLANEOUS) IMPLANT
DEVICE EMBOSHIELD NAV6 4.0-7.0 (FILTER) IMPLANT
DEVICE PRESTO INFLATION (MISCELLANEOUS) IMPLANT
DEVICE STARCLOSE SE CLOSURE (Vascular Products) IMPLANT
DEVICE TORQUE (MISCELLANEOUS) IMPLANT
GAUZE SPONGE 4X4 12PLY STRL (GAUZE/BANDAGES/DRESSINGS) IMPLANT
GLIDEWIRE ADV .035X260CM (WIRE) IMPLANT
GLIDEWIRE ANGLED SS 035X260CM (WIRE) IMPLANT
GUIDEWIRE PFTE-COATED .018X300 (WIRE) IMPLANT
IV NS 1000ML (IV SOLUTION) ×2
IV NS 1000ML BAXH (IV SOLUTION) IMPLANT
KIT CAROTID MANIFOLD (MISCELLANEOUS) IMPLANT
PACK ANGIOGRAPHY (CUSTOM PROCEDURE TRAY) ×1 IMPLANT
SHEATH BRITE TIP 6FRX11 (SHEATH) IMPLANT
SHEATH DESTINIATION 65 8FR (SHEATH) IMPLANT
STENT LIFESTREAM 12X38X80 (Permanent Stent) IMPLANT
SYR MEDRAD MARK 7 150ML (SYRINGE) IMPLANT
TUBING CONTRAST HIGH PRESS 72 (TUBING) IMPLANT
WIRE GUIDERIGHT .035X150 (WIRE) IMPLANT

## 2023-04-20 NOTE — Progress Notes (Signed)
OT Cancellation Note  Patient Details Name: Christine Burnett MRN: [id number] DOB: 02/20/1941   Cancelled Treatment:    Reason Eval/Treat Not Completed: Patient at procedure or test/ unavailable. Pt off the floor to undergo CAROTID PTA/STENT INTERVENTION (Right). Will attempt at later date/time as appropriate.  Constance Goltz 04/20/2023, 12:46 PM

## 2023-04-20 NOTE — Progress Notes (Signed)
PROGRESS NOTE    Christine Burnett   ZOX:[id number] DOB: 1941/02/20  DOA: 04/17/2023 Date of Service: 04/20/23 which is hospital day 1  PCP: Danelle Berry, PA-C    HPI: Christine Burnett is a 82 y.o. 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 However her symptoms gradually improved and she was back at her baseline. Later that day she was at a store and noticed uncontrollable twitching of her left leg which started in her groin and then progressed down the leg, lasting a few minutes and then resolving. Postevent she had weakness in that leg which gradually resolved again. She came into the ED for further evaluation.   Hospital course / significant events:  11/12: admitted to hospitalist service for TIA w/u. MRI brain concerning for possible tiny acute ischemic infarcts and chronic hemorrhage in the anterior right frontal lobe, fairly cortical. Pending Echo and carotid US.  11/13: neurology consult - Per neurology, punctate possible strokes on MRI do not fit the clinical history provided, therefore favor these to be artifact and proceeding w/ EEG, MRA H/N. Echo no evidence for LA enlargement no need for Zio monitor. Seizure precautions, initiating lamotrigine and d/c BuSpar.  11/14: substantial atherosclerotic disease on CTA H/N. The most clinically significant is poor opacification of the right internal carotid artery, and given this finding neurology team is highly concerned that her left leg weakness spells were limb shaking TIA. 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  Vascular sugery applied stent today   See below re: other TIA w/u and neuro recs   TIA, possible seizure-like activity but more concern for limb-shaking TIA Echo no LA enlargement, Afib less likely  EEG nonfocal CTA H/N see below - concern for R ICA occlusion D/c BuSpar Continue on Lamotrigine  Seizure precautions  Statin ASA decide whether to continue Plavix for 21 to 90-day course per neurology Vascular surgery address ICA as above   AKI on CKD3b Caution w/ contrast studies but need CTA H/N for diagnostic/prognostic purposes Follow BMP in AM   Chest pain - resolved Repeat troponin / EKG as needed   Essential hypertension Holding antihypertensives for now    Dyslipidemia Statin Lipid panel   Anxiety and depression D/c BuSpar and starting Lamotrigine per neurology  Continue Paxil   Chronic obstructive pulmonary disease (COPD) (HCC) Albuterol prn     Normal weight based on BMI: Body mass index is 22.06 kg/m.  Underweight - under 18.5  normal weight - 18.5 to 24.9 overweight - 25 to 29.9 obese - 30 or more   DVT prophylaxis: ambulation IV fluids: no continuous IV fluids  Nutrition: regular Central lines / invasive devices: none  Code Status: FULL CODE ACP documentation reviewed: 04/18/23 and none on file in [facility]  TOC needs: none at this time Barriers to dispo / significant pending items: EEG, CTA, monitor renal  fxn, may need further consultation/intervention pending results              Subjective / Brief ROS:  Patient reports doing well - examined in specials recovery following angio  No new weakness  Denies CP/SOB.  Pain controlled.    Family Communication:family at bedside       Objective Findings:  Vitals:   04/20/23 1545 04/20/23 1600 04/20/23 1615 04/20/23 1630  BP: 130/86 120/79 102/79 126/72  Pulse: 93 92 85 92  Resp: 17 17 17 12   Temp:      TempSrc:      SpO2: 92% 91% 94% 93%  Weight:      Height:        Intake/Output Summary (Last 24 hours) at 04/20/2023 1634 Last data filed at 04/19/2023 2121 Gross per 24 hour  Intake 320 ml  Output --  Net 320 ml   Filed Weights   04/18/23 0039 04/20/23 0900  Weight: 62 kg 62 kg    Examination:  Physical Exam Constitutional:      General: She is not in acute distress. Cardiovascular:     Rate and Rhythm: Normal rate and regular rhythm.  Pulmonary:     Effort: Pulmonary effort is normal.     Breath sounds: Normal breath sounds. Wheezes: scattered, mild.  Musculoskeletal:     Right lower leg: No edema.     Left lower leg: No edema.  Neurological:     General: No focal deficit present.     Mental Status: She is alert and oriented to person, place, and time. Mental status is at baseline.  Psychiatric:        Mood and Affect: Mood normal.        Behavior: Behavior normal.          Scheduled Medications:   [MAR Hold] ALPRAZolam  0.5 mg Oral Once   [MAR Hold] aspirin EC  81 mg Oral QHS   [MAR Hold] atorvastatin  40 mg Oral QHS   [MAR Hold] calcium carbonate  300 mg of elemental calcium Oral Q MTWThF   [MAR Hold] cholecalciferol  2,000 Units Oral QHS   [MAR Hold] enoxaparin (LOVENOX) injection  30 mg Subcutaneous Q24H   [MAR Hold] lamoTRIgine  25 mg Oral QHS   [MAR Hold] melatonin  2.5 mg Oral QHS   [MAR Hold] multivitamin with minerals  1 tablet Oral Daily   [MAR Hold] PARoxetine  40 mg Oral q morning   [MAR Hold] ticagrelor  90 mg Oral BID    Continuous Infusions:  sodium chloride 100 mL/hr at 04/20/23 1253   sodium chloride     famotidine (PEPCID) IV     magnesium sulfate bolus IVPB     vancomycin      PRN Medications:  sodium chloride, [MAR Hold] acetaminophen **OR**  [MAR Hold] acetaminophen (TYLENOL) oral liquid 160 mg/5 mL **OR** [MAR Hold] acetaminophen, acetaminophen **OR** acetaminophen, [MAR Hold] albuterol, alum & mag hydroxide-simeth, guaiFENesin-dextromethorphan, Heparin (Porcine) in NaCl, hydrALAZINE, iodixanol, labetalol, lidocaine-EPINEPHrine (PF), magnesium sulfate bolus IVPB, metoprolol tartrate, [MAR Hold] ondansetron (ZOFRAN) IV, oxyCODONE-acetaminophen, phenol, potassium chloride, [MAR Hold] senna-docusate, [MAR Hold] traZODone  Antimicrobials from admission:  Anti-infectives (From admission, onward)    Start     Dose/Rate Route Frequency Ordered Stop   04/20/23 2200  vancomycin (VANCOCIN) IVPB 1000 mg/200 mL premix        1,000 mg 200 mL/hr over 60 Minutes Intravenous Every 12 hours 04/20/23 1152 04/21/23 2159   04/20/23 0835  vancomycin (VANCOCIN) IVPB 1000 mg/200 mL premix        1,000 mg 200 mL/hr over 60 Minutes Intravenous 60 min pre-op 04/20/23 0835 04/20/23 1026           Data Reviewed:  I have personally reviewed the following...  CBC: Recent Labs  Lab 04/17/23 1843 04/20/23 0438  WBC 5.5 5.9  HGB 12.1 12.0  HCT 36.5 35.7*  MCV 101.7* 97.8  PLT 199 200   Basic Metabolic Panel: Recent Labs  Lab 04/17/23 1843 04/19/23 0838 04/20/23 0438  NA 136 136 142  K 4.0 4.3 4.5  CL 103 102 106  CO2 24 26 26   GLUCOSE 101* 106* 108*  BUN 27* 28* 27*  CREATININE 1.53* 1.80* 1.77*  CALCIUM 9.2 9.4 9.5   GFR: Estimated Creatinine Clearance: 22.5 mL/min (A) (by C-G formula based on SCr of 1.77 mg/dL (H)). Liver Function Tests: No results for input(s): "AST", "ALT", "ALKPHOS", "BILITOT", "PROT", "ALBUMIN" in the last 168 hours. No results for input(s): "LIPASE", "AMYLASE" in the last 168 hours. No results for input(s): "AMMONIA" in the last 168 hours. Coagulation Profile: No results for input(s): "INR", "PROTIME" in the last 168 hours. Cardiac Enzymes: No results for input(s): "CKTOTAL", "CKMB", "CKMBINDEX",  "TROPONINI" in the last 168 hours. BNP (last 3 results) No results for input(s): "PROBNP" in the last 8760 hours. HbA1C: Recent Labs    04/17/23 1843  HGBA1C 5.4   CBG: No results for input(s): "GLUCAP" in the last 168 hours. Lipid Profile: Recent Labs    04/18/23 0454  CHOL 103  HDL 42  LDLCALC 42  TRIG 95  CHOLHDL 2.5   Thyroid Function Tests: No results for input(s): "TSH", "T4TOTAL", "FREET4", "T3FREE", "THYROIDAB" in the last 72 hours. Anemia Panel: No results for input(s): "VITAMINB12", "FOLATE", "FERRITIN", "TIBC", "IRON", "RETICCTPCT" in the last 72 hours. Most Recent Urinalysis On File:     Component Value Date/Time   COLORURINE YELLOW (A) 04/17/2023 2133   APPEARANCEUR CLEAR (A) 04/17/2023 2133   APPEARANCEUR Clear 01/28/2016 0841   LABSPEC 1.013 04/17/2023 2133   PHURINE 6.0 04/17/2023 2133   GLUCOSEU NEGATIVE 04/17/2023 2133   HGBUR NEGATIVE 04/17/2023 2133   BILIRUBINUR NEGATIVE 04/17/2023 2133   BILIRUBINUR neg 07/29/2019 1541   BILIRUBINUR Negative 01/28/2016 0841   KETONESUR NEGATIVE 04/17/2023 2133   PROTEINUR NEGATIVE 04/17/2023 2133   UROBILINOGEN 0.2 07/29/2019 1541   NITRITE NEGATIVE 04/17/2023 2133   LEUKOCYTESUR NEGATIVE 04/17/2023 2133   Sepsis Labs: @LABRCNTIP (procalcitonin:4,lacticidven:4) Microbiology: No results found for this or any previous visit (from the past 240 hour(s)).    Radiology Studies last 3 days: PERIPHERAL VASCULAR CATHETERIZATION  Result Date: 04/20/2023 See surgical note for result.  EEG adult  Result Date: 04/18/2023 Charlsie Quest, MD     04/18/2023  8:52 PM Patient Name: Christine Burnett MRN: [id number] Epilepsy Attending: Charlsie Quest Referring Physician/Provider: Gordy Councilman, MD Date: 04/18/2023 Duration: 26.06 mins Patient history: 82 yo F with rhythmic shaking of the left lower extremity with classic description of a jacksonian march. EEG to evaluate for seizure Level of alertness: Awake,  asleep AEDs during EEG study: None Technical aspects: This EEG study was done with scalp electrodes positioned according to the 10-20 International system of electrode placement. Electrical activity was reviewed with band pass filter of 1-70Hz , sensitivity of 7 uV/mm, display speed of 56mm/sec with a 60Hz  notched filter applied as appropriate. EEG data were recorded continuously and digitally stored.  Video monitoring was available and  reviewed as appropriate. Description: The posterior dominant rhythm consists of 8 Hz activity of moderate voltage (25-35 uV) seen predominantly in posterior head regions, symmetric and reactive to eye opening and eye closing. Sleep was characterized by vertex waves, sleep spindles (12 to 14 Hz), maximal frontocentral region. EEG showed intermittent generalized 3 to 6 Hz theta-delta slowing. Hyperventilation and photic stimulation were not performed.   ABNORMALITY - Intermittent slow, generalized IMPRESSION: This study is suggestive of mild diffuse encephalopathy. No seizures or epileptiform discharges were seen throughout the recording. Charlsie Quest   CT ANGIO HEAD NECK W WO CM  Result Date: 04/18/2023 CLINICAL DATA:  Abnormality in the basilar artery on MRA, possible acute infarcts on MRI EXAM: CT ANGIOGRAPHY HEAD AND NECK WITH AND WITHOUT CONTRAST TECHNIQUE: Multidetector CT imaging of the head and neck was performed using the standard protocol during bolus administration of intravenous contrast. Multiplanar CT image reconstructions and MIPs were obtained to evaluate the vascular anatomy. Carotid stenosis measurements (when applicable) are obtained utilizing NASCET criteria, using the distal internal carotid diameter as the denominator. RADIATION DOSE REDUCTION: This exam was performed according to the departmental dose-optimization program which includes automated exposure control, adjustment of the mA and/or kV according to patient size and/or use of iterative  reconstruction technique. CONTRAST:  75mL OMNIPAQUE IOHEXOL 350 MG/ML SOLN COMPARISON:  07/03/2014 CTA and, correlation is made with 04/18/2023 MRI head and MRA head and 04/17/2023 CT head FINDINGS: CT HEAD FINDINGS Brain: The infarcts noted on the recent MRI are not apparent on CT. No evidence of new acute infarct, hemorrhage, mass, mass effect, or midline shift. No hydrocephalus or extra-axial fluid collection. Periventricular white matter changes, likely the sequela of chronic small vessel ischemic disease. Vascular: No hyperdense vessel. Atherosclerotic calcifications in the intracranial carotid and vertebral arteries. Skull: Negative for fracture or focal lesion. Sinuses/Orbits: Mucosal thickening in the ethmoid air cells. No acute finding in the orbits. Other: The mastoid air cells are well aerated. CTA NECK FINDINGS Aortic arch: Standard branching. Imaged portion shows no evidence of aneurysm or dissection. Evaluation of the arch vessel origins is limited by beam hardening artifact from the adjacent contrast bolus, but there is at least 50% stenosis at each of the vessel origins. Right carotid system: No evidence of dissection, occlusion, or hemodynamically significant stenosis (greater than 50%). Atherosclerotic disease at the bifurcation and in the proximal and distal ICA is not hemodynamically significant. Comparatively poor opacification of the right ICA compared to the left may be related to the degree of stenosis at the origin of the brachiocephalic artery. Left carotid system: No evidence of dissection, occlusion, or hemodynamically significant stenosis (greater than 50%). Atherosclerotic disease at the bifurcation and in the proximal and distal ICA is not hemodynamically significant. Vertebral arteries: Poor visualization of the origin of the right vertebral artery secondary to the adjacent contrast bolus. Within that limitation, there is likely severe stenosis at the origin of the right vertebral  artery, which is very poorly opacified until the V3 segment (series 10, image 27). Moderate stenosis at the origin of left vertebral artery, which is non dominant. The left vertebral artery is patent through the skull base. No evidence of dissection. Skeleton: No acute osseous abnormality. Degenerative changes in the cervical spine. Other neck: No acute finding. Upper chest: Emphysema. No focal pulmonary opacity or pleural effusion. Review of the MIP images confirms the above findings CTA HEAD FINDINGS Anterior circulation: Both internal carotid arteries are patent to the termini, with calcifications but without  significant stenosis, although the right ICA is poorly opacified. 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 A1 segments patent. Normal anterior communicating artery. Anterior cerebral arteries are patent to their distal aspects without significant stenosis. No M1 stenosis or occlusion. MCA branches perfused to their distal aspects without significant stenosis. Opacification of the right ACA and MCA is improved compared to the right ICA, likely reflecting collateral flow from the right posterior communicating artery. Posterior circulation: Moderate to severe stenosis in the proximal left V4 (series 10, images 155, 152, and 145). The left vertebral artery primarily supplies the left PICA, which is patent. Mild stenosis in the proximal right V4, which is otherwise patent to the vertebrobasilar junction. The right PICA is also patent. Given poor opacification of the extracranial V4, this is likely retrograde. Basilar patent to its distal aspect without significant stenosis. Superior cerebellar arteries patent proximally. Patent P1 segments. The bilateral posterior communicating arteries are patent. PCAs perfused to their distal aspects without significant  stenosis. Venous sinuses: As permitted by contrast timing, patent. Anatomic variants: None significant. No evidence of aneurysm or vascular malformation. Review of the MIP images confirms the above findings IMPRESSION: 1. The infarcts noted on the recent MRI are not apparent on CT. No evidence of new acute infarct. 2. No intracranial large vessel occlusion. 3. 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 4. Poor opacification of the right ICA, which may be related to stenosis at the origin of the brachiocephalic artery. Improved opacification of the right ACA and MCA is likely collateral flow from the posterior communicating artery. 5. Moderate stenosis at the origin of the left vertebral artery, with additional moderate to severe stenosis in the proximal left V4. 6. 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 7. Evaluation of the arch vessel origins is limited by beam hardening artifact from the adjacent contrast bolus, but there is likely at least 50% stenosis at each of the vessel origins. \ Electronically Signed   By: Wiliam Ke M.D.   On: 04/18/2023 19:41   US Carotid Bilateral  Result Date: 04/18/2023 CLINICAL DATA:  TIA Hyperlipidemia Former tobacco user EXAM: BILATERAL CAROTID DUPLEX ULTRASOUND TECHNIQUE: Wallace Cullens scale imaging, color Doppler and duplex ultrasound were performed of bilateral carotid and vertebral arteries in the neck. COMPARISON:  07/29/2018 FINDINGS: Criteria: Quantification of carotid stenosis is based on velocity parameters that correlate the residual internal carotid diameter with NASCET-based stenosis levels, using the diameter of the distal internal carotid lumen as the denominator for stenosis measurement. The following velocity measurements were  obtained: RIGHT ICA: 39/4 cm/sec CCA: 18/11 cm/sec SYSTOLIC ICA/CCA RATIO:  2.2 ECA: 50 cm/sec LEFT ICA: 242/36 cm/sec CCA: 77/15 cm/sec SYSTOLIC ICA/CCA RATIO:  3.1 ECA: 156 cm/sec RIGHT CAROTID ARTERY: Mild calcified plaque of the distal common carotid artery. Common carotid artery waveform is abnormal and monophasic. Internal carotid artery waveform is abnormal with reversal of systolic flow. RIGHT VERTEBRAL ARTERY:  Bidirectional flow. LEFT CAROTID ARTERY: Bulky calcified plaque noted at the distal left common and proximal internal carotid arteries. Common carotid artery waveform is abnormal and monophasic. LEFT VERTEBRAL ARTERY:  Antegrade flow. Right upper  extremity blood pressure is significantly lower than left with right side measuring 130/98 mm Hg and left measuring 164/82 mm Hg. IMPRESSION: 1. Monophasic waveform in the right common carotid artery and reversal of systolic flow in right internal carotid artery consistent with significant proximal stenosis. The site of significant origin of the right brachiocephalic artery asymmetrically decreased right upper extremity blood pressure as well as bidirectional flow in the right vertebral artery. 2. Monophasic waveform in the left common carotid artery consistent with significant stenosis at the origin of the common carotid artery. 3. 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 4. Further evaluation with CT angiography of the neck is recommended. Electronically Signed   By: Acquanetta Belling M.D.   On: 04/18/2023 15:32   MR ANGIO HEAD WO CONTRAST  Result Date: 04/18/2023 CLINICAL DATA:  Neuro deficit, acute, stroke suspected EXAM: MRA HEAD WITHOUT CONTRAST TECHNIQUE: Angiographic images of the Circle of Willis were acquired using MRA technique without intravenous contrast. COMPARISON:  Same day brain MRI FINDINGS: Anterior circulation: Asymmetrically decreased flow void in the right petrous, cavernous, and  supraclinoid ICA is worrisome for interval development of a new right ICA occlusion. Normal flow void was visualized on same day brain MRI. There is reconstitution of the level of the ICA terminus via collaterals from the circle-of-Willis. There is normal arborization in the bilateral MCA territories. Posterior circulation: There is nearly absent flow void in the basilar artery with robust PCOM bilaterally. P2 and P3 segments demonstrate normal flow signal. Anatomic variants: Fetal PCAs bilaterally. Other: None. IMPRESSION: 1. Asymmetrically decreased flow void in the right petrous, cavernous, and supraclinoid ICA is worrisome for interval development of a new right ICA occlusion. There is reconstitution of the level of the ICA terminus via collaterals from the circle-of-Willis. Recommend further evaluation with a CT head and neck angiogram 2. Nearly absent flow void in the basilar artery with robust PCOM bilaterally. 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 Recommend attention on CTA. These results will be called to the ordering clinician or representative by the Radiologist Assistant, and communication documented in the PACS or [employer]. Electronically Signed   By: Lorenza Cambridge M.D.   On: 04/18/2023 14:48   ECHOCARDIOGRAM COMPLETE BUBBLE STUDY  Result Date: 04/18/2023    ECHOCARDIOGRAM REPORT   Patient Name:   NALINI RICKEY Date of Exam: 04/18/2023 Medical Rec #:  [id number]          Height:       66.0 in Accession #:    [id number]         Weight:       136.7 lb Date of Birth:  12/08/1940          BSA:          1.701 m Patient Age:    82 years           BP:           149/74 mmHg Patient Gender: F                  HR:           93 bpm. Exam Location:  [facility] Procedure: 2D Echo, Cardiac Doppler, Color Doppler and Saline Contrast Bubble            Study Indications:     TIA 435.9 / G45.9  History:         Patient has prior history  of Echocardiogram examinations, most                   recent 01/26/2021. COPD, TIA and Stroke; Risk                  Factors:Dyslipidemia.  Sonographer:     Cristela Blue Referring Phys:  [id number] JAN A MANSY Diagnosing Phys: Julien Nordmann MD IMPRESSIONS  1. Left ventricular ejection fraction, by estimation, is 55 to 60%. The left ventricle has normal function. The left ventricle has no regional wall motion abnormalities. Left ventricular diastolic parameters are consistent with Grade I diastolic dysfunction (impaired relaxation).  2. Right ventricular systolic function is normal. The right ventricular size is normal. There is normal pulmonary artery systolic pressure. The estimated right ventricular systolic pressure is 33.3 mmHg.  3. The mitral valve is normal in structure. Moderate mitral valve regurgitation. No evidence of mitral stenosis.  4. Tricuspid valve regurgitation is moderate.  5. The aortic valve is normal in structure. Aortic valve regurgitation is mild. Aortic valve sclerosis/calcification is present, without any evidence of aortic stenosis.  6. The inferior vena cava is normal in size with greater than 50% respiratory variability, suggesting right atrial pressure of 3 mmHg.  7. Agitated saline contrast bubble study was negative, with no evidence of any interatrial shunt. FINDINGS  Left Ventricle: Left ventricular ejection fraction, by estimation, is 55 to 60%. The left ventricle has normal function. The left ventricle has no regional wall motion abnormalities. The left ventricular internal cavity size was normal in size. There is  no left ventricular hypertrophy. Left ventricular diastolic parameters are consistent with Grade I diastolic dysfunction (impaired relaxation). Right Ventricle: The right ventricular size is normal. No increase in right ventricular wall thickness. Right ventricular systolic function is normal. There is normal pulmonary artery systolic pressure. The tricuspid regurgitant velocity is 2.66 m/s, and  with an assumed right  atrial pressure of 5 mmHg, the estimated right ventricular systolic pressure is 33.3 mmHg. Left Atrium: Left atrial size was normal in size. Right Atrium: Right atrial size was normal in size. Pericardium: There is no evidence of pericardial effusion. Mitral Valve: The mitral valve is normal in structure. There is mild calcification of the mitral valve leaflet(s). Moderate mitral valve regurgitation. No evidence of mitral valve stenosis. MV peak gradient, 10.4 mmHg. The mean mitral valve gradient is 4.0 mmHg. Tricuspid Valve: The tricuspid valve is normal in structure. Tricuspid valve regurgitation is moderate . No evidence of tricuspid stenosis. Aortic Valve: The aortic valve is normal in structure. Aortic valve regurgitation is mild. Aortic valve sclerosis/calcification is present, without any evidence of aortic stenosis. Aortic valve mean gradient measures 4.2 mmHg. Aortic valve peak gradient measures 7.0 mmHg. Aortic valve area, by VTI measures 2.26 cm. Pulmonic Valve: The pulmonic valve was normal in structure. Pulmonic valve regurgitation is not visualized. No evidence of pulmonic stenosis. Aorta: The aortic root is normal in size and structure. Venous: The inferior vena cava is normal in size with greater than 50% respiratory variability, suggesting right atrial pressure of 3 mmHg. IAS/Shunts: No atrial level shunt detected by color flow Doppler. Agitated saline contrast was given intravenously to evaluate for intracardiac shunting. Agitated saline contrast bubble study was negative, with no evidence of any interatrial shunt. There  is no evidence of a patent foramen ovale. There is no evidence of an atrial septal defect.  LEFT VENTRICLE PLAX 2D LVIDd:         4.10 cm  Diastology LVIDs:         2.40 cm   LV e' medial:    5.11 cm/s LV PW:         1.00 cm   LV E/e' medial:  13.5 LV IVS:        1.00 cm   LV e' lateral:   5.98 cm/s LVOT diam:     2.00 cm   LV E/e' lateral: 11.6 LV SV:         59 LV SV Index:    35 LVOT Area:     3.14 cm  RIGHT VENTRICLE RV Basal diam:  2.20 cm RV Mid diam:    1.50 cm LEFT ATRIUM           Index        RIGHT ATRIUM          Index LA diam:      2.40 cm 1.41 cm/m   RA Area:     9.17 cm LA Vol (A2C): 18.4 ml 10.82 ml/m  RA Volume:   16.70 ml 9.82 ml/m LA Vol (A4C): 26.5 ml 15.58 ml/m  AORTIC VALVE AV Area (Vmax):    1.84 cm AV Area (Vmean):   1.76 cm AV Area (VTI):     2.26 cm AV Vmax:           132.50 cm/s AV Vmean:          97.350 cm/s AV VTI:            0.260 m AV Peak Grad:      7.0 mmHg AV Mean Grad:      4.2 mmHg LVOT Vmax:         77.40 cm/s LVOT Vmean:        54.600 cm/s LVOT VTI:          0.187 m LVOT/AV VTI ratio: 0.72  AORTA Ao Root diam: 2.90 cm MITRAL VALVE                TRICUSPID VALVE MV Area (PHT): 8.62 cm     TR Peak grad:   28.3 mmHg MV Area VTI:   2.27 cm     TR Vmax:        266.00 cm/s MV Peak grad:  10.4 mmHg MV Mean grad:  4.0 mmHg     SHUNTS MV Vmax:       1.61 m/s     Systemic VTI:  0.19 m MV Vmean:      93.8 cm/s    Systemic Diam: 2.00 cm MV Decel Time: 88 msec MV E velocity: 69.20 cm/s MV A velocity: 123.00 cm/s MV E/A ratio:  0.56 Julien Nordmann MD Electronically signed by Julien Nordmann MD Signature Date/Time: 04/18/2023/11:16:32 AM    Final    MR BRAIN WO CONTRAST  Result Date: 04/18/2023 CLINICAL DATA:  Initial evaluation for acute TIA. EXAM: MRI HEAD WITHOUT CONTRAST TECHNIQUE: Multiplanar, multiecho pulse sequences of the brain and surrounding structures were obtained without intravenous contrast. COMPARISON:  Prior CT from 04/17/2023. FINDINGS: Brain: Diffuse prominence of the CSF containing spaces compatible generalized cerebral atrophy. Patchy and confluent T2/FLAIR hyperintensity involving the periventricular and deep white matter both cerebral hemispheres as well as the pons, most likely related to chronic microvascular ischemic disease, advanced in nature. Small remote bilateral cerebellar infarcts, left greater than right. There is  question of a punctate focus of restricted diffusion positioned along the midline of the splenium, seen only on coronal DWI sequence (series  18, image 24). Additionally, there is a questionable punctate focus of diffusion signal abnormality within the right cerebellar hemisphere as well (series 9, image 62). While these findings could be artifactual in nature, possible tiny acute ischemic infarcts are difficult to exclude, and could be considered in correct clinical setting. No associated hemorrhage. No other evidence for acute or subacute ischemia. Gray-white matter differentiation otherwise maintained. No acute intracranial hemorrhage. Small focus of chronic hemosiderin staining noted at the high anterior right frontal lobe (series 14, image 50), suggesting prior hemorrhage at this location. No mass lesion, midline shift or mass effect. No hydrocephalus or extra-axial fluid collection. Pituitary gland suprasellar region within normal limits. Vascular: Major intracranial vascular flow voids are maintained. Skull and upper cervical spine: Craniocervical junctional limits. Bone marrow signal intensity heterogeneous but overall within normal limits. No scalp soft tissue abnormality. Sinuses/Orbits: Globes and orbital soft tissues within normal limits. Scattered postop thickening present about the ethmoidal air cells. Paranasal sinuses are otherwise clear. No mastoid effusion. Other: None. IMPRESSION: 1. Question punctate foci of restricted diffusion involving the mid splenium and within the right cerebellar hemisphere as above. While these findings could be artifactual in nature, possible tiny acute ischemic infarcts are difficult to exclude, and could be considered in correct clinical setting. No associated hemorrhage. 2. 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 Electronically Signed   By:  Rise Mu M.D.   On: 04/18/2023 02:09   CT HEAD WO CONTRAST  Result Date: 04/17/2023 CLINICAL DATA:  A shin for acute blurry vision, speech difficulty, left lower extremity weakness. EXAM: CT HEAD WITHOUT CONTRAST TECHNIQUE: Contiguous axial images were obtained from the base of the skull through the vertex without intravenous contrast. RADIATION DOSE REDUCTION: This exam was performed according to the departmental dose-optimization program which includes automated exposure control, adjustment of the mA and/or kV according to patient size and/or use of iterative reconstruction technique. COMPARISON:  Prior study from 07/13/2020. FINDINGS: Brain: Moderately advanced cerebral atrophy. 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 Remote left cerebellar infarct noted. No acute intracranial hemorrhage. No acute large vessel territory infarct. No mass lesion or midline shift. No hydrocephalus or extra-axial fluid collection. Vascular: No abnormal hyperdense vessel. Calcified atherosclerosis present at the skull base. Skull: Scalp soft tissues demonstrate no acute finding. Calvarium intact. Sinuses/Orbits: Globes orbital soft tissues within normal limits. Mild mucosal thickening present about the left ethmoidal air cells. Visualized paranasal sinuses are otherwise clear. No mastoid effusion. Other: None. IMPRESSION: 1. No acute intracranial abnormality. 2. Moderately advanced cerebral atrophy with advanced chronic microvascular ischemic disease. 3. Remote left cerebellar infarct. Electronically Signed   By: Rise Mu M.D.   On: 04/17/2023 20:01       Time spent: 50 min    Sunnie Nielsen, DO [facility] 04/20/2023, 4:34 PM    Dictation software may have been used to generate the above note. Typos may occur and escape review in typed/dictated notes. Please contact Dr  Lyn Hollingshead directly for clarity if needed.  Staff may message me via secure chat in Epic  but this may not receive an immediate response,  please page me for urgent matters!  If 7PM-7AM, please contact night coverage [email or web address]

## 2023-04-20 NOTE — Op Note (Signed)
OPERATIVE NOTE DATE: 04/20/2023  PROCEDURE:  Ultrasound guidance for vascular access right femoral artery Percutaneous transluminal angioplasty of the right iliac artery with 6 mm diameter by 8 cm length Lutonix drug-coated angioplasty balloon Thoracic aortogram Left cervical and cerebral carotid angiogram Right cervical and cerebral carotid angiogram Stent placement to the right innominate artery with the use of the Nav 6 embolic protection device with a 12 mm diameter by 38 mm length Lifestream stent.  PRE-OPERATIVE DIAGNOSIS: 1.  High-grade innominate artery stenosis 2.  Cervical carotid stenosis 3.  Recent TIA symptoms with left-sided weakness  POST-OPERATIVE DIAGNOSIS:  Same as above with 60 to 70% right common iliac artery stenosis and 70 to 80% right external iliac artery stenosis  SURGEON: [name], MD  ASSISTANT(S): None  ANESTHESIA: local/MCS  ESTIMATED BLOOD LOSS: 25 cc  CONTRAST: 100 cc  FLUORO TIME: 35.9 minutes  MODERATE CONSCIOUS SEDATION TIME:  Approximately 110 minutes using 2 mg of Versed and 100 mcg of Fentanyl  FINDING(S): 1.   Less than 50% bilateral cervical carotid artery stenosis 2.   Greater than 95% innominate artery stenosis 3.   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 female who presents with high-grade innominate artery stenosis and some degree of cervical carotid artery stenosis.  The patient has had 2 recent TIAs with some left-sided weakness that resolved in less than 24 hours.  She has a litany of medical issues including COPD and needs angiography for further evaluation of her great vessel stenosis and innominate artery stenting was felt to be preferred to open surgical therapy for that reason. I have completed Share Decision Making with Titus Mould prior to surgery.  Conversations included: -Discussion of all  treatment options including carotid endarterectomy (CEA), CAS (which includes transcarotid artery revascularization (TCAR)), and optimal medical therapy (OMT)). Given that this is likely innominate artery stenosis, open surgical therapy is suboptimal and optimal medical therapy has an extremely high risk of stroke given the symptomatic high-grade lesion. -Explanation of risks and benefits for each option specific to [facility] clinical situation. -Integration of clinical guidelines as it relates to the patient's history and co-morbidities -Discussion and incorporation of Titus Mould and their personal preferences and priorities in choosing a treatment plan.  If patient was unable to participate in Shared Decision Making this process was done with the patient as well as her family  Risks and benefits were discussed and informed consent was obtained.   DESCRIPTION: After obtaining full informed written consent, the patient was brought back to the vascular suite and placed supine upon the table.  The patient received IV antibiotics prior to induction. Moderate conscious sedation was administered during a face to face encounter with the patient throughout the procedure with my supervision of the RN administering medicines and monitoring the patients vital signs and mental status throughout from the start of the procedure until the patient was taken to the recovery room.  After obtaining adequate anesthesia, the patient was prepped and draped in the standard fashion.   The right femoral artery was visualized with ultrasound and found to be widely patent. It was then accessed under direct ultrasound guidance without difficulty with a Seldinger needle. A permanent image was recorded. A J-wire was placed and we then placed a 6 French sheath. The patient was then heparinized and a total of 9000  units of intravenous heparin were given and an ACT was checked to confirm successful anticoagulation. A  pigtail catheter was then placed into the ascending aorta.  There was significant iliac artery disease that was initially somewhat difficult to traverse with a J-wire and pigtail catheter.  Imaging through the right femoral sheath showed about a 70 to 80% stenosis in the right external iliac artery and another stenosis of about 60 to 70% in the right common iliac artery a few centimeters superior.  There was a large amount of stored energy and the catheters and so I elected to balloon angioplasty the right iliac system to improve this.  A 6 mm diameter by 8 cm length Lutonix drug-coated angioplasty balloon was inflated in the right external and common iliac arteries with less than 30% residual stenosis and improvement in the stored energy and the catheters.  The pigtail catheter was then placed back into the ascending aorta and an LAO projection arteriogram was performed.  This showed a very high-grade stenosis of the innominate artery just beyond its origin in the 95 to 98% range.  The left common carotid artery and left subclavian artery had moderate stenosis in the 60 to 70% range in both vessels.  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  I then selectively cannulated the left common carotid artery without difficulty with a JB2 catheter and advanced into the mid left common common carotid artery.  Cervical and cerebral carotid angiography was then performed. There were no obvious intracranial filling defects with entire cross-filling left to right and both the anterior and middle cerebral arteries. The carotid bifurcation demonstrated only about a 30 to 40% stenosis in the proximal internal carotid artery.  I then turned my attention to the innominate stenosis.  Cannulating the innominate artery proved to be quite difficult, but ultimately after  trying multiple catheters including headhunter, JB2, Bentson Hanifin the, VTEK, and JR4 catheters initially without success, a Simmons 1 catheter was eventually used to get access to the innominate artery.  It was still quite tedious and difficult to cross the nearly occlusive calcific stenosis as this was laying more to the left and the catheter generally was leading towards the right.  A variety of wires including a Glidewire, a Glidewire advantage, and a 0.018 advantage wire were used with eventually the 0.018 wire being the one I was able to cross the lesion with and get the [facility] catheter at and just beyond the high-grade stenosis.  Imaging was performed showing only about a 30% stenosis in the right internal carotid artery with very sluggish intracranial flow initially.  I then was able to get the 0.018 advantage wire out and then passed out the subclavian artery all the way through the axillary and down to the brachial artery.  The Bentson Hanifin catheter was then taken over this and out into the arm artery where intraluminal flow was confirmed and then switched for a 0.035 advantage wire.  Once this wire was out, I was able to advance to a 65 cm 8 French sheath up to the origin of the innominate artery but it would not cross the stenosis.  I predilated the lesion with a 4, a 6, and finally an 8 mm balloon before I was able to get the sheath across the stenosis.  After the 8 mm balloon, the sheath  was taken through the stenosis and actually initially parked in the proximal subclavian artery.  I then brought in embolic protection device on the field.  This was the large Nav 6 embolic protection device.  The sheath was pulled back to the true innominate artery but above the stenosis and then the embolic protection device was taken up into the internal carotid artery through the common carotid artery and deployed in the internal carotid artery.  At this point, the 0.035 advantage wire was removed and we  worked over the embolic protection wire.  A 12 mm diameter by 38 mm length Lifestream stent was then selected and deployed in the innominate artery taking it back about 2 to 3 mm into the aorta to entirely encompass the lesion.  The balloon was inflated to 12 atm.  Although there was still some constraint in the area, there is now only about a 15 to 20% residual stenosis which was markedly improved.  I then retrieved the embolic protection device after our completion images.  At this point I elected to terminate the procedure. The sheath was removed and StarClose closure device was deployed in the right femoral artery with excellent hemostatic result. The patient was taken to the recovery room in stable condition having tolerated the procedure well.  COMPLICATIONS: none  CONDITION: stable  [name] 04/20/2023 11:28 AM   This note was created with Dragon Medical transcription system. Any errors in dictation are purely unintentional.

## 2023-04-20 NOTE — Interval H&P Note (Signed)
History and Physical Interval Note:  04/20/2023 9:29 AM  Christine Burnett  has presented today for surgery, with the diagnosis of TIA.  The various methods of treatment have been discussed with the patient and family. After consideration of risks, benefits and other options for treatment, the patient has consented to  Procedure(s) with comments: CAROTID PTA/STENT INTERVENTION (Right) - Inominate Artery Stenosis requiring stenting as a surgical intervention.  The patient's history has been reviewed, patient examined, no change in status, stable for surgery.  I have reviewed the patient's chart and labs.  Questions were answered to the patient's satisfaction.     [name]

## 2023-04-20 NOTE — Progress Notes (Signed)
Bedside report and handoff given to [facility].

## 2023-04-21 DIAGNOSIS — I6521 Occlusion and stenosis of right carotid artery: Secondary | ICD-10-CM | POA: Diagnosis not present

## 2023-04-21 LAB — CBC
HCT: 29.2 % — ABNORMAL LOW (ref 36.0–46.0)
Hemoglobin: 9.3 g/dL — ABNORMAL LOW (ref 12.0–15.0)
MCH: 33.1 pg (ref 26.0–34.0)
MCHC: 31.8 g/dL (ref 30.0–36.0)
MCV: 103.9 fL — ABNORMAL HIGH (ref 80.0–100.0)
Platelets: 172 10*3/uL (ref 150–400)
RBC: 2.81 MIL/uL — ABNORMAL LOW (ref 3.87–5.11)
RDW: 13.7 % (ref 11.5–15.5)
WBC: 9.3 10*3/uL (ref 4.0–10.5)
nRBC: 0 % (ref 0.0–0.2)

## 2023-04-21 LAB — BASIC METABOLIC PANEL
Anion gap: 7 (ref 5–15)
BUN: 25 mg/dL — ABNORMAL HIGH (ref 8–23)
CO2: 23 mmol/L (ref 22–32)
Calcium: 8.7 mg/dL — ABNORMAL LOW (ref 8.9–10.3)
Chloride: 106 mmol/L (ref 98–111)
Creatinine, Ser: 1.48 mg/dL — ABNORMAL HIGH (ref 0.44–1.00)
GFR, Estimated: 35 mL/min — ABNORMAL LOW (ref >60)
Glucose, Bld: 133 mg/dL — ABNORMAL HIGH (ref 70–99)
Potassium: 4.5 mmol/L (ref 3.5–5.1)
Sodium: 136 mmol/L (ref 135–145)

## 2023-04-21 MED ORDER — FLUTICASONE FUROATE-VILANTEROL 100-25 MCG/ACT IN AEPB
1.0000 | INHALATION_SPRAY | Freq: Every day | RESPIRATORY_TRACT | Status: DC
  Administered 2023-04-21 – 2023-04-23 (×3): 1 via RESPIRATORY_TRACT
  Filled 2023-04-21: qty 28

## 2023-04-21 NOTE — Progress Notes (Signed)
Pt transported to 1C, room 126 via bed on telemetry and accompanied by NT and RN.

## 2023-04-21 NOTE — Plan of Care (Signed)
  Problem: Education: Goal: Knowledge of disease or condition will improve Outcome: Progressing Goal: Knowledge of secondary prevention will improve (MUST DOCUMENT ALL) Outcome: Progressing Goal: Knowledge of patient specific risk factors will improve Loraine Leriche N/A or DELETE if not current risk factor) Outcome: Progressing   Problem: Ischemic Stroke/TIA Tissue Perfusion: Goal: Complications of ischemic stroke/TIA will be minimized Outcome: Progressing   Problem: Coping: Goal: Will verbalize positive feelings about self Outcome: Progressing Goal: Will identify appropriate support needs Outcome: Progressing   Problem: Health Behavior/Discharge Planning: Goal: Ability to manage health-related needs will improve Outcome: Progressing Goal: Goals will be collaboratively established with patient/family Outcome: Progressing   Problem: Self-Care: Goal: Ability to participate in self-care as condition permits will improve Outcome: Progressing Goal: Ability to communicate needs accurately will improve Outcome: Progressing   Problem: Nutrition: Goal: Risk of aspiration will decrease Outcome: Progressing Goal: Dietary intake will improve Outcome: Progressing   Problem: Education: Goal: Knowledge of General Education information will improve Description: Including pain rating scale, medication(s)/side effects and non-pharmacologic comfort measures Outcome: Progressing   Problem: Pain Management: Goal: General experience of comfort will improve Outcome: Progressing

## 2023-04-21 NOTE — Progress Notes (Signed)
PROGRESS NOTE    Christine Burnett   ZOX:[id number] DOB: Oct 09, 1940  DOA: 04/17/2023 Date of Service: 04/21/23 which is hospital day 2  PCP: Danelle Berry, PA-C    HPI: Christine Burnett is a 82 y.o. 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 However her symptoms gradually improved and she was back at her baseline. Later that day she was at a store and noticed uncontrollable twitching of her left leg which started in her groin and then progressed down the leg, lasting a few minutes and then resolving. Postevent she had weakness in that leg which gradually resolved again. She came into the ED for further evaluation.   Hospital course / significant events:  11/12: admitted to hospitalist service for TIA w/u. MRI brain concerning for possible tiny acute ischemic infarcts and chronic hemorrhage in the anterior right frontal lobe, fairly cortical. Pending Echo and carotid US.  11/13: Per neurology, punctate possible strokes on MRI do not fit the clinical history provided, therefore favor these to be artifact and proceeding w/ EEG, MRA H/N. Echo no evidence for LA enlargement no need for Zio monitor. Seizure precautions, initiating lamotrigine and d/c BuSpar.  11/14: substantial atherosclerotic disease on MRA, CTA H/N especially involving  right internal carotid artery - high concern that her left leg weakness spells were limb shaking TIA. Vascular team here to see her to discuss intervention  11/15: angio w/ stent to R innominate artery  11/16:  doing well postop, moved out of ICU, BP running low and pt fatigued, IV fluids administered will monitor. Per vascular surgery ok to discharge home when medically stable on ASA/Brilinta and f/u 2 weeks   Consultants:  Neurology  Vascular surgery   Procedures/Surgeries: 04/20/23 angio w/ stent to R innominate artery       ASSESSMENT & PLAN:   R ICA stenosis   Vascular sugery applied stent 04/20/23   See below re: other TIA w/u and neuro recs   TIA, possible seizure-like activity but more concern for limb-shaking TIA Echo no LA enlargement, Afib less likely  R/o acute CVA EEG nonfocal R ICA occlusion on vascular imaging - see above  D/c BuSpar Continue on Lamotrigine  Seizure precautions  Statin Vascular surgery address ICA as above  ok from vascular standpoint to d/c home on ASA/Brilinta, follow in 2 weeks with Dr. Wyn Quaker. Thanks   AKI on CKD3b likely d/t dehydration/contrast - improving toward baseline  Caution w/ contrast studies Follow BMP   Chest pain - resolved Repeat troponin / EKG as needed   Essential hypertension Holding antihypertensives for now    Dyslipidemia Statin Lipid panel   Anxiety and depression D/c BuSpar and starting Lamotrigine per neurology  Continue Paxil Low dose Xanax ok prn    Chronic obstructive pulmonary disease (COPD) (HCC) Albuterol prn     Normal weight based on BMI: Body mass index is 22.06 kg/m.  Underweight - under 18.5  normal weight - 18.5 to 24.9 overweight - 25 to 29.9 obese - 30 or more   DVT prophylaxis: ambulation IV fluids: no continuous IV fluids  Nutrition: regular Central  lines / invasive devices: none  Code Status: FULL CODE ACP documentation reviewed: 04/18/23 and none on file in [facility]  TOC needs: none at this time Barriers to dispo / significant pending items: EEG, CTA, monitor renal fxn, may need further consultation/intervention pending results              Subjective / Brief ROS:   Patient reports doing well - examined this morning in ICU, in good spirits   No new weakness  Denies CP/SOB.  Pain controlled.    Family Communication:daughter is at bedside on rounds     Objective Findings:  Vitals:   04/21/23 1230 04/21/23 1300 04/21/23 1330 04/21/23 1414  BP: 99/79 (!) 100/56 (!) 104/59 (!) 99/48  Pulse: 99 97 93 98  Resp: 19 19 (!) 28   Temp:    98 F (36.7 C)  TempSrc:    Oral  SpO2: 94% 94% 99% 95%  Weight:      Height:        Intake/Output Summary (Last 24 hours) at 04/21/2023 1430 Last data filed at 04/21/2023 1419 Gross per 24 hour  Intake 2429.54 ml  Output 850 ml  Net 1579.54 ml   Filed Weights   04/18/23 0039 04/20/23 0900 04/20/23 1718  Weight: 62 kg 62 kg 63.2 kg    Examination:  Physical Exam Constitutional:      General: She is not in acute distress. Cardiovascular:     Rate and Rhythm: Normal rate and regular rhythm.  Pulmonary:     Effort: Pulmonary effort is normal.     Breath sounds: Normal breath sounds. Wheezes: scattered, mild.  Musculoskeletal:     Right lower leg: No edema.     Left lower leg: No edema.  Neurological:     General: No focal deficit present.     Mental Status: She is alert and oriented to person, place, and time. Mental status is at baseline.  Psychiatric:        Mood and Affect: Mood normal.        Behavior: Behavior normal.          Scheduled Medications:   ALPRAZolam  0.5 mg Oral Once   aspirin EC  81 mg Oral QHS   atorvastatin  40 mg Oral QHS   calcium carbonate  300 mg of elemental calcium Oral Q MTWThF   Chlorhexidine Gluconate Cloth  6 each Topical Q0600   cholecalciferol  2,000 Units Oral QHS   enoxaparin (LOVENOX) injection  30 mg Subcutaneous Q24H   fluticasone furoate-vilanterol  1 puff Inhalation Daily   lamoTRIgine  25 mg Oral QHS   melatonin  2.5 mg Oral QHS   multivitamin with minerals  1 tablet Oral Daily   PARoxetine  40 mg Oral q morning   ticagrelor  90 mg Oral BID     Continuous Infusions:  sodium chloride     famotidine (PEPCID) IV Stopped (04/21/23 1034)   magnesium sulfate bolus IVPB      PRN Medications:  sodium chloride, acetaminophen **OR** acetaminophen (TYLENOL) oral liquid 160 mg/5 mL **OR** acetaminophen, acetaminophen **OR** acetaminophen, albuterol, alum & mag hydroxide-simeth, guaiFENesin-dextromethorphan, hydrALAZINE, labetalol, magnesium sulfate bolus IVPB, metoprolol tartrate, ondansetron (ZOFRAN) IV, oxyCODONE-acetaminophen, phenol, potassium chloride, senna-docusate, traZODone  Antimicrobials from admission:  Anti-infectives (From admission, onward)    Start     Dose/Rate Route Frequency Ordered Stop   04/20/23 2200  vancomycin (VANCOCIN) IVPB 1000 mg/200 mL premix        1,000 mg  200 mL/hr over 60 Minutes Intravenous Every 12 hours 04/20/23 1152 04/21/23 1000   04/20/23 0835  vancomycin (VANCOCIN) IVPB 1000 mg/200 mL premix        1,000 mg 200 mL/hr over 60 Minutes Intravenous 60 min pre-op 04/20/23 0835 04/20/23 1026           Data Reviewed:  I have personally reviewed the following...  CBC: Recent Labs  Lab 04/17/23 1843 04/20/23 0438 04/21/23 0342  WBC 5.5 5.9 9.3  HGB 12.1 12.0 9.3*  HCT 36.5 35.7* 29.2*  MCV 101.7* 97.8 103.9*  PLT 199 200 172   Basic Metabolic Panel: Recent Labs  Lab 04/17/23 1843 04/19/23 0838 04/20/23 0438 04/21/23 0342  NA 136 136 142 136  K 4.0 4.3 4.5 4.5  CL 103 102 106 106  CO2 24 26 26 23   GLUCOSE 101* 106* 108* 133*  BUN 27* 28* 27* 25*  CREATININE 1.53* 1.80* 1.77* 1.48*  CALCIUM 9.2 9.4 9.5 8.7*   GFR: Estimated Creatinine Clearance: 27 mL/min (A) (by C-G formula based on SCr of 1.48 mg/dL (H)). Liver Function Tests: No results for input(s): "AST", "ALT", "ALKPHOS", "BILITOT", "PROT", "ALBUMIN" in the last 168 hours. No results for input(s): "LIPASE", "AMYLASE" in the last 168 hours. No results for input(s): "AMMONIA" in the last 168 hours. Coagulation  Profile: No results for input(s): "INR", "PROTIME" in the last 168 hours. Cardiac Enzymes: No results for input(s): "CKTOTAL", "CKMB", "CKMBINDEX", "TROPONINI" in the last 168 hours. BNP (last 3 results) No results for input(s): "PROBNP" in the last 8760 hours. HbA1C: No results for input(s): "HGBA1C" in the last 72 hours.  CBG: Recent Labs  Lab 04/20/23 1722  GLUCAP 110*   Lipid Profile: No results for input(s): "CHOL", "HDL", "LDLCALC", "TRIG", "CHOLHDL", "LDLDIRECT" in the last 72 hours.  Thyroid Function Tests: No results for input(s): "TSH", "T4TOTAL", "FREET4", "T3FREE", "THYROIDAB" in the last 72 hours. Anemia Panel: No results for input(s): "VITAMINB12", "FOLATE", "FERRITIN", "TIBC", "IRON", "RETICCTPCT" in the last 72 hours. Most Recent Urinalysis On File:     Component Value Date/Time   COLORURINE YELLOW (A) 04/17/2023 2133   APPEARANCEUR CLEAR (A) 04/17/2023 2133   APPEARANCEUR Clear 01/28/2016 0841   LABSPEC 1.013 04/17/2023 2133   PHURINE 6.0 04/17/2023 2133   GLUCOSEU NEGATIVE 04/17/2023 2133   HGBUR NEGATIVE 04/17/2023 2133   BILIRUBINUR NEGATIVE 04/17/2023 2133   BILIRUBINUR neg 07/29/2019 1541   BILIRUBINUR Negative 01/28/2016 0841   KETONESUR NEGATIVE 04/17/2023 2133   PROTEINUR NEGATIVE 04/17/2023 2133   UROBILINOGEN 0.2 07/29/2019 1541   NITRITE NEGATIVE 04/17/2023 2133   LEUKOCYTESUR NEGATIVE 04/17/2023 2133   Sepsis Labs: @LABRCNTIP (procalcitonin:4,lacticidven:4) Microbiology: Recent Results (from the past 240 hour(s))  MRSA Next Gen by PCR, Nasal     Status: None   Collection Time: 04/20/23  5:24 PM   Specimen: Nasal Mucosa; Nasal Swab  Result Value Ref Range Status   MRSA by PCR Next Gen NOT DETECTED NOT DETECTED Final    Comment: (NOTE) The GeneXpert MRSA Assay (FDA approved for NASAL specimens only), is one component of a comprehensive MRSA colonization surveillance program. It is not intended to diagnose MRSA infection nor to guide or  monitor treatment for MRSA infections. Test performance is not FDA approved in patients less than 55 years old. Performed at [facility], [address], [place], [place] [address]       Radiology Studies last 3 days: PERIPHERAL VASCULAR CATHETERIZATION  Result Date: 04/20/2023 See surgical note for result.  EEG adult  Result Date: 04/18/2023 Christine Quest, MD     04/18/2023  8:52 PM Patient Name: Christine Burnett MRN: [id number] Epilepsy Attending: Charlsie Burnett Referring Physician/Provider: Gordy Councilman, MD Date: 04/18/2023 Duration: 26.06 mins Patient history: 82 yo F with rhythmic shaking of the left lower extremity with classic description of a jacksonian march. EEG to evaluate for seizure Level of alertness: Awake, asleep AEDs during EEG study: None Technical aspects: This EEG study was done with scalp electrodes positioned according to the 10-20 International system of electrode placement. Electrical activity was reviewed with band pass filter of 1-70Hz , sensitivity of 7 uV/mm, display speed of 18mm/sec with a 60Hz  notched filter applied as appropriate. EEG data were recorded continuously and digitally stored.  Video monitoring was available and reviewed as appropriate. Description: The posterior dominant rhythm consists of 8 Hz activity of moderate voltage (25-35 uV) seen predominantly in posterior head regions, symmetric and reactive to eye opening and eye closing. Sleep was characterized by vertex waves, sleep spindles (12 to 14 Hz), maximal frontocentral region. EEG showed intermittent generalized 3 to 6 Hz theta-delta slowing. Hyperventilation and photic stimulation were not performed.   ABNORMALITY - Intermittent slow, generalized IMPRESSION: This study is suggestive of mild diffuse encephalopathy. No seizures or epileptiform discharges were seen throughout the recording. Christine Burnett   CT ANGIO HEAD NECK W WO CM  Result Date: 04/18/2023 CLINICAL DATA:   Abnormality in the basilar artery on MRA, possible acute infarcts on MRI EXAM: CT ANGIOGRAPHY HEAD AND NECK WITH AND WITHOUT CONTRAST TECHNIQUE: Multidetector CT imaging of the head and neck was performed using the standard protocol during bolus administration of intravenous contrast. Multiplanar CT image reconstructions and MIPs were obtained to evaluate the vascular anatomy. Carotid stenosis measurements (when applicable) are obtained utilizing NASCET criteria, using the distal internal carotid diameter as the denominator. RADIATION DOSE REDUCTION: This exam was performed according to the departmental dose-optimization program which includes automated exposure control, adjustment of the mA and/or kV according to patient size and/or use of iterative reconstruction technique. CONTRAST:  75mL OMNIPAQUE IOHEXOL 350 MG/ML SOLN COMPARISON:  07/03/2014 CTA and, correlation is made with 04/18/2023 MRI head and MRA head and 04/17/2023 CT head FINDINGS: CT HEAD FINDINGS Brain: The infarcts noted on the recent MRI are not apparent on CT. No evidence of new acute infarct, hemorrhage, mass, mass effect, or midline shift. No hydrocephalus or extra-axial fluid collection. Periventricular white matter changes, likely the sequela of chronic small vessel ischemic disease. Vascular: No hyperdense vessel. Atherosclerotic calcifications in the intracranial carotid and vertebral arteries. Skull: Negative for fracture or focal lesion. Sinuses/Orbits: Mucosal thickening in the ethmoid air cells. No acute finding in the orbits. Other: The mastoid air cells are well aerated. CTA NECK FINDINGS Aortic arch: Standard branching. Imaged portion shows no evidence of aneurysm or dissection. Evaluation of the arch vessel origins is limited by beam hardening artifact from the adjacent contrast bolus, but there is at least 50% stenosis at each of the vessel origins. Right carotid system: No evidence of dissection, occlusion, or hemodynamically  significant stenosis (greater than 50%). Atherosclerotic disease at the bifurcation and in the proximal and distal ICA is not hemodynamically significant. Comparatively poor opacification of the right ICA compared to the left may be related to the degree of stenosis at the origin of the brachiocephalic artery. Left carotid system: No evidence of dissection, occlusion, or hemodynamically significant stenosis (greater than 50%). Atherosclerotic disease at the bifurcation and in  the proximal and distal ICA is not hemodynamically significant. Vertebral arteries: Poor visualization of the origin of the right vertebral artery secondary to the adjacent contrast bolus. Within that limitation, there is likely severe stenosis at the origin of the right vertebral artery, which is very poorly opacified until the V3 segment (series 10, image 27). Moderate stenosis at the origin of left vertebral artery, which is non dominant. The left vertebral artery is patent through the skull base. No evidence of dissection. Skeleton: No acute osseous abnormality. Degenerative changes in the cervical spine. Other neck: No acute finding. Upper chest: Emphysema. No focal pulmonary opacity or pleural effusion. Review of the MIP images confirms the above findings CTA HEAD FINDINGS Anterior circulation: Both internal carotid arteries are patent to the termini, with calcifications but without significant stenosis, although the right ICA is poorly opacified. 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 A1 segments patent. Normal anterior communicating artery. Anterior cerebral arteries are patent to their distal aspects without significant stenosis. No M1 stenosis or occlusion. MCA branches perfused to their distal aspects without significant stenosis. Opacification of the right ACA and MCA is  improved compared to the right ICA, likely reflecting collateral flow from the right posterior communicating artery. Posterior circulation: Moderate to severe stenosis in the proximal left V4 (series 10, images 155, 152, and 145). The left vertebral artery primarily supplies the left PICA, which is patent. Mild stenosis in the proximal right V4, which is otherwise patent to the vertebrobasilar junction. The right PICA is also patent. Given poor opacification of the extracranial V4, this is likely retrograde. Basilar patent to its distal aspect without significant stenosis. Superior cerebellar arteries patent proximally. Patent P1 segments. The bilateral posterior communicating arteries are patent. PCAs perfused to their distal aspects without significant stenosis. Venous sinuses: As permitted by contrast timing, patent. Anatomic variants: None significant. No evidence of aneurysm or vascular malformation. Review of the MIP images confirms the above findings IMPRESSION: 1. The infarcts noted on the recent MRI are not apparent on CT. No evidence of new acute infarct. 2. No intracranial large vessel occlusion. 3. 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 4. Poor opacification of the right ICA, which may be related to stenosis at the origin of the brachiocephalic artery. Improved opacification of the right ACA and MCA is likely collateral flow from the posterior communicating artery. 5. Moderate stenosis at the origin of the left vertebral artery, with additional moderate to severe stenosis in the proximal left V4. 6. 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 7. Evaluation of the arch vessel origins is limited by beam hardening artifact from the adjacent contrast bolus, but there is  likely at least 50% stenosis at each of the vessel origins. \ Electronically Signed   By: Wiliam Ke M.D.   On: 04/18/2023 19:41   US Carotid Bilateral  Result Date: 04/18/2023 CLINICAL DATA:  TIA Hyperlipidemia Former tobacco user EXAM: BILATERAL CAROTID DUPLEX ULTRASOUND TECHNIQUE: Wallace Cullens scale imaging, color Doppler and duplex ultrasound were performed of bilateral carotid and vertebral arteries in the neck. COMPARISON:  07/29/2018 FINDINGS: Criteria: Quantification of carotid stenosis is  based on velocity parameters that correlate the residual internal carotid diameter with NASCET-based stenosis levels, using the diameter of the distal internal carotid lumen as the denominator for stenosis measurement. The following velocity measurements were obtained: RIGHT ICA: 39/4 cm/sec CCA: 18/11 cm/sec SYSTOLIC ICA/CCA RATIO:  2.2 ECA: 50 cm/sec LEFT ICA: 242/36 cm/sec CCA: 77/15 cm/sec SYSTOLIC ICA/CCA RATIO:  3.1 ECA: 156 cm/sec RIGHT CAROTID ARTERY: Mild calcified plaque of the distal common carotid artery. Common carotid artery waveform is abnormal and monophasic. Internal carotid artery waveform is abnormal with reversal of systolic flow. RIGHT VERTEBRAL ARTERY:  Bidirectional flow. LEFT CAROTID ARTERY: Bulky calcified plaque noted at the distal left common and proximal internal carotid arteries. Common carotid artery waveform is abnormal and monophasic. LEFT VERTEBRAL ARTERY:  Antegrade flow. Right upper extremity blood pressure is significantly lower than left with right side measuring 130/98 mm Hg and left measuring 164/82 mm Hg. IMPRESSION: 1. Monophasic waveform in the right common carotid artery and reversal of systolic flow in right internal carotid artery consistent with significant proximal stenosis. The site of significant origin of the right brachiocephalic artery asymmetrically decreased right upper extremity blood pressure as well as bidirectional flow in the right vertebral artery. 2. Monophasic  waveform in the left common carotid artery consistent with significant stenosis at the origin of the common carotid artery. 3. 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 4. Further evaluation with CT angiography of the neck is recommended. Electronically Signed   By: Acquanetta Belling M.D.   On: 04/18/2023 15:32   MR ANGIO HEAD WO CONTRAST  Result Date: 04/18/2023 CLINICAL DATA:  Neuro deficit, acute, stroke suspected EXAM: MRA HEAD WITHOUT CONTRAST TECHNIQUE: Angiographic images of the Circle of Willis were acquired using MRA technique without intravenous contrast. 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 Normal flow void was visualized on same day brain MRI. There is reconstitution of the level of the ICA terminus via collaterals from the circle-of-Willis. There is normal arborization in the bilateral MCA territories. Posterior circulation: There is nearly absent flow void in the basilar artery with robust PCOM bilaterally. P2 and P3 segments demonstrate normal flow signal. Anatomic variants: Fetal PCAs bilaterally. Other: None. IMPRESSION: 1. Asymmetrically decreased flow void in the right petrous, cavernous, and supraclinoid ICA is worrisome for interval development of a new right ICA occlusion. There is reconstitution of the level of the ICA terminus via collaterals from the circle-of-Willis. Recommend further evaluation with a CT head and neck angiogram 2. Nearly absent flow void in the basilar artery with robust PCOM bilaterally. 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 Recommend attention on CTA. These results will be called to the ordering clinician or representative by the Radiologist Assistant, and communication documented in the PACS or [employer]. Electronically Signed   By: Lorenza Cambridge M.D.   On: 04/18/2023 14:48   ECHOCARDIOGRAM COMPLETE BUBBLE STUDY  Result Date: 04/18/2023    ECHOCARDIOGRAM REPORT   Patient Name:   Christine Burnett Date of Exam: 04/18/2023 Medical Rec #:  [id number]          Height:       66.0 in Accession #:    [id number]         Weight:       136.7 lb Date of  Birth:  1940-09-09          BSA:          1.701 m Patient Age:    82 years           BP:           149/74 mmHg Patient Gender: F                  HR:           93 bpm. Exam Location:  [facility] Procedure: 2D Echo, Cardiac Doppler, Color Doppler and Saline Contrast Bubble            Study Indications:     TIA 435.9 / G45.9  History:         Patient has prior history of Echocardiogram examinations, most                  recent 01/26/2021. COPD, TIA and Stroke; Risk                  Factors:Dyslipidemia.  Sonographer:     Cristela Blue Referring Phys:  [id number] JAN A MANSY Diagnosing Phys: Julien Nordmann MD IMPRESSIONS  1. Left ventricular ejection fraction, by estimation, is 55 to 60%. The left ventricle has normal function. The left ventricle has no regional wall motion abnormalities. Left ventricular diastolic parameters are consistent with Grade I diastolic dysfunction (impaired relaxation).  2. Right ventricular systolic function is normal. The right ventricular size is normal. There is normal pulmonary artery systolic pressure. The estimated right ventricular systolic pressure is 33.3 mmHg.  3. The mitral valve is normal in structure. Moderate mitral valve regurgitation. No evidence of mitral stenosis.  4. Tricuspid valve regurgitation is moderate.  5. The aortic valve is normal in structure. Aortic valve regurgitation is mild. Aortic valve sclerosis/calcification is present, without any evidence of aortic stenosis.  6. The inferior vena cava is normal in size with greater than 50% respiratory variability, suggesting right atrial pressure of 3 mmHg.  7. Agitated saline  contrast bubble study was negative, with no evidence of any interatrial shunt. FINDINGS  Left Ventricle: Left ventricular ejection fraction, by estimation, is 55 to 60%. The left ventricle has normal function. The left ventricle has no regional wall motion abnormalities. The left ventricular internal cavity size was normal in size. There is  no left ventricular hypertrophy. Left ventricular diastolic parameters are consistent with Grade I diastolic dysfunction (impaired relaxation). Right Ventricle: The right ventricular size is normal. No increase in right ventricular wall thickness. Right ventricular systolic function is normal. There is normal pulmonary artery systolic pressure. The tricuspid regurgitant velocity is 2.66 m/s, and  with an assumed right atrial pressure of 5 mmHg, the estimated right ventricular systolic pressure is 33.3 mmHg. Left Atrium: Left atrial size was normal in size. Right Atrium: Right atrial size was normal in size. Pericardium: There is no evidence of pericardial effusion. Mitral Valve: The mitral valve is normal in structure. There is mild calcification of the mitral valve leaflet(s). Moderate mitral valve regurgitation. No evidence of mitral valve stenosis. MV peak gradient, 10.4 mmHg. The mean mitral valve gradient is 4.0 mmHg. Tricuspid Valve: The tricuspid valve is normal in structure. Tricuspid valve regurgitation is moderate . No evidence of tricuspid stenosis. Aortic Valve: The aortic valve is normal in structure. Aortic valve regurgitation is mild. Aortic valve sclerosis/calcification is present, without any evidence of aortic stenosis. Aortic valve mean gradient measures 4.2 mmHg. Aortic valve peak  gradient measures 7.0 mmHg. Aortic valve area, by VTI measures 2.26 cm. Pulmonic Valve: The pulmonic valve was normal in structure. Pulmonic valve regurgitation is not visualized. No evidence of pulmonic stenosis. Aorta: The aortic root is normal in size and structure. Venous: The  inferior vena cava is normal in size with greater than 50% respiratory variability, suggesting right atrial pressure of 3 mmHg. IAS/Shunts: No atrial level shunt detected by color flow Doppler. Agitated saline contrast was given intravenously to evaluate for intracardiac shunting. Agitated saline contrast bubble study was negative, with no evidence of any interatrial shunt. There  is no evidence of a patent foramen ovale. There is no evidence of an atrial septal defect.  LEFT VENTRICLE PLAX 2D LVIDd:         4.10 cm   Diastology LVIDs:         2.40 cm   LV e' medial:    5.11 cm/s LV PW:         1.00 cm   LV E/e' medial:  13.5 LV IVS:        1.00 cm   LV e' lateral:   5.98 cm/s LVOT diam:     2.00 cm   LV E/e' lateral: 11.6 LV SV:         59 LV SV Index:   35 LVOT Area:     3.14 cm  RIGHT VENTRICLE RV Basal diam:  2.20 cm RV Mid diam:    1.50 cm LEFT ATRIUM           Index        RIGHT ATRIUM          Index LA diam:      2.40 cm 1.41 cm/m   RA Area:     9.17 cm LA Vol (A2C): 18.4 ml 10.82 ml/m  RA Volume:   16.70 ml 9.82 ml/m LA Vol (A4C): 26.5 ml 15.58 ml/m  AORTIC VALVE AV Area (Vmax):    1.84 cm AV Area (Vmean):   1.76 cm AV Area (VTI):     2.26 cm AV Vmax:           132.50 cm/s AV Vmean:          97.350 cm/s AV VTI:            0.260 m AV Peak Grad:      7.0 mmHg AV Mean Grad:      4.2 mmHg LVOT Vmax:         77.40 cm/s LVOT Vmean:        54.600 cm/s LVOT VTI:          0.187 m LVOT/AV VTI ratio: 0.72  AORTA Ao Root diam: 2.90 cm MITRAL VALVE                TRICUSPID VALVE MV Area (PHT): 8.62 cm     TR Peak grad:   28.3 mmHg MV Area VTI:   2.27 cm     TR Vmax:        266.00 cm/s MV Peak grad:  10.4 mmHg MV Mean grad:  4.0 mmHg     SHUNTS MV Vmax:       1.61 m/s     Systemic VTI:  0.19 m MV Vmean:      93.8 cm/s    Systemic Diam: 2.00 cm MV Decel Time: 88 msec MV E velocity: 69.20 cm/s MV A velocity: 123.00 cm/s MV E/A ratio:  0.56 Julien Nordmann MD Electronically signed  by Julien Nordmann MD Signature  Date/Time: 04/18/2023/11:16:32 AM    Final    MR BRAIN WO CONTRAST  Result Date: 04/18/2023 CLINICAL DATA:  Initial evaluation for acute TIA. EXAM: MRI HEAD WITHOUT CONTRAST TECHNIQUE: Multiplanar, multiecho pulse sequences of the brain and surrounding structures were obtained without intravenous contrast. COMPARISON:  Prior CT from 04/17/2023. FINDINGS: Brain: Diffuse prominence of the CSF containing spaces compatible generalized cerebral atrophy. Patchy and confluent T2/FLAIR hyperintensity involving the periventricular and deep white matter both cerebral hemispheres as well as the pons, most likely related to chronic microvascular ischemic disease, advanced in nature. Small remote bilateral cerebellar infarcts, left greater than right. There is question of a punctate focus of restricted diffusion positioned along the midline of the splenium, seen only on coronal DWI sequence (series 18, image 24). Additionally, there is a questionable punctate focus of diffusion signal abnormality within the right cerebellar hemisphere as well (series 9, image 62). While these findings could be artifactual in nature, possible tiny acute ischemic infarcts are difficult to exclude, and could be considered in correct clinical setting. No associated hemorrhage. No other evidence for acute or subacute ischemia. Gray-white matter differentiation otherwise maintained. No acute intracranial hemorrhage. Small focus of chronic hemosiderin staining noted at the high anterior right frontal lobe (series 14, image 50), suggesting prior hemorrhage at this location. No mass lesion, midline shift or mass effect. No hydrocephalus or extra-axial fluid collection. Pituitary gland suprasellar region within normal limits. Vascular: Major intracranial vascular flow voids are maintained. Skull and upper cervical spine: Craniocervical junctional limits. Bone marrow signal intensity heterogeneous but overall within normal limits. No scalp soft tissue  abnormality. Sinuses/Orbits: Globes and orbital soft tissues within normal limits. Scattered postop thickening present about the ethmoidal air cells. Paranasal sinuses are otherwise clear. No mastoid effusion. Other: None. IMPRESSION: 1. Question punctate foci of restricted diffusion involving the mid splenium and within the right cerebellar hemisphere as above. While these findings could be artifactual in nature, possible tiny acute ischemic infarcts are difficult to exclude, and could be considered in correct clinical setting. No associated hemorrhage. 2. 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 Electronically Signed   By: Rise Mu M.D.   On: 04/18/2023 02:09   CT HEAD WO CONTRAST  Result Date: 04/17/2023 CLINICAL DATA:  A shin for acute blurry vision, speech difficulty, left lower extremity weakness. EXAM: CT HEAD WITHOUT CONTRAST TECHNIQUE: Contiguous axial images were obtained from the base of the skull through the vertex without intravenous contrast. RADIATION DOSE REDUCTION: This exam was performed according to the departmental dose-optimization program which includes automated exposure control, adjustment of the mA and/or kV according to patient size and/or use of iterative reconstruction technique. COMPARISON:  Prior study from 07/13/2020. FINDINGS: Brain: Moderately advanced cerebral atrophy. 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 Remote left cerebellar infarct noted. No acute intracranial hemorrhage. No acute large vessel territory infarct. No mass lesion or midline shift. No hydrocephalus or extra-axial fluid collection. Vascular: No abnormal hyperdense vessel. Calcified atherosclerosis present at the skull base. Skull: Scalp soft tissues demonstrate no  acute finding. Calvarium intact. Sinuses/Orbits: Globes orbital soft tissues within normal limits. Mild mucosal thickening present about the left ethmoidal air cells. Visualized paranasal sinuses are otherwise clear. No mastoid effusion. Other: None. IMPRESSION: 1. No acute intracranial abnormality. 2. Moderately advanced cerebral atrophy with advanced chronic microvascular  ischemic disease. 3. Remote left cerebellar infarct. Electronically Signed   By: Rise Mu M.D.   On: 04/17/2023 20:01       Time spent: 50 min    Sunnie Nielsen, DO [facility] 04/21/2023, 2:30 PM    Dictation software may have been used to generate the above note. Typos may occur and escape review in typed/dictated notes. Please contact Dr Lyn Hollingshead directly for clarity if needed.  Staff may message me via secure chat in Epic  but this may not receive an immediate response,  please page me for urgent matters!  If 7PM-7AM, please contact night coverage [email or web address]

## 2023-04-21 NOTE — Plan of Care (Signed)
  Problem: Education: Goal: Knowledge of disease or condition will improve Outcome: Progressing Goal: Knowledge of secondary prevention will improve (MUST DOCUMENT ALL) Outcome: Progressing Goal: Knowledge of patient specific risk factors will improve Loraine Leriche N/A or DELETE if not current risk factor) Outcome: Progressing   Problem: Ischemic Stroke/TIA Tissue Perfusion: Goal: Complications of ischemic stroke/TIA will be minimized Outcome: Progressing   Problem: Nutrition: Goal: Risk of aspiration will decrease Outcome: Progressing Goal: Dietary intake will improve Outcome: Progressing   Problem: Education: Goal: Knowledge of General Education information will improve Description: Including pain rating scale, medication(s)/side effects and non-pharmacologic comfort measures Outcome: Progressing   Problem: Elimination: Goal: Will not experience complications related to bowel motility Outcome: Progressing Goal: Will not experience complications related to urinary retention Outcome: Progressing   Problem: Activity: Goal: Risk for activity intolerance will decrease Outcome: Progressing

## 2023-04-21 NOTE — Progress Notes (Signed)
Report called to 1C; care nurse Eboni.

## 2023-04-21 NOTE — Progress Notes (Signed)
1 Day Post-Op   Subjective/Chief Complaint: Doing well. Denies pain. Per nursing she received a Percocet and has been minimally confused. No neurologic symptoms.   Objective: Vital signs in last 24 hours: Temp:  [97.7 F (36.5 C)-98.6 F (37 C)] 98.6 F (37 C) (11/16 0836) Pulse Rate:  [85-105] 93 (11/16 1000) Resp:  [11-29] 23 (11/16 1000) BP: (97-190)/(50-109) 97/85 (11/16 1000) SpO2:  [88 %-100 %] 95 % (11/16 1000) Weight:  [63.2 kg] 63.2 kg (11/15 1718) Last BM Date : 04/19/23  Intake/Output from previous day: 11/15 0701 - 11/16 0700 In: 1875.6 [I.V.:1625.5; IV Piggyback:250.1] Out: 100 [Urine:100] Intake/Output this shift: Total I/O In: 398.4 [I.V.:200.8; IV Piggyback:197.6] Out: 150 [Urine:150]  General appearance: alert and no distress Cardio: regular rate and rhythm Extremities: warm, RIGHT groin access site- PAD removed, dried blood, +ecchymosis, however, soft, no hematoma Neurologic: Grossly normal  Lab Results:  Recent Labs    04/20/23 0438 04/21/23 0342  WBC 5.9 9.3  HGB 12.0 9.3*  HCT 35.7* 29.2*  PLT 200 172   BMET Recent Labs    04/20/23 0438 04/21/23 0342  NA 142 136  K 4.5 4.5  CL 106 106  CO2 26 23  GLUCOSE 108* 133*  BUN 27* 25*  CREATININE 1.77* 1.48*  CALCIUM 9.5 8.7*   PT/INR No results for input(s): "LABPROT", "INR" in the last 72 hours. ABG No results for input(s): "PHART", "HCO3" in the last 72 hours.  Invalid input(s): "PCO2", "PO2"  Studies/Results: PERIPHERAL VASCULAR CATHETERIZATION  Result Date: 04/20/2023 See surgical note for result.   Anti-infectives: Anti-infectives (From admission, onward)    Start     Dose/Rate Route Frequency Ordered Stop   04/20/23 2200  vancomycin (VANCOCIN) IVPB 1000 mg/200 mL premix        1,000 mg 200 mL/hr over 60 Minutes Intravenous Every 12 hours 04/20/23 1152 04/21/23 1000   04/20/23 0835  vancomycin (VANCOCIN) IVPB 1000 mg/200 mL premix        1,000 mg 200 mL/hr over 60  Minutes Intravenous 60 min pre-op 04/20/23 0835 04/20/23 1026       Assessment/Plan: s/p Procedure(s) with comments: CAROTID PTA/STENT INTERVENTION (Right) - Inominate Artery Stenosis requiring stenting   OK for transfer to telemetry later and discharge when otherwise medically stable OOB with PT No further Vascular Surgery Intervention at this time. Follow up in office in 2 weeks Continue ASA/Brilinta  LOS: 2 days    Eli Hose A 04/21/2023

## 2023-04-21 NOTE — Progress Notes (Signed)
PT Cancellation Note  Patient Details Name: Christine Burnett MRN: [id number] DOB: Aug 29, 1940   Cancelled Treatment:    Reason Eval/Treat Not Completed: Other (comment). Per OT pt refusing therapy or PT attempt this PM, BP also 99/48 in supine RN aware. PT to re-attempt as able.    Olga Coaster PT, DPT 2:19 PM,04/21/23

## 2023-04-21 NOTE — Progress Notes (Signed)
OT Cancellation Note  Patient Details Name: Christine Burnett MRN: [id number] DOB: 1940-07-12   Cancelled Treatment:    Reason Eval/Treat Not Completed: Fatigue/lethargy limiting ability to participate;Medical issues which prohibited therapy (Pt declined to work with OT at this time d/t fatigue. BP also 99/48 in supine. RN present and reported plan to give bolus. Will re-attempt as able.)  Gerrie Nordmann 04/21/2023, 2:25 PM

## 2023-04-22 ENCOUNTER — Inpatient Hospital Stay: Payer: Medicare Other

## 2023-04-22 DIAGNOSIS — I6521 Occlusion and stenosis of right carotid artery: Secondary | ICD-10-CM | POA: Diagnosis not present

## 2023-04-22 LAB — CBC
HCT: 23.3 % — ABNORMAL LOW (ref 36.0–46.0)
Hemoglobin: 7.6 g/dL — ABNORMAL LOW (ref 12.0–15.0)
MCH: 33.6 pg (ref 26.0–34.0)
MCHC: 32.6 g/dL (ref 30.0–36.0)
MCV: 103.1 fL — ABNORMAL HIGH (ref 80.0–100.0)
Platelets: 132 10*3/uL — ABNORMAL LOW (ref 150–400)
RBC: 2.26 MIL/uL — ABNORMAL LOW (ref 3.87–5.11)
RDW: 13.9 % (ref 11.5–15.5)
WBC: 7.6 10*3/uL (ref 4.0–10.5)
nRBC: 0 % (ref 0.0–0.2)

## 2023-04-22 LAB — BASIC METABOLIC PANEL
Anion gap: 7 (ref 5–15)
BUN: 23 mg/dL (ref 8–23)
CO2: 23 mmol/L (ref 22–32)
Calcium: 8.7 mg/dL — ABNORMAL LOW (ref 8.9–10.3)
Chloride: 105 mmol/L (ref 98–111)
Creatinine, Ser: 1.65 mg/dL — ABNORMAL HIGH (ref 0.44–1.00)
GFR, Estimated: 31 mL/min — ABNORMAL LOW (ref >60)
Glucose, Bld: 109 mg/dL — ABNORMAL HIGH (ref 70–99)
Potassium: 3.9 mmol/L (ref 3.5–5.1)
Sodium: 135 mmol/L (ref 135–145)

## 2023-04-22 LAB — PREPARE RBC (CROSSMATCH)

## 2023-04-22 LAB — OCCULT BLOOD X 1 CARD TO LAB, STOOL: Fecal Occult Bld: NEGATIVE

## 2023-04-22 LAB — HEMOGLOBIN AND HEMATOCRIT, BLOOD
HCT: 27 % — ABNORMAL LOW (ref 36.0–46.0)
Hemoglobin: 9.1 g/dL — ABNORMAL LOW (ref 12.0–15.0)

## 2023-04-22 MED ORDER — SODIUM CHLORIDE 0.9% IV SOLUTION
Freq: Once | INTRAVENOUS | Status: AC
Start: 2023-04-22 — End: 2023-04-23

## 2023-04-22 MED ORDER — ALPRAZOLAM 0.5 MG PO TABS
0.5000 mg | ORAL_TABLET | Freq: Three times a day (TID) | ORAL | Status: DC | PRN
  Administered 2023-04-22: 0.5 mg via ORAL
  Filled 2023-04-22: qty 1

## 2023-04-22 MED ORDER — PANTOPRAZOLE SODIUM 40 MG IV SOLR
40.0000 mg | Freq: Two times a day (BID) | INTRAVENOUS | Status: DC
  Administered 2023-04-22 – 2023-04-23 (×3): 40 mg via INTRAVENOUS
  Filled 2023-04-22 (×3): qty 10

## 2023-04-22 NOTE — Progress Notes (Signed)
Per Dr Lyn Hollingshead, dc tele monitoring order and stroke/NIH orders

## 2023-04-22 NOTE — Progress Notes (Addendum)
PROGRESS NOTE    ASIA PSOMAS   FAO:[id number] DOB: 10/29/1940  DOA: 04/17/2023 Date of Service: 04/22/23 which is hospital day 3  PCP: Danelle Berry, PA-C    HPI: Christine Burnett is a 82 y.o. 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 However her symptoms gradually improved and she was back at her baseline. Later that day she was at a store and noticed uncontrollable twitching of her left leg which started in her groin and then progressed down the leg, lasting a few minutes and then resolving. Postevent she had weakness in that leg which gradually resolved again. She came into the ED for further evaluation.   Hospital course / significant events:  11/12: admitted to hospitalist service for TIA w/u. MRI brain concerning for possible tiny acute ischemic infarcts and chronic hemorrhage in the anterior right frontal lobe, fairly cortical. Pending Echo and carotid US.  11/13: Per neurology, punctate possible strokes on MRI do not fit the clinical history provided, therefore favor these to be artifact and proceeding w/ EEG, MRA H/N. Echo no evidence for LA enlargement no need for Zio monitor. Seizure precautions, initiating lamotrigine and d/c BuSpar.  11/14: substantial atherosclerotic disease on MRA, CTA H/N especially involving  right internal carotid artery - high concern that her left leg weakness spells were limb shaking TIA. Vascular team here to see her to discuss intervention  11/15: angio w/ stent to R innominate artery. Hgb  12.0. 11/16: doing well postop, moved out of ICU, BP running low. Hgb to 9.3. Per vascular surgery ok to discharge home when medically stable on ASA/Brilinta and f/u 2 weeks - monitoring overnight d/t lower BP  11/17: Hgb down to 7.6 w/ fatigue. No s/s GI/GU bleeding, she does have ecchymosis to R groin at cath site but no pain. CT C/A/P no retroperitoneal bleed or other obvious bleeding. 1 unit PRBC, repeat HH pending. Hemoccult pending, consult to GI.   Consultants:  Neurology  Vascular surgery   Procedures/Surgeries: 04/20/23 angio w/ stent to R innominate artery       ASSESSMENT & PLAN:   Anemia, concern for ABLA Holding lovenox Will continue ASA/Brilinta for recent stent CT chest/abd/pelv wo contrast - no hematoma, (+)thickening sigmoid and diverticulosis but no obvious bleed IV PPI GI consult, will make NPO after midnight, secure chat acknowledged by Dr Norma Fredrickson, ok to monitor [facility] for now and GI can evaluate tomorrow  Will image for hematoma given recent vascular procedures  Transfusion PRBC Close monitor CBC/HH   R ICA stenosis   Vascular sugery applied stent 04/20/23   See below re: other TIA w/u and neuro recs   TIA, possible seizure-like activity but more concern for limb-shaking TIA Echo no LA enlargement, Afib less likely  R/o acute CVA EEG nonfocal R ICA occlusion on vascular imaging - see above  D/c BuSpar Continue on Lamotrigine  Seizure precautions  Statin Vascular surgery address ICA as above  ok from vascular standpoint to d/c home on ASA/Brilinta, follow in 2 weeks with Dr. Wyn Quaker. Thanks   AKI on CKD3b likely d/t dehydration/contrast - improving toward baseline  then Cr back up again today, concern for hypovolemia  Caution w/ contrast studies Follow BMP   Chest pain - resolved Repeat troponin / EKG as needed   Essential hypertension Holding antihypertensives for now    Dyslipidemia Statin Lipid panel   Anxiety and depression D/c BuSpar and  starting Lamotrigine per neurology  Continue Paxil Low dose Xanax ok prn    Chronic obstructive pulmonary disease (COPD) (HCC) Albuterol prn  Continue home Breo    Normal weight based on BMI: Body mass index is 22.06 kg/m.  Underweight - under 18.5  normal weight - 18.5 to 24.9 overweight - 25 to 29.9 obese - 30 or more   DVT prophylaxis: d/c lovenox, SCD ordered  IV fluids: no continuous IV fluids  Nutrition: regular Central lines / invasive devices: none  Code Status: FULL CODE ACP documentation reviewed: 04/18/23 and none on file in [facility]  TOC needs: none at this time Barriers to dispo / significant pending items: anemia workup/tx as above              Subjective / Brief ROS:  Patient reports very fatigued today Denies dark stool, blood in stool  Denies abd pain or groin pain other than sore from procedure - no worse  Denies hematuria Denies CP/SOB.  Pain controlled.    Family Communication:spoke w/ daughter at bedside this afternoon    Objective Findings:  Vitals:   04/21/23 2241 04/22/23 0536 04/22/23 0732 04/22/23 1303  BP: 125/70 121/61 (!) 152/67 104/68  Pulse: (!) 104 98 99 97  Resp: 16  20 16   Temp: 98.5 F (36.9 C) 98.3 F (36.8 C) 98.1 F (36.7 C) (!) 97.3 F (36.3 C)  TempSrc: Oral Oral  Oral  SpO2: 99% 100% 98%   Weight:      Height:        Intake/Output Summary (Last 24 hours) at 04/22/2023 1325 Last data filed at 04/22/2023 1115 Gross per 24 hour  Intake 120 ml  Output 600 ml  Net -480 ml   Filed Weights   04/18/23 0039 04/20/23 0900 04/20/23 1718  Weight: 62 kg 62 kg 63.2 kg    Examination:  Physical Exam Exam conducted with a chaperone present.  Constitutional:      General: She is not in acute distress. Cardiovascular:     Rate and Rhythm: Normal rate and regular rhythm.  Pulmonary:     Effort: Pulmonary effort is normal.     Breath sounds: Normal breath sounds. Wheezes: scattered, mild.  Abdominal:      General: Bowel sounds are normal.     Palpations: Abdomen is soft.  Genitourinary:    Rectum: Normal. No mass. Normal anal tone.     Comments: Normal appearance stool, hemoccult to lab   Musculoskeletal:     Right lower leg: No edema.     Left lower leg: No edema.  Skin:    General: Skin is warm and dry.     Comments: (+)ecchymoses to R groin are but no active bleeding from cath site   Neurological:     General: No focal deficit present.     Mental Status: She is alert and oriented to person, place, and time. Mental status is at baseline.  Psychiatric:        Mood and Affect: Mood normal.        Behavior: Behavior normal.          Scheduled Medications:   sodium chloride   Intravenous Once  aspirin EC  81 mg Oral QHS   atorvastatin  40 mg Oral QHS   calcium carbonate  300 mg of elemental calcium Oral Q MTWThF   Chlorhexidine Gluconate Cloth  6 each Topical Q0600   cholecalciferol  2,000 Units Oral QHS   fluticasone furoate-vilanterol  1 puff Inhalation Daily   lamoTRIgine  25 mg Oral QHS   melatonin  2.5 mg Oral QHS   multivitamin with minerals  1 tablet Oral Daily   pantoprazole (PROTONIX) IV  40 mg Intravenous Q12H   PARoxetine  40 mg Oral q morning   ticagrelor  90 mg Oral BID    Continuous Infusions:  sodium chloride     magnesium sulfate bolus IVPB      PRN Medications:  sodium chloride, acetaminophen **OR** acetaminophen (TYLENOL) oral liquid 160 mg/5 mL **OR** acetaminophen, acetaminophen **OR** acetaminophen, albuterol, ALPRAZolam, alum & mag hydroxide-simeth, guaiFENesin-dextromethorphan, hydrALAZINE, labetalol, magnesium sulfate bolus IVPB, metoprolol tartrate, ondansetron (ZOFRAN) IV, phenol, potassium chloride, senna-docusate, traZODone  Antimicrobials from admission:  Anti-infectives (From admission, onward)    Start     Dose/Rate Route Frequency Ordered Stop   04/20/23 2200  vancomycin (VANCOCIN) IVPB 1000 mg/200 mL premix        1,000 mg 200  mL/hr over 60 Minutes Intravenous Every 12 hours 04/20/23 1152 04/21/23 1000   04/20/23 0835  vancomycin (VANCOCIN) IVPB 1000 mg/200 mL premix        1,000 mg 200 mL/hr over 60 Minutes Intravenous 60 min pre-op 04/20/23 0835 04/20/23 1026           Data Reviewed:  I have personally reviewed the following...  CBC: Recent Labs  Lab 04/17/23 1843 04/20/23 0438 04/21/23 0342 04/22/23 0513  WBC 5.5 5.9 9.3 7.6  HGB 12.1 12.0 9.3* 7.6*  HCT 36.5 35.7* 29.2* 23.3*  MCV 101.7* 97.8 103.9* 103.1*  PLT 199 200 172 132*   Basic Metabolic Panel: Recent Labs  Lab 04/17/23 1843 04/19/23 0838 04/20/23 0438 04/21/23 0342 04/22/23 0513  NA 136 136 142 136 135  K 4.0 4.3 4.5 4.5 3.9  CL 103 102 106 106 105  CO2 24 26 26 23 23   GLUCOSE 101* 106* 108* 133* 109*  BUN 27* 28* 27* 25* 23  CREATININE 1.53* 1.80* 1.77* 1.48* 1.65*  CALCIUM 9.2 9.4 9.5 8.7* 8.7*   GFR: Estimated Creatinine Clearance: 24.2 mL/min (A) (by C-G formula based on SCr of 1.65 mg/dL (H)). Liver Function Tests: No results for input(s): "AST", "ALT", "ALKPHOS", "BILITOT", "PROT", "ALBUMIN" in the last 168 hours. No results for input(s): "LIPASE", "AMYLASE" in the last 168 hours. No results for input(s): "AMMONIA" in the last 168 hours. Coagulation Profile: No results for input(s): "INR", "PROTIME" in the last 168 hours. Cardiac Enzymes: No results for input(s): "CKTOTAL", "CKMB", "CKMBINDEX", "TROPONINI" in the last 168 hours. BNP (last 3 results) No results for input(s): "PROBNP" in the last 8760 hours. HbA1C: No results for input(s): "HGBA1C" in the last 72 hours.  CBG: Recent Labs  Lab 04/20/23 1722  GLUCAP 110*   Lipid Profile: No results for input(s): "CHOL", "HDL", "LDLCALC", "TRIG", "CHOLHDL", "LDLDIRECT" in the last 72 hours.  Thyroid Function Tests: No results for input(s): "TSH", "T4TOTAL", "FREET4", "T3FREE", "THYROIDAB" in the last 72 hours. Anemia Panel: No results for input(s):  "VITAMINB12", "FOLATE", "FERRITIN", "TIBC", "IRON", "RETICCTPCT" in the last 72 hours. Most Recent Urinalysis On File:     Component Value Date/Time   COLORURINE YELLOW (A) 04/17/2023 2133   APPEARANCEUR CLEAR (  A) 04/17/2023 2133   APPEARANCEUR Clear 01/28/2016 0841   LABSPEC 1.013 04/17/2023 2133   PHURINE 6.0 04/17/2023 2133   GLUCOSEU NEGATIVE 04/17/2023 2133   HGBUR NEGATIVE 04/17/2023 2133   BILIRUBINUR NEGATIVE 04/17/2023 2133   BILIRUBINUR neg 07/29/2019 1541   BILIRUBINUR Negative 01/28/2016 0841   KETONESUR NEGATIVE 04/17/2023 2133   PROTEINUR NEGATIVE 04/17/2023 2133   UROBILINOGEN 0.2 07/29/2019 1541   NITRITE NEGATIVE 04/17/2023 2133   LEUKOCYTESUR NEGATIVE 04/17/2023 2133   Sepsis Labs: @LABRCNTIP (procalcitonin:4,lacticidven:4) Microbiology: Recent Results (from the past 240 hour(s))  MRSA Next Gen by PCR, Nasal     Status: None   Collection Time: 04/20/23  5:24 PM   Specimen: Nasal Mucosa; Nasal Swab  Result Value Ref Range Status   MRSA by PCR Next Gen NOT DETECTED NOT DETECTED Final    Comment: (NOTE) The GeneXpert MRSA Assay (FDA approved for NASAL specimens only), is one component of a comprehensive MRSA colonization surveillance program. It is not intended to diagnose MRSA infection nor to guide or monitor treatment for MRSA infections. Test performance is not FDA approved in patients less than 87 years old. Performed at [facility], [address], [place], [place] [address]       Radiology Studies last 3 days: CT CHEST ABDOMEN PELVIS WO CONTRAST  Result Date: 04/22/2023 CLINICAL DATA:  82 year old female with history of abrupt drop in hematocrit/hemoglobin. Evaluate for retroperitoneal bleeding. History of non-small cell lung cancer. * Tracking Code: BO * EXAM: CT CHEST, ABDOMEN AND PELVIS WITHOUT CONTRAST TECHNIQUE: Multidetector CT imaging of the chest, abdomen and pelvis was performed following the standard protocol without IV contrast.  RADIATION DOSE REDUCTION: This exam was performed according to the departmental dose-optimization program which includes automated exposure control, adjustment of the mA and/or kV according to patient size and/or use of iterative reconstruction technique. COMPARISON:  Chest CT 01/19/2022. PET-CT 01/31/2022. CT of the pelvis 10/31/2022. FINDINGS: CT CHEST FINDINGS Cardiovascular: Heart size is normal. There is no significant pericardial fluid, thickening or pericardial calcification. There is aortic atherosclerosis, as well as atherosclerosis of the great vessels of the mediastinum and the coronary arteries, including calcified atherosclerotic plaque in the left main, left anterior descending, left circumflex and right coronary arteries. Calcifications of the aortic valve. Stent in the innominate artery. Mediastinum/Nodes: No pathologically enlarged mediastinal or hilar lymph nodes. Please note that accurate exclusion of hilar adenopathy is limited on noncontrast CT scans. Esophagus is unremarkable in appearance. No axillary lymphadenopathy. 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 Trace right pleural effusion lying dependently. Diffuse bronchial wall thickening with moderate centrilobular and paraseptal emphysema. No definite suspicious appearing pulmonary nodules or masses are noted. Musculoskeletal: Chronic compression fractures of T7 and T9, similar to prior studies, most severe at T9 where there is up to 70% loss of central vertebral body height. There are no aggressive appearing lytic or blastic lesions noted in the visualized portions of the skeleton. CT ABDOMEN PELVIS FINDINGS Hepatobiliary: No definite suspicious appearing cystic or solid hepatic lesions are confidently identified on today's noncontrast CT examination. High attenuation lying dependently in  the gallbladder, presumably biliary sludge. Pancreas: No definite pancreatic mass or peripancreatic fluid collections or inflammatory changes are noted on today's noncontrast CT examination. Spleen: Unremarkable. Adrenals/Urinary Tract: Multifocal cortical scarring in the kidneys bilaterally. Calcifications associated with both renal hila, favored to be vascular (nonobstructive calculi are not  entirely excluded. No definite suspicious renal lesions. No hydroureteronephrosis. Urinary bladder is unremarkable in appearance. Bilateral adrenal glands are normal in appearance. Stomach/Bowel: The appearance of the stomach is normal. No pathologic dilatation of small bowel or colon. Numerous colonic diverticula are noted, without surrounding inflammatory changes to suggest an acute diverticulitis at this time. Thickening of the colonic wall in the region of the sigmoid colon, potentially chronic related to underlying diverticular disease (without focal surrounding inflammatory changes). Normal appendix. Vascular/Lymphatic: Atherosclerosis in the abdominal aorta and pelvic vasculature. 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 No lymphadenopathy noted in the abdomen or pelvis. Reproductive: Uterus and ovaries are atrophic and otherwise unremarkable in appearance. Other: No high attenuation fluid collection in the peritoneal cavity or retroperitoneum to suggest significant posttraumatic hemorrhage. No significant volume of ascites. No pneumoperitoneum. Musculoskeletal: Chronic compression fracture of superior endplate of L1 with 20% loss of anterior vertebral body height. Old healed fractures of the inferior pubic rami bilaterally. Status post ORIF in the left proximal femur partially imaged. There are no aggressive appearing lytic or blastic lesions noted in the visualized portions of the skeleton. IMPRESSION: 1. No unexpected peritoneal or retroperitoneal hematoma. 2.  Colonic diverticulosis without evidence of acute diverticulitis at this time. There is also some extensive mural thickening in the sigmoid colon which may be simply related to chronic diverticular disease. No discrete colonic mass confidently identified. 3. Biliary sludge in the gallbladder. No findings to suggest an acute cholecystitis at this time. 4. Chronic postradiation changes in the right lung, similar to prior studies. No definitive findings to suggest locally recurrent disease or metastatic disease in the chest, abdomen or pelvis. 5. Trace right pleural effusion. 6. Aortic atherosclerosis, in addition to left main and three-vessel coronary artery disease. 7. There are calcifications of the aortic valve. Echocardiographic correlation for evaluation of potential valvular dysfunction may be warranted if clinically indicated. 8. Additional incidental findings, as above. Electronically Signed   By: Trudie Reed M.D.   On: 04/22/2023 11:29   PERIPHERAL VASCULAR CATHETERIZATION  Result Date: 04/20/2023 See surgical note for result.  EEG adult  Result Date: 04/18/2023 Charlsie Quest, MD     04/18/2023  8:52 PM Patient Name: Christine Burnett MRN: [id number] Epilepsy Attending: Charlsie Quest Referring Physician/Provider: Gordy Councilman, MD Date: 04/18/2023 Duration: 26.06 mins Patient history: 82 yo F with rhythmic shaking of the left lower extremity with classic description of a jacksonian march. EEG to evaluate for seizure Level of alertness: Awake, asleep AEDs during EEG study: None Technical aspects: This EEG study was done with scalp electrodes positioned according to the 10-20 International system of electrode placement. Electrical activity was reviewed with band pass filter of 1-70Hz , sensitivity of 7 uV/mm, display speed of 49mm/sec with a 60Hz  notched filter applied as appropriate. EEG data were recorded continuously and digitally stored.  Video monitoring was available and reviewed  as appropriate. Description: The posterior dominant rhythm consists of 8 Hz activity of moderate voltage (25-35 uV) seen predominantly in posterior head regions, symmetric and reactive to eye opening and eye closing. Sleep was characterized by vertex waves, sleep spindles (12 to 14 Hz), maximal frontocentral region. EEG showed intermittent generalized 3 to 6 Hz theta-delta slowing. Hyperventilation and photic stimulation were not performed.   ABNORMALITY - Intermittent slow, generalized IMPRESSION: This study is suggestive of mild diffuse encephalopathy. No seizures or epileptiform discharges were seen throughout the recording. Priyanka Annabelle Harman  CT ANGIO HEAD NECK W WO CM  Result Date: 04/18/2023 CLINICAL DATA:  Abnormality in the basilar artery on MRA, possible acute infarcts on MRI EXAM: CT ANGIOGRAPHY HEAD AND NECK WITH AND WITHOUT CONTRAST TECHNIQUE: Multidetector CT imaging of the head and neck was performed using the standard protocol during bolus administration of intravenous contrast. Multiplanar CT image reconstructions and MIPs were obtained to evaluate the vascular anatomy. Carotid stenosis measurements (when applicable) are obtained utilizing NASCET criteria, using the distal internal carotid diameter as the denominator. RADIATION DOSE REDUCTION: This exam was performed according to the departmental dose-optimization program which includes automated exposure control, adjustment of the mA and/or kV according to patient size and/or use of iterative reconstruction technique. CONTRAST:  75mL OMNIPAQUE IOHEXOL 350 MG/ML SOLN COMPARISON:  07/03/2014 CTA and, correlation is made with 04/18/2023 MRI head and MRA head and 04/17/2023 CT head FINDINGS: CT HEAD FINDINGS Brain: The infarcts noted on the recent MRI are not apparent on CT. No evidence of new acute infarct, hemorrhage, mass, mass effect, or midline shift. No hydrocephalus or extra-axial fluid collection. Periventricular white matter changes,  likely the sequela of chronic small vessel ischemic disease. Vascular: No hyperdense vessel. Atherosclerotic calcifications in the intracranial carotid and vertebral arteries. Skull: Negative for fracture or focal lesion. Sinuses/Orbits: Mucosal thickening in the ethmoid air cells. No acute finding in the orbits. Other: The mastoid air cells are well aerated. CTA NECK FINDINGS Aortic arch: Standard branching. Imaged portion shows no evidence of aneurysm or dissection. Evaluation of the arch vessel origins is limited by beam hardening artifact from the adjacent contrast bolus, but there is at least 50% stenosis at each of the vessel origins. Right carotid system: No evidence of dissection, occlusion, or hemodynamically significant stenosis (greater than 50%). Atherosclerotic disease at the bifurcation and in the proximal and distal ICA is not hemodynamically significant. Comparatively poor opacification of the right ICA compared to the left may be related to the degree of stenosis at the origin of the brachiocephalic artery. Left carotid system: No evidence of dissection, occlusion, or hemodynamically significant stenosis (greater than 50%). Atherosclerotic disease at the bifurcation and in the proximal and distal ICA is not hemodynamically significant. Vertebral arteries: Poor visualization of the origin of the right vertebral artery secondary to the adjacent contrast bolus. Within that limitation, there is likely severe stenosis at the origin of the right vertebral artery, which is very poorly opacified until the V3 segment (series 10, image 27). Moderate stenosis at the origin of left vertebral artery, which is non dominant. The left vertebral artery is patent through the skull base. No evidence of dissection. Skeleton: No acute osseous abnormality. Degenerative changes in the cervical spine. Other neck: No acute finding. Upper chest: Emphysema. No focal pulmonary opacity or pleural effusion. Review of the MIP  images confirms the above findings CTA HEAD FINDINGS Anterior circulation: Both internal carotid arteries are patent to the termini, with calcifications but without significant stenosis, although the right ICA is poorly opacified. 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 A1 segments patent. Normal anterior communicating artery. Anterior cerebral arteries are patent to their distal aspects without significant stenosis. No M1 stenosis or occlusion. MCA branches perfused to their distal aspects without significant stenosis. Opacification of the right ACA and MCA is improved compared to the right ICA, likely reflecting collateral flow from  the right posterior communicating artery. Posterior circulation: Moderate to severe stenosis in the proximal left V4 (series 10, images 155, 152, and 145). The left vertebral artery primarily supplies the left PICA, which is patent. Mild stenosis in the proximal right V4, which is otherwise patent to the vertebrobasilar junction. The right PICA is also patent. Given poor opacification of the extracranial V4, this is likely retrograde. Basilar patent to its distal aspect without significant stenosis. Superior cerebellar arteries patent proximally. Patent P1 segments. The bilateral posterior communicating arteries are patent. PCAs perfused to their distal aspects without significant stenosis. Venous sinuses: As permitted by contrast timing, patent. Anatomic variants: None significant. No evidence of aneurysm or vascular malformation. Review of the MIP images confirms the above findings IMPRESSION: 1. The infarcts noted on the recent MRI are not apparent on CT. No evidence of new acute infarct. 2. No intracranial large vessel occlusion. 3. There is likely severe stenosis at the origin of the right vertebral artery, which is  poorly opacified until the V4 segment, where there is likely retrograde opacification. 4. Poor opacification of the right ICA, which may be related to stenosis at the origin of the brachiocephalic artery. Improved opacification of the right ACA and MCA is likely collateral flow from the posterior communicating artery. 5. Moderate stenosis at the origin of the left vertebral artery, with additional moderate to severe stenosis in the proximal left V4. 6. 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 7. Evaluation of the arch vessel origins is limited by beam hardening artifact from the adjacent contrast bolus, but there is likely at least 50% stenosis at each of the vessel origins. \ Electronically Signed   By: Wiliam Ke M.D.   On: 04/18/2023 19:41   MR ANGIO HEAD WO CONTRAST  Result Date: 04/18/2023 CLINICAL DATA:  Neuro deficit, acute, stroke suspected EXAM: MRA HEAD WITHOUT CONTRAST TECHNIQUE: Angiographic images of the Circle of Willis were acquired using MRA technique without intravenous contrast. 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 Normal flow void was visualized on same day brain MRI. There is reconstitution of the level of the ICA terminus via collaterals from the circle-of-Willis. There is normal arborization in the bilateral MCA territories. Posterior circulation: There is nearly absent flow void in the basilar artery with robust PCOM bilaterally. P2 and P3 segments demonstrate normal flow signal. Anatomic variants: Fetal PCAs bilaterally. Other: None. IMPRESSION: 1. Asymmetrically decreased flow void in the right petrous, cavernous, and supraclinoid ICA is worrisome for interval development of a new right ICA  occlusion. There is reconstitution of the level of the ICA terminus via collaterals from the circle-of-Willis. Recommend further evaluation with a CT head and neck angiogram 2. Nearly absent flow void in the basilar artery with robust PCOM bilaterally. 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 Recommend attention on CTA. These results will be called to the ordering clinician or representative by the Radiologist Assistant, and communication documented in the PACS or [employer]. Electronically Signed   By: Lorenza Cambridge M.D.   On: 04/18/2023 14:48       Time spent: 50 min    Sunnie Nielsen, DO [facility] 04/22/2023, 1:25 PM    Dictation software may have been used to generate  the above note. Typos may occur and escape review in typed/dictated notes. Please contact Dr Lyn Hollingshead directly for clarity if needed.  Staff may message me via secure chat in Epic  but this may not receive an immediate response,  please page me for urgent matters!  If 7PM-7AM, please contact night coverage [email or web address]

## 2023-04-22 NOTE — Progress Notes (Addendum)
Physical Therapy Treatment Patient Details Name: Christine Burnett MRN: [id number] DOB: [birth year] Today's Date: 04/22/2023   History of Present Illness 82 y.o. female with PMHx of stage III CKD, COPD, anxiety, osteoarthritis, dyslipidemia, CVA and TIA, who presented to ED with acute onset of LLE weakness feeling leg was heavy and dragging it when walking- improved within 15-20 minutes, then reports LLE shakiness. Also noted heaviness over L side of her head that is now resolved. MRI findings: Question punctate foci of restricted diffusion involving the mid  splenium and within the right cerebellar hemisphere, possible tiny acute  ischemic infarcts are difficult to exclude and Underlying age-related cerebral atrophy with advanced chronic microvascular ischemic disease, with small remote bilateral  cerebellar infarcts, L >R.    PT Comments  Upon passing room, noted safety alarm going off.  Pt sitting EOB on L side with rails up and blood transfusion running.  She stated she needed to use the bathroom.  Assisted pt back to bed and bed pan obtained.  She is inc of urine in transitions so linens and gown changed.  She is able to voice the ability to use call ball "I press the [employer] cross" and is re-educated on need to call for assist.  Voices understanding.  Left with needs in reach and alarm on.  Further interventions deferred due to blood transfusion.   If plan is discharge home, recommend the following: Assist for transportation;Assistance with cooking/housework;Help with stairs or ramp for entrance   Can travel by private vehicle        Equipment Recommendations  None recommended by PT    Recommendations for Other Services       Precautions / Restrictions Precautions Precautions: Fall Restrictions Weight Bearing Restrictions: No     Mobility  Bed Mobility Overal bed mobility: Modified Independent               Patient Response: Cooperative, Anxious  Transfers                         Ambulation/Gait                   Stairs             Wheelchair Mobility     Tilt Bed Tilt Bed Patient Response: Cooperative, Anxious  Modified Rankin (Stroke Patients Only)       Balance                                            Cognition Arousal: Alert Behavior During Therapy: WFL for tasks assessed/performed Overall Cognitive Status: Difficult to assess                                 General Comments: climbing out of bed to void with blood transfusion running        Exercises      General Comments        Pertinent Vitals/Pain Pain Assessment Pain Assessment: No/denies pain    Home Living                          Prior Function            PT Goals (current goals can now be found in  the care plan section) Progress towards PT goals: Progressing toward goals    Frequency    Min 1X/week      PT Plan      Co-evaluation              AM-PAC PT "6 Clicks" Mobility   Outcome Measure  Help needed turning from your back to your side while in a flat bed without using bedrails?: None Help needed moving from lying on your back to sitting on the side of a flat bed without using bedrails?: None Help needed moving to and from a bed to a chair (including a wheelchair)?: None Help needed standing up from a chair using your arms (e.g., wheelchair or bedside chair)?: None Help needed to walk in hospital room?: A Little Help needed climbing 3-5 steps with a railing? : A Little 6 Click Score: 22    End of Session   Activity Tolerance: Patient tolerated treatment well Patient left: with call bell/phone within reach;in bed;with bed alarm set Nurse Communication: Mobility status PT Visit Diagnosis: Unsteadiness on feet (R26.81);Dizziness and giddiness (R42)     Time: 1610-9604 PT Time Calculation (min) (ACUTE ONLY): 14 min  Charges:    $Therapeutic Activity: 8-22 mins PT  General Charges $$ ACUTE PT VISIT: 1 Visit                   Danielle Dess, PTA 04/22/23, 3:44 PM

## 2023-04-22 NOTE — Plan of Care (Signed)
  Problem: Education: Goal: Knowledge of disease or condition will improve Outcome: Progressing Goal: Knowledge of secondary prevention will improve (MUST DOCUMENT ALL) Outcome: Progressing Goal: Knowledge of patient specific risk factors will improve Christine Burnett N/A or DELETE if not current risk factor) Outcome: Progressing   Problem: Ischemic Stroke/TIA Tissue Perfusion: Goal: Complications of ischemic stroke/TIA will be minimized Outcome: Progressing   Problem: Nutrition: Goal: Risk of aspiration will decrease Outcome: Progressing Goal: Dietary intake will improve Outcome: Progressing   Problem: Activity: Goal: Risk for activity intolerance will decrease Outcome: Progressing   Problem: Nutrition: Goal: Adequate nutrition will be maintained Outcome: Progressing   Problem: Safety: Goal: Ability to remain free from injury will improve Outcome: Progressing   Problem: Skin Integrity: Goal: Risk for impaired skin integrity will decrease Outcome: Progressing

## 2023-04-22 NOTE — Plan of Care (Signed)
  Problem: Education: Goal: Knowledge of disease or condition will improve Outcome: Progressing Goal: Knowledge of secondary prevention will improve (MUST DOCUMENT ALL) Outcome: Progressing Goal: Knowledge of patient specific risk factors will improve Loraine Leriche N/A or DELETE if not current risk factor) Outcome: Progressing   Problem: Ischemic Stroke/TIA Tissue Perfusion: Goal: Complications of ischemic stroke/TIA will be minimized Outcome: Progressing   Problem: Coping: Goal: Will verbalize positive feelings about self Outcome: Progressing Goal: Will identify appropriate support needs Outcome: Progressing   Problem: Health Behavior/Discharge Planning: Goal: Ability to manage health-related needs will improve Outcome: Progressing Goal: Goals will be collaboratively established with patient/family Outcome: Progressing   Problem: Self-Care: Goal: Ability to participate in self-care as condition permits will improve Outcome: Progressing Goal: Verbalization of feelings and concerns over difficulty with self-care will improve Outcome: Progressing Goal: Ability to communicate needs accurately will improve Outcome: Progressing   Problem: Nutrition: Goal: Risk of aspiration will decrease Outcome: Progressing Goal: Dietary intake will improve Outcome: Progressing   Problem: Education: Goal: Knowledge of General Education information will improve Description: Including pain rating scale, medication(s)/side effects and non-pharmacologic comfort measures Outcome: Progressing   Problem: Health Behavior/Discharge Planning: Goal: Ability to manage health-related needs will improve Outcome: Progressing   Problem: Clinical Measurements: Goal: Ability to maintain clinical measurements within normal limits will improve Outcome: Progressing Goal: Will remain free from infection Outcome: Progressing Goal: Diagnostic test results will improve Outcome: Progressing Goal: Respiratory  complications will improve Outcome: Progressing Goal: Cardiovascular complication will be avoided Outcome: Progressing   Problem: Activity: Goal: Risk for activity intolerance will decrease Outcome: Progressing   Problem: Nutrition: Goal: Adequate nutrition will be maintained Outcome: Progressing   Problem: Coping: Goal: Level of anxiety will decrease Outcome: Progressing   Problem: Elimination: Goal: Will not experience complications related to bowel motility Outcome: Progressing Goal: Will not experience complications related to urinary retention Outcome: Progressing   Problem: Pain Management: Goal: General experience of comfort will improve Outcome: Progressing   Problem: Safety: Goal: Ability to remain free from injury will improve Outcome: Progressing   Problem: Skin Integrity: Goal: Risk for impaired skin integrity will decrease Outcome: Progressing   Problem: Education: Goal: Knowledge of discharge needs will improve Outcome: Progressing   Problem: Clinical Measurements: Goal: Postoperative complications will be avoided or minimized Outcome: Progressing   Problem: Respiratory: Goal: Ability to achieve and maintain a regular respiratory rate will improve Outcome: Progressing   Problem: Skin Integrity: Goal: Demonstration of wound healing without infection will improve Outcome: Progressing

## 2023-04-22 NOTE — Progress Notes (Signed)
PT Cancellation Note  Patient Details Name: Christine Burnett MRN: [id number] DOB: [birth year]   Cancelled Treatment:    Reason Eval/Treat Not Completed: Medical issues which prohibited therapy  Pt receiving blood.  Will defer PT and continue at a later date per protocol   Danielle Dess 04/22/2023, 2:02 PM

## 2023-04-23 DIAGNOSIS — I6521 Occlusion and stenosis of right carotid artery: Secondary | ICD-10-CM | POA: Diagnosis not present

## 2023-04-23 LAB — TYPE AND SCREEN
ABO/RH(D): O POS
Antibody Screen: NEGATIVE
Unit division: 0

## 2023-04-23 LAB — CBC
HCT: 26.1 % — ABNORMAL LOW (ref 36.0–46.0)
Hemoglobin: 8.8 g/dL — ABNORMAL LOW (ref 12.0–15.0)
MCH: 33 pg (ref 26.0–34.0)
MCHC: 33.7 g/dL (ref 30.0–36.0)
MCV: 97.8 fL (ref 80.0–100.0)
Platelets: 147 10*3/uL — ABNORMAL LOW (ref 150–400)
RBC: 2.67 MIL/uL — ABNORMAL LOW (ref 3.87–5.11)
RDW: 15.6 % — ABNORMAL HIGH (ref 11.5–15.5)
WBC: 8 10*3/uL (ref 4.0–10.5)
nRBC: 0 % (ref 0.0–0.2)

## 2023-04-23 LAB — BASIC METABOLIC PANEL
Anion gap: 8 (ref 5–15)
BUN: 21 mg/dL (ref 8–23)
CO2: 24 mmol/L (ref 22–32)
Calcium: 8.7 mg/dL — ABNORMAL LOW (ref 8.9–10.3)
Chloride: 106 mmol/L (ref 98–111)
Creatinine, Ser: 1.48 mg/dL — ABNORMAL HIGH (ref 0.44–1.00)
GFR, Estimated: 35 mL/min — ABNORMAL LOW (ref >60)
Glucose, Bld: 96 mg/dL (ref 70–99)
Potassium: 3.6 mmol/L (ref 3.5–5.1)
Sodium: 138 mmol/L (ref 135–145)

## 2023-04-23 LAB — BPAM RBC
Blood Product Expiration Date: 202412142359
ISSUE DATE / TIME: 202411171254
Unit Type and Rh: 5100

## 2023-04-23 MED ORDER — SENNOSIDES-DOCUSATE SODIUM 8.6-50 MG PO TABS: 1.0000 | ORAL_TABLET | Freq: Every evening | ORAL | 1 refills | Status: DC | PRN

## 2023-04-23 MED ORDER — TICAGRELOR 90 MG PO TABS: 90.0000 mg | ORAL_TABLET | Freq: Two times a day (BID) | ORAL | 1 refills | Status: DC

## 2023-04-23 MED ORDER — FERROUS SULFATE 325 (65 FE) MG PO TBEC: 325.0000 mg | DELAYED_RELEASE_TABLET | ORAL | 0 refills | Status: DC

## 2023-04-23 NOTE — Consult Note (Signed)
GI Inpatient Consult Note  Reason for Consult: Anemia, GI bleed   Attending Requesting Consult: Dr. Sunnie Nielsen, DO  History of Present Illness: Christine Burnett is a 82 y.o. female seen for evaluation of anemia and possible GI bleed at the request of hospitalist - Dr. Sunnie Nielsen. 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 She presented to the [facility] ED from home 11/12 for chief complaint of left leg weakness. She was admitted to hospitalist service for TIA work-up where MRI brain was concerning for possibly tiny acute ischemic infarcts and chronic hemorrhage in anterior frontal lobe. Neurology saw patient and said MRI findings favored to be artifact. Vascular saw patient and she is s/p angio with stent to right innominate artery. She was started on DAPT with ASA and Brilinta. GI consulted yesterday for concerns of significant anemia. Hemoglobin was 12.0 on 11/15, dropped to 9.3 on 11/16, and trough 7.6 yesterday morning. No overt GI bleeding. CT chest/abd/pelvis was negative for retroperitoneal bleed or other obvious sources of bleeding. She was transfused 1 unit pRBC. Hemoccult card sent to the lab was negative. GI consulted for concerns of GI bleeding.   Patient seen and examined in hospital room this morning. Her daughter, [name], is present in room. There were reports of confusion overnight with patient being disoriented to place time, and situation. This improved. This morning she is able to tell me her name, current location, time, and her daughter's name. She denies any overt gastrointestinal bleeding. Hemoglobin was 9.1 after the 1 unit transfusion and was 8.8 this morning. She had a small bowel movement yesterday. She denies any abdominal pain or abdominal cramping. No significant change in her bowel habits other than less frequent  since being in the hospital. No overt hematochezia or melena. She denies any hx of GI bleeding. She reports she had a colonoscopy ~5 years ago and was negative per her report. She denies any unintentional weight loss. She offers no other complaints at this time.    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   Managed by nephrologist   Non-small cell lung cancer (NSCLC) (HCC)    Osteoporosis    Oxygen deficiency Me   Risk for falls    Situational disturbance    Stroke (HCC) 2011   tia's x 2   TIA (transient ischemic attack)     Problem List: Patient Active Problem List   Diagnosis Date Noted   Stenosis of right internal carotid artery 04/19/2023   Internal carotid artery stenosis, right 04/19/2023   Essential hypertension 04/18/2023   Dyslipidemia 04/18/2023   Anxiety and depression 04/18/2023   Chronic obstructive pulmonary disease (COPD) (HCC) 04/18/2023   TIA (transient ischemic attack) 04/17/2023   Inferior pubic ramus fracture, left, closed, initial  encounter (HCC) 11/01/2022   Fall 11/01/2022   Closed fracture of left inferior pubic ramus, initial encounter (HCC) 11/01/2022   Sacral fracture, closed (HCC) 11/01/2022   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   Dyspnea on exertion 05/15/2022   Coronary artery disease    COPD (chronic obstructive pulmonary disease) (HCC) 01/28/2021   CKD (chronic kidney disease) stage 4, GFR 15-29 ml/min (HCC) 01/21/2021    Goals of care, counseling/discussion 07/17/2020   Malignant neoplasm of lower lobe of right lung (HCC) 07/04/2020   Nocturnal hypoxia 03/17/2020   Prediabetes 08/13/2018   Chest pain 09/27/2017   Allergic rhinitis 09/27/2017   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   Stage 3b chronic kidney disease (HCC) 08/31/2017   Hyperparathyroidism, 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   Osteoporosis without current pathological fracture 03/30/2017   Cardiac murmur 04/07/2015   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 stage 4 chronic kidney disease, or unspecified chronic kidney disease 02/10/2015   Anxiety 01/05/2015   Hyperlipidemia 01/05/2015   HTN (hypertension) 09/24/2014    Past Surgical History: Past Surgical History:  Procedure Laterality Date   COLONOSCOPY  2016   ESOPHAGUS SURGERY     INTRAMEDULLARY (IM) NAIL INTERTROCHANTERIC Left 03/26/2017   Procedure: INTRAMEDULLARY (IM) NAIL INTERTROCHANTRIC;  Surgeon: Kennedy Bucker, MD;  Location: [facility] ORS;  Service: Orthopedics;  Laterality: Left;   LEFT HEART CATH AND CORONARY ANGIOGRAPHY N/A 04/15/2021   Procedure: LEFT HEART CATH AND CORONARY ANGIOGRAPHY;  Surgeon: Iran Ouch, MD;  Location: [facility] INVASIVE CV LAB;  Service: Cardiovascular;  Laterality: N/A;   RIGHT/LEFT HEART CATH AND CORONARY ANGIOGRAPHY Bilateral 05/15/2022   Procedure: RIGHT/LEFT HEART CATH AND CORONARY ANGIOGRAPHY;  Surgeon: Iran Ouch, MD;  Location: [facility] INVASIVE CV LAB;  Service: Cardiovascular;  Laterality: Bilateral;   VIDEO BRONCHOSCOPY WITH ENDOBRONCHIAL NAVIGATION Right 06/28/2020   Procedure: VIDEO BRONCHOSCOPY WITH ENDOBRONCHIAL NAVIGATION CELLVIZIO;  Surgeon: Salena Saner, MD;  Location: [facility] ORS;  Service: Pulmonary;  Laterality: Right;    Allergies: Allergies  Allergen Reactions   Zithromax [Azithromycin]    Cefuroxime Axetil Rash   Ciprofloxacin Nausea Only    Unknown   Codeine Other (See Comments)    Patient does not like  how the medication makes you feel     Home Medications: Medications Prior to Admission  Medication Sig Dispense Refill Last Dose   acetaminophen (TYLENOL) 650 MG CR tablet Take 1 tablet (650 mg total) by mouth 4 (four) times daily as needed for pain.   prn at prn   albuterol (VENTOLIN HFA) 108 (90 Base) MCG/ACT inhaler Inhale 2 puffs into the lungs every 4 (four) hours as needed for wheezing or shortness of breath. 8 g 1 prn at prn   aspirin 81 MG EC tablet Take 81 mg by mouth at bedtime. Swallow whole.   Past Week   atorvastatin (LIPITOR) 40 MG tablet Take 1 tablet (40 mg total) by mouth at bedtime. 90 tablet 1 Past Week   busPIRone (BUSPAR) 5 MG tablet Take 1 tablet (5 mg total) by mouth 2 (two) times daily as needed (stress anxiety). 60 tablet 1 prn at prn   Calcium 200 MG TABS Take 300 mg by mouth every Monday, Tuesday, Wednesday, Thursday, and Friday.   Past Week   Cholecalciferol (VITAMIN D3) 50 MCG (2000 UT) TABS Take 2,000 Units by mouth at bedtime.   Past Week   fluticasone furoate-vilanterol (BREO ELLIPTA) 100-25 MCG/ACT AEPB Inhale 1 puff into the lungs daily. 1  each 11 Past Week   melatonin 3 MG TABS tablet Take 3 mg by mouth at bedtime.   Past Week   Multiple Vitamin (MULTIVITAMIN) capsule Take 1 capsule by mouth daily.   Past Week   PARoxetine (PAXIL) 40 MG tablet Take 1 tablet (40 mg total) by mouth every morning. 90 tablet 2 Past Week   Home medication reconciliation was completed with the patient.   Scheduled Inpatient Medications:    aspirin EC  81 mg Oral QHS   atorvastatin  40 mg Oral QHS   calcium carbonate  300 mg of elemental calcium Oral Q [id number]   Chlorhexidine Gluconate Cloth  6 each Topical Q0600   cholecalciferol  2,000 Units Oral QHS   fluticasone furoate-vilanterol  1 puff Inhalation Daily   lamoTRIgine  25 mg Oral QHS   melatonin  2.5 mg Oral QHS   multivitamin with minerals  1 tablet Oral Daily   pantoprazole (PROTONIX) IV  40 mg Intravenous Q12H    PARoxetine  40 mg Oral q morning   ticagrelor  90 mg Oral BID    Continuous Inpatient Infusions:    sodium chloride     magnesium sulfate bolus IVPB      PRN Inpatient Medications:  sodium chloride, acetaminophen **OR** acetaminophen (TYLENOL) oral liquid 160 mg/5 mL **OR** acetaminophen, acetaminophen **OR** acetaminophen, albuterol, ALPRAZolam, alum & mag hydroxide-simeth, guaiFENesin-dextromethorphan, hydrALAZINE, labetalol, magnesium sulfate bolus IVPB, metoprolol tartrate, ondansetron (ZOFRAN) IV, phenol, potassium chloride, senna-docusate, traZODone  Family History: family history includes Cancer in her father; Diabetes in her brother; Heart attack in her brother and father; Heart disease in her father; Hypertension in her daughter; Kidney disease in her mother; Other in her mother; Prostate cancer in her father.  The patient's family history is negative for inflammatory bowel disorders, GI malignancy, or solid organ transplantation.  Social History:   reports that she quit smoking about 6 years ago. Her smoking use included cigarettes. She started smoking about 67 years ago. She has a 61 pack-year smoking history. She has never used smokeless tobacco. She reports that she does not drink alcohol and does not use drugs. The patient denies ETOH, tobacco, or drug use.   Review of Systems: Constitutional: Weight is stable.  Eyes: No changes in vision. ENT: No oral lesions, sore throat.  GI: see HPI.  Heme/Lymph: No easy bruising.  CV: No chest pain.  GU: No hematuria.  Integumentary: No rashes.  Neuro: No headaches.  Psych: No depression/anxiety.  Endocrine: No heat/cold intolerance.  Allergic/Immunologic: No urticaria.  Resp: No cough, SOB.  Musculoskeletal: No joint swelling.    Physical Examination: BP 100/69 (BP Location: Left Arm)   Pulse 92   Temp 98.1 F (36.7 C)   Resp 16   Ht 5\' 6"  (1.676 m)   Wt 63.2 kg   SpO2 96%   BMI 22.49 kg/m  Gen: NAD, alert and  oriented x 4 HEENT: PEERLA, EOMI, Neck: supple, no JVD or thyromegaly Chest: CTA bilaterally, no wheezes, crackles, or other adventitious sounds CV: RRR, no m/g/c/r Abd: soft, NT, ND, +BS in all four quadrants; no HSM, guarding, ridigity, or rebound tenderness Ext: no edema, well perfused with 2+ pulses, Skin: no rash or lesions noted Lymph: no LAD  Data: Lab Results  Component Value Date   WBC 8.0 04/23/2023   HGB 8.8 (L) 04/23/2023   HCT 26.1 (L) 04/23/2023   MCV 97.8 04/23/2023   PLT 147 (L) 04/23/2023   Recent Labs  Lab 04/22/23  1308 04/22/23 1832 04/23/23 0452  HGB 7.6* 9.1* 8.8*   Lab Results  Component Value Date   NA 138 04/23/2023   K 3.6 04/23/2023   CL 106 04/23/2023   CO2 24 04/23/2023   BUN 21 04/23/2023   CREATININE 1.48 (H) 04/23/2023   Lab Results  Component Value Date   ALT 16 11/01/2022   AST 23 11/01/2022   ALKPHOS 93 11/01/2022   BILITOT 1.0 11/01/2022   No results for input(s): "APTT", "INR", "PTT" in the last 168 hours.  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 GI consulted due to drop in hemoglobin from 12.0--9.3--7.6 concerning for GI bleed.   Anemia - no signs of overt GI bleeding. Hemoglobin improved to 8.8 after 1 unit transfusion. CT scan negative for acute bleeding. Hemoccult negative.  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 Transfuse for  Hgb <7.0.  - No overt GI bleeding at this time.  - Continue to monitor for signs of GI bleeding - Defer any luminal evaluation at this time given recent stent and need for uninterrupted DAPT. I would NOT advise holding DAPT at this time as benefits do not outweigh risks in a fresh sent.  - Continue DAPT with close observation for signs of bleeding - ADAT - GI will sign off at this time and follow peripherally - Patient and daughter are in agreement with plan of care  Thank you for the consult. Please call with questions or concerns.  Gilda Crease, PA-C [facility] [phone]

## 2023-04-23 NOTE — Progress Notes (Signed)
Physical Therapy Treatment Patient Details Name: Christine Burnett MRN: [id number] DOB: 03-30-1941 Today's Date: 04/23/2023   History of Present Illness 82 y.o. female with PMHx of stage III CKD, COPD, anxiety, osteoarthritis, dyslipidemia, CVA and TIA, who presented to ED with acute onset of LLE weakness feeling leg was heavy and dragging it when walking- improved within 15-20 minutes, then reports LLE shakiness. Also noted heaviness over L side of her head that is now resolved. MRI findings: Question punctate foci of restricted diffusion involving the mid  splenium and within the right cerebellar hemisphere, possible tiny acute  ischemic infarcts are difficult to exclude and Underlying age-related cerebral atrophy with advanced chronic microvascular ischemic disease, with small remote bilateral  cerebellar infarcts, L >R.    PT Comments  Pt initially hesitant to do a lot with PT, but with encouragement from daughter and this PT she did genuinely show good effort.  She struggled with intermittent (and varying levels of) dizziness she had soft BPs but dizziness did not seem to follow typical orthostatic patterns.  She did not present with focal weakness or coordination issues but was weaker and less coordinated in general as per our prior session where she was able to easily rise, maintain standing confidently with minimal UE use and easily circumambulated the nurses' station multiple times.  Pt's daughter present t/o the session and does understand that pt will need some assistance should she return to home.  Pt will benefit from continued PT to address functional limitations and promote safe discharge.   If plan is discharge home, recommend the following: Assist for transportation;Assistance with cooking/housework;Help with stairs or ramp for entrance   Can travel by private vehicle        Equipment Recommendations  None recommended by PT    Recommendations for Other Services        Precautions / Restrictions Precautions Precautions: Fall Restrictions Weight Bearing Restrictions: No     Mobility  Bed Mobility Overal bed mobility: Modified Independent             General bed mobility comments: Pt was able to get herself to EOB w/o assist, definite need of rails - did endorse some dizziness on sitting - BP 98/55 after a few minutes of sitting, and then another few minutes later 125/69 after subsiding severity of dizziness that intermittently goes up/down    Transfers Overall transfer level: Modified independent Equipment used: Rolling walker (2 wheels) Transfers: Sit to/from Stand Sit to Stand: Supervision           General transfer comment: Able to rise to standing without assist but struggled to get fully upright (knees staying bent, forward flexed trunk).  Clearly more reliant on the walker today than prior PT session    Ambulation/Gait Ambulation/Gait assistance: Supervision Gait Distance (Feet): 50 Feet Assistive device: Rolling walker (2 wheels), 1 person hand held assist         General Gait Details: Pt with varying levels of dizziness t/o the ambulation effort, did not appear to be orthostatic related (BP was 155/76 in sitting after ambulation).  She was initially hesitant to do much walking but with cuing and encouragement was able to put together a decent walk but was clearly different from her safe and confident multiple loops around the nurses' station ~5 days ago with this PT   Stairs             Wheelchair Mobility     Tilt Bed    Modified Rankin (  Stroke Patients Only)       Balance Overall balance assessment: Needs assistance Sitting-balance support: Feet supported Sitting balance-Leahy Scale: Fair Sitting balance - Comments: no LOBs, etc but feeling poorly, c/o dizziness t/o the session     Standing balance-Leahy Scale: Fair Standing balance comment: reliant on walker today, struggled with TKE and standing  upright, no LOBs but again clearly not at the level of confidence seen on the eval                            Cognition Arousal: Alert Behavior During Therapy: [facility] for tasks assessed/performed Overall Cognitive Status: Difficult to assess                                 General Comments: Pt was able recall small details of prior session, follow cuing, etc but did present differently from an awareness/functional status per prior session        Exercises General Exercises - Lower Extremity Ankle Circles/Pumps: AROM, 10 reps Long Arc Quad: Strengthening, 10 reps, Both Heel Slides: Strengthening, 10 reps, Both (h/s curls EOB) Hip Flexion/Marching: Seated, Strengthening, 5 reps, Both    General Comments General comments (skin integrity, edema, etc.): Pt showing increased cognition/awareness per last few days, but not at her baseline      Pertinent Vitals/Pain Pain Assessment Pain Assessment: No/denies pain (c/o varying levels of dizziness t/o the session)    Home Living                          Prior Function            PT Goals (current goals can now be found in the care plan section) Progress towards PT goals:  (Pt's functional level is decreased since eval)    Frequency    Min 1X/week      PT Plan      Co-evaluation              AM-PAC PT "6 Clicks" Mobility   Outcome Measure  Help needed turning from your back to your side while in a flat bed without using bedrails?: A Little Help needed moving from lying on your back to sitting on the side of a flat bed without using bedrails?: A Little Help needed moving to and from a bed to a chair (including a wheelchair)?: A Little Help needed standing up from a chair using your arms (e.g., wheelchair or bedside chair)?: A Little Help needed to walk in hospital room?: A Little Help needed climbing 3-5 steps with a railing? : A Little 6 Click Score: 18    End of Session  Equipment Utilized During Treatment: Gait belt Activity Tolerance: Patient tolerated treatment well Patient left: with call bell/phone within reach;in bed;with chair alarm set;with family/visitor present Nurse Communication: Mobility status PT Visit Diagnosis: Unsteadiness on feet (R26.81);Dizziness and giddiness (R42)     Time: 0940-1030 PT Time Calculation (min) (ACUTE ONLY): 50 min  Charges:    $Gait Training: 8-22 mins $Therapeutic Exercise: 8-22 mins $Therapeutic Activity: 8-22 mins PT General Charges $$ ACUTE PT VISIT: 1 Visit                     Malachi Pro, DPT 04/23/2023, 1:41 PM

## 2023-04-23 NOTE — TOC Transition Note (Signed)
Transition of Care [facility]) - CM/SW Discharge Note   Patient Details  Name: Christine Burnett MRN: [id number] Date of Birth: September 12, 1940  [facility] CM/SW Contact:  Garret Reddish, RN Phone Number: 04/23/2023, 4:12 PM   Clinical Narrative:    Chart reviewed.  Noted that patient has orders for discharge today.    Noted that PT recommend that patient have home health PT.  I have spoken to patient and her daughter about Home Health services on discharge.  Patient did not have a home health preference.  I have asked Kandee Keen with [facility] to accept home health referral.    Patient reports that she has a cane and rolling walker at home.    I have informed staff nurse of the above information.    Final next level of care: Home w Home Health Services Barriers to Discharge: No Barriers Identified   Patient Goals and CMS Choice CMS Medicare.gov Compare Post Acute Care list provided to:: Patient Choice offered to / list presented to : Patient  Discharge Placement                    Name of family member notified: [name] patient's daughter Patient and family notified of of transfer: 04/23/23  Discharge Plan and Services Additional resources added to the After Visit Summary for                  DME Arranged:  (Patient has a rolling walker and cane at home)         HH Arranged: PT HH Agency: [facility] Date [facility] Agency Contacted: 04/23/23 Time HH Agency Contacted: 1100 Representative spoke with at [facility] Agency: Morrie Sheldon  Social Determinants of Health (SDOH) Interventions SDOH Screenings   Food Insecurity: No Food Insecurity (04/18/2023)  Housing: Patient Unable To Answer (04/18/2023)  Transportation Needs: No Transportation Needs (04/18/2023)  Utilities: Not At Risk (04/18/2023)  Alcohol Screen: Low Risk  (02/08/2021)  Depression (PHQ2-9): Medium Risk (04/03/2023)  Financial Resource Strain: Low Risk  (04/02/2023)  Physical Activity: Insufficiently Active  (04/02/2023)  Social Connections: Unknown (04/02/2023)  Stress: Stress Concern Present (04/02/2023)  Tobacco Use: Medium Risk (04/17/2023)     Readmission Risk Interventions     No data to display

## 2023-04-23 NOTE — Progress Notes (Signed)
RN was asked to assess pt due to new onset of confusion. Pt disoriented to place, time, and situation. Daughter at bedside and stated patient has never been this confused before. Patient was reoriented to environment. NIHSS=0. At the conclusion of the assessment pt had returned to baseline. Christine Pyo, NP was notified and came to assess patient. No new orders placed.

## 2023-04-23 NOTE — Discharge Summary (Signed)
Christine Burnett ZOX:[id number] DOB: [birth year] DOA: 04/17/2023  PCP: Danelle Berry, PA-C  Admit date: 04/17/2023 Discharge date: 04/23/2023  Time spent: 35 minutes  Recommendations for Outpatient Follow-up:  Pcp f/u, consider check of hemoglobin at f/u, also attention to BP at f/u Vascular surgery f/u 2 weeks (Dr. Wyn Quaker)     Discharge Diagnoses:  Principal Problem:   Stenosis of right internal carotid artery Active Problems:   TIA (transient ischemic attack)   Chest pain   Essential hypertension   Dyslipidemia   Anxiety and depression   Chronic obstructive pulmonary disease (COPD) (HCC)   Internal carotid artery stenosis, right   Discharge Condition: stable  Diet recommendation: heart heatlhy  Filed Weights   04/18/23 0039 04/20/23 0900 04/20/23 1718  Weight: 62 kg 62 kg 63.2 kg    History of present illness:  From admission h and p Christine Burnett is a 82 y.o. Caucasian female with medical history significant for stage III CKD, COPD, anxiety, osteoarthritis, dyslipidemia, CVA and TIA, who presented to the emergency room with acute onset of left leg weakness when she woke up today and felt her left leg was heavy.  When trying to ambulate her left leg was dragging behind her and she was unable to pick it up like the right leg.  This has eventually improved within 15 to 20 minutes and she later noted to have shakiness in her leg.  She also noticed some heaviness over the left side of her head that is now resolved.  No left upper extremity weakness or numbness.  She admits to chest pain earlier today that has resolved.  No associated nausea or vomiting or diaphoresis or radiation.  No dyspnea or cough or wheezing.  No bleeding diathesis.  No urinary or stool incontinence.  No witnessed seizures.  No tinnitus or vertigo.  She denied any dysuria, oliguria or hematuria or flank pain.   Hospital Course:   11/12: admitted to hospitalist service for TIA w/u. MRI brain concerning  for possible tiny acute ischemic infarcts and chronic hemorrhage in the anterior right frontal lobe, fairly cortical. Pending Echo and carotid US.  11/13: Per neurology, punctate possible strokes on MRI do not fit the clinical history provided, therefore favor these to be artifact and proceeding w/ EEG, MRA H/N. Echo no evidence for LA enlargement no need for Zio monitor. Seizure precautions, initiating lamotrigine and d/c BuSpar.  11/14: substantial atherosclerotic disease on MRA, CTA H/N especially involving  right internal carotid artery - high concern that her left leg weakness spells were limb shaking TIA. Vascular team here to see her to discuss intervention  11/15: angio w/ stent to R innominate artery. Hgb 12.0. 11/16: doing well postop, moved out of ICU, BP running low. Hgb to 9.3. Per vascular surgery ok to discharge home when medically stable on ASA/Brilinta and f/u 2 weeks - monitoring overnight d/t lower BP  11/17: Hgb down to 7.6 w/ fatigue. No s/s GI/GU bleeding, she does have ecchymosis to R groin at cath site but no pain. CT C/A/P no retroperitoneal bleed or other obvious bleeding. 1 unit PRBC, repeat HH pending. Hemoccult pending, consult to GI.   Anemia Likely post-op bleeding CT chest/abd/pelv wo contrast - no hematoma, (+)thickening sigmoid and diverticulosis but no obvious bleed GI consulted, no signs gi bleed, they declined intervention Hgb wnl prior to vascular procedure, dropped to 7.6 after, transfused 1 unit, hgb now stable around 9 for 2 days after Will start oral iron  R ICA stenosis   Vascular sugery applied stent 04/20/23   See below re: other TIA w/u and neuro recs    TIA, possible seizure-like activity but more concern for limb-shaking TIA Echo no LA enlargement, Afib less likely  R/o acute CVA EEG nonfocal R ICA occlusion on vascular imaging - see above  Vascular surgery address ICA as above  ok from vascular standpoint to d/c home on ASA/Brilinta, follow in  2 weeks with Dr. Wyn Quaker.    Consider neurology f/u if further episodes Evaluated by PT, stable for discharge with family supervision and home health PT which we have ordered   AKI on CKD3b likely d/t dehydration/contrast  Kidney function has returned to baseline   Delirium One episode overnight, resolved   Essential hypertension Holding antihypertensives for now given soft bps   Dyslipidemia Statin Lipid panel   Anxiety and depression Not well controlled on current home meds, advise close f/u with ordering provider   Chronic obstructive pulmonary disease (COPD) (HCC) Not symptomatic Albuterol prn  Continue home Breo  Procedures: 04/20/23: PROCEDURE:  Ultrasound guidance for vascular access right femoral artery Percutaneous transluminal angioplasty of the right iliac artery with 6 mm diameter by 8 cm length Lutonix drug-coated angioplasty balloon Thoracic aortogram Left cervical and cerebral carotid angiogram Right cervical and cerebral carotid angiogram Stent placement to the right innominate artery with the use of the Nav 6 embolic protection device with a 12 mm diameter by 38 mm length Lifestream stent.  Consultations: Neurology, vascular surgery  Discharge Exam: Vitals:   04/23/23 0454 04/23/23 0803  BP: (!) 104/56 100/69  Pulse: 88 92  Resp:  16  Temp: 97.7 F (36.5 C) 98.1 F (36.7 C)  SpO2: 93% 96%    General: NAD Cardiovascular: RRR Respiratory: CTAB Ext: warm, no edema  Discharge Instructions   Discharge Instructions     Diet - low sodium heart healthy   Complete by: As directed    Increase activity slowly   Complete by: As directed       Allergies as of 04/23/2023       Reactions   Zithromax [azithromycin]    Cefuroxime Axetil Rash   Ciprofloxacin Nausea Only   Unknown   Codeine Other (See Comments)   Patient does not like how the medication makes you feel         Medication List     STOP taking these medications     acetaminophen 650 MG CR tablet Commonly known as: TYLENOL       TAKE these medications    albuterol 108 (90 Base) MCG/ACT inhaler Commonly known as: Ventolin HFA Inhale 2 puffs into the lungs every 4 (four) hours as needed for wheezing or shortness of breath. What changed: Another medication with the same name was added. Make sure you understand how and when to take each.   albuterol (2.5 MG/3ML) 0.083% nebulizer solution Commonly known as: PROVENTIL Take 3 mLs (2.5 mg total) by nebulization every 4 (four) hours as needed for shortness of breath or wheezing. What changed: You were already taking a medication with the same name, and this prescription was added. Make sure you understand how and when to take each.   aspirin EC 81 MG tablet Take 81 mg by mouth at bedtime. Swallow whole.   atorvastatin 40 MG tablet Commonly known as: LIPITOR Take 1 tablet (40 mg total) by mouth at bedtime.   busPIRone 5 MG tablet Commonly known as: BUSPAR Take 1 tablet (5 mg  total) by mouth 2 (two) times daily as needed (stress anxiety).   Calcium 200 MG Tabs Take 300 mg by mouth every Monday, Tuesday, Wednesday, Thursday, and Friday.   calcium carbonate 500 MG chewable tablet Commonly known as: TUMS - dosed in mg elemental calcium Chew 1.5 tablets (300 mg of elemental calcium total) by mouth every Monday, Tuesday, Wednesday, Thursday, and Friday.   ferrous sulfate 325 (65 FE) MG EC tablet Take 1 tablet (325 mg total) by mouth every other day.   fluticasone furoate-vilanterol 100-25 MCG/ACT Aepb Commonly known as: BREO ELLIPTA Inhale 1 puff into the lungs daily.   melatonin 3 MG Tabs tablet Take 3 mg by mouth at bedtime. What changed: Another medication with the same name was added. Make sure you understand how and when to take each.   melatonin 5 MG Tabs Take 0.5 tablets (2.5 mg total) by mouth at bedtime. What changed: You were already taking a medication with the same name, and this  prescription was added. Make sure you understand how and when to take each.   multivitamin capsule Take 1 capsule by mouth daily.   multivitamin with minerals Tabs tablet Take 1 tablet by mouth daily.   ondansetron 4 MG/2ML Soln injection Commonly known as: ZOFRAN Inject 2 mLs (4 mg total) into the vein every 4 (four) hours as needed for nausea or vomiting.   PARoxetine 40 MG tablet Commonly known as: PAXIL Take 1 tablet (40 mg total) by mouth every morning.   senna-docusate 8.6-50 MG tablet Commonly known as: Senokot-S Take 1 tablet by mouth at bedtime as needed for moderate constipation.   ticagrelor 90 MG Tabs tablet Commonly known as: BRILINTA Take 1 tablet (90 mg total) by mouth 2 (two) times daily.   Vitamin D3 50 MCG (2000 UT) Tabs Take 2,000 Units by mouth at bedtime.       Allergies  Allergen Reactions   Zithromax [Azithromycin]    Cefuroxime Axetil Rash   Ciprofloxacin Nausea Only    Unknown   Codeine Other (See Comments)    Patient does not like how the medication makes you feel     Follow-up Information     Danelle Berry, PA-C Follow up.   Specialty: Family Medicine Why: Hospital follow up. Contact information: [address] [place] [address] [phone]         Annice Needy, MD Follow up.   Specialties: Vascular Surgery, Radiology, Interventional Cardiology Why: call to schedule an appointment in approximately 2 weeks Contact information: [address] [place] [address] [phone]                  The results of significant diagnostics from this hospitalization (including imaging, microbiology, ancillary and laboratory) are listed below for reference.    Significant Diagnostic Studies: CT CHEST ABDOMEN PELVIS WO CONTRAST  Result Date: 04/22/2023 CLINICAL DATA:  82 year old female with history of abrupt drop in hematocrit/hemoglobin. Evaluate for retroperitoneal bleeding. History of  non-small cell lung cancer. * Tracking Code: BO * EXAM: CT CHEST, ABDOMEN AND PELVIS WITHOUT CONTRAST TECHNIQUE: Multidetector CT imaging of the chest, abdomen and pelvis was performed following the standard protocol without IV contrast. RADIATION DOSE REDUCTION: This exam was performed according to the departmental dose-optimization program which includes automated exposure control, adjustment of the mA and/or kV according to patient size and/or use of iterative reconstruction technique. COMPARISON:  Chest CT 01/19/2022. PET-CT 01/31/2022. CT of the pelvis 10/31/2022. FINDINGS: CT CHEST  FINDINGS Cardiovascular: Heart size is normal. There is no significant pericardial fluid, thickening or pericardial calcification. There is aortic atherosclerosis, as well as atherosclerosis of the great vessels of the mediastinum and the coronary arteries, including calcified atherosclerotic plaque in the left main, left anterior descending, left circumflex and right coronary arteries. Calcifications of the aortic valve. Stent in the innominate artery. Mediastinum/Nodes: No pathologically enlarged mediastinal or hilar lymph nodes. Please note that accurate exclusion of hilar adenopathy is limited on noncontrast CT scans. Esophagus is unremarkable in appearance. No axillary lymphadenopathy. 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 Trace right pleural effusion lying dependently. Diffuse bronchial wall thickening with moderate centrilobular and paraseptal emphysema. No definite suspicious appearing pulmonary nodules or masses are noted. Musculoskeletal: Chronic compression fractures of T7 and T9, similar to prior studies, most severe at T9 where there is up to 70% loss of central vertebral body height. There are no aggressive appearing lytic or blastic lesions noted in the visualized  portions of the skeleton. CT ABDOMEN PELVIS FINDINGS Hepatobiliary: No definite suspicious appearing cystic or solid hepatic lesions are confidently identified on today's noncontrast CT examination. High attenuation lying dependently in the gallbladder, presumably biliary sludge. Pancreas: No definite pancreatic mass or peripancreatic fluid collections or inflammatory changes are noted on today's noncontrast CT examination. Spleen: Unremarkable. Adrenals/Urinary Tract: Multifocal cortical scarring in the kidneys bilaterally. Calcifications associated with both renal hila, favored to be vascular (nonobstructive calculi are not entirely excluded. No definite suspicious renal lesions. No hydroureteronephrosis. Urinary bladder is unremarkable in appearance. Bilateral adrenal glands are normal in appearance. Stomach/Bowel: The appearance of the stomach is normal. No pathologic dilatation of small bowel or colon. Numerous colonic diverticula are noted, without surrounding inflammatory changes to suggest an acute diverticulitis at this time. Thickening of the colonic wall in the region of the sigmoid colon, potentially chronic related to underlying diverticular disease (without focal surrounding inflammatory changes). Normal appendix. Vascular/Lymphatic: Atherosclerosis in the abdominal aorta and pelvic vasculature. 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 No lymphadenopathy noted in the abdomen or pelvis. Reproductive: Uterus and ovaries are atrophic and otherwise unremarkable in appearance. Other: No high attenuation fluid collection in the peritoneal cavity or retroperitoneum to suggest significant posttraumatic hemorrhage. No significant volume of ascites. No pneumoperitoneum. Musculoskeletal: Chronic compression fracture of superior endplate of L1 with 20% loss of anterior vertebral body height. Old healed fractures of the inferior pubic rami bilaterally. Status  post ORIF in the left proximal femur partially imaged. There are no aggressive appearing lytic or blastic lesions noted in the visualized portions of the skeleton. IMPRESSION: 1. No unexpected peritoneal or retroperitoneal hematoma. 2. Colonic diverticulosis without evidence of acute diverticulitis at this time. There is also some extensive mural thickening in the sigmoid colon which may be simply related to chronic diverticular disease. No discrete colonic mass confidently identified. 3. Biliary sludge in the gallbladder. No findings to suggest an acute cholecystitis at this time. 4. Chronic postradiation changes in the right lung, similar to prior studies. No definitive findings to suggest locally recurrent disease or metastatic disease in the chest, abdomen or pelvis. 5. Trace right pleural effusion. 6. Aortic atherosclerosis, in addition to left main and three-vessel coronary artery disease. 7. There are calcifications of the aortic valve. Echocardiographic correlation for evaluation of potential valvular dysfunction may be warranted if clinically  indicated. 8. Additional incidental findings, as above. Electronically Signed   By: Trudie Reed M.D.   On: 04/22/2023 11:29   PERIPHERAL VASCULAR CATHETERIZATION  Result Date: 04/20/2023 See surgical note for result.  EEG adult  Result Date: 04/18/2023 Charlsie Quest, MD     04/18/2023  8:52 PM Patient Name: Christine Burnett MRN: [id number] Epilepsy Attending: Charlsie Quest Referring Physician/Provider: Gordy Councilman, MD Date: 04/18/2023 Duration: 26.06 mins Patient history: 82 yo F with rhythmic shaking of the left lower extremity with classic description of a jacksonian march. EEG to evaluate for seizure Level of alertness: Awake, asleep AEDs during EEG study: None Technical aspects: This EEG study was done with scalp electrodes positioned according to the 10-20 International system of electrode placement. Electrical activity was reviewed  with band pass filter of 1-70Hz , sensitivity of 7 uV/mm, display speed of 36mm/sec with a 60Hz  notched filter applied as appropriate. EEG data were recorded continuously and digitally stored.  Video monitoring was available and reviewed as appropriate. Description: The posterior dominant rhythm consists of 8 Hz activity of moderate voltage (25-35 uV) seen predominantly in posterior head regions, symmetric and reactive to eye opening and eye closing. Sleep was characterized by vertex waves, sleep spindles (12 to 14 Hz), maximal frontocentral region. EEG showed intermittent generalized 3 to 6 Hz theta-delta slowing. Hyperventilation and photic stimulation were not performed.   ABNORMALITY - Intermittent slow, generalized IMPRESSION: This study is suggestive of mild diffuse encephalopathy. No seizures or epileptiform discharges were seen throughout the recording. Charlsie Quest   CT ANGIO HEAD NECK W WO CM  Result Date: 04/18/2023 CLINICAL DATA:  Abnormality in the basilar artery on MRA, possible acute infarcts on MRI EXAM: CT ANGIOGRAPHY HEAD AND NECK WITH AND WITHOUT CONTRAST TECHNIQUE: Multidetector CT imaging of the head and neck was performed using the standard protocol during bolus administration of intravenous contrast. Multiplanar CT image reconstructions and MIPs were obtained to evaluate the vascular anatomy. Carotid stenosis measurements (when applicable) are obtained utilizing NASCET criteria, using the distal internal carotid diameter as the denominator. RADIATION DOSE REDUCTION: This exam was performed according to the departmental dose-optimization program which includes automated exposure control, adjustment of the mA and/or kV according to patient size and/or use of iterative reconstruction technique. CONTRAST:  75mL OMNIPAQUE IOHEXOL 350 MG/ML SOLN COMPARISON:  07/03/2014 CTA and, correlation is made with 04/18/2023 MRI head and MRA head and 04/17/2023 CT head FINDINGS: CT HEAD FINDINGS Brain:  The infarcts noted on the recent MRI are not apparent on CT. No evidence of new acute infarct, hemorrhage, mass, mass effect, or midline shift. No hydrocephalus or extra-axial fluid collection. Periventricular white matter changes, likely the sequela of chronic small vessel ischemic disease. Vascular: No hyperdense vessel. Atherosclerotic calcifications in the intracranial carotid and vertebral arteries. Skull: Negative for fracture or focal lesion. Sinuses/Orbits: Mucosal thickening in the ethmoid air cells. No acute finding in the orbits. Other: The mastoid air cells are well aerated. CTA NECK FINDINGS Aortic arch: Standard branching. Imaged portion shows no evidence of aneurysm or dissection. Evaluation of the arch vessel origins is limited by beam hardening artifact from the adjacent contrast bolus, but there is at least 50% stenosis at each of the vessel origins. Right carotid system: No evidence of dissection, occlusion, or hemodynamically significant stenosis (greater than 50%). Atherosclerotic disease at the bifurcation and in the proximal and distal ICA is not hemodynamically significant. Comparatively poor opacification of the right ICA compared to the left may  be related to the degree of stenosis at the origin of the brachiocephalic artery. Left carotid system: No evidence of dissection, occlusion, or hemodynamically significant stenosis (greater than 50%). Atherosclerotic disease at the bifurcation and in the proximal and distal ICA is not hemodynamically significant. Vertebral arteries: Poor visualization of the origin of the right vertebral artery secondary to the adjacent contrast bolus. Within that limitation, there is likely severe stenosis at the origin of the right vertebral artery, which is very poorly opacified until the V3 segment (series 10, image 27). Moderate stenosis at the origin of left vertebral artery, which is non dominant. The left vertebral artery is patent through the skull base. No  evidence of dissection. Skeleton: No acute osseous abnormality. Degenerative changes in the cervical spine. Other neck: No acute finding. Upper chest: Emphysema. No focal pulmonary opacity or pleural effusion. Review of the MIP images confirms the above findings CTA HEAD FINDINGS Anterior circulation: Both internal carotid arteries are patent to the termini, with calcifications but without significant stenosis, although the right ICA is poorly opacified. 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 A1 segments patent. Normal anterior communicating artery. Anterior cerebral arteries are patent to their distal aspects without significant stenosis. No M1 stenosis or occlusion. MCA branches perfused to their distal aspects without significant stenosis. Opacification of the right ACA and MCA is improved compared to the right ICA, likely reflecting collateral flow from the right posterior communicating artery. Posterior circulation: Moderate to severe stenosis in the proximal left V4 (series 10, images 155, 152, and 145). The left vertebral artery primarily supplies the left PICA, which is patent. Mild stenosis in the proximal right V4, which is otherwise patent to the vertebrobasilar junction. The right PICA is also patent. Given poor opacification of the extracranial V4, this is likely retrograde. Basilar patent to its distal aspect without significant stenosis. Superior cerebellar arteries patent proximally. Patent P1 segments. The bilateral posterior communicating arteries are patent. PCAs perfused to their distal aspects without significant stenosis. Venous sinuses: As permitted by contrast timing, patent. Anatomic variants: None significant. No evidence of aneurysm or vascular malformation. Review of the MIP images confirms the above findings IMPRESSION: 1. The  infarcts noted on the recent MRI are not apparent on CT. No evidence of new acute infarct. 2. No intracranial large vessel occlusion. 3. 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 4. Poor opacification of the right ICA, which may be related to stenosis at the origin of the brachiocephalic artery. Improved opacification of the right ACA and MCA is likely collateral flow from the posterior communicating artery. 5. Moderate stenosis at the origin of the left vertebral artery, with additional moderate to severe stenosis in the proximal left V4. 6. 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 7. Evaluation of the arch vessel origins is limited by beam hardening artifact from the adjacent contrast bolus, but there is likely at least 50% stenosis at each of the vessel origins. \ Electronically Signed   By: Wiliam Ke M.D.   On: 04/18/2023 19:41   US Carotid Bilateral  Result Date: 04/18/2023 CLINICAL DATA:  TIA Hyperlipidemia Former tobacco  user EXAM: BILATERAL CAROTID DUPLEX ULTRASOUND TECHNIQUE: Wallace Cullens scale imaging, color Doppler and duplex ultrasound were performed of bilateral carotid and vertebral arteries in the neck. COMPARISON:  07/29/2018 FINDINGS: Criteria: Quantification of carotid stenosis is based on velocity parameters that correlate the residual internal carotid diameter with NASCET-based stenosis levels, using the diameter of the distal internal carotid lumen as the denominator for stenosis measurement. The following velocity measurements were obtained: RIGHT ICA: 39/4 cm/sec CCA: 18/11 cm/sec SYSTOLIC ICA/CCA RATIO:  2.2 ECA: 50 cm/sec LEFT ICA: 242/36 cm/sec CCA: 77/15 cm/sec SYSTOLIC ICA/CCA RATIO:  3.1 ECA: 156 cm/sec RIGHT CAROTID ARTERY: Mild calcified plaque of the  distal common carotid artery. Common carotid artery waveform is abnormal and monophasic. Internal carotid artery waveform is abnormal with reversal of systolic flow. RIGHT VERTEBRAL ARTERY:  Bidirectional flow. LEFT CAROTID ARTERY: Bulky calcified plaque noted at the distal left common and proximal internal carotid arteries. Common carotid artery waveform is abnormal and monophasic. LEFT VERTEBRAL ARTERY:  Antegrade flow. Right upper extremity blood pressure is significantly lower than left with right side measuring 130/98 mm Hg and left measuring 164/82 mm Hg. IMPRESSION: 1. Monophasic waveform in the right common carotid artery and reversal of systolic flow in right internal carotid artery consistent with significant proximal stenosis. The site of significant origin of the right brachiocephalic artery asymmetrically decreased right upper extremity blood pressure as well as bidirectional flow in the right vertebral artery. 2. Monophasic waveform in the left common carotid artery consistent with significant stenosis at the origin of the common carotid artery. 3. 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 4. Further evaluation with CT angiography of the neck is recommended. Electronically Signed   By: Acquanetta Belling M.D.   On: 04/18/2023 15:32   MR ANGIO HEAD WO CONTRAST  Result Date: 04/18/2023 CLINICAL DATA:  Neuro deficit, acute, stroke suspected EXAM: MRA HEAD WITHOUT CONTRAST TECHNIQUE: Angiographic images of the Circle of Willis were acquired using MRA technique without intravenous contrast. 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 Normal flow void was visualized on same day brain MRI. There is reconstitution of the level of the ICA terminus via collaterals from the  circle-of-Willis. There is normal arborization in the bilateral MCA territories. Posterior circulation: There is nearly absent flow void in the basilar artery with robust PCOM bilaterally. P2 and P3 segments demonstrate normal flow signal. Anatomic variants: Fetal PCAs bilaterally. Other: None. IMPRESSION: 1. Asymmetrically decreased flow void in the right petrous, cavernous, and supraclinoid ICA is worrisome for interval development of a new right ICA occlusion. There is reconstitution of the level of the ICA terminus via collaterals from the circle-of-Willis. Recommend further evaluation with a CT head and neck angiogram 2. Nearly absent flow void in the basilar artery with robust PCOM bilaterally. 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 Recommend attention on CTA. These results will be called to the ordering clinician or representative by the Radiologist Assistant, and communication documented in the PACS or [employer]. Electronically Signed   By: Lorenza Cambridge M.D.   On: 04/18/2023 14:48   ECHOCARDIOGRAM COMPLETE BUBBLE STUDY  Result Date: 04/18/2023    ECHOCARDIOGRAM REPORT   Patient Name:   ALZADA ORPILLA Date of Exam: 04/18/2023 Medical Rec #:  [id number]  Height:       66.0 in Accession #:    [id number]         Weight:       136.7 lb Date of Birth:  05-04-1941          BSA:          1.701 m Patient Age:    82 years           BP:           149/74 mmHg Patient Gender: F                  HR:           93 bpm. Exam Location:  [facility] Procedure: 2D Echo, Cardiac Doppler, Color Doppler and Saline Contrast Bubble            Study Indications:     TIA 435.9 / G45.9  History:         Patient has prior history of Echocardiogram examinations, most                  recent 01/26/2021. COPD, TIA and Stroke; Risk                  Factors:Dyslipidemia.  Sonographer:     Cristela Blue Referring Phys:  [id number] JAN A MANSY Diagnosing Phys: Julien Nordmann MD IMPRESSIONS  1. Left  ventricular ejection fraction, by estimation, is 55 to 60%. The left ventricle has normal function. The left ventricle has no regional wall motion abnormalities. Left ventricular diastolic parameters are consistent with Grade I diastolic dysfunction (impaired relaxation).  2. Right ventricular systolic function is normal. The right ventricular size is normal. There is normal pulmonary artery systolic pressure. The estimated right ventricular systolic pressure is 33.3 mmHg.  3. The mitral valve is normal in structure. Moderate mitral valve regurgitation. No evidence of mitral stenosis.  4. Tricuspid valve regurgitation is moderate.  5. The aortic valve is normal in structure. Aortic valve regurgitation is mild. Aortic valve sclerosis/calcification is present, without any evidence of aortic stenosis.  6. The inferior vena cava is normal in size with greater than 50% respiratory variability, suggesting right atrial pressure of 3 mmHg.  7. Agitated saline contrast bubble study was negative, with no evidence of any interatrial shunt. FINDINGS  Left Ventricle: Left ventricular ejection fraction, by estimation, is 55 to 60%. The left ventricle has normal function. The left ventricle has no regional wall motion abnormalities. The left ventricular internal cavity size was normal in size. There is  no left ventricular hypertrophy. Left ventricular diastolic parameters are consistent with Grade I diastolic dysfunction (impaired relaxation). Right Ventricle: The right ventricular size is normal. No increase in right ventricular wall thickness. Right ventricular systolic function is normal. There is normal pulmonary artery systolic pressure. The tricuspid regurgitant velocity is 2.66 m/s, and  with an assumed right atrial pressure of 5 mmHg, the estimated right ventricular systolic pressure is 33.3 mmHg. Left Atrium: Left atrial size was normal in size. Right Atrium: Right atrial size was normal in size. Pericardium: There is no  evidence of pericardial effusion. Mitral Valve: The mitral valve is normal in structure. There is mild calcification of the mitral valve leaflet(s). Moderate mitral valve regurgitation. No evidence of mitral valve stenosis. MV peak gradient, 10.4 mmHg. The mean mitral valve gradient is 4.0 mmHg. Tricuspid Valve: The tricuspid valve is normal in structure. Tricuspid valve regurgitation is moderate . No evidence of tricuspid stenosis.  Aortic Valve: The aortic valve is normal in structure. Aortic valve regurgitation is mild. Aortic valve sclerosis/calcification is present, without any evidence of aortic stenosis. Aortic valve mean gradient measures 4.2 mmHg. Aortic valve peak gradient measures 7.0 mmHg. Aortic valve area, by VTI measures 2.26 cm. Pulmonic Valve: The pulmonic valve was normal in structure. Pulmonic valve regurgitation is not visualized. No evidence of pulmonic stenosis. Aorta: The aortic root is normal in size and structure. Venous: The inferior vena cava is normal in size with greater than 50% respiratory variability, suggesting right atrial pressure of 3 mmHg. IAS/Shunts: No atrial level shunt detected by color flow Doppler. Agitated saline contrast was given intravenously to evaluate for intracardiac shunting. Agitated saline contrast bubble study was negative, with no evidence of any interatrial shunt. There  is no evidence of a patent foramen ovale. There is no evidence of an atrial septal defect.  LEFT VENTRICLE PLAX 2D LVIDd:         4.10 cm   Diastology LVIDs:         2.40 cm   LV e' medial:    5.11 cm/s LV PW:         1.00 cm   LV E/e' medial:  13.5 LV IVS:        1.00 cm   LV e' lateral:   5.98 cm/s LVOT diam:     2.00 cm   LV E/e' lateral: 11.6 LV SV:         59 LV SV Index:   35 LVOT Area:     3.14 cm  RIGHT VENTRICLE RV Basal diam:  2.20 cm RV Mid diam:    1.50 cm LEFT ATRIUM           Index        RIGHT ATRIUM          Index LA diam:      2.40 cm 1.41 cm/m   RA Area:     9.17 cm LA Vol  (A2C): 18.4 ml 10.82 ml/m  RA Volume:   16.70 ml 9.82 ml/m LA Vol (A4C): 26.5 ml 15.58 ml/m  AORTIC VALVE AV Area (Vmax):    1.84 cm AV Area (Vmean):   1.76 cm AV Area (VTI):     2.26 cm AV Vmax:           132.50 cm/s AV Vmean:          97.350 cm/s AV VTI:            0.260 m AV Peak Grad:      7.0 mmHg AV Mean Grad:      4.2 mmHg LVOT Vmax:         77.40 cm/s LVOT Vmean:        54.600 cm/s LVOT VTI:          0.187 m LVOT/AV VTI ratio: 0.72  AORTA Ao Root diam: 2.90 cm MITRAL VALVE                TRICUSPID VALVE MV Area (PHT): 8.62 cm     TR Peak grad:   28.3 mmHg MV Area VTI:   2.27 cm     TR Vmax:        266.00 cm/s MV Peak grad:  10.4 mmHg MV Mean grad:  4.0 mmHg     SHUNTS MV Vmax:       1.61 m/s     Systemic VTI:  0.19 m MV Vmean:  93.8 cm/s    Systemic Diam: 2.00 cm MV Decel Time: 88 msec MV E velocity: 69.20 cm/s MV A velocity: 123.00 cm/s MV E/A ratio:  0.56 Julien Nordmann MD Electronically signed by Julien Nordmann MD Signature Date/Time: 04/18/2023/11:16:32 AM    Final    MR BRAIN WO CONTRAST  Result Date: 04/18/2023 CLINICAL DATA:  Initial evaluation for acute TIA. EXAM: MRI HEAD WITHOUT CONTRAST TECHNIQUE: Multiplanar, multiecho pulse sequences of the brain and surrounding structures were obtained without intravenous contrast. COMPARISON:  Prior CT from 04/17/2023. FINDINGS: Brain: Diffuse prominence of the CSF containing spaces compatible generalized cerebral atrophy. Patchy and confluent T2/FLAIR hyperintensity involving the periventricular and deep white matter both cerebral hemispheres as well as the pons, most likely related to chronic microvascular ischemic disease, advanced in nature. Small remote bilateral cerebellar infarcts, left greater than right. There is question of a punctate focus of restricted diffusion positioned along the midline of the splenium, seen only on coronal DWI sequence (series 18, image 24). Additionally, there is a questionable punctate focus of diffusion  signal abnormality within the right cerebellar hemisphere as well (series 9, image 62). While these findings could be artifactual in nature, possible tiny acute ischemic infarcts are difficult to exclude, and could be considered in correct clinical setting. No associated hemorrhage. No other evidence for acute or subacute ischemia. Gray-white matter differentiation otherwise maintained. No acute intracranial hemorrhage. Small focus of chronic hemosiderin staining noted at the high anterior right frontal lobe (series 14, image 50), suggesting prior hemorrhage at this location. No mass lesion, midline shift or mass effect. No hydrocephalus or extra-axial fluid collection. Pituitary gland suprasellar region within normal limits. Vascular: Major intracranial vascular flow voids are maintained. Skull and upper cervical spine: Craniocervical junctional limits. Bone marrow signal intensity heterogeneous but overall within normal limits. No scalp soft tissue abnormality. Sinuses/Orbits: Globes and orbital soft tissues within normal limits. Scattered postop thickening present about the ethmoidal air cells. Paranasal sinuses are otherwise clear. No mastoid effusion. Other: None. IMPRESSION: 1. Question punctate foci of restricted diffusion involving the mid splenium and within the right cerebellar hemisphere as above. While these findings could be artifactual in nature, possible tiny acute ischemic infarcts are difficult to exclude, and could be considered in correct clinical setting. No associated hemorrhage. 2. 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 Electronically Signed   By: Rise Mu M.D.   On: 04/18/2023 02:09   CT HEAD WO CONTRAST  Result Date: 04/17/2023 CLINICAL DATA:  A shin for acute blurry vision, speech difficulty, left lower extremity weakness. EXAM: CT HEAD WITHOUT  CONTRAST TECHNIQUE: Contiguous axial images were obtained from the base of the skull through the vertex without intravenous contrast. RADIATION DOSE REDUCTION: This exam was performed according to the departmental dose-optimization program which includes automated exposure control, adjustment of the mA and/or kV according to patient size and/or use of iterative reconstruction technique. COMPARISON:  Prior study from 07/13/2020. FINDINGS: Brain: Moderately advanced cerebral atrophy. 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 Remote left cerebellar infarct noted. No acute intracranial hemorrhage. No acute large vessel territory infarct. No mass lesion or midline shift. No hydrocephalus or extra-axial fluid collection. Vascular: No abnormal hyperdense vessel. Calcified atherosclerosis present at the skull base. Skull: Scalp soft tissues demonstrate no acute finding. Calvarium intact. Sinuses/Orbits: Globes orbital soft tissues within normal limits. Mild mucosal thickening  present about the left ethmoidal air cells. Visualized paranasal sinuses are otherwise clear. No mastoid effusion. Other: None. IMPRESSION: 1. No acute intracranial abnormality. 2. Moderately advanced cerebral atrophy with advanced chronic microvascular ischemic disease. 3. Remote left cerebellar infarct. Electronically Signed   By: Rise Mu M.D.   On: 04/17/2023 20:01    Microbiology: Recent Results (from the past 240 hour(s))  MRSA Next Gen by PCR, Nasal     Status: None   Collection Time: 04/20/23  5:24 PM   Specimen: Nasal Mucosa; Nasal Swab  Result Value Ref Range Status   MRSA by PCR Next Gen NOT DETECTED NOT DETECTED Final    Comment: (NOTE) The GeneXpert MRSA Assay (FDA approved for NASAL specimens only), is one component of a comprehensive MRSA colonization surveillance program. It is not intended to diagnose MRSA  infection nor to guide or monitor treatment for MRSA infections. Test performance is not FDA approved in patients less than 34 years old. Performed at [facility], [address] [place], [place] [address]      Labs: Basic Metabolic Panel: Recent Labs  Lab 04/19/23 [phone] 04/20/23 0438 04/21/23 0342 04/22/23 0513 04/23/23 0452  NA 136 142 136 135 138  K 4.3 4.5 4.5 3.9 3.6  CL 102 106 106 105 106  CO2 26 26 23 23 24   GLUCOSE 106* 108* 133* 109* 96  BUN 28* 27* 25* 23 21  CREATININE 1.80* 1.77* 1.48* 1.65* 1.48*  CALCIUM 9.4 9.5 8.7* 8.7* 8.7*   Liver Function Tests: No results for input(s): "AST", "ALT", "ALKPHOS", "BILITOT", "PROT", "ALBUMIN" in the last 168 hours. No results for input(s): "LIPASE", "AMYLASE" in the last 168 hours. No results for input(s): "AMMONIA" in the last 168 hours. CBC: Recent Labs  Lab 04/17/23 1843 04/20/23 0438 04/21/23 0342 04/22/23 0513 04/22/23 1832 04/23/23 0452  WBC 5.5 5.9 9.3 7.6  --  8.0  HGB 12.1 12.0 9.3* 7.6* 9.1* 8.8*  HCT 36.5 35.7* 29.2* 23.3* 27.0* 26.1*  MCV 101.7* 97.8 103.9* 103.1*  --  97.8  PLT 199 200 172 132*  --  147*   Cardiac Enzymes: No results for input(s): "CKTOTAL", "CKMB", "CKMBINDEX", "TROPONINI" in the last 168 hours. BNP: BNP (last 3 results) No results for input(s): "BNP" in the last 8760 hours.  ProBNP (last 3 results) No results for input(s): "PROBNP" in the last 8760 hours.  CBG: Recent Labs  Lab 04/20/23 1722  GLUCAP 110*       Signed:  Silvano Bilis MD.  [facility] 04/23/2023, 12:20 PM

## 2023-04-24 ENCOUNTER — Telehealth: Payer: Self-pay

## 2023-04-24 NOTE — Transitions of Care (Post Inpatient/ED Visit) (Signed)
04/24/2023  Name: Christine Burnett MRN: [id number] DOB: 22-Sep-1940  Today's TOC FU Call Status: Today's TOC FU Call Status:: Successful TOC FU Call Completed TOC FU Call Complete Date: 04/24/23 Patient's Name and Date of Birth confirmed.  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 How have you been since you were released from the hospital?: Better Any questions or concerns?: Yes Patient Questions/Concerns:: wound care- DD Patient Questions/Concerns Addressed: Other: (reviewed basic DD)  Items Reviewed: Did you receive and understand the discharge instructions provided?: Yes Medications obtained,verified, and reconciled?: Yes (Medications Reviewed) Any new allergies since your discharge?: No Dietary orders reviewed?: Yes Type of Diet Ordered:: Reg NAS Do you have support at home?: Yes People in Home: child(ren), adult Name of Support/Comfort Primary Source: Lives with Daughter , they take turns , but she is usually self sufficient Christine Burnett and Christine Burnett  Medications Reviewed Today: Medications Reviewed Today     Reviewed by Christine Burnett (Registered Nurse) on 04/24/23 at 1229  Med List Status: <None>   Medication Order Taking? Sig Documenting Provider Last Dose Status Informant  albuterol (PROVENTIL) (2.5 MG/3ML) 0.083% nebulizer solution [id number] No Take 3 mLs (2.5 mg total) by nebulization every 4 (four) hours as needed for shortness of breath or wheezing.  Patient not taking: Reported on 04/24/2023   Christine Nielsen, DO Not Taking Active            Med Note Christine Burnett, Christine Burnett   Tue Apr 24, 2023 12:22 PM) Can not use- increased anxiety  albuterol (VENTOLIN HFA) 108 (90 Base) MCG/ACT inhaler [id number] Yes Inhale 2 puffs into the lungs every 4 (four) hours  as needed for wheezing or shortness of breath. Mecum, Christine Conroy, PA-C Taking Active Self  aspirin 81 MG EC tablet [id number] Yes Take 81 mg by mouth at bedtime. Swallow whole. [provider] Taking Active Self  atorvastatin (LIPITOR) 40 MG tablet [id number] Yes Take 1 tablet (40 mg total) by mouth at bedtime. Mecum, Christine Conroy, PA-C Taking Active Self  busPIRone (BUSPAR) 5 MG tablet [id number] No Take 1 tablet (5 mg total) by mouth 2 (two) times daily as needed (stress anxiety).  Patient not taking: Reported on 04/24/2023   Mecum, Christine Conroy, PA-C Not Taking Active Self           Med Note Christine Burnett   Tue Apr 24, 2023 12:23 PM) Not using   Calcium 200 MG TABS [id number] Yes Take 300 mg by mouth every Monday, Tuesday, Wednesday, Thursday, and Friday. [provider] Taking Active Self           Med Note Christine Burnett, Jersi Mcmaster Elbert Ewings   Tue Apr 24, 2023 12:23 PM) See TUMS  calcium carbonate (TUMS - DOSED IN MG ELEMENTAL CALCIUM) 500 MG chewable tablet [id number] Yes Chew 1.5 tablets (300 mg of elemental calcium total) by mouth every Monday, Tuesday, Wednesday, Thursday, and Friday. Christine Nielsen, DO Taking Active   Cholecalciferol (VITAMIN D3) 50 MCG (2000 UT) TABS [id number] Yes Take 2,000 Units by mouth at bedtime. [provider] Taking Active Self  ferrous sulfate 325 (65 FE) MG EC tablet [id number] Yes Take 1 tablet (325 mg total) by mouth every other day. Wouk, Christine Curtis, MD Taking Active   fluticasone furoate-vilanterol (BREO ELLIPTA) 100-25 MCG/ACT AEPB [id number] Yes Inhale 1 puff into the lungs daily. Christine Burnett,  Christine Mends, PA-C Taking Active Self  melatonin 3 MG TABS tablet [id number] No Take 3 mg by mouth at bedtime.  Patient not taking: Reported on 04/24/2023   [provider] Not Taking Active Self  melatonin 5 MG TABS [id number] Yes Take 0.5 tablets (2.5 mg total) by mouth at bedtime. Christine Nielsen, DO Taking Active            Med Note Christine Burnett, Lenor Provencher Burnett   Tue Apr 24, 2023 12:26 PM) Takes 10 mg nightly  Multiple Vitamin (MULTIVITAMIN) capsule [id number] Yes Take 1 capsule by mouth daily. [provider] Taking Active Self           Med Note Christine Burnett, Lakeisha Waldrop Burnett   Tue Apr 24, 2023 12:27 PM) Uses gummies  PARoxetine (PAXIL) 40 MG tablet [id number] Yes Take 1 tablet (40 mg total) by mouth every morning. Mecum, Christine E, PA-C Taking Active Self  senna-docusate (SENOKOT-S) 8.6-50 MG tablet [id number] Yes Take 1 tablet by mouth at bedtime as needed for moderate constipation. Wouk, Christine Curtis, MD Taking Active   ticagrelor (BRILINTA) 90 MG TABS tablet [id number] Yes Take 1 tablet (90 mg total) by mouth 2 (two) times daily. Wouk, Christine Curtis, MD Taking Active             Home Care and Equipment/Supplies: Were Home Health Services Ordered?: Yes Name of Home Health Agency:: She velieves [facility], but would like ro consider [facility] Has Agency set up a time to come to your home?: No EMR reviewed for Home Health Orders: Orders present/patient has not received call (refer to CM for follow-up) (was told 48 hrs after d/c) Any new equipment or medical supplies ordered?: No  Functional Questionnaire: Do you need assistance with bathing/showering or dressing?: No Do you need assistance with meal preparation?: No Do you need assistance with eating?: No Do you have difficulty maintaining continence: No Do you need assistance with getting out of bed/getting out of a chair/moving?: Yes (slight due to weakness s/p hospital stay uses cane and walker if needed) Do you have difficulty managing or taking your medications?: No (usually keeps in original containers , due to weakness daughter set up pill boxes)  Follow up appointments reviewed: PCP Follow-up appointment confirmed?: Yes Date of PCP follow-up appointment?: 04/30/23 Follow-up Provider: Danelle Burnett Specialist [facility] Follow-up appointment confirmed?: No Reason Specialist Follow-Up Not Confirmed: Patient has  Specialist Provider Number and will Call for Appointment (She is calling Surgeon today) Do you need transportation to your follow-up appointment?: No (daughter will transport, but she usually drives) Do you understand care options if your condition(s) worsen?: Yes-patient verbalized understanding  SDOH Interventions Today    Flowsheet Row Most Recent Value  SDOH Interventions   Food Insecurity Interventions Intervention Not Indicated  Housing Interventions Intervention Not Indicated  Transportation Interventions Patient Resources (Friends/Family), Intervention Not Indicated  Utilities Interventions Intervention Not Indicated        Discussed [facility]BC[facility]  TOC program and weekly calls to patient to assess condition/status, medication management  and provide support/education as indicated . Patient/ Caregiver voiced understanding and declined enrollment in the 30-day TOC Program.  Daughter feels this process was not complicated, she will have HH , appts are set up and she and her sister provide supervision     The patient has been provided with contact information for the care management team and has been advised to call with any health related questions or concerns.    Susa Loffler , BSN, Burnett Care Management Coordinator [facility]   [facility] [email or web address] Direct Dial: [phone]

## 2023-04-25 ENCOUNTER — Encounter: Payer: Self-pay | Admitting: Vascular Surgery

## 2023-04-26 ENCOUNTER — Encounter: Payer: Self-pay | Admitting: Emergency Medicine

## 2023-04-26 ENCOUNTER — Other Ambulatory Visit: Payer: Self-pay

## 2023-04-26 ENCOUNTER — Emergency Department
Admission: EM | Admit: 2023-04-26 | Discharge: 2023-04-26 | Disposition: A | Discharge status: Home / Self Care | Payer: Medicare Other | Attending: Emergency Medicine | Admitting: Emergency Medicine

## 2023-04-26 DIAGNOSIS — Z7982 Long term (current) use of aspirin: Secondary | ICD-10-CM | POA: Diagnosis not present

## 2023-04-26 DIAGNOSIS — Z48812 Encounter for surgical aftercare following surgery on the circulatory system: Secondary | ICD-10-CM | POA: Diagnosis not present

## 2023-04-26 DIAGNOSIS — I129 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 stage 4 chronic kidney disease, or unspecified chronic kidney disease: Secondary | ICD-10-CM | POA: Diagnosis not present

## 2023-04-26 DIAGNOSIS — Z7951 Long term (current) use of inhaled steroids: Secondary | ICD-10-CM | POA: Diagnosis not present

## 2023-04-26 DIAGNOSIS — K5730 Diverticulosis of large intestine without perforation or abscess without bleeding: Secondary | ICD-10-CM | POA: Diagnosis not present

## 2023-04-26 DIAGNOSIS — S40021A Contusion of right upper arm, initial encounter: Secondary | ICD-10-CM | POA: Diagnosis not present

## 2023-04-26 DIAGNOSIS — F419 Anxiety disorder, unspecified: Secondary | ICD-10-CM | POA: Diagnosis not present

## 2023-04-26 DIAGNOSIS — F32A Depression, unspecified: Secondary | ICD-10-CM | POA: Diagnosis not present

## 2023-04-26 DIAGNOSIS — M1990 Unspecified osteoarthritis, unspecified site: Secondary | ICD-10-CM | POA: Diagnosis not present

## 2023-04-26 DIAGNOSIS — Z7902 Long term (current) use of antithrombotics/antiplatelets: Secondary | ICD-10-CM | POA: Diagnosis not present

## 2023-04-26 DIAGNOSIS — I083 Combined rheumatic disorders of mitral, aortic and tricuspid valves: Secondary | ICD-10-CM | POA: Diagnosis not present

## 2023-04-26 DIAGNOSIS — Z9981 Dependence on supplemental oxygen: Secondary | ICD-10-CM | POA: Diagnosis not present

## 2023-04-26 DIAGNOSIS — E785 Hyperlipidemia, unspecified: Secondary | ICD-10-CM | POA: Diagnosis not present

## 2023-04-26 DIAGNOSIS — X58XXXA Exposure to other specified factors, initial encounter: Secondary | ICD-10-CM | POA: Insufficient documentation

## 2023-04-26 DIAGNOSIS — N1832 Chronic kidney disease, stage 3b: Secondary | ICD-10-CM | POA: Diagnosis not present

## 2023-04-26 DIAGNOSIS — D631 Anemia in chronic kidney disease: Secondary | ICD-10-CM | POA: Diagnosis not present

## 2023-04-26 DIAGNOSIS — S59911A Unspecified injury of right forearm, initial encounter: Secondary | ICD-10-CM | POA: Diagnosis present

## 2023-04-26 DIAGNOSIS — R519 Headache, unspecified: Secondary | ICD-10-CM | POA: Diagnosis not present

## 2023-04-26 DIAGNOSIS — R070 Pain in throat: Secondary | ICD-10-CM | POA: Insufficient documentation

## 2023-04-26 DIAGNOSIS — S5011XA Contusion of right forearm, initial encounter: Secondary | ICD-10-CM | POA: Insufficient documentation

## 2023-04-26 DIAGNOSIS — J449 Chronic obstructive pulmonary disease, unspecified: Secondary | ICD-10-CM | POA: Diagnosis not present

## 2023-04-26 DIAGNOSIS — N179 Acute kidney failure, unspecified: Secondary | ICD-10-CM | POA: Diagnosis not present

## 2023-04-26 DIAGNOSIS — I700 Atherosclerosis of aorta: Secondary | ICD-10-CM | POA: Diagnosis not present

## 2023-04-26 DIAGNOSIS — Z9181 History of falling: Secondary | ICD-10-CM | POA: Diagnosis not present

## 2023-04-26 LAB — CBC WITH DIFFERENTIAL/PLATELET
Abs Immature Granulocytes: 0.03 10*3/uL (ref 0.00–0.07)
Basophils Absolute: 0 10*3/uL (ref 0.0–0.1)
Basophils Relative: 1 %
Eosinophils Absolute: 0.2 10*3/uL (ref 0.0–0.5)
Eosinophils Relative: 3 %
HCT: 30.4 % — ABNORMAL LOW (ref 36.0–46.0)
Hemoglobin: 10 g/dL — ABNORMAL LOW (ref 12.0–15.0)
Immature Granulocytes: 0 %
Lymphocytes Relative: 14 %
Lymphs Abs: 1 10*3/uL (ref 0.7–4.0)
MCH: 32.4 pg (ref 26.0–34.0)
MCHC: 32.9 g/dL (ref 30.0–36.0)
MCV: 98.4 fL (ref 80.0–100.0)
Monocytes Absolute: 0.4 10*3/uL (ref 0.1–1.0)
Monocytes Relative: 6 %
Neutro Abs: 5.6 10*3/uL (ref 1.7–7.7)
Neutrophils Relative %: 76 %
Platelets: 248 10*3/uL (ref 150–400)
RBC: 3.09 MIL/uL — ABNORMAL LOW (ref 3.87–5.11)
RDW: 15 % (ref 11.5–15.5)
WBC: 7.3 10*3/uL (ref 4.0–10.5)
nRBC: 0 % (ref 0.0–0.2)

## 2023-04-26 LAB — BASIC METABOLIC PANEL
Anion gap: 9 (ref 5–15)
BUN: 24 mg/dL — ABNORMAL HIGH (ref 8–23)
CO2: 23 mmol/L (ref 22–32)
Calcium: 9.3 mg/dL (ref 8.9–10.3)
Chloride: 108 mmol/L (ref 98–111)
Creatinine, Ser: 1.55 mg/dL — ABNORMAL HIGH (ref 0.44–1.00)
GFR, Estimated: 33 mL/min — ABNORMAL LOW (ref >60)
Glucose, Bld: 124 mg/dL — ABNORMAL HIGH (ref 70–99)
Potassium: 3.3 mmol/L — ABNORMAL LOW (ref 3.5–5.1)
Sodium: 140 mmol/L (ref 135–145)

## 2023-04-26 MED ORDER — POTASSIUM CHLORIDE CRYS ER 20 MEQ PO TBCR
20.0000 meq | EXTENDED_RELEASE_TABLET | Freq: Once | ORAL | Status: AC
  Administered 2023-04-26: 20 meq via ORAL
  Filled 2023-04-26: qty 1

## 2023-04-26 NOTE — ED Triage Notes (Signed)
Patient to ED via POV for headache and hematoma on right arm- first notice today. Headache since Monday when released from hospital after stent placement. Also stating burning in throat after coughing about 15 minutes ago. NAD noted. Denies CP

## 2023-04-26 NOTE — Discharge Instructions (Addendum)
Please follow closely with your primary doctor.  Return to the ER right away if you experience chest pain difficulty breathing difficulty swallowing, severe headache or worsening concerns arise.

## 2023-04-26 NOTE — ED Provider Notes (Signed)
[facility] Provider Note    Event Date/Time   First MD Initiated Contact with Patient 04/26/23 1257     (approximate)   History   Headache   HPI  Christine Burnett is a 82 y.o. female history of recent right internal carotid artery stenosis   Recent stenting procedure.   Noticed today that she has some swelling and bruising on the right upper forearm.  Daughter reports that maybe this is where an IV was started previous.  She also has bruising from the blood pressure cuff on both of her arms during previous hospitalization  She has had a mild headache since the time of her hospitalization she advises that is unchanged.  No new numbness no weakness no difficulty beaking.  No interim falls or injuries.  She also relates that she noticed that she was having a little bit of soreness of her throat or scratchiness in her throat while she was driving here to be evaluated for the suspected "hematoma" on right forearm.  The scratchiness of the throat has gone away.  She is breathing normally   Physical Exam   Triage Vital Signs: ED Triage Vitals [04/26/23 1128]  Encounter Vitals Group     BP (!) 158/83     Systolic BP Percentile      Diastolic BP Percentile      Pulse Rate 97     Resp 18     Temp 98.6 F (37 C)     Temp Source Oral     SpO2 100 %     Weight 135 lb (61.2 kg)     Height 5\' 7"  (1.702 m)     Head Circumference      Peak Flow      Pain Score 9     Pain Loc      Pain Education      Exclude from Growth Chart     Most recent vital signs: Vitals:   04/26/23 1300 04/26/23 1407  BP: (!) 167/87 (!) 163/76  Pulse: 88 86  Resp: 14 17  Temp:  97.9 F (36.6 C)  SpO2: 100% 99%     General: Awake, no distress.  Normocephalic atraumatic contusion over the right forehead, patient and daughter report this is occurred during her last hospitalization and is old.  CV:  Good peripheral perfusion. Normal.  Resp:  Normal effort.  Abd:  No  distention.  Other:  A very small area of slight bruising and minimal induration is noted over the right forearm close to the antecubital space.  No surrounding erythema, no fluctuance, no warmth.   ED Results / Procedures / Treatments   Labs (all labs ordered are listed, but only abnormal results are displayed) Labs Reviewed  CBC WITH DIFFERENTIAL/PLATELET - Abnormal; Notable for the following components:      Result Value   RBC 3.09 (*)    Hemoglobin 10.0 (*)    HCT 30.4 (*)    All other components within normal limits  BASIC METABOLIC PANEL - Abnormal; Notable for the following components:   Potassium 3.3 (*)    Glucose, Bld 124 (*)    BUN 24 (*)    Creatinine, Ser 1.55 (*)    GFR, Estimated 33 (*)    All other components within normal limits   All labs demonstrate hemoglobin of 10.  Improving.  Mild-moderate chronic renal disease.   EKG     RADIOLOGY     PROCEDURES:  Critical Care  performed: No  Procedures   MEDICATIONS ORDERED IN ED: Medications  potassium chloride SA (KLOR-CON M) CR tablet 20 mEq (20 mEq Oral Given 04/26/23 1324)     IMPRESSION / MDM / ASSESSMENT AND PLAN / ED COURSE  I reviewed the triage vital signs and the nursing notes.                              Differential diagnosis includes, but is not limited to,   Patient's presentation is most consistent with acute complicated illness / injury requiring diagnostic workup.        Exam findings consistent with hematoma over the right forearm.  Is not appear to be acute complication.  Is relatively small.  Has ecchymoses that appears more consistent with hematomas provide been present for a few days.  Has a small punctate hole in it and I suspect this is likely from a previous attempted venous access during prior hospitalization.  Patient also related that while she was driving here from the hospital she felt like she was having little bit of a sore throat or scratchy throat but that is  now completely resolved on its own.  Oral pharyngeal chest neurologic examination is reassuring.  Return precautions and treatment recommendations and follow-up discussed with the patient who is agreeable with the plan.   FINAL CLINICAL IMPRESSION(S) / ED DIAGNOSES   Final diagnoses:  Hematoma of arm, right, initial encounter     Rx / DC Orders   ED Discharge Orders     None        Note:  This document was prepared using Dragon voice recognition software and may include unintentional dictation errors.   Sharyn Creamer, MD 05/09/23 2137

## 2023-04-30 ENCOUNTER — Encounter: Payer: Self-pay | Admitting: Physician Assistant

## 2023-04-30 ENCOUNTER — Telehealth: Payer: Self-pay | Admitting: Family Medicine

## 2023-04-30 ENCOUNTER — Ambulatory Visit (INDEPENDENT_AMBULATORY_CARE_PROVIDER_SITE_OTHER): Payer: Medicare Other | Admitting: Physician Assistant

## 2023-04-30 VITALS — BP 128/74 | HR 105 | Resp 16 | Ht 67.0 in | Wt 134.7 lb

## 2023-04-30 DIAGNOSIS — Z09 Encounter for follow-up examination after completed treatment for conditions other than malignant neoplasm: Secondary | ICD-10-CM | POA: Diagnosis not present

## 2023-04-30 DIAGNOSIS — N1832 Chronic kidney disease, stage 3b: Secondary | ICD-10-CM | POA: Diagnosis not present

## 2023-04-30 DIAGNOSIS — G459 Transient cerebral ischemic attack, unspecified: Secondary | ICD-10-CM | POA: Diagnosis not present

## 2023-04-30 DIAGNOSIS — D508 Other iron deficiency anemias: Secondary | ICD-10-CM

## 2023-04-30 DIAGNOSIS — Z48812 Encounter for surgical aftercare following surgery on the circulatory system: Secondary | ICD-10-CM | POA: Diagnosis not present

## 2023-04-30 DIAGNOSIS — I6521 Occlusion and stenosis of right carotid artery: Secondary | ICD-10-CM | POA: Diagnosis not present

## 2023-04-30 DIAGNOSIS — I10 Essential (primary) hypertension: Secondary | ICD-10-CM | POA: Diagnosis not present

## 2023-04-30 DIAGNOSIS — J449 Chronic obstructive pulmonary disease, unspecified: Secondary | ICD-10-CM | POA: Diagnosis not present

## 2023-04-30 DIAGNOSIS — I129 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 stage 4 chronic kidney disease, or unspecified chronic kidney disease: Secondary | ICD-10-CM | POA: Diagnosis not present

## 2023-04-30 DIAGNOSIS — N179 Acute kidney failure, unspecified: Secondary | ICD-10-CM | POA: Diagnosis not present

## 2023-04-30 DIAGNOSIS — D631 Anemia in chronic kidney disease: Secondary | ICD-10-CM | POA: Diagnosis not present

## 2023-04-30 NOTE — Telephone Encounter (Signed)
No answer from [facility], left detailed vm

## 2023-04-30 NOTE — Telephone Encounter (Signed)
Home Health Verbal Orders - Caller/Agency: [facility] Number: [phone] Service Requested: Physical Therapy Frequency:  1w2 2w3 1w2 Any new concerns about the patient? No  Wants to clarify whether patient is currently taking calcium and using her nebulizer (according to med list)

## 2023-04-30 NOTE — Progress Notes (Unsigned)
Established Patient Office Visit  Name: Christine Burnett   MRN: [id number]    DOB: 08/14/1940   Date:05/02/2023  Today's Provider: Jacquelin Hawking, MHS, PA-C Introduced myself to the patient as a PA-C and provided education on APPs in clinical practice.         Subjective  Chief Complaint  Chief Complaint  Patient presents with   Hospitalization Follow-up   Headache    Continues on/off aches    HPI  She reports persistent headache along her right temple  She thinks it is not present when she is laying down but is more prevalent when she looks down  She reports her bandages came off last night in the shower and she has not noticed any bleeding or wound changes  She reports she is having dark stools- maybe black  She is on ferrous sulfate for iron supplementation  Her companion states she hit her head while in the hospital and did not have CT scan head prior to discharge- they state this happened the day before she was discharged?- light bruising present along temple  They deny LOC, nausea, vomiting, dizziness   Transition of Care Hospital Follow up.   Hospital/Facility:[facility] D/C Physician: Shonna Chock, MD  D/C Date: 04/23/23  Records Requested:  Records Received:  Records Reviewed:   Diagnoses on Discharge:  Principal Problem:   Stenosis of right internal carotid artery Active Problems:   TIA (transient ischemic attack)   Chest pain   Essential hypertension   Dyslipidemia   Anxiety and depression   Chronic obstructive pulmonary disease (COPD) (HCC)   Internal carotid artery stenosis, right   Hospital course  11/12: admitted to hospitalist service for TIA w/u. MRI brain concerning for possible tiny acute ischemic infarcts and chronic hemorrhage in the anterior right frontal lobe, fairly cortical. Pending Echo and carotid US.  11/13: Per neurology, punctate possible strokes on MRI do not fit the clinical history provided, therefore favor these to be artifact  and proceeding w/ EEG, MRA H/N. Echo no evidence for LA enlargement no need for Zio monitor. Seizure precautions, initiating lamotrigine and d/c BuSpar.  11/14: substantial atherosclerotic disease on MRA, CTA H/N especially involving  right internal carotid artery - high concern that her left leg weakness spells were limb shaking TIA. Vascular team here to see her to discuss intervention  11/15: angio w/ stent to R innominate artery. Hgb 12.0. 11/16: doing well postop, moved out of ICU, BP running low. Hgb to 9.3. Per vascular surgery ok to discharge home when medically stable on ASA/Brilinta and f/u 2 weeks - monitoring overnight d/t lower BP  11/17: Hgb down to 7.6 w/ fatigue. No s/s GI/GU bleeding, she does have ecchymosis to R groin at cath site but no pain. CT C/A/P no retroperitoneal bleed or other obvious bleeding. 1 unit PRBC, repeat HH pending. Hemoccult pending, consult to GI.     Date of interactive Contact within 48 hours of discharge: 04/24/23 Contact was through: phone  Date of 7 day or 14 day face-to-face visit:  04/30/23  within 7 days  Outpatient Encounter Medications as of 04/30/2023  Medication Sig Note   albuterol (VENTOLIN HFA) 108 (90 Base) MCG/ACT inhaler Inhale 2 puffs into the lungs every 4 (four) hours as needed for wheezing or shortness of breath.    aspirin 81 MG EC tablet Take 81 mg by mouth at bedtime. Swallow whole.    atorvastatin (LIPITOR) 40 MG tablet Take  1 tablet (40 mg total) by mouth at bedtime.    Calcium 200 MG TABS Take 300 mg by mouth every Monday, Tuesday, Wednesday, Thursday, and Friday. 04/24/2023: See TUMS   calcium carbonate (TUMS - DOSED IN MG ELEMENTAL CALCIUM) 500 MG chewable tablet Chew 1.5 tablets (300 mg of elemental calcium total) by mouth every Monday, Tuesday, Wednesday, Thursday, and Friday.    Cholecalciferol (VITAMIN D3) 50 MCG (2000 UT) TABS Take 2,000 Units by mouth at bedtime.    ferrous sulfate 325 (65 FE) MG EC tablet Take 1 tablet  (325 mg total) by mouth every other day.    fluticasone furoate-vilanterol (BREO ELLIPTA) 100-25 MCG/ACT AEPB Inhale 1 puff into the lungs daily.    melatonin 5 MG TABS Take 0.5 tablets (2.5 mg total) by mouth at bedtime. 04/24/2023: Takes 10 mg nightly   Multiple Vitamin (MULTIVITAMIN) capsule Take 1 capsule by mouth daily. 04/24/2023: Uses gummies   PARoxetine (PAXIL) 40 MG tablet Take 1 tablet (40 mg total) by mouth every morning.    senna-docusate (SENOKOT-S) 8.6-50 MG tablet Take 1 tablet by mouth at bedtime as needed for moderate constipation.    ticagrelor (BRILINTA) 90 MG TABS tablet Take 1 tablet (90 mg total) by mouth 2 (two) times daily.    albuterol (PROVENTIL) (2.5 MG/3ML) 0.083% nebulizer solution Take 3 mLs (2.5 mg total) by nebulization every 4 (four) hours as needed for shortness of breath or wheezing. (Patient not taking: Reported on 04/24/2023) 04/24/2023: Can not use- increased anxiety   busPIRone (BUSPAR) 5 MG tablet Take 1 tablet (5 mg total) by mouth 2 (two) times daily as needed (stress anxiety). (Patient not taking: Reported on 04/24/2023) 04/24/2023: Not using    melatonin 3 MG TABS tablet Take 3 mg by mouth at bedtime. (Patient not taking: Reported on 04/24/2023)    No facility-administered encounter medications on file as of 04/30/2023.    Diagnostic Tests Reviewed/Disposition:   Procedures: 04/20/23: PROCEDURE:  Ultrasound guidance for vascular access right femoral artery Percutaneous transluminal angioplasty of the right iliac artery with 6 mm diameter by 8 cm length Lutonix drug-coated angioplasty balloon Thoracic aortogram Left cervical and cerebral carotid angiogram Right cervical and cerebral carotid angiogram Stent placement to the right innominate artery with the use of the Nav 6 embolic protection device with a 12 mm diameter by 38 mm length Lifestream stent.  Consults: Neurology, Vascular surgery   Discharge Instructions:   Recommendations from DC  Summary  Pcp f/u, consider check of hemoglobin at f/u, also attention to BP at f/u Vascular surgery f/u 2 weeks (Dr. Wyn Quaker)   Disease/illness Education:  Home Health/Community Services Discussions/Referrals: Home health PT ordered- verbal orders provided   Establishment or re-establishment of referral orders for community resources: has apt with Vascular and home health PT orders have been placed   Discussion with other health care providers: None   Assessment and Support of treatment regimen adherence:  Appointments Coordinated with: patient has upcoming apt with Vascular   Education for self-management, independent living, and ADLs: Home health PT coming to home       Patient Active Problem List   Diagnosis Date Noted   Stenosis of right internal carotid artery 04/19/2023   Internal carotid artery stenosis, right 04/19/2023   Essential hypertension 04/18/2023   Dyslipidemia 04/18/2023   Anxiety and depression 04/18/2023   Chronic obstructive pulmonary disease (COPD) (HCC) 04/18/2023   TIA (transient ischemic attack) 04/17/2023   Inferior pubic ramus fracture, left, closed, initial encounter (HCC)  11/01/2022   Fall 11/01/2022   Closed fracture of left inferior pubic ramus, initial encounter (HCC) 11/01/2022   Sacral fracture, closed (HCC) 11/01/2022   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   Dyspnea on exertion 05/15/2022   Coronary artery disease    COPD (chronic obstructive pulmonary disease) (HCC) 01/28/2021   CKD (chronic kidney disease) stage 4, GFR 15-29 ml/min (HCC) 01/21/2021   Goals of care, counseling/discussion 07/17/2020   Malignant neoplasm of lower lobe of right lung (HCC) 07/04/2020   Nocturnal hypoxia 03/17/2020   Prediabetes 08/13/2018   Chest pain 09/27/2017   Allergic rhinitis 09/27/2017   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   Stage 3b chronic kidney disease (HCC) 08/31/2017   Hyperparathyroidism, secondary renal (HCC)  08/31/2017   Osteoporosis without current pathological fracture 03/30/2017   Cardiac murmur 04/07/2015   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 stage 4 chronic kidney disease, or unspecified chronic kidney disease 02/10/2015   Anxiety 01/05/2015   Hyperlipidemia 01/05/2015   HTN (hypertension) 09/24/2014    Past Surgical History:  Procedure Laterality Date   CAROTID PTA/STENT INTERVENTION Right 04/20/2023   Procedure: CAROTID PTA/STENT INTERVENTION;  Surgeon: Annice Needy, MD;  Location: [facility]IVE [facility];  Service: Cardiovascular;  Laterality: Right;  Inominate Artery Stenosis requiring stenting   COLONOSCOPY  2016   ESOPHAGUS SURGERY     INTRAMEDULLARY (IM) NAIL INTERTROCHANTERIC Left 03/26/2017   Procedure: INTRAMEDULLARY (IM) NAIL INTERTROCHANTRIC;  Surgeon: Kennedy Bucker, MD;  Location: [facility] OR[facility];  Service: Orthopedics;  Laterality: Left;   LEFT HEART CATH AND CORONARY ANGIOGRAPHY N/A 04/15/2021   Procedure: LEFT HEART CATH AND CORONARY ANGIOGRAPHY;  Surgeon: Iran Ouch, MD;  Location: [facility];  Service: Cardiovascular;  Laterality: N/A;   RIGHT/LEFT HEART CATH AND CORONARY ANGIOGRAPHY Bilateral 05/15/2022   Procedure: RIGHT/LEFT HEART CATH AND CORONARY ANGIOGRAPHY;  Surgeon: Iran Ouch, MD;  Location: [facility];  Service: Cardiovascular;  Laterality: Bilateral;   VIDEO BRONCHOSCOPY WITH ENDOBRONCHIAL NAVIGATION Right 06/28/2020   Procedure: VIDEO BRONCHOSCOPY WITH ENDOBRONCHIAL NAVIGATION CELLVIZIO;  Surgeon: Salena Saner, MD;  Location: [facility] OR[facility];  Service: Pulmonary;  Laterality: Right;    Family History  Problem Relation Age of Onset   Other Mother        Ureme[place] posioning   Kidney disease Mother    Heart attack Father    Prostate cancer Father    Cancer Father    Heart disease Father    Heart attack Brother    Diabetes Brother    Hypertension Daughter    Kidney cancer Neg Hx    Bladder Cancer Neg Hx     Colon cancer Neg Hx    Colon polyps Neg Hx    Rectal cancer Neg Hx     Social History   Tobacco Use   Smoking status: Former    Current packs/day: 0.00    Average packs/day: 1 pack/day for 61.0 years (61.0 ttl pk-yrs)    Types: Cigarettes    Start date: 03/25/1956    Quit date: 03/25/2017    Years since quitting: 6.1   Smokeless tobacco: Never  Substance Use Topics   Alcohol use: No     Current Outpatient Medications:    albuterol (VENTOLIN HFA) 108 (90 Base) MCG/ACT inhaler, Inhale 2 puffs into the lungs every 4 (four) hours as needed for wheezing or shortness of breath., Disp: 8 g, Rfl: 1   aspirin 81 MG EC tablet, Take 81  mg by mouth at bedtime. Swallow whole., Disp: , Rfl:    atorvastatin (LIPITOR) 40 MG tablet, Take 1 tablet (40 mg total) by mouth at bedtime., Disp: 90 tablet, Rfl: 1   Calcium 200 MG TABS, Take 300 mg by mouth every Monday, Tuesday, Wednesday, Thursday, and Friday., Disp: , Rfl:    calcium carbonate (TUMS - DOSED IN MG ELEMENTAL CALCIUM) 500 MG chewable tablet, Chew 1.5 tablets (300 mg of elemental calcium total) by mouth every Monday, Tuesday, Wednesday, Thursday, and Friday., Disp: , Rfl:    Cholecalciferol (VITAMIN D3) 50 MCG (2000 UT) TABS, Take 2,000 Units by mouth at bedtime., Disp: , Rfl:    ferrous sulfate 325 (65 FE) MG EC tablet, Take 1 tablet (325 mg total) by mouth every other day., Disp: 60 tablet, Rfl: 0   fluticasone furoate-vilanterol (BREO ELLIPTA) 100-25 MCG/ACT AEPB, Inhale 1 puff into the lungs daily., Disp: 1 each, Rfl: 11   melatonin 5 MG TABS, Take 0.5 tablets (2.5 mg total) by mouth at bedtime., Disp: , Rfl:    Multiple Vitamin (MULTIVITAMIN) capsule, Take 1 capsule by mouth daily., Disp: , Rfl:    PARoxetine (PAXIL) 40 MG tablet, Take 1 tablet (40 mg total) by mouth every morning., Disp: 90 tablet, Rfl: 2   senna-docusate (SENOKOT-S) 8.6-50 MG tablet, Take 1 tablet by mouth at bedtime as needed for moderate constipation., Disp: 30  tablet, Rfl: 1   ticagrelor (BRILINTA) 90 MG TABS tablet, Take 1 tablet (90 mg total) by mouth 2 (two) times daily., Disp: 60 tablet, Rfl: 1   albuterol (PROVENTIL) (2.5 MG/3ML) 0.083% nebulizer solution, Take 3 mLs (2.5 mg total) by nebulization every 4 (four) hours as needed for shortness of breath or wheezing. (Patient not taking: Reported on 04/24/2023), Disp: , Rfl:    busPIRone (BUSPAR) 5 MG tablet, Take 1 tablet (5 mg total) by mouth 2 (two) times daily as needed (stress anxiety). (Patient not taking: Reported on 04/24/2023), Disp: 60 tablet, Rfl: 1   melatonin 3 MG TABS tablet, Take 3 mg by mouth at bedtime. (Patient not taking: Reported on 04/24/2023), Disp: , Rfl:   Allergies  Allergen Reactions   Zithromax [Azithromycin]    Cefuroxime Axetil Rash   Ciprofloxacin Nausea Only    Unknown   Codeine Other (See Comments)    Patient does not like how the medication makes you feel     I personally reviewed active problem list, medication list, allergies, health maintenance, notes from last encounter, lab results, imaging with the patient/caregiver today.   ROS    Objective  Vitals:   04/30/23 1415 04/30/23 1455  BP: (!) 146/84 128/74  Pulse: (!) 105   Resp: 16   SpO2: 97%   Weight: 134 lb 11.2 oz (61.1 kg)   Height: 5\' 7"  (1.702 m)     Body mass index is 21.1 kg/m.  Physical Exam Vitals reviewed.  Constitutional:      General: She is awake.     Appearance: Normal appearance. She is well-developed and well-groomed.  HENT:     Head: Normocephalic. Contusion present.   Eyes:     General: Lids are normal. Gaze aligned appropriately.     Extraocular Movements: Extraocular movements intact.     Conjunctiva/sclera: Conjunctivae normal.     Pupils: Pupils are equal, round, and reactive to light.  Cardiovascular:     Rate and Rhythm: Regular rhythm. Tachycardia present.     Pulses: Normal pulses.  Radial pulses are 2+ on the right side and 2+ on the left side.      Heart sounds: Normal heart sounds. No murmur heard.    No friction rub. No gallop.  Pulmonary:     Effort: Pulmonary effort is normal. No tachypnea.     Breath sounds: Normal breath sounds. No decreased air movement. No decreased breath sounds, wheezing, rhonchi or rales.  Musculoskeletal:     Cervical back: Normal range of motion and neck supple.     Right lower leg: No edema.     Left lower leg: No edema.  Skin:    Findings: Ecchymosis present.  Neurological:     Mental Status: She is alert.  Psychiatric:        Behavior: Behavior is cooperative.      Recent Results (from the past 2160 hour(s))  Basic metabolic panel     Status: Abnormal   Collection Time: 04/17/23  6:43 PM  Result Value Ref Range   Sodium 136 135 - 145 mmol/L   Potassium 4.0 3.5 - 5.1 mmol/L   Chloride 103 98 - 111 mmol/L   CO2 24 22 - 32 mmol/L   Glucose, Bld 101 (H) 70 - 99 mg/dL    Comment: Glucose reference range applies only to samples taken after fasting for at least 8 hours.   BUN 27 (H) 8 - 23 mg/dL   Creatinine, Ser 4.09 (H) 0.44 - 1.00 mg/dL   Calcium 9.2 8.9 - 81.1 mg/dL   GFR, Estimated 34 (L) >60 mL/min    Comment: (NOTE) Calculated using the CKD-EPI Creatinine Equation (2021)    Anion gap 9 5 - 15    Comment: Performed at [facility], [address] [place], [place] [address]  CBC     Status: Abnormal   Collection Time: 04/17/23  6:43 PM  Result Value Ref Range   WBC 5.5 4.0 - 10.5 K/uL   RBC 3.59 (L) 3.87 - 5.11 MIL/uL   Hemoglobin 12.1 12.0 - 15.0 g/dL   HCT 29.5 62.1 - 30.8 %   MCV 101.7 (H) 80.0 - 100.0 fL   MCH 33.7 26.0 - 34.0 pg   MCHC 33.2 30.0 - 36.0 g/dL   RDW 65.7 84.6 - 96.2 %   Platelets 199 150 - 400 K/uL   nRBC 0.0 0.0 - 0.2 %    Comment: Performed at [facility], [address], [place], [place] [address]  Troponin I (High Sensitivity)     Status: Abnormal   Collection Time: 04/17/23  6:43 PM  Result Value Ref Range   Troponin I (High  Sensitivity) 22 (H) <18 ng/L    Comment: (NOTE) Elevated high sensitivity troponin I (hsTnI) values and significant  changes across serial measurements may suggest ACS but many other  chronic and acute conditions are known to elevate hsTnI results.  Refer to the "Links" section for chest pain algorithms and additional  guidance. Performed at [facility], [address] [place], [place] [address]   Hemoglobin A1c     Status: None   Collection Time: 04/17/23  6:43 PM  Result Value Ref Range   Hgb A1c MFr Bld 5.4 4.8 - 5.6 %    Comment: (NOTE) Pre diabetes:          5.7%-6.4%  Diabetes:              >6.4%  Glycemic control for   <7.0% adults with diabetes    Mean Plasma  Glucose 108.28 mg/dL    Comment: Performed at [facility], [address], [place], [place] [address]  Troponin I (High Sensitivity)     Status: Abnormal   Collection Time: 04/17/23  9:14 PM  Result Value Ref Range   Troponin I (High Sensitivity) 28 (H) <18 ng/L    Comment: (NOTE) Elevated high sensitivity troponin I (hsTnI) values and significant  changes across serial measurements may suggest ACS but many other  chronic and acute conditions are known to elevate hsTnI results.  Refer to the "Links" section for chest pain algorithms and additional  guidance. Performed at [facility], [address] [place], [place] [address]   Urinalysis, Routine w reflex microscopic -Urine, Clean Catch     Status: Abnormal   Collection Time: 04/17/23  9:33 PM  Result Value Ref Range   Color, Urine YELLOW (A) YELLOW   APPearance CLEAR (A) CLEAR   Specific Gravity, Urine 1.013 1.005 - 1.030   pH 6.0 5.0 - 8.0   Glucose, UA NEGATIVE NEGATIVE mg/dL   Hgb urine dipstick NEGATIVE NEGATIVE   Bilirubin Urine NEGATIVE NEGATIVE   Ketones, ur NEGATIVE NEGATIVE mg/dL   Protein, ur NEGATIVE NEGATIVE mg/dL   Nitrite NEGATIVE NEGATIVE   Leukocytes,Ua NEGATIVE NEGATIVE    Comment: Performed at [facility], [address] [place], [place] [address]  Lipid panel     Status: None   Collection Time: 04/18/23  4:54 AM  Result Value Ref Range   Cholesterol 103 0 - 200 mg/dL   Triglycerides 95 <657 mg/dL   HDL 42 >84 mg/dL   Total CHOL/HDL Ratio 2.5 RATIO   VLDL 19 0 - 40 mg/dL   LDL Cholesterol 42 0 - 99 mg/dL    Comment:        Total Cholesterol/HDL:CHD Risk Coronary Heart Disease Risk Table                     Men   Women  1/2 Average Risk   3.4   3.3  Average Risk       5.0   4.4  2 X Average Risk   9.6   7.1  3 X Average Risk  23.4   11.0        Use the calculated Patient Ratio above and the CHD Risk Table to determine the patient's CHD Risk.        ATP III CLASSIFICATION (LDL):  <100     mg/dL   Optimal  696-295  mg/dL   Near or Above                    Optimal  130-159  mg/dL   Borderline  284-132  mg/dL   High  >440     mg/dL   Very High Performed at [facility], [address] [place], [place] [address]   ECHOCARDIOGRAM COMPLETE BUBBLE STUDY     Status: None   Collection Time: 04/18/23  8:15 AM  Result Value Ref Range   Ao pk vel 1.33 m/s   AV Area VTI 2.26 cm2   AR max vel 1.84 cm2   AV Mean grad 4.3 mmHg   AV Peak grad 7.0 mmHg   S' Lateral 2.40 cm   AV Area mean vel 1.76 cm2   Area-P 1/2 8.62 cm2   MV VTI 2.27 cm2   Est EF 55 - 60%   Basic metabolic panel  Status: Abnormal   Collection Time: 04/19/23  8:38 AM  Result Value Ref Range   Sodium 136 135 - 145 mmol/L   Potassium 4.3 3.5 - 5.1 mmol/L   Chloride 102 98 - 111 mmol/L   CO2 26 22 - 32 mmol/L   Glucose, Bld 106 (H) 70 - 99 mg/dL    Comment: Glucose reference range applies only to samples taken after fasting for at least 8 hours.   BUN 28 (H) 8 - 23 mg/dL   Creatinine, Ser 1.30 (H) 0.44 - 1.00 mg/dL   Calcium 9.4 8.9 - 86.5 mg/dL   GFR, Estimated 28 (L) >60 mL/min    Comment: (NOTE) Calculated using the CKD-EPI Creatinine Equation (2021)    Anion gap 8 5 - 15     Comment: Performed at [facility], [address] [place], [place] [address]  CBC     Status: Abnormal   Collection Time: 04/20/23  4:38 AM  Result Value Ref Range   WBC 5.9 4.0 - 10.5 K/uL   RBC 3.65 (L) 3.87 - 5.11 MIL/uL   Hemoglobin 12.0 12.0 - 15.0 g/dL   HCT 62.9 (L) 52.8 - 41.3 %   MCV 97.8 80.0 - 100.0 fL   MCH 32.9 26.0 - 34.0 pg   MCHC 33.6 30.0 - 36.0 g/dL   RDW 24.4 01.0 - 27.2 %   Platelets 200 150 - 400 K/uL   nRBC 0.0 0.0 - 0.2 %    Comment: Performed at [facility], [address], [place], [place] [address]  Basic metabolic panel     Status: Abnormal   Collection Time: 04/20/23  4:38 AM  Result Value Ref Range   Sodium 142 135 - 145 mmol/L   Potassium 4.5 3.5 - 5.1 mmol/L   Chloride 106 98 - 111 mmol/L   CO2 26 22 - 32 mmol/L   Glucose, Bld 108 (H) 70 - 99 mg/dL    Comment: Glucose reference range applies only to samples taken after fasting for at least 8 hours.   BUN 27 (H) 8 - 23 mg/dL   Creatinine, Ser 4.03 (H) 0.44 - 1.00 mg/dL   Calcium 9.5 8.9 - 47.4 mg/dL   GFR, Estimated 28 (L) >60 mL/min    Comment: (NOTE) Calculated using the CKD-EPI Creatinine Equation (2021)    Anion gap 10 5 - 15    Comment: Performed at [facility], [address] [place], [place] [address]  POCT Activated clotting time     Status: None   Collection Time: 04/20/23 10:15 AM  Result Value Ref Range   Activated Clotting Time 245 seconds    Comment: Reference range 74-137 seconds for patients not on anticoagulant therapy.  Glucose, capillary     Status: Abnormal   Collection Time: 04/20/23  5:22 PM  Result Value Ref Range   Glucose-Capillary 110 (H) 70 - 99 mg/dL    Comment: Glucose reference range applies only to samples taken after fasting for at least 8 hours.  MRSA Next Gen by PCR, Nasal     Status: None   Collection Time: 04/20/23  5:24 PM   Specimen: Nasal Mucosa; Nasal Swab  Result Value Ref Range   MRSA by PCR Next Gen NOT DETECTED  NOT DETECTED    Comment: (NOTE) The GeneXpert MRSA Assay (FDA approved for NASAL specimens only), is one component of a comprehensive MRSA colonization surveillance program. It is not intended to diagnose MRSA infection nor to guide or  monitor treatment for MRSA infections. Test performance is not FDA approved in patients less than 35 years old. Performed at [facility], [address] [place], [place] [address]   CBC     Status: Abnormal   Collection Time: 04/21/23  3:42 AM  Result Value Ref Range   WBC 9.3 4.0 - 10.5 K/uL   RBC 2.81 (L) 3.87 - 5.11 MIL/uL   Hemoglobin 9.3 (L) 12.0 - 15.0 g/dL   HCT 60.4 (L) 54.0 - 98.1 %   MCV 103.9 (H) 80.0 - 100.0 fL   MCH 33.1 26.0 - 34.0 pg   MCHC 31.8 30.0 - 36.0 g/dL   RDW 19.1 47.8 - 29.5 %   Platelets 172 150 - 400 K/uL   nRBC 0.0 0.0 - 0.2 %    Comment: Performed at [facility], [address], [place], [place] [address]  Basic metabolic panel     Status: Abnormal   Collection Time: 04/21/23  3:42 AM  Result Value Ref Range   Sodium 136 135 - 145 mmol/L   Potassium 4.5 3.5 - 5.1 mmol/L   Chloride 106 98 - 111 mmol/L   CO2 23 22 - 32 mmol/L   Glucose, Bld 133 (H) 70 - 99 mg/dL    Comment: Glucose reference range applies only to samples taken after fasting for at least 8 hours.   BUN 25 (H) 8 - 23 mg/dL   Creatinine, Ser 8.65 (H) 0.44 - 1.00 mg/dL   Calcium 8.7 (L) 8.9 - 10.3 mg/dL   GFR, Estimated 35 (L) >60 mL/min    Comment: (NOTE) Calculated using the CKD-EPI Creatinine Equation (2021)    Anion gap 7 5 - 15    Comment: Performed at [facility], [address] [place], [place] [address]  CBC     Status: Abnormal   Collection Time: 04/22/23  5:13 AM  Result Value Ref Range   WBC 7.6 4.0 - 10.5 K/uL   RBC 2.26 (L) 3.87 - 5.11 MIL/uL   Hemoglobin 7.6 (L) 12.0 - 15.0 g/dL   HCT 62.9 (L) 52.8 - 41.3 %   MCV 103.1 (H) 80.0 - 100.0 fL   MCH 33.6 26.0 - 34.0 pg   MCHC 32.6 30.0 - 36.0 g/dL    RDW 24.4 01.0 - 27.2 %   Platelets 132 (L) 150 - 400 K/uL   nRBC 0.0 0.0 - 0.2 %    Comment: Performed at [facility], [address] [place], [place] [address]  Basic metabolic panel     Status: Abnormal   Collection Time: 04/22/23  5:13 AM  Result Value Ref Range   Sodium 135 135 - 145 mmol/L   Potassium 3.9 3.5 - 5.1 mmol/L   Chloride 105 98 - 111 mmol/L   CO2 23 22 - 32 mmol/L   Glucose, Bld 109 (H) 70 - 99 mg/dL    Comment: Glucose reference range applies only to samples taken after fasting for at least 8 hours.   BUN 23 8 - 23 mg/dL   Creatinine, Ser 4.03 (H) 0.44 - 1.00 mg/dL   Calcium 8.7 (L) 8.9 - 10.3 mg/dL   GFR, Estimated 31 (L) >60 mL/min    Comment: (NOTE) Calculated using the CKD-EPI Creatinine Equation (2021)    Anion gap 7 5 - 15    Comment: Performed at [facility], [address], [place], [place] [address]  Prepare RBC (crossmatch)     Status: None  Collection Time: 04/22/23 10:10 AM  Result Value Ref Range   Order Confirmation      ORDER PROCESSED BY BLOOD BANK Performed at [facility], [address] [place], [place] [address]   Type and screen [facility]     Status: None   Collection Time: 04/22/23 11:14 AM  Result Value Ref Range   ABO/RH(D) O POS    Antibody Screen NEG    Sample Expiration 04/25/2023,2359    Unit Number [id number]    Blood Component Type RED CELLS,LR    Unit division 00    Status of Unit ISSUED,FINAL    Transfusion Status OK TO TRANSFUSE    Crossmatch Result      Compatible Performed at [facility], [address] [place], [place] [address]   BPAM RBC     Status: None   Collection Time: 04/22/23 11:14 AM  Result Value Ref Range   ISSUE DATE / TIME [id number]    Blood Product Unit Number [id number]    PRODUCT CODE [id number]    Unit Type and Rh 5100    Blood Product Expiration Date [id number]   Occult blood card to lab, stool     Status: None    Collection Time: 04/22/23 11:40 AM  Result Value Ref Range   Fecal Occult Bld NEGATIVE NEGATIVE    Comment: Performed at [facility], [address] [place], [place] [address]  Hemoglobin and hematocrit, blood     Status: Abnormal   Collection Time: 04/22/23  6:32 PM  Result Value Ref Range   Hemoglobin 9.1 (L) 12.0 - 15.0 g/dL   HCT 88.4 (L) 16.6 - 06.3 %    Comment: Performed at [facility], [address] [place], [place] [address]  CBC     Status: Abnormal   Collection Time: 04/23/23  4:52 AM  Result Value Ref Range   WBC 8.0 4.0 - 10.5 K/uL   RBC 2.67 (L) 3.87 - 5.11 MIL/uL   Hemoglobin 8.8 (L) 12.0 - 15.0 g/dL   HCT 09.3 (L) 23.5 - 57.3 %   MCV 97.8 80.0 - 100.0 fL    Comment: REPEATED TO VERIFY   MCH 33.0 26.0 - 34.0 pg   MCHC 33.7 30.0 - 36.0 g/dL   RDW 22.0 (H) 25.4 - 27.0 %   Platelets 147 (L) 150 - 400 K/uL   nRBC 0.0 0.0 - 0.2 %    Comment: Performed at [facility], [address] [place], [place] [address]  Basic metabolic panel     Status: Abnormal   Collection Time: 04/23/23  4:52 AM  Result Value Ref Range   Sodium 138 135 - 145 mmol/L   Potassium 3.6 3.5 - 5.1 mmol/L   Chloride 106 98 - 111 mmol/L   CO2 24 22 - 32 mmol/L   Glucose, Bld 96 70 - 99 mg/dL    Comment: Glucose reference range applies only to samples taken after fasting for at least 8 hours.   BUN 21 8 - 23 mg/dL   Creatinine, Ser 2.83 (H) 0.44 - 1.00 mg/dL   Calcium 8.7 (L) 8.9 - 10.3 mg/dL   GFR, Estimated 35 (L) >60 mL/min    Comment: (NOTE) Calculated using the CKD-EPI Creatinine Equation (2021)    Anion gap 8 5 - 15    Comment: Performed at [facility], [address], [place], [place] [address]  CBC with Differential     Status: Abnormal  Collection Time: 04/26/23 11:30 AM  Result Value Ref Range   WBC 7.3 4.0 - 10.5 K/uL   RBC 3.09 (L) 3.87 - 5.11 MIL/uL   Hemoglobin 10.0 (L) 12.0 - 15.0 g/dL   HCT 16.1 (L) 09.6 - 04.5 %   MCV 98.4  80.0 - 100.0 fL   MCH 32.4 26.0 - 34.0 pg   MCHC 32.9 30.0 - 36.0 g/dL   RDW 40.9 81.1 - 91.4 %   Platelets 248 150 - 400 K/uL   nRBC 0.0 0.0 - 0.2 %   Neutrophils Relative % 76 %   Neutro Abs 5.6 1.7 - 7.7 K/uL   Lymphocytes Relative 14 %   Lymphs Abs 1.0 0.7 - 4.0 K/uL   Monocytes Relative 6 %   Monocytes Absolute 0.4 0.1 - 1.0 K/uL   Eosinophils Relative 3 %   Eosinophils Absolute 0.2 0.0 - 0.5 K/uL   Basophils Relative 1 %   Basophils Absolute 0.0 0.0 - 0.1 K/uL   Immature Granulocytes 0 %   Abs Immature Granulocytes 0.03 0.00 - 0.07 K/uL    Comment: Performed at [facility], [address], [place], [place] [address]  Basic metabolic panel     Status: Abnormal   Collection Time: 04/26/23 11:30 AM  Result Value Ref Range   Sodium 140 135 - 145 mmol/L   Potassium 3.3 (L) 3.5 - 5.1 mmol/L   Chloride 108 98 - 111 mmol/L   CO2 23 22 - 32 mmol/L   Glucose, Bld 124 (H) 70 - 99 mg/dL    Comment: Glucose reference range applies only to samples taken after fasting for at least 8 hours.   BUN 24 (H) 8 - 23 mg/dL   Creatinine, Ser 6.21 (H) 0.44 - 1.00 mg/dL   Calcium 9.3 8.9 - 30.8 mg/dL   GFR, Estimated 33 (L) >60 mL/min    Comment: (NOTE) Calculated using the CKD-EPI Creatinine Equation (2021)    Anion gap 9 5 - 15    Comment: Performed at [facility], [address] [place], [place] [address]  CBC w/Diff/Platelet     Status: Abnormal   Collection Time: 04/30/23  2:57 PM  Result Value Ref Range   WBC 9.2 3.8 - 10.8 Thousand/uL   RBC 3.31 (L) 3.80 - 5.10 Million/uL   Hemoglobin 10.9 (L) 11.7 - 15.5 g/dL   HCT 69.6 (L) 29.5 - 28.4 %   MCV 99.1 80.0 - 100.0 fL   MCH 32.9 27.0 - 33.0 pg   MCHC 33.2 32.0 - 36.0 g/dL    Comment: For adults, a slight decrease in the calculated MCHC value (in the range of 30 to 32 g/dL) is most likely not clinically significant; however, it should be interpreted with caution in correlation with other red cell parameters  and the patient's clinical condition.    RDW 13.7 11.0 - 15.0 %   Platelets 326 140 - 400 Thousand/uL   MPV 10.9 7.5 - 12.5 fL   Neutro Abs 7,259 1,500 - 7,800 cells/uL   Absolute Lymphocytes 1,040 850 - 3,900 cells/uL   Absolute Monocytes 534 200 - 950 cells/uL   Eosinophils Absolute 285 15 - 500 cells/uL   Basophils Absolute 83 0 - 200 cells/uL   Neutrophils Relative % 78.9 %   Total Lymphocyte 11.3 %   Monocytes Relative 5.8 %   Eosinophils Relative 3.1 %   Basophils Relative 0.9 %  COMPLETE METABOLIC PANEL WITH GFR     Status: Abnormal  Collection Time: 04/30/23  2:57 PM  Result Value Ref Range   Glucose, Bld 88 65 - 99 mg/dL    Comment: .            Fasting reference interval .    BUN 24 7 - 25 mg/dL   Creat 1.61 (H) 0.96 - 0.95 mg/dL   eGFR 34 (L) > OR = 60 mL/min/1.68m2   BUN/Creatinine Ratio 16 6 - 22 (calc)   Sodium 139 135 - 146 mmol/L   Potassium 4.0 3.5 - 5.3 mmol/L   Chloride 105 98 - 110 mmol/L   CO2 25 20 - 32 mmol/L   Calcium 9.6 8.6 - 10.4 mg/dL   Total Protein 7.4 6.1 - 8.1 g/dL   Albumin 3.7 3.6 - 5.1 g/dL   Globulin 3.7 1.9 - 3.7 g/dL (calc)   AG Ratio 1.0 1.0 - 2.5 (calc)   Total Bilirubin 0.8 0.2 - 1.2 mg/dL   Alkaline phosphatase (APISO) 113 37 - 153 U/L   AST 16 10 - 35 U/L   ALT 9 6 - 29 U/L  Iron, TIBC and Ferritin Panel     Status: None   Collection Time: 04/30/23  2:57 PM  Result Value Ref Range   Iron 71 45 - 160 mcg/dL   TIBC 045 409 - 811 mcg/dL (calc)   %SAT 24 16 - 45 % (calc)   Ferritin 106 16 - 288 ng/mL     PHQ2/9:    04/30/2023    2:17 PM 04/03/2023   11:14 AM 01/23/2023    1:09 PM 01/09/2023   11:32 AM 11/10/2022   10:24 AM  Depression screen PHQ 2/9  Decreased Interest 0 1 0 3 0  Down, Depressed, Hopeless 0 1 0 3 0  PHQ - 2 Score 0 2 0 6 0  Altered sleeping 0 1 0 3   Tired, decreased energy 0 1 0 3   Change in appetite 0 1 0 0   Feeling bad or failure about yourself  0 1 0 0   Trouble concentrating 0 1 0 1   Moving  slowly or fidgety/restless 0 0 0 0   Suicidal thoughts 0 1 0 0   PHQ-9 Score 0 8 0 13   Difficult doing work/chores Not difficult at all Somewhat difficult Not difficult at all Very difficult       Fall Risk:    04/30/2023    2:08 PM 04/03/2023   11:13 AM 01/23/2023    1:09 PM 01/09/2023   11:32 AM 11/10/2022   10:24 AM  Fall Risk   Falls in the past year? 1 0 1 1 1   Number falls in past yr: 1 0 0 0 0  Injury with Fall? 1 0 1 1 1   Risk for fall due to : Impaired balance/gait No Fall Risks Impaired balance/gait Impaired balance/gait No Fall Risks  Follow up Falls prevention discussed;Education provided;Falls evaluation completed Falls prevention discussed;Education provided;Falls evaluation completed Education provided;Falls evaluation completed;Falls prevention discussed Falls prevention discussed;Education provided;Falls evaluation completed Falls prevention discussed      Functional Status Survey: Is the patient deaf or have difficulty hearing?: No Does the patient have difficulty seeing, even when wearing glasses/contacts?: No Does the patient have difficulty concentrating, remembering, or making decisions?: No Does the patient have difficulty walking or climbing stairs?: No Does the patient have difficulty dressing or bathing?: No Does the patient have difficulty doing errands alone such as visiting a doctor's office or shopping?: Yes  Assessment & Plan  Problem List Items Addressed This Visit       Cardiovascular and Mediastinum   HTN (hypertension)    BP appears in goal today BP meds were discontinued during hospitalization.  Recommend keeping BP logs for now and will reassess at next follow-up appointment Follow-up in 3 months or sooner if concerns arise      TIA (transient ischemic attack)    Patient was admitted to hospital and discharged on 04/23/2023 for TIA and stenosis of right internal carotid artery requiring stent placement I reviewed hospital discharge  summary as well as imaging results, procedure notes, lab work. We have discussed that she should continue with current medication regimen particularly aspirin/Brilinta. Recommend she keeps up coming appointments with vascular services for follow-up Will recheck CBC, CMP to make sure that everything is stable       Internal carotid artery stenosis, right - Primary    Patient was admitted to hospital and discharged on 04/23/2023 for TIA and stenosis of right internal carotid artery requiring stent placement I reviewed hospital discharge summary as well as imaging results, procedure notes, lab work. We have discussed that she should continue with current medication regimen particularly aspirin/Brilinta. Recommend she keeps up coming appointments with vascular services for follow-up Will recheck CBC, CMP to make sure that everything is stable        Genitourinary   Stage 3b chronic kidney disease (HCC)    Recheck labs      Relevant Orders   COMPLETE METABOLIC PANEL WITH GFR (Completed)   Other Visit Diagnoses     Hospital discharge follow-up       Other iron deficiency anemia       Relevant Orders   CBC w/Diff/Platelet (Completed)   Iron, TIBC and Ferritin Panel (Completed)         Return in about 3 months (around 07/31/2023) for Depression, anxiety, HTN, HLD.   I, Kiersten Coss E Zhamir Pirro, PA-C, have reviewed all documentation for this visit. The documentation on 05/02/23 for the exam, diagnosis, procedures, and orders are all accurate and complete.   Jacquelin Hawking, MHS, PA-C [facility]

## 2023-05-01 ENCOUNTER — Ambulatory Visit: Payer: Medicare Other | Admitting: Physician Assistant

## 2023-05-01 LAB — COMPLETE METABOLIC PANEL WITH GFR
AG Ratio: 1 (calc) (ref 1.0–2.5)
ALT: 9 U/L (ref 6–29)
AST: 16 U/L (ref 10–35)
Albumin: 3.7 g/dL (ref 3.6–5.1)
Alkaline phosphatase (APISO): 113 U/L (ref 37–153)
BUN/Creatinine Ratio: 16 (calc) (ref 6–22)
BUN: 24 mg/dL (ref 7–25)
CO2: 25 mmol/L (ref 20–32)
Calcium: 9.6 mg/dL (ref 8.6–10.4)
Chloride: 105 mmol/L (ref 98–110)
Creat: 1.52 mg/dL — ABNORMAL HIGH (ref 0.60–0.95)
Globulin: 3.7 g/dL (ref 1.9–3.7)
Glucose, Bld: 88 mg/dL (ref 65–99)
Potassium: 4 mmol/L (ref 3.5–5.3)
Sodium: 139 mmol/L (ref 135–146)
Total Bilirubin: 0.8 mg/dL (ref 0.2–1.2)
Total Protein: 7.4 g/dL (ref 6.1–8.1)
eGFR: 34 mL/min/{1.73_m2} — ABNORMAL LOW (ref >=60)

## 2023-05-01 LAB — CBC WITH DIFFERENTIAL/PLATELET
Absolute Lymphocytes: 1040 {cells}/uL (ref 850–3900)
Absolute Monocytes: 534 {cells}/uL (ref 200–950)
Basophils Absolute: 83 {cells}/uL (ref 0–200)
Basophils Relative: 0.9 %
Eosinophils Absolute: 285 {cells}/uL (ref 15–500)
Eosinophils Relative: 3.1 %
HCT: 32.8 % — ABNORMAL LOW (ref 35.0–45.0)
Hemoglobin: 10.9 g/dL — ABNORMAL LOW (ref 11.7–15.5)
MCH: 32.9 pg (ref 27.0–33.0)
MCHC: 33.2 g/dL (ref 32.0–36.0)
MCV: 99.1 fL (ref 80.0–100.0)
MPV: 10.9 fL (ref 7.5–12.5)
Monocytes Relative: 5.8 %
Neutro Abs: 7259 {cells}/uL (ref 1500–7800)
Neutrophils Relative %: 78.9 %
Platelets: 326 10*3/uL (ref 140–400)
RBC: 3.31 10*6/uL — ABNORMAL LOW (ref 3.80–5.10)
RDW: 13.7 % (ref 11.0–15.0)
Total Lymphocyte: 11.3 %
WBC: 9.2 10*3/uL (ref 3.8–10.8)

## 2023-05-01 LAB — IRON,TIBC AND FERRITIN PANEL
%SAT: 24 % (ref 16–45)
Ferritin: 106 ng/mL (ref 16–288)
Iron: 71 ug/dL (ref 45–160)
TIBC: 301 ug/dL (ref 250–450)

## 2023-05-02 NOTE — Assessment & Plan Note (Signed)
Patient was admitted to hospital and discharged on 04/23/2023 for TIA and stenosis of right internal carotid artery requiring stent placement I reviewed hospital discharge summary as well as imaging results, procedure notes, lab work. We have discussed that she should continue with current medication regimen particularly aspirin/Brilinta. Recommend she keeps up coming appointments with vascular services for follow-up Will recheck CBC, CMP to make sure that everything is stable

## 2023-05-02 NOTE — Progress Notes (Signed)
Your labs are back Your CBC shows that your anemia is improving Your iron levels are in normal range Your electrolytes and liver function testing appear to be in normal ranges.  Your kidney function remains decreased but this remains around your normal levels when compared to previous results.  Please member to stay well-hydrated and avoid kidney taxing medications such as NSAIDs

## 2023-05-02 NOTE — Assessment & Plan Note (Signed)
Recheck labs 

## 2023-05-02 NOTE — Assessment & Plan Note (Signed)
BP appears in goal today BP meds were discontinued during hospitalization.  Recommend keeping BP logs for now and will reassess at next follow-up appointment Follow-up in 3 months or sooner if concerns arise

## 2023-05-07 DIAGNOSIS — Z48812 Encounter for surgical aftercare following surgery on the circulatory system: Secondary | ICD-10-CM | POA: Diagnosis not present

## 2023-05-07 DIAGNOSIS — N179 Acute kidney failure, unspecified: Secondary | ICD-10-CM | POA: Diagnosis not present

## 2023-05-07 DIAGNOSIS — J449 Chronic obstructive pulmonary disease, unspecified: Secondary | ICD-10-CM | POA: Diagnosis not present

## 2023-05-07 DIAGNOSIS — N1832 Chronic kidney disease, stage 3b: Secondary | ICD-10-CM | POA: Diagnosis not present

## 2023-05-07 DIAGNOSIS — D631 Anemia in chronic kidney disease: Secondary | ICD-10-CM | POA: Diagnosis not present

## 2023-05-07 DIAGNOSIS — I129 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 stage 4 chronic kidney disease, or unspecified chronic kidney disease: Secondary | ICD-10-CM | POA: Diagnosis not present

## 2023-05-10 ENCOUNTER — Telehealth: Payer: Self-pay

## 2023-05-10 DIAGNOSIS — D631 Anemia in chronic kidney disease: Secondary | ICD-10-CM | POA: Diagnosis not present

## 2023-05-10 DIAGNOSIS — I129 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 stage 4 chronic kidney disease, or unspecified chronic kidney disease: Secondary | ICD-10-CM | POA: Diagnosis not present

## 2023-05-10 DIAGNOSIS — N179 Acute kidney failure, unspecified: Secondary | ICD-10-CM | POA: Diagnosis not present

## 2023-05-10 DIAGNOSIS — J449 Chronic obstructive pulmonary disease, unspecified: Secondary | ICD-10-CM | POA: Diagnosis not present

## 2023-05-10 DIAGNOSIS — Z48812 Encounter for surgical aftercare following surgery on the circulatory system: Secondary | ICD-10-CM | POA: Diagnosis not present

## 2023-05-10 DIAGNOSIS — N1832 Chronic kidney disease, stage 3b: Secondary | ICD-10-CM | POA: Diagnosis not present

## 2023-05-14 ENCOUNTER — Ambulatory Visit: Payer: Self-pay | Admitting: *Deleted

## 2023-05-14 DIAGNOSIS — J449 Chronic obstructive pulmonary disease, unspecified: Secondary | ICD-10-CM | POA: Diagnosis not present

## 2023-05-14 DIAGNOSIS — Z48812 Encounter for surgical aftercare following surgery on the circulatory system: Secondary | ICD-10-CM | POA: Diagnosis not present

## 2023-05-14 DIAGNOSIS — N1832 Chronic kidney disease, stage 3b: Secondary | ICD-10-CM | POA: Diagnosis not present

## 2023-05-14 DIAGNOSIS — I129 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 stage 4 chronic kidney disease, or unspecified chronic kidney disease: Secondary | ICD-10-CM | POA: Diagnosis not present

## 2023-05-14 DIAGNOSIS — D631 Anemia in chronic kidney disease: Secondary | ICD-10-CM | POA: Diagnosis not present

## 2023-05-14 DIAGNOSIS — N179 Acute kidney failure, unspecified: Secondary | ICD-10-CM | POA: Diagnosis not present

## 2023-05-14 NOTE — Telephone Encounter (Signed)
Reason for Disposition  Small bruise is present  Answer Assessment - Initial Assessment Questions 1. MECHANISM: "How did the fall happen?"     Lemuel, PT with [facility] with pt calling in to report a fall.        She fell yesterday in her bedroom.   Her cat jumped at her in an aggressive way.  She jumped and her  lost balance and hit her head on the wall and her left arm on the dresser and fell on her bottom on the floor.   She has several bruises on her left arm and back.   She has a  skin tear on left upper arm.    It was bleeding yesterday.   but not today.      She has a small knot on her head on the right side.    Every time a pt falls we have to call it in.  Pt in background spoke up and was able to answer my triage questions.   She is on blood thinners.   "I have a small knot on the right side of my head".   "It did not bleed and has not gotten any larger".   "It's just tender".   Denies headaches, dizziness, or visual changes.  "I don't feel like I need to come in".   "I'm just scraped up a little and bruised".   "I'll come in if you feel I need to but I don't feel I do".    I went over the s/s to watch for with her head.  Go to the ED if she develops dizziness, feeling off balance, headaches, visual changes, or the knot becomes larger.   Pt. Was agreeable to this plan.       . DOMESTIC VIOLENCE AND ELDER ABUSE SCREENING: "Did you fall because someone pushed you or tried to hurt you?" If Yes, ask: "Are you safe now?"     N/A 3. ONSET: "When did the fall happen?" (e.g., minutes, hours, or days ago)     Yesterday (Sunday) 05/13/2023. 4. LOCATION: "What part of the body hit the ground?" (e.g., back, buttocks, head, hips, knees, hands, head, stomach)     See above 5. INJURY: "Did you hurt (injure) yourself when you fell?" If Yes, ask: "What did you injure? Tell me more about this?" (e.g., body area; type of injury; pain severity)"     See above 6. PAIN: "Is there any pain?" If Yes, ask:  "How bad is the pain?" (e.g., Scale 1-10; or mild,  moderate, severe)   - NONE (0): No pain   - MILD (1-3): Doesn't interfere with normal activities    - MODERATE (4-7): Interferes with normal activities or awakens from sleep    - SEVERE (8-10): Excruciating pain, unable to do any normal activities      The knot on my head is a little tender when I touch it.  7. SIZE: For cuts, bruises, or swelling, ask: "How large is it?" (e.g., inches or centimeters)      Scrap on left upper arm that is bandaged up. 8. PREGNANCY: "Is there any chance you are pregnant?" "When was your last menstrual period?"     N/A due to age 82. OTHER SYMPTOMS: "Do you have any other symptoms?" (e.g., dizziness, fever, weakness; new onset or worsening).      No 10. CAUSE: "What do you think caused the fall (or falling)?" (e.g., tripped, dizzy spell)  My cat jumped at me in an aggressive way and I jumped and lost my balance.  Protocols used: Falls and [facility]

## 2023-05-14 NOTE — Telephone Encounter (Signed)
  Chief Complaint: Christine Burnett, PT with [facility] called in to report a fall. Symptoms: Small knot on right side of head, skin tear on left upper arm that is bandaged,  Several bruises on left arm and her back.   Her cat jumped at her in an aggressive way and she jumped and lost her balance.    Frequency: Yesterday (12/8) Pertinent Negatives: Patient denies serious injuries.   "I'm just scrapped and bruised up and have a small knot on the right side of my head".    She is on blood thinners so precautions given to go to the ED if they occur. Disposition: [] ED /[] Urgent Care (no appt availability in office) / [] Appointment(In office/virtual)/ []  [facility]/ [x] Home Care/ [] Refused Recommended Disposition /[] [facility]/ [x]  Follow-up with PCP Additional Notes: Message sent to [employer], PA-C who is covering for Danelle Berry PA-C who is out on leave.

## 2023-05-17 DIAGNOSIS — I129 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 stage 4 chronic kidney disease, or unspecified chronic kidney disease: Secondary | ICD-10-CM | POA: Diagnosis not present

## 2023-05-17 DIAGNOSIS — N1832 Chronic kidney disease, stage 3b: Secondary | ICD-10-CM | POA: Diagnosis not present

## 2023-05-17 DIAGNOSIS — J449 Chronic obstructive pulmonary disease, unspecified: Secondary | ICD-10-CM | POA: Diagnosis not present

## 2023-05-17 DIAGNOSIS — D631 Anemia in chronic kidney disease: Secondary | ICD-10-CM | POA: Diagnosis not present

## 2023-05-17 DIAGNOSIS — N179 Acute kidney failure, unspecified: Secondary | ICD-10-CM | POA: Diagnosis not present

## 2023-05-17 DIAGNOSIS — Z48812 Encounter for surgical aftercare following surgery on the circulatory system: Secondary | ICD-10-CM | POA: Diagnosis not present

## 2023-05-18 ENCOUNTER — Ambulatory Visit (INDEPENDENT_AMBULATORY_CARE_PROVIDER_SITE_OTHER): Payer: Medicare Other

## 2023-05-18 ENCOUNTER — Ambulatory Visit (INDEPENDENT_AMBULATORY_CARE_PROVIDER_SITE_OTHER): Payer: Medicare Other | Admitting: Vascular Surgery

## 2023-05-18 ENCOUNTER — Encounter (INDEPENDENT_AMBULATORY_CARE_PROVIDER_SITE_OTHER): Payer: Self-pay | Admitting: Vascular Surgery

## 2023-05-18 ENCOUNTER — Other Ambulatory Visit (INDEPENDENT_AMBULATORY_CARE_PROVIDER_SITE_OTHER): Payer: Self-pay | Admitting: Vascular Surgery

## 2023-05-18 VITALS — BP 176/96 | HR 97 | Resp 18 | Ht 66.0 in | Wt 132.6 lb

## 2023-05-18 DIAGNOSIS — I6522 Occlusion and stenosis of left carotid artery: Secondary | ICD-10-CM

## 2023-05-18 DIAGNOSIS — I10 Essential (primary) hypertension: Secondary | ICD-10-CM

## 2023-05-18 DIAGNOSIS — I771 Stricture of artery: Secondary | ICD-10-CM | POA: Insufficient documentation

## 2023-05-18 DIAGNOSIS — I739 Peripheral vascular disease, unspecified: Secondary | ICD-10-CM

## 2023-05-18 DIAGNOSIS — Z9889 Other specified postprocedural states: Secondary | ICD-10-CM | POA: Diagnosis not present

## 2023-05-18 DIAGNOSIS — E782 Mixed hyperlipidemia: Secondary | ICD-10-CM

## 2023-05-18 NOTE — Progress Notes (Signed)
MRN : [id number]  Christine Burnett is a 82 y.o. (March 22, 1941) female who presents with chief complaint of  Chief Complaint  Patient presents with   Follow-up    2 week follow up  .  History of Present Illness: Patient returns today in follow up of multiple vascular issues.  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  She is doing great.  She has had no periprocedural complications after the procedure and no further neurologic symptoms.  Her access site is well-healed.  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  She has had no rest pain, lifestyle limiting claudication, or ulceration. Her carotid duplex today shows normal velocities in the common carotid artery consistent with good flow in the innominate artery and 1 to 39% stenosis in the right ICA.  Her brachial artery blood pressures are also equal.  ABIs are 1.04 on the right and 0.95 on the left.    Current Outpatient Medications  Medication Sig Dispense Refill   albuterol (VENTOLIN HFA) 108 (90 Base) MCG/ACT inhaler Inhale 2 puffs into the lungs every 4 (four) hours as needed for wheezing or shortness of breath. 8 g 1   aspirin 81 MG EC tablet Take 81 mg by mouth at bedtime. Swallow whole.     atorvastatin (LIPITOR) 40 MG tablet Take 1 tablet (40 mg total) by mouth at bedtime. 90 tablet 1   Calcium 200 MG TABS Take 300 mg by mouth every Monday, Tuesday, Wednesday, Thursday, and Friday.     calcium carbonate (TUMS - DOSED IN MG ELEMENTAL CALCIUM) 500 MG chewable tablet Chew 1.5 tablets (300 mg of elemental calcium total) by mouth every Monday, Tuesday, Wednesday, Thursday, and Friday.     Cholecalciferol (VITAMIN D3) 50 MCG (2000 UT) TABS Take 2,000 Units by mouth at bedtime.     ferrous sulfate 325 (65 FE) MG EC tablet Take 1 tablet (325 mg total) by mouth every other day. 60 tablet 0   fluticasone  furoate-vilanterol (BREO ELLIPTA) 100-25 MCG/ACT AEPB Inhale 1 puff into the lungs daily. 1 each 11   melatonin 5 MG TABS Take 0.5 tablets (2.5 mg total) by mouth at bedtime.     Multiple Vitamin (MULTIVITAMIN) capsule Take 1 capsule by mouth daily.     PARoxetine (PAXIL) 40 MG tablet Take 1 tablet (40 mg total) by mouth every morning. 90 tablet 2   senna-docusate (SENOKOT-S) 8.6-50 MG tablet Take 1 tablet by mouth at bedtime as needed for moderate constipation. 30 tablet 1   ticagrelor (BRILINTA) 90 MG TABS tablet Take 1 tablet (90 mg total) by mouth 2 (two) times daily. 60 tablet 1   albuterol (PROVENTIL) (2.5 MG/3ML) 0.083% nebulizer solution Take 3 mLs (2.5 mg total) by nebulization every 4 (four) hours as needed for shortness of breath or wheezing. (Patient not taking: Reported on 05/18/2023)     busPIRone (BUSPAR) 5 MG tablet Take 1 tablet (5 mg total) by mouth 2 (two) times daily as needed (stress anxiety). (Patient not taking: Reported on 04/24/2023) 60 tablet 1   melatonin 3 MG TABS tablet Take 3 mg by mouth at bedtime. (Patient not taking: Reported on 04/24/2023)     No current facility-administered medications for this visit.    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   Managed by nephrologist   Non-small cell lung cancer (NSCLC) (HCC)    Osteoporosis    Oxygen deficiency Me   Risk for falls    Situational disturbance    Stroke (HCC) 2011   tia's x 2   TIA (transient ischemic attack)     Past Surgical  History:  Procedure Laterality Date   CAROTID PTA/STENT INTERVENTION Right 04/20/2023   Procedure: CAROTID PTA/STENT INTERVENTION;  Surgeon: Annice Needy, MD;  Location: [facility];  Service: Cardiovascular;  Laterality: Right;  Inominate Artery Stenosis requiring stenting   COLONOSCOPY  2016   ESOPHAGUS SURGERY     INTRAMEDULLARY (IM) NAIL INTERTROCHANTERIC Left 03/26/2017   Procedure: INTRAMEDULLARY (IM) NAIL INTERTROCHANTRIC;  Surgeon: Kennedy Bucker, MD;  Location: [facility];  Service: Orthopedics;  Laterality: Left;   LEFT HEART CATH AND CORONARY ANGIOGRAPHY N/A 04/15/2021   Procedure: LEFT HEART CATH AND CORONARY ANGIOGRAPHY;  Surgeon: Iran Ouch, MD;  Location: [facility];  Service: Cardiovascular;  Laterality: N/A;   RIGHT/LEFT HEART CATH AND CORONARY ANGIOGRAPHY Bilateral 05/15/2022   Procedure: RIGHT/LEFT HEART CATH AND CORONARY ANGIOGRAPHY;  Surgeon: Iran Ouch, MD;  Location: [facility];  Service: Cardiovascular;  Laterality: Bilateral;   VIDEO BRONCHOSCOPY WITH ENDOBRONCHIAL NAVIGATION Right 06/28/2020   Procedure: VIDEO BRONCHOSCOPY WITH ENDOBRONCHIAL NAVIGATION CELLVIZIO;  Surgeon: Salena Saner, MD;  Location: [facility];  Service: Pulmonary;  Laterality: Right;     Social History   Tobacco Use   Smoking status: Former    Current packs/day: 0.00    Average packs/day: 1 pack/day for 61.0 years (61.0 ttl pk-yrs)    Types: Cigarettes    Start date: 03/25/1956    Quit date: 03/25/2017    Years since quitting: 6.1   Smokeless tobacco: Never  Vaping Use   Vaping status: Never Used  Substance Use Topics   Alcohol use: No   Drug use: No       Family History  Problem Relation Age of Onset   Other Mother        [place] posioning   Kidney disease Mother    Heart attack Father    Prostate cancer Father    Cancer Father    Heart disease Father    Heart attack Brother    Diabetes Brother    Hypertension Daughter    Kidney  cancer Neg Hx    Bladder Cancer Neg Hx    Colon cancer Neg Hx    Colon polyps Neg Hx    Rectal cancer Neg Hx      Allergies  Allergen Reactions   Zithromax [Azithromycin]    Cefuroxime Axetil Rash   Ciprofloxacin Nausea Only    Unknown   Codeine Other (See Comments)    Patient does not like how the medication makes you feel      REVIEW OF SYSTEMS (Negative unless checked)  Constitutional: [] Weight loss  [] Fever  [] Chills Cardiac: [] Chest pain   [] Chest pressure   [] Palpitations   [] Shortness  of breath when laying flat   [] Shortness of breath at rest   [] Shortness of breath with exertion. Vascular:  [x] Pain in legs with walking   [] Pain in legs at rest   [] Pain in legs when laying flat   [] Claudication   [] Pain in feet when walking  [] Pain in feet at rest  [] Pain in feet when laying flat   [] History of DVT   [] Phlebitis   [] Swelling in legs   [] Varicose veins   [] Non-healing ulcers Pulmonary:   [] Uses home oxygen   [] Productive cough   [] Hemoptysis   [] Wheeze  [] COPD   [] Asthma Neurologic:  [] Dizziness  [] Blackouts   [] Seizures   [] History of stroke   [x] History of TIA  [] Aphasia   [] Temporary blindness   [] Dysphagia   [] Weakness or numbness in arms   [] Weakness or numbness in legs Musculoskeletal:  [] Arthritis   [] Joint swelling   [] Joint pain   [] Low back pain Hematologic:  [] Easy bruising  [] Easy bleeding   [] Hypercoagulable state   [] Anemic   Gastrointestinal:  [] Blood in stool   [] Vomiting blood  [] Gastroesophageal reflux/heartburn   [] Abdominal pain Genitourinary:  [x] Chronic kidney disease   [] Difficult urination  [] Frequent urination  [] Burning with urination   [] Hematuria Skin:  [] Rashes   [] Ulcers   [] Wounds Psychological:  [] History of anxiety   []  History of major depression.  Physical Examination  BP (!) 176/96   Pulse 97   Resp 18   Ht 5\' 6"  (1.676 m)   Wt 132 lb 9.6 oz (60.1 kg)   BMI 21.40 kg/m  Gen:  WD/WN, NAD. Appears younger than stated age. Head: [place]/AT,  No temporalis wasting. Ear/Nose/Throat: Hearing grossly intact, nares w/o erythema or drainage Eyes: Conjunctiva clear. Sclera non-icteric Neck: Supple.  Trachea midline Pulmonary:  Good air movement, no use of accessory muscles.  Cardiac: RRR, no JVD Vascular:  Vessel Right Left  Radial Palpable Palpable                          PT Palpable Palpable  DP Palpable Palpable   Gastrointestinal: soft, non-tender/non-distended. No guarding/reflex.  Musculoskeletal: M/S 5/5 throughout.  No deformity or atrophy. No edema. Neurologic: Sensation grossly intact in extremities.  Symmetrical.  Speech is fluent.  Psychiatric: Judgment intact, Mood & affect appropriate for pt's clinical situation. Dermatologic: No rashes or ulcers noted.  No cellulitis or open wounds.      Labs Recent Results (from the past 2160 hours)  Basic metabolic panel     Status: Abnormal   Collection Time: 04/17/23  6:43 PM  Result Value Ref Range   Sodium 136 135 - 145 mmol/L   Potassium 4.0 3.5 - 5.1 mmol/L   Chloride 103 98 - 111 mmol/L   CO2 24 22 - 32 mmol/L   Glucose, Bld 101 (H) 70 - 99 mg/dL    Comment: Glucose reference range applies only to samples taken after fasting for at least 8 hours.   BUN 27 (H) 8 - 23 mg/dL   Creatinine, Ser 4.74 (H) 0.44 - 1.00 mg/dL   Calcium 9.2 8.9 - 25.9 mg/dL   GFR, Estimated 34 (L) >60 mL/min    Comment: (NOTE) Calculated using the CKD-EPI Creatinine Equation (2021)    Anion gap 9 5 - 15    Comment: Performed at [facility], [address] [place], [place] [address]  CBC     Status: Abnormal   Collection Time: 04/17/23  6:43 PM  Result Value Ref Range   WBC 5.5 4.0 - 10.5 K/uL   RBC 3.59 (L) 3.87 - 5.11 MIL/uL   Hemoglobin 12.1 12.0 - 15.0 g/dL   HCT 46.9 62.9 - 52.8 %   MCV 101.7 (H) 80.0 - 100.0 fL   MCH 33.7 26.0 - 34.0 pg   MCHC 33.2 30.0 - 36.0 g/dL   RDW 41.3 24.4 - 01.0 %   Platelets 199 150 - 400 K/uL   nRBC 0.0 0.0 - 0.2 %     Comment: Performed at [facility], [address] [place], [place] [address]  Troponin I (High Sensitivity)     Status: Abnormal   Collection Time: 04/17/23  6:43 PM  Result Value Ref Range   Troponin I (High Sensitivity) 22 (H) <18 ng/L    Comment: (NOTE) Elevated high sensitivity troponin I (hsTnI) values and significant  changes across serial measurements may suggest ACS but many other  chronic and acute conditions are known to elevate hsTnI results.  Refer to the "Links" section for chest pain algorithms and additional  guidance. Performed at [facility], [address] [place], [place] [address]   Hemoglobin A1c     Status: None   Collection Time: 04/17/23  6:43 PM  Result Value Ref Range   Hgb A1c MFr Bld 5.4 4.8 - 5.6 %    Comment: (NOTE) Pre diabetes:          5.7%-6.4%  Diabetes:              >6.4%  Glycemic control for   <7.0% adults with diabetes    Mean Plasma Glucose 108.28 mg/dL    Comment: Performed at [facility], [address] [place], [place] [address]  Troponin I (High Sensitivity)     Status: Abnormal   Collection Time: 04/17/23  9:14 PM  Result Value Ref Range   Troponin I (High Sensitivity) 28 (H) <18 ng/L    Comment: (NOTE) Elevated high sensitivity troponin I (hsTnI) values and significant  changes across serial measurements may suggest ACS but many other  chronic and acute conditions are known to elevate hsTnI results.  Refer to the "Links" section for chest pain algorithms and additional  guidance. Performed at [facility], [address] [place], [place] [address]   Urinalysis, Routine w reflex microscopic -Urine, Clean Catch     Status: Abnormal   Collection Time: 04/17/23  9:33 PM  Result Value Ref Range   Color, Urine YELLOW (A) YELLOW   APPearance CLEAR (A) CLEAR   Specific Gravity, Urine 1.013 1.005 - 1.030   pH 6.0 5.0 - 8.0   Glucose, UA NEGATIVE NEGATIVE mg/dL   Hgb urine dipstick NEGATIVE  NEGATIVE   Bilirubin Urine NEGATIVE NEGATIVE   Ketones, ur NEGATIVE NEGATIVE mg/dL   Protein, ur NEGATIVE NEGATIVE mg/dL   Nitrite NEGATIVE NEGATIVE   Leukocytes,Ua NEGATIVE NEGATIVE    Comment: Performed at [facility], [address] [place], [place] [address]  Lipid panel     Status: None   Collection Time: 04/18/23  4:54 AM  Result Value Ref Range   Cholesterol 103 0 - 200 mg/dL   Triglycerides 95 <332 mg/dL   HDL 42 >95 mg/dL   Total CHOL/HDL Ratio 2.5 RATIO   VLDL 19 0 - 40 mg/dL   LDL Cholesterol 42 0 - 99 mg/dL    Comment:        Total Cholesterol/HDL:CHD Risk Coronary  Heart Disease Risk Table                     Men   Women  1/2 Average Risk   3.4   3.3  Average Risk       5.0   4.4  2 X Average Risk   9.6   7.1  3 X Average Risk  23.4   11.0        Use the calculated Patient Ratio above and the CHD Risk Table to determine the patient's CHD Risk.        ATP III CLASSIFICATION (LDL):  <100     mg/dL   Optimal  469-629  mg/dL   Near or Above                    Optimal  130-159  mg/dL   Borderline  528-413  mg/dL   High  >244     mg/dL   Very High Performed at [facility], [address] [place], [place] [address]   ECHOCARDIOGRAM COMPLETE BUBBLE STUDY     Status: None   Collection Time: 04/18/23  8:15 AM  Result Value Ref Range   Ao pk vel 1.33 m/s   AV Area VTI 2.26 cm2   AR max vel 1.84 cm2   AV Mean grad 4.3 mmHg   AV Peak grad 7.0 mmHg   S' Lateral 2.40 cm   AV Area mean vel 1.76 cm2   Area-P 1/2 8.62 cm2   MV VTI 2.27 cm2   Est EF 55 - 60%   Basic metabolic panel     Status: Abnormal   Collection Time: 04/19/23  8:38 AM  Result Value Ref Range   Sodium 136 135 - 145 mmol/L   Potassium 4.3 3.5 - 5.1 mmol/L   Chloride 102 98 - 111 mmol/L   CO2 26 22 - 32 mmol/L   Glucose, Bld 106 (H) 70 - 99 mg/dL    Comment: Glucose reference range applies only to samples taken after fasting for at least 8 hours.   BUN 28 (H) 8 - 23 mg/dL    Creatinine, Ser 2.53 (H) 0.44 - 1.00 mg/dL   Calcium 9.4 8.9 - 66.4 mg/dL   GFR, Estimated 28 (L) >60 mL/min    Comment: (NOTE) Calculated using the CKD-EPI Creatinine Equation (2021)    Anion gap 8 5 - 15    Comment: Performed at [facility], [address] [place], [place] [address]  CBC     Status: Abnormal   Collection Time: 04/20/23  4:38 AM  Result Value Ref Range   WBC 5.9 4.0 - 10.5 K/uL   RBC 3.65 (L) 3.87 - 5.11 MIL/uL   Hemoglobin 12.0 12.0 - 15.0 g/dL   HCT 42.5 (L) 95.6 - 38.7 %   MCV 97.8 80.0 - 100.0 fL   MCH 32.9 26.0 - 34.0 pg   MCHC 33.6 30.0 - 36.0 g/dL   RDW 56.4 33.2 - 95.1 %   Platelets 200 150 - 400 K/uL   nRBC 0.0 0.0 - 0.2 %    Comment: Performed at [facility], [address], [place], [place] [address]  Basic metabolic panel     Status: Abnormal   Collection Time: 04/20/23  4:38 AM  Result Value Ref Range   Sodium 142 135 - 145 mmol/L   Potassium 4.5 3.5 - 5.1 mmol/L   Chloride 106 98 - 111 mmol/L  CO2 26 22 - 32 mmol/L   Glucose, Bld 108 (H) 70 - 99 mg/dL    Comment: Glucose reference range applies only to samples taken after fasting for at least 8 hours.   BUN 27 (H) 8 - 23 mg/dL   Creatinine, Ser 7.82 (H) 0.44 - 1.00 mg/dL   Calcium 9.5 8.9 - 95.6 mg/dL   GFR, Estimated 28 (L) >60 mL/min    Comment: (NOTE) Calculated using the CKD-EPI Creatinine Equation (2021)    Anion gap 10 5 - 15    Comment: Performed at [facility], [address] [place], [place] [address]  POCT Activated clotting time     Status: None   Collection Time: 04/20/23 10:15 AM  Result Value Ref Range   Activated Clotting Time 245 seconds    Comment: Reference range 74-137 seconds for patients not on anticoagulant therapy.  Glucose, capillary     Status: Abnormal   Collection Time: 04/20/23  5:22 PM  Result Value Ref Range   Glucose-Capillary 110 (H) 70 - 99 mg/dL    Comment: Glucose reference range applies only to samples taken  after fasting for at least 8 hours.  MRSA Next Gen by PCR, Nasal     Status: None   Collection Time: 04/20/23  5:24 PM   Specimen: Nasal Mucosa; Nasal Swab  Result Value Ref Range   MRSA by PCR Next Gen NOT DETECTED NOT DETECTED    Comment: (NOTE) The GeneXpert MRSA Assay (FDA approved for NASAL specimens only), is one component of a comprehensive MRSA colonization surveillance program. It is not intended to diagnose MRSA infection nor to guide or monitor treatment for MRSA infections. Test performance is not FDA approved in patients less than 51 years old. Performed at [facility], [address] [place], [place] [address]   CBC     Status: Abnormal   Collection Time: 04/21/23  3:42 AM  Result Value Ref Range   WBC 9.3 4.0 - 10.5 K/uL   RBC 2.81 (L) 3.87 - 5.11 MIL/uL   Hemoglobin 9.3 (L) 12.0 - 15.0 g/dL   HCT 69.6 (L) 29.5 - 28.4 %   MCV 103.9 (H) 80.0 - 100.0 fL   MCH 33.1 26.0 - 34.0 pg   MCHC 31.8 30.0 - 36.0 g/dL   RDW 13.2 44.0 - 10.2 %   Platelets 172 150 - 400 K/uL   nRBC 0.0 0.0 - 0.2 %    Comment: Performed at [facility], [address] [place], [place] [address]  Basic metabolic panel     Status: Abnormal   Collection Time: 04/21/23  3:42 AM  Result Value Ref Range   Sodium 136 135 - 145 mmol/L   Potassium 4.5 3.5 - 5.1 mmol/L   Chloride 106 98 - 111 mmol/L   CO2 23 22 - 32 mmol/L   Glucose, Bld 133 (H) 70 - 99 mg/dL    Comment: Glucose reference range applies only to samples taken after fasting for at least 8 hours.   BUN 25 (H) 8 - 23 mg/dL   Creatinine, Ser 6.44 (H) 0.44 - 1.00 mg/dL   Calcium 8.7 (L) 8.9 - 10.3 mg/dL   GFR, Estimated 35 (L) >60 mL/min    Comment: (NOTE) Calculated using the CKD-EPI Creatinine Equation (2021)    Anion gap 7 5 - 15    Comment: Performed at [facility], [address], [place], [place] [address]  CBC     Status: Abnormal  Collection Time: 04/22/23  5:13 AM  Result Value Ref Range    WBC 7.6 4.0 - 10.5 K/uL   RBC 2.26 (L) 3.87 - 5.11 MIL/uL   Hemoglobin 7.6 (L) 12.0 - 15.0 g/dL   HCT 40.3 (L) 47.4 - 25.9 %   MCV 103.1 (H) 80.0 - 100.0 fL   MCH 33.6 26.0 - 34.0 pg   MCHC 32.6 30.0 - 36.0 g/dL   RDW 56.3 87.5 - 64.3 %   Platelets 132 (L) 150 - 400 K/uL   nRBC 0.0 0.0 - 0.2 %    Comment: Performed at [facility], [address] [place], [place] [address]  Basic metabolic panel     Status: Abnormal   Collection Time: 04/22/23  5:13 AM  Result Value Ref Range   Sodium 135 135 - 145 mmol/L   Potassium 3.9 3.5 - 5.1 mmol/L   Chloride 105 98 - 111 mmol/L   CO2 23 22 - 32 mmol/L   Glucose, Bld 109 (H) 70 - 99 mg/dL    Comment: Glucose reference range applies only to samples taken after fasting for at least 8 hours.   BUN 23 8 - 23 mg/dL   Creatinine, Ser 8.84 (H) 0.44 - 1.00 mg/dL   Calcium 8.7 (L) 8.9 - 10.3 mg/dL   GFR, Estimated 31 (L) >60 mL/min    Comment: (NOTE) Calculated using the CKD-EPI Creatinine Equation (2021)    Anion gap 7 5 - 15    Comment: Performed at [facility], [address] [place], [place] [address]  Prepare RBC (crossmatch)     Status: None   Collection Time: 04/22/23 10:10 AM  Result Value Ref Range   Order Confirmation      ORDER PROCESSED BY BLOOD BANK Performed at [facility], [address] [place], [place] [address]   Type and screen [facility]     Status: None   Collection Time: 04/22/23 11:14 AM  Result Value Ref Range   ABO/RH(D) O POS    Antibody Screen NEG    Sample Expiration 04/25/2023,2359    Unit Number [id number]    Blood Component Type RED CELLS,LR    Unit division 00    Status of Unit ISSUED,FINAL    Transfusion Status OK TO TRANSFUSE    Crossmatch Result      Compatible Performed at [facility], [address] [place], [place] [address]   BPAM RBC     Status: None   Collection Time: 04/22/23 11:14 AM  Result Value Ref Range   ISSUE DATE  / TIME [id number]    Blood Product Unit Number [id number]    PRODUCT CODE [id number]    Unit Type and Rh 5100    Blood Product Expiration Date [id number]   Occult blood card to lab, stool     Status: None   Collection Time: 04/22/23 11:40 AM  Result Value Ref Range   Fecal Occult Bld NEGATIVE NEGATIVE    Comment: Performed at [facility], [address] [place], [place] [address]  Hemoglobin and hematocrit, blood     Status: Abnormal   Collection Time: 04/22/23  6:32 PM  Result Value Ref Range   Hemoglobin 9.1 (L) 12.0 - 15.0 g/dL   HCT 96.7 (L) 89.3 - 81.0 %    Comment: Performed at [facility], [address], [place], [place] [address]  CBC     Status: Abnormal   Collection Time: 04/23/23  4:52 AM  Result Value Ref Range   WBC 8.0 4.0 - 10.5 K/uL   RBC 2.67 (L) 3.87 - 5.11 MIL/uL   Hemoglobin 8.8 (L) 12.0 - 15.0 g/dL   HCT 09.8 (L) 11.9 - 14.7 %   MCV 97.8 80.0 - 100.0 fL    Comment: REPEATED TO VERIFY   MCH 33.0 26.0 - 34.0 pg   MCHC 33.7 30.0 - 36.0 g/dL   RDW 82.9 (H) 56.2 - 13.0 %   Platelets 147 (L) 150 - 400 K/uL   nRBC 0.0 0.0 - 0.2 %    Comment: Performed at [facility], [address], [place], [place] [address]  Basic metabolic panel     Status: Abnormal   Collection Time: 04/23/23  4:52 AM  Result Value Ref Range   Sodium 138 135 - 145 mmol/L   Potassium 3.6 3.5 - 5.1 mmol/L   Chloride 106 98 - 111 mmol/L   CO2 24 22 - 32 mmol/L   Glucose, Bld 96 70 - 99 mg/dL    Comment: Glucose reference range applies only to samples taken after fasting for at least 8 hours.   BUN 21 8 - 23 mg/dL   Creatinine, Ser 4.69 (H) 0.44 - 1.00 mg/dL   Calcium 8.7 (L) 8.9 - 10.3 mg/dL   GFR, Estimated 35 (L) >60 mL/min    Comment: (NOTE) Calculated using the CKD-EPI Creatinine Equation (2021)    Anion gap 8 5 - 15    Comment: Performed at [facility], [address] [place], [place] [address]  CBC with Differential     Status:  Abnormal   Collection Time: 04/26/23 11:30 AM  Result Value Ref Range   WBC 7.3 4.0 - 10.5 K/uL   RBC 3.09 (L) 3.87 - 5.11 MIL/uL   Hemoglobin 10.0 (L) 12.0 - 15.0 g/dL   HCT 84.1 (L) 32.4 - 40.1 %   MCV 98.4 80.0 - 100.0 fL   MCH 32.4 26.0 - 34.0 pg   MCHC 32.9 30.0 - 36.0 g/dL   RDW 02.7 25.3 - 66.4 %   Platelets 248 150 - 400 K/uL   nRBC 0.0 0.0 - 0.2 %   Neutrophils Relative % 76 %   Neutro Abs 5.6 1.7 - 7.7 K/uL   Lymphocytes Relative 14 %   Lymphs Abs 1.0 0.7 - 4.0 K/uL   Monocytes Relative 6 %   Monocytes Absolute 0.4 0.1 - 1.0 K/uL   Eosinophils Relative 3 %   Eosinophils Absolute 0.2 0.0 - 0.5 K/uL   Basophils Relative 1 %   Basophils Absolute 0.0 0.0 - 0.1 K/uL   Immature Granulocytes 0 %   Abs Immature Granulocytes 0.03 0.00 - 0.07 K/uL    Comment: Performed at [facility], [address] [place], [place] [address]  Basic metabolic panel     Status: Abnormal   Collection Time: 04/26/23 11:30 AM  Result Value Ref Range   Sodium 140 135 - 145 mmol/L   Potassium 3.3 (L) 3.5 - 5.1 mmol/L   Chloride 108 98 - 111 mmol/L   CO2 23 22 - 32 mmol/L   Glucose, Bld 124 (H) 70 - 99 mg/dL    Comment: Glucose reference range applies only to samples taken after fasting for at least 8 hours.   BUN 24 (H) 8 - 23 mg/dL   Creatinine, Ser 4.25 (H) 0.44 - 1.00 mg/dL   Calcium 9.3 8.9 - 95.6 mg/dL   GFR, Estimated 33 (  L) >60 mL/min    Comment: (NOTE) Calculated using the CKD-EPI Creatinine Equation (2021)    Anion gap 9 5 - 15    Comment: Performed at [facility], [address] [place], [place] [address]  CBC w/Diff/Platelet     Status: Abnormal   Collection Time: 04/30/23  2:57 PM  Result Value Ref Range   WBC 9.2 3.8 - 10.8 Thousand/uL   RBC 3.31 (L) 3.80 - 5.10 Million/uL   Hemoglobin 10.9 (L) 11.7 - 15.5 g/dL   HCT 84.6 (L) 96.2 - 95.2 %   MCV 99.1 80.0 - 100.0 fL   MCH 32.9 27.0 - 33.0 pg   MCHC 33.2 32.0 - 36.0 g/dL    Comment: For adults, a  slight decrease in the calculated MCHC value (in the range of 30 to 32 g/dL) is most likely not clinically significant; however, it should be interpreted with caution in correlation with other red cell parameters and the patient's clinical condition.    RDW 13.7 11.0 - 15.0 %   Platelets 326 140 - 400 Thousand/uL   MPV 10.9 7.5 - 12.5 fL   Neutro Abs 7,259 1,500 - 7,800 cells/uL   Absolute Lymphocytes 1,040 850 - 3,900 cells/uL   Absolute Monocytes 534 200 - 950 cells/uL   Eosinophils Absolute 285 15 - 500 cells/uL   Basophils Absolute 83 0 - 200 cells/uL   Neutrophils Relative % 78.9 %   Total Lymphocyte 11.3 %   Monocytes Relative 5.8 %   Eosinophils Relative 3.1 %   Basophils Relative 0.9 %  COMPLETE METABOLIC PANEL WITH GFR     Status: Abnormal   Collection Time: 04/30/23  2:57 PM  Result Value Ref Range   Glucose, Bld 88 65 - 99 mg/dL    Comment: .            Fasting reference interval .    BUN 24 7 - 25 mg/dL   Creat 8.41 (H) 3.24 - 0.95 mg/dL   eGFR 34 (L) > OR = 60 mL/min/1.34m2   BUN/Creatinine Ratio 16 6 - 22 (calc)   Sodium 139 135 - 146 mmol/L   Potassium 4.0 3.5 - 5.3 mmol/L   Chloride 105 98 - 110 mmol/L   CO2 25 20 - 32 mmol/L   Calcium 9.6 8.6 - 10.4 mg/dL   Total Protein 7.4 6.1 - 8.1 g/dL   Albumin 3.7 3.6 - 5.1 g/dL   Globulin 3.7 1.9 - 3.7 g/dL (calc)   AG Ratio 1.0 1.0 - 2.5 (calc)   Total Bilirubin 0.8 0.2 - 1.2 mg/dL   Alkaline phosphatase (APISO) 113 37 - 153 U/L   AST 16 10 - 35 U/L   ALT 9 6 - 29 U/L  Iron, TIBC and Ferritin Panel     Status: None   Collection Time: 04/30/23  2:57 PM  Result Value Ref Range   Iron 71 45 - 160 mcg/dL   TIBC 401 027 - 253 mcg/dL (calc)   %SAT 24 16 - 45 % (calc)   Ferritin 106 16 - 288 ng/mL    Radiology CT CHEST ABDOMEN PELVIS WO CONTRAST Result Date: 04/22/2023 CLINICAL DATA:  82 year old female with history of abrupt drop in hematocrit/hemoglobin. Evaluate for retroperitoneal bleeding. History of  non-small cell lung cancer. * Tracking Code: BO * EXAM: CT CHEST, ABDOMEN AND PELVIS WITHOUT CONTRAST TECHNIQUE: Multidetector CT imaging of the chest, abdomen and pelvis was performed following the standard protocol without IV contrast. RADIATION DOSE  REDUCTION: This exam was performed according to the departmental dose-optimization program which includes automated exposure control, adjustment of the mA and/or kV according to patient size and/or use of iterative reconstruction technique. COMPARISON:  Chest CT 01/19/2022. PET-CT 01/31/2022. CT of the pelvis 10/31/2022. FINDINGS: CT CHEST FINDINGS Cardiovascular: Heart size is normal. There is no significant pericardial fluid, thickening or pericardial calcification. There is aortic atherosclerosis, as well as atherosclerosis of the great vessels of the mediastinum and the coronary arteries, including calcified atherosclerotic plaque in the left main, left anterior descending, left circumflex and right coronary arteries. Calcifications of the aortic valve. Stent in the innominate artery. Mediastinum/Nodes: No pathologically enlarged mediastinal or hilar lymph nodes. Please note that accurate exclusion of hilar adenopathy is limited on noncontrast CT scans. Esophagus is unremarkable in appearance. No axillary lymphadenopathy. 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 Trace right pleural effusion lying dependently. Diffuse bronchial wall thickening with moderate centrilobular and paraseptal emphysema. No definite suspicious appearing pulmonary nodules or masses are noted. Musculoskeletal: Chronic compression fractures of T7 and T9, similar to prior studies, most severe at T9 where there is up to 70% loss of central vertebral body height. There are no aggressive appearing lytic or blastic lesions noted in the visualized  portions of the skeleton. CT ABDOMEN PELVIS FINDINGS Hepatobiliary: No definite suspicious appearing cystic or solid hepatic lesions are confidently identified on today's noncontrast CT examination. High attenuation lying dependently in the gallbladder, presumably biliary sludge. Pancreas: No definite pancreatic mass or peripancreatic fluid collections or inflammatory changes are noted on today's noncontrast CT examination. Spleen: Unremarkable. Adrenals/Urinary Tract: Multifocal cortical scarring in the kidneys bilaterally. Calcifications associated with both renal hila, favored to be vascular (nonobstructive calculi are not entirely excluded. No definite suspicious renal lesions. No hydroureteronephrosis. Urinary bladder is unremarkable in appearance. Bilateral adrenal glands are normal in appearance. Stomach/Bowel: The appearance of the stomach is normal. No pathologic dilatation of small bowel or colon. Numerous colonic diverticula are noted, without surrounding inflammatory changes to suggest an acute diverticulitis at this time. Thickening of the colonic wall in the region of the sigmoid colon, potentially chronic related to underlying diverticular disease (without focal surrounding inflammatory changes). Normal appendix. Vascular/Lymphatic: Atherosclerosis in the abdominal aorta and pelvic vasculature. 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 No lymphadenopathy noted in the abdomen or pelvis. Reproductive: Uterus and ovaries are atrophic and otherwise unremarkable in appearance. Other: No high attenuation fluid collection in the peritoneal cavity or retroperitoneum to suggest significant posttraumatic hemorrhage. No significant volume of ascites. No pneumoperitoneum. Musculoskeletal: Chronic compression fracture of superior endplate of L1 with 20% loss of anterior vertebral body height. Old healed fractures of the inferior pubic rami bilaterally. Status  post ORIF in the left proximal femur partially imaged. There are no aggressive appearing lytic or blastic lesions noted in the visualized portions of the skeleton. IMPRESSION: 1. No unexpected peritoneal or retroperitoneal hematoma. 2. Colonic diverticulosis without evidence of acute diverticulitis at this time. There is also some extensive mural thickening in the sigmoid colon which may be simply related to chronic diverticular disease. No discrete colonic mass confidently identified. 3. Biliary sludge in the gallbladder. No findings to suggest an acute cholecystitis at this time. 4. Chronic postradiation changes in the right lung, similar to prior studies. No definitive findings to suggest locally recurrent disease or  metastatic disease in the chest, abdomen or pelvis. 5. Trace right pleural effusion. 6. Aortic atherosclerosis, in addition to left main and three-vessel coronary artery disease. 7. There are calcifications of the aortic valve. Echocardiographic correlation for evaluation of potential valvular dysfunction may be warranted if clinically indicated. 8. Additional incidental findings, as above. Electronically Signed   By: Trudie Reed M.D.   On: 04/22/2023 11:29   PERIPHERAL VASCULAR CATHETERIZATION Result Date: 04/20/2023 See surgical note for result.  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 EEG to evaluate for seizure Level of alertness: Awake, asleep AEDs during EEG study: None Technical aspects: This EEG study was done with scalp electrodes positioned according to the 10-20 International system of electrode placement. Electrical activity was reviewed with  band pass filter of 1-70Hz , sensitivity of 7 uV/mm, display speed of 42mm/sec with a 60Hz  notched filter applied as appropriate. EEG data were recorded continuously and digitally stored.  Video monitoring was available and reviewed as appropriate. Description: The posterior dominant rhythm consists of 8 Hz activity of moderate voltage (25-35 uV) seen predominantly in posterior head regions, symmetric and reactive to eye opening and eye closing. Sleep was characterized by vertex waves, sleep spindles (12 to 14 Hz), maximal frontocentral region. EEG showed intermittent generalized 3 to 6 Hz theta-delta slowing. Hyperventilation and photic stimulation were not performed.   ABNORMALITY - Intermittent slow, generalized IMPRESSION: This study is suggestive of mild diffuse encephalopathy. No seizures or epileptiform discharges were seen throughout the recording. Charlsie Quest   CT ANGIO HEAD NECK W WO CM Result Date: 04/18/2023 CLINICAL DATA:  Abnormality in the basilar artery on MRA, possible acute infarcts on MRI EXAM: CT ANGIOGRAPHY HEAD AND NECK WITH AND WITHOUT CONTRAST TECHNIQUE: Multidetector CT imaging of the head and neck was performed using the standard protocol during bolus administration of intravenous contrast. Multiplanar CT image reconstructions and MIPs were obtained to evaluate the vascular anatomy. Carotid stenosis measurements (when applicable) are obtained utilizing NASCET criteria, using the distal internal carotid diameter as the denominator. RADIATION DOSE REDUCTION: This exam was performed according to the departmental dose-optimization program which includes automated exposure control, adjustment of the mA and/or kV according to patient size and/or use of iterative reconstruction technique. CONTRAST:  75mL OMNIPAQUE IOHEXOL 350 MG/ML SOLN COMPARISON:  07/03/2014 CTA and, correlation is made with 04/18/2023 MRI head and MRA head and 04/17/2023 CT head FINDINGS: CT HEAD FINDINGS Brain: The  infarcts noted on the recent MRI are not apparent on CT. No evidence of new acute infarct, hemorrhage, mass, mass effect, or midline shift. No hydrocephalus or extra-axial fluid collection. Periventricular white matter changes, likely the sequela of chronic small vessel ischemic disease. Vascular: No hyperdense vessel. Atherosclerotic calcifications in the intracranial carotid and vertebral arteries. Skull: Negative for fracture or focal lesion. Sinuses/Orbits: Mucosal thickening in the ethmoid air cells. No acute finding in the orbits. Other: The mastoid air cells are well aerated. CTA NECK FINDINGS Aortic arch: Standard branching. Imaged portion shows no evidence of aneurysm or dissection. Evaluation of the arch vessel origins is limited by beam hardening artifact from the adjacent contrast bolus, but there is at least 50% stenosis at each of the vessel  origins. Right carotid system: No evidence of dissection, occlusion, or hemodynamically significant stenosis (greater than 50%). Atherosclerotic disease at the bifurcation and in the proximal and distal ICA is not hemodynamically significant. Comparatively poor opacification of the right ICA compared to the left may be related to the degree of stenosis at the origin of the brachiocephalic artery. Left carotid system: No evidence of dissection, occlusion, or hemodynamically significant stenosis (greater than 50%). Atherosclerotic disease at the bifurcation and in the proximal and distal ICA is not hemodynamically significant. Vertebral arteries: Poor visualization of the origin of the right vertebral artery secondary to the adjacent contrast bolus. Within that limitation, there is likely severe stenosis at the origin of the right vertebral artery, which is very poorly opacified until the V3 segment (series 10, image 27). Moderate stenosis at the origin of left vertebral artery, which is non dominant. The left vertebral artery is patent through the skull base. No  evidence of dissection. Skeleton: No acute osseous abnormality. Degenerative changes in the cervical spine. Other neck: No acute finding. Upper chest: Emphysema. No focal pulmonary opacity or pleural effusion. Review of the MIP images confirms the above findings CTA HEAD FINDINGS Anterior circulation: Both internal carotid arteries are patent to the termini, with calcifications but without significant stenosis, although the right ICA is poorly opacified. 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 A1 segments patent. Normal anterior communicating artery. Anterior cerebral arteries are patent to their distal aspects without significant stenosis. No M1 stenosis or occlusion. MCA branches perfused to their distal aspects without significant stenosis. Opacification of the right ACA and MCA is improved compared to the right ICA, likely reflecting collateral flow from the right posterior communicating artery. Posterior circulation: Moderate to severe stenosis in the proximal left V4 (series 10, images 155, 152, and 145). The left vertebral artery primarily supplies the left PICA, which is patent. Mild stenosis in the proximal right V4, which is otherwise patent to the vertebrobasilar junction. The right PICA is also patent. Given poor opacification of the extracranial V4, this is likely retrograde. Basilar patent to its distal aspect without significant stenosis. Superior cerebellar arteries patent proximally. Patent P1 segments. The bilateral posterior communicating arteries are patent. PCAs perfused to their distal aspects without significant stenosis. Venous sinuses: As permitted by contrast timing, patent. Anatomic variants: None significant. No evidence of aneurysm or vascular malformation. Review of the MIP images confirms the above findings IMPRESSION: 1. The  infarcts noted on the recent MRI are not apparent on CT. No evidence of new acute infarct. 2. No intracranial large vessel occlusion. 3. 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 4. Poor opacification of the right ICA, which may be related to stenosis at the origin of the brachiocephalic artery. Improved opacification of the right ACA and MCA is likely collateral flow from the posterior communicating artery. 5. Moderate stenosis at the origin of the left vertebral artery, with additional moderate to severe stenosis in the proximal left V4. 6. 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 7. Evaluation of the arch vessel origins is limited by beam hardening artifact from the adjacent contrast bolus, but  there is likely at least 50% stenosis at each of the vessel origins. \ Electronically Signed   By: Wiliam Ke M.D.   On: 04/18/2023 19:41   MR ANGIO HEAD WO CONTRAST Result Date: 04/18/2023 CLINICAL DATA:  Neuro deficit, acute, stroke suspected EXAM: MRA HEAD WITHOUT CONTRAST TECHNIQUE: Angiographic images of the Circle of Willis were acquired using MRA technique without intravenous contrast. 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 Normal flow void was visualized on same day brain MRI. There is reconstitution of the level of the ICA terminus via collaterals from the circle-of-Willis. There is normal arborization in the bilateral MCA territories. Posterior circulation: There is nearly absent flow void in the basilar artery with robust PCOM bilaterally. P2 and P3 segments demonstrate normal flow signal.  Anatomic variants: Fetal PCAs bilaterally. Other: None. IMPRESSION: 1. Asymmetrically decreased flow void in the right petrous, cavernous, and supraclinoid ICA is worrisome for interval development of a new right ICA occlusion. There is reconstitution of the level of the ICA terminus via collaterals from the circle-of-Willis. Recommend further evaluation with a CT head and neck angiogram 2. Nearly absent flow void in the basilar artery with robust PCOM bilaterally. 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 Recommend attention on CTA. These results will be called to the ordering clinician or representative by the Radiologist Assistant, and communication documented in the PACS or [employer]. Electronically Signed   By: Lorenza Cambridge M.D.   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  She has done very well.  Her carotid duplex today shows normal velocities in the common carotid artery consistent with good flow in the innominate artery and 1 to 39% stenosis in the right ICA.  Her brachial artery blood pressures are also equal.  She is doing great.  She will continue her Brilinta and aspirin.  She will continue her statin agent.  We will see her back in 3 to 4 months with duplex.  Essential hypertension blood pressure control important in reducing the progression of atherosclerotic disease. On appropriate oral medications.   Hyperlipidemia lipid control important in reducing the progression of atherosclerotic disease. Continue statin therapy   PAD (peripheral artery disease) (HCC) ABIs are 1.04 on the right and 0.95 on the left.  Her digit pressures are normal.  Her iliac angioplasty appears to be flowing well.  No worrisome symptoms.  Continue dual antiplatelet therapy and statin agent.  Recheck in 6  months.    [name], MD  05/18/2023 2:15 PM    This note was created with Dragon medical transcription system.  Any errors from dictation are purely unintentional

## 2023-05-18 NOTE — Assessment & Plan Note (Signed)
ABIs are 1.04 on the right and 0.95 on the left.  Her digit pressures are normal.  Her iliac angioplasty appears to be flowing well.  No worrisome symptoms.  Continue dual antiplatelet therapy and statin agent.  Recheck in 6 months.

## 2023-05-18 NOTE — Assessment & Plan Note (Signed)
blood pressure control important in reducing the progression of atherosclerotic disease. On appropriate oral medications.  

## 2023-05-18 NOTE — Assessment & Plan Note (Signed)
The patient underwent innominate artery stenting about a month ago for high-grade symptomatic stenosis with previous TIA symptoms.  She has done very well.  Her carotid duplex today shows normal velocities in the common carotid artery consistent with good flow in the innominate artery and 1 to 39% stenosis in the right ICA.  Her brachial artery blood pressures are also equal.  She is doing great.  She will continue her Brilinta and aspirin.  She will continue her statin agent.  We will see her back in 3 to 4 months with duplex.

## 2023-05-18 NOTE — Assessment & Plan Note (Signed)
lipid control important in reducing the progression of atherosclerotic disease. Continue statin therapy  

## 2023-05-21 ENCOUNTER — Telehealth (INDEPENDENT_AMBULATORY_CARE_PROVIDER_SITE_OTHER): Payer: Self-pay

## 2023-05-21 LAB — VAS US ABI WITH/WO TBI
Left ABI: 0.95
Right ABI: 1.04

## 2023-05-21 NOTE — Telephone Encounter (Signed)
I would recommend she reach out to her PCP.  She had anemia and so she could be anemic making her dizzy and lightheaded and she likely needs to have blood work done by her PCP.

## 2023-05-21 NOTE — Telephone Encounter (Signed)
Christine Burnett Bible called stating that she has fallen twice she has been out the hospital . She's unsure of what medication she is taking that's making her lightheaded.   After looking over her chart she hasn't had any medications changed or prescribed by [place]. Dr.Dew stated for her to continue dual therapy which she takes Aspirin and Brilanta.   Please advise

## 2023-05-22 NOTE — Telephone Encounter (Signed)
Left voice mail to call back 

## 2023-05-23 ENCOUNTER — Ambulatory Visit: Payer: Self-pay | Admitting: *Deleted

## 2023-05-23 NOTE — Telephone Encounter (Signed)
  Chief Complaint: Dizziness Symptoms: "Floaty feeling" lightheaded. States "Vein specialist gave me iron tabs and told me to call my doctor for lab work, said probably low iron." Pt fell Monday, bruises, states was seen, "Nothing broken." Frequency: 3-5 days Pertinent Negatives: Patient denies  Disposition: [] ED /[] Urgent Care (no appt availability in office) / [x] Appointment(In office/virtual)/ []  [place] [facility]/ [] Home Care/ [] Refused Recommended Disposition /[]  Mobile Bus/ []  Follow-up with PCP Additional Notes: Appt secured for tomorrow. Care advise provided, pt verbalizes understanding.  Reason for Disposition  [1] MODERATE dizziness (e.g., interferes with normal activities) AND [2] has NOT been evaluated by doctor (or NP/PA) for this  (Exception: Dizziness caused by heat exposure, sudden standing, or poor fluid intake.)  Answer Assessment - Initial Assessment Questions 1. DESCRIPTION: "Describe your dizziness."     Floaty 2. LIGHTHEADED: "Do you feel lightheaded?" (e.g., somewhat faint, woozy, weak upon standing)     Yes 3. VERTIGO: "Do you feel like either you or the room is spinning or tilting?" (i.e. vertigo)     no 4. SEVERITY: "How bad is it?"  "Do you feel like you are going to faint?" "Can you stand and walk?"   - MILD: Feels slightly dizzy, but walking normally.   - MODERATE: Feels unsteady when walking, but not falling; interferes with normal activities (e.g., school, work).   - SEVERE: Unable to walk without falling, or requires assistance to walk without falling; feels like passing out now.      Moderate 5. ONSET:  "When did the dizziness begin?"     3-5 days 6. AGGRAVATING FACTORS: "Does anything make it worse?" (e.g., standing, change in head position)     A little worse going to sitting to standing.  7. HEART RATE: "Can you tell me your heart rate?" "How many beats in 15 seconds?"  (Note: not all patients can do this)       No 8. CAUSE: "What do  you think is causing the dizziness?"     Told maybe blood low 9. RECURRENT SYMPTOM: "Have you had dizziness before?" If Yes, ask: "When was the last time?" "What happened that time?"     Constant 10. OTHER SYMPTOMS: "Do you have any other symptoms?" (e.g., fever, chest pain, vomiting, diarrhea, bleeding)       No  Protocols used: Dizziness - Lightheadedness-A-AH

## 2023-05-23 NOTE — Telephone Encounter (Signed)
Spoke with patient. She  will reach out to her PCP about the issue.

## 2023-05-24 ENCOUNTER — Ambulatory Visit (INDEPENDENT_AMBULATORY_CARE_PROVIDER_SITE_OTHER): Payer: Medicare Other | Admitting: Physician Assistant

## 2023-05-24 VITALS — BP 152/82 | HR 97 | Resp 16 | Ht 66.0 in | Wt 132.0 lb

## 2023-05-24 DIAGNOSIS — R42 Dizziness and giddiness: Secondary | ICD-10-CM | POA: Diagnosis not present

## 2023-05-24 DIAGNOSIS — D649 Anemia, unspecified: Secondary | ICD-10-CM | POA: Diagnosis not present

## 2023-05-24 NOTE — Progress Notes (Signed)
Acute Office Visit   Patient: Christine Burnett   DOB: [birth year]   82 y.o. Female  MRN: [id number] Visit Date: 05/24/2023  Today's healthcare provider: Oswaldo Conroy Lindsie Simar, PA-C  Introduced myself to the patient as a Secondary school teacher and provided education on APPs in clinical practice.    Chief Complaint  Patient presents with   Dizziness    x1 week, has had multiple falls within the last 2 weeks   Subjective    HPI HPI     Dizziness    Additional comments: x1 week, has had multiple falls within the last 2 weeks      Last edited by Dollene Primrose, CMA on 05/24/2023  2:51 PM.       Sallye Ober  Duration: days Description of symptoms: lightheaded Duration of episode: constant  Dizziness frequency: no history of the same She reports fall the week before last where she hit the wall and hit her head- see phone note from PT She has also fallen about 4 days ago - denies hitting head  Provoking factors: none Aggravating factors:  none Triggered by rolling over in bed: no Triggered by bending over: yes Aggravated by head movement: yes-with quick turns  Aggravated by exertion, coughing, loud noises: no Recent head injury: no Recent or current viral symptoms: yes- yesterday had some nasal rhinorrhea. She blew her nose today and there was a small blood clot  History of vasovagal episodes: no Nausea: no Vomiting: no Tinnitus: yes Hearing loss: no Aural fullness: no Headache: yes- sometimes. She is not sure if headaches and more severe dizziness correspond  Photophobia/phonophobia: no Unsteady gait: yes Postural instability: yes Diplopia, dysarthria, dysphagia or weakness: no Related to exertion: no- stays the same whether at rest or exerting herself Pallor: yes Diaphoresis: no Dyspnea: yes- ongoing and chronic  Chest pain: no   She reports she would like her blood drawn for anemia work up  She reports her Vascular doctor told her to get work up for anemia in connection to  her dizziness   She has been taking iron supplement every other day as prescribed   Medications: Outpatient Medications Prior to Visit  Medication Sig   albuterol (VENTOLIN HFA) 108 (90 Base) MCG/ACT inhaler Inhale 2 puffs into the lungs every 4 (four) hours as needed for wheezing or shortness of breath.   aspirin 81 MG EC tablet Take 81 mg by mouth at bedtime. Swallow whole.   atorvastatin (LIPITOR) 40 MG tablet Take 1 tablet (40 mg total) by mouth at bedtime.   Calcium 200 MG TABS Take 300 mg by mouth every Monday, Tuesday, Wednesday, Thursday, and Friday.   calcium carbonate (TUMS - DOSED IN MG ELEMENTAL CALCIUM) 500 MG chewable tablet Chew 1.5 tablets (300 mg of elemental calcium total) by mouth every Monday, Tuesday, Wednesday, Thursday, and Friday.   Cholecalciferol (VITAMIN D3) 50 MCG (2000 UT) TABS Take 2,000 Units by mouth at bedtime.   ferrous sulfate 325 (65 FE) MG EC tablet Take 1 tablet (325 mg total) by mouth every other day.   fluticasone furoate-vilanterol (BREO ELLIPTA) 100-25 MCG/ACT AEPB Inhale 1 puff into the lungs daily.   melatonin 5 MG TABS Take 0.5 tablets (2.5 mg total) by mouth at bedtime.   Multiple Vitamin (MULTIVITAMIN) capsule Take 1 capsule by mouth daily.   PARoxetine (PAXIL) 40 MG tablet Take 1 tablet (40 mg total) by mouth every morning.   senna-docusate (SENOKOT-S) 8.6-50 MG tablet Take 1  tablet by mouth at bedtime as needed for moderate constipation.   ticagrelor (BRILINTA) 90 MG TABS tablet Take 1 tablet (90 mg total) by mouth 2 (two) times daily.   albuterol (PROVENTIL) (2.5 MG/3ML) 0.083% nebulizer solution Take 3 mLs (2.5 mg total) by nebulization every 4 (four) hours as needed for shortness of breath or wheezing. (Patient not taking: Reported on 04/24/2023)   busPIRone (BUSPAR) 5 MG tablet Take 1 tablet (5 mg total) by mouth 2 (two) times daily as needed (stress anxiety). (Patient not taking: Reported on 04/24/2023)   melatonin 3 MG TABS tablet Take 3 mg  by mouth at bedtime. (Patient not taking: Reported on 04/24/2023)   No facility-administered medications prior to visit.    Review of Systems  Neurological:  Positive for dizziness, light-headedness and headaches.   See HPI for further relevant ROS      Objective    BP (!) 152/82   Pulse 97   Resp 16   Ht 5\' 6"  (1.676 m)   Wt 132 lb (59.9 kg)   SpO2 93%   BMI 21.31 kg/m     Physical Exam Vitals reviewed.  Constitutional:      General: She is awake.     Appearance: Normal appearance. She is well-developed and well-groomed.  HENT:     Head: Normocephalic and atraumatic.     Right Ear: Hearing, tympanic membrane and ear canal normal.     Left Ear: Hearing, tympanic membrane and ear canal normal.     Mouth/Throat:     Lips: Pink.     Pharynx: Oropharynx is clear. Uvula midline. No pharyngeal swelling, oropharyngeal exudate, posterior oropharyngeal erythema, uvula swelling or postnasal drip.  Eyes:     General: Lids are normal. Gaze aligned appropriately.     Extraocular Movements: Extraocular movements intact.     Right eye: Normal extraocular motion and no nystagmus.     Left eye: Normal extraocular motion and no nystagmus.     Conjunctiva/sclera: Conjunctivae normal.     Pupils: Pupils are equal, round, and reactive to light.  Pulmonary:     Effort: Pulmonary effort is normal.  Skin:    General: Skin is warm and dry.     Findings: Bruising and ecchymosis present.     Comments: Significant bruising along left side of body from recent fall   Neurological:     General: No focal deficit present.     Mental Status: She is alert and oriented to person, place, and time.     GCS: GCS eye subscore is 4. GCS verbal subscore is 5. GCS motor subscore is 6.     Cranial Nerves: No cranial nerve deficit, dysarthria or facial asymmetry.     Motor: No tremor.     Comments: Comments: MENTAL STATUS: AAOx3, memory intact, fund of knowledge appropriate   LANG/SPEECH: Naming and  repetition intact, fluent, no dysarthria, follows 3-step commands, answers questions appropriately     CRANIAL NERVES:   II: Pupils equal and reactive, no RAPD   III, IV, VI: EOM intact, no gaze preference or deviation, no nystagmus.   V: normal sensation in V1, V2, and V3 segments bilaterally   VII: no asymmetry, no nasolabial fold flattening   VIII: normal hearing to speech   IX, X: normal palatal elevation, no uvular deviation   XI: 5/5 head turn and 5/5 shoulder shrug bilaterally   XII: midline tongue protrusion     Psychiatric:        Mood  and Affect: Mood normal.        Behavior: Behavior normal. Behavior is cooperative.        Thought Content: Thought content normal.        Judgment: Judgment normal.       No results found for any visits on 05/24/23.  Assessment & Plan      No follow-ups on file.      Problem List Items Addressed This Visit   None Visit Diagnoses       Dizziness    -  Primary   Relevant Orders   CBC w/Diff/Platelet   COMPLETE METABOLIC PANEL WITH GFR   Iron, TIBC and Ferritin Panel   B12 and Folate Panel      Acute, new concern  Reports lightheadedness and dizziness that has led to several falls lately  Suspect this may be secondary to anemia- will check CMP, CBC, iron panel, B12, folate  Limited neurological exam appears overall normal today and no nystagmus or abnormal EOM on exam so I am less suspicious of vertigo or neurological change. Results to dictate further management For now recommend that she makes slow transition changes, walks with assistance or with assistive device Follow-up as needed for progressing or persistent symptoms   No follow-ups on file.   I, Eimy Plaza E Crystalmarie Yasin, PA-C, have reviewed all documentation for this visit. The documentation on 05/24/23 for the exam, diagnosis, procedures, and orders are all accurate and complete.   Jacquelin Hawking, MHS, PA-C [facility]

## 2023-05-25 DIAGNOSIS — Z48812 Encounter for surgical aftercare following surgery on the circulatory system: Secondary | ICD-10-CM | POA: Diagnosis not present

## 2023-05-25 DIAGNOSIS — J449 Chronic obstructive pulmonary disease, unspecified: Secondary | ICD-10-CM | POA: Diagnosis not present

## 2023-05-25 DIAGNOSIS — N179 Acute kidney failure, unspecified: Secondary | ICD-10-CM | POA: Diagnosis not present

## 2023-05-25 DIAGNOSIS — D631 Anemia in chronic kidney disease: Secondary | ICD-10-CM | POA: Diagnosis not present

## 2023-05-25 DIAGNOSIS — I129 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 stage 4 chronic kidney disease, or unspecified chronic kidney disease: Secondary | ICD-10-CM | POA: Diagnosis not present

## 2023-05-25 DIAGNOSIS — N1832 Chronic kidney disease, stage 3b: Secondary | ICD-10-CM | POA: Diagnosis not present

## 2023-05-25 LAB — CBC WITH DIFFERENTIAL/PLATELET
Absolute Lymphocytes: 959 {cells}/uL (ref 850–3900)
Absolute Monocytes: 428 {cells}/uL (ref 200–950)
Basophils Absolute: 41 {cells}/uL (ref 0–200)
Basophils Relative: 0.6 %
Eosinophils Absolute: 152 {cells}/uL (ref 15–500)
Eosinophils Relative: 2.2 %
HCT: 34.7 % — ABNORMAL LOW (ref 35.0–45.0)
Hemoglobin: 11.1 g/dL — ABNORMAL LOW (ref 11.7–15.5)
MCH: 32.4 pg (ref 27.0–33.0)
MCHC: 32 g/dL (ref 32.0–36.0)
MCV: 101.2 fL — ABNORMAL HIGH (ref 80.0–100.0)
MPV: 11 fL (ref 7.5–12.5)
Monocytes Relative: 6.2 %
Neutro Abs: 5320 {cells}/uL (ref 1500–7800)
Neutrophils Relative %: 77.1 %
Platelets: 234 10*3/uL (ref 140–400)
RBC: 3.43 10*6/uL — ABNORMAL LOW (ref 3.80–5.10)
RDW: 14.7 % (ref 11.0–15.0)
Total Lymphocyte: 13.9 %
WBC: 6.9 10*3/uL (ref 3.8–10.8)

## 2023-05-25 LAB — IRON,TIBC AND FERRITIN PANEL
%SAT: 26 % (ref 16–45)
Ferritin: 44 ng/mL (ref 16–288)
Iron: 89 ug/dL (ref 45–160)
TIBC: 341 ug/dL (ref 250–450)

## 2023-05-25 LAB — COMPLETE METABOLIC PANEL WITH GFR
AG Ratio: 1.2 (calc) (ref 1.0–2.5)
ALT: 10 U/L (ref 6–29)
AST: 17 U/L (ref 10–35)
Albumin: 4.2 g/dL (ref 3.6–5.1)
Alkaline phosphatase (APISO): 111 U/L (ref 37–153)
BUN/Creatinine Ratio: 17 (calc) (ref 6–22)
BUN: 27 mg/dL — ABNORMAL HIGH (ref 7–25)
CO2: 28 mmol/L (ref 20–32)
Calcium: 9.9 mg/dL (ref 8.6–10.4)
Chloride: 102 mmol/L (ref 98–110)
Creat: 1.58 mg/dL — ABNORMAL HIGH (ref 0.60–0.95)
Globulin: 3.4 g/dL (ref 1.9–3.7)
Glucose, Bld: 95 mg/dL (ref 65–99)
Potassium: 4.4 mmol/L (ref 3.5–5.3)
Sodium: 138 mmol/L (ref 135–146)
Total Bilirubin: 0.8 mg/dL (ref 0.2–1.2)
Total Protein: 7.6 g/dL (ref 6.1–8.1)
eGFR: 32 mL/min/{1.73_m2} — ABNORMAL LOW (ref >=60)

## 2023-05-25 LAB — B12 AND FOLATE PANEL
Folate: >24 ng/mL
Vitamin B-12: 410 pg/mL (ref 200–1100)

## 2023-05-26 DIAGNOSIS — E785 Hyperlipidemia, unspecified: Secondary | ICD-10-CM | POA: Diagnosis not present

## 2023-05-26 DIAGNOSIS — Z7902 Long term (current) use of antithrombotics/antiplatelets: Secondary | ICD-10-CM | POA: Diagnosis not present

## 2023-05-26 DIAGNOSIS — Z9181 History of falling: Secondary | ICD-10-CM | POA: Diagnosis not present

## 2023-05-26 DIAGNOSIS — N179 Acute kidney failure, unspecified: Secondary | ICD-10-CM | POA: Diagnosis not present

## 2023-05-26 DIAGNOSIS — Z48812 Encounter for surgical aftercare following surgery on the circulatory system: Secondary | ICD-10-CM | POA: Diagnosis not present

## 2023-05-26 DIAGNOSIS — F419 Anxiety disorder, unspecified: Secondary | ICD-10-CM | POA: Diagnosis not present

## 2023-05-26 DIAGNOSIS — Z9981 Dependence on supplemental oxygen: Secondary | ICD-10-CM | POA: Diagnosis not present

## 2023-05-26 DIAGNOSIS — Z7951 Long term (current) use of inhaled steroids: Secondary | ICD-10-CM | POA: Diagnosis not present

## 2023-05-26 DIAGNOSIS — J449 Chronic obstructive pulmonary disease, unspecified: Secondary | ICD-10-CM | POA: Diagnosis not present

## 2023-05-26 DIAGNOSIS — I129 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 stage 4 chronic kidney disease, or unspecified chronic kidney disease: Secondary | ICD-10-CM | POA: Diagnosis not present

## 2023-05-26 DIAGNOSIS — I083 Combined rheumatic disorders of mitral, aortic and tricuspid valves: Secondary | ICD-10-CM | POA: Diagnosis not present

## 2023-05-26 DIAGNOSIS — D631 Anemia in chronic kidney disease: Secondary | ICD-10-CM | POA: Diagnosis not present

## 2023-05-26 DIAGNOSIS — M1990 Unspecified osteoarthritis, unspecified site: Secondary | ICD-10-CM | POA: Diagnosis not present

## 2023-05-26 DIAGNOSIS — F32A Depression, unspecified: Secondary | ICD-10-CM | POA: Diagnosis not present

## 2023-05-26 DIAGNOSIS — Z7982 Long term (current) use of aspirin: Secondary | ICD-10-CM | POA: Diagnosis not present

## 2023-05-26 DIAGNOSIS — N1832 Chronic kidney disease, stage 3b: Secondary | ICD-10-CM | POA: Diagnosis not present

## 2023-05-26 DIAGNOSIS — K5730 Diverticulosis of large intestine without perforation or abscess without bleeding: Secondary | ICD-10-CM | POA: Diagnosis not present

## 2023-05-26 DIAGNOSIS — I700 Atherosclerosis of aorta: Secondary | ICD-10-CM | POA: Diagnosis not present

## 2023-06-05 NOTE — Progress Notes (Signed)
Your labs are back  Your CBC demonstrates a mild anemia.  It appears that this has been improving since it was last checked about a month ago. Your electrolytes are overall normal.  Your kidney function is decreased but appears stable compared to previous results Your iron and ferritin levels are normal. Your B12 and folate levels are within normal range as well.

## 2023-06-06 DIAGNOSIS — K922 Gastrointestinal hemorrhage, unspecified: Secondary | ICD-10-CM

## 2023-06-06 HISTORY — DX: Gastrointestinal hemorrhage, unspecified: K92.2

## 2023-06-11 ENCOUNTER — Other Ambulatory Visit: Payer: Self-pay

## 2023-06-11 ENCOUNTER — Ambulatory Visit: Payer: Self-pay | Admitting: *Deleted

## 2023-06-11 ENCOUNTER — Emergency Department: Payer: Medicare Other

## 2023-06-11 ENCOUNTER — Emergency Department
Admission: EM | Admit: 2023-06-11 | Discharge: 2023-06-11 | Disposition: A | Discharge status: Home / Self Care | Payer: Medicare Other | Attending: Emergency Medicine | Admitting: Emergency Medicine

## 2023-06-11 DIAGNOSIS — I6782 Cerebral ischemia: Secondary | ICD-10-CM | POA: Diagnosis not present

## 2023-06-11 DIAGNOSIS — R42 Dizziness and giddiness: Secondary | ICD-10-CM | POA: Diagnosis not present

## 2023-06-11 DIAGNOSIS — N183 Chronic kidney disease, stage 3 unspecified: Secondary | ICD-10-CM | POA: Diagnosis not present

## 2023-06-11 DIAGNOSIS — W19XXXA Unspecified fall, initial encounter: Secondary | ICD-10-CM

## 2023-06-11 DIAGNOSIS — W01198A Fall on same level from slipping, tripping and stumbling with subsequent striking against other object, initial encounter: Secondary | ICD-10-CM | POA: Diagnosis not present

## 2023-06-11 DIAGNOSIS — I129 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 stage 4 chronic kidney disease, or unspecified chronic kidney disease: Secondary | ICD-10-CM | POA: Insufficient documentation

## 2023-06-11 LAB — COMPREHENSIVE METABOLIC PANEL
ALT: 18 U/L (ref 0–44)
AST: 23 U/L (ref 15–41)
Albumin: 4 g/dL (ref 3.5–5.0)
Alkaline Phosphatase: 110 U/L (ref 38–126)
Anion gap: 13 (ref 5–15)
BUN: 30 mg/dL — ABNORMAL HIGH (ref 8–23)
CO2: 23 mmol/L (ref 22–32)
Calcium: 10.1 mg/dL (ref 8.9–10.3)
Chloride: 104 mmol/L (ref 98–111)
Creatinine, Ser: 1.64 mg/dL — ABNORMAL HIGH (ref 0.44–1.00)
GFR, Estimated: 31 mL/min — ABNORMAL LOW (ref >60)
Glucose, Bld: 113 mg/dL — ABNORMAL HIGH (ref 70–99)
Potassium: 4.8 mmol/L (ref 3.5–5.1)
Sodium: 140 mmol/L (ref 135–145)
Total Bilirubin: 0.8 mg/dL (ref 0.0–1.2)
Total Protein: 8.2 g/dL — ABNORMAL HIGH (ref 6.5–8.1)

## 2023-06-11 LAB — TROPONIN I (HIGH SENSITIVITY)
Troponin I (High Sensitivity): 14 ng/L (ref <18)
Troponin I (High Sensitivity): 13 ng/L (ref <18)

## 2023-06-11 LAB — CBC WITH DIFFERENTIAL/PLATELET
Abs Immature Granulocytes: 0.04 10*3/uL (ref 0.00–0.07)
Basophils Absolute: 0 10*3/uL (ref 0.0–0.1)
Basophils Relative: 1 %
Eosinophils Absolute: 0.1 10*3/uL (ref 0.0–0.5)
Eosinophils Relative: 2 %
HCT: 33.7 % — ABNORMAL LOW (ref 36.0–46.0)
Hemoglobin: 11 g/dL — ABNORMAL LOW (ref 12.0–15.0)
Immature Granulocytes: 1 %
Lymphocytes Relative: 17 %
Lymphs Abs: 1.3 10*3/uL (ref 0.7–4.0)
MCH: 33.3 pg (ref 26.0–34.0)
MCHC: 32.6 g/dL (ref 30.0–36.0)
MCV: 102.1 fL — ABNORMAL HIGH (ref 80.0–100.0)
Monocytes Absolute: 0.5 10*3/uL (ref 0.1–1.0)
Monocytes Relative: 6 %
Neutro Abs: 5.6 10*3/uL (ref 1.7–7.7)
Neutrophils Relative %: 73 %
Platelets: 229 10*3/uL (ref 150–400)
RBC: 3.3 MIL/uL — ABNORMAL LOW (ref 3.87–5.11)
RDW: 15.9 % — ABNORMAL HIGH (ref 11.5–15.5)
WBC: 7.6 10*3/uL (ref 4.0–10.5)
nRBC: 0 % (ref 0.0–0.2)

## 2023-06-11 LAB — MAGNESIUM: Magnesium: 2.4 mg/dL (ref 1.7–2.4)

## 2023-06-11 NOTE — ED Notes (Signed)
 MD Wells notified of patient's elevated BP.

## 2023-06-11 NOTE — Discharge Instructions (Signed)
 Please follow-up with your primary care provider this week for ongoing assessment of your lightheaded symptoms and falls.  Please return immediately if you have worsening symptoms or true passing out episodes.

## 2023-06-11 NOTE — Telephone Encounter (Signed)
  Chief Complaint: s/p multiple falls and c/o visual issues seeing triple Symptoms: fell approx 2 1/2 weeks ago in laundry room , got dizzy and loss balance fell on floor hitting left side of head and left shoulder. Takes brilinta . C/o see triple  vision started last night . No other sx reported. Frequency: today  Pertinent Negatives: Patient denies chest pain reports breathing issues due to hx COPD Disposition: [] ED /[] Urgent Care (no appt availability in office) / [] Appointment(In office/virtual)/ []  [facility]/ [] Home Care/ [x] Refused Recommended Disposition /[] [facility]/ []  Follow-up with PCP Additional Notes:   Recommended ED now or to call 911 if no transportation . Patient refused and does not want to go to ED or call 911. Patient reports she will call daughter and would like OV. Please advise if OV appropriate.        Reason for Disposition  Patient sounds very sick or weak to the triager  Answer Assessment - Initial Assessment Questions 1. MECHANISM: How did the fall happen?     Dizziness while in laundry room and loss balance hit left shoulder and head 2. DOMESTIC VIOLENCE AND ELDER ABUSE SCREENING: Did you fall because someone pushed you or tried to hurt you? If Yes, ask: Are you safe now?     na 3. ONSET: When did the fall happen? (e.g., minutes, hours, or days ago)     2 or 2 1/2 weeks ago  4. LOCATION: What part of the body hit the ground? (e.g., back, buttocks, head, hips, knees, hands, head, stomach)     Left shoulder , left side of head 5. INJURY: Did you hurt (injure) yourself when you fell? If Yes, ask: What did you injure? Tell me more about this? (e.g., body area; type of injury; pain severity)     na 6. PAIN: Is there any pain? If Yes, ask: How bad is the pain? (e.g., Scale 1-10; or mild,  moderate, severe)   - NONE (0): No pain   - MILD (1-3): Doesn't interfere with normal activities    - MODERATE (4-7):  Interferes with normal activities or awakens from sleep    - SEVERE (8-10): Excruciating pain, unable to do any normal activities      No pain  7. SIZE: For cuts, bruises, or swelling, ask: How large is it? (e.g., inches or centimeters)      Left shoulder bruised  8. PREGNANCY: Is there any chance you are pregnant? When was your last menstrual period?     na 9. OTHER SYMPTOMS: Do you have any other symptoms? (e.g., dizziness, fever, weakness; new onset or worsening).      Dizziness mild now , legs a little weak using walker. Triple vision  10. CAUSE: What do you think caused the fall (or falling)? (e.g., tripped, dizzy spell)       Dizziness and loss balance  Protocols used: Falls and [facility]

## 2023-06-11 NOTE — ED Triage Notes (Signed)
 Pt has had 3 falls in the past 2 weeks and c/o dizziness. Pt says she has hit her head, denies LOC but is concerned because she is on Brilinta . Pt c/o pain to her tail bone and a knot. Pt said this morning when she woke up her eyes felt like they were moving involuntarily and she was having triple vision.

## 2023-06-11 NOTE — Telephone Encounter (Signed)
 FYI

## 2023-06-11 NOTE — ED Provider Notes (Signed)
 [facility] Provider Note    Event Date/Time   First MD Initiated Contact with Patient 06/11/23 1827     (approximate)   History   Fall   HPI Christine Burnett is a 83 y.o. female with history of HTN, CKD stage III, COPD presenting today for lightheadedness and fall.  Patient states over the past 3 weeks she has had 4 episodes of light headedness that have resulted in falls.  She denies ever having loss of consciousness.  She usually feels it coming on but still gets off balance.  She denies any room spinning sensation.  Denies chest pain, palpitations, nausea, vomiting, shortness of breath, abdominal pain, dysuria, leg pain, leg swelling.  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  Has been seen by her PCP and vascular surgery within the past month for similar symptoms.  Reviewed most recent outpatient notes.     Physical Exam   Triage Vital Signs: ED Triage Vitals  Encounter Vitals Group     BP 06/11/23 1541 (!) 175/101     Systolic BP Percentile --      Diastolic BP Percentile --      Pulse Rate 06/11/23 1541 (!) 103     Resp 06/11/23 1541 17     Temp 06/11/23 1541 98 F (36.7 C)     Temp Source 06/11/23 1541 Oral     SpO2 06/11/23 1541 94 %     Weight --      Height --      Head Circumference --      Peak Flow --      Pain Score 06/11/23 1539 3     Pain Loc --      Pain Education --      Exclude from Growth Chart --     Most recent vital signs: Vitals:   06/11/23 1541 06/11/23 1834  BP: (!) 175/101 (!) 193/89  Pulse: (!) 103 90  Resp: 17 18  Temp: 98 F (36.7 C)   SpO2: 94% 99%   Physical Exam: I have reviewed the vital signs and nursing notes. General: Awake, alert, no acute distress.  Nontoxic appearing. Head:  Atraumatic, normocephalic.   ENT:  EOM intact, PERRL. Oral mucosa is pink and moist with no lesions. Neck: Neck is supple with full range of motion, No meningeal  signs. Cardiovascular:  RRR, No murmurs. Peripheral pulses palpable and equal bilaterally. Respiratory:  Symmetrical chest wall expansion.  No rhonchi, rales, or wheezes.  Good air movement throughout.  No use of accessory muscles.   Musculoskeletal:  No cyanosis or edema. Moving extremities with full ROM Abdomen:  Soft, nontender, nondistended. Neuro:  GCS 15, moving all four extremities, interacting appropriately. Speech clear. Psych:  Calm, appropriate.   Skin:  Warm, dry, no rash.    ED Results / Procedures / Treatments   Labs (all labs ordered are listed, but only abnormal results are displayed) Labs Reviewed  CBC WITH DIFFERENTIAL/PLATELET - Abnormal; Notable for the following components:      Result Value   RBC 3.30 (*)    Hemoglobin 11.0 (*)    HCT 33.7 (*)    MCV 102.1 (*)    RDW 15.9 (*)    All other components within normal limits  COMPREHENSIVE METABOLIC PANEL - Abnormal; Notable for the following components:   Glucose, Bld 113 (*)    BUN 30 (*)    Creatinine, Ser 1.64 (*)  Total Protein 8.2 (*)    GFR, Estimated 31 (*)    All other components within normal limits  MAGNESIUM   TROPONIN I (HIGH SENSITIVITY)  TROPONIN I (HIGH SENSITIVITY)     EKG My EKG interpretation: Rate of 102, sinus tachycardia, normal axis, normal intervals.  No acute ST elevations or depressions   RADIOLOGY Independently interpreted CT scan of head with no acute pathology   PROCEDURES:  Critical Care performed: No  Procedures   MEDICATIONS ORDERED IN ED: Medications - No data to display   IMPRESSION / MDM / ASSESSMENT AND PLAN / ED COURSE  I reviewed the triage vital signs and the nursing notes.                              Differential diagnosis includes, but is not limited to, orthostatic hypotension, cardiac arrhythmia, dehydration, vascular insufficiency  Patient's presentation is most consistent with acute complicated illness / injury requiring diagnostic  workup.  Patient is a 83 year old female presenting today for episodes of lightheadedness with fall.  Reportedly did hit her head during 1 of these episodes.  CT head shows no acute traumatic pathology.  Laboratory workup otherwise reassuring.  Troponins negative x 2 and EKG without signs of ischemia.  Possibly some dehydration with elevated BUN but creatinine otherwise consistent with baseline.  Patient denies any syncopal episodes.  Neurological exam completely unremarkable with no concern for CVA.  Although patient is not fully having syncopal episodes, I did recommend admission for further workup given recent history with her carotid stent placement.  Patient stated that she did not want to be admitted to the hospital at this time as she otherwise feels well and just wanted to make sure she did not have injury to her head.  She prefers to have additional workup performed outpatient.  Encouraged her to follow-up with her PCP as soon as possible and gave strict return precautions for worsening symptoms which she was agreeable with.  The patient is on the cardiac monitor to evaluate for evidence of arrhythmia and/or significant heart rate changes.     FINAL CLINICAL IMPRESSION(S) / ED DIAGNOSES   Final diagnoses:  Lightheadedness  Fall, initial encounter     Rx / DC Orders   ED Discharge Orders     None        Note:  This document was prepared using Dragon voice recognition software and may include unintentional dictation errors.   Malvina Alm DASEN, MD 06/11/23 [phone]

## 2023-06-11 NOTE — Telephone Encounter (Signed)
 Trying calling patient . LVM to see about getting her a appt. Please advise

## 2023-06-11 NOTE — Telephone Encounter (Signed)
 Copied from CRM [id number]. Topic: General - Other >> Jun 11, 2023  2:18 PM Santiya F wrote: Reason for CRM: Pt is calling in returning a call from the office regarding an appointment. Informed pt that she could be seen tomorrow on 06/12/23. Pt declined appointment and said she would go to the hospital because she isn't feeling well.

## 2023-06-12 DIAGNOSIS — Z48812 Encounter for surgical aftercare following surgery on the circulatory system: Secondary | ICD-10-CM | POA: Diagnosis not present

## 2023-06-12 DIAGNOSIS — I129 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 stage 4 chronic kidney disease, or unspecified chronic kidney disease: Secondary | ICD-10-CM | POA: Diagnosis not present

## 2023-06-12 DIAGNOSIS — D631 Anemia in chronic kidney disease: Secondary | ICD-10-CM | POA: Diagnosis not present

## 2023-06-12 DIAGNOSIS — N1832 Chronic kidney disease, stage 3b: Secondary | ICD-10-CM | POA: Diagnosis not present

## 2023-06-12 DIAGNOSIS — N179 Acute kidney failure, unspecified: Secondary | ICD-10-CM | POA: Diagnosis not present

## 2023-06-12 DIAGNOSIS — J449 Chronic obstructive pulmonary disease, unspecified: Secondary | ICD-10-CM | POA: Diagnosis not present

## 2023-06-15 DIAGNOSIS — J449 Chronic obstructive pulmonary disease, unspecified: Secondary | ICD-10-CM | POA: Diagnosis not present

## 2023-06-15 DIAGNOSIS — D631 Anemia in chronic kidney disease: Secondary | ICD-10-CM | POA: Diagnosis not present

## 2023-06-15 DIAGNOSIS — Z48812 Encounter for surgical aftercare following surgery on the circulatory system: Secondary | ICD-10-CM | POA: Diagnosis not present

## 2023-06-15 DIAGNOSIS — N179 Acute kidney failure, unspecified: Secondary | ICD-10-CM | POA: Diagnosis not present

## 2023-06-15 DIAGNOSIS — N1832 Chronic kidney disease, stage 3b: Secondary | ICD-10-CM | POA: Diagnosis not present

## 2023-06-15 DIAGNOSIS — I129 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 stage 4 chronic kidney disease, or unspecified chronic kidney disease: Secondary | ICD-10-CM | POA: Diagnosis not present

## 2023-06-18 ENCOUNTER — Ambulatory Visit: Payer: Self-pay

## 2023-06-18 ENCOUNTER — Telehealth: Payer: Medicare Other | Admitting: Physician Assistant

## 2023-06-18 DIAGNOSIS — W19XXXA Unspecified fall, initial encounter: Secondary | ICD-10-CM

## 2023-06-18 DIAGNOSIS — M545 Low back pain, unspecified: Secondary | ICD-10-CM

## 2023-06-18 MED ORDER — METHYLPREDNISOLONE 4 MG PO TBPK: ORAL_TABLET | ORAL | 0 refills | Status: DC

## 2023-06-18 MED ORDER — TIZANIDINE HCL 2 MG PO CAPS: 2.0000 mg | ORAL_CAPSULE | Freq: Two times a day (BID) | ORAL | 0 refills | Status: DC | PRN

## 2023-06-18 NOTE — Progress Notes (Signed)
 Virtual Visit Consent   Christine Burnett, you are scheduled for a virtual visit with a [facility] provider today. Just as with appointments in the office, your consent must be obtained to participate. Your consent will be active for this visit and any virtual visit you may have with one of our providers in the next 365 days. If you have a MyChart account, a copy of this consent can be sent to you electronically.  As this is a virtual visit, video technology does not allow for your provider to perform a traditional examination. This may limit your provider's ability to fully assess your condition. If your provider identifies any concerns that need to be evaluated in person or the need to arrange testing (such as labs, EKG, etc.), we will make arrangements to do so. Although advances in technology are sophisticated, we cannot ensure that it will always work on either your end or our end. If the connection with a video visit is poor, the visit may have to be switched to a telephone visit. With either a video or telephone visit, we are not always able to ensure that we have a secure connection.  By engaging in this virtual visit, you consent to the provision of healthcare and authorize for your insurance to be billed (if applicable) for the services provided during this visit. Depending on your insurance coverage, you may receive a charge related to this service.  I need to obtain your verbal consent now. Are you willing to proceed with your visit today? AMERI CAHOON has provided verbal consent on 06/18/2023 for a virtual visit (video or telephone). Delon CHRISTELLA Dickinson, PA-C  Date: 06/18/2023 4:27 PM  Virtual Visit via Video Note   I, Delon CHRISTELLA Dickinson, connected with  SUA SPADAFORA  ([id number], 11-Jul-1940) on 06/18/23 at  4:15 PM EST by a video-enabled telemedicine application and verified that I am speaking with the correct person using two identifiers.  Location: Patient: Virtual Visit  Location Patient: Home Provider: Virtual Visit Location Provider: Home Office   I discussed the limitations of evaluation and management by telemedicine and the availability of in person appointments. The patient expressed understanding and agreed to proceed.    History of Present Illness: Christine Burnett is a 83 y.o. who identifies as a female who was assigned female at birth, and is being seen today for back pain after a fall.  HPI: Back Pain This is a new problem. The current episode started 1 to 4 weeks ago (Patient fell twice in the last 4 weeks, last fall she hit her head on her bed frame and was having dizziness following so she sougt medical care at the ER; Had CT head negative for acute trauma, no back xray but reports she did not mention back pain either). The problem occurs constantly. The problem is unchanged. The pain is present in the lumbar spine and thoracic spine. The quality of the pain is described as aching, shooting and stabbing. The pain does not radiate. The pain is moderate. The pain is The same all the time. The symptoms are aggravated by coughing. Stiffness is present All day. Pertinent negatives include no bladder incontinence, bowel incontinence, chest pain, fever, headaches, leg pain, numbness, paresis, pelvic pain, perianal numbness, tingling or weakness. She has tried heat (tylenol , salonpas) for the symptoms. The treatment provided mild relief.     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  PAD (peripheral artery disease) (HCC) 05/18/2023   Stenosis of right internal carotid artery 04/19/2023   Internal carotid artery stenosis, right 04/19/2023   Essential hypertension 04/18/2023   Dyslipidemia 04/18/2023   Anxiety and depression 04/18/2023   Chronic obstructive pulmonary disease (COPD) (HCC) 04/18/2023   TIA (transient ischemic attack) 04/17/2023   Inferior pubic ramus fracture, left,  closed, initial encounter (HCC) 11/01/2022   Fall 11/01/2022   Closed fracture of left inferior pubic ramus, initial encounter (HCC) 11/01/2022   Sacral fracture, closed (HCC) 11/01/2022   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   Dyspnea on exertion 05/15/2022   Coronary artery disease    COPD (chronic obstructive pulmonary disease) (HCC) 01/28/2021   CKD (chronic kidney disease) stage 4, GFR 15-29 ml/min (HCC) 01/21/2021   Goals of care, counseling/discussion 07/17/2020   Malignant neoplasm of lower lobe of right lung (HCC) 07/04/2020   Nocturnal hypoxia 03/17/2020   Prediabetes 08/13/2018   Chest pain 09/27/2017   Allergic rhinitis 09/27/2017   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   Stage 3b chronic kidney disease (HCC) 08/31/2017   Hyperparathyroidism, 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   Osteoporosis without current pathological fracture 03/30/2017   Cardiac murmur 04/07/2015   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 stage 4 chronic kidney disease, or unspecified chronic kidney disease 02/10/2015   Anxiety 01/05/2015   Hyperlipidemia 01/05/2015   HTN (hypertension) 09/24/2014    Allergies:  Allergies  Allergen Reactions   Zithromax  [Azithromycin ]    Cefuroxime Axetil Rash   Ciprofloxacin  Nausea Only    Unknown   Codeine Other (See Comments)    Patient does not like how the medication makes you feel    Medications:  Current Outpatient Medications:    methylPREDNISolone  (MEDROL  DOSEPAK) 4 MG TBPK tablet, 6 day taper; take as package on instructions, Disp: 21 tablet, Rfl: 0   tizanidine  (ZANAFLEX ) 2 MG capsule, Take 1 capsule (2 mg total) by mouth 2 (two) times daily as needed for muscle spasms., Disp: 10 capsule, Rfl: 0   albuterol  (PROVENTIL ) (2.5 MG/3ML) 0.083% nebulizer solution, Take 3 mLs (2.5 mg total) by nebulization every 4 (four) hours as needed for shortness of breath or wheezing. (Patient not taking: Reported on  04/24/2023), Disp: , Rfl:    albuterol  (VENTOLIN  HFA) 108 (90 Base) MCG/ACT inhaler, Inhale 2 puffs into the lungs every 4 (four) hours as needed for wheezing or shortness of breath., Disp: 8 g, Rfl: 1   aspirin  81 MG EC tablet, Take 81 mg by mouth at bedtime. Swallow whole., Disp: , Rfl:    atorvastatin  (LIPITOR) 40 MG tablet, Take 1 tablet (40 mg total) by mouth at bedtime., Disp: 90 tablet, Rfl: 1   busPIRone  (BUSPAR ) 5 MG tablet, Take 1 tablet (5 mg total) by mouth 2 (two) times daily as needed (stress anxiety). (Patient not taking: Reported on 04/24/2023), Disp: 60 tablet, Rfl: 1   Calcium  200 MG TABS, Take 300 mg by mouth every Monday, Tuesday, Wednesday, Thursday, and Friday., Disp: , Rfl:    calcium  carbonate (TUMS - DOSED IN MG ELEMENTAL CALCIUM ) 500 MG chewable tablet, Chew 1.5 tablets (300 mg of elemental calcium  total) by mouth every Monday, Tuesday, Wednesday, Thursday, and Friday., Disp: , Rfl:    Cholecalciferol  (VITAMIN D3) 50 MCG (2000 UT) TABS, Take 2,000 Units by mouth at bedtime., Disp: , Rfl:    ferrous sulfate  325 (65 FE) MG EC tablet, Take 1 tablet (325 mg total) by mouth every other day.,  Disp: 60 tablet, Rfl: 0   fluticasone  furoate-vilanterol (BREO ELLIPTA ) 100-25 MCG/ACT AEPB, Inhale 1 puff into the lungs daily., Disp: 1 each, Rfl: 11   melatonin 3 MG TABS tablet, Take 3 mg by mouth at bedtime. (Patient not taking: Reported on 04/24/2023), Disp: , Rfl:    melatonin 5 MG TABS, Take 0.5 tablets (2.5 mg total) by mouth at bedtime., Disp: , Rfl:    Multiple Vitamin (MULTIVITAMIN) capsule, Take 1 capsule by mouth daily., Disp: , Rfl:    PARoxetine  (PAXIL ) 40 MG tablet, Take 1 tablet (40 mg total) by mouth every morning., Disp: 90 tablet, Rfl: 2   senna-docusate (SENOKOT-S) 8.6-50 MG tablet, Take 1 tablet by mouth at bedtime as needed for moderate constipation., Disp: 30 tablet, Rfl: 1   ticagrelor  (BRILINTA ) 90 MG TABS tablet, Take 1 tablet (90 mg total) by mouth 2 (two) times  daily., Disp: 60 tablet, Rfl: 1  Observations/Objective: Patient is well-developed, well-nourished in no acute distress.  Resting comfortably at home.  Head is normocephalic, atraumatic.  No labored breathing.  Speech is clear and coherent with logical content.  Patient is alert and oriented at baseline.    Assessment and Plan: 1. Acute midline low back pain without sciatica (Primary) - methylPREDNISolone  (MEDROL  DOSEPAK) 4 MG TBPK tablet; 6 day taper; take as package on instructions  Dispense: 21 tablet; Refill: 0 - tizanidine  (ZANAFLEX ) 2 MG capsule; Take 1 capsule (2 mg total) by mouth 2 (two) times daily as needed for muscle spasms.  Dispense: 10 capsule; Refill: 0  2. Fall, initial encounter - methylPREDNISolone  (MEDROL  DOSEPAK) 4 MG TBPK tablet; 6 day taper; take as package on instructions  Dispense: 21 tablet; Refill: 0 - tizanidine  (ZANAFLEX ) 2 MG capsule; Take 1 capsule (2 mg total) by mouth 2 (two) times daily as needed for muscle spasms.  Dispense: 10 capsule; Refill: 0  - Suspect back contusion and muscle spasms; DDx does include compression fracture, hematoma, fracture of ribs - Cannot take NSAIDs due to Brilinta  - Add Medrol  dose pack and Tizanidine  - Avoid NSAIDs (Ibuprofen/Advil/Motrin or Naproxen /Aleve ) while on steroid - Tylenol  if okay for breakthrough pain - Heat to area - Epsom salt soak if able to get in and out of bath tub safely - Lidocaine  or Salonpas patches as needed for pain - Seek in person evaluation if worsening or fails to improve with treatment for further evaluation with imaging   Follow Up Instructions: I discussed the assessment and treatment plan with the patient. The patient was provided an opportunity to ask questions and all were answered. The patient agreed with the plan and demonstrated an understanding of the instructions.  A copy of instructions were sent to the patient via MyChart unless otherwise noted below.    The patient was advised  to call back or seek an in-person evaluation if the symptoms worsen or if the condition fails to improve as anticipated.    Delon CHRISTELLA Dickinson, PA-C

## 2023-06-18 NOTE — Telephone Encounter (Signed)
  Chief Complaint: Back pain Symptoms: back pain Frequency: since fall last week Pertinent Negatives: Patient denies  Disposition: [] ED /[] Urgent Care (no appt availability in office) / [] Appointment(In office/virtual)/ [x]  [facility]/ [] Home Care/ [] Refused Recommended Disposition /[]  Mobile Bus/ []  Follow-up with PCP Additional Notes: Pt fell last week and hit her head on the bed's box spring. Pt went to ED, and was released. Now pt has back pain that can take her breath away. Pt feels she needs an xray. Scheduled a VV. Pt has some transportation issues.   Reason for Disposition  MILD weakness (i.e., does not interfere with ability to work, go to school, normal activities)  (Exception: Mild weakness is a chronic symptom.)  Answer Assessment - Initial Assessment Questions 1. MECHANISM: How did the fall happen?     Coming out of the bathroom did not grab door knob, but fell and hit head on the box spring  3. ONSET: When did the fall happen? (e.g., minutes, hours, or days ago)     Lost balance 4. LOCATION: What part of the body hit the ground? (e.g., back, buttocks, head, hips, knees, hands, head, stomach)     Hit head and hurt her back 5. INJURY: Did you hurt (injure) yourself when you fell? If Yes, ask: What did you injure? Tell me more about this? (e.g., body area; type of injury; pain severity)     Hit her head - now her back hurts a lot 6. PAIN: Is there any pain? If Yes, ask: How bad is the pain? (e.g., Scale 1-10; or mild,  moderate, severe)   - NONE (0): No pain   - MILD (1-3): Doesn't interfere with normal activities    - MODERATE (4-7): Interferes with normal activities or awakens from sleep    - SEVERE (8-10): Excruciating pain, unable to do any normal activities      Moderate - severe with certain movements 9. OTHER SYMPTOMS: Do you have any other symptoms? (e.g., dizziness, fever, weakness; new onset or worsening).      no 10. CAUSE:  What do you think caused the fall (or falling)? (e.g., tripped, dizzy spell)       Lost balance  Protocols used: Falls and [facility]

## 2023-06-18 NOTE — Patient Instructions (Signed)
 Christine Burnett, thank you for joining Christine CHRISTELLA Dickinson, PA-C for today's virtual visit.  While this provider is not your primary care provider (PCP), if your PCP is located in our provider database this encounter information will be shared with them immediately following your visit.   A [facility] MyChart account gives you access to today's visit and all your visits, tests, and labs performed at [facility]  click here if you don't have a [name] account or go to [email or web address]  Consent: (Patient) Christine Burnett provided verbal consent for this virtual visit at the beginning of the encounter.  Current Medications:  Current Outpatient Medications:    methylPREDNISolone  (MEDROL  DOSEPAK) 4 MG TBPK tablet, 6 day taper; take as package on instructions, Disp: 21 tablet, Rfl: 0   tizanidine  (ZANAFLEX ) 2 MG capsule, Take 1 capsule (2 mg total) by mouth 2 (two) times daily as needed for muscle spasms., Disp: 10 capsule, Rfl: 0   albuterol  (PROVENTIL ) (2.5 MG/3ML) 0.083% nebulizer solution, Take 3 mLs (2.5 mg total) by nebulization every 4 (four) hours as needed for shortness of breath or wheezing. (Patient not taking: Reported on 04/24/2023), Disp: , Rfl:    albuterol  (VENTOLIN  HFA) 108 (90 Base) MCG/ACT inhaler, Inhale 2 puffs into the lungs every 4 (four) hours as needed for wheezing or shortness of breath., Disp: 8 g, Rfl: 1   aspirin  81 MG EC tablet, Take 81 mg by mouth at bedtime. Swallow whole., Disp: , Rfl:    atorvastatin  (LIPITOR) 40 MG tablet, Take 1 tablet (40 mg total) by mouth at bedtime., Disp: 90 tablet, Rfl: 1   busPIRone  (BUSPAR ) 5 MG tablet, Take 1 tablet (5 mg total) by mouth 2 (two) times daily as needed (stress anxiety). (Patient not taking: Reported on 04/24/2023), Disp: 60 tablet, Rfl: 1   Calcium  200 MG TABS, Take 300 mg by mouth every Monday, Tuesday, Wednesday, Thursday, and Friday., Disp: , Rfl:    calcium  carbonate (TUMS - DOSED  IN MG ELEMENTAL CALCIUM ) 500 MG chewable tablet, Chew 1.5 tablets (300 mg of elemental calcium  total) by mouth every Monday, Tuesday, Wednesday, Thursday, and Friday., Disp: , Rfl:    Cholecalciferol  (VITAMIN D3) 50 MCG (2000 UT) TABS, Take 2,000 Units by mouth at bedtime., Disp: , Rfl:    ferrous sulfate  325 (65 FE) MG EC tablet, Take 1 tablet (325 mg total) by mouth every other day., Disp: 60 tablet, Rfl: 0   fluticasone  furoate-vilanterol (BREO ELLIPTA ) 100-25 MCG/ACT AEPB, Inhale 1 puff into the lungs daily., Disp: 1 each, Rfl: 11   melatonin 3 MG TABS tablet, Take 3 mg by mouth at bedtime. (Patient not taking: Reported on 04/24/2023), Disp: , Rfl:    melatonin 5 MG TABS, Take 0.5 tablets (2.5 mg total) by mouth at bedtime., Disp: , Rfl:    Multiple Vitamin (MULTIVITAMIN) capsule, Take 1 capsule by mouth daily., Disp: , Rfl:    PARoxetine  (PAXIL ) 40 MG tablet, Take 1 tablet (40 mg total) by mouth every morning., Disp: 90 tablet, Rfl: 2   senna-docusate (SENOKOT-S) 8.6-50 MG tablet, Take 1 tablet by mouth at bedtime as needed for moderate constipation., Disp: 30 tablet, Rfl: 1   ticagrelor  (BRILINTA ) 90 MG TABS tablet, Take 1 tablet (90 mg total) by mouth 2 (two) times daily., Disp: 60 tablet, Rfl: 1   Medications ordered in this encounter:  Meds ordered this encounter  Medications   methylPREDNISolone  (MEDROL  DOSEPAK) 4 MG TBPK tablet    Sig: 6  day taper; take as package on instructions    Dispense:  21 tablet    Refill:  0    Supervising Provider:   LAMPTEY, PHILIP O [[id number]]   tizanidine  (ZANAFLEX ) 2 MG capsule    Sig: Take 1 capsule (2 mg total) by mouth 2 (two) times daily as needed for muscle spasms.    Dispense:  10 capsule    Refill:  0    Supervising Provider:   BLAISE ALEENE KIDD [[id number]]     *If you need refills on other medications prior to your next appointment, please contact your pharmacy*  Follow-Up: Call back or seek an in-person evaluation if the symptoms worsen or  if the condition fails to improve as anticipated.  [facility] [phone]  Other Instructions Contusion A contusion is a deep bruise. Contusions are the result of a blunt injury to tissues and muscle fibers under the skin. The injury causes bleeding under the skin. The skin over the contusion may turn blue, purple, or yellow. Minor injuries will give you a painless contusion, but more severe injuries cause contusions that can stay painful and swollen for a few weeks. Follow these instructions at home: Pay attention to any changes in your symptoms. Let your health care provider know about them. Take these actions to relieve your pain. Managing pain, stiffness, and swelling  Use resting, icing, applying pressure (compression), and raising (elevating) the injured area. This is often called the RICE method. Rest the injured area. Return to your normal activities as told by your health care provider. Ask your health care provider what activities are safe for you. If directed, put ice on the injured area. To do this: Put ice in a plastic bag. Place a towel between your skin and the bag. Leave the ice on for 20 minutes, 2-3 times a day. If your skin turns bright red, remove the ice right away to prevent skin damage. The risk of skin damage is higher if you cannot feel pain, heat, or cold. If directed, apply light compression to the injured area using an elastic bandage. Make sure the bandage is not wrapped too tightly. Remove and reapply the bandage as directed by your health care provider. If possible, elevate the injured area above the level of your heart while you are sitting or lying down. General instructions Take over-the-counter and prescription medicines only as told by your health care provider. Keep all follow-up visits. Your health care provider may want to see how your contusion is healing with treatment. Contact a health care provider if: Your symptoms do not improve  after several days of treatment. Your symptoms get worse. You have difficulty moving the injured area. Get help right away if: You have severe pain. You have numbness in a hand or foot. Your hand or foot turns pale or cold. This information is not intended to replace advice given to you by your health care provider. Make sure you discuss any questions you have with your health care provider. Document Revised: 11/07/2021 Document Reviewed: 11/07/2021 Elsevier Patient Education  2024 [employer]    If you have been instructed to have an in-person evaluation today at a local Urgent Care facility, please use the link below. It will take you to a list of all of our available [facility], including address, phone number and hours of operation. Please do not delay care.  [facility]  If you or a family member do not have a  primary care provider, use the link below to schedule a visit and establish care. When you choose a [facility] primary care physician or advanced practice provider, you gain a long-term partner in health. Find a Primary Care Provider  Learn more about [facility]'s in-office and virtual care options: [facility] - Get Care Now

## 2023-06-21 ENCOUNTER — Ambulatory Visit: Payer: Self-pay

## 2023-06-21 ENCOUNTER — Ambulatory Visit (INDEPENDENT_AMBULATORY_CARE_PROVIDER_SITE_OTHER): Payer: Medicare Other | Admitting: Physician Assistant

## 2023-06-21 ENCOUNTER — Encounter: Payer: Self-pay | Admitting: Physician Assistant

## 2023-06-21 VITALS — BP 132/78 | HR 87 | Resp 16 | Ht 66.0 in | Wt 126.0 lb

## 2023-06-21 DIAGNOSIS — K439 Ventral hernia without obstruction or gangrene: Secondary | ICD-10-CM

## 2023-06-21 DIAGNOSIS — B029 Zoster without complications: Secondary | ICD-10-CM

## 2023-06-21 MED ORDER — ACYCLOVIR 800 MG PO TABS: 800.0000 mg | ORAL_TABLET | Freq: Four times a day (QID) | ORAL | 0 refills | Status: DC

## 2023-06-21 NOTE — Progress Notes (Signed)
Acute Office Visit   Patient: Christine Burnett   DOB: 03-27-1941   83 y.o. Female  MRN: [id number] Visit Date: 06/21/2023  Today's healthcare provider: Oswaldo Conroy Baruch Lewers, PA-C  Introduced myself to the patient as a Secondary school teacher and provided education on APPs in clinical practice.    Chief Complaint  Patient presents with   Abdominal Pain    Larey Seat 06/11/23. Possible hernia.   Subjective    HPI HPI     Abdominal Pain    Additional comments: Larey Seat 06/11/23. Possible hernia.      Last edited by Dollene Primrose, CMA on 06/21/2023  3:30 PM.       Patient is here with her daughter who is assisting with HPI   Discussed the use of AI scribe software for clinical note transcription with the patient, who gave verbal consent to proceed.  History of Present Illness   The patient, with a history of falls, presented with persistent back pain following a recent fall. The fall occurred in January when the patient missed a doorknob and fell onto her left side. The patient reported that the back pain has been constant since the fall. She was prescribed a Medrol Dosepak and tizanidine for the pain, which she reported has been effective in reducing the pain for about four hours when taken with Tylenol. However, the patient also reported experiencing dizziness after taking the tizanidine.  In addition to the back pain, the patient also reported abdominal pain. She has a history of hernias, which she believes have been exacerbated by her frequent falls. The patient reported that the hernias are painful and bulge out to the point where she cannot be pushed back in.  The patient also reported a new rash on her abdomen and back, which she described as feeling like a pinch when touched. The rash was not present two days prior to the consultation. The patient also reported a sensation of something "wiggling" on the side of her abdomen. She denied any fever, chills, or trouble going to the bathroom.        Medications: Outpatient Medications Prior to Visit  Medication Sig   albuterol (VENTOLIN HFA) 108 (90 Base) MCG/ACT inhaler Inhale 2 puffs into the lungs every 4 (four) hours as needed for wheezing or shortness of breath.   aspirin 81 MG EC tablet Take 81 mg by mouth at bedtime. Swallow whole.   atorvastatin (LIPITOR) 40 MG tablet Take 1 tablet (40 mg total) by mouth at bedtime.   Calcium 200 MG TABS Take 300 mg by mouth every Monday, Tuesday, Wednesday, Thursday, and Friday.   calcium carbonate (TUMS - DOSED IN MG ELEMENTAL CALCIUM) 500 MG chewable tablet Chew 1.5 tablets (300 mg of elemental calcium total) by mouth every Monday, Tuesday, Wednesday, Thursday, and Friday.   Cholecalciferol (VITAMIN D3) 50 MCG (2000 UT) TABS Take 2,000 Units by mouth at bedtime.   ferrous sulfate 325 (65 FE) MG EC tablet Take 1 tablet (325 mg total) by mouth every other day.   fluticasone furoate-vilanterol (BREO ELLIPTA) 100-25 MCG/ACT AEPB Inhale 1 puff into the lungs daily.   melatonin 5 MG TABS Take 0.5 tablets (2.5 mg total) by mouth at bedtime. (Patient taking differently: Take 10 mg by mouth at bedtime.)   methylPREDNISolone (MEDROL DOSEPAK) 4 MG TBPK tablet 6 day taper; take as package on instructions   Multiple Vitamin (MULTIVITAMIN) capsule Take 1 capsule by mouth daily.   PARoxetine (PAXIL) 40  MG tablet Take 1 tablet (40 mg total) by mouth every morning.   senna-docusate (SENOKOT-S) 8.6-50 MG tablet Take 1 tablet by mouth at bedtime as needed for moderate constipation.   ticagrelor (BRILINTA) 90 MG TABS tablet Take 1 tablet (90 mg total) by mouth 2 (two) times daily.   tizanidine (ZANAFLEX) 2 MG capsule Take 1 capsule (2 mg total) by mouth 2 (two) times daily as needed for muscle spasms.   albuterol (PROVENTIL) (2.5 MG/3ML) 0.083% nebulizer solution Take 3 mLs (2.5 mg total) by nebulization every 4 (four) hours as needed for shortness of breath or wheezing. (Patient not taking: Reported on  06/21/2023)   busPIRone (BUSPAR) 5 MG tablet Take 1 tablet (5 mg total) by mouth 2 (two) times daily as needed (stress anxiety). (Patient not taking: Reported on 04/24/2023)   melatonin 3 MG TABS tablet Take 3 mg by mouth at bedtime. (Patient not taking: Reported on 04/24/2023)   No facility-administered medications prior to visit.    Review of Systems  Constitutional:  Negative for chills and fever.  Gastrointestinal:  Positive for abdominal pain. Negative for constipation, diarrhea, nausea and vomiting.  Skin:  Positive for rash.        Objective    BP 132/78   Pulse 87   Resp 16   Ht 5\' 6"  (1.676 m)   Wt 126 lb (57.2 kg)   SpO2 97%   BMI 20.34 kg/m     Physical Exam Vitals reviewed.  Constitutional:      General: She is awake.     Appearance: Normal appearance. She is well-developed and well-groomed.  HENT:     Head: Normocephalic and atraumatic.  Pulmonary:     Effort: Pulmonary effort is normal.  Abdominal:     General: Abdomen is flat. Bowel sounds are normal.     Palpations: Abdomen is soft.     Tenderness: There is generalized abdominal tenderness.     Hernia: A hernia is present. Hernia is present in the ventral area.    Musculoskeletal:     Cervical back: Normal range of motion.  Skin:    Findings: Rash present.     Comments: Vesiculopapular rash present along right side of abdomen with extension to middle of abdomen and wrapping around to mid back along T8/T9 dermatome   Neurological:     Mental Status: She is alert.  Psychiatric:        Attention and Perception: Attention and perception normal.        Mood and Affect: Mood normal.        Speech: Speech normal.        Behavior: Behavior normal. Behavior is cooperative.       No results found for any visits on 06/21/23.  Assessment & Plan      No follow-ups on file.       Problem List Items Addressed This Visit   None Visit Diagnoses       Herpes zoster without complication    -   Primary   Relevant Medications   acyclovir (ZOVIRAX) 800 MG tablet     Ventral hernia without obstruction or gangrene       Relevant Orders   CT ABDOMEN PELVIS WO CONTRAST   Ambulatory referral to General Surgery      Assessment and Plan    Shingles (Herpes Zoster) Acute onset of shingles with a painful, dermatomal rash on the right side. Rash is still contagious as it has not crusted over.  Discussed the need to avoid contact with others until the rash crusts over and the benefits of antiviral medication in reducing symptom duration and severity. - Prescribe Aciclovir - Advise to avoid touching the rash and wash hands immediately if contact occurs - Educate on contagious nature until rash crusts over  Hernia Symptoms of bulging and pain suggest a hernia. Discussed the need for imaging to assess hernia size and location, and potential surgical intervention by a general surgeon, contingent on shingles resolution. - Order CT scan of the abdomen - Refer to general surgery for evaluation and potential surgical intervention  Back Pain Chronic back pain exacerbated by a recent fall. Managed with Medrol Dosepak and tizanidine, which provides relief but causes dizziness. Discussed dizziness risks and safety measures. Tylenol recommended for additional pain relief due to NSAID contraindication with Brilinta. - Continue Medrol Dosepak - Continue tizanidine 2 mg per patient preference  - Advise to take Tylenol as needed for additional pain relief  Follow-up - Schedule CT scan of the abdomen - Schedule appointment with general surgery - If no contact from general surgery or imaging within two weeks, advise to call PCP office.     Reviewed previous notes and encounters from 06/11/23 and 06/18/23 along with recommended plan, imaging and lab results    No follow-ups on file.   I, Tawonna Esquer E Jonita Hirota, PA-C, have reviewed all documentation for this visit. The documentation on 06/22/23 for the exam,  diagnosis, procedures, and orders are all accurate and complete.   Jacquelin Hawking, MHS, PA-C [facility]

## 2023-06-21 NOTE — Telephone Encounter (Signed)
  Chief Complaint: abdominal pain Symptoms: had fall on 06/11/23, has back pain and has abdominal pain from using abdominal muscles, has redness and swelling from possible hernia Frequency: couple of days  Pertinent Negatives: NA Disposition: [] ED /[] Urgent Care (no appt availability in office) / [x] Appointment(In office/virtual)/ []  [facility]/ [] Home Care/ [] Refused Recommended Disposition /[] [facility]/ []  Follow-up with PCP Additional Notes: pt thinks she has a hernia from fall in abdomen since she had similar situation during pregnancy. Scheduled OV today at 1520 with Erin, PA.   Answer Assessment - Initial Assessment Questions 1. MECHANISM: "How did the injury happen?"      Had fall  2. ONSET: "When did the injury happen?" (Minutes or hours ago)    06/11/23 3. LOCATION: "What part of the abdomen is injured?"      Abdominal area  4. APPEARANCE of INJURY: "What does the injury look like?"     Redness and swelling  5. PAIN: "Is there any pain? If Yes, ask: "How bad is the pain?"  (e.g., Scale 1-10; mild, moderate, or severe)   - MILD (1-3): doesn't interfere with normal activities, abdomen soft and not tender to touch    - MODERATE (4-7): interferes with normal activities or awakens from sleep, abdomen tender to touch    - SEVERE (8-10): excruciating pain, doubled over, unable to do any normal activities      Mild to moderate  8. OTHER SYMPTOMS: "Do you have any other symptoms?"     Back pain  Protocols used: Abdominal Injury-A-AH

## 2023-06-26 ENCOUNTER — Ambulatory Visit: Payer: Medicare Other | Admitting: Surgery

## 2023-06-27 ENCOUNTER — Other Ambulatory Visit: Payer: Self-pay | Admitting: Obstetrics and Gynecology

## 2023-06-28 ENCOUNTER — Emergency Department: Payer: Medicare Other

## 2023-06-28 ENCOUNTER — Other Ambulatory Visit: Payer: Self-pay

## 2023-06-28 ENCOUNTER — Inpatient Hospital Stay
Admission: EM | Admit: 2023-06-28 | Discharge: 2023-07-03 | DRG: 378 | Disposition: A | Discharge status: Home Health | Payer: Medicare Other | Attending: Internal Medicine | Admitting: Internal Medicine

## 2023-06-28 DIAGNOSIS — Z881 Allergy status to other antibiotic agents status: Secondary | ICD-10-CM

## 2023-06-28 DIAGNOSIS — N2581 Secondary hyperparathyroidism of renal origin: Secondary | ICD-10-CM | POA: Diagnosis present

## 2023-06-28 DIAGNOSIS — Z8042 Family history of malignant neoplasm of prostate: Secondary | ICD-10-CM | POA: Diagnosis not present

## 2023-06-28 DIAGNOSIS — Z95820 Peripheral vascular angioplasty status with implants and grafts: Secondary | ICD-10-CM

## 2023-06-28 DIAGNOSIS — K640 First degree hemorrhoids: Secondary | ICD-10-CM | POA: Diagnosis not present

## 2023-06-28 DIAGNOSIS — D72829 Elevated white blood cell count, unspecified: Secondary | ICD-10-CM | POA: Diagnosis present

## 2023-06-28 DIAGNOSIS — Z87891 Personal history of nicotine dependence: Secondary | ICD-10-CM | POA: Diagnosis not present

## 2023-06-28 DIAGNOSIS — K921 Melena: Secondary | ICD-10-CM | POA: Diagnosis present

## 2023-06-28 DIAGNOSIS — K625 Hemorrhage of anus and rectum: Secondary | ICD-10-CM | POA: Diagnosis not present

## 2023-06-28 DIAGNOSIS — M549 Dorsalgia, unspecified: Secondary | ICD-10-CM | POA: Diagnosis not present

## 2023-06-28 DIAGNOSIS — Z7982 Long term (current) use of aspirin: Secondary | ICD-10-CM | POA: Diagnosis not present

## 2023-06-28 DIAGNOSIS — K5731 Diverticulosis of large intestine without perforation or abscess with bleeding: Secondary | ICD-10-CM | POA: Diagnosis not present

## 2023-06-28 DIAGNOSIS — J439 Emphysema, unspecified: Secondary | ICD-10-CM | POA: Diagnosis not present

## 2023-06-28 DIAGNOSIS — D519 Vitamin B12 deficiency anemia, unspecified: Secondary | ICD-10-CM | POA: Diagnosis not present

## 2023-06-28 DIAGNOSIS — W19XXXD Unspecified fall, subsequent encounter: Secondary | ICD-10-CM | POA: Diagnosis present

## 2023-06-28 DIAGNOSIS — Z833 Family history of diabetes mellitus: Secondary | ICD-10-CM

## 2023-06-28 DIAGNOSIS — M4854XD Collapsed vertebra, not elsewhere classified, thoracic region, subsequent encounter for fracture with routine healing: Secondary | ICD-10-CM | POA: Diagnosis present

## 2023-06-28 DIAGNOSIS — I129 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 stage 4 chronic kidney disease, or unspecified chronic kidney disease: Secondary | ICD-10-CM | POA: Diagnosis present

## 2023-06-28 DIAGNOSIS — Z85118 Personal history of other malignant neoplasm of bronchus and lung: Secondary | ICD-10-CM

## 2023-06-28 DIAGNOSIS — Z7952 Long term (current) use of systemic steroids: Secondary | ICD-10-CM

## 2023-06-28 DIAGNOSIS — Z7902 Long term (current) use of antithrombotics/antiplatelets: Secondary | ICD-10-CM | POA: Diagnosis not present

## 2023-06-28 DIAGNOSIS — R188 Other ascites: Secondary | ICD-10-CM | POA: Diagnosis not present

## 2023-06-28 DIAGNOSIS — N1832 Chronic kidney disease, stage 3b: Secondary | ICD-10-CM | POA: Diagnosis not present

## 2023-06-28 DIAGNOSIS — M810 Age-related osteoporosis without current pathological fracture: Secondary | ICD-10-CM | POA: Diagnosis present

## 2023-06-28 DIAGNOSIS — D509 Iron deficiency anemia, unspecified: Secondary | ICD-10-CM | POA: Diagnosis present

## 2023-06-28 DIAGNOSIS — E785 Hyperlipidemia, unspecified: Secondary | ICD-10-CM | POA: Diagnosis present

## 2023-06-28 DIAGNOSIS — K922 Gastrointestinal hemorrhage, unspecified: Principal | ICD-10-CM | POA: Diagnosis present

## 2023-06-28 DIAGNOSIS — B029 Zoster without complications: Secondary | ICD-10-CM | POA: Diagnosis present

## 2023-06-28 DIAGNOSIS — C3490 Malignant neoplasm of unspecified part of unspecified bronchus or lung: Secondary | ICD-10-CM | POA: Diagnosis not present

## 2023-06-28 DIAGNOSIS — F419 Anxiety disorder, unspecified: Secondary | ICD-10-CM | POA: Diagnosis present

## 2023-06-28 DIAGNOSIS — Z9181 History of falling: Secondary | ICD-10-CM

## 2023-06-28 DIAGNOSIS — Z8249 Family history of ischemic heart disease and other diseases of the circulatory system: Secondary | ICD-10-CM | POA: Diagnosis not present

## 2023-06-28 DIAGNOSIS — I7143 Infrarenal abdominal aortic aneurysm, without rupture: Secondary | ICD-10-CM | POA: Diagnosis not present

## 2023-06-28 DIAGNOSIS — J4489 Other specified chronic obstructive pulmonary disease: Secondary | ICD-10-CM | POA: Diagnosis not present

## 2023-06-28 DIAGNOSIS — K5791 Diverticulosis of intestine, part unspecified, without perforation or abscess with bleeding: Secondary | ICD-10-CM | POA: Diagnosis not present

## 2023-06-28 DIAGNOSIS — S22000A Wedge compression fracture of unspecified thoracic vertebra, initial encounter for closed fracture: Secondary | ICD-10-CM

## 2023-06-28 DIAGNOSIS — D62 Acute posthemorrhagic anemia: Secondary | ICD-10-CM | POA: Diagnosis present

## 2023-06-28 DIAGNOSIS — Z792 Long term (current) use of antibiotics: Secondary | ICD-10-CM

## 2023-06-28 DIAGNOSIS — F32A Depression, unspecified: Secondary | ICD-10-CM | POA: Diagnosis present

## 2023-06-28 DIAGNOSIS — M159 Polyosteoarthritis, unspecified: Secondary | ICD-10-CM | POA: Diagnosis present

## 2023-06-28 DIAGNOSIS — Z7951 Long term (current) use of inhaled steroids: Secondary | ICD-10-CM

## 2023-06-28 DIAGNOSIS — K635 Polyp of colon: Secondary | ICD-10-CM | POA: Diagnosis not present

## 2023-06-28 DIAGNOSIS — Z841 Family history of disorders of kidney and ureter: Secondary | ICD-10-CM

## 2023-06-28 DIAGNOSIS — K5730 Diverticulosis of large intestine without perforation or abscess without bleeding: Secondary | ICD-10-CM | POA: Diagnosis not present

## 2023-06-28 DIAGNOSIS — J449 Chronic obstructive pulmonary disease, unspecified: Secondary | ICD-10-CM | POA: Diagnosis not present

## 2023-06-28 DIAGNOSIS — Z79899 Other long term (current) drug therapy: Secondary | ICD-10-CM

## 2023-06-28 DIAGNOSIS — Z8673 Personal history of transient ischemic attack (TIA), and cerebral infarction without residual deficits: Secondary | ICD-10-CM

## 2023-06-28 DIAGNOSIS — S22080A Wedge compression fracture of T11-T12 vertebra, initial encounter for closed fracture: Secondary | ICD-10-CM | POA: Diagnosis not present

## 2023-06-28 LAB — COMPREHENSIVE METABOLIC PANEL
ALT: 16 U/L (ref 0–44)
AST: 18 U/L (ref 15–41)
Albumin: 3.6 g/dL (ref 3.5–5.0)
Alkaline Phosphatase: 117 U/L (ref 38–126)
Anion gap: 12 (ref 5–15)
BUN: 40 mg/dL — ABNORMAL HIGH (ref 8–23)
CO2: 23 mmol/L (ref 22–32)
Calcium: 9.4 mg/dL (ref 8.9–10.3)
Chloride: 103 mmol/L (ref 98–111)
Creatinine, Ser: 1.58 mg/dL — ABNORMAL HIGH (ref 0.44–1.00)
GFR, Estimated: 32 mL/min — ABNORMAL LOW (ref >60)
Glucose, Bld: 126 mg/dL — ABNORMAL HIGH (ref 70–99)
Potassium: 4.3 mmol/L (ref 3.5–5.1)
Sodium: 138 mmol/L (ref 135–145)
Total Bilirubin: 0.7 mg/dL (ref 0.0–1.2)
Total Protein: 7.7 g/dL (ref 6.5–8.1)

## 2023-06-28 LAB — CBC
HCT: 34.6 % — ABNORMAL LOW (ref 36.0–46.0)
Hemoglobin: 11.5 g/dL — ABNORMAL LOW (ref 12.0–15.0)
MCH: 33.3 pg (ref 26.0–34.0)
MCHC: 33.2 g/dL (ref 30.0–36.0)
MCV: 100.3 fL — ABNORMAL HIGH (ref 80.0–100.0)
Platelets: 283 10*3/uL (ref 150–400)
RBC: 3.45 MIL/uL — ABNORMAL LOW (ref 3.87–5.11)
RDW: 15.4 % (ref 11.5–15.5)
WBC: 12.1 10*3/uL — ABNORMAL HIGH (ref 4.0–10.5)
nRBC: 0 % (ref 0.0–0.2)

## 2023-06-28 LAB — TYPE AND SCREEN
ABO/RH(D): O POS
Antibody Screen: NEGATIVE

## 2023-06-28 MED ORDER — ACETAMINOPHEN 650 MG RE SUPP: 650.0000 mg | Freq: Four times a day (QID) | RECTAL | Status: DC | PRN

## 2023-06-28 MED ORDER — MORPHINE SULFATE (PF) 2 MG/ML IV SOLN: 2.0000 mg | INTRAVENOUS | Status: DC | PRN

## 2023-06-28 MED ORDER — MORPHINE SULFATE (PF) 2 MG/ML IV SOLN
1.0000 mg | INTRAVENOUS | Status: DC | PRN
  Administered 2023-06-30 – 2023-07-02 (×2): 1 mg via INTRAVENOUS
  Filled 2023-06-28 (×2): qty 1

## 2023-06-28 MED ORDER — SODIUM CHLORIDE 0.9% FLUSH
3.0000 mL | Freq: Two times a day (BID) | INTRAVENOUS | Status: DC
  Administered 2023-06-28 – 2023-07-03 (×11): 3 mL via INTRAVENOUS

## 2023-06-28 MED ORDER — PANTOPRAZOLE SODIUM 40 MG IV SOLR
80.0000 mg | Freq: Once | INTRAVENOUS | Status: AC
  Administered 2023-06-28: 80 mg via INTRAVENOUS
  Filled 2023-06-28: qty 20

## 2023-06-28 MED ORDER — VITAMIN B-12 1000 MCG PO TABS
1000.0000 ug | ORAL_TABLET | Freq: Every day | ORAL | Status: DC
  Administered 2023-06-28 – 2023-07-03 (×5): 1000 ug via ORAL
  Filled 2023-06-28 (×3): qty 1
  Filled 2023-06-28: qty 2
  Filled 2023-06-28: qty 1
  Filled 2023-06-28: qty 2

## 2023-06-28 MED ORDER — ONDANSETRON HCL 4 MG/2ML IJ SOLN
4.0000 mg | Freq: Four times a day (QID) | INTRAMUSCULAR | Status: DC | PRN
  Administered 2023-06-28 – 2023-07-02 (×2): 4 mg via INTRAVENOUS
  Filled 2023-06-28 (×2): qty 2

## 2023-06-28 MED ORDER — SODIUM CHLORIDE 0.9 % IV BOLUS
1000.0000 mL | Freq: Once | INTRAVENOUS | Status: AC
  Administered 2023-06-28: 1000 mL via INTRAVENOUS

## 2023-06-28 MED ORDER — PAROXETINE HCL 20 MG PO TABS
40.0000 mg | ORAL_TABLET | Freq: Every day | ORAL | Status: DC
  Administered 2023-06-28 – 2023-07-03 (×5): 40 mg via ORAL
  Filled 2023-06-28 (×6): qty 2

## 2023-06-28 MED ORDER — BUSPIRONE HCL 10 MG PO TABS
5.0000 mg | ORAL_TABLET | Freq: Two times a day (BID) | ORAL | Status: DC
  Administered 2023-06-28 – 2023-07-03 (×10): 5 mg via ORAL
  Filled 2023-06-28 (×11): qty 1

## 2023-06-28 MED ORDER — IOHEXOL 300 MG/ML  SOLN
75.0000 mL | Freq: Once | INTRAMUSCULAR | Status: AC | PRN
  Administered 2023-06-28: 75 mL via INTRAVENOUS

## 2023-06-28 MED ORDER — ACYCLOVIR 200 MG PO CAPS
800.0000 mg | ORAL_CAPSULE | Freq: Four times a day (QID) | ORAL | Status: DC
  Administered 2023-06-28 – 2023-07-03 (×17): 800 mg via ORAL
  Filled 2023-06-28 (×23): qty 4

## 2023-06-28 MED ORDER — MORPHINE SULFATE (PF) 2 MG/ML IV SOLN
2.0000 mg | Freq: Once | INTRAVENOUS | Status: AC
  Administered 2023-06-28: 2 mg via INTRAVENOUS
  Filled 2023-06-28: qty 1

## 2023-06-28 MED ORDER — PEG 3350-KCL-NA BICARB-NACL 420 G PO SOLR
4000.0000 mL | Freq: Once | ORAL | Status: AC
  Administered 2023-06-28: 4000 mL via ORAL
  Filled 2023-06-28: qty 4000

## 2023-06-28 MED ORDER — ACETAMINOPHEN 325 MG PO TABS
650.0000 mg | ORAL_TABLET | Freq: Four times a day (QID) | ORAL | Status: AC | PRN
Start: 2023-06-28 — End: ?

## 2023-06-28 MED ORDER — ONDANSETRON HCL 4 MG PO TABS: 4.0000 mg | ORAL_TABLET | Freq: Four times a day (QID) | ORAL | Status: DC | PRN

## 2023-06-28 MED ORDER — OXYCODONE HCL 5 MG PO TABS
5.0000 mg | ORAL_TABLET | ORAL | Status: DC | PRN
  Administered 2023-06-28 – 2023-07-03 (×13): 5 mg via ORAL
  Filled 2023-06-28 (×16): qty 1

## 2023-06-28 MED ORDER — BISACODYL 5 MG PO TBEC: 5.0000 mg | DELAYED_RELEASE_TABLET | Freq: Every day | ORAL | Status: DC | PRN

## 2023-06-28 MED ORDER — ATORVASTATIN CALCIUM 20 MG PO TABS
40.0000 mg | ORAL_TABLET | Freq: Every day | ORAL | Status: AC
Start: 2023-06-28 — End: ?
  Administered 2023-06-28 – 2023-07-03 (×5): 40 mg via ORAL
  Filled 2023-06-28 (×6): qty 2

## 2023-06-28 NOTE — ED Notes (Signed)
Assisted MD with rectal exam. Hemacult positive

## 2023-06-28 NOTE — ED Triage Notes (Addendum)
Pt here via [facility] with rectal bleeding that started this morning. Pt denies abd pain cut c/o lower back pain from a fall 3 weeks ago. Pt has a hx of shingles and COPD. Pt denies cp or SOB. Pt family states blood was bright red in color with some clots.

## 2023-06-28 NOTE — H&P (Signed)
History and Physical    Christine Burnett:[id number] DOB: [birth year] DOA: 06/28/2023  PCP: Danelle Berry, PA-C  Patient coming from: home    Chief Complaint: blood in stool   HPI: 83 y/o F w/ of PMH CKDIII, COPD, depression, lung cancer (in remission), carotid stenosis, IDA, TIA who presented after 1 episode of bright red blood per rectum. Pt is on aspirin, brilinta for stent placed in carotid in Nov 2024. Pt denies ever having EGD in the past and pt had colonoscopy approx 5-6 years and the scope could not be advanced for some reason as per pt. Pt denies taking NSAIDs. Pt denies any fever, chills, sweating, chest pain, vomiting, dysuria, urinary urgency, urinary frequency, diarrhea or constipation. Pt c/o intermittent nausea & abd pain. Of note, pt was recently dx w/ shingles and started on acyclovir and has not been taking this medication appropriately b/c its 4 times a day and difficult for her to take. The abd pain is secondary to shingles rash located on her abd and back.   Review of Systems: As per HPI otherwise 14 point review of systems negative.    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   Managed by nephrologist   Non-small cell lung cancer (NSCLC) (HCC)    Osteoporosis    Oxygen deficiency Me   Risk for falls    Situational disturbance    Stroke (HCC) 2011   tia's x 2   TIA (transient ischemic attack)     Past Surgical History:  Procedure Laterality Date   CAROTID PTA/STENT INTERVENTION Right  04/20/2023   Procedure: CAROTID PTA/STENT INTERVENTION;  Surgeon: Annice Needy, MD;  Location: [facility];  Service: Cardiovascular;  Laterality: Right;  Inominate Artery Stenosis requiring stenting   COLONOSCOPY  2016   ESOPHAGUS SURGERY     INTRAMEDULLARY (IM) NAIL INTERTROCHANTERIC Left 03/26/2017   Procedure: INTRAMEDULLARY (IM) NAIL INTERTROCHANTRIC;  Surgeon: Kennedy Bucker, MD;  Location: [facility];  Service: Orthopedics;  Laterality: Left;   LEFT HEART CATH AND CORONARY ANGIOGRAPHY N/A 04/15/2021   Procedure: LEFT HEART CATH AND CORONARY ANGIOGRAPHY;  Surgeon: Iran Ouch, MD;  Location: [facility];  Service: Cardiovascular;  Laterality: N/A;   RIGHT/LEFT HEART CATH AND CORONARY ANGIOGRAPHY Bilateral 05/15/2022   Procedure: RIGHT/LEFT HEART CATH AND CORONARY ANGIOGRAPHY;  Surgeon: Iran Ouch, MD;  Location: [facility];  Service: Cardiovascular;  Laterality: Bilateral;   VIDEO BRONCHOSCOPY WITH ENDOBRONCHIAL NAVIGATION Right 06/28/2020   Procedure: VIDEO BRONCHOSCOPY WITH ENDOBRONCHIAL NAVIGATION CELLVIZIO;  Surgeon: Salena Saner, MD;  Location: [facility];  Service: Pulmonary;  Laterality: Right;     reports that she quit smoking about 6 years ago. Her smoking use included cigarettes. She started smoking about 67 years ago. She has a 61 pack-year smoking history. She has never used smokeless tobacco. She reports that she does not drink alcohol and does not use drugs.  Allergies  Allergen  Reactions   Zithromax [Azithromycin]    Cefuroxime Axetil Rash   Ciprofloxacin Nausea Only    Unknown   Codeine Other (See Comments)    Patient does not like how the medication makes you feel     Family History  Problem Relation Age of Onset   Other Mother        Uremeic posioning   Kidney disease Mother    Heart attack Father    Prostate cancer Father    Cancer Father    Heart disease Father    Heart attack Brother    Diabetes Brother     Hypertension Daughter    Kidney cancer Neg Hx    Bladder Cancer Neg Hx    Colon cancer Neg Hx    Colon polyps Neg Hx    Rectal cancer Neg Hx      Prior to Admission medications   Medication Sig Start Date End Date Taking? Authorizing Provider  acyclovir (ZOVIRAX) 800 MG tablet Take 1 tablet (800 mg total) by mouth 4 (four) times daily for 10 days. 06/21/23 07/01/23  Mecum, Erin E, PA-C  albuterol (PROVENTIL) (2.5 MG/3ML) 0.083% nebulizer solution Take 3 mLs (2.5 mg total) by nebulization every 4 (four) hours as needed for shortness of breath or wheezing. Patient not taking: Reported on 06/21/2023 04/19/23   Sunnie Nielsen, DO  albuterol (VENTOLIN HFA) 108 [phone] Base) MCG/ACT inhaler Inhale 2 puffs into the lungs every 4 (four) hours as needed for wheezing or shortness of breath. 04/03/23   Mecum, Erin E, PA-C  aspirin 81 MG EC tablet Take 81 mg by mouth at bedtime. Swallow whole.    [provider]  atorvastatin (LIPITOR) 40 MG tablet Take 1 tablet (40 mg total) by mouth at bedtime. 04/03/23   Mecum, Erin E, PA-C  busPIRone (BUSPAR) 5 MG tablet Take 1 tablet (5 mg total) by mouth 2 (two) times daily as needed (stress anxiety). Patient not taking: Reported on 04/24/2023 04/03/23   Mecum, Oswaldo Conroy, PA-C  Calcium 200 MG TABS Take 300 mg by mouth every Monday, Tuesday, Wednesday, Thursday, and Friday.    [provider]  calcium carbonate (TUMS - DOSED IN MG ELEMENTAL CALCIUM) 500 MG chewable tablet Chew 1.5 tablets (300 mg of elemental calcium total) by mouth every Monday, Tuesday, Wednesday, Thursday, and Friday. 04/19/23   Sunnie Nielsen, DO  Cholecalciferol (VITAMIN D3) 50 MCG (2000 UT) TABS Take 2,000 Units by mouth at bedtime.    [provider]  ferrous sulfate 325 (65 FE) MG EC tablet Take 1 tablet (325 mg total) by mouth every other day. 04/23/23 04/22/24  Wouk, Wilfred Curtis, MD  fluticasone furoate-vilanterol (BREO ELLIPTA) 100-25 MCG/ACT AEPB Inhale 1 puff  into the lungs daily. 09/25/22   Danelle Berry, PA-C  melatonin 3 MG TABS tablet Take 3 mg by mouth at bedtime. Patient not taking: Reported on 04/24/2023    [provider]  melatonin 5 MG TABS Take 0.5 tablets (2.5 mg total) by mouth at bedtime. Patient taking differently: Take 10 mg by mouth at bedtime. 04/19/23   Sunnie Nielsen, DO  methylPREDNISolone (MEDROL DOSEPAK) 4 MG TBPK tablet 6 day taper; take as package on instructions 06/18/23   Margaretann Loveless, PA-C  Multiple Vitamin (MULTIVITAMIN) capsule Take 1 capsule by mouth daily.    [provider]  PARoxetine (PAXIL) 40 MG tablet Take 1 tablet (40 mg total) by mouth every morning. 04/03/23   Mecum, Erin E, PA-C  senna-docusate (SENOKOT-S) 8.6-50  MG tablet Take 1 tablet by mouth at bedtime as needed for moderate constipation. 04/23/23   Wouk, Wilfred Curtis, MD  ticagrelor (BRILINTA) 90 MG TABS tablet Take 1 tablet (90 mg total) by mouth 2 (two) times daily. 04/23/23   Wouk, Wilfred Curtis, MD  tizanidine (ZANAFLEX) 2 MG capsule Take 1 capsule (2 mg total) by mouth 2 (two) times daily as needed for muscle spasms. 06/18/23   Margaretann Loveless, PA-C    Physical Exam: Vitals:   06/28/23 1230 06/28/23 1300 06/28/23 1330 06/28/23 1430  BP: (!) 209/109 (!) 209/104 (!) 204/102   Pulse: (!) 102 (!) 107 (!) 101 85  Resp: 15 20 19 20   Temp:      TempSrc:      SpO2: 100% 100% 100% 99%  Weight:      Height:        Constitutional: NAD, calm, comfortable Vitals:   06/28/23 1230 06/28/23 1300 06/28/23 1330 06/28/23 1430  BP: (!) 209/109 (!) 209/104 (!) 204/102   Pulse: (!) 102 (!) 107 (!) 101 85  Resp: 15 20 19 20   Temp:      TempSrc:      SpO2: 100% 100% 100% 99%  Weight:      Height:       Eyes: PERRL, lids and conjunctivae normal ENMT: Mucous membranes are moist.  Neck: normal, supple Respiratory: clear to auscultation bilaterally, no wheezing, no crackles. Normal respiratory effort. No accessory muscle use.   Cardiovascular: Regular rate and rhythm, no murmurs / rubs / gallops.  Abdomen: soft, tenderness to palpation, Bowel sounds positive.  Musculoskeletal: no clubbing / cyanosis. Moves all extremities  Skin: rash on abd and back that have small erythematous vesicles  Neurologic: CN 2-12 grossly intact.moves all extremities.  Psychiatric: Normal judgment and insight. Alert and oriented x 3. Normal mood.   Labs on Admission: I have personally reviewed following labs and imaging studies  CBC: Recent Labs  Lab 06/28/23 1114  WBC 12.1*  HGB 11.5*  HCT 34.6*  MCV 100.3*  PLT 283   Basic Metabolic Panel: Recent Labs  Lab 06/28/23 1114  NA 138  K 4.3  CL 103  CO2 23  GLUCOSE 126*  BUN 40*  CREATININE 1.58*  CALCIUM 9.4   GFR: Estimated Creatinine Clearance: 24.4 mL/min (A) (by C-G formula based on SCr of 1.58 mg/dL (H)). Liver Function Tests: Recent Labs  Lab 06/28/23 1114  AST 18  ALT 16  ALKPHOS 117  BILITOT 0.7  PROT 7.7  ALBUMIN 3.6   No results for input(s): "LIPASE", "AMYLASE" in the last 168 hours. No results for input(s): "AMMONIA" in the last 168 hours. Coagulation Profile: No results for input(s): "INR", "PROTIME" in the last 168 hours. Cardiac Enzymes: No results for input(s): "CKTOTAL", "CKMB", "CKMBINDEX", "TROPONINI" in the last 168 hours. BNP (last 3 results) No results for input(s): "PROBNP" in the last 8760 hours. HbA1C: No results for input(s): "HGBA1C" in the last 72 hours. CBG: No results for input(s): "GLUCAP" in the last 168 hours. Lipid Profile: No results for input(s): "CHOL", "HDL", "LDLCALC", "TRIG", "CHOLHDL", "LDLDIRECT" in the last 72 hours. Thyroid Function Tests: No results for input(s): "TSH", "T4TOTAL", "FREET4", "T3FREE", "THYROIDAB" in the last 72 hours. Anemia Panel: No results for input(s): "VITAMINB12", "FOLATE", "FERRITIN", "TIBC", "IRON", "RETICCTPCT" in the last 72 hours. Urine analysis:    Component Value Date/Time    COLORURINE YELLOW (A) 04/17/2023 2133   APPEARANCEUR CLEAR (A) 04/17/2023 2133   APPEARANCEUR Clear 01/28/2016  0841   LABSPEC 1.013 04/17/2023 2133   PHURINE 6.0 04/17/2023 2133   GLUCOSEU NEGATIVE 04/17/2023 2133   HGBUR NEGATIVE 04/17/2023 2133   BILIRUBINUR NEGATIVE 04/17/2023 2133   BILIRUBINUR neg 07/29/2019 1541   BILIRUBINUR Negative 01/28/2016 0841   KETONESUR NEGATIVE 04/17/2023 2133   PROTEINUR NEGATIVE 04/17/2023 2133   UROBILINOGEN 0.2 07/29/2019 1541   NITRITE NEGATIVE 04/17/2023 2133   LEUKOCYTESUR NEGATIVE 04/17/2023 2133    Radiological Exams on Admission: CT L-SPINE NO CHARGE Result Date: 06/28/2023 CLINICAL DATA:  Rectal bleeding. Back pain after falling 3 weeks ago. On blood thinners. EXAM: CT Lumbar Spine without contrast TECHNIQUE: Technique: Multiplanar CT images of the lumbar spine were reconstructed from contemporary CT of the Abdomen and Pelvis. RADIATION DOSE REDUCTION: This exam was performed according to the departmental dose-optimization program which includes automated exposure control, adjustment of the mA and/or kV according to patient size and/or use of iterative reconstruction technique. CONTRAST:  No additional COMPARISON:  CT of the chest, abdomen and pelvis 04/22/2023. Lumbar spine CT 10/31/2022. FINDINGS: Segmentation: There are 5 lumbar type vertebral bodies. Alignment: Normal. Vertebrae: Chronic superior endplate compression fracture at L1 is unchanged. There is a mild superior endplate compression deformity at T11 which is new from 04/22/2023 and appears acute. This is associated with 40% loss of vertebral body height and 3 mm of osseous retropulsion. No other acute fractures are identified. Fracture of the right 10th rib adjacent to the costovertebral junction is not acute. Paraspinal and other soft tissues: No acute paraspinal findings. Intra-abdominal findings dictated separately. Disc levels: Chronic spondylosis, greatest at L4-5 and L5-S1. No large  disc herniation or high-grade spinal stenosis. There is mild left-sided foraminal narrowing at L4-5 and mild biforaminal narrowing at L5-S1 which appears chronic. IMPRESSION: 1. Acute superior endplate compression fracture at T11 with 40% loss of vertebral body height and 3 mm of osseous retropulsion. 2. No acute lumbar spine findings demonstrated. Chronic superior endplate compression fracture at L1. 3. Chronic spondylosis without high-grade spinal stenosis. 4. Abdominopelvic findings dictated separately. Electronically Signed   By: Carey Bullocks M.D.   On: 06/28/2023 13:37   CT ABDOMEN PELVIS W CONTRAST Result Date: 06/28/2023 CLINICAL DATA:  Abdominal pain, acute, nonlocalized. Rectal bleeding. History of lung cancer. Fall 3 weeks ago with back pain. EXAM: CT ABDOMEN AND PELVIS WITH CONTRAST TECHNIQUE: Multidetector CT imaging of the abdomen and pelvis was performed using the standard protocol following bolus administration of intravenous contrast. RADIATION DOSE REDUCTION: This exam was performed according to the departmental dose-optimization program which includes automated exposure control, adjustment of the mA and/or kV according to patient size and/or use of iterative reconstruction technique. CONTRAST:  75mL OMNIPAQUE IOHEXOL 300 MG/ML  SOLN COMPARISON:  04/22/2023 FINDINGS: Lower chest: Again noted is chronic consolidation in the right lower lobe. Consolidation at the right lung base may have increased compared to the previous chest CT. No acute abnormality at the left lung base. Hepatobiliary: Normal appearance of the liver, gallbladder and portal venous system. No significant biliary dilatation. Pancreas: Unremarkable. No pancreatic ductal dilatation or surrounding inflammatory changes. Spleen: Small subcapsular fluid collection around the superolateral aspect of the spleen. This small collection measures up to 7 mm in thickness. This small subcapsular fluid collection appears to be new since  November 2024. Hounsfield units of the subcapsular fluid is approximately 38. No significant inflammation around the spleen. No significant parenchymal defect or infarct. Adrenals/Urinary Tract: Normal appearance of the adrenal glands. Cortical scarring in both kidneys without hydronephrosis.  Bilateral renal calcifications could represent nonobstructive stones versus vascular calcifications. Small hypodensity in left kidney upper pole is too small to definitively characterize. No suspicious renal lesions. Normal appearance of the urinary bladder. Stomach/Bowel: High-density material in the proximal sigmoid colon on image 54/to is indeterminate. This could represent GI bleeding but limited evaluation without non contrast images. Colonic diverticula scattered throughout the colon. No acute colonic inflammation. Normal appendix. No bowel dilatation or obstruction. Normal appearance of the stomach. Probable duodenal diverticulum near the ampulla. Vascular/Lymphatic: Extensive atherosclerotic disease in the abdominal aorta with infrarenal abdominal aortic aneurysm measures up to 3.7 cm. Extensive atherosclerotic disease involving bilateral iliac arteries. Concern for areas of stenosis involving the right common and right external iliac artery. Proximal femoral arteries are patent. Calcified plaque and stenosis in left common femoral artery. Atherosclerotic plaque involving the visceral arteries but the celiac artery and SMA are patent. IMA is probably patent. Limited evaluation of the renal arteries. No significant lymph node enlargement in the abdomen or pelvis. Reproductive: Uterus and bilateral adnexa are unremarkable. Other: No free fluid. Negative for free air. Probable small left inguinal hernia containing fat. Negative for free air. Musculoskeletal: Old bilateral pubic rami fractures. Dynamic hip screw in the left hip. Joint space narrowing degenerative changes in the right hip. Old vertebral body compression  fracture at L1. There is a new compression fracture involving the superior endplate of T11. Chronic disc and endplate changes in lower lumbar spine. IMPRESSION: 1. High-density material in the proximal sigmoid colon. This could represent active GI bleeding but limited evaluation without non contrast comparison imaging. 2. New vertebral body compression fracture involving the superior endplate of T11. 3. Colonic diverticulosis without acute diverticulitis. 4. Small subcapsular splenic collection. The subcapsular fluid is slightly dense and probably represents a small subcapsular hematoma. No acute inflammatory changes involving the spleen and this could be a subacute hematoma. No significant splenic infarct. 5. Infrarenal abdominal aortic aneurysm measures up to 3.7 cm. Recommend follow-up ultrasound every 3 years. (Ref.: J Vasc Surg. 2018; 67:2-77 and J Am Coll Radiol 2013;10(10):789-794.) 6. Chronic consolidation in the right lower lobe. Consolidation at the right lung base may have slightly increased compared to the previous chest CT. 7. Aortic Atherosclerosis (ICD10-I70.0). These results were called by telephone at the time of interpretation on 06/28/2023 at 1:21 pm to provider NEHA RAY , who verbally acknowledged these results. Electronically Signed   By: Richarda Overlie M.D.   On: 06/28/2023 13:31    EKG: Independently reviewed.   Assessment/Plan Active Problems:   * No active hospital problems. *  Possible GI bleed: bright red blood as per pt. Ddx diverticular vs hemorrhoids vs lower GI bleeding. NPO excepts sips with meds until GI sees pt. Holding home dose of aspirin, brilinta. Will monitor H&H   Shingles: active. Continue on acyclovir. 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 Holding home dose of aspirin, brilinta  HLD: continue on statin  Depression: severity unknown. Continue on home dose of paxil, buspar  Macrocytic anemia: B12 was 410 05/2023. Will start  B12 supplement. Folate was WNL in 05/2023. 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 Avoid nephrotoxic meds  Leukocytosis: etiology unclear. No signs or symptoms of infection currently. Will continue to monitor  T11 compression fracture: likely secondary to fall at home several weeks ago. PT/OT consulted   Small subcapsular hematoma: incidental finding on CT abd/pelvis. Continue w/ supportive care  Hx of  lung cancer: in remission  DVT prophylaxis: SCDs Code Status: full  Family Communication: discussed pt's care w/ pt's family at bedside and answered their questions Disposition Plan: depends on PT/OT recs Consults called: GI, Dr. Servando Snare  Admission status: inpatient    Charise Killian MD [facility]   If 7PM-7AM, please contact night-coverage [email or web address]   06/28/2023, 2:40 PM

## 2023-06-28 NOTE — Consult Note (Signed)
Midge Minium, MD [facility]  [address], [address] [place], [place] [address] Phone: [phone] Fax : [phone]  Consultation  Referring Provider:     Dr. Mayford Knife Primary Care Physician:  Danelle Berry, PA-C Primary Gastroenterologist: [facility]         Reason for Consultation:     Rectal bleeding  Date of Admission:  06/28/2023 Date of Consultation:  06/28/2023         HPI:   Christine Burnett is a 83 y.o. female who has a history of diverticulosis in the past and had a single episode of a large amount of bright red blood per rectum.  The patient denies ever having this in the past.  The patient also denies having any recent colonoscopies.  The patient had been seen back in November 2024 by [facility] GI for possible GI bleed due to anemia.  At that time it was recommended not to proceed with any GI procedures.  The patient also has been diagnosed on this admission with shingles.  The patient reports that she has been on Brilinta after having a carotid stent placement.  The patient is accompanied today by her daughter who contributes to parts of her history.  The patient's blood counts have shown:  Component     Latest Ref Rng 05/24/2023 06/11/2023 06/28/2023  Hemoglobin     12.0 - 15.0 g/dL 13.0 (L)  86.5 (L)  78.4 (L)   HCT     36.0 - 46.0 % 34.7 (L)  33.7 (L)  34.6 (L)    The patient had a CT scan of the abdomen pelvis today that showed :  IMPRESSION: 1. High-density material in the proximal sigmoid colon. This could represent active GI bleeding but limited evaluation without non contrast comparison imaging. 2. New vertebral body compression fracture involving the superior endplate of T11. 3. Colonic diverticulosis without acute diverticulitis. 4. Small subcapsular splenic collection. The subcapsular fluid is slightly dense and probably represents a small subcapsular hematoma. No acute inflammatory changes involving the spleen and this could be a subacute hematoma. No  significant splenic infarct. 5. Infrarenal abdominal aortic aneurysm measures up to 3.7 cm. Recommend follow-up ultrasound every 3 years. (Ref.: J Vasc Surg. 2018; 67:2-77 and J Am Coll Radiol 2013;10(10):789-794.) 6. Chronic consolidation in the right lower lobe. Consolidation at the right lung base may have slightly increased compared to the previous chest CT. 7. Aortic Atherosclerosis  I am now being asked to see the patient for the GI bleeding.  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   Managed by nephrologist   Non-small cell lung cancer (NSCLC) (HCC)    Osteoporosis    Oxygen deficiency Me   Risk for falls    Situational disturbance    Stroke (HCC) 2011   tia's x 2   TIA (transient ischemic attack)     Past Surgical History:  Procedure Laterality Date  CAROTID PTA/STENT INTERVENTION Right 04/20/2023   Procedure: CAROTID PTA/STENT INTERVENTION;  Surgeon: Annice Needy, MD;  Location: [facility] INVASIVE CV LAB;  Service: Cardiovascular;  Laterality: Right;  Inominate Artery Stenosis requiring stenting   COLONOSCOPY  2016   ESOPHAGUS SURGERY     INTRAMEDULLARY (IM) NAIL INTERTROCHANTERIC Left 03/26/2017   Procedure: INTRAMEDULLARY (IM) NAIL INTERTROCHANTRIC;  Surgeon: Kennedy Bucker, MD;  Location: [facility] ORS;  Service: Orthopedics;  Laterality: Left;   LEFT HEART CATH AND CORONARY ANGIOGRAPHY N/A 04/15/2021   Procedure: LEFT HEART CATH AND CORONARY ANGIOGRAPHY;  Surgeon: Iran Ouch, MD;  Location: [facility]  INVASIVE CV LAB;  Service: Cardiovascular;  Laterality: N/A;   RIGHT/LEFT HEART CATH AND CORONARY ANGIOGRAPHY Bilateral 05/15/2022   Procedure: RIGHT/LEFT HEART CATH AND CORONARY ANGIOGRAPHY;  Surgeon: Iran Ouch, MD;  Location: [facility] INVASIVE CV LAB;  Service: Cardiovascular;  Laterality: Bilateral;   VIDEO BRONCHOSCOPY WITH ENDOBRONCHIAL NAVIGATION Right 06/28/2020   Procedure: VIDEO BRONCHOSCOPY WITH ENDOBRONCHIAL NAVIGATION CELLVIZIO;  Surgeon: Salena Saner, MD;  Location: [facility] ORS;  Service: Pulmonary;  Laterality: Right;    Prior to Admission medications   Medication Sig Start Date End Date Taking? Authorizing Provider  acyclovir (ZOVIRAX) 800 MG tablet Take 1 tablet (800 mg total) by mouth 4 (four) times daily for 10 days. 06/21/23 07/01/23  Mecum, Erin E, PA-C  albuterol (PROVENTIL) (2.5 MG/3ML) 0.083% nebulizer solution Take 3 mLs (2.5 mg total) by nebulization every 4 (four) hours as needed for shortness of breath or wheezing. Patient not taking: Reported on 06/21/2023 04/19/23   Sunnie Nielsen, DO  albuterol (VENTOLIN HFA) 108 [phone] Base) MCG/ACT inhaler Inhale 2 puffs into the lungs every 4 (four) hours as needed for wheezing or shortness of breath. 04/03/23   Mecum, Erin E, PA-C  aspirin 81 MG EC tablet Take 81 mg by mouth at bedtime. Swallow whole.    [provider]  atorvastatin (LIPITOR) 40 MG tablet Take 1 tablet (40 mg total) by mouth at bedtime. 04/03/23   Mecum, Erin E, PA-C  busPIRone (BUSPAR) 5 MG tablet Take 1 tablet (5 mg total) by mouth 2 (two) times daily as needed (stress anxiety). Patient not taking: Reported on 04/24/2023 04/03/23   Mecum, Oswaldo Conroy, PA-C  Calcium 200 MG TABS Take 300 mg by mouth every Monday, Tuesday, Wednesday, Thursday, and Friday.    [provider]  calcium carbonate (TUMS - DOSED IN MG ELEMENTAL CALCIUM) 500 MG chewable tablet Chew 1.5 tablets (300 mg of elemental calcium total) by mouth every Monday, Tuesday, Wednesday,  Thursday, and Friday. 04/19/23   Sunnie Nielsen, DO  Cholecalciferol (VITAMIN D3) 50 MCG (2000 UT) TABS Take 2,000 Units by mouth at bedtime.    [provider]  ferrous sulfate 325 (65 FE) MG EC tablet Take 1 tablet (325 mg total) by mouth every other day. 04/23/23 04/22/24  Wouk, Wilfred Curtis, MD  fluticasone furoate-vilanterol (BREO ELLIPTA) 100-25 MCG/ACT AEPB Inhale 1 puff into the lungs daily. 09/25/22   Danelle Berry, PA-C  melatonin 3 MG TABS tablet Take 3 mg by mouth at bedtime. Patient not taking: Reported on 04/24/2023    [provider]  melatonin 5 MG TABS Take 0.5 tablets (2.5 mg total) by mouth at bedtime. Patient taking differently: Take 10 mg by mouth at bedtime. 04/19/23   Sunnie Nielsen, DO  methylPREDNISolone (MEDROL DOSEPAK) 4 MG TBPK tablet 6 day taper; take as package on instructions 06/18/23   Margaretann Loveless, PA-C  Multiple Vitamin (MULTIVITAMIN) capsule  Take 1 capsule by mouth daily.    [provider]  PARoxetine (PAXIL) 40 MG tablet Take 1 tablet (40 mg total) by mouth every morning. 04/03/23   Mecum, Erin E, PA-C  senna-docusate (SENOKOT-S) 8.6-50 MG tablet Take 1 tablet by mouth at bedtime as needed for moderate constipation. 04/23/23   Wouk, Wilfred Curtis, MD  ticagrelor (BRILINTA) 90 MG TABS tablet Take 1 tablet (90 mg total) by mouth 2 (two) times daily. 04/23/23   Wouk, Wilfred Curtis, MD  tizanidine (ZANAFLEX) 2 MG capsule Take 1 capsule (2 mg total) by mouth 2 (two) times daily as needed for muscle spasms. 06/18/23   Margaretann Loveless, PA-C    Family History  Problem Relation Age of Onset   Other Mother        [place] posioning   Kidney disease Mother    Heart attack Father    Prostate cancer Father    Cancer Father    Heart disease Father    Heart attack Brother    Diabetes Brother    Hypertension Daughter    Kidney cancer Neg Hx    Bladder Cancer Neg Hx    Colon cancer Neg Hx    Colon polyps Neg Hx    Rectal  cancer Neg Hx      Social History   Tobacco Use   Smoking status: Former    Current packs/day: 0.00    Average packs/day: 1 pack/day for 61.0 years (61.0 ttl pk-yrs)    Types: Cigarettes    Start date: 03/25/1956    Quit date: 03/25/2017    Years since quitting: 6.2   Smokeless tobacco: Never  Vaping Use   Vaping status: Never Used  Substance Use Topics   Alcohol use: No   Drug use: No    Allergies as of 06/28/2023 - Review Complete 06/28/2023  Allergen Reaction Noted   Zithromax [azithromycin]  09/24/2021   Cefuroxime axetil Rash 09/23/2014   Ciprofloxacin Nausea Only 09/23/2014   Codeine Other (See Comments) 09/23/2014    Review of Systems:    All systems reviewed and negative except where noted in HPI.   Physical Exam:  Vital signs in last 24 hours: Temp:  [97.5 F (36.4 C)] 97.5 F (36.4 C) (01/23 1110) Pulse Rate:  [85-116] 106 (01/23 1600) Resp:  [12-20] 18 (01/23 1600) BP: (156-209)/(87-109) 167/98 (01/23 1600) SpO2:  [97 %-100 %] 100 % (01/23 1600) Weight:  [57.2 kg] 57.2 kg (01/23 1110)   General:   Pleasant, cooperative in NAD Head:  Normocephalic and atraumatic. Eyes:   No icterus.   Conjunctiva pink. PERRLA. Ears:  Normal auditory acuity. Neck:  Supple; no masses or thyroidomegaly Lungs: Respirations even and unlabored. Lungs clear to auscultation bilaterally.   No wheezes, crackles, or rhonchi.  Heart:  Regular rate and rhythm;  Without murmur, clicks, rubs or gallops Abdomen:  Soft, nondistended, nontender. Normal bowel sounds. No appreciable masses or hepatomegaly.  No rebound or guarding.  Rectal:  Not performed. Msk:  Symmetrical without gross deformities.    Extremities:  Without edema, cyanosis or clubbing. Neurologic:  Alert and oriented x3;  grossly normal neurologically. Skin:  Intact with an abdominal wall rash consistent with shingles Cervical Nodes:  No significant cervical adenopathy. Psych:  Alert and cooperative. Normal  affect.  LAB RESULTS: Recent Labs    06/28/23 1114  WBC 12.1*  HGB 11.5*  HCT 34.6*  PLT 283   BMET Recent Labs    06/28/23 1114  NA  138  K 4.3  CL 103  CO2 23  GLUCOSE 126*  BUN 40*  CREATININE 1.58*  CALCIUM 9.4   LFT Recent Labs    06/28/23 1114  PROT 7.7  ALBUMIN 3.6  AST 18  ALT 16  ALKPHOS 117  BILITOT 0.7   PT/INR No results for input(s): "LABPROT", "INR" in the last 72 hours.  STUDIES: CT L-SPINE NO CHARGE Result Date: 06/28/2023 CLINICAL DATA:  Rectal bleeding. Back pain after falling 3 weeks ago. On blood thinners. EXAM: CT Lumbar Spine without contrast TECHNIQUE: Technique: Multiplanar CT images of the lumbar spine were reconstructed from contemporary CT of the Abdomen and Pelvis. RADIATION DOSE REDUCTION: This exam was performed according to the departmental dose-optimization program which includes automated exposure control, adjustment of the mA and/or kV according to patient size and/or use of iterative reconstruction technique. CONTRAST:  No additional COMPARISON:  CT of the chest, abdomen and pelvis 04/22/2023. Lumbar spine CT 10/31/2022. FINDINGS: Segmentation: There are 5 lumbar type vertebral bodies. Alignment: Normal. Vertebrae: Chronic superior endplate compression fracture at L1 is unchanged. There is a mild superior endplate compression deformity at T11 which is new from 04/22/2023 and appears acute. This is associated with 40% loss of vertebral body height and 3 mm of osseous retropulsion. No other acute fractures are identified. Fracture of the right 10th rib adjacent to the costovertebral junction is not acute. Paraspinal and other soft tissues: No acute paraspinal findings. Intra-abdominal findings dictated separately. Disc levels: Chronic spondylosis, greatest at L4-5 and L5-S1. No large disc herniation or high-grade spinal stenosis. There is mild left-sided foraminal narrowing at L4-5 and mild biforaminal narrowing at L5-S1 which appears chronic.  IMPRESSION: 1. Acute superior endplate compression fracture at T11 with 40% loss of vertebral body height and 3 mm of osseous retropulsion. 2. No acute lumbar spine findings demonstrated. Chronic superior endplate compression fracture at L1. 3. Chronic spondylosis without high-grade spinal stenosis. 4. Abdominopelvic findings dictated separately. Electronically Signed   By: Carey Bullocks M.D.   On: 06/28/2023 13:37   CT ABDOMEN PELVIS W CONTRAST Result Date: 06/28/2023 CLINICAL DATA:  Abdominal pain, acute, nonlocalized. Rectal bleeding. History of lung cancer. Fall 3 weeks ago with back pain. EXAM: CT ABDOMEN AND PELVIS WITH CONTRAST TECHNIQUE: Multidetector CT imaging of the abdomen and pelvis was performed using the standard protocol following bolus administration of intravenous contrast. RADIATION DOSE REDUCTION: This exam was performed according to the departmental dose-optimization program which includes automated exposure control, adjustment of the mA and/or kV according to patient size and/or use of iterative reconstruction technique. CONTRAST:  75mL OMNIPAQUE IOHEXOL 300 MG/ML  SOLN COMPARISON:  04/22/2023 FINDINGS: Lower chest: Again noted is chronic consolidation in the right lower lobe. Consolidation at the right lung base may have increased compared to the previous chest CT. No acute abnormality at the left lung base. Hepatobiliary: Normal appearance of the liver, gallbladder and portal venous system. No significant biliary dilatation. Pancreas: Unremarkable. No pancreatic ductal dilatation or surrounding inflammatory changes. Spleen: Small subcapsular fluid collection around the superolateral aspect of the spleen. This small collection measures up to 7 mm in thickness. This small subcapsular fluid collection appears to be new since November 2024. Hounsfield units of the subcapsular fluid is approximately 38. No significant inflammation around the spleen. No significant parenchymal defect or  infarct. Adrenals/Urinary Tract: Normal appearance of the adrenal glands. Cortical scarring in both kidneys without hydronephrosis. Bilateral renal calcifications could represent nonobstructive stones versus vascular calcifications. Small hypodensity in left  kidney upper pole is too small to definitively characterize. No suspicious renal lesions. Normal appearance of the urinary bladder. Stomach/Bowel: High-density material in the proximal sigmoid colon on image 54/to is indeterminate. This could represent GI bleeding but limited evaluation without non contrast images. Colonic diverticula scattered throughout the colon. No acute colonic inflammation. Normal appendix. No bowel dilatation or obstruction. Normal appearance of the stomach. Probable duodenal diverticulum near the ampulla. Vascular/Lymphatic: Extensive atherosclerotic disease in the abdominal aorta with infrarenal abdominal aortic aneurysm measures up to 3.7 cm. Extensive atherosclerotic disease involving bilateral iliac arteries. Concern for areas of stenosis involving the right common and right external iliac artery. Proximal femoral arteries are patent. Calcified plaque and stenosis in left common femoral artery. Atherosclerotic plaque involving the visceral arteries but the celiac artery and SMA are patent. IMA is probably patent. Limited evaluation of the renal arteries. No significant lymph node enlargement in the abdomen or pelvis. Reproductive: Uterus and bilateral adnexa are unremarkable. Other: No free fluid. Negative for free air. Probable small left inguinal hernia containing fat. Negative for free air. Musculoskeletal: Old bilateral pubic rami fractures. Dynamic hip screw in the left hip. Joint space narrowing degenerative changes in the right hip. Old vertebral body compression fracture at L1. There is a new compression fracture involving the superior endplate of T11. Chronic disc and endplate changes in lower lumbar spine. IMPRESSION: 1.  High-density material in the proximal sigmoid colon. This could represent active GI bleeding but limited evaluation without non contrast comparison imaging. 2. New vertebral body compression fracture involving the superior endplate of T11. 3. Colonic diverticulosis without acute diverticulitis. 4. Small subcapsular splenic collection. The subcapsular fluid is slightly dense and probably represents a small subcapsular hematoma. No acute inflammatory changes involving the spleen and this could be a subacute hematoma. No significant splenic infarct. 5. Infrarenal abdominal aortic aneurysm measures up to 3.7 cm. Recommend follow-up ultrasound every 3 years. (Ref.: J Vasc Surg. 2018; 67:2-77 and J Am Coll Radiol 2013;10(10):789-794.) 6. Chronic consolidation in the right lower lobe. Consolidation at the right lung base may have slightly increased compared to the previous chest CT. 7. Aortic Atherosclerosis (ICD10-I70.0). These results were called by telephone at the time of interpretation on 06/28/2023 at 1:21 pm to provider NEHA RAY , who verbally acknowledged these results. Electronically Signed   By: Richarda Overlie M.D.   On: 06/28/2023 13:31      Impression / Plan:   Assessment: Principal Problem:   GI bleed   MCKAY NOTZ is a 83 y.o. y/o female with rectal bleeding x 1 with a large amount of bright red blood per rectum.  The patient's hemoglobin is stable.  I been consulted to see the patient for the GI bleeding.  Patient had imaging that showed extensive diverticulosis.  The patient has not had a colonoscopy in many years.  Plan:  The patient will be prepped today for colonoscopy and will be started on a clear liquid diet.  She stopped taking her Brilinta the day prior to her admission.  The patient has been ordered a prep and will drink that tonight and will be set up for a colonoscopy for tomorrow.  PPI IV twice daily  Continue serial CBCs and transfuse PRN Avoid NSAIDs Maintain 2 large-bore  IV lines Please page GI with any acute hemodynamic changes, or signs of active GI bleeding   Thank you for involving me in the care of this patient.      LOS: 0 days  Midge Minium, MD, MD. Clementeen Graham 06/28/2023, 4:47 PM,  Pager [phone] 7am-5pm  Check AMION for 5pm -7am coverage and on weekends   Note: This dictation was prepared with Dragon dictation along with smaller phrase technology. Any transcriptional errors that result from this process are unintentional.

## 2023-06-28 NOTE — ED Provider Notes (Signed)
[facility] Provider Note    Event Date/Time   First MD Initiated Contact with Patient 06/28/23 1114     (approximate)   History   Rectal Bleeding   HPI  Christine Burnett is a 83 year old female with history of HTN, COPD presenting to the emergency department for evaluation of rectal bleeding.  Around 4 AM this morning, patient had episode of bright red bleeding in her rectum.  She is on ASA and Brilinta.  No abdominal pain.  Did have a fall 3 weeks ago and has had some ongoing back pain, though she also was recently diagnosed with shingles.  No chest pain or shortness of breath.  No syncope.     Physical Exam   Triage Vital Signs: ED Triage Vitals [06/28/23 1110]  Encounter Vitals Group     BP (!) 159/96     Systolic BP Percentile      Diastolic BP Percentile      Pulse Rate (!) 108     Resp 17     Temp (!) 97.5 F (36.4 C)     Temp Source Oral     SpO2 97 %     Weight 126 lb 1.7 oz (57.2 kg)     Height 5\' 6"  (1.676 m)     Head Circumference      Peak Flow      Pain Score 10     Pain Loc      Pain Education      Exclude from Growth Chart     Most recent vital signs: Vitals:   06/28/23 1330 06/28/23 1430  BP: (!) 204/102   Pulse: (!) 101 85  Resp: 19 20  Temp:    SpO2: 100% 99%     General: Awake, interactive  CV:  Tachycardic, good peripheral perfusion Resp:  Unlabored respirations, lungs clear to auscultation Abd:  Nondistended, soft, no to palpation, bright red blood on rectal exam, Hemoccult positive Neuro:  Symmetric facial movement, fluid speech   ED Results / Procedures / Treatments   Labs (all labs ordered are listed, but only abnormal results are displayed) Labs Reviewed  COMPREHENSIVE METABOLIC PANEL - Abnormal; Notable for the following components:      Result Value   Glucose, Bld 126 (*)    BUN 40 (*)    Creatinine, Ser 1.58 (*)    GFR, Estimated 32 (*)    All other components within normal limits  CBC -  Abnormal; Notable for the following components:   WBC 12.1 (*)    RBC 3.45 (*)    Hemoglobin 11.5 (*)    HCT 34.6 (*)    MCV 100.3 (*)    All other components within normal limits  POC OCCULT BLOOD, ED  TYPE AND SCREEN     EKG EKG independently reviewed interpreted by myself (ER attending) demonstrates:    RADIOLOGY Imaging independently reviewed and interpreted by myself demonstrates:  CT abdomen pelvis demonstrates possible area of bleeding in the sigmoid colon as well as a T11 vertebral body compression fracture.  Radiology also notes small subcapsular splenic collection possibly due to subacute hematoma, no hemoperitoneum.  I did receive a call from radiology team regarding patient findings.  PROCEDURES:  Critical Care performed: Yes, see critical care procedure note(s)  CRITICAL CARE Performed by: Trinna Post   Total critical care time: 32 minutes  Critical care time was exclusive of separately billable procedures and treating other patients.  Critical care was  necessary to treat or prevent imminent or life-threatening deterioration.  Critical care was time spent personally by me on the following activities: development of treatment plan with patient and/or surrogate as well as nursing, discussions with consultants, evaluation of patient's response to treatment, examination of patient, obtaining history from patient or surrogate, ordering and performing treatments and interventions, ordering and review of laboratory studies, ordering and review of radiographic studies, pulse oximetry and re-evaluation of patient's condition.   Procedures   MEDICATIONS ORDERED IN ED: Medications  pantoprazole (PROTONIX) injection 80 mg (has no administration in time range)  sodium chloride 0.9 % bolus 1,000 mL (1,000 mLs Intravenous New Bag/Given 06/28/23 1203)  iohexol (OMNIPAQUE) 300 MG/ML solution 75 mL (75 mLs Intravenous Contrast Given 06/28/23 1213)  morphine (PF) 2 MG/ML injection 2  mg (2 mg Intravenous Given 06/28/23 1400)     IMPRESSION / MDM / ASSESSMENT AND PLAN / ED COURSE  I reviewed the triage vital signs and the nursing notes.  Differential diagnosis includes, but is not limited to, diverticular bleed, AVM, other lower GI bleeding source, consideration for upper GI bleed though clinical history less suggestive of this  Patient's presentation is most consistent with acute presentation with potential threat to life or bodily function.  83 year old female presenting to the emergency department for evaluation of GI bleeding.  Mild tachycardia on presentation.  Labs with stable anemia with hemoglobin of 11.5, but does have department blood on rectal exam.  Mild leukocytosis but clear infectious source.  Fortunately without further episodes of hematochezia here.  CT abdomen pelvis with questionable area of bleeding in the colon, reviewed with radiologist.  As the patient has not had any further episodes of active bleeding since this morning, he did not recommend a dedicated CT angio bleed protocol at this time.  CT imaging also notable for T11 compression fracture and possible subcapsular hematoma.  I suspect that these are likely related to patient's fall a few weeks ago.  Do not feel she needs transfer to trauma facility for management of these particularly given subacute nature.  I did discuss admission given her rectal bleeding in the setting of Plavix use.  Patient is agreeable.  Will reach out to hospitalist team.  Case reviewed with hospitalist team.  They will evaluate the patient for anticipated admission.      FINAL CLINICAL IMPRESSION(S) / ED DIAGNOSES   Final diagnoses:  Acute GI bleeding  Compression fracture of body of thoracic vertebra (HCC)     Rx / DC Orders   ED Discharge Orders     None        Note:  This document was prepared using Dragon voice recognition software and may include unintentional dictation errors.   Trinna Post,  MD 06/28/23 [phone]

## 2023-06-28 NOTE — ED Notes (Signed)
MD aware of pt's BP and headache

## 2023-06-28 NOTE — ED Triage Notes (Signed)
First Nurse Note: Patient to ED via [facility] from home for rectal bleeding. Started this AM at 4am- bright red. Does take blood thinners. Denies hx for same. Currently of meds for shingles. Also had fall 3 weeks ago and having back pain form that fall.   20 L AC  160's BP 100 HR 98 166 cbg

## 2023-06-28 NOTE — ED Notes (Signed)
Admitting provider at bedside.

## 2023-06-29 ENCOUNTER — Inpatient Hospital Stay: Payer: Medicare Other | Admitting: Registered Nurse

## 2023-06-29 ENCOUNTER — Encounter
Admission: EM | Disposition: A | Discharge status: Home Health | Payer: Self-pay | Source: Home / Self Care | Attending: Internal Medicine

## 2023-06-29 ENCOUNTER — Encounter: Payer: Self-pay | Admitting: Internal Medicine

## 2023-06-29 DIAGNOSIS — K635 Polyp of colon: Secondary | ICD-10-CM | POA: Diagnosis not present

## 2023-06-29 DIAGNOSIS — K5791 Diverticulosis of intestine, part unspecified, without perforation or abscess with bleeding: Secondary | ICD-10-CM

## 2023-06-29 DIAGNOSIS — K640 First degree hemorrhoids: Secondary | ICD-10-CM

## 2023-06-29 DIAGNOSIS — K5730 Diverticulosis of large intestine without perforation or abscess without bleeding: Secondary | ICD-10-CM

## 2023-06-29 HISTORY — PX: COLONOSCOPY WITH PROPOFOL: SHX5780

## 2023-06-29 LAB — BASIC METABOLIC PANEL
Anion gap: 10 (ref 5–15)
BUN: 34 mg/dL — ABNORMAL HIGH (ref 8–23)
CO2: 20 mmol/L — ABNORMAL LOW (ref 22–32)
Calcium: 8.7 mg/dL — ABNORMAL LOW (ref 8.9–10.3)
Chloride: 110 mmol/L (ref 98–111)
Creatinine, Ser: 1.37 mg/dL — ABNORMAL HIGH (ref 0.44–1.00)
GFR, Estimated: 38 mL/min — ABNORMAL LOW (ref >60)
Glucose, Bld: 102 mg/dL — ABNORMAL HIGH (ref 70–99)
Potassium: 4.3 mmol/L (ref 3.5–5.1)
Sodium: 140 mmol/L (ref 135–145)

## 2023-06-29 LAB — CBC
HCT: 25.7 % — ABNORMAL LOW (ref 36.0–46.0)
Hemoglobin: 8.1 g/dL — ABNORMAL LOW (ref 12.0–15.0)
MCH: 34.8 pg — ABNORMAL HIGH (ref 26.0–34.0)
MCHC: 31.5 g/dL (ref 30.0–36.0)
MCV: 110.3 fL — ABNORMAL HIGH (ref 80.0–100.0)
Platelets: 188 10*3/uL (ref 150–400)
RBC: 2.33 MIL/uL — ABNORMAL LOW (ref 3.87–5.11)
RDW: 15.5 % (ref 11.5–15.5)
WBC: 7 10*3/uL (ref 4.0–10.5)
nRBC: 0 % (ref 0.0–0.2)

## 2023-06-29 LAB — HEMOGLOBIN AND HEMATOCRIT, BLOOD
HCT: 23 % — ABNORMAL LOW (ref 36.0–46.0)
Hemoglobin: 7.6 g/dL — ABNORMAL LOW (ref 12.0–15.0)

## 2023-06-29 SURGERY — COLONOSCOPY WITH PROPOFOL
Anesthesia: General

## 2023-06-29 MED ORDER — LIDOCAINE HCL (CARDIAC) PF 100 MG/5ML IV SOSY
PREFILLED_SYRINGE | INTRAVENOUS | Status: DC | PRN
  Administered 2023-06-29: 50 mg via INTRAVENOUS

## 2023-06-29 MED ORDER — FENTANYL CITRATE (PF) 100 MCG/2ML IJ SOLN
INTRAMUSCULAR | Status: DC | PRN
  Administered 2023-06-29: 25 ug via INTRAVENOUS

## 2023-06-29 MED ORDER — PROPOFOL 10 MG/ML IV BOLUS
INTRAVENOUS | Status: DC | PRN
  Administered 2023-06-29: 40 ug/kg/min via INTRAVENOUS
  Administered 2023-06-29: 40 mg via INTRAVENOUS
  Administered 2023-06-29: 30 mg via INTRAVENOUS
  Administered 2023-06-29: 40 mg via INTRAVENOUS
  Administered 2023-06-29: 20 mg via INTRAVENOUS

## 2023-06-29 MED ORDER — SODIUM CHLORIDE 0.9 % IV SOLN: INTRAVENOUS | Status: DC | PRN

## 2023-06-29 MED ORDER — FENTANYL CITRATE (PF) 100 MCG/2ML IJ SOLN
INTRAMUSCULAR | Status: AC
  Filled 2023-06-29: qty 2

## 2023-06-29 MED ORDER — PROPOFOL 10 MG/ML IV BOLUS
INTRAVENOUS | Status: AC
  Filled 2023-06-29: qty 20

## 2023-06-29 NOTE — Anesthesia Procedure Notes (Signed)
Procedure Name: MAC Date/Time: 06/29/2023 10:59 AM  Performed by: Lily Lovings, CRNAPre-anesthesia Checklist: Patient identified, Emergency Drugs available, Suction available and Patient being monitored Patient Re-evaluated:Patient Re-evaluated prior to induction Oxygen Delivery Method: Simple face mask Preoxygenation: Pre-oxygenation with 100% oxygen Induction Type: IV induction Comments: POM

## 2023-06-29 NOTE — Anesthesia Postprocedure Evaluation (Signed)
Anesthesia Post Note  Patient: Christine Burnett  Procedure(s) Performed: COLONOSCOPY WITH PROPOFOL  Patient location during evaluation: PACU Anesthesia Type: General Level of consciousness: awake and awake and alert Pain management: satisfactory to patient Vital Signs Assessment: post-procedure vital signs reviewed and stable Respiratory status: spontaneous breathing Cardiovascular status: stable Anesthetic complications: no   No notable events documented.   Last Vitals:  Vitals:   06/29/23 1146 06/29/23 1156  BP: (!) 156/77 (!) 147/82  Pulse: (!) 102 98  Resp: 18 (!) 21  Temp:    SpO2: 100% 100%    Last Pain:  Vitals:   06/29/23 1156  TempSrc:   PainSc: 0-No pain                 VAN STAVEREN,Alexiss Iturralde

## 2023-06-29 NOTE — ED Notes (Signed)
Pt IV Bandage soiled. This RN replaced Bandage and cleaned around site. IV Secured and flushes without issue.

## 2023-06-29 NOTE — Op Note (Signed)
[facility] Gastroenterology Patient Name: Christine Burnett Procedure Date: 06/29/2023 10:57 AM MRN: [id number] Account #: [id number] Date of Birth: 10-13-1940 Admit Type: Inpatient Age: 83 Room: [facility] ENDO ROOM 4 Gender: Female Note Status: Finalized Instrument Name: Prentice Docker [id number] Procedure:             Colonoscopy Indications:           Hematochezia Providers:             Midge Minium MD, MD Referring MD:          Danelle Berry (Referring MD) Medicines:             Propofol per Anesthesia Complications:         No immediate complications. Procedure:             Pre-Anesthesia Assessment:                        - Prior to the procedure, a History and Physical was                         performed, and patient medications and allergies were                         reviewed. The patient's tolerance of previous                         anesthesia was also reviewed. The risks and benefits                         of the procedure and the sedation options and risks                         were discussed with the patient. All questions were                         answered, and informed consent was obtained. Prior                         Anticoagulants: The patient has taken no anticoagulant                         or antiplatelet agents. ASA Grade Assessment: II - A                         patient with mild systemic disease. After reviewing                         the risks and benefits, the patient was deemed in                         satisfactory condition to undergo the procedure.                        After obtaining informed consent, the colonoscope was                         passed under direct vision. Throughout the procedure,  the patient's blood pressure, pulse, and oxygen                         saturations were monitored continuously. The                         Colonoscope was introduced through the anus and                          advanced to the the cecum, identified by appendiceal                         orifice and ileocecal valve. The colonoscopy was                         performed without difficulty. The patient tolerated                         the procedure well. The quality of the bowel                         preparation was good. Findings:      The perianal and digital rectal examinations were normal.      Multiple small-mouthed diverticula were found in the sigmoid colon and       descending colon.      Red blood was found in the rectum, in the recto-sigmoid colon, in the       sigmoid colon, in the descending colon and at the splenic flexure.      Many sessile polyps were found in the entire colon. Polypectomy was not       attempted.      Non-bleeding internal hemorrhoids were found during retroflexion. The       hemorrhoids were Grade I (internal hemorrhoids that do not prolapse). Impression:            - Diverticulosis in the sigmoid colon and in the                         descending colon.                        - Blood in the rectum, in the recto-sigmoid colon, in                         the sigmoid colon, in the descending colon and at the                         splenic flexure.                        - Many polyps in the entire colon. Resection not                         attempted.                        - Non-bleeding internal hemorrhoids.                        - No specimens collected. Recommendation:        -  Return patient to hospital ward for ongoing care.                        - Resume previous diet.                        - Continue present medications.                        - Bleeding likely from the diverticuli.                        If further bleeding then IR vs. vascular surgery. Procedure Code(s):     --- Professional ---                        [phone], Colonoscopy, flexible; diagnostic, including                         collection of specimen(s) by brushing or washing,  when                         performed (separate procedure) Diagnosis Code(s):     --- Professional ---                        K92.1, Melena (includes Hematochezia) CPT copyright 2022 [employer]. All rights reserved. The codes documented in this report are preliminary and upon coder review may  be revised to meet current compliance requirements. Midge Minium MD, MD 06/29/2023 11:40:49 AM This report has been signed electronically. Number of Addenda: 0 Note Initiated On: 06/29/2023 10:57 AM Scope Withdrawal Time: 0 hours 8 minutes 23 seconds  Total Procedure Duration: 0 hours 23 minutes 41 seconds  Estimated Blood Loss:  Estimated blood loss: none.      [facility]

## 2023-06-29 NOTE — Progress Notes (Signed)
PROGRESS NOTE    Christine Burnett  ZOX:[id number] DOB: 1940-09-20 DOA: 06/28/2023 PCP: Danelle Berry, PA-C   Assessment & Plan:   Principal Problem:   GI bleed Active Problems:   Acute GI bleeding  Assessment and Plan:  GI bleed: bright red blood as per pt. Likely secondary to diverticular bleed.  S/p colonoscopy which showed diverticulosis in sigmoid, descending colon; blood in rectum, recto-sigmoid, descending & splenic flexure & also many polyps in the entire colon & non-bleeding internal hemorrhoids.  Holding home dose of aspirin, brilinta. If continues to bleed, will order tagged RBC scan. Will continue to monitor H&H    Shingles: active. Continue on acyclovir. 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 Continue to hold brilinta, aspirin  HLD: continue on statin    Depression: severity unknown. 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 Continue on B12 supplement. Folate was WNL in 05/2023. 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 Avoid nephrotoxic meds    Leukocytosis: resolved    T11 compression fracture: likely secondary to fall at home several weeks ago. PT/OT recs HH    Small subcapsular hematoma: incidental finding on CT abd/pelvis. Continue w/ supportive care    Hx of lung cancer: in remission      DVT prophylaxis: SCDs Code Status: full  Family Communication: discussed pt's care w/ pt's family at bedside and answered their questions  Disposition Plan: likely d/c back home   Level of care: Telemetry Medical  Status is: Inpatient Remains inpatient appropriate because: severity of illness    Consultants:  GI   Procedures:   Antimicrobials:    Subjective: Pt c/o bloody bowel movements this morning and overnight   Objective: Vitals:   06/29/23 1136 06/29/23 1137 06/29/23 1146 06/29/23 1156  BP: 134/61 134/61 (!) 156/77 (!) 147/82   Pulse: 99 99 (!) 102 98  Resp: 20 15 18  (!) 21  Temp:      TempSrc:      SpO2: 100% 100% 100% 100%  Weight:      Height:        Intake/Output Summary (Last 24 hours) at 06/29/2023 1400 Last data filed at 06/29/2023 1139 Gross per 24 hour  Intake 278.49 ml  Output --  Net 278.49 ml   Filed Weights   06/28/23 1110  Weight: 57.2 kg    Examination:  General exam: appears comfortable  Respiratory system: decreased breath sounds b/l  Cardiovascular system: S1/S2+. No rubs or gallops  Gastrointestinal system: Abd is soft, ND, tenderness to palpation & hyperactive bowel sounds Central nervous system: alert & oriented. Moves all extremities Psychiatry: Judgement and insight appears at baseline. Flat mood and affect    Data Reviewed: I have personally reviewed following labs and imaging studies  CBC: Recent Labs  Lab 06/28/23 1114 06/29/23 0622  WBC 12.1* 7.0  HGB 11.5* 8.1*  HCT 34.6* 25.7*  MCV 100.3* 110.3*  PLT 283 188   Basic Metabolic Panel: Recent Labs  Lab 06/28/23 1114 06/29/23 0622  NA 138 140  K 4.3 4.3  CL 103 110  CO2 23 20*  GLUCOSE 126* 102*  BUN 40* 34*  CREATININE 1.58* 1.37*  CALCIUM 9.4 8.7*   GFR: Estimated Creatinine Clearance: 28.1 mL/min (A) (by C-G formula based on SCr of 1.37 mg/dL (H)). Liver Function Tests: Recent Labs  Lab 06/28/23 1114  AST 18  ALT 16  ALKPHOS 117  BILITOT 0.7  PROT 7.7  ALBUMIN 3.6   No results for input(s): "LIPASE", "AMYLASE" in the last 168 hours. No results for input(s): "AMMONIA" in the last 168 hours. Coagulation Profile: No results for input(s): "INR", "PROTIME" in the last 168 hours. Cardiac Enzymes: No results for input(s): "CKTOTAL", "CKMB", "CKMBINDEX", "TROPONINI" in the last 168 hours. BNP (last 3 results) No results for input(s): "PROBNP" in the last 8760 hours. HbA1C: No results for input(s): "HGBA1C" in the last 72 hours. CBG: No results for input(s): "GLUCAP" in the last 168  hours. Lipid Profile: No results for input(s): "CHOL", "HDL", "LDLCALC", "TRIG", "CHOLHDL", "LDLDIRECT" in the last 72 hours. Thyroid Function Tests: No results for input(s): "TSH", "T4TOTAL", "FREET4", "T3FREE", "THYROIDAB" in the last 72 hours. Anemia Panel: No results for input(s): "VITAMINB12", "FOLATE", "FERRITIN", "TIBC", "IRON", "RETICCTPCT" in the last 72 hours. Sepsis Labs: No results for input(s): "PROCALCITON", "LATICACIDVEN" in the last 168 hours.  No results found for this or any previous visit (from the past 240 hours).       Radiology Studies: CT L-SPINE NO CHARGE Result Date: 06/28/2023 CLINICAL DATA:  Rectal bleeding. Back pain after falling 3 weeks ago. On blood thinners. EXAM: CT Lumbar Spine without contrast TECHNIQUE: Technique: Multiplanar CT images of the lumbar spine were reconstructed from contemporary CT of the Abdomen and Pelvis. RADIATION DOSE REDUCTION: This exam was performed according to the departmental dose-optimization program which includes automated exposure control, adjustment of the mA and/or kV according to patient size and/or use of iterative reconstruction technique. CONTRAST:  No additional COMPARISON:  CT of the chest, abdomen and pelvis 04/22/2023. Lumbar spine CT 10/31/2022. FINDINGS: Segmentation: There are 5 lumbar type vertebral bodies. Alignment: Normal. Vertebrae: Chronic superior endplate compression fracture at L1 is unchanged. There is a mild superior endplate compression deformity at T11 which is new from 04/22/2023 and appears acute. This is associated with 40% loss of vertebral body height and 3 mm of osseous retropulsion. No other acute fractures are identified. Fracture of the right 10th rib adjacent to the costovertebral junction is not acute. Paraspinal and other soft tissues: No acute paraspinal findings. Intra-abdominal findings dictated separately. Disc levels: Chronic spondylosis, greatest at L4-5 and L5-S1. No large disc herniation or  high-grade spinal stenosis. There is mild left-sided foraminal narrowing at L4-5 and mild biforaminal narrowing at L5-S1 which appears chronic. IMPRESSION: 1. Acute superior endplate compression fracture at T11 with 40% loss of vertebral body height and 3 mm of osseous retropulsion. 2. No acute lumbar spine findings demonstrated. Chronic superior endplate compression fracture at L1. 3. Chronic spondylosis without high-grade spinal stenosis. 4. Abdominopelvic findings dictated separately. Electronically Signed   By: Carey Bullocks M.D.   On: 06/28/2023 13:37   CT ABDOMEN PELVIS W CONTRAST Result Date: 06/28/2023 CLINICAL DATA:  Abdominal pain, acute, nonlocalized. Rectal bleeding. History of lung cancer. Fall 3 weeks ago with back pain. EXAM: CT ABDOMEN AND PELVIS WITH CONTRAST TECHNIQUE: Multidetector CT imaging of the abdomen and pelvis was performed using the standard protocol following bolus administration of intravenous contrast. RADIATION DOSE REDUCTION: This exam was performed according to the departmental dose-optimization program which includes automated exposure control, adjustment of the mA and/or kV according to patient size and/or use of iterative reconstruction technique. CONTRAST:  75mL OMNIPAQUE IOHEXOL 300 MG/ML  SOLN COMPARISON:  04/22/2023 FINDINGS: Lower chest: Again noted is chronic consolidation in the right lower lobe. Consolidation at the right lung base may  have increased compared to the previous chest CT. No acute abnormality at the left lung base. Hepatobiliary: Normal appearance of the liver, gallbladder and portal venous system. No significant biliary dilatation. Pancreas: Unremarkable. No pancreatic ductal dilatation or surrounding inflammatory changes. Spleen: Small subcapsular fluid collection around the superolateral aspect of the spleen. This small collection measures up to 7 mm in thickness. This small subcapsular fluid collection appears to be new since November 2024.  Hounsfield units of the subcapsular fluid is approximately 38. No significant inflammation around the spleen. No significant parenchymal defect or infarct. Adrenals/Urinary Tract: Normal appearance of the adrenal glands. Cortical scarring in both kidneys without hydronephrosis. Bilateral renal calcifications could represent nonobstructive stones versus vascular calcifications. Small hypodensity in left kidney upper pole is too small to definitively characterize. No suspicious renal lesions. Normal appearance of the urinary bladder. Stomach/Bowel: High-density material in the proximal sigmoid colon on image 54/to is indeterminate. This could represent GI bleeding but limited evaluation without non contrast images. Colonic diverticula scattered throughout the colon. No acute colonic inflammation. Normal appendix. No bowel dilatation or obstruction. Normal appearance of the stomach. Probable duodenal diverticulum near the ampulla. Vascular/Lymphatic: Extensive atherosclerotic disease in the abdominal aorta with infrarenal abdominal aortic aneurysm measures up to 3.7 cm. Extensive atherosclerotic disease involving bilateral iliac arteries. Concern for areas of stenosis involving the right common and right external iliac artery. Proximal femoral arteries are patent. Calcified plaque and stenosis in left common femoral artery. Atherosclerotic plaque involving the visceral arteries but the celiac artery and SMA are patent. IMA is probably patent. Limited evaluation of the renal arteries. No significant lymph node enlargement in the abdomen or pelvis. Reproductive: Uterus and bilateral adnexa are unremarkable. Other: No free fluid. Negative for free air. Probable small left inguinal hernia containing fat. Negative for free air. Musculoskeletal: Old bilateral pubic rami fractures. Dynamic hip screw in the left hip. Joint space narrowing degenerative changes in the right hip. Old vertebral body compression fracture at L1.  There is a new compression fracture involving the superior endplate of T11. Chronic disc and endplate changes in lower lumbar spine. IMPRESSION: 1. High-density material in the proximal sigmoid colon. This could represent active GI bleeding but limited evaluation without non contrast comparison imaging. 2. New vertebral body compression fracture involving the superior endplate of T11. 3. Colonic diverticulosis without acute diverticulitis. 4. Small subcapsular splenic collection. The subcapsular fluid is slightly dense and probably represents a small subcapsular hematoma. No acute inflammatory changes involving the spleen and this could be a subacute hematoma. No significant splenic infarct. 5. Infrarenal abdominal aortic aneurysm measures up to 3.7 cm. Recommend follow-up ultrasound every 3 years. (Ref.: J Vasc Surg. 2018; 67:2-77 and J Am Coll Radiol 2013;10(10):789-794.) 6. Chronic consolidation in the right lower lobe. Consolidation at the right lung base may have slightly increased compared to the previous chest CT. 7. Aortic Atherosclerosis (ICD10-I70.0). These results were called by telephone at the time of interpretation on 06/28/2023 at 1:21 pm to provider NEHA RAY , who verbally acknowledged these results. Electronically Signed   By: Richarda Overlie M.D.   On: 06/28/2023 13:31        Scheduled Meds:  acyclovir  800 mg Oral QID   atorvastatin  40 mg Oral Daily   busPIRone  5 mg Oral BID   vitamin B-12  1,000 mcg Oral Daily   PARoxetine  40 mg Oral Daily   sodium chloride flush  3 mL Intravenous Q12H   Continuous Infusions:  LOS: 1 day       Charise Killian, MD [facility] Pager [phone]  If 7PM-7AM, please contact night-coverage [email or web address] 06/29/2023, 2:00 PM

## 2023-06-29 NOTE — Evaluation (Addendum)
Occupational Therapy Evaluation Patient Details Name: Christine Burnett MRN: [id number] DOB: Sep 13, 1940 Today's Date: 06/29/2023   History of Present Illness 83 y/o female presented to ED on 06/28/23 for rectal bleeding. Admitted for possible GI bleed and has shingles. Awaiting colonoscopy. PMH: COPD, CKD III, depression, lung cancer (in remission), carotid stenosis, IDA, TIAs   Clinical Impression   Pt was seen for OT evaluation this date and co-tx with PT to optimize safety with ADL/mobility. Prior to hospital admission, pt reports she's generally independent with ADL and most IADL. Pt lives in a single story home, one daughter lives with her and works outside the home during the day. Another daughter lives locally and assists with transportation. Pt endorses several falls in the past 73mo, citing dizziness as fairly common reason. Pt presents to acute OT demonstrating impaired ADL performance and functional mobility 2/2 decreased strength, balance, activity tolerance  (See OT problem list for additional functional deficits). Pt currently requires CGA for ADL transfers and mobility with RW. Pt buckles at one point, citing fatigue and with encouragement ambulates back to the side of the bed with CGA +RW. Pt did endorse some mild dizziness upon sitting. BP noted to have dropped with positional changes and subjective improvement with return to sitting then supine. Pt would benefit from skilled OT services to address noted impairments and functional limitations (see below for any additional details) in order to maximize safety and independence while minimizing falls risk and caregiver burden.     If plan is discharge home, recommend the following: A little help with walking and/or transfers;A little help with bathing/dressing/bathroom;Assistance with cooking/housework;Assist for transportation;Help with stairs or ramp for entrance    Functional Status Assessment  Patient has had a recent decline in  their functional status and demonstrates the ability to make significant improvements in function in a reasonable and predictable amount of time.  Equipment Recommendations  None recommended by OT    Recommendations for Other Services       Precautions / Restrictions Precautions Precautions: Fall Restrictions Weight Bearing Restrictions Per Provider Order: No      Mobility Bed Mobility Overal bed mobility: Needs Assistance Bed Mobility: Supine to Sit, Sit to Supine     Supine to sit: Contact guard Sit to supine: Contact guard assist        Transfers Overall transfer level: Needs assistance Equipment used: Rolling walker (2 wheels) Transfers: Sit to/from Stand Sit to Stand: Contact guard assist                  Balance Overall balance assessment: Needs assistance Sitting-balance support: No upper extremity supported, Feet supported Sitting balance-Leahy Scale: Good     Standing balance support: Bilateral upper extremity supported, Reliant on assistive device for balance Standing balance-Leahy Scale: Fair                             ADL either performed or assessed with clinical judgement   ADL Overall ADL's : Needs assistance/impaired     Grooming: Sitting;Set up;Supervision/safety                   Toilet Transfer: Contact guard assist;BSC/3in1;Rolling walker (2 wheels)   Toileting- Clothing Manipulation and Hygiene: Sit to/from stand;Contact guard assist;Set up Toileting - Clothing Manipulation Details (indicate cue type and reason): Pt expresses difficulty reaching for pericare but then is able to complete it in standing with CGA and increased effort  Functional mobility during ADLs: Contact guard assist;Rolling walker (2 wheels)       Vision         Perception         Praxis         Pertinent Vitals/Pain Pain Assessment Pain Assessment: 0-10 Pain Score: 8  Pain Location: back Pain Descriptors / Indicators:  Discomfort, Grimacing, Guarding Pain Intervention(s): Limited activity within patient's tolerance, Monitored during session, Repositioned, Patient requesting pain meds-RN notified     Extremity/Trunk Assessment Upper Extremity Assessment Upper Extremity Assessment: Generalized weakness   Lower Extremity Assessment Lower Extremity Assessment: Generalized weakness   Cervical / Trunk Assessment Cervical / Trunk Assessment: Kyphotic   Communication Communication Communication: No apparent difficulties   Cognition Arousal: Alert Behavior During Therapy: WFL for tasks assessed/performed Overall Cognitive Status: No family/caregiver present to determine baseline cognitive functioning                                 General Comments: grossly WFL, does demonstrate PRN decreased safety awareness and awareness of deficits     General Comments  HR up to 130 with exertional activity, SpO2 WNL. Pt endorsing dizziness with drop in BP noted with mobility. BP supine 151/84, sitting after initial stand 127/77, and after ambulation in sitting 115/63. Pt endorsing improvement once returned to sitting then supine.    Exercises     Shoulder Instructions      Home Living Family/patient expects to be discharged to:: Private residence Living Arrangements: Children Available Help at Discharge: Family;Available PRN/intermittently (daughter works during the day) Type of Home: House Home Access: Stairs to enter [employer] of Steps: 4 Entrance Stairs-Rails: Right Home Layout: One level     Bathroom Shower/Tub: Tub/shower unit         Home Equipment: Cane - single point;Shower seat;BSC/3in1;Grab bars - tub/shower;Rollator (4 wheels)          Prior Functioning/Environment Prior Level of Function : Independent/Modified Independent;History of Falls (last six months)             Mobility Comments: uses rollator in the home; has had some falls 2/2 dizziness ADLs  Comments: IND with ADL performance and IADLs including cooking, cleaning, caring for her animal        OT Problem List: Decreased strength;Pain;Cardiopulmonary status limiting activity;Decreased activity tolerance;Decreased safety awareness;Decreased knowledge of use of DME or AE;Impaired balance (sitting and/or standing)      OT Treatment/Interventions: Self-care/ADL training;Therapeutic exercise;Therapeutic activities;DME and/or AE instruction;Patient/family education;Energy conservation;Balance training    OT Goals(Current goals can be found in the care plan section) Acute Rehab OT Goals Patient Stated Goal: feel better and go home OT Goal Formulation: With patient Time For Goal Achievement: 07/13/23 Potential to Achieve Goals: Good ADL Goals Pt Will Perform Lower Body Dressing: with modified independence;sit to/from stand Pt Will Transfer to Toilet: with modified independence;ambulating (LRAD) Pt Will Perform Toileting - Clothing Manipulation and hygiene: with modified independence Additional ADL Goal #1: Pt will verbalize plan to implement at least 2 learned falls prevention/energy conservation strategies into daily ADL/IADL/mobility routines to maximize safety.  OT Frequency: Min 1X/week    Co-evaluation PT/OT/SLP Co-Evaluation/Treatment: Yes Reason for Co-Treatment: For patient/therapist safety;To address functional/ADL transfers PT goals addressed during session: Balance;Mobility/safety with mobility OT goals addressed during session: ADL's and self-care;Proper use of Adaptive equipment and DME      AM-PAC OT "6 Clicks" Daily Activity     Outcome  Measure Help from another person eating meals?: None Help from another person taking care of personal grooming?: A Little Help from another person toileting, which includes using toliet, bedpan, or urinal?: A Little Help from another person bathing (including washing, rinsing, drying)?: A Little Help from another person to put on  and taking off regular upper body clothing?: None Help from another person to put on and taking off regular lower body clothing?: A Little 6 Click Score: 20   End of Session Equipment Utilized During Treatment: Rolling walker (2 wheels) Nurse Communication: Mobility status;Patient requests pain meds  Activity Tolerance: Patient tolerated treatment well Patient left: in bed;with call bell/phone within reach  OT Visit Diagnosis: Repeated falls (R29.6);Muscle weakness (generalized) (M62.81)                Time: 1610-9604 OT Time Calculation (min): 21 min Charges:  OT General Charges $OT Visit: 1 Visit OT Evaluation $OT Eval Low Complexity: 1 Low  Arman Filter., MPH, MS, OTR/L ascom [phone] 06/29/23, 1:55 PM

## 2023-06-29 NOTE — Anesthesia Preprocedure Evaluation (Addendum)
Anesthesia Evaluation  Patient identified by MRN, date of birth, ID band Patient awake    Reviewed: Allergy & Precautions, NPO status , Patient's Chart, lab work & pertinent test results  Airway Mallampati: II  TM Distance: <3 FB Neck ROM: full    Dental  (+) Edentulous Lower   Pulmonary neg pulmonary ROS, COPD,  COPD inhaler, Patient abstained from smoking., former smoker   Pulmonary exam normal breath sounds clear to auscultation       Cardiovascular Exercise Tolerance: Poor hypertension, Pt. on medications + CAD and + Peripheral Vascular Disease  negative cardio ROS Normal cardiovascular exam Rhythm:Regular Rate:Normal     Neuro/Psych   Anxiety     TIAnegative neurological ROS  negative psych ROS   GI/Hepatic negative GI ROS, Neg liver ROS,,,  Endo/Other  negative endocrine ROS    Renal/GU CRFRenal diseasenegative Renal ROS  negative genitourinary   Musculoskeletal  (+) Arthritis ,    Abdominal Normal abdominal exam  (+)   Peds negative pediatric ROS (+)  Hematology negative hematology ROS (+)   Anesthesia Other Findings Past Medical History: No date: Allergic rhinitis No date: Anxiety Me: Arthritis No date: Chronic bronchitis (HCC) 08/31/2017: Chronic kidney disease, stage III (moderate) (HCC)     Comment:  Seeing nephrologist No date: CKD (chronic kidney disease), stage III (HCC) No date: COPD (chronic obstructive pulmonary disease) (HCC) No date: Cornea scar No date: Depression     Comment:  unspecified No date: Diverticulosis Me: Emphysema of lung (HCC) 12/24/2017: Hx of fracture of left hip No date: Hyperlipidemia 08/31/2017: Hyperparathyroidism, secondary renal (HCC)     Comment:  Managed by nephrologist No date: Non-small cell lung cancer (NSCLC) (HCC) No date: Osteoporosis Me: Oxygen deficiency No date: Risk for falls No date: Situational disturbance 2011: Stroke (HCC)     Comment:   tia's x 2 No date: TIA (transient ischemic attack)  Past Surgical History: 04/20/2023: CAROTID PTA/STENT INTERVENTION; Right     Comment:  Procedure: CAROTID PTA/STENT INTERVENTION;  Surgeon:               Annice Needy, MD;  Location: [facility] CV [facility];                Service: Cardiovascular;  Laterality: Right;  Inominate               Artery Stenosis requiring stenting 2016: COLONOSCOPY No date: ESOPHAGUS SURGERY 03/26/2017: INTRAMEDULLARY (IM) NAIL INTERTROCHANTERIC; Left     Comment:  Procedure: INTRAMEDULLARY (IM) NAIL INTERTROCHANTRIC;                Surgeon: Kennedy Bucker, MD;  Location: [facility] ORS;                Service: Orthopedics;  Laterality: Left; 04/15/2021: LEFT HEART CATH AND CORONARY ANGIOGRAPHY; N/A     Comment:  Procedure: LEFT HEART CATH AND CORONARY ANGIOGRAPHY;                Surgeon: Iran Ouch, MD;  Location: [facility] INVASIVE               CV LAB;  Service: Cardiovascular;  Laterality: N/A; 05/15/2022: RIGHT/LEFT HEART CATH AND CORONARY ANGIOGRAPHY; Bilateral     Comment:  Procedure: RIGHT/LEFT HEART CATH AND CORONARY               ANGIOGRAPHY;  Surgeon: Iran Ouch, MD;  Location:  [facility] CV [facility];  Service: Cardiovascular;                Laterality: Bilateral; 06/28/2020: VIDEO BRONCHOSCOPY WITH ENDOBRONCHIAL NAVIGATION; Right     Comment:  Procedure: VIDEO BRONCHOSCOPY WITH ENDOBRONCHIAL               NAVIGATION CELLVIZIO;  Surgeon: Salena Saner, MD;                Location: [facility] ORS;  Service: Pulmonary;  Laterality:               Right;  BMI    Body Mass Index: 20.35 kg/m      Reproductive/Obstetrics negative OB ROS                             Anesthesia Physical Anesthesia Plan  ASA: 3  Anesthesia Plan: General   Post-op Pain Management:    Induction: Intravenous  PONV Risk Score and Plan: Propofol infusion and TIVA  Airway Management Planned: Natural Airway and Nasal  Cannula  Additional Equipment:   Intra-op Plan:   Post-operative Plan:   Informed Consent: I have reviewed the patients History and Physical, chart, labs and discussed the procedure including the risks, benefits and alternatives for the proposed anesthesia with the patient or authorized representative who has indicated his/her understanding and acceptance.     Dental Advisory Given  Plan Discussed with: CRNA  Anesthesia Plan Comments:        Anesthesia Quick Evaluation

## 2023-06-29 NOTE — Evaluation (Signed)
Physical Therapy Evaluation Patient Details Name: Christine Burnett MRN: [id number] DOB: 1940-09-20 Today's Date: 06/29/2023  History of Present Illness  83 y/o female presented to ED on 06/28/23 for rectal bleeding. Admitted for possible GI bleed and has shingles. Awaiting colonoscopy. PMH: COPD, CKD III, depression, lung cancer (in remission), carotid stenosis, IDA, TIAs  Clinical Impression  Patient admitted with the above. PTA, patient lives with daughter but reports being modI with use of rollator in the home and community. Patient endorses ~5 falls and reports she feels dizzy at times leading up to the fall. Patient presents with weakness, impaired balance, and decreased activity tolerance. Complaining of R sided low back pain throughout session. Overall, functioning at [employer] for mobility with use of RW (~20-25'). BP monitored during mobility as patient complaining of mild dizziness with drop noted below. MD and RN notified via secure chat by OT. Patient will benefit from skilled PT services during acute stay to address listed deficits. Patient will benefit from ongoing therapy at discharge to maximize functional independence and safety.   Orthostatic BPs  Supine 151/84  Sitting after initial stand  127/77  Sitting after ambulation  115/63          If plan is discharge home, recommend the following: A little help with walking and/or transfers;A little help with bathing/dressing/bathroom;Assistance with cooking/housework;Assist for transportation;Help with stairs or ramp for entrance   Can travel by private vehicle        Equipment Recommendations Rolling Aleea Hendry (2 wheels)  Recommendations for Other Services       Functional Status Assessment Patient has had a recent decline in their functional status and demonstrates the ability to make significant improvements in function in a reasonable and predictable amount of time.     Precautions / Restrictions Precautions Precautions:  Fall Restrictions Weight Bearing Restrictions Per Provider Order: No      Mobility  Bed Mobility Overal bed mobility: Needs Assistance Bed Mobility: Supine to Sit, Sit to Supine     Supine to sit: Contact guard Sit to supine: Contact guard assist        Transfers Overall transfer level: Needs assistance Equipment used: Rolling Kenzi Bardwell (2 wheels) Transfers: Sit to/from Stand Sit to Stand: Contact guard assist                Ambulation/Gait Ambulation/Gait assistance: Contact guard assist Gait Distance (Feet): 25 Feet Assistive device: Rolling Sandralee Tarkington (2 wheels) Gait Pattern/deviations: Step-to pattern, Decreased stride length Gait velocity: decreased     General Gait Details: R LE internally rotated throughout. Patient stating this is normal and she found out through HHPT that she does it. CGA for safety. Patient fatigues quickly due to pain and demonstrating mild B Knee buckling, however unsure if it was intentional on patient's part  Stairs            Wheelchair Mobility     Tilt Bed    Modified Rankin (Stroke Patients Only)       Balance Overall balance assessment: Needs assistance Sitting-balance support: No upper extremity supported, Feet supported Sitting balance-Leahy Scale: Good     Standing balance support: Bilateral upper extremity supported, Reliant on assistive device for balance Standing balance-Leahy Scale: Fair                               Pertinent Vitals/Pain Pain Assessment Pain Assessment: Faces Faces Pain Scale: Hurts even more Pain Location: back Pain Descriptors /  Indicators: Discomfort, Grimacing, Guarding Pain Intervention(s): Monitored during session, Repositioned, Limited activity within patient's tolerance    Home Living Family/patient expects to be discharged to:: Private residence Living Arrangements: Children Available Help at Discharge: Family;Available PRN/intermittently (daughter works during  the day) Type of Home: House Home Access: Stairs to enter Entrance Stairs-Rails: Right Entrance Stairs-Number of Steps: 4   Home Layout: One level Home Equipment: Cane - single point;Shower seat;BSC/3in1;Grab bars - tub/shower;Rollator (4 wheels)      Prior Function Prior Level of Function : Independent/Modified Independent;History of Falls (last six months)             Mobility Comments: uses rollator in the home; has had some falls 2/2 dizziness ADLs Comments: IND with ADL performance and IADLs including cooking, cleaning, caring for her animal     Extremity/Trunk Assessment   Upper Extremity Assessment Upper Extremity Assessment: Defer to OT evaluation    Lower Extremity Assessment Lower Extremity Assessment: Generalized weakness    Cervical / Trunk Assessment Cervical / Trunk Assessment: Kyphotic  Communication   Communication Communication: No apparent difficulties  Cognition Arousal: Alert Behavior During Therapy: WFL for tasks assessed/performed Overall Cognitive Status: Within Functional Limits for tasks assessed                                          General Comments General comments (skin integrity, edema, etc.): HR up to 130 with activity. Patient complaining of dizziness with drop in BP with mobility. BP supine 151/84, sitting after initial stand 127/77, BP after ambulation 115/63 (complaining of dizziness)    Exercises     Assessment/Plan    PT Assessment Patient needs continued PT services  PT Problem List Decreased strength;Decreased activity tolerance;Decreased balance;Decreased mobility;Decreased safety awareness;Cardiopulmonary status limiting activity       PT Treatment Interventions Gait training;DME instruction;Stair training;Functional mobility training;Therapeutic activities;Balance training;Therapeutic exercise;Patient/family education    PT Goals (Current goals can be found in the Care Plan section)  Acute Rehab PT  Goals Patient Stated Goal: to go home PT Goal Formulation: With patient Time For Goal Achievement: 07/13/23 Potential to Achieve Goals: Fair    Frequency Min 1X/week     Co-evaluation PT/OT/SLP Co-Evaluation/Treatment: Yes Reason for Co-Treatment: For patient/therapist safety;To address functional/ADL transfers PT goals addressed during session: Balance;Mobility/safety with mobility         AM-PAC PT "6 Clicks" Mobility  Outcome Measure Help needed turning from your back to your side while in a flat bed without using bedrails?: A Little Help needed moving from lying on your back to sitting on the side of a flat bed without using bedrails?: A Little Help needed moving to and from a bed to a chair (including a wheelchair)?: A Little Help needed standing up from a chair using your arms (e.g., wheelchair or bedside chair)?: A Little Help needed to walk in hospital room?: A Little Help needed climbing 3-5 steps with a railing? : A Little 6 Click Score: 18    End of Session   Activity Tolerance: Patient tolerated treatment well Patient left: in bed;with call bell/phone within reach Nurse Communication: Mobility status PT Visit Diagnosis: Unsteadiness on feet (R26.81);Muscle weakness (generalized) (M62.81)    Time: 1610-9604 PT Time Calculation (min) (ACUTE ONLY): 21 min   Charges:   PT Evaluation $PT Eval Moderate Complexity: 1 Mod   PT General Charges $$ ACUTE PT VISIT: 1 Visit  Maylon Peppers, PT, DPT Physical Therapist - [place]  [facility]   Jahson Emanuele A Ryli Standlee 06/29/2023, 9:59 AM

## 2023-06-29 NOTE — Transfer of Care (Signed)
Immediate Anesthesia Transfer of Care Note  Patient: NARYAH CLENNEY  Procedure(s) Performed: COLONOSCOPY WITH PROPOFOL  Patient Location: Endoscopy Unit  Anesthesia Type:General  Level of Consciousness: drowsy and patient cooperative  Airway & Oxygen Therapy: Patient Spontanous Breathing and Patient connected to face mask oxygen  Post-op Assessment: Report given to RN and Post -op Vital signs reviewed and stable  Post vital signs: Reviewed and stable  Last Vitals:  Vitals Value Taken Time  BP 134/61 06/29/23 1137  Temp    Pulse 99 06/29/23 1137  Resp 15 06/29/23 1137  SpO2 100 % 06/29/23 1137    Last Pain:  Vitals:   06/29/23 1137  TempSrc:   PainSc: 0-No pain         Complications: No notable events documented.

## 2023-06-29 NOTE — Progress Notes (Signed)
Due to the patient being on Air borne Isolation the patient had to return to the ED isolation rooom and wait for a negative pressure isolation room on the floor. Endo does not have a negative pressure isolation room.

## 2023-06-29 NOTE — ED Notes (Addendum)
Reached out to MD regarding NPO status. Per MD Dr. Servando Snare, okay for pt to take oral meds with sips of water.

## 2023-06-30 DIAGNOSIS — K5791 Diverticulosis of intestine, part unspecified, without perforation or abscess with bleeding: Secondary | ICD-10-CM | POA: Diagnosis not present

## 2023-06-30 LAB — BASIC METABOLIC PANEL
Anion gap: 8 (ref 5–15)
BUN: 25 mg/dL — ABNORMAL HIGH (ref 8–23)
CO2: 23 mmol/L (ref 22–32)
Calcium: 8.8 mg/dL — ABNORMAL LOW (ref 8.9–10.3)
Chloride: 106 mmol/L (ref 98–111)
Creatinine, Ser: 1.35 mg/dL — ABNORMAL HIGH (ref 0.44–1.00)
GFR, Estimated: 39 mL/min — ABNORMAL LOW (ref >60)
Glucose, Bld: 114 mg/dL — ABNORMAL HIGH (ref 70–99)
Potassium: 4.3 mmol/L (ref 3.5–5.1)
Sodium: 137 mmol/L (ref 135–145)

## 2023-06-30 LAB — CBC
HCT: 23 % — ABNORMAL LOW (ref 36.0–46.0)
Hemoglobin: 7.7 g/dL — ABNORMAL LOW (ref 12.0–15.0)
MCH: 35 pg — ABNORMAL HIGH (ref 26.0–34.0)
MCHC: 33.5 g/dL (ref 30.0–36.0)
MCV: 104.5 fL — ABNORMAL HIGH (ref 80.0–100.0)
Platelets: 175 10*3/uL (ref 150–400)
RBC: 2.2 MIL/uL — ABNORMAL LOW (ref 3.87–5.11)
RDW: 15.6 % — ABNORMAL HIGH (ref 11.5–15.5)
WBC: 10 10*3/uL (ref 4.0–10.5)
nRBC: 0 % (ref 0.0–0.2)

## 2023-06-30 MED ORDER — SIMETHICONE 80 MG PO CHEW
80.0000 mg | CHEWABLE_TABLET | Freq: Four times a day (QID) | ORAL | Status: DC | PRN
  Administered 2023-06-30 – 2023-07-03 (×4): 80 mg via ORAL
  Filled 2023-06-30 (×6): qty 1

## 2023-06-30 NOTE — Progress Notes (Signed)
Mobility Specialist - Progress Note     06/30/23 1100  Mobility  Activity Ambulated with assistance in hallway;Stood at bedside;Dangled on edge of bed  Level of Assistance Contact guard assist, steadying assist  Assistive Device Front wheel walker  Distance Ambulated (ft) 120 ft  Range of Motion/Exercises Active  Activity Response Tolerated well  Mobility Referral Yes  Mobility visit 1 Mobility  Mobility Specialist Start Time (ACUTE ONLY) 1035  Mobility Specialist Stop Time (ACUTE ONLY) 1056  Mobility Specialist Time Calculation (min) (ACUTE ONLY) 21 min   Pt resting in bed on RA upon entry. Pt STS and stood for 2-3 minutes to regain stability. Pt had small buckles but recovered quickly. Pt ambulates to hallway around NS CGA with RW. Pt endorses pain in back (RN gave pain meds right before session). Pt endorses SOB at 60 ft and took 2 minute seated rest break. Pt returned to bed and left with needs in reach.   Johnathan Hausen Mobility Specialist 06/30/23, 11:04 AM

## 2023-06-30 NOTE — Plan of Care (Signed)
  Problem: Education: Goal: Knowledge of discharge needs will improve Outcome: Progressing   Problem: Clinical Measurements: Goal: Postoperative complications will be avoided or minimized Outcome: Progressing   Problem: Respiratory: Goal: Will achieve and/or maintain a regular respiratory rate, without signs or symptoms of dyspnea Outcome: Progressing   Problem: Skin Integrity: Goal: Demonstration of wound healing without infection will improve Outcome: Progressing

## 2023-06-30 NOTE — Progress Notes (Signed)
PROGRESS NOTE    Christine Burnett  AOZ:[id number] DOB: [birth year] DOA: 06/28/2023 PCP: Danelle Berry, PA-C   Assessment & Plan:   Principal Problem:   GI bleed Active Problems:   Acute GI bleeding  Assessment and Plan:  GI bleed: bright red blood as per pt. Likely secondary to diverticular bleed.  S/p colonoscopy which showed diverticulosis in sigmoid, descending colon; blood in rectum, recto-sigmoid, descending & splenic flexure & also many polyps in the entire colon & non-bleeding internal hemorrhoids. Holding home dose of aspirin, brilinta. If continues to bleed, will order tagged RBC scan. Will continue to monitor H&H   Shingles: active. Continue on acyclovir. 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 Continue to hold aspirin, brilinta   HLD: continue on statin    Depression: severity unknown. Continue on home dose of buspar, paxil    Macrocytic anemia: B12 was 410 05/2023. Continue on B12 supplement. Folate was WNL in Dec 2024. 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 Avoid nephrotoxic meds   Leukocytosis: resolved    T11 compression fracture: likely secondary to fall at home several weeks ago. PT/OT recs HH    Small subcapsular hematoma: incidental finding on CT abd/pelvis. Continue w/ supportive care    Hx of lung cancer: in remission      DVT prophylaxis: SCDs Code Status: full  Family Communication:  Disposition Plan: likely d/c back home   Level of care: Telemetry Medical  Status is: Inpatient Remains inpatient appropriate because: severity of illness    Consultants:  GI   Procedures:   Antimicrobials:    Subjective: Pt c/o fatigue   Objective: Vitals:   06/29/23 1146 06/29/23 1156 06/29/23 1544 06/30/23 0733  BP: (!) 156/77 (!) 147/82 (!) 160/82 118/61  Pulse: (!) 102 98 (!) 103 (!) 108  Resp: 18 (!) 21 16 16   Temp:   98.7 F (37.1 C) 98.4 F (36.9 C)  TempSrc:   Oral    SpO2: 100% 100% 98% 93%  Weight:      Height:        Intake/Output Summary (Last 24 hours) at 06/30/2023 0818 Last data filed at 06/29/2023 1139 Gross per 24 hour  Intake 268.49 ml  Output --  Net 268.49 ml   Filed Weights   06/28/23 1110  Weight: 57.2 kg    Examination:  General exam: appears calm & comfortable  Respiratory system: diminished breath sounds b/l  Cardiovascular system: S1 & S2+. No rubs or gallops  Gastrointestinal system: abd is soft, NT, ND & normal bowel sounds  Central nervous system: alert & oriented. Moves all extremities  Psychiatry: Judgement and insight appears at baseline. Flat mood and affect     Data Reviewed: I have personally reviewed following labs and imaging studies  CBC: Recent Labs  Lab 06/28/23 1114 06/29/23 0622 06/29/23 1536 06/30/23 0357  WBC 12.1* 7.0  --  10.0  HGB 11.5* 8.1* 7.6* 7.7*  HCT 34.6* 25.7* 23.0* 23.0*  MCV 100.3* 110.3*  --  104.5*  PLT 283 188  --  175   Basic Metabolic Panel: Recent Labs  Lab 06/28/23 1114 06/29/23 0622 06/30/23 0357  NA 138 140 137  K 4.3 4.3 4.3  CL 103 110 106  CO2 23 20* 23  GLUCOSE 126* 102* 114*  BUN 40* 34* 25*  CREATININE 1.58* 1.37* 1.35*  CALCIUM 9.4 8.7* 8.8*   GFR: Estimated Creatinine Clearance:  28.5 mL/min (A) (by C-G formula based on SCr of 1.35 mg/dL (H)). Liver Function Tests: Recent Labs  Lab 06/28/23 1114  AST 18  ALT 16  ALKPHOS 117  BILITOT 0.7  PROT 7.7  ALBUMIN 3.6   No results for input(s): "LIPASE", "AMYLASE" in the last 168 hours. No results for input(s): "AMMONIA" in the last 168 hours. Coagulation Profile: No results for input(s): "INR", "PROTIME" in the last 168 hours. Cardiac Enzymes: No results for input(s): "CKTOTAL", "CKMB", "CKMBINDEX", "TROPONINI" in the last 168 hours. BNP (last 3 results) No results for input(s): "PROBNP" in the last 8760 hours. HbA1C: No results for input(s): "HGBA1C" in the last 72 hours. CBG: No results for  input(s): "GLUCAP" in the last 168 hours. Lipid Profile: No results for input(s): "CHOL", "HDL", "LDLCALC", "TRIG", "CHOLHDL", "LDLDIRECT" in the last 72 hours. Thyroid Function Tests: No results for input(s): "TSH", "T4TOTAL", "FREET4", "T3FREE", "THYROIDAB" in the last 72 hours. Anemia Panel: No results for input(s): "VITAMINB12", "FOLATE", "FERRITIN", "TIBC", "IRON", "RETICCTPCT" in the last 72 hours. Sepsis Labs: No results for input(s): "PROCALCITON", "LATICACIDVEN" in the last 168 hours.  No results found for this or any previous visit (from the past 240 hours).       Radiology Studies: CT L-SPINE NO CHARGE Result Date: 06/28/2023 CLINICAL DATA:  Rectal bleeding. Back pain after falling 3 weeks ago. On blood thinners. EXAM: CT Lumbar Spine without contrast TECHNIQUE: Technique: Multiplanar CT images of the lumbar spine were reconstructed from contemporary CT of the Abdomen and Pelvis. RADIATION DOSE REDUCTION: This exam was performed according to the departmental dose-optimization program which includes automated exposure control, adjustment of the mA and/or kV according to patient size and/or use of iterative reconstruction technique. CONTRAST:  No additional COMPARISON:  CT of the chest, abdomen and pelvis 04/22/2023. Lumbar spine CT 10/31/2022. FINDINGS: Segmentation: There are 5 lumbar type vertebral bodies. Alignment: Normal. Vertebrae: Chronic superior endplate compression fracture at L1 is unchanged. There is a mild superior endplate compression deformity at T11 which is new from 04/22/2023 and appears acute. This is associated with 40% loss of vertebral body height and 3 mm of osseous retropulsion. No other acute fractures are identified. Fracture of the right 10th rib adjacent to the costovertebral junction is not acute. Paraspinal and other soft tissues: No acute paraspinal findings. Intra-abdominal findings dictated separately. Disc levels: Chronic spondylosis, greatest at L4-5 and  L5-S1. No large disc herniation or high-grade spinal stenosis. There is mild left-sided foraminal narrowing at L4-5 and mild biforaminal narrowing at L5-S1 which appears chronic. IMPRESSION: 1. Acute superior endplate compression fracture at T11 with 40% loss of vertebral body height and 3 mm of osseous retropulsion. 2. No acute lumbar spine findings demonstrated. Chronic superior endplate compression fracture at L1. 3. Chronic spondylosis without high-grade spinal stenosis. 4. Abdominopelvic findings dictated separately. Electronically Signed   By: Carey Bullocks M.D.   On: 06/28/2023 13:37   CT ABDOMEN PELVIS W CONTRAST Result Date: 06/28/2023 CLINICAL DATA:  Abdominal pain, acute, nonlocalized. Rectal bleeding. History of lung cancer. Fall 3 weeks ago with back pain. EXAM: CT ABDOMEN AND PELVIS WITH CONTRAST TECHNIQUE: Multidetector CT imaging of the abdomen and pelvis was performed using the standard protocol following bolus administration of intravenous contrast. RADIATION DOSE REDUCTION: This exam was performed according to the departmental dose-optimization program which includes automated exposure control, adjustment of the mA and/or kV according to patient size and/or use of iterative reconstruction technique. CONTRAST:  75mL OMNIPAQUE IOHEXOL 300 MG/ML  SOLN  COMPARISON:  04/22/2023 FINDINGS: Lower chest: Again noted is chronic consolidation in the right lower lobe. Consolidation at the right lung base may have increased compared to the previous chest CT. No acute abnormality at the left lung base. Hepatobiliary: Normal appearance of the liver, gallbladder and portal venous system. No significant biliary dilatation. Pancreas: Unremarkable. No pancreatic ductal dilatation or surrounding inflammatory changes. Spleen: Small subcapsular fluid collection around the superolateral aspect of the spleen. This small collection measures up to 7 mm in thickness. This small subcapsular fluid collection appears to be  new since November 2024. Hounsfield units of the subcapsular fluid is approximately 38. No significant inflammation around the spleen. No significant parenchymal defect or infarct. Adrenals/Urinary Tract: Normal appearance of the adrenal glands. Cortical scarring in both kidneys without hydronephrosis. Bilateral renal calcifications could represent nonobstructive stones versus vascular calcifications. Small hypodensity in left kidney upper pole is too small to definitively characterize. No suspicious renal lesions. Normal appearance of the urinary bladder. Stomach/Bowel: High-density material in the proximal sigmoid colon on image 54/to is indeterminate. This could represent GI bleeding but limited evaluation without non contrast images. Colonic diverticula scattered throughout the colon. No acute colonic inflammation. Normal appendix. No bowel dilatation or obstruction. Normal appearance of the stomach. Probable duodenal diverticulum near the ampulla. Vascular/Lymphatic: Extensive atherosclerotic disease in the abdominal aorta with infrarenal abdominal aortic aneurysm measures up to 3.7 cm. Extensive atherosclerotic disease involving bilateral iliac arteries. Concern for areas of stenosis involving the right common and right external iliac artery. Proximal femoral arteries are patent. Calcified plaque and stenosis in left common femoral artery. Atherosclerotic plaque involving the visceral arteries but the celiac artery and SMA are patent. IMA is probably patent. Limited evaluation of the renal arteries. No significant lymph node enlargement in the abdomen or pelvis. Reproductive: Uterus and bilateral adnexa are unremarkable. Other: No free fluid. Negative for free air. Probable small left inguinal hernia containing fat. Negative for free air. Musculoskeletal: Old bilateral pubic rami fractures. Dynamic hip screw in the left hip. Joint space narrowing degenerative changes in the right hip. Old vertebral body  compression fracture at L1. There is a new compression fracture involving the superior endplate of T11. Chronic disc and endplate changes in lower lumbar spine. IMPRESSION: 1. High-density material in the proximal sigmoid colon. This could represent active GI bleeding but limited evaluation without non contrast comparison imaging. 2. New vertebral body compression fracture involving the superior endplate of T11. 3. Colonic diverticulosis without acute diverticulitis. 4. Small subcapsular splenic collection. The subcapsular fluid is slightly dense and probably represents a small subcapsular hematoma. No acute inflammatory changes involving the spleen and this could be a subacute hematoma. No significant splenic infarct. 5. Infrarenal abdominal aortic aneurysm measures up to 3.7 cm. Recommend follow-up ultrasound every 3 years. (Ref.: J Vasc Surg. 2018; 67:2-77 and J Am Coll Radiol 2013;10(10):789-794.) 6. Chronic consolidation in the right lower lobe. Consolidation at the right lung base may have slightly increased compared to the previous chest CT. 7. Aortic Atherosclerosis (ICD10-I70.0). These results were called by telephone at the time of interpretation on 06/28/2023 at 1:21 pm to provider NEHA RAY , who verbally acknowledged these results. Electronically Signed   By: Richarda Overlie M.D.   On: 06/28/2023 13:31        Scheduled Meds:  acyclovir  800 mg Oral QID   atorvastatin  40 mg Oral Daily   busPIRone  5 mg Oral BID   vitamin B-12  1,000 mcg Oral Daily  PARoxetine  40 mg Oral Daily   sodium chloride flush  3 mL Intravenous Q12H   Continuous Infusions:   LOS: 2 days       Charise Killian, MD [facility] Pager [phone]  If 7PM-7AM, please contact night-coverage [email or web address] 06/30/2023, 8:18 AM

## 2023-07-01 DIAGNOSIS — K5791 Diverticulosis of intestine, part unspecified, without perforation or abscess with bleeding: Secondary | ICD-10-CM | POA: Diagnosis not present

## 2023-07-01 LAB — CBC
HCT: 21.7 % — ABNORMAL LOW (ref 36.0–46.0)
Hemoglobin: 7.2 g/dL — ABNORMAL LOW (ref 12.0–15.0)
MCH: 33.8 pg (ref 26.0–34.0)
MCHC: 33.2 g/dL (ref 30.0–36.0)
MCV: 101.9 fL — ABNORMAL HIGH (ref 80.0–100.0)
Platelets: 175 10*3/uL (ref 150–400)
RBC: 2.13 MIL/uL — ABNORMAL LOW (ref 3.87–5.11)
RDW: 15.7 % — ABNORMAL HIGH (ref 11.5–15.5)
WBC: 7.6 10*3/uL (ref 4.0–10.5)
nRBC: 0 % (ref 0.0–0.2)

## 2023-07-01 LAB — BASIC METABOLIC PANEL
Anion gap: 7 (ref 5–15)
BUN: 20 mg/dL (ref 8–23)
CO2: 25 mmol/L (ref 22–32)
Calcium: 8.5 mg/dL — ABNORMAL LOW (ref 8.9–10.3)
Chloride: 104 mmol/L (ref 98–111)
Creatinine, Ser: 1.54 mg/dL — ABNORMAL HIGH (ref 0.44–1.00)
GFR, Estimated: 33 mL/min — ABNORMAL LOW (ref >60)
Glucose, Bld: 110 mg/dL — ABNORMAL HIGH (ref 70–99)
Potassium: 4.6 mmol/L (ref 3.5–5.1)
Sodium: 136 mmol/L (ref 135–145)

## 2023-07-01 LAB — HEMOGLOBIN AND HEMATOCRIT, BLOOD
HCT: 22.9 % — ABNORMAL LOW (ref 36.0–46.0)
Hemoglobin: 7.6 g/dL — ABNORMAL LOW (ref 12.0–15.0)

## 2023-07-01 NOTE — Progress Notes (Signed)
Mobility Specialist - Progress Note     07/01/23 1218  Mobility  Activity Ambulated independently in hallway;Ambulated with assistance in hallway  Level of Assistance Standby assist, set-up cues, supervision of patient - no hands on  Assistive Device Front wheel walker  Distance Ambulated (ft) 280 ft  Range of Motion/Exercises Active  Activity Response Tolerated well  Mobility Referral Yes  Mobility visit 1 Mobility   Pt resting in bed on RA upon entry. Pt very pleasant and agreeable to participate in ambulation. Pt had some brief buckling initially upon standing but recovered quickly. Pt STS and ambulates to hallway SBA with RW x2 laps. Pt took x1 seated rest break for 2 minutes. Pt returned to bed and left with needs in reach.   Johnathan Hausen Mobility Specialist 07/01/23, 12:32 PM

## 2023-07-01 NOTE — Progress Notes (Signed)
PROGRESS NOTE    Christine Burnett  ZOX:[id number] DOB: Jul 15, 1940 DOA: 06/28/2023 PCP: Danelle Berry, PA-C   Assessment & Plan:   Principal Problem:   GI bleed Active Problems:   Acute GI bleeding  Assessment and Plan:  GI bleed: bright red blood as per pt. Likely secondary to diverticular bleed.  S/p colonoscopy which showed diverticulosis in sigmoid, descending colon; blood in rectum, recto-sigmoid, descending & splenic flexure & also many polyps in the entire colon & non-bleeding internal hemorrhoids. Holding aspirin, brilinta. If continues to bleed, will order tagged RBC scan. Will continue to monitor H&H   Shingles: active. Continue on acyclovir. 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 Holding brilinta, aspirin   HLD: continue on statin    Depression: severity unknown. 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 Continue on B12 supplement. Folate was WNL in Dec 2024. Repeat H&H ordered. Will transfuse if Hb < 7.0   CKDIIIb: Cr is labile. Avoid nephrotoxic meds   Leukocytosis: resolved    T11 compression fracture: likely secondary to fall at home several weeks ago. PT/OT recs HH    Small subcapsular hematoma: incidental finding on CT abd/pelvis. Continue w/ supportive care    Hx of lung cancer: in remission      DVT prophylaxis: SCDs Code Status: full  Family Communication:  Disposition Plan: likely d/c back home   Level of care: Telemetry Medical  Status is: Inpatient Remains inpatient appropriate because: severity of illness    Consultants:  GI   Procedures:   Antimicrobials:    Subjective: Pt c/o malaise   Objective: Vitals:   06/29/23 1156 06/29/23 1544 06/30/23 0733 06/30/23 1524  BP: (!) 147/82 (!) 160/82 118/61 137/74  Pulse: 98 (!) 103 (!) 108 (!) 102  Resp: (!) 21 16 16 16   Temp:  98.7 F (37.1 C) 98.4 F (36.9 C) 98 F (36.7 C)  TempSrc:  Oral    SpO2:  100% 98% 93% 98%  Weight:      Height:       No intake or output data in the 24 hours ending 07/01/23 0827  Filed Weights   06/28/23 1110  Weight: 57.2 kg    Examination:  General exam: appears comfortable  Respiratory system: decreased breath sounds b/l  Cardiovascular system: S1/S2+. No rubs or clicks  Gastrointestinal system: abd is soft, NT, ND & normal bowel sounds  Central nervous system: alert & oriented. Moves all extremities  Psychiatry: Judgement and insight appears normal. Appropriate mood and affect    Data Reviewed: I have personally reviewed following labs and imaging studies  CBC: Recent Labs  Lab 06/28/23 1114 06/29/23 0622 06/29/23 1536 06/30/23 0357 07/01/23 0403  WBC 12.1* 7.0  --  10.0 7.6  HGB 11.5* 8.1* 7.6* 7.7* 7.2*  HCT 34.6* 25.7* 23.0* 23.0* 21.7*  MCV 100.3* 110.3*  --  104.5* 101.9*  PLT 283 188  --  175 175   Basic Metabolic Panel: Recent Labs  Lab 06/28/23 1114 06/29/23 0622 06/30/23 0357 07/01/23 0403  NA 138 140 137 136  K 4.3 4.3 4.3 4.6  CL 103 110 106 104  CO2 23 20* 23 25  GLUCOSE 126* 102* 114* 110*  BUN 40* 34* 25* 20  CREATININE 1.58* 1.37* 1.35* 1.54*  CALCIUM 9.4 8.7* 8.8* 8.5*   GFR: Estimated Creatinine Clearance: 25 mL/min (A) (by C-G formula based on SCr of  1.54 mg/dL (H)). Liver Function Tests: Recent Labs  Lab 06/28/23 1114  AST 18  ALT 16  ALKPHOS 117  BILITOT 0.7  PROT 7.7  ALBUMIN 3.6   No results for input(s): "LIPASE", "AMYLASE" in the last 168 hours. No results for input(s): "AMMONIA" in the last 168 hours. Coagulation Profile: No results for input(s): "INR", "PROTIME" in the last 168 hours. Cardiac Enzymes: No results for input(s): "CKTOTAL", "CKMB", "CKMBINDEX", "TROPONINI" in the last 168 hours. BNP (last 3 results) No results for input(s): "PROBNP" in the last 8760 hours. HbA1C: No results for input(s): "HGBA1C" in the last 72 hours. CBG: No results for input(s): "GLUCAP" in the  last 168 hours. Lipid Profile: No results for input(s): "CHOL", "HDL", "LDLCALC", "TRIG", "CHOLHDL", "LDLDIRECT" in the last 72 hours. Thyroid Function Tests: No results for input(s): "TSH", "T4TOTAL", "FREET4", "T3FREE", "THYROIDAB" in the last 72 hours. Anemia Panel: No results for input(s): "VITAMINB12", "FOLATE", "FERRITIN", "TIBC", "IRON", "RETICCTPCT" in the last 72 hours. Sepsis Labs: No results for input(s): "PROCALCITON", "LATICACIDVEN" in the last 168 hours.  No results found for this or any previous visit (from the past 240 hours).       Radiology Studies: No results found.       Scheduled Meds:  acyclovir  800 mg Oral QID   atorvastatin  40 mg Oral Daily   busPIRone  5 mg Oral BID   vitamin B-12  1,000 mcg Oral Daily   PARoxetine  40 mg Oral Daily   sodium chloride flush  3 mL Intravenous Q12H   Continuous Infusions:   LOS: 3 days       Charise Killian, MD [facility] Pager [phone]  If 7PM-7AM, please contact night-coverage [email or web address] 07/01/2023, 8:27 AM

## 2023-07-01 NOTE — Plan of Care (Signed)
  Problem: Education: Goal: Knowledge of discharge needs will improve Outcome: Progressing   Problem: Clinical Measurements: Goal: Postoperative complications will be avoided or minimized Outcome: Progressing   Problem: Respiratory: Goal: Will achieve and/or maintain a regular respiratory rate, without signs or symptoms of dyspnea Outcome: Progressing   Problem: Skin Integrity: Goal: Demonstration of wound healing without infection will improve Outcome: Progressing   Problem: Education: Goal: Knowledge of General Education information will improve Description: Including pain rating scale, medication(s)/side effects and non-pharmacologic comfort measures Outcome: Progressing   Problem: Health Behavior/Discharge Planning: Goal: Ability to manage health-related needs will improve Outcome: Progressing   Problem: Clinical Measurements: Goal: Ability to maintain clinical measurements within normal limits will improve Outcome: Progressing Goal: Will remain free from infection Outcome: Progressing Goal: Diagnostic test results will improve Outcome: Progressing Goal: Respiratory complications will improve Outcome: Progressing Goal: Cardiovascular complication will be avoided Outcome: Progressing   Problem: Activity: Goal: Risk for activity intolerance will decrease Outcome: Progressing   Problem: Nutrition: Goal: Adequate nutrition will be maintained Outcome: Progressing   Problem: Coping: Goal: Level of anxiety will decrease Outcome: Progressing   Problem: Elimination: Goal: Will not experience complications related to bowel motility Outcome: Progressing Goal: Will not experience complications related to urinary retention Outcome: Progressing   Problem: Pain Managment: Goal: General experience of comfort will improve and/or be controlled Outcome: Progressing   Problem: Safety: Goal: Ability to remain free from injury will improve Outcome: Progressing   Problem:  Skin Integrity: Goal: Risk for impaired skin integrity will decrease Outcome: Progressing

## 2023-07-01 NOTE — TOC Initial Note (Signed)
Transition of Care [facility]) - Initial/Assessment Note    Patient Details  Name: Christine Burnett MRN: [id number] Date of Birth: May 14, 1941  Transition of Care [facility]) CM/SW Contact:    Maree Krabbe, LCSW Phone Number: 07/01/2023, 12:55 PM  Clinical Narrative: SW spoke with pt and she is agreeable to [facility] and would liek to use [facility]. SW will reach out to [place] at [employer]. Pt states she already has a rolling walker at home. Pt states her daughter will transport her home at dc.                  Expected Discharge Plan: Home w Home Health Services Barriers to Discharge: Continued Medical Work up   Patient Goals and CMS Choice Patient states their goals for this hospitalization and ongoing recovery are:: get better   Choice offered to / list presented to : Adult Children      Expected Discharge Plan and Services In-house Referral: Clinical Social Work   Post Acute Care Choice: Home Health Living arrangements for the past 2 months: Single Family Home                           HH Arranged: PT, OT HH Agency: [facility] Date HH Agency Contacted: 07/01/23 Time HH Agency Contacted: 1253    Prior Living Arrangements/Services Living arrangements for the past 2 months: Single Family Home Lives with:: Adult Children Patient language and need for interpreter reviewed:: Yes Do you feel safe going back to the place where you live?: Yes      Need for Family Participation in Patient Care: Yes (Comment) Care giver support system in place?: Yes (comment) Current home services: DME Criminal Activity/Legal Involvement Pertinent to Current Situation/Hospitalization: No - Comment as needed  Activities of Daily Living   ADL Screening (condition at time of admission) Independently performs ADLs?: Yes (appropriate for developmental age) Is the patient deaf or have difficulty hearing?: No Does the patient have difficulty seeing, even when wearing  glasses/contacts?: No Does the patient have difficulty concentrating, remembering, or making decisions?: No  Permission Sought/Granted Permission sought to share information with : Family Supports Permission granted to share information with : Yes, Verbal Permission Granted  Share Information with NAME: [name]  Permission granted to share info w AGENCY: [facility]  Permission granted to share info w Relationship: daughter     Emotional Assessment Appearance:: Appears stated age Attitude/Demeanor/Rapport: Engaged Affect (typically observed): Accepting Orientation: : Oriented to Place, Oriented to Situation, Oriented to  Time, Oriented to Self Alcohol / Substance Use: Not Applicable Psych Involvement: No (comment)  Admission diagnosis:  GI bleed [K92.2] Acute GI bleeding [K92.2] Compression fracture of body of thoracic vertebra (HCC) [S22.000A] Patient Active Problem List   Diagnosis Date Noted   GI bleed 06/28/2023   Acute GI bleeding 06/28/2023   Innominate artery stenosis (HCC) 05/18/2023   PAD (peripheral artery disease) (HCC) 05/18/2023   Stenosis of right internal carotid artery 04/19/2023   Internal carotid artery stenosis, right 04/19/2023   Essential hypertension 04/18/2023   Dyslipidemia 04/18/2023   Anxiety and depression 04/18/2023   Chronic obstructive pulmonary disease (COPD) (HCC) 04/18/2023   TIA (transient ischemic attack) 04/17/2023   Inferior pubic ramus fracture, left, closed, initial encounter (HCC) 11/01/2022   Fall 11/01/2022   Closed fracture of left inferior pubic ramus, initial encounter (HCC) 11/01/2022   Sacral fracture, closed (HCC) 11/01/2022   Mild episode of recurrent major depressive  disorder (HCC) 08/14/2022   Dyspnea on exertion 05/15/2022   Coronary artery disease    COPD (chronic obstructive pulmonary disease) (HCC) 01/28/2021   CKD (chronic kidney disease) stage 4, GFR 15-29 ml/min (HCC) 01/21/2021   Goals of care, counseling/discussion  07/17/2020   Malignant neoplasm of lower lobe of right lung (HCC) 07/04/2020   Nocturnal hypoxia 03/17/2020   Prediabetes 08/13/2018   Chest pain 09/27/2017   Allergic rhinitis 09/27/2017   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   Stage 3b chronic kidney disease (HCC) 08/31/2017   Hyperparathyroidism, 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   Osteoporosis without current pathological fracture 03/30/2017   Cardiac murmur 04/07/2015   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 stage 4 chronic kidney disease, or unspecified chronic kidney disease 02/10/2015   Anxiety 01/05/2015   Hyperlipidemia 01/05/2015   HTN (hypertension) 09/24/2014   PCP:  Danelle Berry, PA-C Pharmacy:   [facility] [address] [place] - [address] [place] [address] Phone: [phone] Fax: [phone]     Social Drivers of Health (SDOH) Social History: SDOH Screenings   Food Insecurity: No Food Insecurity (06/29/2023)  Housing: Low Risk  (06/29/2023)  Transportation Needs: No Transportation Needs (06/29/2023)  Utilities: Not At Risk (06/29/2023)  Alcohol Screen: Low Risk  (02/08/2021)  Depression (PHQ2-9): Low Risk  (04/30/2023)  Recent Concern: Depression (PHQ2-9) - Medium Risk (04/03/2023)  Financial Resource Strain: Low Risk  (05/24/2023)  Physical Activity: Insufficiently Active (05/24/2023)  Social Connections: Moderately Integrated (06/29/2023)  Stress: No Stress Concern Present (05/24/2023)  Recent Concern: Stress - Stress Concern Present (04/02/2023)  Tobacco Use: Medium Risk (06/29/2023)   SDOH Interventions:     Readmission Risk Interventions     No data to display

## 2023-07-02 ENCOUNTER — Encounter: Payer: Self-pay | Admitting: Gastroenterology

## 2023-07-02 DIAGNOSIS — K635 Polyp of colon: Secondary | ICD-10-CM | POA: Diagnosis not present

## 2023-07-02 DIAGNOSIS — K5730 Diverticulosis of large intestine without perforation or abscess without bleeding: Secondary | ICD-10-CM | POA: Diagnosis not present

## 2023-07-02 DIAGNOSIS — K922 Gastrointestinal hemorrhage, unspecified: Secondary | ICD-10-CM | POA: Diagnosis not present

## 2023-07-02 LAB — CBC
HCT: 22.1 % — ABNORMAL LOW (ref 36.0–46.0)
Hemoglobin: 7.4 g/dL — ABNORMAL LOW (ref 12.0–15.0)
MCH: 34.4 pg — ABNORMAL HIGH (ref 26.0–34.0)
MCHC: 33.5 g/dL (ref 30.0–36.0)
MCV: 102.8 fL — ABNORMAL HIGH (ref 80.0–100.0)
Platelets: 170 10*3/uL (ref 150–400)
RBC: 2.15 MIL/uL — ABNORMAL LOW (ref 3.87–5.11)
RDW: 15.6 % — ABNORMAL HIGH (ref 11.5–15.5)
WBC: 8 10*3/uL (ref 4.0–10.5)
nRBC: 0 % (ref 0.0–0.2)

## 2023-07-02 LAB — BASIC METABOLIC PANEL
Anion gap: 10 (ref 5–15)
BUN: 17 mg/dL (ref 8–23)
CO2: 24 mmol/L (ref 22–32)
Calcium: 8.7 mg/dL — ABNORMAL LOW (ref 8.9–10.3)
Chloride: 101 mmol/L (ref 98–111)
Creatinine, Ser: 1.4 mg/dL — ABNORMAL HIGH (ref 0.44–1.00)
GFR, Estimated: 37 mL/min — ABNORMAL LOW (ref >60)
Glucose, Bld: 102 mg/dL — ABNORMAL HIGH (ref 70–99)
Potassium: 3.9 mmol/L (ref 3.5–5.1)
Sodium: 135 mmol/L (ref 135–145)

## 2023-07-02 MED ORDER — HYDROCOD POLI-CHLORPHE POLI ER 10-8 MG/5ML PO SUER
5.0000 mL | Freq: Two times a day (BID) | ORAL | Status: DC | PRN
  Administered 2023-07-02: 5 mL via ORAL
  Filled 2023-07-02: qty 5

## 2023-07-02 NOTE — Plan of Care (Signed)

## 2023-07-02 NOTE — Progress Notes (Signed)
PT Cancellation Note  Patient Details Name: Christine Burnett MRN: [id number] DOB: Oct 29, 1940   Cancelled Treatment:     Therapist in this am, pt c/o severe nausea and unable to tolerate PT. Returned after receiving nausea meds, pt continued to decline. Will re-attempt next available date/time per POC.    Jannet Askew 07/02/2023, 3:44 PM

## 2023-07-02 NOTE — Progress Notes (Signed)
    Christine Minium, MD [facility]   [address] [place], [place] [address] Phone: [phone] Fax : [phone]   Subjective: The patient has had no further GI bleeding.  She continues to do well and tolerating a p.o. diet.  The patient had a colonoscopy with diverticulosis as the cause of the bleeding.  She denies any abdominal pain nausea vomiting fevers or chills.   Objective: Vital signs in last 24 hours: Vitals:   07/01/23 0848 07/01/23 1557 07/01/23 2000 07/02/23 0823  BP: 116/72 (!) 137/96 125/70 (!) 163/86  Pulse: (!) 102 (!) 107 82 (!) 101  Resp: 16 16 16 19   Temp: 98.3 F (36.8 C)   97.8 F (36.6 C)  TempSrc:    Oral  SpO2: 95% 96%  94%  Weight:      Height:       Weight change:  No intake or output data in the 24 hours ending 07/02/23 0959   Exam: Heart:: Regular rate and rhythm or without murmur or extra heart sounds Lungs: normal and clear to auscultation and percussion Abdomen: soft, nontender, normal bowel sounds   Lab Results: @LABTEST2 @ Micro Results: No results found for this or any previous visit (from the past 240 hours). Studies/Results: No results found. Medications: I have reviewed the patient's current medications. Scheduled Meds:  acyclovir  800 mg Oral QID   atorvastatin  40 mg Oral Daily   busPIRone  5 mg Oral BID   vitamin B-12  1,000 mcg Oral Daily   PARoxetine  40 mg Oral Daily   sodium chloride flush  3 mL Intravenous Q12H   Continuous Infusions: PRN Meds:.acetaminophen **OR** acetaminophen, bisacodyl, morphine injection, ondansetron **OR** ondansetron (ZOFRAN) IV, oxyCODONE, simethicone   Assessment: Principal Problem:   GI bleed Active Problems:   Acute GI bleeding    Plan: This patient had a colonoscopy with multiple polyps seen throughout the colon which were small and not the cause of the patient's bleeding.  The bleeding was likely due to the diverticulosis seen in the left colon.  Diverticular bleed is very  unlikely to be managed effectively with a colonoscopy due to the bleeding being within the diverticuli and blood being mobile going from diverticuli to diverticuli not knowing which one is actually bleeding.  If the patient should have any further bleeding a red tagged cell scan versus CT angiography with IR versus vascular surgery doing embolization.  Nothing further to do from a GI point of view.  I will sign off.  Please call if any further GI concerns or questions.  We would like to thank you for the opportunity to participate in the care of [facility]    LOS: 4 days   Christine Minium, MD.FACG 07/02/2023, 9:59 AM Pager [phone] 7am-5pm  Check AMION for 5pm -7am coverage and on weekends

## 2023-07-02 NOTE — Care Management Important Message (Signed)
Important Message  Patient Details  Name: Christine Burnett MRN: [id number] Date of Birth: 1941/04/12   Important Message Given:  Yes - Medicare IM     Cristela Blue, CMA 07/02/2023, 10:29 AM

## 2023-07-02 NOTE — Progress Notes (Signed)
PROGRESS NOTE    Christine Burnett  ZOX:[id number] DOB: [birth year] DOA: 06/28/2023 PCP: Danelle Berry, PA-C   Assessment & Plan:   Principal Problem:   GI bleed Active Problems:   Acute GI bleeding  Assessment and Plan:  GI bleed: bright red blood as per pt. Likely secondary to diverticular bleed.  S/p colonoscopy which showed diverticulosis in sigmoid, descending colon; blood in rectum, recto-sigmoid, descending & splenic flexure & also many polyps in the entire colon & non-bleeding internal hemorrhoids. Holding brilinta, aspirin. Will continue to monitor H&H    Shingles: active. Continue on acyclovir. 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 Holding aspirin, brilinta   HLD: continue on statin    Depression: severity unknown. Continue on home dose of buspar, paxil    Macrocytic anemia: B12 was 410 05/2023. Continue on B12 supplement. Folate was WNL in Dec 2024. Will transfuse if Hb < 7.0.    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 Avoid nephrotoxic meds  Leukocytosis: resolved    T11 compression fracture: likely secondary to fall at home several weeks ago. PT/OT recs HH    Small subcapsular hematoma: incidental finding on CT abd/pelvis. Continue w/ supportive care    Hx of lung cancer: in remission      DVT prophylaxis: SCDs Code Status: full  Family Communication:  Disposition Plan: likely d/c back home tomorrow if H&H are stable and/or trending up   Level of care: Telemetry Medical  Status is: Inpatient Remains inpatient appropriate because: severity of illness    Consultants:  GI   Procedures:   Antimicrobials:    Subjective: Pt c/o fatigue   Objective: Vitals:   07/01/23 0848 07/01/23 1557 07/01/23 2000 07/02/23 0823  BP: 116/72 (!) 137/96 125/70 (!) 163/86  Pulse: (!) 102 (!) 107 82 (!) 101  Resp: 16 16 16 19   Temp: 98.3 F (36.8 C)   97.8 F (36.6 C)  TempSrc:    Oral  SpO2: 95% 96%  94%  Weight:      Height:        No intake or output data in the 24 hours ending 07/02/23 0832  Filed Weights   06/28/23 1110  Weight: 57.2 kg    Examination:  General exam: appears calm & comfortable  Respiratory system: diminished breath sounds b/l  Cardiovascular system: S1/S2+. No rubs or gallops  Gastrointestinal system: abd is soft, NT, ND & hypoactive bowel sounds Central nervous system: alert & oriented. Moves all extremities  Psychiatry: judgement and insight appears at baseline. Flat mood and affect    Data Reviewed: I have personally reviewed following labs and imaging studies  CBC: Recent Labs  Lab 06/28/23 1114 06/29/23 0622 06/29/23 1536 06/30/23 0357 07/01/23 0403 07/01/23 1506 07/02/23 0414  WBC 12.1* 7.0  --  10.0 7.6  --  8.0  HGB 11.5* 8.1* 7.6* 7.7* 7.2* 7.6* 7.4*  HCT 34.6* 25.7* 23.0* 23.0* 21.7* 22.9* 22.1*  MCV 100.3* 110.3*  --  104.5* 101.9*  --  102.8*  PLT 283 188  --  175 175  --  170   Basic Metabolic Panel: Recent Labs  Lab 06/28/23 1114 06/29/23 0622 06/30/23 0357 07/01/23 0403 07/02/23 0414  NA 138 140 137 136 135  K 4.3 4.3 4.3 4.6 3.9  CL 103 110 106 104 101  CO2 23 20* 23 25 24   GLUCOSE 126* 102* 114* 110* 102*  BUN 40* 34* 25* 20 17  CREATININE 1.58* 1.37* 1.35*  1.54* 1.40*  CALCIUM 9.4 8.7* 8.8* 8.5* 8.7*   GFR: Estimated Creatinine Clearance: 27.5 mL/min (A) (by C-G formula based on SCr of 1.4 mg/dL (H)). Liver Function Tests: Recent Labs  Lab 06/28/23 1114  AST 18  ALT 16  ALKPHOS 117  BILITOT 0.7  PROT 7.7  ALBUMIN 3.6   No results for input(s): "LIPASE", "AMYLASE" in the last 168 hours. No results for input(s): "AMMONIA" in the last 168 hours. Coagulation Profile: No results for input(s): "INR", "PROTIME" in the last 168 hours. Cardiac Enzymes: No results for input(s): "CKTOTAL", "CKMB", "CKMBINDEX", "TROPONINI" in the last 168 hours. BNP (last 3 results) No results for input(s): "PROBNP" in the last 8760 hours. HbA1C: No results  for input(s): "HGBA1C" in the last 72 hours. CBG: No results for input(s): "GLUCAP" in the last 168 hours. Lipid Profile: No results for input(s): "CHOL", "HDL", "LDLCALC", "TRIG", "CHOLHDL", "LDLDIRECT" in the last 72 hours. Thyroid Function Tests: No results for input(s): "TSH", "T4TOTAL", "FREET4", "T3FREE", "THYROIDAB" in the last 72 hours. Anemia Panel: No results for input(s): "VITAMINB12", "FOLATE", "FERRITIN", "TIBC", "IRON", "RETICCTPCT" in the last 72 hours. Sepsis Labs: No results for input(s): "PROCALCITON", "LATICACIDVEN" in the last 168 hours.  No results found for this or any previous visit (from the past 240 hours).       Radiology Studies: No results found.       Scheduled Meds:  acyclovir  800 mg Oral QID   atorvastatin  40 mg Oral Daily   busPIRone  5 mg Oral BID   vitamin B-12  1,000 mcg Oral Daily   PARoxetine  40 mg Oral Daily   sodium chloride flush  3 mL Intravenous Q12H   Continuous Infusions:   LOS: 4 days       Charise Killian, MD [facility] Pager [phone]  If 7PM-7AM, please contact night-coverage [email or web address] 07/02/2023, 8:32 AM

## 2023-07-03 ENCOUNTER — Inpatient Hospital Stay: Admission: RE | Admit: 2023-07-03 | Payer: Medicare Other | Source: Ambulatory Visit

## 2023-07-03 ENCOUNTER — Other Ambulatory Visit: Payer: Self-pay | Admitting: *Deleted

## 2023-07-03 DIAGNOSIS — K5791 Diverticulosis of intestine, part unspecified, without perforation or abscess with bleeding: Secondary | ICD-10-CM | POA: Diagnosis not present

## 2023-07-03 LAB — BASIC METABOLIC PANEL
Anion gap: 7 (ref 5–15)
BUN: 20 mg/dL (ref 8–23)
CO2: 26 mmol/L (ref 22–32)
Calcium: 8.6 mg/dL — ABNORMAL LOW (ref 8.9–10.3)
Chloride: 103 mmol/L (ref 98–111)
Creatinine, Ser: 1.7 mg/dL — ABNORMAL HIGH (ref 0.44–1.00)
GFR, Estimated: 30 mL/min — ABNORMAL LOW (ref >60)
Glucose, Bld: 110 mg/dL — ABNORMAL HIGH (ref 70–99)
Potassium: 4.4 mmol/L (ref 3.5–5.1)
Sodium: 136 mmol/L (ref 135–145)

## 2023-07-03 LAB — CBC
HCT: 21.8 % — ABNORMAL LOW (ref 36.0–46.0)
Hemoglobin: 7.3 g/dL — ABNORMAL LOW (ref 12.0–15.0)
MCH: 34.3 pg — ABNORMAL HIGH (ref 26.0–34.0)
MCHC: 33.5 g/dL (ref 30.0–36.0)
MCV: 102.3 fL — ABNORMAL HIGH (ref 80.0–100.0)
Platelets: 210 10*3/uL (ref 150–400)
RBC: 2.13 MIL/uL — ABNORMAL LOW (ref 3.87–5.11)
RDW: 15.9 % — ABNORMAL HIGH (ref 11.5–15.5)
WBC: 8.8 10*3/uL (ref 4.0–10.5)
nRBC: 0 % (ref 0.0–0.2)

## 2023-07-03 MED ORDER — CYANOCOBALAMIN 1000 MCG PO TABS: 1000.0000 ug | ORAL_TABLET | Freq: Every day | ORAL | 0 refills | Status: AC

## 2023-07-03 MED ORDER — OXYCODONE HCL 5 MG PO TABS: 5.0000 mg | ORAL_TABLET | Freq: Four times a day (QID) | ORAL | 0 refills | Status: AC | PRN

## 2023-07-03 NOTE — Consult Note (Signed)
[facility] Liaison Note  07/03/2023  Christine Burnett September 26, 1940 [id number]  Location: RN Hospital Liaison met patient at bedside at [facility].  Insurance: Medicare   Christine Burnett is a 83 y.o. female who is a Primary Care Patient of [facility]. The patient was screened for readmission hospitalization with noted high risk score for unplanned readmission risk with 1 IP/2 ED in 6 months.  The patient was assessed for potential Care Management service needs for post hospital transition for care coordination. Review of patient's electronic medical record reveals patient  was admitted for GI bleed. Pt on PPE isolation-liaison left PPE for immediate staff. Liaison spoke with pt at this nursing station and introduced [facility] for post hospital prevention readmission follow up call (pt receptive). Liaison will make a referral for [facility] care coordination services to intervene. Pt will discharged with [facility] arranged. Pt states she has two daughter to continue to assist her with her ADLs.  Plan: [facility] Liaison will continue to follow progress and disposition to asess for post hospital community care coordination/management needs.  Referral request for community care coordination: Will make a referral for care management services with [facility].    [facility] Care Management/Population Health does not replace or interfere with any arrangements made by the Inpatient Transition of Care team.   For questions contact:   Elliot Cousin, RN, [facility] Liaison [place]   [facility], Population Health Office Hours MTWF  8:00 am-6:00 pm Direct Dial: [phone] mobile [phone] [Office toll free line] Office Hours are M-F 8:30 - 5 pm [email or web address]

## 2023-07-03 NOTE — TOC Transition Note (Signed)
Transition of Care [facility]) - Discharge Note   Patient Details  Name: Christine Burnett MRN: [id number] Date of Birth: 08-09-1940  [facility] CM/SW Contact:  Garret Reddish, RN Phone Number: 07/03/2023, 3:14 PM   Clinical Narrative:    Chart reviewed.  Noted that patient has orders for discharge today.    I have made [place] with [facility] award that patient will be going home today.  [facility] will provide PT and OT services.    I have made staff nurse aware.     Final next level of care: Home w Home Health Services Barriers to Discharge: No Barriers Identified   Patient Goals and CMS Choice Patient states their goals for this hospitalization and ongoing recovery are:: get better CMS Medicare.gov Compare Post Acute Care list provided to:: Patient Choice offered to / list presented to : Patient      Discharge Placement                    Patient and family notified of of transfer: 07/03/23  Discharge Plan and Services Additional resources added to the After Visit Summary for   In-house Referral: Clinical Social Work   Post Acute Care Choice: Home Health                    HH Arranged: PT, OT [facility] Agency: [facility] Date HH Agency Contacted: 07/01/23 Time HH Agency Contacted: 1253 Representative spoke with at [facility] Agency: Adelina Mings  Social Drivers of Health (SDOH) Interventions SDOH Screenings   Food Insecurity: No Food Insecurity (06/29/2023)  Housing: Low Risk  (06/29/2023)  Transportation Needs: No Transportation Needs (06/29/2023)  Utilities: Not At Risk (06/29/2023)  Alcohol Screen: Low Risk  (02/08/2021)  Depression (PHQ2-9): Low Risk  (04/30/2023)  Recent Concern: Depression (PHQ2-9) - Medium Risk (04/03/2023)  Financial Resource Strain: Low Risk  (05/24/2023)  Physical Activity: Insufficiently Active (05/24/2023)  Social Connections: Moderately Integrated (06/29/2023)  Stress: No Stress Concern Present (05/24/2023)   Recent Concern: Stress - Stress Concern Present (04/02/2023)  Tobacco Use: Medium Risk (06/29/2023)     Readmission Risk Interventions     No data to display

## 2023-07-03 NOTE — Plan of Care (Signed)
  Problem: Clinical Measurements: Goal: Postoperative complications will be avoided or minimized Outcome: Progressing   Problem: Respiratory: Goal: Will achieve and/or maintain a regular respiratory rate, without signs or symptoms of dyspnea Outcome: Progressing   Problem: Skin Integrity: Goal: Demonstration of wound healing without infection will improve Outcome: Progressing

## 2023-07-03 NOTE — Plan of Care (Signed)

## 2023-07-03 NOTE — Plan of Care (Signed)
  Problem: Education: Goal: Knowledge of discharge needs will improve Outcome: Adequate for Discharge   Problem: Clinical Measurements: Goal: Postoperative complications will be avoided or minimized Outcome: Adequate for Discharge   Problem: Respiratory: Goal: Will achieve and/or maintain a regular respiratory rate, without signs or symptoms of dyspnea Outcome: Adequate for Discharge   Problem: Skin Integrity: Goal: Demonstration of wound healing without infection will improve Outcome: Adequate for Discharge   Problem: Education: Goal: Knowledge of General Education information will improve Description: Including pain rating scale, medication(s)/side effects and non-pharmacologic comfort measures 07/03/2023 1511 by Milderd Meager, RN Outcome: Adequate for Discharge 07/03/2023 1013 by Milderd Meager, RN Outcome: Progressing   Problem: Health Behavior/Discharge Planning: Goal: Ability to manage health-related needs will improve 07/03/2023 1511 by Milderd Meager, RN Outcome: Adequate for Discharge 07/03/2023 1013 by Milderd Meager, RN Outcome: Progressing   Problem: Clinical Measurements: Goal: Ability to maintain clinical measurements within normal limits will improve Outcome: Adequate for Discharge Goal: Will remain free from infection Outcome: Adequate for Discharge Goal: Diagnostic test results will improve Outcome: Adequate for Discharge Goal: Respiratory complications will improve Outcome: Adequate for Discharge Goal: Cardiovascular complication will be avoided Outcome: Adequate for Discharge   Problem: Activity: Goal: Risk for activity intolerance will decrease Outcome: Adequate for Discharge   Problem: Nutrition: Goal: Adequate nutrition will be maintained Outcome: Adequate for Discharge   Problem: Coping: Goal: Level of anxiety will decrease Outcome: Adequate for Discharge   Problem: Elimination: Goal: Will not experience complications related to bowel  motility Outcome: Adequate for Discharge Goal: Will not experience complications related to urinary retention Outcome: Adequate for Discharge   Problem: Pain Managment: Goal: General experience of comfort will improve and/or be controlled Outcome: Adequate for Discharge   Problem: Safety: Goal: Ability to remain free from injury will improve 07/03/2023 1511 by Milderd Meager, RN Outcome: Adequate for Discharge 07/03/2023 1013 by Milderd Meager, RN Outcome: Progressing   Problem: Skin Integrity: Goal: Risk for impaired skin integrity will decrease 07/03/2023 1511 by Milderd Meager, RN Outcome: Adequate for Discharge 07/03/2023 1013 by Milderd Meager, RN Outcome: Progressing

## 2023-07-03 NOTE — Progress Notes (Signed)
Mobility Specialist - Progress Note    07/03/23 1005  Mobility  Activity Transferred to/from BSC;Stood at bedside;Ambulated with assistance in room  Level of Assistance Modified independent, requires aide device or extra time  Assistive Device Other (Comment) (HHA)  Distance Ambulated (ft) 4 ft  Range of Motion/Exercises Active  Activity Response Tolerated well  Mobility Referral Yes  Mobility visit 1 Mobility   Pt resting in bed on RA upon entry. Pt STS and ambulates to room and transfers to [facility] ModI with HHA from Chartered loss adjuster. Pt declined further mobility at this time, asked for me come back. Pt returned to bed and left with needs in reach. RN present at bedside.   Johnathan Hausen Mobility Specialist 07/03/23, 10:09 AM

## 2023-07-03 NOTE — Discharge Instructions (Signed)
[facility] will provide Home Health services. [phone]

## 2023-07-03 NOTE — Discharge Summary (Signed)
Physician Discharge Summary  Christine Burnett XBM:[id number] DOB: [birth year] DOA: 06/28/2023  PCP: Danelle Berry, PA-C  Admit date: 06/28/2023 Discharge date: 07/03/2023  Admitted From: home  Disposition:  home w/ home health   Recommendations for Outpatient Follow-up:  Follow up with PCP w/in 1 week Need CBC to check H&H w/in 1 week   Home Health: yes  Equipment/Devices:   Discharge Condition: stable  CODE STATUS: full  Diet recommendation: Regular   Brief/Interim Summary: HPI was taken from Dr. Mayford Knife: 83 y/o F w/ of PMH CKDIII, COPD, depression, lung cancer (in remission), carotid stenosis, IDA, TIA who presented after 1 episode of bright red blood per rectum. Pt is on aspirin, brilinta for stent placed in carotid in Nov 2024. Pt denies ever having EGD in the past and pt had colonoscopy approx 5-6 years and the scope could not be advanced for some reason as per pt. Pt denies taking NSAIDs. Pt denies any fever, chills, sweating, chest pain, vomiting, dysuria, urinary urgency, urinary frequency, diarrhea or constipation. Pt c/o intermittent nausea & abd pain. Of note, pt was recently dx w/ shingles and started on acyclovir and has not been taking this medication appropriately b/c its 4 times a day and difficult for her to take. The abd pain is secondary to shingles rash located on her abd and back.   Discharge Diagnoses:  Principal Problem:   GI bleed Active Problems:   Acute GI bleeding  GI bleed: bright red blood as per pt. Likely secondary to diverticular bleed.  S/p colonoscopy which showed diverticulosis in sigmoid, descending colon; blood in rectum, recto-sigmoid, descending & splenic flexure & also many polyps in the entire colon & non-bleeding internal hemorrhoids. Restart brilinta, aspirin. Pt denies any more bloody stools in over 3 days. Will need get CBC w/in 1 week to check H&H outpatient, pt verbalized her understanding    Shingles: active. Completed acyclovir course.  Lesions not weeping currently, beginning to crust over.    Hx of carotid stenosis: s/p stent placed in Nov 2024. Holding aspirin, brilinta    HLD: continue on statin    Depression: severity unknown. Continue on home dose of buspar, paxil    Macrocytic anemia: B12 was 410 05/2023. Continue on B12 supplement. Folate was WNL in Dec 2024. Will transfuse if Hb < 7.0.    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 Avoid nephrotoxic meds   Leukocytosis: resolved    T11 compression fracture: likely secondary to fall at home several weeks ago. PT/OT recs HH    Small subcapsular hematoma: incidental finding on CT abd/pelvis. Continue w/ supportive care    Hx of lung cancer: in remission   Discharge Instructions  Discharge Instructions     Diet general   Complete by: As directed    Discharge instructions   Complete by: As directed    F/u w/ PCP within 1 week. Get CBC to get Hb & Hct checked within 1 week   Increase activity slowly   Complete by: As directed    No wound care   Complete by: As directed       Allergies as of 07/03/2023       Reactions   Zithromax [azithromycin]    Cefuroxime Axetil Rash   Ciprofloxacin Nausea Only   Unknown   Codeine Other (See Comments)   Patient does not like how the medication makes you feel         Medication List     STOP taking these medications  acyclovir 800 MG tablet Commonly known as: ZOVIRAX   methylPREDNISolone 4 MG Tbpk tablet Commonly known as: MEDROL DOSEPAK       TAKE these medications    albuterol 108 (90 Base) MCG/ACT inhaler Commonly known as: Ventolin HFA Inhale 2 puffs into the lungs every 4 (four) hours as needed for wheezing or shortness of breath.   albuterol (2.5 MG/3ML) 0.083% nebulizer solution Commonly known as: PROVENTIL Take 3 mLs (2.5 mg total) by nebulization every 4 (four) hours as needed for shortness of breath or wheezing.   aspirin EC 81 MG tablet Take 81 mg by mouth at bedtime. Swallow whole.    atorvastatin 40 MG tablet Commonly known as: LIPITOR Take 1 tablet (40 mg total) by mouth at bedtime.   busPIRone 5 MG tablet Commonly known as: BUSPAR Take 1 tablet (5 mg total) by mouth 2 (two) times daily as needed (stress anxiety).   Calcium 200 MG Tabs Take 300 mg by mouth every Monday, Tuesday, Wednesday, Thursday, and Friday.   calcium carbonate 500 MG chewable tablet Commonly known as: TUMS - dosed in mg elemental calcium Chew 1.5 tablets (300 mg of elemental calcium total) by mouth every Monday, Tuesday, Wednesday, Thursday, and Friday.   cyanocobalamin 1000 MCG tablet Take 1 tablet (1,000 mcg total) by mouth daily. Start taking on: July 04, 2023   ferrous sulfate 325 (65 FE) MG EC tablet Take 1 tablet (325 mg total) by mouth every other day.   fluticasone furoate-vilanterol 100-25 MCG/ACT Aepb Commonly known as: BREO ELLIPTA Inhale 1 puff into the lungs daily.   melatonin 5 MG Tabs Take 0.5 tablets (2.5 mg total) by mouth at bedtime. What changed:  how much to take Another medication with the same name was removed. Continue taking this medication, and follow the directions you see here.   multivitamin capsule Take 1 capsule by mouth daily.   oxyCODONE 5 MG immediate release tablet Commonly known as: Oxy IR/ROXICODONE Take 1 tablet (5 mg total) by mouth every 6 (six) hours as needed for up to 5 days for moderate pain (pain score 4-6) or severe pain (pain score 7-10).   PARoxetine 40 MG tablet Commonly known as: PAXIL Take 1 tablet (40 mg total) by mouth every morning.   senna-docusate 8.6-50 MG tablet Commonly known as: Senokot-S Take 1 tablet by mouth at bedtime as needed for moderate constipation.   ticagrelor 90 MG Tabs tablet Commonly known as: BRILINTA Take 1 tablet (90 mg total) by mouth 2 (two) times daily.   tizanidine 2 MG capsule Commonly known as: ZANAFLEX Take 1 capsule (2 mg total) by mouth 2 (two) times daily as needed for muscle  spasms.   Vitamin D3 50 MCG (2000 UT) Tabs Take 2,000 Units by mouth at bedtime.        Allergies  Allergen Reactions   Zithromax [Azithromycin]    Cefuroxime Axetil Rash   Ciprofloxacin Nausea Only    Unknown   Codeine Other (See Comments)    Patient does not like how the medication makes you feel     Consultations:    Procedures/Studies: CT L-SPINE NO CHARGE Result Date: 06/28/2023 CLINICAL DATA:  Rectal bleeding. Back pain after falling 3 weeks ago. On blood thinners. EXAM: CT Lumbar Spine without contrast TECHNIQUE: Technique: Multiplanar CT images of the lumbar spine were reconstructed from contemporary CT of the Abdomen and Pelvis. RADIATION DOSE REDUCTION: This exam was performed according to the departmental dose-optimization program which includes automated exposure control, adjustment  of the mA and/or kV according to patient size and/or use of iterative reconstruction technique. CONTRAST:  No additional COMPARISON:  CT of the chest, abdomen and pelvis 04/22/2023. Lumbar spine CT 10/31/2022. FINDINGS: Segmentation: There are 5 lumbar type vertebral bodies. Alignment: Normal. Vertebrae: Chronic superior endplate compression fracture at L1 is unchanged. There is a mild superior endplate compression deformity at T11 which is new from 04/22/2023 and appears acute. This is associated with 40% loss of vertebral body height and 3 mm of osseous retropulsion. No other acute fractures are identified. Fracture of the right 10th rib adjacent to the costovertebral junction is not acute. Paraspinal and other soft tissues: No acute paraspinal findings. Intra-abdominal findings dictated separately. Disc levels: Chronic spondylosis, greatest at L4-5 and L5-S1. No large disc herniation or high-grade spinal stenosis. There is mild left-sided foraminal narrowing at L4-5 and mild biforaminal narrowing at L5-S1 which appears chronic. IMPRESSION: 1. Acute superior endplate compression fracture at T11 with  40% loss of vertebral body height and 3 mm of osseous retropulsion. 2. No acute lumbar spine findings demonstrated. Chronic superior endplate compression fracture at L1. 3. Chronic spondylosis without high-grade spinal stenosis. 4. Abdominopelvic findings dictated separately. Electronically Signed   By: Carey Bullocks M.D.   On: 06/28/2023 13:37   CT ABDOMEN PELVIS W CONTRAST Result Date: 06/28/2023 CLINICAL DATA:  Abdominal pain, acute, nonlocalized. Rectal bleeding. History of lung cancer. Fall 3 weeks ago with back pain. EXAM: CT ABDOMEN AND PELVIS WITH CONTRAST TECHNIQUE: Multidetector CT imaging of the abdomen and pelvis was performed using the standard protocol following bolus administration of intravenous contrast. RADIATION DOSE REDUCTION: This exam was performed according to the departmental dose-optimization program which includes automated exposure control, adjustment of the mA and/or kV according to patient size and/or use of iterative reconstruction technique. CONTRAST:  75mL OMNIPAQUE IOHEXOL 300 MG/ML  SOLN COMPARISON:  04/22/2023 FINDINGS: Lower chest: Again noted is chronic consolidation in the right lower lobe. Consolidation at the right lung base may have increased compared to the previous chest CT. No acute abnormality at the left lung base. Hepatobiliary: Normal appearance of the liver, gallbladder and portal venous system. No significant biliary dilatation. Pancreas: Unremarkable. No pancreatic ductal dilatation or surrounding inflammatory changes. Spleen: Small subcapsular fluid collection around the superolateral aspect of the spleen. This small collection measures up to 7 mm in thickness. This small subcapsular fluid collection appears to be new since November 2024. Hounsfield units of the subcapsular fluid is approximately 38. No significant inflammation around the spleen. No significant parenchymal defect or infarct. Adrenals/Urinary Tract: Normal appearance of the adrenal glands.  Cortical scarring in both kidneys without hydronephrosis. Bilateral renal calcifications could represent nonobstructive stones versus vascular calcifications. Small hypodensity in left kidney upper pole is too small to definitively characterize. No suspicious renal lesions. Normal appearance of the urinary bladder. Stomach/Bowel: High-density material in the proximal sigmoid colon on image 54/to is indeterminate. This could represent GI bleeding but limited evaluation without non contrast images. Colonic diverticula scattered throughout the colon. No acute colonic inflammation. Normal appendix. No bowel dilatation or obstruction. Normal appearance of the stomach. Probable duodenal diverticulum near the ampulla. Vascular/Lymphatic: Extensive atherosclerotic disease in the abdominal aorta with infrarenal abdominal aortic aneurysm measures up to 3.7 cm. Extensive atherosclerotic disease involving bilateral iliac arteries. Concern for areas of stenosis involving the right common and right external iliac artery. Proximal femoral arteries are patent. Calcified plaque and stenosis in left common femoral artery. Atherosclerotic plaque involving the visceral arteries but  the celiac artery and SMA are patent. IMA is probably patent. Limited evaluation of the renal arteries. No significant lymph node enlargement in the abdomen or pelvis. Reproductive: Uterus and bilateral adnexa are unremarkable. Other: No free fluid. Negative for free air. Probable small left inguinal hernia containing fat. Negative for free air. Musculoskeletal: Old bilateral pubic rami fractures. Dynamic hip screw in the left hip. Joint space narrowing degenerative changes in the right hip. Old vertebral body compression fracture at L1. There is a new compression fracture involving the superior endplate of T11. Chronic disc and endplate changes in lower lumbar spine. IMPRESSION: 1. High-density material in the proximal sigmoid colon. This could represent  active GI bleeding but limited evaluation without non contrast comparison imaging. 2. New vertebral body compression fracture involving the superior endplate of T11. 3. Colonic diverticulosis without acute diverticulitis. 4. Small subcapsular splenic collection. The subcapsular fluid is slightly dense and probably represents a small subcapsular hematoma. No acute inflammatory changes involving the spleen and this could be a subacute hematoma. No significant splenic infarct. 5. Infrarenal abdominal aortic aneurysm measures up to 3.7 cm. Recommend follow-up ultrasound every 3 years. (Ref.: J Vasc Surg. 2018; 67:2-77 and J Am Coll Radiol 2013;10(10):789-794.) 6. Chronic consolidation in the right lower lobe. Consolidation at the right lung base may have slightly increased compared to the previous chest CT. 7. Aortic Atherosclerosis (ICD10-I70.0). These results were called by telephone at the time of interpretation on 06/28/2023 at 1:21 pm to provider NEHA RAY , who verbally acknowledged these results. Electronically Signed   By: Richarda Overlie M.D.   On: 06/28/2023 13:31   CT Head Wo Contrast Result Date: 06/11/2023 CLINICAL DATA:  Three falls within the last 2 weeks. Dizziness. Anticoagulated. EXAM: CT HEAD WITHOUT CONTRAST TECHNIQUE: Contiguous axial images were obtained from the base of the skull through the vertex without intravenous contrast. RADIATION DOSE REDUCTION: This exam was performed according to the departmental dose-optimization program which includes automated exposure control, adjustment of the mA and/or kV according to patient size and/or use of iterative reconstruction technique. COMPARISON:  04/18/2023 FINDINGS: Brain: Age related volume loss. Chronic small-vessel ischemic changes of the white matter. No sign of acute infarction, mass lesion, hemorrhage, hydrocephalus or extra-axial collection. Vascular: There is atherosclerotic calcification of the major vessels at the base of the brain. Skull:  Negative Sinuses/Orbits: No traumatic sinus fluid. Few opacified ethmoid air cells on the left. Orbits negative. Other: None IMPRESSION: No acute or traumatic finding. Age related volume loss. Chronic small-vessel ischemic changes of the white matter. Electronically Signed   By: Paulina Fusi M.D.   On: 06/11/2023 16:53   (Echo, Carotid, EGD, Colonoscopy, ERCP)    Subjective: Pt c/o feeling lonely    Discharge Exam: Vitals:   07/03/23 0003 07/03/23 0848  BP: (!) 101/58 118/71  Pulse:  (!) 105  Resp: 16 19  Temp: 98.2 F (36.8 C) 98.5 F (36.9 C)  SpO2: 97% 92%   Vitals:   07/02/23 0823 07/02/23 1521 07/03/23 0003 07/03/23 0848  BP: (!) 163/86 (!) 142/70 (!) 101/58 118/71  Pulse: (!) 101 (!) 106  (!) 105  Resp: 19 18 16 19   Temp: 97.8 F (36.6 C) 99.2 F (37.3 C) 98.2 F (36.8 C) 98.5 F (36.9 C)  TempSrc: Oral   Oral  SpO2: 94% 96% 97% 92%  Weight:      Height:        General: Pt is alert, awake, not in acute distress Cardiovascular: S1/S2 +,  no rubs, no gallops Respiratory: CTA bilaterally, no wheezing, no rhonchi Abdominal: Soft, NT, ND, bowel sounds + Extremities: no edema, no cyanosis    The results of significant diagnostics from this hospitalization (including imaging, microbiology, ancillary and laboratory) are listed below for reference.     Microbiology: No results found for this or any previous visit (from the past 240 hours).   Labs: BNP (last 3 results) No results for input(s): "BNP" in the last 8760 hours. Basic Metabolic Panel: Recent Labs  Lab 06/29/23 0622 06/30/23 0357 07/01/23 0403 07/02/23 0414 07/03/23 0430  NA 140 137 136 135 136  K 4.3 4.3 4.6 3.9 4.4  CL 110 106 104 101 103  CO2 20* 23 25 24 26   GLUCOSE 102* 114* 110* 102* 110*  BUN 34* 25* 20 17 20   CREATININE 1.37* 1.35* 1.54* 1.40* 1.70*  CALCIUM 8.7* 8.8* 8.5* 8.7* 8.6*   Liver Function Tests: Recent Labs  Lab 06/28/23 1114  AST 18  ALT 16  ALKPHOS 117  BILITOT 0.7   PROT 7.7  ALBUMIN 3.6   No results for input(s): "LIPASE", "AMYLASE" in the last 168 hours. No results for input(s): "AMMONIA" in the last 168 hours. CBC: Recent Labs  Lab 06/29/23 0622 06/29/23 1536 06/30/23 0357 07/01/23 0403 07/01/23 1506 07/02/23 0414 07/03/23 0430  WBC 7.0  --  10.0 7.6  --  8.0 8.8  HGB 8.1*   < > 7.7* 7.2* 7.6* 7.4* 7.3*  HCT 25.7*   < > 23.0* 21.7* 22.9* 22.1* 21.8*  MCV 110.3*  --  104.5* 101.9*  --  102.8* 102.3*  PLT 188  --  175 175  --  170 210   < > = values in this interval not displayed.   Cardiac Enzymes: No results for input(s): "CKTOTAL", "CKMB", "CKMBINDEX", "TROPONINI" in the last 168 hours. BNP: Invalid input(s): "POCBNP" CBG: No results for input(s): "GLUCAP" in the last 168 hours. D-Dimer No results for input(s): "DDIMER" in the last 72 hours. Hgb A1c No results for input(s): "HGBA1C" in the last 72 hours. Lipid Profile No results for input(s): "CHOL", "HDL", "LDLCALC", "TRIG", "CHOLHDL", "LDLDIRECT" in the last 72 hours. Thyroid function studies No results for input(s): "TSH", "T4TOTAL", "T3FREE", "THYROIDAB" in the last 72 hours.  Invalid input(s): "FREET3" Anemia work up No results for input(s): "VITAMINB12", "FOLATE", "FERRITIN", "TIBC", "IRON", "RETICCTPCT" in the last 72 hours. Urinalysis    Component Value Date/Time   COLORURINE YELLOW (A) 04/17/2023 2133   APPEARANCEUR CLEAR (A) 04/17/2023 2133   APPEARANCEUR Clear 01/28/2016 0841   LABSPEC 1.013 04/17/2023 2133   PHURINE 6.0 04/17/2023 2133   GLUCOSEU NEGATIVE 04/17/2023 2133   HGBUR NEGATIVE 04/17/2023 2133   BILIRUBINUR NEGATIVE 04/17/2023 2133   BILIRUBINUR neg 07/29/2019 1541   BILIRUBINUR Negative 01/28/2016 0841   KETONESUR NEGATIVE 04/17/2023 2133   PROTEINUR NEGATIVE 04/17/2023 2133   UROBILINOGEN 0.2 07/29/2019 1541   NITRITE NEGATIVE 04/17/2023 2133   LEUKOCYTESUR NEGATIVE 04/17/2023 2133   Sepsis Labs Recent Labs  Lab 06/30/23 0357  07/01/23 0403 07/02/23 0414 07/03/23 0430  WBC 10.0 7.6 8.0 8.8   Microbiology No results found for this or any previous visit (from the past 240 hours).   Time coordinating discharge: Over 30 minutes  SIGNED:   Charise Killian, MD  [facility] 07/03/2023, 2:15 PM Pager   If 7PM-7AM, please contact night-coverage [email or web address]

## 2023-07-04 ENCOUNTER — Telehealth: Payer: Self-pay | Admitting: *Deleted

## 2023-07-04 NOTE — Transitions of Care (Post Inpatient/ED Visit) (Signed)
07/04/2023  Name: Christine Burnett MRN: [id number] DOB: Jan 17, 1941  Today's TOC FU Call Status: Today's TOC FU Call Status:: Successful TOC FU Call Completed TOC FU Call Complete Date: 07/04/23 Patient's Name and Date of Birth confirmed.  Transition Care Management Follow-up Telephone Call Date of Discharge: 07/03/23 Discharge Facility: [facility]) Type of Discharge: Inpatient Admission Primary Inpatient Discharge Diagnosis:: GI Bleed How have you been since you were released from the hospital?: Better Any questions or concerns?: Yes Patient Questions/Concerns:: Patient did not understand any of  appointments. She agreed that RN could call and talk with daughter [name] Patient Questions/Concerns Addressed: Other: (RN went over all information wiuth daughter [name])  Items Reviewed: Did you receive and understand the discharge instructions provided?: No (RN spoke with [name] and went over D/cd intructions) Medications obtained,verified, and reconciled?: Yes (Medications Reviewed) Any new allergies since your discharge?: No Dietary orders reviewed?: No Do you have support at home?: Yes People in Home: alone Name of Support/Comfort Primary Source: [name]  Medications Reviewed Today: Medications Reviewed Today     Reviewed by Luella Cook, RN (Case Manager) on 07/04/23 at 1113  Med List Status: <None>   Medication Order Taking? Sig Documenting Provider Last Dose Status Informant  albuterol (PROVENTIL) (2.5 MG/3ML) 0.083% nebulizer solution [id number] No Take 3 mLs (2.5 mg total) by nebulization every 4 (four) hours as needed for shortness of breath or wheezing.  Patient not taking: Reported on 04/24/2023   Sunnie Nielsen, DO Not Taking Active Self           Med Note Dollene Primrose   Thu Jun 21, 2023  3:22 PM)    albuterol (VENTOLIN HFA) 108 [phone] Base) MCG/ACT inhaler [id number] Yes Inhale 2 puffs into the lungs every 4 (four) hours as needed for  wheezing or shortness of breath. Mecum, Oswaldo Conroy, PA-C Taking Active Self           Med Note Sharia Reeve   Thu Jun 28, 2023  7:20 PM) prn  aspirin 81 MG EC tablet [id number] Yes Take 81 mg by mouth at bedtime. Swallow whole. [provider] Taking Active Self  atorvastatin (LIPITOR) 40 MG tablet [id number] Yes Take 1 tablet (40 mg total) by mouth at bedtime. Mecum, Oswaldo Conroy, PA-C Taking Active Self  busPIRone (BUSPAR) 5 MG tablet [id number] No Take 1 tablet (5 mg total) by mouth 2 (two) times daily as needed (stress anxiety).  Patient not taking: Reported on 04/24/2023   Mecum, Oswaldo Conroy, PA-C Not Taking Active Self           Med Note Dollene Primrose   Thu Jun 21, 2023  3:23 PM)    Calcium 200 MG TABS [id number] Yes Take 300 mg by mouth every Monday, Tuesday, Wednesday, Thursday, and Friday. [provider] Taking Active Self           Med Note Dollene Primrose   Thu May 24, 2023  2:46 PM)    calcium carbonate (TUMS - DOSED IN MG ELEMENTAL CALCIUM) 500 MG chewable tablet [id number] Yes Chew 1.5 tablets (300 mg of elemental calcium total) by mouth every Monday, Tuesday, Wednesday, Thursday, and Friday. Sunnie Nielsen, DO Taking Active Self  Cholecalciferol (VITAMIN D3) 50 MCG (2000 UT) TABS [id number] Yes Take 2,000 Units by mouth at bedtime. [provider] Taking Active Self  cyanocobalamin 1000 MCG tablet [id number] Yes Take 1 tablet (1,000 mcg total) by mouth daily. Charise Killian, MD Taking Active  ferrous sulfate 325 (65 FE) MG EC tablet [id number] Yes Take 1 tablet (325 mg total) by mouth every other day. Wouk, Wilfred Curtis, MD Taking Active Self  fluticasone furoate-vilanterol (BREO ELLIPTA) 100-25 MCG/ACT AEPB [id number] Yes Inhale 1 puff into the lungs daily. Danelle Berry, PA-C Taking Active Self  melatonin 5 MG TABS [id number] Yes Take 0.5 tablets (2.5 mg total) by mouth at bedtime.  Patient taking differently: Take 10 mg by mouth at bedtime.   Sunnie Nielsen, DO Taking Active Self           Med Note Dollene Primrose   Thu Jun 21, 2023  3:23 PM)    Multiple Vitamin (MULTIVITAMIN) capsule [id number] Yes Take 1 capsule by mouth daily. [provider] Taking Active Self           Med Note Dollene Primrose   Thu May 24, 2023  2:46 PM)    oxyCODONE (OXY IR/ROXICODONE) 5 MG immediate release tablet [id number] Yes Take 1 tablet (5 mg total) by mouth every 6 (six) hours as needed for up to 5 days for moderate pain (pain score 4-6) or severe pain (pain score 7-10). Charise Killian, MD Taking Active   PARoxetine (PAXIL) 40 MG tablet [id number] Yes Take 1 tablet (40 mg total) by mouth every morning. Mecum, Erin E, PA-C Taking Active Self  senna-docusate (SENOKOT-S) 8.6-50 MG tablet [id number] Yes Take 1 tablet by mouth at bedtime as needed for moderate constipation. Wouk, Wilfred Curtis, MD Taking Active Self           Med Note Lawerance Sabal Jun 28, 2023  7:29 PM) prn  ticagrelor (BRILINTA) 90 MG TABS tablet [id number] Yes Take 1 tablet (90 mg total) by mouth 2 (two) times daily. Wouk, Wilfred Curtis, MD Taking Active Self  tizanidine (ZANAFLEX) 2 MG capsule [id number] Yes Take 1 capsule (2 mg total) by mouth 2 (two) times daily as needed for muscle spasms. Margaretann Loveless, PA-C Taking Active Self           Med Note Maisie Fus, Jolly Mango   Thu Jun 28, 2023  7:32 PM) prn            Home Care and Equipment/Supplies: Were Home Health Services Ordered?: Yes Name of Home Health Agency:: [facility] Has Agency set up a time to come to your home?: No EMR reviewed for Home Health Orders: Orders present/patient has not received call (refer to CM for follow-up) (RN gave Eulis Canner phone number to call and follow up when they will be coming) Any new equipment or medical supplies ordered?: NA  Functional Questionnaire: Do you need assistance with bathing/showering or dressing?: Yes Do you need assistance with meal preparation?:  Yes Do you need assistance with eating?: No Do you have difficulty maintaining continence: No Do you need assistance with getting out of bed/getting out of a chair/moving?: Yes Do you have difficulty managing or taking your medications?: Yes  Follow up appointments reviewed: PCP Follow-up appointment confirmed?: Yes Date of PCP follow-up appointment?: 07/09/23 Follow-up Provider: Danelle Berry [id number] Specialist Hospital Follow-up appointment confirmed?: Yes Date of Specialist follow-up appointment?: 07/12/23 Follow-Up Specialty Provider:: Dr Claudine Mouton [id number] Dr Cherylann Ratel Do you need transportation to your follow-up appointment?: No Do you understand care options if your condition(s) worsen?: Yes-patient verbalized understanding  SDOH Interventions Today    Flowsheet Row Most Recent Value  SDOH Interventions   Food Insecurity Interventions Intervention Not Indicated  [Family help preparing meals]  Housing Interventions  Intervention Not Indicated  Transportation Interventions Intervention Not Indicated, Patient Resources (Friends/Family)  Utilities Interventions Intervention Not Indicated      Interventions Today    Flowsheet Row Most Recent Value  Chronic Disease   Chronic disease during today's visit Other  [GI Bleed]  General Interventions   General Interventions Discussed/Reviewed General Interventions Discussed, General Interventions Reviewed, Doctor Visits  [RN gave Eulis Canner number to follow  up on PT/OT]  Doctor Visits Discussed/Reviewed Doctor Visits Discussed, Doctor Visits Reviewed, PCP, Specialist  [RN spoke with [name] daughter and gave all appt times and dates]  PCP/Specialist Visits Compliance with follow-up visit  Exercise Interventions   Exercise Discussed/Reviewed Exercise Discussed  [Patient is to receive PT/OT thru [facility]]  Pharmacy Interventions   Pharmacy Dicussed/Reviewed Pharmacy Topics Discussed, Pharmacy Topics Reviewed       Patient  consented to RN to speak with daughter Dani Gobble Caliah Kopke BSN RN [facility] Care Management Coordinator [email or web address] Direct Dial: [phone]  Fax: [phone] Website: [email or web address]

## 2023-07-05 NOTE — Progress Notes (Deleted)
 Patient ID: Christine Burnett, female   DOB: August 03, 1940, 83 y.o.   MRN: [id number]  Chief Complaint:  ***  History of Present Illness Christine Burnett is a 83 y.o. female with ***.  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   Managed by nephrologist   Lower GI bleed 2025   Non-small cell lung cancer (NSCLC) (HCC)    Osteoporosis    Oxygen  deficiency Me   Risk for falls    Situational disturbance    Stroke (HCC) 2011   tia's x 2   TIA (transient ischemic attack)       Past Surgical History:  Procedure Laterality Date   CAROTID PTA/STENT INTERVENTION Right 04/20/2023   Procedure: CAROTID PTA/STENT INTERVENTION;  Surgeon: Marea Selinda RAMAN, MD;  Location: [facility] INVASIVE CV [facility];  Service: Cardiovascular;  Laterality: Right;  Inominate Artery Stenosis requiring stenting   COLONOSCOPY  2016   COLONOSCOPY WITH PROPOFOL  N/A 06/29/2023   Procedure: COLONOSCOPY WITH PROPOFOL ;  Surgeon: Jinny Carmine, MD;  Location: [facility] ENDOSCOPY;  Service: Endoscopy;  Laterality: N/A;   ESOPHAGUS SURGERY     INTRAMEDULLARY (IM) NAIL INTERTROCHANTERIC Left 03/26/2017   Procedure: INTRAMEDULLARY (IM) NAIL INTERTROCHANTRIC;  Surgeon: Kathlynn Sharper, MD;  Location: [facility] ORS;  Service: Orthopedics;  Laterality: Left;   LEFT HEART CATH AND CORONARY ANGIOGRAPHY N/A 04/15/2021   Procedure: LEFT HEART CATH AND CORONARY ANGIOGRAPHY;  Surgeon: Darron Deatrice LABOR, MD;  Location: [facility] CV [facility];  Service: Cardiovascular;   Laterality: N/A;   RIGHT/LEFT HEART CATH AND CORONARY ANGIOGRAPHY Bilateral 05/15/2022   Procedure: RIGHT/LEFT HEART CATH AND CORONARY ANGIOGRAPHY;  Surgeon: Darron Deatrice LABOR, MD;  Location: [facility];  Service: Cardiovascular;  Laterality: Bilateral;   VIDEO BRONCHOSCOPY WITH ENDOBRONCHIAL NAVIGATION Right 06/28/2020   Procedure: VIDEO BRONCHOSCOPY WITH ENDOBRONCHIAL NAVIGATION CELLVIZIO;  Surgeon: Tamea Dedra CROME, MD;  Location: [facility] ORS;  Service: Pulmonary;  Laterality: Right;    Allergies  Allergen Reactions   Zithromax  [Azithromycin ]    Cefuroxime Axetil Rash   Ciprofloxacin  Nausea Only    Unknown   Codeine Other (See Comments)    Patient does not like how the medication makes you feel     Current Outpatient Medications  Medication Sig Dispense Refill   albuterol  (PROVENTIL ) (2.5 MG/3ML) 0.083% nebulizer solution Take 3 mLs (2.5 mg total) by nebulization every 4 (four) hours as needed for shortness of breath or wheezing. (Patient not taking: Reported on 04/24/2023)     albuterol  (VENTOLIN  HFA) 108 (90 Base) MCG/ACT inhaler Inhale 2 puffs into the lungs every 4 (four) hours as needed for wheezing or shortness of breath. 8 g 1   aspirin  81 MG EC tablet Take 81 mg by mouth at bedtime. Swallow whole.     atorvastatin  (LIPITOR) 40 MG tablet Take 1 tablet (40 mg total) by mouth at bedtime. 90 tablet 1   busPIRone  (BUSPAR ) 5 MG tablet Take 1 tablet (5 mg  total) by mouth 2 (two) times daily as needed (stress anxiety). (Patient not taking: Reported on 04/24/2023) 60 tablet 1   Calcium  200 MG TABS Take 300 mg by mouth every Monday, Tuesday, Wednesday, Thursday, and Friday.     calcium  carbonate (TUMS - DOSED IN MG ELEMENTAL CALCIUM ) 500 MG chewable tablet Chew 1.5 tablets (300 mg of elemental calcium  total) by mouth every Monday, Tuesday, Wednesday, Thursday, and Friday.     Cholecalciferol  (VITAMIN D3) 50 MCG (2000 UT) TABS Take 2,000 Units by mouth at bedtime.      cyanocobalamin  1000 MCG tablet Take 1 tablet (1,000 mcg total) by mouth daily. 30 tablet 0   ferrous sulfate  325 (65 FE) MG EC tablet Take 1 tablet (325 mg total) by mouth every other day. 60 tablet 0   fluticasone  furoate-vilanterol (BREO ELLIPTA ) 100-25 MCG/ACT AEPB Inhale 1 puff into the lungs daily. 1 each 11   melatonin 5 MG TABS Take 0.5 tablets (2.5 mg total) by mouth at bedtime. (Patient taking differently: Take 10 mg by mouth at bedtime.)     Multiple Vitamin (MULTIVITAMIN) capsule Take 1 capsule by mouth daily.     oxyCODONE  (OXY IR/ROXICODONE ) 5 MG immediate release tablet Take 1 tablet (5 mg total) by mouth every 6 (six) hours as needed for up to 5 days for moderate pain (pain score 4-6) or severe pain (pain score 7-10). 20 tablet 0   PARoxetine  (PAXIL ) 40 MG tablet Take 1 tablet (40 mg total) by mouth every morning. 90 tablet 2   senna-docusate (SENOKOT-S) 8.6-50 MG tablet Take 1 tablet by mouth at bedtime as needed for moderate constipation. 30 tablet 1   ticagrelor  (BRILINTA ) 90 MG TABS tablet Take 1 tablet (90 mg total) by mouth 2 (two) times daily. 60 tablet 1   tizanidine  (ZANAFLEX ) 2 MG capsule Take 1 capsule (2 mg total) by mouth 2 (two) times daily as needed for muscle spasms. 10 capsule 0   No current facility-administered medications for this visit.    Family History Family History  Problem Relation Age of Onset   Other Mother        [place] posioning   Kidney disease Mother    Heart attack Father    Prostate cancer Father    Cancer Father    Heart disease Father    Heart attack Brother    Diabetes Brother    Hypertension Daughter    Kidney cancer Neg Hx    Bladder Cancer Neg Hx    Colon cancer Neg Hx    Colon polyps Neg Hx    Rectal cancer Neg Hx       Social History Social History   Tobacco Use   Smoking status: Former    Current packs/day: 0.00    Average packs/day: 1 pack/day for 61.0 years (61.0 ttl pk-yrs)    Types: Cigarettes    Start date:  03/25/1956    Quit date: 03/25/2017    Years since quitting: 6.2   Smokeless tobacco: Never  Vaping Use   Vaping status: Never Used  Substance Use Topics   Alcohol use: No   Drug use: No        ROS   Physical Exam There were no vitals taken for this visit.   CONSTITUTIONAL: Well developed, and nourished, appropriately responsive and aware without distress. ***  EYES: Sclera non-icteric.   EARS, NOSE, MOUTH AND THROAT: Mask worn.  *** The oropharynx is clear. Oral mucosa is pink and moist.  Dentition: ***  Hearing is intact to voice.  NECK: Trachea is midline, and there is no jugular venous distension.  LYMPH NODES:  Lymph nodes in the neck are not appreciated. RESPIRATORY:  Lungs are clear, and breath sounds are equal bilaterally. *** Normal respiratory effort without pathologic use of accessory muscles. CARDIOVASCULAR: Heart is regular in rate and rhythm.  *** Well perfused.  GI: The abdomen is *** soft, nontender, and nondistended. There were no palpable masses. *** I did not appreciate hepatosplenomegaly. There were normal bowel sounds.  *** GU: *** MUSCULOSKELETAL:  Symmetrical muscle tone appreciated in all four extremities.    SKIN: Skin turgor is normal. No pathologic skin lesions appreciated.  NEUROLOGIC:  Motor and sensation appear grossly normal.  Cranial nerves are grossly without defect. PSYCH:  Alert and oriented to person, place and time. Affect is appropriate for situation.  Data Reviewed I have personally reviewed what is currently available of the patient's imaging, recent labs and medical records.   Labs:     Latest Ref Rng & Units 07/03/2023    4:30 AM 07/02/2023    4:14 AM 07/01/2023    3:06 PM  CBC  WBC 4.0 - 10.5 K/uL 8.8  8.0    Hemoglobin 12.0 - 15.0 g/dL 7.3  7.4  7.6   Hematocrit 36.0 - 46.0 % 21.8  22.1  22.9   Platelets 150 - 400 K/uL 210  170        Latest Ref Rng & Units 07/03/2023    4:30 AM 07/02/2023    4:14 AM 07/01/2023    4:03 AM   CMP  Glucose 70 - 99 mg/dL 889  897  889   BUN 8 - 23 mg/dL 20  17  20    Creatinine 0.44 - 1.00 mg/dL 8.29  8.59  8.45   Sodium 135 - 145 mmol/L 136  135  136   Potassium 3.5 - 5.1 mmol/L 4.4  3.9  4.6   Chloride 98 - 111 mmol/L 103  101  104   CO2 22 - 32 mmol/L 26  24  25    Calcium  8.9 - 10.3 mg/dL 8.6  8.7  8.5    *** {Labs :18171}  Imaging: Radiological images reviewed:  CLINICAL DATA:  Abdominal pain, acute, nonlocalized. Rectal bleeding. History of lung cancer. Fall 3 weeks ago with back pain.   EXAM: CT ABDOMEN AND PELVIS WITH CONTRAST   TECHNIQUE: Multidetector CT imaging of the abdomen and pelvis was performed using the standard protocol following bolus administration of intravenous contrast.   RADIATION DOSE REDUCTION: This exam was performed according to the departmental dose-optimization program which includes automated exposure control, adjustment of the mA and/or kV according to patient size and/or use of iterative reconstruction technique.   CONTRAST:  75mL OMNIPAQUE  IOHEXOL  300 MG/ML  SOLN   COMPARISON:  04/22/2023   FINDINGS: Lower chest: Again noted is chronic consolidation in the right lower lobe. Consolidation at the right lung base may have increased compared to the previous chest CT. No acute abnormality at the left lung base.   Hepatobiliary: Normal appearance of the liver, gallbladder and portal venous system. No significant biliary dilatation.   Pancreas: Unremarkable. No pancreatic ductal dilatation or surrounding inflammatory changes.   Spleen: Small subcapsular fluid collection around the superolateral aspect of the spleen. This small collection measures up to 7 mm in thickness. This small subcapsular fluid collection appears to be new since November 2024. Hounsfield units of the subcapsular fluid is approximately 38. No significant  inflammation around the spleen. No significant parenchymal defect or infarct.   Adrenals/Urinary  Tract: Normal appearance of the adrenal glands. Cortical scarring in both kidneys without hydronephrosis. Bilateral renal calcifications could represent nonobstructive stones versus vascular calcifications. Small hypodensity in left kidney upper pole is too small to definitively characterize. No suspicious renal lesions. Normal appearance of the urinary bladder.   Stomach/Bowel: High-density material in the proximal sigmoid colon on image 54/to is indeterminate. This could represent GI bleeding but limited evaluation without non contrast images. Colonic diverticula scattered throughout the colon. No acute colonic inflammation. Normal appendix. No bowel dilatation or obstruction. Normal appearance of the stomach. Probable duodenal diverticulum near the ampulla.   Vascular/Lymphatic: Extensive atherosclerotic disease in the abdominal aorta with infrarenal abdominal aortic aneurysm measures up to 3.7 cm. Extensive atherosclerotic disease involving bilateral iliac arteries. Concern for areas of stenosis involving the right common and right external iliac artery. Proximal femoral arteries are patent. Calcified plaque and stenosis in left common femoral artery. Atherosclerotic plaque involving the visceral arteries but the celiac artery and SMA are patent. IMA is probably patent. Limited evaluation of the renal arteries. No significant lymph node enlargement in the abdomen or pelvis.   Reproductive: Uterus and bilateral adnexa are unremarkable.   Other: No free fluid. Negative for free air. Probable small left inguinal hernia containing fat. Negative for free air.   Musculoskeletal: Old bilateral pubic rami fractures. Dynamic hip screw in the left hip. Joint space narrowing degenerative changes in the right hip. Old vertebral body compression fracture at L1. There is a new compression fracture involving the superior endplate of T11. Chronic disc and endplate changes in lower lumbar  spine.   IMPRESSION: 1. High-density material in the proximal sigmoid colon. This could represent active GI bleeding but limited evaluation without non contrast comparison imaging. 2. New vertebral body compression fracture involving the superior endplate of T11. 3. Colonic diverticulosis without acute diverticulitis. 4. Small subcapsular splenic collection. The subcapsular fluid is slightly dense and probably represents a small subcapsular hematoma. No acute inflammatory changes involving the spleen and this could be a subacute hematoma. No significant splenic infarct. 5. Infrarenal abdominal aortic aneurysm measures up to 3.7 cm. Recommend follow-up ultrasound every 3 years. (Ref.: J Vasc Surg. 2018; 67:2-77 and J Am Coll Radiol 2013;10(10):789-794.) 6. Chronic consolidation in the right lower lobe. Consolidation at the right lung base may have slightly increased compared to the previous chest CT. 7. Aortic Atherosclerosis (ICD10-I70.0).   These results were called by telephone at the time of interpretation on 06/28/2023 at 1:21 pm to provider NEHA RAY , who verbally acknowledged these results.     Electronically Signed   By: Juliene Balder M.D.   On: 06/28/2023 13:31 Within last 24 hrs: No results found.  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   PAD (peripheral artery disease) (HCC) 05/18/2023   Stenosis of right internal carotid artery 04/19/2023   Internal carotid artery stenosis, right 04/19/2023   Essential hypertension 04/18/2023   Dyslipidemia 04/18/2023   Anxiety and depression 04/18/2023   Chronic obstructive pulmonary disease (COPD) (HCC) 04/18/2023   TIA (transient ischemic attack) 04/17/2023   Inferior pubic ramus fracture, left, closed, initial encounter (HCC) 11/01/2022   Fall 11/01/2022   Closed fracture of left inferior pubic  ramus, initial encounter (HCC) 11/01/2022   Sacral fracture, closed (  HCC) 11/01/2022   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   Dyspnea on exertion 05/15/2022   Coronary artery disease    COPD (chronic obstructive pulmonary disease) (HCC) 01/28/2021   CKD (chronic kidney disease) stage 4, GFR 15-29 ml/min (HCC) 01/21/2021   Goals of care, counseling/discussion 07/17/2020   Malignant neoplasm of lower lobe of right lung (HCC) 07/04/2020   Nocturnal hypoxia 03/17/2020   Prediabetes 08/13/2018   Chest pain 09/27/2017   Allergic rhinitis 09/27/2017   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   Stage 3b chronic kidney disease (HCC) 08/31/2017   Hyperparathyroidism, 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    These notes generated with voice recognition software. I apologize for typographical errors.  Honor Leghorn M.D., FACS 07/05/2023, 1:53 PM

## 2023-07-08 DIAGNOSIS — F330 Major depressive disorder, recurrent, mild: Secondary | ICD-10-CM | POA: Diagnosis not present

## 2023-07-08 DIAGNOSIS — I129 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 stage 4 chronic kidney disease, or unspecified chronic kidney disease: Secondary | ICD-10-CM | POA: Diagnosis not present

## 2023-07-08 DIAGNOSIS — I2510 Atherosclerotic heart disease of native coronary artery without angina pectoris: Secondary | ICD-10-CM | POA: Diagnosis not present

## 2023-07-08 DIAGNOSIS — I739 Peripheral vascular disease, unspecified: Secondary | ICD-10-CM | POA: Diagnosis not present

## 2023-07-08 DIAGNOSIS — N2581 Secondary hyperparathyroidism of renal origin: Secondary | ICD-10-CM | POA: Diagnosis not present

## 2023-07-08 DIAGNOSIS — B029 Zoster without complications: Secondary | ICD-10-CM | POA: Diagnosis not present

## 2023-07-08 DIAGNOSIS — Z8673 Personal history of transient ischemic attack (TIA), and cerebral infarction without residual deficits: Secondary | ICD-10-CM | POA: Diagnosis not present

## 2023-07-08 DIAGNOSIS — D539 Nutritional anemia, unspecified: Secondary | ICD-10-CM | POA: Diagnosis not present

## 2023-07-08 DIAGNOSIS — J449 Chronic obstructive pulmonary disease, unspecified: Secondary | ICD-10-CM | POA: Diagnosis not present

## 2023-07-08 DIAGNOSIS — N184 Chronic kidney disease, stage 4 (severe): Secondary | ICD-10-CM | POA: Diagnosis not present

## 2023-07-08 DIAGNOSIS — M8008XD Age-related osteoporosis with current pathological fracture, vertebra(e), subsequent encounter for fracture with routine healing: Secondary | ICD-10-CM | POA: Diagnosis not present

## 2023-07-08 DIAGNOSIS — E785 Hyperlipidemia, unspecified: Secondary | ICD-10-CM | POA: Diagnosis not present

## 2023-07-08 DIAGNOSIS — Z7951 Long term (current) use of inhaled steroids: Secondary | ICD-10-CM | POA: Diagnosis not present

## 2023-07-08 DIAGNOSIS — F419 Anxiety disorder, unspecified: Secondary | ICD-10-CM | POA: Diagnosis not present

## 2023-07-08 DIAGNOSIS — Z7982 Long term (current) use of aspirin: Secondary | ICD-10-CM | POA: Diagnosis not present

## 2023-07-08 DIAGNOSIS — K5730 Diverticulosis of large intestine without perforation or abscess without bleeding: Secondary | ICD-10-CM | POA: Diagnosis not present

## 2023-07-08 DIAGNOSIS — K922 Gastrointestinal hemorrhage, unspecified: Secondary | ICD-10-CM | POA: Diagnosis not present

## 2023-07-08 DIAGNOSIS — Z9181 History of falling: Secondary | ICD-10-CM | POA: Diagnosis not present

## 2023-07-08 DIAGNOSIS — Z85118 Personal history of other malignant neoplasm of bronchus and lung: Secondary | ICD-10-CM | POA: Diagnosis not present

## 2023-07-09 ENCOUNTER — Encounter: Payer: Self-pay | Admitting: Family Medicine

## 2023-07-09 ENCOUNTER — Ambulatory Visit: Payer: Self-pay

## 2023-07-09 ENCOUNTER — Ambulatory Visit: Payer: Medicare Other | Admitting: Family Medicine

## 2023-07-09 VITALS — BP 116/68 | HR 83 | Resp 16 | Ht 66.0 in | Wt 120.0 lb

## 2023-07-09 DIAGNOSIS — Z09 Encounter for follow-up examination after completed treatment for conditions other than malignant neoplasm: Secondary | ICD-10-CM | POA: Diagnosis not present

## 2023-07-09 DIAGNOSIS — D509 Iron deficiency anemia, unspecified: Secondary | ICD-10-CM | POA: Diagnosis not present

## 2023-07-09 DIAGNOSIS — K625 Hemorrhage of anus and rectum: Secondary | ICD-10-CM | POA: Diagnosis not present

## 2023-07-09 DIAGNOSIS — K922 Gastrointestinal hemorrhage, unspecified: Secondary | ICD-10-CM

## 2023-07-09 LAB — CBC WITH DIFFERENTIAL/PLATELET
Absolute Lymphocytes: 1670 {cells}/uL (ref 850–3900)
Absolute Monocytes: 570 {cells}/uL (ref 200–950)
Basophils Absolute: 60 {cells}/uL (ref 0–200)
Basophils Relative: 0.6 %
Eosinophils Absolute: 200 {cells}/uL (ref 15–500)
Eosinophils Relative: 2 %
HCT: 26.4 % — ABNORMAL LOW (ref 35.0–45.0)
Hemoglobin: 8.3 g/dL — ABNORMAL LOW (ref 11.7–15.5)
MCH: 32.3 pg (ref 27.0–33.0)
MCHC: 31.4 g/dL — ABNORMAL LOW (ref 32.0–36.0)
MCV: 102.7 fL — ABNORMAL HIGH (ref 80.0–100.0)
MPV: 10.9 fL (ref 7.5–12.5)
Monocytes Relative: 5.7 %
Neutro Abs: 7500 {cells}/uL (ref 1500–7800)
Neutrophils Relative %: 75 %
Platelets: 391 10*3/uL (ref 140–400)
RBC: 2.57 10*6/uL — ABNORMAL LOW (ref 3.80–5.10)
RDW: 14 % (ref 11.0–15.0)
Total Lymphocyte: 16.7 %
WBC: 10 10*3/uL (ref 3.8–10.8)

## 2023-07-09 NOTE — Progress Notes (Unsigned)
Name: Christine Burnett   MRN: [id number]    DOB: 1940-07-31   Date:07/09/2023       Progress Note  Chief Complaint  Patient presents with   Hospitalization Follow-up    Admitted from 06/28/23-07/03/23   Fatigue    Has been in bed x2 weeks, no energy and crying     Subjective:   Christine Burnett is a 83 y.o. female, presents to clinic for routine follow up on chronic conditions  Here for hospital follow up/transition of care.  Admit date: 06/28/2023  Discharge date: 07/03/2023 Transition of care was initiated previously by *** on *** and med changes, diagnosis, specialist follow ups and pts symptoms and condition were all reviewed.  Pt was admitted for acute GI bleed  - no bleeding since d/c New medications started per hospitalization include no new meds Labs due today are - repeat CBC Pt feels tired, but no abd pain, she notes one episode at home of maroon looking stool, Stool loose   Hemoglobin  Date Value Ref Range Status  07/03/2023 7.3 (L) 12.0 - 15.0 g/dL Final  40/98/1191 7.4 (L) 12.0 - 15.0 g/dL Final  47/82/9562 7.6 (L) 12.0 - 15.0 g/dL Final  13/01/6577 7.2 (L) 12.0 - 15.0 g/dL Final  46/96/2952 84.1 11.1 - 15.9 g/dL Final  32/44/0102 72.5 11.1 - 15.9 g/dL Final   HGB  Date Value Ref Range Status  07/03/2014 14.2 12.0 - 16.0 g/dL Final  36/64/4034 74.2 12.0 - 16.0 g/dL Final   Shingles completed acyclovir Dx on 1/16 Rash is all scabbed over and almost resolved      Current Outpatient Medications:    albuterol (VENTOLIN HFA) 108 (90 Base) MCG/ACT inhaler, Inhale 2 puffs into the lungs every 4 (four) hours as needed for wheezing or shortness of breath., Disp: 8 g, Rfl: 1   aspirin 81 MG EC tablet, Take 81 mg by mouth at bedtime. Swallow whole., Disp: , Rfl:    atorvastatin (LIPITOR) 40 MG tablet, Take 1 tablet (40 mg total) by mouth at bedtime., Disp: 90 tablet, Rfl: 1   busPIRone (BUSPAR) 5 MG tablet, Take 1 tablet (5 mg total) by mouth 2 (two) times  daily as needed (stress anxiety)., Disp: 60 tablet, Rfl: 1   calcium carbonate (TUMS - DOSED IN MG ELEMENTAL CALCIUM) 500 MG chewable tablet, Chew 1.5 tablets (300 mg of elemental calcium total) by mouth every Monday, Tuesday, Wednesday, Thursday, and Friday., Disp: , Rfl:    Cholecalciferol (VITAMIN D3) 50 MCG (2000 UT) TABS, Take 2,000 Units by mouth at bedtime., Disp: , Rfl:    cyanocobalamin 1000 MCG tablet, Take 1 tablet (1,000 mcg total) by mouth daily., Disp: 30 tablet, Rfl: 0   ferrous sulfate 325 (65 FE) MG EC tablet, Take 1 tablet (325 mg total) by mouth every other day., Disp: 60 tablet, Rfl: 0   fluticasone furoate-vilanterol (BREO ELLIPTA) 100-25 MCG/ACT AEPB, Inhale 1 puff into the lungs daily., Disp: 1 each, Rfl: 11   Melatonin 10 MG TABS, Take 10 mg by mouth at bedtime., Disp: , Rfl:    Multiple Vitamin (MULTIVITAMIN) capsule, Take 1 capsule by mouth daily., Disp: , Rfl:    PARoxetine (PAXIL) 40 MG tablet, Take 1 tablet (40 mg total) by mouth every morning., Disp: 90 tablet, Rfl: 2   ticagrelor (BRILINTA) 90 MG TABS tablet, Take 1 tablet (90 mg total) by mouth 2 (two) times daily., Disp: 60 tablet, Rfl: 1   albuterol (PROVENTIL) (2.5 MG/3ML) 0.083%  nebulizer solution, Take 3 mLs (2.5 mg total) by nebulization every 4 (four) hours as needed for shortness of breath or wheezing. (Patient not taking: Reported on 04/24/2023), Disp: , Rfl:    Calcium 200 MG TABS, Take 300 mg by mouth every Monday, Tuesday, Wednesday, Thursday, and Friday. (Patient not taking: Reported on 07/09/2023), Disp: , Rfl:    senna-docusate (SENOKOT-S) 8.6-50 MG tablet, Take 1 tablet by mouth at bedtime as needed for moderate constipation. (Patient not taking: Reported on 07/09/2023), Disp: 30 tablet, Rfl: 1   tizanidine (ZANAFLEX) 2 MG capsule, Take 1 capsule (2 mg total) by mouth 2 (two) times daily as needed for muscle spasms. 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   PAD (peripheral artery disease) (HCC) 05/18/2023   Stenosis of right internal carotid artery 04/19/2023   Internal carotid artery stenosis, right 04/19/2023   Essential hypertension 04/18/2023   Dyslipidemia 04/18/2023   Anxiety and depression 04/18/2023   Chronic obstructive pulmonary disease (COPD) (HCC) 04/18/2023   TIA (transient ischemic attack) 04/17/2023   Inferior pubic ramus fracture, left, closed, initial encounter (HCC) 11/01/2022   Fall 11/01/2022   Closed fracture of left inferior pubic ramus, initial encounter (HCC) 11/01/2022   Sacral fracture, closed (HCC) 11/01/2022   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   Dyspnea on exertion 05/15/2022   Coronary artery disease    COPD (chronic obstructive pulmonary disease) (HCC) 01/28/2021   CKD (chronic kidney disease) stage 4, GFR 15-29 ml/min (HCC) 01/21/2021   Goals of care, counseling/discussion 07/17/2020   Malignant neoplasm of lower lobe of right lung (HCC) 07/04/2020   Nocturnal hypoxia 03/17/2020   Prediabetes 08/13/2018   Chest pain 09/27/2017   Allergic rhinitis 09/27/2017   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   Stage 3b chronic kidney disease (HCC) 08/31/2017   Hyperparathyroidism, 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   Osteoporosis without current pathological fracture 03/30/2017   Cardiac murmur 04/07/2015   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 stage 4 chronic kidney disease, or unspecified chronic kidney disease 02/10/2015   Anxiety 01/05/2015   Hyperlipidemia 01/05/2015   HTN (hypertension) 09/24/2014    Past Surgical History:  Procedure Laterality Date   CAROTID PTA/STENT INTERVENTION Right 04/20/2023   Procedure: CAROTID PTA/STENT INTERVENTION;  Surgeon: Annice Needy, MD;  Location: [facility] INVASIVE CV LAB;  Service:  Cardiovascular;  Laterality: Right;  Inominate Artery Stenosis requiring stenting   COLONOSCOPY  2016   COLONOSCOPY WITH PROPOFOL N/A 06/29/2023   Procedure: COLONOSCOPY WITH PROPOFOL;  Surgeon: Midge Minium, MD;  Location: [facility] ENDOSCOPY;  Service: Endoscopy;  Laterality: N/A;   ESOPHAGUS SURGERY     INTRAMEDULLARY (IM) NAIL INTERTROCHANTERIC Left 03/26/2017   Procedure: INTRAMEDULLARY (IM) NAIL INTERTROCHANTRIC;  Surgeon: Kennedy Bucker, MD;  Location: [facility] ORS;  Service: Orthopedics;  Laterality: Left;   LEFT HEART CATH AND CORONARY ANGIOGRAPHY N/A 04/15/2021   Procedure: LEFT HEART CATH AND CORONARY ANGIOGRAPHY;  Surgeon: Iran Ouch, MD;  Location: [facility] INVASIVE CV LAB;  Service: Cardiovascular;  Laterality: N/A;   RIGHT/LEFT HEART CATH AND CORONARY ANGIOGRAPHY Bilateral 05/15/2022   Procedure: RIGHT/LEFT HEART CATH AND CORONARY ANGIOGRAPHY;  Surgeon: Iran Ouch, MD;  Location: [facility] INVASIVE CV LAB;  Service: Cardiovascular;  Laterality: Bilateral;   VIDEO BRONCHOSCOPY WITH ENDOBRONCHIAL NAVIGATION Right 06/28/2020  Procedure: VIDEO BRONCHOSCOPY WITH ENDOBRONCHIAL NAVIGATION CELLVIZIO;  Surgeon: Salena Saner, MD;  Location: [facility] ORS;  Service: Pulmonary;  Laterality: Right;    Family History  Problem Relation Age of Onset   Other Mother        [place] posioning   Kidney disease Mother    Heart attack Father    Prostate cancer Father    Cancer Father    Heart disease Father    Heart attack Brother    Diabetes Brother    Hypertension Daughter    Kidney cancer Neg Hx    Bladder Cancer Neg Hx    Colon cancer Neg Hx    Colon polyps Neg Hx    Rectal cancer Neg Hx     Social History   Tobacco Use   Smoking status: Former    Current packs/day: 0.00    Average packs/day: 1 pack/day for 61.0 years (61.0 ttl pk-yrs)    Types: Cigarettes    Start date: 03/25/1956    Quit date: 03/25/2017    Years since quitting: 6.2   Smokeless tobacco: Never  Vaping Use    Vaping status: Never Used  Substance Use Topics   Alcohol use: No   Drug use: No     Allergies  Allergen Reactions   Zithromax [Azithromycin]    Cefuroxime Axetil Rash   Ciprofloxacin Nausea Only    Unknown   Codeine Other (See Comments)    Patient does not like how the medication makes you feel     Health Maintenance  Topic Date Due   Medicare Annual Wellness (AWV)  02/08/2022   COVID-19 Vaccine (1) 07/25/2023 (Originally 02/24/1946)   Zoster Vaccines- Shingrix (1 of 2) 10/06/2023 (Originally 02/25/1960)   DEXA SCAN  10/25/2024   DTaP/Tdap/Td (2 - Td or Tdap) 05/30/2027   Pneumonia Vaccine 5+ Years old  Completed   INFLUENZA VACCINE  Completed   HPV VACCINES  Aged Out   Hepatitis C Screening  Discontinued    Chart Review Today: ***  Review of Systems   Objective:   Vitals:   07/09/23 1430  BP: 116/68  Pulse: 83  Resp: 16  SpO2: 96%  Weight: 120 lb (54.4 kg)  Height: 5\' 6"  (1.676 m)    Body mass index is 19.37 kg/m.  Physical Exam   Functional Status Survey:   Results for orders placed or performed during the hospital encounter of 06/28/23  Comprehensive metabolic panel   Collection Time: 06/28/23 11:14 AM  Result Value Ref Range   Sodium 138 135 - 145 mmol/L   Potassium 4.3 3.5 - 5.1 mmol/L   Chloride 103 98 - 111 mmol/L   CO2 23 22 - 32 mmol/L   Glucose, Bld 126 (H) 70 - 99 mg/dL   BUN 40 (H) 8 - 23 mg/dL   Creatinine, Ser 6.57 (H) 0.44 - 1.00 mg/dL   Calcium 9.4 8.9 - 84.6 mg/dL   Total Protein 7.7 6.5 - 8.1 g/dL   Albumin 3.6 3.5 - 5.0 g/dL   AST 18 15 - 41 U/L   ALT 16 0 - 44 U/L   Alkaline Phosphatase 117 38 - 126 U/L   Total Bilirubin 0.7 0.0 - 1.2 mg/dL   GFR, Estimated 32 (L) >60 mL/min   Anion gap 12 5 - 15  CBC   Collection Time: 06/28/23 11:14 AM  Result Value Ref Range   WBC 12.1 (H) 4.0 - 10.5 K/uL   RBC 3.45 (L) 3.87 - 5.11 MIL/uL  Hemoglobin 11.5 (L) 12.0 - 15.0 g/dL   HCT 91.4 (L) 78.2 - 95.6 %   MCV 100.3 (H) 80.0 -  100.0 fL   MCH 33.3 26.0 - 34.0 pg   MCHC 33.2 30.0 - 36.0 g/dL   RDW 21.3 08.6 - 57.8 %   Platelets 283 150 - 400 K/uL   nRBC 0.0 0.0 - 0.2 %  Type and screen [facility]   Collection Time: 06/28/23 11:14 AM  Result Value Ref Range   ABO/RH(D) O POS    Antibody Screen NEG    Sample Expiration      07/01/2023,2359 Performed at [facility], [address] [place], [place] [address]   CBC   Collection Time: 06/29/23  6:22 AM  Result Value Ref Range   WBC 7.0 4.0 - 10.5 K/uL   RBC 2.33 (L) 3.87 - 5.11 MIL/uL   Hemoglobin 8.1 (L) 12.0 - 15.0 g/dL   HCT 95.2 (L) 84.1 - 32.4 %   MCV 110.3 (H) 80.0 - 100.0 fL   MCH 34.8 (H) 26.0 - 34.0 pg   MCHC 31.5 30.0 - 36.0 g/dL   RDW 40.1 02.7 - 25.3 %   Platelets 188 150 - 400 K/uL   nRBC 0.0 0.0 - 0.2 %  Basic metabolic panel   Collection Time: 06/29/23  6:22 AM  Result Value Ref Range   Sodium 140 135 - 145 mmol/L   Potassium 4.3 3.5 - 5.1 mmol/L   Chloride 110 98 - 111 mmol/L   CO2 20 (L) 22 - 32 mmol/L   Glucose, Bld 102 (H) 70 - 99 mg/dL   BUN 34 (H) 8 - 23 mg/dL   Creatinine, Ser 6.64 (H) 0.44 - 1.00 mg/dL   Calcium 8.7 (L) 8.9 - 10.3 mg/dL   GFR, Estimated 38 (L) >60 mL/min   Anion gap 10 5 - 15  Hemoglobin and hematocrit, blood   Collection Time: 06/29/23  3:36 PM  Result Value Ref Range   Hemoglobin 7.6 (L) 12.0 - 15.0 g/dL   HCT 40.3 (L) 47.4 - 25.9 %  CBC   Collection Time: 06/30/23  3:57 AM  Result Value Ref Range   WBC 10.0 4.0 - 10.5 K/uL   RBC 2.20 (L) 3.87 - 5.11 MIL/uL   Hemoglobin 7.7 (L) 12.0 - 15.0 g/dL   HCT 56.3 (L) 87.5 - 64.3 %   MCV 104.5 (H) 80.0 - 100.0 fL   MCH 35.0 (H) 26.0 - 34.0 pg   MCHC 33.5 30.0 - 36.0 g/dL   RDW 32.9 (H) 51.8 - 84.1 %   Platelets 175 150 - 400 K/uL   nRBC 0.0 0.0 - 0.2 %  Basic metabolic panel   Collection Time: 06/30/23  3:57 AM  Result Value Ref Range   Sodium 137 135 - 145 mmol/L   Potassium 4.3 3.5 - 5.1 mmol/L   Chloride 106 98 -  111 mmol/L   CO2 23 22 - 32 mmol/L   Glucose, Bld 114 (H) 70 - 99 mg/dL   BUN 25 (H) 8 - 23 mg/dL   Creatinine, Ser 6.60 (H) 0.44 - 1.00 mg/dL   Calcium 8.8 (L) 8.9 - 10.3 mg/dL   GFR, Estimated 39 (L) >60 mL/min   Anion gap 8 5 - 15  CBC   Collection Time: 07/01/23  4:03 AM  Result Value Ref Range   WBC 7.6 4.0 - 10.5 K/uL   RBC 2.13 (L) 3.87 - 5.11 MIL/uL  Hemoglobin 7.2 (L) 12.0 - 15.0 g/dL   HCT 87.5 (L) 64.3 - 32.9 %   MCV 101.9 (H) 80.0 - 100.0 fL   MCH 33.8 26.0 - 34.0 pg   MCHC 33.2 30.0 - 36.0 g/dL   RDW 51.8 (H) 84.1 - 66.0 %   Platelets 175 150 - 400 K/uL   nRBC 0.0 0.0 - 0.2 %  Basic metabolic panel   Collection Time: 07/01/23  4:03 AM  Result Value Ref Range   Sodium 136 135 - 145 mmol/L   Potassium 4.6 3.5 - 5.1 mmol/L   Chloride 104 98 - 111 mmol/L   CO2 25 22 - 32 mmol/L   Glucose, Bld 110 (H) 70 - 99 mg/dL   BUN 20 8 - 23 mg/dL   Creatinine, Ser 6.30 (H) 0.44 - 1.00 mg/dL   Calcium 8.5 (L) 8.9 - 10.3 mg/dL   GFR, Estimated 33 (L) >60 mL/min   Anion gap 7 5 - 15  Hemoglobin and hematocrit, blood   Collection Time: 07/01/23  3:06 PM  Result Value Ref Range   Hemoglobin 7.6 (L) 12.0 - 15.0 g/dL   HCT 16.0 (L) 10.9 - 32.3 %  CBC   Collection Time: 07/02/23  4:14 AM  Result Value Ref Range   WBC 8.0 4.0 - 10.5 K/uL   RBC 2.15 (L) 3.87 - 5.11 MIL/uL   Hemoglobin 7.4 (L) 12.0 - 15.0 g/dL   HCT 55.7 (L) 32.2 - 02.5 %   MCV 102.8 (H) 80.0 - 100.0 fL   MCH 34.4 (H) 26.0 - 34.0 pg   MCHC 33.5 30.0 - 36.0 g/dL   RDW 42.7 (H) 06.2 - 37.6 %   Platelets 170 150 - 400 K/uL   nRBC 0.0 0.0 - 0.2 %  Basic metabolic panel   Collection Time: 07/02/23  4:14 AM  Result Value Ref Range   Sodium 135 135 - 145 mmol/L   Potassium 3.9 3.5 - 5.1 mmol/L   Chloride 101 98 - 111 mmol/L   CO2 24 22 - 32 mmol/L   Glucose, Bld 102 (H) 70 - 99 mg/dL   BUN 17 8 - 23 mg/dL   Creatinine, Ser 2.83 (H) 0.44 - 1.00 mg/dL   Calcium 8.7 (L) 8.9 - 10.3 mg/dL   GFR, Estimated 37  (L) >60 mL/min   Anion gap 10 5 - 15  CBC   Collection Time: 07/03/23  4:30 AM  Result Value Ref Range   WBC 8.8 4.0 - 10.5 K/uL   RBC 2.13 (L) 3.87 - 5.11 MIL/uL   Hemoglobin 7.3 (L) 12.0 - 15.0 g/dL   HCT 15.1 (L) 76.1 - 60.7 %   MCV 102.3 (H) 80.0 - 100.0 fL   MCH 34.3 (H) 26.0 - 34.0 pg   MCHC 33.5 30.0 - 36.0 g/dL   RDW 37.1 (H) 06.2 - 69.4 %   Platelets 210 150 - 400 K/uL   nRBC 0.0 0.0 - 0.2 %  Basic metabolic panel   Collection Time: 07/03/23  4:30 AM  Result Value Ref Range   Sodium 136 135 - 145 mmol/L   Potassium 4.4 3.5 - 5.1 mmol/L   Chloride 103 98 - 111 mmol/L   CO2 26 22 - 32 mmol/L   Glucose, Bld 110 (H) 70 - 99 mg/dL   BUN 20 8 - 23 mg/dL   Creatinine, Ser 8.54 (H) 0.44 - 1.00 mg/dL   Calcium 8.6 (L) 8.9 - 10.3 mg/dL   GFR, Estimated 30 (  L) >60 mL/min   Anion gap 7 5 - 15      Assessment & Plan:   Hospital discharge follow-up  BRBPR (bright red blood per rectum)  Iron deficiency anemia, unspecified iron deficiency anemia type    Hgb 7.3, around 11 prior to hospitalization   Lab Results  Component Value Date   IRON 89 05/24/2023   TIBC 341 05/24/2023   FERRITIN 44 05/24/2023      No follow-ups on file.   Danelle Berry, PA-C 07/09/23 3:00 PM

## 2023-07-09 NOTE — Telephone Encounter (Signed)
  Chief Complaint: Pregnant granddaughter and shingles exposure Symptoms: none Frequency:  Pertinent Negatives: Patient denies  Disposition: [] ED /[] Urgent Care (no appt availability in office) / [] Appointment(In office/virtual)/ []  [place] [facility]/ [] Home Care/ [] Refused Recommended Disposition /[] [place] [facility]/ [x]  Follow-up with PCP Additional Notes: Returned call to pt's daughter. Pt's granddaughter will be visiting for [place] and pt has recently had shingles. Pt's daughter wants to make sure that the granddaughter and the baby she is carrying will be ok if she is around the pt.  Please advise.  Pt's daughter would like a call back.   Summary: Post Shingles?   The daughter of the patient called in stating her mother recently had shingles but got a clean bill of health. She was on medication but is doing well now. She is concerned and just wants to make sure her daughter who is visiting from [place] and is pregnant through IVF is safe to be around her at this point. She says it has been a while since they have seen each other and there will be hugs so she just wants to be safe. Please assist further.       Reason for Disposition  Shingles, questions about  Answer Assessment - Initial Assessment Questions 1. APPEARANCE of RASH: "Describe the rash."      Resolved  Protocols used: Shingles (Zoster)-A-AH

## 2023-07-10 ENCOUNTER — Telehealth: Payer: Self-pay | Admitting: *Deleted

## 2023-07-10 ENCOUNTER — Telehealth (INDEPENDENT_AMBULATORY_CARE_PROVIDER_SITE_OTHER): Payer: Self-pay

## 2023-07-10 ENCOUNTER — Telehealth: Payer: Self-pay

## 2023-07-10 DIAGNOSIS — I129 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 stage 4 chronic kidney disease, or unspecified chronic kidney disease: Secondary | ICD-10-CM | POA: Diagnosis not present

## 2023-07-10 DIAGNOSIS — N184 Chronic kidney disease, stage 4 (severe): Secondary | ICD-10-CM | POA: Diagnosis not present

## 2023-07-10 DIAGNOSIS — J449 Chronic obstructive pulmonary disease, unspecified: Secondary | ICD-10-CM | POA: Diagnosis not present

## 2023-07-10 DIAGNOSIS — I739 Peripheral vascular disease, unspecified: Secondary | ICD-10-CM | POA: Diagnosis not present

## 2023-07-10 DIAGNOSIS — F330 Major depressive disorder, recurrent, mild: Secondary | ICD-10-CM | POA: Diagnosis not present

## 2023-07-10 DIAGNOSIS — K922 Gastrointestinal hemorrhage, unspecified: Secondary | ICD-10-CM

## 2023-07-10 MED ORDER — TICAGRELOR 90 MG PO TABS: 90.0000 mg | ORAL_TABLET | Freq: Two times a day (BID) | ORAL | 2 refills | Status: DC

## 2023-07-10 NOTE — Telephone Encounter (Signed)
Patient called about getting her blood thinner refilled. She originally got Ticagrelor at the ED.  Per Dr. Wyn Quaker, I can refill it.

## 2023-07-10 NOTE — Progress Notes (Signed)
 Complex Care Management Note  Care Guide Note 07/10/2023 Name: Christine Burnett MRN: [id number] DOB: 09-01-1940  Christine Burnett is a 83 y.o. year old female who sees Tapia, Leisa, PA-C for primary care. I reached out to Roselie SHAUNNA Rand by phone today to offer complex care management services.  Ms. Bezold was given information about Complex Care Management services today including:   The Complex Care Management services include support from the care team which includes your Nurse Care Manager, Clinical Social Worker, or Pharmacist.  The Complex Care Management team is here to help remove barriers to the health concerns and goals most important to you. Complex Care Management services are voluntary, and the patient may decline or stop services at any time by request to their care team member.   Complex Care Management Consent Status: Patient did not agree to participate in complex care management services at this time.  Follow up plan:  pt appreciative but declined need for social work at this time - says she has 2 very supportive daughters  Encounter Outcome:  Patient Refused  Thedford Franks, CMA, Care Guide [facility]  [facility], [facility] Care Guide Direct Dial: [phone]  Fax: [phone] Website: [email or web address]

## 2023-07-12 ENCOUNTER — Ambulatory Visit: Payer: Medicare Other | Admitting: Surgery

## 2023-07-12 DIAGNOSIS — J449 Chronic obstructive pulmonary disease, unspecified: Secondary | ICD-10-CM | POA: Diagnosis not present

## 2023-07-12 DIAGNOSIS — I739 Peripheral vascular disease, unspecified: Secondary | ICD-10-CM | POA: Diagnosis not present

## 2023-07-12 DIAGNOSIS — N184 Chronic kidney disease, stage 4 (severe): Secondary | ICD-10-CM | POA: Diagnosis not present

## 2023-07-12 DIAGNOSIS — F330 Major depressive disorder, recurrent, mild: Secondary | ICD-10-CM | POA: Diagnosis not present

## 2023-07-12 DIAGNOSIS — K922 Gastrointestinal hemorrhage, unspecified: Secondary | ICD-10-CM | POA: Diagnosis not present

## 2023-07-12 DIAGNOSIS — I129 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 stage 4 chronic kidney disease, or unspecified chronic kidney disease: Secondary | ICD-10-CM | POA: Diagnosis not present

## 2023-07-17 DIAGNOSIS — D631 Anemia in chronic kidney disease: Secondary | ICD-10-CM | POA: Diagnosis not present

## 2023-07-17 DIAGNOSIS — K922 Gastrointestinal hemorrhage, unspecified: Secondary | ICD-10-CM | POA: Diagnosis not present

## 2023-07-17 DIAGNOSIS — J449 Chronic obstructive pulmonary disease, unspecified: Secondary | ICD-10-CM | POA: Diagnosis not present

## 2023-07-17 DIAGNOSIS — N1832 Chronic kidney disease, stage 3b: Secondary | ICD-10-CM | POA: Diagnosis not present

## 2023-07-17 DIAGNOSIS — F330 Major depressive disorder, recurrent, mild: Secondary | ICD-10-CM | POA: Diagnosis not present

## 2023-07-17 DIAGNOSIS — I739 Peripheral vascular disease, unspecified: Secondary | ICD-10-CM | POA: Diagnosis not present

## 2023-07-17 DIAGNOSIS — I10 Essential (primary) hypertension: Secondary | ICD-10-CM | POA: Diagnosis not present

## 2023-07-17 DIAGNOSIS — N2581 Secondary hyperparathyroidism of renal origin: Secondary | ICD-10-CM | POA: Diagnosis not present

## 2023-07-17 DIAGNOSIS — I129 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 stage 4 chronic kidney disease, or unspecified chronic kidney disease: Secondary | ICD-10-CM | POA: Diagnosis not present

## 2023-07-17 DIAGNOSIS — N184 Chronic kidney disease, stage 4 (severe): Secondary | ICD-10-CM | POA: Diagnosis not present

## 2023-07-18 ENCOUNTER — Telehealth: Payer: Self-pay | Admitting: Family Medicine

## 2023-07-18 NOTE — Telephone Encounter (Unsigned)
Copied from CRM [id number]. Topic: General - Other >> Jul 18, 2023 12:54 PM Santiya F wrote: Reason for CRM: Home Health Verbal Orders - Caller/Agency: Jatana King-[facility]  Callback Number:  [phone], Secured line  Service Requested: Physical Therapy Frequency: 3 week 1, 2 week 3, 1 week 5  Any new concerns about the patient? No   Clearence Cheek says she requested these orders on 07/09/23 and hasn't heard anything back. Please follow up with [place]

## 2023-07-19 DIAGNOSIS — I129 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 stage 4 chronic kidney disease, or unspecified chronic kidney disease: Secondary | ICD-10-CM | POA: Diagnosis not present

## 2023-07-19 DIAGNOSIS — J449 Chronic obstructive pulmonary disease, unspecified: Secondary | ICD-10-CM | POA: Diagnosis not present

## 2023-07-19 DIAGNOSIS — N184 Chronic kidney disease, stage 4 (severe): Secondary | ICD-10-CM | POA: Diagnosis not present

## 2023-07-19 DIAGNOSIS — I739 Peripheral vascular disease, unspecified: Secondary | ICD-10-CM | POA: Diagnosis not present

## 2023-07-19 DIAGNOSIS — K922 Gastrointestinal hemorrhage, unspecified: Secondary | ICD-10-CM | POA: Diagnosis not present

## 2023-07-19 DIAGNOSIS — F330 Major depressive disorder, recurrent, mild: Secondary | ICD-10-CM | POA: Diagnosis not present

## 2023-07-19 NOTE — Telephone Encounter (Signed)
Verbal orders given

## 2023-07-20 NOTE — Telephone Encounter (Signed)
Verbal orders given

## 2023-07-20 NOTE — Telephone Encounter (Signed)
Copied from CRM [id number]. Topic: Quick Communication - Home Health Verbal Orders >> Jul 20, 2023 11:54 AM Shon Hale wrote: Caller/Agency: Misty Stanley - [facility] Callback Number: [phone] - Can leave a message. Service Requested: Occupational Therapy Frequency: n/a Requesting to mpve evaluation for OT from lastweek to next week. Any new concerns about the patient? No

## 2023-07-23 DIAGNOSIS — F330 Major depressive disorder, recurrent, mild: Secondary | ICD-10-CM | POA: Diagnosis not present

## 2023-07-23 DIAGNOSIS — I739 Peripheral vascular disease, unspecified: Secondary | ICD-10-CM | POA: Diagnosis not present

## 2023-07-23 DIAGNOSIS — N184 Chronic kidney disease, stage 4 (severe): Secondary | ICD-10-CM | POA: Diagnosis not present

## 2023-07-23 DIAGNOSIS — J449 Chronic obstructive pulmonary disease, unspecified: Secondary | ICD-10-CM | POA: Diagnosis not present

## 2023-07-23 DIAGNOSIS — I129 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 stage 4 chronic kidney disease, or unspecified chronic kidney disease: Secondary | ICD-10-CM | POA: Diagnosis not present

## 2023-07-23 DIAGNOSIS — K922 Gastrointestinal hemorrhage, unspecified: Secondary | ICD-10-CM | POA: Diagnosis not present

## 2023-07-26 DIAGNOSIS — I739 Peripheral vascular disease, unspecified: Secondary | ICD-10-CM | POA: Diagnosis not present

## 2023-07-26 DIAGNOSIS — J449 Chronic obstructive pulmonary disease, unspecified: Secondary | ICD-10-CM | POA: Diagnosis not present

## 2023-07-26 DIAGNOSIS — F330 Major depressive disorder, recurrent, mild: Secondary | ICD-10-CM | POA: Diagnosis not present

## 2023-07-26 DIAGNOSIS — N184 Chronic kidney disease, stage 4 (severe): Secondary | ICD-10-CM | POA: Diagnosis not present

## 2023-07-26 DIAGNOSIS — I129 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 stage 4 chronic kidney disease, or unspecified chronic kidney disease: Secondary | ICD-10-CM | POA: Diagnosis not present

## 2023-07-26 DIAGNOSIS — K922 Gastrointestinal hemorrhage, unspecified: Secondary | ICD-10-CM | POA: Diagnosis not present

## 2023-07-31 ENCOUNTER — Ambulatory Visit: Payer: Medicare Other | Admitting: Physician Assistant

## 2023-07-31 DIAGNOSIS — K922 Gastrointestinal hemorrhage, unspecified: Secondary | ICD-10-CM | POA: Diagnosis not present

## 2023-07-31 DIAGNOSIS — N184 Chronic kidney disease, stage 4 (severe): Secondary | ICD-10-CM | POA: Diagnosis not present

## 2023-07-31 DIAGNOSIS — F330 Major depressive disorder, recurrent, mild: Secondary | ICD-10-CM | POA: Diagnosis not present

## 2023-07-31 DIAGNOSIS — J449 Chronic obstructive pulmonary disease, unspecified: Secondary | ICD-10-CM | POA: Diagnosis not present

## 2023-07-31 DIAGNOSIS — I129 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 stage 4 chronic kidney disease, or unspecified chronic kidney disease: Secondary | ICD-10-CM | POA: Diagnosis not present

## 2023-07-31 DIAGNOSIS — I739 Peripheral vascular disease, unspecified: Secondary | ICD-10-CM | POA: Diagnosis not present

## 2023-08-01 ENCOUNTER — Encounter: Payer: Self-pay | Admitting: Family Medicine

## 2023-08-01 ENCOUNTER — Ambulatory Visit (INDEPENDENT_AMBULATORY_CARE_PROVIDER_SITE_OTHER): Payer: Medicare Other | Admitting: Family Medicine

## 2023-08-01 DIAGNOSIS — F330 Major depressive disorder, recurrent, mild: Secondary | ICD-10-CM

## 2023-08-01 DIAGNOSIS — G459 Transient cerebral ischemic attack, unspecified: Secondary | ICD-10-CM

## 2023-08-01 DIAGNOSIS — J441 Chronic obstructive pulmonary disease with (acute) exacerbation: Secondary | ICD-10-CM

## 2023-08-01 DIAGNOSIS — N1832 Chronic kidney disease, stage 3b: Secondary | ICD-10-CM

## 2023-08-03 DIAGNOSIS — F330 Major depressive disorder, recurrent, mild: Secondary | ICD-10-CM | POA: Diagnosis not present

## 2023-08-03 DIAGNOSIS — I129 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 stage 4 chronic kidney disease, or unspecified chronic kidney disease: Secondary | ICD-10-CM | POA: Diagnosis not present

## 2023-08-03 DIAGNOSIS — N184 Chronic kidney disease, stage 4 (severe): Secondary | ICD-10-CM | POA: Diagnosis not present

## 2023-08-03 DIAGNOSIS — I739 Peripheral vascular disease, unspecified: Secondary | ICD-10-CM | POA: Diagnosis not present

## 2023-08-03 DIAGNOSIS — J449 Chronic obstructive pulmonary disease, unspecified: Secondary | ICD-10-CM | POA: Diagnosis not present

## 2023-08-03 DIAGNOSIS — K922 Gastrointestinal hemorrhage, unspecified: Secondary | ICD-10-CM | POA: Diagnosis not present

## 2023-08-07 ENCOUNTER — Encounter: Payer: Self-pay | Admitting: Gastroenterology

## 2023-08-07 DIAGNOSIS — F330 Major depressive disorder, recurrent, mild: Secondary | ICD-10-CM | POA: Diagnosis not present

## 2023-08-07 DIAGNOSIS — N184 Chronic kidney disease, stage 4 (severe): Secondary | ICD-10-CM | POA: Diagnosis not present

## 2023-08-07 DIAGNOSIS — Z9181 History of falling: Secondary | ICD-10-CM | POA: Diagnosis not present

## 2023-08-07 DIAGNOSIS — F419 Anxiety disorder, unspecified: Secondary | ICD-10-CM | POA: Diagnosis not present

## 2023-08-07 DIAGNOSIS — B029 Zoster without complications: Secondary | ICD-10-CM | POA: Diagnosis not present

## 2023-08-07 DIAGNOSIS — J449 Chronic obstructive pulmonary disease, unspecified: Secondary | ICD-10-CM | POA: Diagnosis not present

## 2023-08-07 DIAGNOSIS — I2510 Atherosclerotic heart disease of native coronary artery without angina pectoris: Secondary | ICD-10-CM | POA: Diagnosis not present

## 2023-08-07 DIAGNOSIS — E785 Hyperlipidemia, unspecified: Secondary | ICD-10-CM | POA: Diagnosis not present

## 2023-08-07 DIAGNOSIS — Z85118 Personal history of other malignant neoplasm of bronchus and lung: Secondary | ICD-10-CM | POA: Diagnosis not present

## 2023-08-07 DIAGNOSIS — Z8673 Personal history of transient ischemic attack (TIA), and cerebral infarction without residual deficits: Secondary | ICD-10-CM | POA: Diagnosis not present

## 2023-08-07 DIAGNOSIS — K922 Gastrointestinal hemorrhage, unspecified: Secondary | ICD-10-CM | POA: Diagnosis not present

## 2023-08-07 DIAGNOSIS — Z7951 Long term (current) use of inhaled steroids: Secondary | ICD-10-CM | POA: Diagnosis not present

## 2023-08-07 DIAGNOSIS — Z7982 Long term (current) use of aspirin: Secondary | ICD-10-CM | POA: Diagnosis not present

## 2023-08-07 DIAGNOSIS — M8008XD Age-related osteoporosis with current pathological fracture, vertebra(e), subsequent encounter for fracture with routine healing: Secondary | ICD-10-CM | POA: Diagnosis not present

## 2023-08-07 DIAGNOSIS — I129 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 stage 4 chronic kidney disease, or unspecified chronic kidney disease: Secondary | ICD-10-CM | POA: Diagnosis not present

## 2023-08-07 DIAGNOSIS — I739 Peripheral vascular disease, unspecified: Secondary | ICD-10-CM | POA: Diagnosis not present

## 2023-08-07 DIAGNOSIS — K5730 Diverticulosis of large intestine without perforation or abscess without bleeding: Secondary | ICD-10-CM | POA: Diagnosis not present

## 2023-08-07 DIAGNOSIS — D539 Nutritional anemia, unspecified: Secondary | ICD-10-CM | POA: Diagnosis not present

## 2023-08-07 DIAGNOSIS — N2581 Secondary hyperparathyroidism of renal origin: Secondary | ICD-10-CM | POA: Diagnosis not present

## 2023-08-09 NOTE — Progress Notes (Signed)
 Pt was not due for routine f/up appt until April Her appt was changed

## 2023-08-14 ENCOUNTER — Telehealth: Payer: Self-pay

## 2023-08-14 DIAGNOSIS — N184 Chronic kidney disease, stage 4 (severe): Secondary | ICD-10-CM | POA: Diagnosis not present

## 2023-08-14 DIAGNOSIS — F330 Major depressive disorder, recurrent, mild: Secondary | ICD-10-CM | POA: Diagnosis not present

## 2023-08-14 DIAGNOSIS — I739 Peripheral vascular disease, unspecified: Secondary | ICD-10-CM | POA: Diagnosis not present

## 2023-08-14 DIAGNOSIS — K922 Gastrointestinal hemorrhage, unspecified: Secondary | ICD-10-CM | POA: Diagnosis not present

## 2023-08-14 DIAGNOSIS — I129 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 stage 4 chronic kidney disease, or unspecified chronic kidney disease: Secondary | ICD-10-CM | POA: Diagnosis not present

## 2023-08-14 DIAGNOSIS — J449 Chronic obstructive pulmonary disease, unspecified: Secondary | ICD-10-CM | POA: Diagnosis not present

## 2023-08-14 NOTE — Telephone Encounter (Unsigned)
 Copied from CRM [id number]. Topic: Clinical - Home Health Verbal Orders >> Aug 14, 2023  1:51 PM Truddie Crumble wrote: Caller/Agency: tiffany from [facility] Callback Number: [phone] option 2 Service Requested: Occupational Therapy Frequency: evaluation Any new concerns about the patient? No

## 2023-08-15 NOTE — Telephone Encounter (Signed)
 VO given.

## 2023-08-16 DIAGNOSIS — N184 Chronic kidney disease, stage 4 (severe): Secondary | ICD-10-CM | POA: Diagnosis not present

## 2023-08-16 DIAGNOSIS — K922 Gastrointestinal hemorrhage, unspecified: Secondary | ICD-10-CM | POA: Diagnosis not present

## 2023-08-16 DIAGNOSIS — I739 Peripheral vascular disease, unspecified: Secondary | ICD-10-CM | POA: Diagnosis not present

## 2023-08-16 DIAGNOSIS — J449 Chronic obstructive pulmonary disease, unspecified: Secondary | ICD-10-CM | POA: Diagnosis not present

## 2023-08-16 DIAGNOSIS — F330 Major depressive disorder, recurrent, mild: Secondary | ICD-10-CM | POA: Diagnosis not present

## 2023-08-16 DIAGNOSIS — I129 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 stage 4 chronic kidney disease, or unspecified chronic kidney disease: Secondary | ICD-10-CM | POA: Diagnosis not present

## 2023-08-21 ENCOUNTER — Ambulatory Visit (INDEPENDENT_AMBULATORY_CARE_PROVIDER_SITE_OTHER): Payer: Medicare Other | Admitting: Vascular Surgery

## 2023-08-21 ENCOUNTER — Encounter (INDEPENDENT_AMBULATORY_CARE_PROVIDER_SITE_OTHER): Payer: Self-pay | Admitting: Vascular Surgery

## 2023-08-21 ENCOUNTER — Ambulatory Visit (INDEPENDENT_AMBULATORY_CARE_PROVIDER_SITE_OTHER): Payer: Medicare Other

## 2023-08-21 VITALS — BP 101/66 | HR 98 | Resp 16

## 2023-08-21 DIAGNOSIS — I739 Peripheral vascular disease, unspecified: Secondary | ICD-10-CM

## 2023-08-21 DIAGNOSIS — I771 Stricture of artery: Secondary | ICD-10-CM | POA: Diagnosis not present

## 2023-08-21 DIAGNOSIS — E782 Mixed hyperlipidemia: Secondary | ICD-10-CM | POA: Diagnosis not present

## 2023-08-21 NOTE — Progress Notes (Unsigned)
 MRN : [id number]  Christine Burnett is a 83 y.o. (04/18/1941) female who presents with chief complaint of No chief complaint on file. Marland Kitchen  History of Present Illness: Patient returns today in follow up of ***  Current Outpatient Medications  Medication Sig Dispense Refill   albuterol (VENTOLIN HFA) 108 (90 Base) MCG/ACT inhaler Inhale 2 puffs into the lungs every 4 (four) hours as needed for wheezing or shortness of breath. 8 g 1   aspirin 81 MG EC tablet Take 81 mg by mouth at bedtime. Swallow whole.     atorvastatin (LIPITOR) 40 MG tablet Take 1 tablet (40 mg total) by mouth at bedtime. 90 tablet 1   busPIRone (BUSPAR) 5 MG tablet Take 1 tablet (5 mg total) by mouth 2 (two) times daily as needed (stress anxiety). 60 tablet 1   calcium carbonate (TUMS - DOSED IN MG ELEMENTAL CALCIUM) 500 MG chewable tablet Chew 1.5 tablets (300 mg of elemental calcium total) by mouth every Monday, Tuesday, Wednesday, Thursday, and Friday.     Cholecalciferol (VITAMIN D3) 50 MCG (2000 UT) TABS Take 2,000 Units by mouth at bedtime.     ferrous sulfate 325 (65 FE) MG EC tablet Take 1 tablet (325 mg total) by mouth every other day. 60 tablet 0   fluticasone furoate-vilanterol (BREO ELLIPTA) 100-25 MCG/ACT AEPB Inhale 1 puff into the lungs daily. 1 each 11   Melatonin 10 MG TABS Take 10 mg by mouth at bedtime.     Multiple Vitamin (MULTIVITAMIN) capsule Take 1 capsule by mouth daily.     PARoxetine (PAXIL) 40 MG tablet Take 1 tablet (40 mg total) by mouth every morning. 90 tablet 2   ticagrelor (BRILINTA) 90 MG TABS tablet Take 1 tablet (90 mg total) by mouth 2 (two) times daily. 60 tablet 2   No current facility-administered medications for this visit.    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   Managed by nephrologist   Lower GI bleed 2025   Non-small cell lung cancer (NSCLC) (HCC)    Osteoporosis    Oxygen deficiency Me   Risk for falls    Situational disturbance    Stroke (HCC) 2011   tia's x 2   TIA (transient ischemic attack)     Past Surgical History:  Procedure Laterality Date   CAROTID PTA/STENT INTERVENTION Right 04/20/2023   Procedure: CAROTID PTA/STENT INTERVENTION;  Surgeon: Annice Needy, MD;  Location: [facility] INVASIVE CV LAB;  Service: Cardiovascular;  Laterality: Right;  Inominate Artery Stenosis requiring stenting   COLONOSCOPY  2016   COLONOSCOPY WITH PROPOFOL N/A 06/29/2023   Procedure: COLONOSCOPY WITH PROPOFOL;  Surgeon: Midge Minium, MD;  Location: [facility] ENDOSCOPY;  Service: Endoscopy;  Laterality: N/A;   ESOPHAGUS SURGERY     INTRAMEDULLARY (IM) NAIL INTERTROCHANTERIC Left 03/26/2017   Procedure: INTRAMEDULLARY (IM) NAIL INTERTROCHANTRIC;  Surgeon: Kennedy Bucker, MD;  Location: [facility] ORS;  Service: Orthopedics;  Laterality: Left;  LEFT HEART CATH AND CORONARY ANGIOGRAPHY N/A 04/15/2021   Procedure: LEFT HEART CATH AND CORONARY ANGIOGRAPHY;  Surgeon: Iran Ouch, MD;  Location: [facility] INV[facility] CV LAB;  Service: Cardiovascular;  Laterality: N/A;   RIGHT/LEFT HEART CATH AND CORONARY ANGIOGRAPHY Bilateral 05/15/2022   Procedure: RIGHT/LEFT HEART CATH AND CORONARY ANGIOGRAPHY;  Surgeon: Iran Ouch, MD;  Location: [facility] CV [facility];  Service: Cardiovascular;  Laterality: Bilateral;   VIDEO BRONCHOSCOPY WITH ENDOBRONCHIAL NAVIGATION Right 06/28/2020   Procedure: VIDEO BRONCHOSCOPY WITH ENDOBRONCHIAL NAVIGATION CELLVIZIO;  Surgeon: Salena Saner, MD;  Location: [facility] ORS;  Service:  Pulmonary;  Laterality: Right;     Social History   Tobacco Use   Smoking status: Former    Current packs/day: 0.00    Average packs/day: 1 pack/day for 61.0 years (61.0 ttl pk-yrs)    Types: Cigarettes    Start date: 03/25/1956    Quit date: 03/25/2017    Years since quitting: 6.4   Smokeless tobacco: Never  Vaping Use   Vaping status: Never Used  Substance Use Topics   Alcohol use: No   Drug use: No      Family History  Problem Relation Age of Onset   Other Mother        [place] posioning   Kidney disease Mother    Heart attack Father    Prostate cancer Father    Cancer Father    Heart disease Father    Heart attack Brother    Diabetes Brother    Hypertension Daughter    Kidney cancer Neg Hx    Bladder Cancer Neg Hx    Colon cancer Neg Hx    Colon polyps Neg Hx    Rectal cancer Neg Hx      Allergies  Allergen Reactions   Zithromax [Azithromycin]    Cefuroxime Axetil Rash   Ciprofloxacin Nausea Only    Unknown   Codeine Other (See Comments)    Patient does not like how the medication makes you feel      REVIEW OF SYSTEMS (Negative unless checked)   Constitutional: [] Weight loss  [] Fever  [] Chills Cardiac: [] Chest pain   [] Chest pressure   [] Palpitations   [] Shortness of breath when laying flat   [] Shortness of breath at rest   [] Shortness of breath with exertion. Vascular:  [x] Pain in legs with walking   [] Pain in legs at rest   [] Pain in legs when laying flat   [] Claudication   [] Pain in feet when walking  [] Pain in feet at rest  [] Pain in feet when laying flat   [] History of DVT   [] Phlebitis   [] Swelling in legs   [] Varicose veins   [] Non-healing ulcers Pulmonary:   [] Uses home oxygen   [] Productive cough   [] Hemoptysis   [] Wheeze  [] COPD   [] Asthma Neurologic:  [] Dizziness  [] Blackouts   [] Seizures   [] History of stroke   [x] History of TIA  [] Aphasia   [] Temporary blindness   [] Dysphagia   [] Weakness or numbness in arms   [] Weakness or numbness in  legs Musculoskeletal:  [] Arthritis   [] Joint swelling   [] Joint pain   [] Low back pain Hematologic:  [] Easy bruising  [] Easy bleeding   [] Hypercoagulable state   [] Anemic   Gastrointestinal:  [] Blood in stool   [] Vomiting blood  [] Gastroesophageal reflux/heartburn   [] Abdominal pain Genitourinary:  [x] Chronic kidney disease   [] Difficult urination  [] Frequent urination  [] Burning with urination   [] Hematuria Skin:  [] Rashes   [] Ulcers   []   Wounds Psychological:  [] History of anxiety   []  History of major depression.  Physical Examination  There were no vitals taken for this visit. Gen:  WD/WN, NAD Head: [place]/AT, No temporalis wasting. Ear/Nose/Throat: Hearing grossly intact, nares w/o erythema or drainage Eyes: Conjunctiva clear. Sclera non-icteric Neck: Supple.  Trachea midline Pulmonary:  Good air movement, no use of accessory muscles.  Cardiac: RRR, no JVD Vascular:  Vessel Right Left  Radial Palpable Palpable                          PT Palpable Palpable  DP Palpable Palpable   Gastrointestinal: soft, non-tender/non-distended. No guarding/reflex.  Musculoskeletal: M/S 5/5 throughout.  No deformity or atrophy. *** edema. Neurologic: Sensation grossly intact in extremities.  Symmetrical.  Speech is fluent.  Psychiatric: Judgment intact, Mood & affect appropriate for pt's clinical situation. Dermatologic: No rashes or ulcers noted.  No cellulitis or open wounds.      Labs Recent Results (from the past 2160 hours)  CBC w/Diff/Platelet     Status: Abnormal   Collection Time: 05/24/23  3:24 PM  Result Value Ref Range   WBC 6.9 3.8 - 10.8 Thousand/uL   RBC 3.43 (L) 3.80 - 5.10 Million/uL   Hemoglobin 11.1 (L) 11.7 - 15.5 g/dL   HCT 78.2 (L) 95.6 - 21.3 %   MCV 101.2 (H) 80.0 - 100.0 fL   MCH 32.4 27.0 - 33.0 pg   MCHC 32.0 32.0 - 36.0 g/dL    Comment: For adults, a slight decrease in the calculated MCHC value (in the range of 30 to 32 g/dL) is most likely not  clinically significant; however, it should be interpreted with caution in correlation with other red cell parameters and the patient's clinical condition.    RDW 14.7 11.0 - 15.0 %   Platelets 234 140 - 400 Thousand/uL   MPV 11.0 7.5 - 12.5 fL   Neutro Abs 5,320 1,500 - 7,800 cells/uL   Absolute Lymphocytes 959 850 - 3,900 cells/uL   Absolute Monocytes 428 200 - 950 cells/uL   Eosinophils Absolute 152 15 - 500 cells/uL   Basophils Absolute 41 0 - 200 cells/uL   Neutrophils Relative % 77.1 %   Total Lymphocyte 13.9 %   Monocytes Relative 6.2 %   Eosinophils Relative 2.2 %   Basophils Relative 0.6 %  COMPLETE METABOLIC PANEL WITH GFR     Status: Abnormal   Collection Time: 05/24/23  3:24 PM  Result Value Ref Range   Glucose, Bld 95 65 - 99 mg/dL    Comment: .            Fasting reference interval .    BUN 27 (H) 7 - 25 mg/dL   Creat 0.86 (H) 5.78 - 0.95 mg/dL   eGFR 32 (L) > OR = 60 mL/min/1.25m2   BUN/Creatinine Ratio 17 6 - 22 (calc)   Sodium 138 135 - 146 mmol/L   Potassium 4.4 3.5 - 5.3 mmol/L   Chloride 102 98 - 110 mmol/L   CO2 28 20 - 32 mmol/L   Calcium 9.9 8.6 - 10.4 mg/dL   Total Protein 7.6 6.1 - 8.1 g/dL   Albumin 4.2 3.6 - 5.1 g/dL   Globulin 3.4 1.9 - 3.7 g/dL (calc)   AG Ratio 1.2 1.0 - 2.5 (calc)   Total Bilirubin 0.8 0.2 - 1.2 mg/dL   Alkaline phosphatase (APISO) 111 37 - 153 U/L   AST 17 10 -  35 U/L   ALT 10 6 - 29 U/L  Iron, TIBC and Ferritin Panel     Status: None   Collection Time: 05/24/23  3:24 PM  Result Value Ref Range   Iron 89 45 - 160 mcg/dL   TIBC 161 096 - 045 mcg/dL (calc)   %SAT 26 16 - 45 % (calc)   Ferritin 44 16 - 288 ng/mL  B12 and Folate Panel     Status: None   Collection Time: 05/24/23  3:24 PM  Result Value Ref Range   Vitamin B-12 410 200 - 1,100 pg/mL   Folate >24.0 ng/mL    Comment:                            Reference Range                            Low:           <3.4                            Borderline:     3.4-5.4                            Normal:        >5.4 .   CBC with Differential     Status: Abnormal   Collection Time: 06/11/23  3:42 PM  Result Value Ref Range   WBC 7.6 4.0 - 10.5 K/uL   RBC 3.30 (L) 3.87 - 5.11 MIL/uL   Hemoglobin 11.0 (L) 12.0 - 15.0 g/dL   HCT 40.9 (L) 81.1 - 91.4 %   MCV 102.1 (H) 80.0 - 100.0 fL   MCH 33.3 26.0 - 34.0 pg   MCHC 32.6 30.0 - 36.0 g/dL   RDW 78.2 (H) 95.6 - 21.3 %   Platelets 229 150 - 400 K/uL   nRBC 0.0 0.0 - 0.2 %   Neutrophils Relative % 73 %   Neutro Abs 5.6 1.7 - 7.7 K/uL   Lymphocytes Relative 17 %   Lymphs Abs 1.3 0.7 - 4.0 K/uL   Monocytes Relative 6 %   Monocytes Absolute 0.5 0.1 - 1.0 K/uL   Eosinophils Relative 2 %   Eosinophils Absolute 0.1 0.0 - 0.5 K/uL   Basophils Relative 1 %   Basophils Absolute 0.0 0.0 - 0.1 K/uL   Immature Granulocytes 1 %   Abs Immature Granulocytes 0.04 0.00 - 0.07 K/uL    Comment: Performed at [facility], [address] [place], [place] [address]  Comprehensive metabolic panel     Status: Abnormal   Collection Time: 06/11/23  3:42 PM  Result Value Ref Range   Sodium 140 135 - 145 mmol/L   Potassium 4.8 3.5 - 5.1 mmol/L   Chloride 104 98 - 111 mmol/L   CO2 23 22 - 32 mmol/L   Glucose, Bld 113 (H) 70 - 99 mg/dL    Comment: Glucose reference range applies only to samples taken after fasting for at least 8 hours.   BUN 30 (H) 8 - 23 mg/dL   Creatinine, Ser 8.46 (H) 0.44 - 1.00 mg/dL   Calcium 96.2 8.9 - 95.2 mg/dL   Total Protein 8.2 (H) 6.5 - 8.1 g/dL   Albumin 4.0 3.5 - 5.0 g/dL   AST 23 15 -  41 U/L   ALT 18 0 - 44 U/L   Alkaline Phosphatase 110 38 - 126 U/L   Total Bilirubin 0.8 0.0 - 1.2 mg/dL   GFR, Estimated 31 (L) >60 mL/min    Comment: (NOTE) Calculated using the CKD-EPI Creatinine Equation (2021)    Anion gap 13 5 - 15    Comment: Performed at [facility], [address] [place], [place] [address]  Magnesium     Status: None   Collection Time: 06/11/23   3:42 PM  Result Value Ref Range   Magnesium 2.4 1.7 - 2.4 mg/dL    Comment: Performed at [facility], [address], [place], [place] [address]  Troponin I (High Sensitivity)     Status: None   Collection Time: 06/11/23  3:42 PM  Result Value Ref Range   Troponin I (High Sensitivity) 14 <18 ng/L    Comment: (NOTE) Elevated high sensitivity troponin I (hsTnI) values and significant  changes across serial measurements may suggest ACS but many other  chronic and acute conditions are known to elevate hsTnI results.  Refer to the "Links" section for chest pain algorithms and additional  guidance. Performed at [facility], [address] [place], [place] [address]   Troponin I (High Sensitivity)     Status: None   Collection Time: 06/11/23  6:38 PM  Result Value Ref Range   Troponin I (High Sensitivity) 13 <18 ng/L    Comment: (NOTE) Elevated high sensitivity troponin I (hsTnI) values and significant  changes across serial measurements may suggest ACS but many other  chronic and acute conditions are known to elevate hsTnI results.  Refer to the "Links" section for chest pain algorithms and additional  guidance. Performed at [facility] [address] [place], [place] [address]   Comprehensive metabolic panel     Status: Abnormal   Collection Time: 06/28/23 11:14 AM  Result Value Ref Range   Sodium 138 135 - 145 mmol/L   Potassium 4.3 3.5 - 5.1 mmol/L   Chloride 103 98 - 111 mmol/L   CO2 23 22 - 32 mmol/L   Glucose, Bld 126 (H) 70 - 99 mg/dL    Comment: Glucose reference range applies only to samples taken after fasting for at least 8 hours.   BUN 40 (H) 8 - 23 mg/dL   Creatinine, Ser 4.74 (H) 0.44 - 1.00 mg/dL   Calcium 9.4 8.9 - 25.9 mg/dL   Total Protein 7.7 6.5 - 8.1 g/dL   Albumin 3.6 3.5 - 5.0 g/dL   AST 18 15 - 41 U/L   ALT 16 0 - 44 U/L   Alkaline Phosphatase 117 38 - 126 U/L   Total Bilirubin 0.7 0.0 - 1.2 mg/dL   GFR, Estimated  32 (L) >60 mL/min    Comment: (NOTE) Calculated using the CKD-EPI Creatinine Equation (2021)    Anion gap 12 5 - 15    Comment: Performed at [facility], [address] [place], [place] [address]  CBC     Status: Abnormal   Collection Time: 06/28/23 11:14 AM  Result Value Ref Range   WBC 12.1 (H) 4.0 - 10.5 K/uL   RBC 3.45 (L) 3.87 - 5.11 MIL/uL   Hemoglobin 11.5 (L) 12.0 - 15.0 g/dL   HCT 56.4 (L) 33.2 - 95.1 %   MCV 100.3 (H) 80.0 - 100.0 fL   MCH 33.3 26.0 - 34.0 pg   MCHC 33.2 30.0 - 36.0 g/dL  RDW 15.4 11.5 - 15.5 %   Platelets 283 150 - 400 K/uL   nRBC 0.0 0.0 - 0.2 %    Comment: Performed at [facility], [address] [place], [place] [address]  Type and screen [facility]     Status: None   Collection Time: 06/28/23 11:14 AM  Result Value Ref Range   ABO/RH(D) O POS    Antibody Screen NEG    Sample Expiration      07/01/2023,2359 Performed at [facility], [address] [place], [place] [address]   CBC     Status: Abnormal   Collection Time: 06/29/23  6:22 AM  Result Value Ref Range   WBC 7.0 4.0 - 10.5 K/uL   RBC 2.33 (L) 3.87 - 5.11 MIL/uL   Hemoglobin 8.1 (L) 12.0 - 15.0 g/dL   HCT 82.9 (L) 56.2 - 13.0 %   MCV 110.3 (H) 80.0 - 100.0 fL   MCH 34.8 (H) 26.0 - 34.0 pg   MCHC 31.5 30.0 - 36.0 g/dL   RDW 86.5 78.4 - 69.6 %   Platelets 188 150 - 400 K/uL   nRBC 0.0 0.0 - 0.2 %    Comment: Performed at [facility], [address] [place], [place] [address]  Basic metabolic panel     Status: Abnormal   Collection Time: 06/29/23  6:22 AM  Result Value Ref Range   Sodium 140 135 - 145 mmol/L   Potassium 4.3 3.5 - 5.1 mmol/L   Chloride 110 98 - 111 mmol/L   CO2 20 (L) 22 - 32 mmol/L   Glucose, Bld 102 (H) 70 - 99 mg/dL    Comment: Glucose reference range applies only to samples taken after fasting for at least 8 hours.   BUN 34 (H) 8 - 23 mg/dL   Creatinine, Ser 4.13 (H) 0.44 - 1.00 mg/dL    Calcium 8.7 (L) 8.9 - 10.3 mg/dL   GFR, Estimated 38 (L) >60 mL/min    Comment: (NOTE) Calculated using the CKD-EPI Creatinine Equation (2021)    Anion gap 10 5 - 15    Comment: Performed at [facility], [address] [place], [place] [address]  Hemoglobin and hematocrit, blood     Status: Abnormal   Collection Time: 06/29/23  3:36 PM  Result Value Ref Range   Hemoglobin 7.6 (L) 12.0 - 15.0 g/dL   HCT 02.7 (L) 25.3 - 66.4 %    Comment: Performed at [facility], [address] [place], [place] [address]  CBC     Status: Abnormal   Collection Time: 06/30/23  3:57 AM  Result Value Ref Range   WBC 10.0 4.0 - 10.5 K/uL   RBC 2.20 (L) 3.87 - 5.11 MIL/uL   Hemoglobin 7.7 (L) 12.0 - 15.0 g/dL   HCT 42.5 (L) 95.6 - 38.7 %   MCV 104.5 (H) 80.0 - 100.0 fL   MCH 35.0 (H) 26.0 - 34.0 pg   MCHC 33.5 30.0 - 36.0 g/dL   RDW 56.4 (H) 33.2 - 95.1 %   Platelets 175 150 - 400 K/uL   nRBC 0.0 0.0 - 0.2 %    Comment: Performed at [facility], [address] [place], [place] [address]  Basic metabolic panel     Status: Abnormal   Collection Time: 06/30/23  3:57 AM  Result Value Ref Range   Sodium 137 135 - 145 mmol/L   Potassium 4.3 3.5 - 5.1 mmol/L  Chloride 106 98 - 111 mmol/L   CO2 23 22 - 32 mmol/L   Glucose, Bld 114 (H) 70 - 99 mg/dL    Comment: Glucose reference range applies only to samples taken after fasting for at least 8 hours.   BUN 25 (H) 8 - 23 mg/dL   Creatinine, Ser 2.13 (H) 0.44 - 1.00 mg/dL   Calcium 8.8 (L) 8.9 - 10.3 mg/dL   GFR, Estimated 39 (L) >60 mL/min    Comment: (NOTE) Calculated using the CKD-EPI Creatinine Equation (2021)    Anion gap 8 5 - 15    Comment: Performed at [facility], [address] [place], [place] [address]  CBC     Status: Abnormal   Collection Time: 07/01/23  4:03 AM  Result Value Ref Range   WBC 7.6 4.0 - 10.5 K/uL   RBC 2.13 (L) 3.87 - 5.11 MIL/uL   Hemoglobin 7.2 (L) 12.0 - 15.0 g/dL   HCT  84.6 (L) 96.2 - 46.0 %   MCV 101.9 (H) 80.0 - 100.0 fL   MCH 33.8 26.0 - 34.0 pg   MCHC 33.2 30.0 - 36.0 g/dL   RDW 95.2 (H) 84.1 - 32.4 %   Platelets 175 150 - 400 K/uL   nRBC 0.0 0.0 - 0.2 %    Comment: Performed at [facility], [address] [place], [place] [address]  Basic metabolic panel     Status: Abnormal   Collection Time: 07/01/23  4:03 AM  Result Value Ref Range   Sodium 136 135 - 145 mmol/L   Potassium 4.6 3.5 - 5.1 mmol/L   Chloride 104 98 - 111 mmol/L   CO2 25 22 - 32 mmol/L   Glucose, Bld 110 (H) 70 - 99 mg/dL    Comment: Glucose reference range applies only to samples taken after fasting for at least 8 hours.   BUN 20 8 - 23 mg/dL   Creatinine, Ser 7.25 (H) 0.44 - 1.00 mg/dL   Calcium 8.5 (L) 8.9 - 10.3 mg/dL   GFR, Estimated 33 (L) >60 mL/min    Comment: (NOTE) Calculated using the CKD-EPI Creatinine Equation (2021)    Anion gap 7 5 - 15    Comment: Performed at [facility], [address] [place], [place] [address]  Hemoglobin and hematocrit, blood     Status: Abnormal   Collection Time: 07/01/23  3:06 PM  Result Value Ref Range   Hemoglobin 7.6 (L) 12.0 - 15.0 g/dL   HCT 03.4 (L) 74.2 - 59.5 %    Comment: Performed at [facility], [address] [place], [place] [address]  CBC     Status: Abnormal   Collection Time: 07/02/23  4:14 AM  Result Value Ref Range   WBC 8.0 4.0 - 10.5 K/uL   RBC 2.15 (L) 3.87 - 5.11 MIL/uL   Hemoglobin 7.4 (L) 12.0 - 15.0 g/dL   HCT 64.3 (L) 32.9 - 51.8 %   MCV 102.8 (H) 80.0 - 100.0 fL   MCH 34.4 (H) 26.0 - 34.0 pg   MCHC 33.5 30.0 - 36.0 g/dL   RDW 84.1 (H) 66.0 - 63.0 %   Platelets 170 150 - 400 K/uL   nRBC 0.0 0.0 - 0.2 %    Comment: Performed at [facility], [address], [place], [place] [address]  Basic metabolic panel     Status: Abnormal   Collection Time: 07/02/23  4:14 AM  Result Value Ref Range  Sodium 135 135 - 145 mmol/L   Potassium 3.9 3.5 - 5.1 mmol/L    Chloride 101 98 - 111 mmol/L   CO2 24 22 - 32 mmol/L   Glucose, Bld 102 (H) 70 - 99 mg/dL    Comment: Glucose reference range applies only to samples taken after fasting for at least 8 hours.   BUN 17 8 - 23 mg/dL   Creatinine, Ser 1.66 (H) 0.44 - 1.00 mg/dL   Calcium 8.7 (L) 8.9 - 10.3 mg/dL   GFR, Estimated 37 (L) >60 mL/min    Comment: (NOTE) Calculated using the CKD-EPI Creatinine Equation (2021)    Anion gap 10 5 - 15    Comment: Performed at [facility], [address] [place], [place] [address]  CBC     Status: Abnormal   Collection Time: 07/03/23  4:30 AM  Result Value Ref Range   WBC 8.8 4.0 - 10.5 K/uL   RBC 2.13 (L) 3.87 - 5.11 MIL/uL   Hemoglobin 7.3 (L) 12.0 - 15.0 g/dL   HCT 60.1 (L) 09.3 - 23.5 %   MCV 102.3 (H) 80.0 - 100.0 fL   MCH 34.3 (H) 26.0 - 34.0 pg   MCHC 33.5 30.0 - 36.0 g/dL   RDW 57.3 (H) 22.0 - 25.4 %   Platelets 210 150 - 400 K/uL   nRBC 0.0 0.0 - 0.2 %    Comment: Performed at [facility], [address], [place], [place] [address]  Basic metabolic panel     Status: Abnormal   Collection Time: 07/03/23  4:30 AM  Result Value Ref Range   Sodium 136 135 - 145 mmol/L   Potassium 4.4 3.5 - 5.1 mmol/L   Chloride 103 98 - 111 mmol/L   CO2 26 22 - 32 mmol/L   Glucose, Bld 110 (H) 70 - 99 mg/dL    Comment: Glucose reference range applies only to samples taken after fasting for at least 8 hours.   BUN 20 8 - 23 mg/dL   Creatinine, Ser 3.76 (H) 0.44 - 1.00 mg/dL   Calcium 8.6 (L) 8.9 - 10.3 mg/dL   GFR, Estimated 30 (L) >60 mL/min    Comment: (NOTE) Calculated using the CKD-EPI Creatinine Equation (2021)    Anion gap 7 5 - 15    Comment: Performed at [facility], [address] [place], [place] [address]  CBC with Differential/Platelet     Status: Abnormal   Collection Time: 07/09/23  3:30 PM  Result Value Ref Range   WBC 10.0 3.8 - 10.8 Thousand/uL   RBC 2.57 (L) 3.80 - 5.10 Million/uL   Hemoglobin 8.3 (L) 11.7  - 15.5 g/dL   HCT 17.6 (L) 16.0 - 73.7 %   MCV 102.7 (H) 80.0 - 100.0 fL   MCH 32.3 27.0 - 33.0 pg   MCHC 31.4 (L) 32.0 - 36.0 g/dL    Comment: For adults, a slight decrease in the calculated MCHC value (in the range of 30 to 32 g/dL) is most likely not clinically significant; however, it should be interpreted with caution in correlation with other red cell parameters and the patient's clinical condition.    RDW 14.0 11.0 - 15.0 %   Platelets 391 140 - 400 Thousand/uL   MPV 10.9 7.5 - 12.5 fL   Neutro Abs 7,500 1,500 - 7,800 cells/uL   Absolute Lymphocytes 1,670 850 - 3,900 cells/uL   Absolute Monocytes 570 200 - 950 cells/uL   Eosinophils Absolute 200  15 - 500 cells/uL   Basophils Absolute 60 0 - 200 cells/uL   Neutrophils Relative % 75 %   Total Lymphocyte 16.7 %   Monocytes Relative 5.7 %   Eosinophils Relative 2.0 %   Basophils Relative 0.6 %    Radiology No results found.  Assessment/Plan  No problem-specific Assessment & Plan notes found for this encounter.  Essential hypertension blood pressure control important in reducing the progression of atherosclerotic disease. On appropriate oral medications.     Hyperlipidemia lipid control important in reducing the progression of atherosclerotic disease. Continue statin therapy  [name], MD  08/21/2023 1:30 PM    This note was created with Dragon medical transcription system.  Any errors from dictation are purely unintentional

## 2023-08-21 NOTE — Assessment & Plan Note (Signed)
 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  Seems to be doing well.  To be checked later this year.

## 2023-08-22 ENCOUNTER — Other Ambulatory Visit: Payer: Self-pay | Admitting: *Deleted

## 2023-08-22 ENCOUNTER — Telehealth: Payer: Self-pay | Admitting: *Deleted

## 2023-08-22 DIAGNOSIS — D649 Anemia, unspecified: Secondary | ICD-10-CM

## 2023-08-22 NOTE — Telephone Encounter (Signed)
 Cbc/hold tube entered. Per scheduling patient can be scheduled tomorrow for blood transfusion and pt could be type/screen today. Pt contacted by scheduling. Pt declined apt for today, tom and Friday due to transportation. She is uncertain whether she can even come on Monday and will call our office back.  I called Victorino Dike, CMA at [facility] office to let the office know patient has not yet been scheduled due to patient's declining apt. Per Victorino Dike, pt relies on her daughter for transportation. Victorino Dike requested that when pt gets scheduled to call her back to update her.

## 2023-08-22 NOTE — Telephone Encounter (Signed)
 She can come for cbc and hold tube for possible transfusion and can hopefully get transfusion this week

## 2023-08-22 NOTE — Telephone Encounter (Signed)
 According to the Dr. Sheran Lawless called already to see if she can get a transfusion. He had Christine Burnett her CMA and wanted to get transfusion for the pt. Hgb is 7.9 today. Her next appt is 09/03/2023 3:15

## 2023-08-24 DIAGNOSIS — N184 Chronic kidney disease, stage 4 (severe): Secondary | ICD-10-CM | POA: Diagnosis not present

## 2023-08-24 DIAGNOSIS — K922 Gastrointestinal hemorrhage, unspecified: Secondary | ICD-10-CM | POA: Diagnosis not present

## 2023-08-24 DIAGNOSIS — J449 Chronic obstructive pulmonary disease, unspecified: Secondary | ICD-10-CM | POA: Diagnosis not present

## 2023-08-24 DIAGNOSIS — F330 Major depressive disorder, recurrent, mild: Secondary | ICD-10-CM | POA: Diagnosis not present

## 2023-08-24 DIAGNOSIS — I129 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 stage 4 chronic kidney disease, or unspecified chronic kidney disease: Secondary | ICD-10-CM | POA: Diagnosis not present

## 2023-08-24 DIAGNOSIS — I739 Peripheral vascular disease, unspecified: Secondary | ICD-10-CM | POA: Diagnosis not present

## 2023-08-24 NOTE — Assessment & Plan Note (Signed)
 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  The left carotid artery has velocities just into the 40 to 59% range on the left.  She is doing very well.  She will continue her dual antiplatelet therapy with aspirin and Brilinta.  She will continue Lipitor.  We will follow-up in 6 months with noninvasive studies.

## 2023-08-27 ENCOUNTER — Telehealth: Payer: Self-pay | Admitting: *Deleted

## 2023-08-27 ENCOUNTER — Other Ambulatory Visit: Payer: Self-pay | Admitting: Oncology

## 2023-08-27 ENCOUNTER — Inpatient Hospital Stay: Attending: Radiation Oncology

## 2023-08-27 ENCOUNTER — Other Ambulatory Visit: Payer: Self-pay

## 2023-08-27 DIAGNOSIS — F419 Anxiety disorder, unspecified: Secondary | ICD-10-CM | POA: Insufficient documentation

## 2023-08-27 DIAGNOSIS — D649 Anemia, unspecified: Secondary | ICD-10-CM

## 2023-08-27 DIAGNOSIS — J449 Chronic obstructive pulmonary disease, unspecified: Secondary | ICD-10-CM | POA: Diagnosis not present

## 2023-08-27 DIAGNOSIS — K922 Gastrointestinal hemorrhage, unspecified: Secondary | ICD-10-CM | POA: Diagnosis not present

## 2023-08-27 DIAGNOSIS — N2581 Secondary hyperparathyroidism of renal origin: Secondary | ICD-10-CM | POA: Diagnosis not present

## 2023-08-27 DIAGNOSIS — D631 Anemia in chronic kidney disease: Secondary | ICD-10-CM | POA: Insufficient documentation

## 2023-08-27 DIAGNOSIS — I129 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 stage 4 chronic kidney disease, or unspecified chronic kidney disease: Secondary | ICD-10-CM | POA: Diagnosis not present

## 2023-08-27 DIAGNOSIS — F330 Major depressive disorder, recurrent, mild: Secondary | ICD-10-CM | POA: Diagnosis not present

## 2023-08-27 DIAGNOSIS — Z7982 Long term (current) use of aspirin: Secondary | ICD-10-CM | POA: Insufficient documentation

## 2023-08-27 DIAGNOSIS — N184 Chronic kidney disease, stage 4 (severe): Secondary | ICD-10-CM | POA: Diagnosis not present

## 2023-08-27 DIAGNOSIS — N183 Chronic kidney disease, stage 3 unspecified: Secondary | ICD-10-CM | POA: Diagnosis not present

## 2023-08-27 DIAGNOSIS — Z85118 Personal history of other malignant neoplasm of bronchus and lung: Secondary | ICD-10-CM | POA: Insufficient documentation

## 2023-08-27 DIAGNOSIS — E611 Iron deficiency: Secondary | ICD-10-CM | POA: Insufficient documentation

## 2023-08-27 DIAGNOSIS — Z79899 Other long term (current) drug therapy: Secondary | ICD-10-CM | POA: Insufficient documentation

## 2023-08-27 DIAGNOSIS — I739 Peripheral vascular disease, unspecified: Secondary | ICD-10-CM | POA: Diagnosis not present

## 2023-08-27 LAB — CBC WITH DIFFERENTIAL (CANCER CENTER ONLY)
Abs Immature Granulocytes: 0.04 10*3/uL (ref 0.00–0.07)
Basophils Absolute: 0.1 10*3/uL (ref 0.0–0.1)
Basophils Relative: 1 %
Eosinophils Absolute: 0.3 10*3/uL (ref 0.0–0.5)
Eosinophils Relative: 4 %
HCT: 26.6 % — ABNORMAL LOW (ref 36.0–46.0)
Hemoglobin: 8 g/dL — ABNORMAL LOW (ref 12.0–15.0)
Immature Granulocytes: 1 %
Lymphocytes Relative: 13 %
Lymphs Abs: 0.9 10*3/uL (ref 0.7–4.0)
MCH: 29.9 pg (ref 26.0–34.0)
MCHC: 30.1 g/dL (ref 30.0–36.0)
MCV: 99.3 fL (ref 80.0–100.0)
Monocytes Absolute: 0.5 10*3/uL (ref 0.1–1.0)
Monocytes Relative: 8 %
Neutro Abs: 4.8 10*3/uL (ref 1.7–7.7)
Neutrophils Relative %: 73 %
Platelet Count: 220 10*3/uL (ref 150–400)
RBC: 2.68 MIL/uL — ABNORMAL LOW (ref 3.87–5.11)
RDW: 15.1 % (ref 11.5–15.5)
WBC Count: 6.4 10*3/uL (ref 4.0–10.5)
nRBC: 0 % (ref 0.0–0.2)

## 2023-08-27 LAB — SAMPLE TO BLOOD BANK

## 2023-08-27 LAB — PREPARE RBC (CROSSMATCH)

## 2023-08-27 NOTE — Telephone Encounter (Signed)
 Received msg from Dr. Smith Robert. "nephrology wanted her to have transfusion. todays hb is 8. I would like to hold off on transfusion unless nephrology really wants her to have it or if she is terribly symptomatic can you check with nephrology and patient and let me know?"  I called and left a vm for Victorino Dike at Dr. Garnett Farm office. I personally reached out to patient directly. Pt stated that she has worsening shortness of breath and weakness. She prefers to go ahead and have blood transfusion as scheduled tomorrow. I gave this message back to Dr. Smith Robert - Per Dr. Smith Robert - "transfuse a unit of prbc tomorrow but I would like her ferritin and iron studies to be drawn tomorrow before giving her the unit"  Lab orders placed. Pt understands to come tomorrow for labs at 1245 and unit of blood tomorrow at 1pm.

## 2023-08-28 ENCOUNTER — Other Ambulatory Visit: Payer: Self-pay | Admitting: Oncology

## 2023-08-28 ENCOUNTER — Inpatient Hospital Stay

## 2023-08-28 ENCOUNTER — Other Ambulatory Visit: Payer: Self-pay

## 2023-08-28 DIAGNOSIS — D649 Anemia, unspecified: Secondary | ICD-10-CM

## 2023-08-28 DIAGNOSIS — Z85118 Personal history of other malignant neoplasm of bronchus and lung: Secondary | ICD-10-CM | POA: Diagnosis not present

## 2023-08-28 DIAGNOSIS — E611 Iron deficiency: Secondary | ICD-10-CM | POA: Diagnosis not present

## 2023-08-28 DIAGNOSIS — N2581 Secondary hyperparathyroidism of renal origin: Secondary | ICD-10-CM | POA: Diagnosis not present

## 2023-08-28 DIAGNOSIS — F419 Anxiety disorder, unspecified: Secondary | ICD-10-CM | POA: Diagnosis not present

## 2023-08-28 DIAGNOSIS — D631 Anemia in chronic kidney disease: Secondary | ICD-10-CM | POA: Diagnosis not present

## 2023-08-28 DIAGNOSIS — N183 Chronic kidney disease, stage 3 unspecified: Secondary | ICD-10-CM | POA: Diagnosis not present

## 2023-08-28 LAB — CBC WITH DIFFERENTIAL (CANCER CENTER ONLY)
Abs Immature Granulocytes: 0.03 10*3/uL (ref 0.00–0.07)
Basophils Absolute: 0.1 10*3/uL (ref 0.0–0.1)
Basophils Relative: 1 %
Eosinophils Absolute: 0.2 10*3/uL (ref 0.0–0.5)
Eosinophils Relative: 3 %
HCT: 26.5 % — ABNORMAL LOW (ref 36.0–46.0)
Hemoglobin: 7.9 g/dL — ABNORMAL LOW (ref 12.0–15.0)
Immature Granulocytes: 1 %
Lymphocytes Relative: 11 %
Lymphs Abs: 0.6 10*3/uL — ABNORMAL LOW (ref 0.7–4.0)
MCH: 29.5 pg (ref 26.0–34.0)
MCHC: 29.8 g/dL — ABNORMAL LOW (ref 30.0–36.0)
MCV: 98.9 fL (ref 80.0–100.0)
Monocytes Absolute: 0.3 10*3/uL (ref 0.1–1.0)
Monocytes Relative: 6 %
Neutro Abs: 4.3 10*3/uL (ref 1.7–7.7)
Neutrophils Relative %: 78 %
Platelet Count: 236 10*3/uL (ref 150–400)
RBC: 2.68 MIL/uL — ABNORMAL LOW (ref 3.87–5.11)
RDW: 15.1 % (ref 11.5–15.5)
WBC Count: 5.5 10*3/uL (ref 4.0–10.5)
nRBC: 0 % (ref 0.0–0.2)

## 2023-08-28 LAB — COMPREHENSIVE METABOLIC PANEL
ALT: 12 U/L (ref 0–44)
AST: 19 U/L (ref 15–41)
Albumin: 3.5 g/dL (ref 3.5–5.0)
Alkaline Phosphatase: 93 U/L (ref 38–126)
Anion gap: 9 (ref 5–15)
BUN: 30 mg/dL — ABNORMAL HIGH (ref 8–23)
CO2: 23 mmol/L (ref 22–32)
Calcium: 9.5 mg/dL (ref 8.9–10.3)
Chloride: 107 mmol/L (ref 98–111)
Creatinine, Ser: 1.57 mg/dL — ABNORMAL HIGH (ref 0.44–1.00)
GFR, Estimated: 33 mL/min — ABNORMAL LOW (ref >60)
Glucose, Bld: 119 mg/dL — ABNORMAL HIGH (ref 70–99)
Potassium: 4.2 mmol/L (ref 3.5–5.1)
Sodium: 139 mmol/L (ref 135–145)
Total Bilirubin: 0.4 mg/dL (ref 0.0–1.2)
Total Protein: 7.6 g/dL (ref 6.5–8.1)

## 2023-08-28 LAB — RETICULOCYTES
Immature Retic Fract: 24.9 % — ABNORMAL HIGH (ref 2.3–15.9)
RBC.: 2.65 MIL/uL — ABNORMAL LOW (ref 3.87–5.11)
Retic Count, Absolute: 79.5 10*3/uL (ref 19.0–186.0)
Retic Ct Pct: 3 % (ref 0.4–3.1)

## 2023-08-28 LAB — IRON AND TIBC
Iron: 28 ug/dL (ref 28–170)
Saturation Ratios: 7 % — ABNORMAL LOW (ref 10.4–31.8)
TIBC: 414 ug/dL (ref 250–450)
UIBC: 386 ug/dL

## 2023-08-28 LAB — FERRITIN: Ferritin: 16 ng/mL (ref 11–307)

## 2023-08-28 LAB — VITAMIN B12: Vitamin B-12: 786 pg/mL (ref 180–914)

## 2023-08-28 LAB — TSH: TSH: 2.571 u[IU]/mL (ref 0.350–4.500)

## 2023-08-28 LAB — LACTATE DEHYDROGENASE: LDH: 138 U/L (ref 98–192)

## 2023-08-28 MED ORDER — SODIUM CHLORIDE 0.9% IV SOLUTION
250.0000 mL | INTRAVENOUS | Status: DC
  Administered 2023-08-28: 100 mL via INTRAVENOUS
  Filled 2023-08-28: qty 250

## 2023-08-28 MED ORDER — ACETAMINOPHEN 325 MG PO TABS
650.0000 mg | ORAL_TABLET | Freq: Once | ORAL | Status: AC
  Administered 2023-08-28: 650 mg via ORAL
  Filled 2023-08-28: qty 2

## 2023-08-28 NOTE — Patient Instructions (Signed)
 Blood Transfusion, Adult, Care After The following information offers guidance on how to care for yourself after your procedure. Your health care provider may also give you more specific instructions. If you have problems or questions, contact your health care provider. What can I expect after the procedure? After the procedure, it is common to have: Bruising and soreness where the IV was inserted. A headache. Follow these instructions at home: IV insertion site care     Follow instructions from your health care provider about how to take care of your IV insertion site. Make sure you: Wash your hands with soap and water for at least 20 seconds before and after you change your bandage (dressing). If soap and water are not available, use hand sanitizer. Change your dressing as told by your health care provider. Check your IV insertion site every day for signs of infection. Check for: Redness, swelling, or pain. Bleeding from the site. Warmth. Pus or a bad smell. General instructions Take over-the-counter and prescription medicines only as told by your health care provider. Rest as told by your health care provider. Return to your normal activities as told by your health care provider. Keep all follow-up visits. Lab tests may need to be done at certain periods to recheck your blood counts. Contact a health care provider if: You have itching or red, swollen areas of skin (hives). You have a fever or chills. You have pain in the head, back, or chest. You feel anxious or you feel weak after doing your normal activities. You have redness, swelling, warmth, or pain around the IV insertion site. You have blood coming from the IV insertion site that does not stop with pressure. You have pus or a bad smell coming from your IV insertion site. If you received your blood transfusion in an outpatient setting, you will be told whom to contact to report any reactions. Get help right away if: You  have symptoms of a serious allergic or immune system reaction, including: Trouble breathing or shortness of breath. Swelling of the face, feeling flushed, or widespread rash. Dark urine or blood in the urine. Fast heartbeat. These symptoms may be an emergency. Get help right away. Call 911. Do not wait to see if the symptoms will go away. Do not drive yourself to the hospital. Summary Bruising and soreness around the IV insertion site are common. Check your IV insertion site every day for signs of infection. Rest as told by your health care provider. Return to your normal activities as told by your health care provider. Get help right away for symptoms of a serious allergic or immune system reaction to the blood transfusion. This information is not intended to replace advice given to you by your health care provider. Make sure you discuss any questions you have with your health care provider. Document Revised: 08/19/2021 Document Reviewed: 08/19/2021 Elsevier Patient Education  2024 [employer]  Blood Transfusion, Adult A blood transfusion is a procedure in which you receive blood or a type of blood cell (blood component) through an IV. You may need a blood transfusion when you have a low blood count, which is a low number of any blood cell. This may result from a bleeding disorder, illness, injury, or surgery. The blood may come from a donor, or you may be able to have your own blood collected and stored (autologous blood donation) before a planned surgery. The blood given in a transfusion may be made up of different blood components. You may  receive: Red blood cells. These carry oxygen to the cells in the body. Platelets. These help your blood to clot. Plasma. This is the liquid part of your blood. It carries proteins and other substances throughout the body. White blood cells. These help you fight infections. If you have hemophilia or another clotting disorder, you may also receive  other types of blood products. Depending on the type of blood product, this procedure may take 1-4 hours to complete. Tell a health care provider about: Any bleeding problems you have. Any previous reactions you have had during a blood transfusion. Any allergies you have. All medicines you are taking, including vitamins, herbs, eye drops, creams, and over-the-counter medicines. Any surgeries you have had. Any medical conditions you have. Whether you are pregnant or may be pregnant. What are the risks? Talk with your health care provider about risks. The most common problems include: A mild allergic reaction, such as red, swollen areas of skin (hives) and itching. Fever or chills. This may be the body's response to new blood cells received. This may occur during or up to 4 hours after the transfusion. More serious problems may include: A serious allergic reaction that causes difficulty breathing or swelling around the face and lips. Transfusion-associated circulatory overload (TACO), or too much fluid in the lungs. This may cause breathing problems. Transfusion-related acute lung injury (TRALI), which causes breathing difficulty and low oxygen in the blood. This can occur within hours of the transfusion or several days later. Iron overload. This can happen after receiving many blood transfusions over a period of time. Infection or virus being transmitted. This is rare because donated blood is carefully tested before it is given. Hemolytic transfusion reaction. This is rare. It happens when the body's defense system (immune system)tries to attack the new blood cells. Symptoms may include fever, chills, nausea, low blood pressure, and low back or chest pain. Transfusion-associated graft-versus-host disease (TAGVHD). This is rare. It happens when donated cells attack the body's healthy tissues. What happens before the procedure? You will have a blood test to check your blood type. This test is  done to know what kind of blood your body will accept and to match it to the donor blood. If you are going to have a planned surgery, you may be able to do an autologous blood donation. This may be done in case you need to have a transfusion. You will have your temperature, blood pressure, and pulse checked before the transfusion. If you have had an allergic reaction to a transfusion in the past, you may be given medicine to help prevent a reaction. This medicine may be given to you by mouth (orally) or through an IV. What happens during the procedure?  An IV will be inserted into one of your veins. The bag of blood will be attached to your IV. The blood will then enter through your vein. Your temperature, blood pressure, and pulse will be monitored during the transfusion. This monitoring is done to detect early signs of a transfusion reaction. Tell your nurse right away if you have any of these symptoms during the transfusion: Shortness of breath or trouble breathing. Chest or back pain. Fever or chills. Itching or hives. If you have any signs or symptoms of a reaction, your transfusion will be stopped and you may be given medicine. When the transfusion is complete, your IV will be removed. Pressure may be applied to the IV site for a few minutes. A bandage (dressing)will be applied. The  procedure may vary among health care providers and hospitals. What happens after the procedure? Your temperature, blood pressure, pulse, breathing rate, and blood oxygen level will be monitored until you leave the hospital or clinic. Your blood may be tested to see how you have responded to the transfusion. You may be warmed with fluids or blankets to maintain a normal body temperature. If you receive your blood transfusion in an outpatient setting, you will be told whom to contact to report any reactions. Where to find more information Visit the [employer]: redcross.org Summary A blood  transfusion is a procedure in which you receive blood or a type of blood cell (blood component) through an IV. The blood given in a transfusion may be made up of different blood components. You may receive red blood cells, platelets, plasma, or white blood cells depending on the condition treated. Your temperature, blood pressure, and pulse will be monitored before, during, and after the transfusion. After the transfusion, your blood may be tested to see how your body has responded. This information is not intended to replace advice given to you by your health care provider. Make sure you discuss any questions you have with your health care provider. Document Revised: 08/19/2021 Document Reviewed: 08/19/2021 Elsevier Patient Education  2024 [employer].

## 2023-08-29 ENCOUNTER — Other Ambulatory Visit: Payer: Self-pay | Admitting: Oncology

## 2023-08-29 ENCOUNTER — Telehealth: Payer: Self-pay

## 2023-08-29 DIAGNOSIS — D508 Other iron deficiency anemias: Secondary | ICD-10-CM

## 2023-08-29 DIAGNOSIS — D509 Iron deficiency anemia, unspecified: Secondary | ICD-10-CM | POA: Insufficient documentation

## 2023-08-29 LAB — TYPE AND SCREEN
ABO/RH(D): O POS
Antibody Screen: NEGATIVE
Unit division: 0

## 2023-08-29 LAB — KAPPA/LAMBDA LIGHT CHAINS
Kappa free light chain: 63.1 mg/L — ABNORMAL HIGH (ref 3.3–19.4)
Kappa, lambda light chain ratio: 1.36 (ref 0.26–1.65)
Lambda free light chains: 46.5 mg/L — ABNORMAL HIGH (ref 5.7–26.3)

## 2023-08-29 LAB — BPAM RBC
Blood Product Expiration Date: 202504202359
ISSUE DATE / TIME: 202503251425
Unit Type and Rh: 202504202359
Unit Type and Rh: 5100

## 2023-08-29 LAB — HAPTOGLOBIN: Haptoglobin: 199 mg/dL (ref 41–333)

## 2023-08-29 NOTE — Telephone Encounter (Signed)
 Spoke to patient who agreed to come in for iron infusions.  Informed scheduling will be in touch shortly to schedule; patient verbalized understanding.

## 2023-08-30 LAB — MULTIPLE MYELOMA PANEL, SERUM
Albumin SerPl Elph-Mcnc: 3.4 g/dL (ref 2.9–4.4)
Albumin/Glob SerPl: 1 (ref 0.7–1.7)
Alpha 1: 0.3 g/dL (ref 0.0–0.4)
Alpha2 Glob SerPl Elph-Mcnc: 0.9 g/dL (ref 0.4–1.0)
B-Globulin SerPl Elph-Mcnc: 1.1 g/dL (ref 0.7–1.3)
Gamma Glob SerPl Elph-Mcnc: 1.4 g/dL (ref 0.4–1.8)
Globulin, Total: 3.7 g/dL (ref 2.2–3.9)
IgA: 321 mg/dL (ref 64–422)
IgG (Immunoglobin G), Serum: 1336 mg/dL (ref 586–1602)
IgM (Immunoglobulin M), Srm: 180 mg/dL (ref 26–217)
Total Protein ELP: 7.1 g/dL (ref 6.0–8.5)

## 2023-09-03 ENCOUNTER — Inpatient Hospital Stay

## 2023-09-03 ENCOUNTER — Inpatient Hospital Stay: Admitting: Oncology

## 2023-09-03 ENCOUNTER — Other Ambulatory Visit: Payer: Self-pay

## 2023-09-03 ENCOUNTER — Inpatient Hospital Stay (HOSPITAL_BASED_OUTPATIENT_CLINIC_OR_DEPARTMENT_OTHER): Admitting: Oncology

## 2023-09-03 ENCOUNTER — Encounter: Payer: Self-pay | Admitting: Oncology

## 2023-09-03 VITALS — BP 151/82 | HR 103 | Temp 98.3°F | Resp 18 | Ht 66.0 in | Wt 126.0 lb

## 2023-09-03 VITALS — BP 159/77 | HR 103 | Resp 18

## 2023-09-03 DIAGNOSIS — Z85118 Personal history of other malignant neoplasm of bronchus and lung: Secondary | ICD-10-CM | POA: Diagnosis not present

## 2023-09-03 DIAGNOSIS — D509 Iron deficiency anemia, unspecified: Secondary | ICD-10-CM

## 2023-09-03 DIAGNOSIS — D631 Anemia in chronic kidney disease: Secondary | ICD-10-CM | POA: Diagnosis not present

## 2023-09-03 DIAGNOSIS — F330 Major depressive disorder, recurrent, mild: Secondary | ICD-10-CM | POA: Diagnosis not present

## 2023-09-03 DIAGNOSIS — E611 Iron deficiency: Secondary | ICD-10-CM | POA: Diagnosis not present

## 2023-09-03 DIAGNOSIS — I739 Peripheral vascular disease, unspecified: Secondary | ICD-10-CM | POA: Diagnosis not present

## 2023-09-03 DIAGNOSIS — F419 Anxiety disorder, unspecified: Secondary | ICD-10-CM | POA: Diagnosis not present

## 2023-09-03 DIAGNOSIS — N183 Chronic kidney disease, stage 3 unspecified: Secondary | ICD-10-CM

## 2023-09-03 DIAGNOSIS — N184 Chronic kidney disease, stage 4 (severe): Secondary | ICD-10-CM | POA: Diagnosis not present

## 2023-09-03 DIAGNOSIS — D508 Other iron deficiency anemias: Secondary | ICD-10-CM

## 2023-09-03 DIAGNOSIS — D649 Anemia, unspecified: Secondary | ICD-10-CM

## 2023-09-03 DIAGNOSIS — J449 Chronic obstructive pulmonary disease, unspecified: Secondary | ICD-10-CM | POA: Diagnosis not present

## 2023-09-03 DIAGNOSIS — N2581 Secondary hyperparathyroidism of renal origin: Secondary | ICD-10-CM | POA: Diagnosis not present

## 2023-09-03 DIAGNOSIS — I129 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 stage 4 chronic kidney disease, or unspecified chronic kidney disease: Secondary | ICD-10-CM | POA: Diagnosis not present

## 2023-09-03 DIAGNOSIS — K922 Gastrointestinal hemorrhage, unspecified: Secondary | ICD-10-CM | POA: Diagnosis not present

## 2023-09-03 LAB — CBC WITH DIFFERENTIAL/PLATELET
Abs Immature Granulocytes: 0.03 10*3/uL (ref 0.00–0.07)
Basophils Absolute: 0 10*3/uL (ref 0.0–0.1)
Basophils Relative: 1 %
Eosinophils Absolute: 0.1 10*3/uL (ref 0.0–0.5)
Eosinophils Relative: 2 %
HCT: 31.4 % — ABNORMAL LOW (ref 36.0–46.0)
Hemoglobin: 9.6 g/dL — ABNORMAL LOW (ref 12.0–15.0)
Immature Granulocytes: 1 %
Lymphocytes Relative: 12 %
Lymphs Abs: 0.8 10*3/uL (ref 0.7–4.0)
MCH: 29.7 pg (ref 26.0–34.0)
MCHC: 30.6 g/dL (ref 30.0–36.0)
MCV: 97.2 fL (ref 80.0–100.0)
Monocytes Absolute: 0.4 10*3/uL (ref 0.1–1.0)
Monocytes Relative: 7 %
Neutro Abs: 5.1 10*3/uL (ref 1.7–7.7)
Neutrophils Relative %: 77 %
Platelets: 224 10*3/uL (ref 150–400)
RBC: 3.23 MIL/uL — ABNORMAL LOW (ref 3.87–5.11)
RDW: 14.9 % (ref 11.5–15.5)
WBC: 6.6 10*3/uL (ref 4.0–10.5)
nRBC: 0 % (ref 0.0–0.2)

## 2023-09-03 MED ORDER — SODIUM CHLORIDE 0.9 % IV SOLN
1000.0000 mg | Freq: Once | INTRAVENOUS | Status: AC
  Administered 2023-09-03: 1000 mg via INTRAVENOUS
  Filled 2023-09-03: qty 1000

## 2023-09-03 MED ORDER — SODIUM CHLORIDE 0.9 % IV SOLN
Freq: Once | INTRAVENOUS | Status: AC
  Filled 2023-09-03: qty 250

## 2023-09-03 NOTE — Progress Notes (Signed)
 Hematology/Oncology Consult note [facility]  [phone] [phone]  Patient Care Team: Danelle Berry, PA-C as PCP - General (Family Medicine) Debbe Odea, MD as PCP - Cardiology (Cardiology) Mady Haagensen, MD as Consulting Physician (Nephrology) Glory Buff, RN as Oncology Nurse Navigator Salena Saner, MD as Consulting Physician (Pulmonary Disease) Creig Hines, MD as Consulting Physician (Oncology) Carmina Miller, MD as Referring Physician (Radiation Oncology)   Name of the patient: Christine Burnett  [id number]  [birth year]   Date of visit: 09/03/23  Diagnosis-anemia of chronic kidney disease along with component 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  No hypermetabolic hilar or mediastinal adenopathy.  She received SBRT to the lesion in March 2022 and has been in remission since then.  CT chest abdomen and pelvis with contrast in November 2024 showed no evidence of recurrent or progressive disease.  She has now been referred to me for anemia of chronic kidney disease.  She required a unit of blood transfusion in March 2025 as well.  Interval history-patient currently reports ongoing fatigue.  Denies any blood loss in her stool or urine although she reports having passed bright red blood in her stools back in December 2024 when she was admitted for an episode of diverticular bleed.  She also states that she bleeds especially in her legs when her cat scratches her.  ECOG PS- 3 Pain scale- 0   Review of systems- Review of Systems  Constitutional:  Positive for malaise/fatigue. Negative for chills, fever and weight loss.  HENT:  Negative for congestion, ear discharge  and nosebleeds.   Eyes:  Negative for blurred vision.  Respiratory:  Negative for cough, hemoptysis, sputum production, shortness of breath and wheezing.   Cardiovascular:  Negative for chest pain, palpitations, orthopnea and claudication.  Gastrointestinal:  Negative for abdominal pain, blood in stool, constipation, diarrhea, heartburn, melena, nausea and vomiting.  Genitourinary:  Negative for dysuria, flank pain, frequency, hematuria and urgency.  Musculoskeletal:  Negative for back pain, joint pain and myalgias.  Skin:  Negative for rash.  Neurological:  Negative for dizziness, tingling, focal weakness, seizures, weakness and headaches.  Endo/Heme/Allergies:  Does not bruise/bleed easily.  Psychiatric/Behavioral:  Negative for depression and suicidal ideas. The patient does not have insomnia.       Allergies  Allergen Reactions   Zithromax [Azithromycin]    Cefuroxime Axetil Rash   Ciprofloxacin Nausea Only    Unknown   Codeine Other (See Comments)    Patient does not like how the medication makes you feel      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   Managed by nephrologist   Lower  GI bleed 2025   Non-small cell lung cancer (NSCLC) (HCC)    Osteoporosis    Oxygen deficiency Me   Risk for falls    Situational disturbance    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   tia'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 2   TIA (transient ischemic attack)      Past Surgical History:  Procedure Laterality Date   CAROTID PTA/STENT INTERVENTION Right 04/20/2023    Procedure: CAROTID PTA/STENT INTERVENTION;  Surgeon: Annice Needy, MD;  Location: [facility] INVASIVE CV LAB;  Service: Cardiovascular;  Laterality: Right;  Inominate Artery Stenosis requiring stenting   COLONOSCOPY  2016   COLONOSCOPY WITH PROPOFOL N/A 06/29/2023   Procedure: COLONOSCOPY WITH PROPOFOL;  Surgeon: Midge Minium, MD;  Location: [facility] ENDOSCOPY;  Service: Endoscopy;  Laterality: N/A;   ESOPHAGUS SURGERY     INTRAMEDULLARY (IM) NAIL INTERTROCHANTERIC Left 03/26/2017   Procedure: INTRAMEDULLARY (IM) NAIL INTERTROCHANTRIC;  Surgeon: Kennedy Bucker, MD;  Location: [facility] ORS;  Service: Orthopedics;  Laterality: Left;   LEFT HEART CATH AND CORONARY ANGIOGRAPHY N/A 04/15/2021   Procedure: LEFT HEART CATH AND CORONARY ANGIOGRAPHY;  Surgeon: Iran Ouch, MD;  Location: [facility] INVASIVE CV LAB;  Service: Cardiovascular;  Laterality: N/A;   RIGHT/LEFT HEART CATH AND CORONARY ANGIOGRAPHY Bilateral 05/15/2022   Procedure: RIGHT/LEFT HEART CATH AND CORONARY ANGIOGRAPHY;  Surgeon: Iran Ouch, MD;  Location: [facility] INVASIVE CV LAB;  Service: Cardiovascular;  Laterality: Bilateral;   VIDEO BRONCHOSCOPY WITH ENDOBRONCHIAL NAVIGATION Right 06/28/2020   Procedure: VIDEO BRONCHOSCOPY WITH ENDOBRONCHIAL NAVIGATION CELLVIZIO;  Surgeon: Salena Saner, MD;  Location: [facility] ORS;  Service: Pulmonary;  Laterality: Right;    Social History   Socioeconomic History   Marital status: Divorced    Spouse name: Not on file   Number of children: 4   Years of education: Not on file   Highest education level: 12th grade  Occupational History   Occupation: Retired    Comment: payroll  Tobacco Use   Smoking status: Former    Current packs/day: 0.00    Average packs/day: 1 pack/day for 61.0 years (61.0 ttl pk-yrs)    Types: Cigarettes    Start date: 03/25/1956    Quit date: 03/25/2017    Years since quitting: 6.4   Smokeless tobacco: Never  Vaping Use   Vaping status: Never Used  Substance and Sexual  Activity   Alcohol use: No   Drug use: No   Sexual activity: Not Currently  Other Topics Concern   Not on file  Social History Narrative   Full Code   4 children   Denies alcohol use   Patient has 1 daughters and 1 grandson living with her   Social Drivers of Corporate investment banker Strain: Low Risk  (05/24/2023)   Overall Financial Resource Strain (CARDIA)    Difficulty of Paying Living Expenses: Not hard at all  Food Insecurity: No Food Insecurity (07/04/2023)   Hunger Vital Sign    Worried About Running Out of Food in the Last Year: Never true    Ran Out of Food in the Last Year: Never true  Transportation Needs: No Transportation Needs (07/04/2023)   PRAPARE - Administrator, Civil Service (Medical): No    Lack of Transportation (Non-Medical): No  Physical Activity: Insufficiently Active (05/24/2023)   Exercise Vital Sign    Days of Exercise per Week: 2 days    Minutes of Exercise per Session: 30 min  Stress: No Stress Concern Present (05/24/2023)   [employer] of  Occupational Health - Occupational Stress Questionnaire    Feeling of Stress : Not at all  Recent Concern: Stress - Stress Concern Present (04/02/2023)   [employer] of Occupational Health - Occupational Stress Questionnaire    Feeling of Stress : To some extent  Social Connections: Moderately Integrated (06/29/2023)   Social Connection and Isolation Panel [NHANES]    Frequency of Communication with Friends and Family: More than three times a week    Frequency of Social Gatherings with Friends and Family: More than three times a week    Attends Religious Services: More than 4 times per year    Active Member of [employer] or Organizations: Yes    Attends Banker Meetings: More than 4 times per year    Marital Status: Widowed  Intimate Partner Violence: Not At Risk (07/04/2023)   Humiliation, Afraid, Rape, and Kick questionnaire    Fear of Current or Ex-Partner: No     Emotionally Abused: No    Physically Abused: No    Sexually Abused: No    Family History  Problem Relation Age of Onset   Other Mother        [place] posioning   Kidney disease Mother    Heart attack Father    Prostate cancer Father    Cancer Father    Heart disease Father    Heart attack Brother    Diabetes Brother    Hypertension Daughter    Kidney cancer Neg Hx    Bladder Cancer Neg Hx    Colon cancer Neg Hx    Colon polyps Neg Hx    Rectal cancer Neg Hx      Current Outpatient Medications:    albuterol (VENTOLIN HFA) 108 (90 Base) MCG/ACT inhaler, Inhale 2 puffs into the lungs every 4 (four) hours as needed for wheezing or shortness of breath., Disp: 8 g, Rfl: 1   aspirin 81 MG EC tablet, Take 81 mg by mouth at bedtime. Swallow whole., Disp: , Rfl:    atorvastatin (LIPITOR) 40 MG tablet, Take 1 tablet (40 mg total) by mouth at bedtime., Disp: 90 tablet, Rfl: 1   busPIRone (BUSPAR) 5 MG tablet, Take 1 tablet (5 mg total) by mouth 2 (two) times daily as needed (stress anxiety)., Disp: 60 tablet, Rfl: 1   calcium carbonate (TUMS - DOSED IN MG ELEMENTAL CALCIUM) 500 MG chewable tablet, Chew 1.5 tablets (300 mg of elemental calcium total) by mouth every Monday, Tuesday, Wednesday, Thursday, and Friday., Disp: , Rfl:    Cholecalciferol (VITAMIN D3) 50 MCG (2000 UT) TABS, Take 2,000 Units by mouth at bedtime., Disp: , Rfl:    ferrous sulfate 325 (65 FE) MG EC tablet, Take 1 tablet (325 mg total) by mouth every other day., Disp: 60 tablet, Rfl: 0   fluticasone furoate-vilanterol (BREO ELLIPTA) 100-25 MCG/ACT AEPB, Inhale 1 puff into the lungs daily., Disp: 1 each, Rfl: 11   Melatonin 10 MG TABS, Take 10 mg by mouth at bedtime., Disp: , Rfl:    Multiple Vitamin (MULTIVITAMIN) capsule, Take 1 capsule by mouth daily., Disp: , Rfl:    PARoxetine (PAXIL) 40 MG tablet, Take 1 tablet (40 mg total) by mouth every morning., Disp: 90 tablet, Rfl: 2   ticagrelor (BRILINTA) 90 MG TABS tablet,  Take 1 tablet (90 mg total) by mouth 2 (two) times daily., Disp: 60 tablet, Rfl: 2  Physical exam:  Vitals:   09/03/23 1500 09/03/23 1511  BP: (!) 167/90 (!) 151/82  Pulse: 100 Marland Kitchen)  103  Resp: 18   Temp: 98.3 F (36.8 C)   TempSrc: Tympanic   Weight: 126 lb (57.2 kg)   Height: 5\' 6"  (1.676 m)    Physical Exam Constitutional:      Comments: Sitting in a wheelchair.  Appears in no acute distress  Cardiovascular:     Rate and Rhythm: Normal rate and regular rhythm.     Heart sounds: Normal heart sounds.  Pulmonary:     Effort: Pulmonary effort is normal.     Breath sounds: Normal breath sounds.  Abdominal:     General: Bowel sounds are normal.     Palpations: Abdomen is soft.  Musculoskeletal:     Right lower leg: No edema.     Left lower leg: No edema.  Skin:    General: Skin is warm and dry.  Neurological:     Mental Status: She is alert and oriented to person, place, and time.      I have personally reviewed labs listed below:    Latest Ref Rng & Units 08/28/2023    1:24 PM  CMP  Glucose 70 - 99 mg/dL 409   BUN 8 - 23 mg/dL 30   Creatinine 8.11 - 1.00 mg/dL 9.14   Sodium 782 - 956 mmol/L 139   Potassium 3.5 - 5.1 mmol/L 4.2   Chloride 98 - 111 mmol/L 107   CO2 22 - 32 mmol/L 23   Calcium 8.9 - 10.3 mg/dL 9.5   Total Protein 6.5 - 8.1 g/dL 7.6   Total Bilirubin 0.0 - 1.2 mg/dL 0.4   Alkaline Phos 38 - 126 U/L 93   AST 15 - 41 U/L 19   ALT 0 - 44 U/L 12       Latest Ref Rng & Units 09/03/2023    2:55 PM  CBC  WBC 4.0 - 10.5 K/uL 6.6   Hemoglobin 12.0 - 15.0 g/dL 9.6   Hematocrit 21.3 - 46.0 % 31.4   Platelets 150 - 400 K/uL 224    I have personally reviewed Radiology images listed below: No images are attached to the encounter.  VAS US CAROTID Result Date: 08/23/2023 Carotid Arterial Duplex Study Patient Name:  HARLYN RATHMANN  Date of Exam:   08/21/2023 Medical Rec #: [id number]           Accession #:    [id number] Date of Birth: 1941-05-15            Patient Gender: F Patient Age:   66 years Exam Location:  [facility] Procedure:      VAS US CAROTID Referring Phys: [name] --------------------------------------------------------------------------------  Indications:       Carotid artery disease. Risk Factors:      Hypertension. Other Factors:     04/20/2023 PTA of right iliac artery and stent of right                    innominate artery. Comparison Study:  05/18/2023 Performing Technologist: Debbe Bales RVS  Examination Guidelines: A complete evaluation includes B-mode imaging, spectral Doppler, color Doppler, and power Doppler as needed of all accessible portions of each vessel. Bilateral testing is considered an integral part of a complete examination. Limited examinations for reoccurring indications may be performed as noted.  Right Carotid Findings: +----------+--------+--------+--------+------------------+--------+           PSV cm/sEDV cm/sStenosisPlaque DescriptionComments +----------+--------+--------+--------+------------------+--------+ CCA Prox  84      13                                         +----------+--------+--------+--------+------------------+--------+  CCA Mid   83      14                                         +----------+--------+--------+--------+------------------+--------+ CCA Distal63      15                                         +----------+--------+--------+--------+------------------+--------+ ICA Prox  77      20                                         +----------+--------+--------+--------+------------------+--------+ ICA Mid   81      27                                         +----------+--------+--------+--------+------------------+--------+ ICA Distal77      21                                         +----------+--------+--------+--------+------------------+--------+ ECA       129     9                                           +----------+--------+--------+--------+------------------+--------+ +----------+--------+-------+--------+-------------------+           PSV cm/sEDV cmsDescribeArm Pressure (mmHG) +----------+--------+-------+--------+-------------------+ Subclavian101                                        +----------+--------+-------+--------+-------------------+ +---------+--------+--+--------+--+ VertebralPSV cm/s53EDV cm/s11 +---------+--------+--+--------+--+  Left Carotid Findings: +----------+--------+--------+--------+------------------+--------+           PSV cm/sEDV cm/sStenosisPlaque DescriptionComments +----------+--------+--------+--------+------------------+--------+ CCA Prox  91      14                                         +----------+--------+--------+--------+------------------+--------+ CCA Mid   82      17                                         +----------+--------+--------+--------+------------------+--------+ CCA Distal62      18                                         +----------+--------+--------+--------+------------------+--------+ ICA Prox  140     36                                         +----------+--------+--------+--------+------------------+--------+ ICA Mid   116  30                                         +----------+--------+--------+--------+------------------+--------+ ICA Distal90      17                                         +----------+--------+--------+--------+------------------+--------+ ECA       205     15                                         +----------+--------+--------+--------+------------------+--------+ +----------+--------+--------+--------+-------------------+           PSV cm/sEDV cm/sDescribeArm Pressure (mmHG) +----------+--------+--------+--------+-------------------+ Subclavian114     0                                    +----------+--------+--------+--------+-------------------+ +---------+--------+--+--------+--+ VertebralPSV cm/s85EDV cm/s16 +---------+--------+--+--------+--+   Summary: Right Carotid: Velocities in the right ICA are consistent with a 1-39% stenosis. Left Carotid: Velocities in the left ICA are consistent with a 40-59% stenosis.               The ECA appears >50% stenosed. Vertebrals:  Bilateral vertebral arteries demonstrate antegrade flow. Subclavians: Normal flow hemodynamics were seen in bilateral subclavian              arteries. *See table(s) above for measurements and observations.  Electronically signed by [name] MD on 08/23/2023 at 3:44:35 PM.    Final      Assessment and plan- Patient is a 83 y.o. female referred for anemia of chronic Kidney disease  Results of blood work from 08/28/2023 showed H&H of 7.9/26.5.  White count and platelets were normal.  Ferritin levels were low at 16 with an iron saturation of 7%.  Haptoglobin normal LDH normal.  TSH normal.  Myeloma panel showed no M protein and serum free light chain ratio was normal although both kappa and free lambda light chains were elevated.  B12 levels normal.  Discussed that her anemia is partly driven by iron deficiency although kidney disease could also be contributing to it.  Goal is to keep her ferritin close to 100 with an iron saturation close to 20%.  She will therefore proceed with 1 dose of Monoferric today.  Discussed risks and benefits of IV iron including all but not limited to possible risk of infusion and anaphylactic reaction patient agrees and will proceed as planned today.  CBC ferritin and iron studies in 2 4 and 6 months and I will see him back in 6 months  With regards to her lung cancerShe had a CT scan in November 2024 which was unremarkable and did not show any evidence of recurrent or progressive disease.  I will consider getting a repeat CT scan when I see her next time   Visit Diagnosis 1. Iron  deficiency anemia, unspecified iron deficiency anemia type   2. Anemia of chronic kidney failure, stage 3 (moderate) (HCC)      Dr. Owens Shark, MD, MPH [facility] [phone] 09/03/2023 3:43 PM

## 2023-09-06 ENCOUNTER — Other Ambulatory Visit: Payer: Self-pay | Admitting: Obstetrics and Gynecology

## 2023-09-06 DIAGNOSIS — I2510 Atherosclerotic heart disease of native coronary artery without angina pectoris: Secondary | ICD-10-CM | POA: Diagnosis not present

## 2023-09-06 DIAGNOSIS — D539 Nutritional anemia, unspecified: Secondary | ICD-10-CM | POA: Diagnosis not present

## 2023-09-06 DIAGNOSIS — Z7982 Long term (current) use of aspirin: Secondary | ICD-10-CM | POA: Diagnosis not present

## 2023-09-06 DIAGNOSIS — Z9181 History of falling: Secondary | ICD-10-CM | POA: Diagnosis not present

## 2023-09-06 DIAGNOSIS — K5730 Diverticulosis of large intestine without perforation or abscess without bleeding: Secondary | ICD-10-CM | POA: Diagnosis not present

## 2023-09-06 DIAGNOSIS — Z8673 Personal history of transient ischemic attack (TIA), and cerebral infarction without residual deficits: Secondary | ICD-10-CM | POA: Diagnosis not present

## 2023-09-06 DIAGNOSIS — N2581 Secondary hyperparathyroidism of renal origin: Secondary | ICD-10-CM | POA: Diagnosis not present

## 2023-09-06 DIAGNOSIS — I739 Peripheral vascular disease, unspecified: Secondary | ICD-10-CM | POA: Diagnosis not present

## 2023-09-06 DIAGNOSIS — F419 Anxiety disorder, unspecified: Secondary | ICD-10-CM | POA: Diagnosis not present

## 2023-09-06 DIAGNOSIS — N184 Chronic kidney disease, stage 4 (severe): Secondary | ICD-10-CM | POA: Diagnosis not present

## 2023-09-06 DIAGNOSIS — Z9981 Dependence on supplemental oxygen: Secondary | ICD-10-CM | POA: Diagnosis not present

## 2023-09-06 DIAGNOSIS — B029 Zoster without complications: Secondary | ICD-10-CM | POA: Diagnosis not present

## 2023-09-06 DIAGNOSIS — Z85118 Personal history of other malignant neoplasm of bronchus and lung: Secondary | ICD-10-CM | POA: Diagnosis not present

## 2023-09-06 DIAGNOSIS — Z7951 Long term (current) use of inhaled steroids: Secondary | ICD-10-CM | POA: Diagnosis not present

## 2023-09-06 DIAGNOSIS — J449 Chronic obstructive pulmonary disease, unspecified: Secondary | ICD-10-CM | POA: Diagnosis not present

## 2023-09-06 DIAGNOSIS — E785 Hyperlipidemia, unspecified: Secondary | ICD-10-CM | POA: Diagnosis not present

## 2023-09-06 DIAGNOSIS — M8008XD Age-related osteoporosis with current pathological fracture, vertebra(e), subsequent encounter for fracture with routine healing: Secondary | ICD-10-CM | POA: Diagnosis not present

## 2023-09-06 DIAGNOSIS — I129 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 stage 4 chronic kidney disease, or unspecified chronic kidney disease: Secondary | ICD-10-CM | POA: Diagnosis not present

## 2023-09-06 DIAGNOSIS — F330 Major depressive disorder, recurrent, mild: Secondary | ICD-10-CM | POA: Diagnosis not present

## 2023-09-07 ENCOUNTER — Telehealth: Payer: Self-pay

## 2023-09-07 NOTE — Telephone Encounter (Signed)
 Copied from CRM [id number]. Topic: Clinical - Home Health Verbal Orders >> Sep 07, 2023 12:14 PM Antwanette L wrote: Caller/Agency: Arline Asp from [facility] Callback Number: [phone] Service Requested: Physical Therapy Frequency: 1x a week for the next 8 weeks. This will be a continuation Any new concerns about the patient? No

## 2023-09-11 DIAGNOSIS — J449 Chronic obstructive pulmonary disease, unspecified: Secondary | ICD-10-CM | POA: Diagnosis not present

## 2023-09-11 DIAGNOSIS — N2581 Secondary hyperparathyroidism of renal origin: Secondary | ICD-10-CM | POA: Diagnosis not present

## 2023-09-11 DIAGNOSIS — I739 Peripheral vascular disease, unspecified: Secondary | ICD-10-CM | POA: Diagnosis not present

## 2023-09-11 DIAGNOSIS — I129 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 stage 4 chronic kidney disease, or unspecified chronic kidney disease: Secondary | ICD-10-CM | POA: Diagnosis not present

## 2023-09-11 DIAGNOSIS — F330 Major depressive disorder, recurrent, mild: Secondary | ICD-10-CM | POA: Diagnosis not present

## 2023-09-11 DIAGNOSIS — N184 Chronic kidney disease, stage 4 (severe): Secondary | ICD-10-CM | POA: Diagnosis not present

## 2023-09-13 NOTE — Telephone Encounter (Signed)
 Verbal orders given

## 2023-09-21 DIAGNOSIS — N2581 Secondary hyperparathyroidism of renal origin: Secondary | ICD-10-CM | POA: Diagnosis not present

## 2023-09-21 DIAGNOSIS — I129 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 stage 4 chronic kidney disease, or unspecified chronic kidney disease: Secondary | ICD-10-CM | POA: Diagnosis not present

## 2023-09-21 DIAGNOSIS — F330 Major depressive disorder, recurrent, mild: Secondary | ICD-10-CM | POA: Diagnosis not present

## 2023-09-21 DIAGNOSIS — N184 Chronic kidney disease, stage 4 (severe): Secondary | ICD-10-CM | POA: Diagnosis not present

## 2023-09-21 DIAGNOSIS — J449 Chronic obstructive pulmonary disease, unspecified: Secondary | ICD-10-CM | POA: Diagnosis not present

## 2023-09-21 DIAGNOSIS — I739 Peripheral vascular disease, unspecified: Secondary | ICD-10-CM | POA: Diagnosis not present

## 2023-09-25 ENCOUNTER — Encounter: Payer: Self-pay | Admitting: Family Medicine

## 2023-09-25 ENCOUNTER — Ambulatory Visit (INDEPENDENT_AMBULATORY_CARE_PROVIDER_SITE_OTHER): Payer: Medicare Other | Admitting: Family Medicine

## 2023-09-25 VITALS — BP 132/80 | HR 100 | Temp 97.8°F | Resp 16 | Wt 124.0 lb

## 2023-09-25 DIAGNOSIS — F330 Major depressive disorder, recurrent, mild: Secondary | ICD-10-CM

## 2023-09-25 DIAGNOSIS — I739 Peripheral vascular disease, unspecified: Secondary | ICD-10-CM | POA: Diagnosis not present

## 2023-09-25 DIAGNOSIS — D509 Iron deficiency anemia, unspecified: Secondary | ICD-10-CM

## 2023-09-25 DIAGNOSIS — N1832 Chronic kidney disease, stage 3b: Secondary | ICD-10-CM | POA: Diagnosis not present

## 2023-09-25 DIAGNOSIS — D692 Other nonthrombocytopenic purpura: Secondary | ICD-10-CM | POA: Diagnosis not present

## 2023-09-25 DIAGNOSIS — J441 Chronic obstructive pulmonary disease with (acute) exacerbation: Secondary | ICD-10-CM | POA: Diagnosis not present

## 2023-09-25 DIAGNOSIS — C3491 Malignant neoplasm of unspecified part of right bronchus or lung: Secondary | ICD-10-CM | POA: Diagnosis not present

## 2023-09-25 DIAGNOSIS — E782 Mixed hyperlipidemia: Secondary | ICD-10-CM

## 2023-09-25 DIAGNOSIS — I10 Essential (primary) hypertension: Secondary | ICD-10-CM

## 2023-09-25 MED ORDER — BREZTRI AEROSPHERE 160-9-4.8 MCG/ACT IN AERO: 2.0000 | INHALATION_SPRAY | Freq: Two times a day (BID) | RESPIRATORY_TRACT | 11 refills | Status: DC

## 2023-09-25 MED ORDER — FERROUS SULFATE 325 (65 FE) MG PO TBEC: 325.0000 mg | DELAYED_RELEASE_TABLET | ORAL | 3 refills | Status: AC

## 2023-09-25 NOTE — Assessment & Plan Note (Signed)
 Anxiety and depressive Dx well controlled on current meds Paxil  and buspar  Phq9 and gad 7 reviewed and both neg and well controlled

## 2023-09-25 NOTE — Assessment & Plan Note (Signed)
 Per vascular On asa brilinta  and statin

## 2023-09-25 NOTE — Assessment & Plan Note (Signed)
 Compliant with meds, no SE, no myalgias, fatigue or jaundice Last lipids reviewed and LDL is at goal - will continue to check annually Continue statin, same dose and diet/lifestyle efforts

## 2023-09-25 NOTE — Assessment & Plan Note (Signed)
 Sx well controlled, no recent exacerbation Pt wishes to try breztri  for COPD instead of breo

## 2023-09-25 NOTE — Assessment & Plan Note (Signed)
 Managing with nephrology, reviewed last OV and labs with pt and daughter Last eGFR in our system is 42, encouraged pt to keep BP controlled to help protect renal function She had f/up with nephro in June Secondary hyperparathyroid and anemia of chronic disease r/t renal disease

## 2023-09-25 NOTE — Assessment & Plan Note (Signed)
 Htn stable, well controlled, BP at goal today per age BP Readings from Last 3 Encounters:  09/25/23 132/80  09/03/23 (!) 159/77  09/03/23 (!) 151/82

## 2023-09-25 NOTE — Progress Notes (Signed)
 Name: Christine Burnett   MRN: [id number]    DOB: 04/14/1941   Date:09/25/2023       Progress Note  Chief Complaint  Patient presents with   Medical Management of Chronic Issues    TIA, COPD, MDD follow-up     Subjective:   Christine Burnett is a 83 y.o. female, presents to clinic for routine follow up on chronic conditions  Primary care managed paxil , statins, and she recently had TIA, BRBPR, worse anemia, and is working with vascular, hem/onc and nephrology Here with her daughter today No other concerns   She has seen hematology monthly since last OV, doing iron infusion - IDA  Last H/H 9.6/31.4  CKD stage 3b sees nephrology - next appt is June, reviewed Feb OV and labs through care everywhere  On statin LDL was goal 42, on lipitor 40 mg daily, no changes recently no concerns Lab Results  Component Value Date   CHOL 103 04/18/2023   HDL 42 04/18/2023   LDLCALC 42 04/18/2023   TRIG 95 04/18/2023   CHOLHDL 2.5 04/18/2023   On DAPT with vascular - recent OV reviewed, they want her on antiplatelets x 1 year, she has bruising to arms, but denies other bleedings (no bloody nose, with brushing teeth, in urine or stool)  On paxil  and buspar  - mood good She did stop her paxil  for a week cause she thought it may have been causing hair loss and she felt awful but better now that she is back on the medications PHQ-9 reviewed today and negative, for well-controlled MDD    09/25/2023   11:19 AM 07/09/2023    2:46 PM 04/30/2023    2:17 PM  Depression screen PHQ 2/9  Decreased Interest 0 3 0  Down, Depressed, Hopeless 1 3 0  PHQ - 2 Score 1 6 0  Altered sleeping 0 0 0  Tired, decreased energy 1 3 0  Change in appetite 0 0 0  Feeling bad or failure about yourself  0 0 0  Trouble concentrating 0 0 0  Moving slowly or fidgety/restless 0 0 0  Suicidal thoughts 0 0 0  PHQ-9 Score 2 9 0  Difficult doing work/chores Not difficult at all  Not difficult at all      09/25/2023    11:22 AM 04/03/2023   11:19 AM 01/23/2023    1:10 PM 01/09/2023   11:40 AM  GAD 7 : Generalized Anxiety Score  Nervous, Anxious, on Edge 0 1 0 3  Control/stop worrying 0 1 0 3  Worry too much - different things 0 1 0 3  Trouble relaxing 0 1 0 3  Restless 0 1 0 3  Easily annoyed or irritable 1 1 0 3  Afraid - awful might happen 1 1 0 3  Total GAD 7 Score 2 7 0 21  Anxiety Difficulty Not difficult at all Somewhat difficult Not difficult at all Extremely difficult  GAD 7 reviewed as well and neg/well controlled anxiety sx        Current Outpatient Medications:    albuterol  (VENTOLIN  HFA) 108 (90 Base) MCG/ACT inhaler, Inhale 2 puffs into the lungs every 4 (four) hours as needed for wheezing or shortness of breath., Disp: 8 g, Rfl: 1   aspirin  81 MG EC tablet, Take 81 mg by mouth at bedtime. Swallow whole., Disp: , Rfl:    atorvastatin  (LIPITOR) 40 MG tablet, Take 1 tablet (40 mg total) by mouth at bedtime., Disp: 90 tablet,  Rfl: 1   busPIRone  (BUSPAR ) 5 MG tablet, Take 1 tablet (5 mg total) by mouth 2 (two) times daily as needed (stress anxiety)., Disp: 60 tablet, Rfl: 1   calcium  carbonate (TUMS - DOSED IN MG ELEMENTAL CALCIUM ) 500 MG chewable tablet, Chew 1.5 tablets (300 mg of elemental calcium  total) by mouth every Monday, Tuesday, Wednesday, Thursday, and Friday., Disp: , Rfl:    Cholecalciferol  (VITAMIN D3) 50 MCG (2000 UT) TABS, Take 2,000 Units by mouth at bedtime., Disp: , Rfl:    fluticasone  furoate-vilanterol (BREO ELLIPTA ) 100-25 MCG/ACT AEPB, Inhale 1 puff into the lungs daily., Disp: 1 each, Rfl: 11   Melatonin 10 MG TABS, Take 10 mg by mouth at bedtime., Disp: , Rfl:    Multiple Vitamin (MULTIVITAMIN) capsule, Take 1 capsule by mouth daily., Disp: , Rfl:    PARoxetine  (PAXIL ) 40 MG tablet, Take 1 tablet (40 mg total) by mouth every morning., Disp: 90 tablet, Rfl: 2   ticagrelor  (BRILINTA ) 90 MG TABS tablet, Take 1 tablet (90 mg total) by mouth 2 (two) times daily., Disp: 60  tablet, Rfl: 2   ferrous sulfate  325 (65 FE) MG EC tablet, Take 1 tablet (325 mg total) by mouth every other day. 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   PAD (peripheral artery disease) (HCC) 05/18/2023   Stenosis of right internal carotid artery 04/19/2023   Internal carotid artery stenosis, right 04/19/2023   Essential hypertension 04/18/2023   Dyslipidemia 04/18/2023   Anxiety and depression 04/18/2023   Chronic obstructive pulmonary disease (COPD) (HCC) 04/18/2023   TIA (transient ischemic attack) 04/17/2023   Inferior pubic ramus fracture, left, closed, initial encounter (HCC) 11/01/2022   Fall 11/01/2022   Closed fracture of left inferior pubic ramus, initial encounter (HCC) 11/01/2022   Sacral fracture, closed (HCC) 11/01/2022   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   Dyspnea on exertion 05/15/2022   Coronary artery disease    COPD (chronic obstructive pulmonary disease) (HCC) 01/28/2021   CKD (chronic kidney disease) stage 4, GFR 15-29 ml/min (HCC) 01/21/2021   Goals of care, counseling/discussion 07/17/2020   Malignant neoplasm of lower lobe of right lung (HCC) 07/04/2020   Nocturnal hypoxia 03/17/2020   Prediabetes 08/13/2018   Chest pain 09/27/2017   Allergic rhinitis 09/27/2017   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   Stage 3b chronic kidney disease (HCC) 08/31/2017   Hyperparathyroidism, 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   Osteoporosis without current pathological fracture 03/30/2017   Cardiac murmur 04/07/2015   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 stage 4 chronic kidney disease, or unspecified chronic kidney disease 02/10/2015   Anxiety 01/05/2015   Hyperlipidemia 01/05/2015   HTN (hypertension)  09/24/2014    Past Surgical History:  Procedure Laterality Date   CAROTID PTA/STENT INTERVENTION Right 04/20/2023   Procedure: CAROTID PTA/STENT INTERVENTION;  Surgeon: Celso College, MD;  Location: [facility] INVASIVE CV LAB;  Service: Cardiovascular;  Laterality: Right;  Inominate Artery Stenosis requiring stenting   COLONOSCOPY  2016   COLONOSCOPY WITH PROPOFOL  N/A 06/29/2023   Procedure: COLONOSCOPY WITH PROPOFOL ;  Surgeon: Marnee Sink, MD;  Location: [facility] ENDOSCOPY;  Service: Endoscopy;  Laterality: N/A;   ESOPHAGUS SURGERY     INTRAMEDULLARY (IM) NAIL INTERTROCHANTERIC Left 03/26/2017   Procedure: INTRAMEDULLARY (IM)  NAIL INTERTROCHANTRIC;  Surgeon: Molli Angelucci, MD;  Location: [facility] ORS;  Service: Orthopedics;  Laterality: Left;   LEFT HEART CATH AND CORONARY ANGIOGRAPHY N/A 04/15/2021   Procedure: LEFT HEART CATH AND CORONARY ANGIOGRAPHY;  Surgeon: Wenona Hamilton, MD;  Location: [facility] INVASIVE CV LAB;  Service: Cardiovascular;  Laterality: N/A;   RIGHT/LEFT HEART CATH AND CORONARY ANGIOGRAPHY Bilateral 05/15/2022   Procedure: RIGHT/LEFT HEART CATH AND CORONARY ANGIOGRAPHY;  Surgeon: Wenona Hamilton, MD;  Location: [facility] INVASIVE CV LAB;  Service: Cardiovascular;  Laterality: Bilateral;   VIDEO BRONCHOSCOPY WITH ENDOBRONCHIAL NAVIGATION Right 06/28/2020   Procedure: VIDEO BRONCHOSCOPY WITH ENDOBRONCHIAL NAVIGATION CELLVIZIO;  Surgeon: Marc Senior, MD;  Location: [facility] ORS;  Service: Pulmonary;  Laterality: Right;    Family History  Problem Relation Age of Onset   Other Mother        Uremeic posioning   Kidney disease Mother    Heart attack Father    Prostate cancer Father    Cancer Father    Heart disease Father    Heart attack Brother    Diabetes Brother    Hypertension Daughter    Kidney cancer Neg Hx    Bladder Cancer Neg Hx    Colon cancer Neg Hx    Colon polyps Neg Hx    Rectal cancer Neg Hx     Social History   Tobacco Use   Smoking status: Former    Current  packs/day: 0.00    Average packs/day: 1 pack/day for 61.0 years (61.0 ttl pk-yrs)    Types: Cigarettes    Start date: 03/25/1956    Quit date: 03/25/2017    Years since quitting: 6.5   Smokeless tobacco: Never  Vaping Use   Vaping status: Never Used  Substance Use Topics   Alcohol use: No   Drug use: No     Allergies  Allergen Reactions   Zithromax  [Azithromycin ]    Cefuroxime Axetil Rash   Ciprofloxacin  Nausea Only    Unknown   Codeine Other (See Comments)    Patient does not like how the medication makes you feel     Health Maintenance  Topic Date Due   COVID-19 Vaccine (1) Never done   Medicare Annual Wellness (AWV)  02/08/2022   Zoster Vaccines- Shingrix (1 of 2) 10/06/2023 (Originally 02/25/1960)   INFLUENZA VACCINE  01/04/2024   DEXA SCAN  10/25/2024   DTaP/Tdap/Td (2 - Td or Tdap) 05/30/2027   Pneumonia Vaccine 67+ Years old  Completed   HPV VACCINES  Aged Out   Meningococcal B Vaccine  Aged Out   Hepatitis C Screening  Discontinued    Chart Review Today: I personally reviewed active problem list, medication list, allergies, family history, social history, health maintenance, notes from last encounter, lab results, imaging with the patient/caregiver today.   Review of Systems  Constitutional: Negative.   HENT: Negative.    Eyes: Negative.   Respiratory: Negative.    Cardiovascular: Negative.   Gastrointestinal: Negative.   Endocrine: Negative.   Genitourinary: Negative.   Musculoskeletal: Negative.   Skin: Negative.   Allergic/Immunologic: Negative.   Neurological: Negative.   Hematological: Negative.   Psychiatric/Behavioral: Negative.    All other systems reviewed and are negative.    Objective:   Vitals:   09/25/23 1110  BP: 132/80  Pulse: 100  Resp: 16  Temp: 97.8 F (36.6 C)  TempSrc: Oral  SpO2: 96%  Weight: 124 lb (56.2 kg)    Body mass index is 20.01 kg/m.  Physical Exam Vitals and nursing note reviewed.  Constitutional:       General: She is not in acute distress.    Appearance: Normal appearance. She is well-developed. She is not ill-appearing, toxic-appearing or diaphoretic.  HENT:     Head: Normocephalic and atraumatic.     Nose: Nose normal.  Eyes:     General:        Right eye: No discharge.        Left eye: No discharge.     Conjunctiva/sclera: Conjunctivae normal.  Neck:     Trachea: No tracheal deviation.  Cardiovascular:     Rate and Rhythm: Normal rate.     Pulses: Normal pulses.     Heart sounds: Normal heart sounds. No murmur heard.    No gallop.  Pulmonary:     Effort: Pulmonary effort is normal. No respiratory distress.     Breath sounds: Normal breath sounds. No stridor. No wheezing, rhonchi or rales.  Musculoskeletal:     Right lower leg: No edema.     Left lower leg: No edema.  Skin:    General: Skin is warm and dry.     Findings: Bruising (to bilateral arms/forearms) present. No rash.  Neurological:     Mental Status: She is alert.     Motor: No abnormal muscle tone.     Coordination: Coordination normal.     Gait: Gait abnormal.  Psychiatric:        Mood and Affect: Mood normal.        Behavior: Behavior normal.        Results for orders placed or performed in visit on 09/03/23  CBC with Differential/Platelet   Collection Time: 09/03/23  2:55 PM  Result Value Ref Range   WBC 6.6 4.0 - 10.5 K/uL   RBC 3.23 (L) 3.87 - 5.11 MIL/uL   Hemoglobin 9.6 (L) 12.0 - 15.0 g/dL   HCT 16.1 (L) 09.6 - 04.5 %   MCV 97.2 80.0 - 100.0 fL   MCH 29.7 26.0 - 34.0 pg   MCHC 30.6 30.0 - 36.0 g/dL   RDW 40.9 81.1 - 91.4 %   Platelets 224 150 - 400 K/uL   nRBC 0.0 0.0 - 0.2 %   Neutrophils Relative % 77 %   Neutro Abs 5.1 1.7 - 7.7 K/uL   Lymphocytes Relative 12 %   Lymphs Abs 0.8 0.7 - 4.0 K/uL   Monocytes Relative 7 %   Monocytes Absolute 0.4 0.1 - 1.0 K/uL   Eosinophils Relative 2 %   Eosinophils Absolute 0.1 0.0 - 0.5 K/uL   Basophils Relative 1 %   Basophils Absolute 0.0 0.0 -  0.1 K/uL   Immature Granulocytes 1 %   Abs Immature Granulocytes 0.03 0.00 - 0.07 K/uL      Assessment & Plan:   Chronic obstructive pulmonary disease with acute exacerbation (HCC) Assessment & Plan: Sx well controlled, no recent exacerbation Pt wishes to try breztri  for COPD instead of breo  Orders: -     Breztri  Aerosphere; Inhale 2 puffs into the lungs 2 (two) times daily.  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  Dispense: 45 tablet; Refill: 3  Primary hypertension Assessment & Plan: Htn stable, well controlled, BP at goal today per age BP Readings from Last 3 Encounters:  09/25/23 132/80  09/03/23 (!) 159/77  09/03/23 (!) 151/82      Non-small cell cancer of right lung [facility]) Assessment & Plan: Tx with oncology/radiation Last oncology note reviewed   Senile purpura [facility]) Assessment & Plan: On antiplatelet - reassured pt that bruising to arms is not dangerous for her, no other bleeding concerns at this time   Mixed hyperlipidemia Assessment & Plan: Compliant with meds, no SE, no myalgias, fatigue or jaundice Last lipids reviewed and LDL is at goal  - will continue to check annually Continue statin, same dose and diet/lifestyle efforts    PAD (peripheral artery disease) (HCC) Assessment & Plan: Per vascular On asa brilinta  and statin      Return in about 6 months (around 03/26/2024) for Routine follow-up HLD, anxiety/mood refills . Needs MWV scheduled - overdue   Adeline Hone, PA-C 09/25/23 11:32 AM

## 2023-09-25 NOTE — Assessment & Plan Note (Signed)
 On antiplatelet - reassured pt that bruising to arms is not dangerous for her, no other bleeding concerns at this time

## 2023-09-25 NOTE — Assessment & Plan Note (Signed)
 Acute blood loss a few months ago and anemia of chronic disease  Being managed by hematology and nephrology

## 2023-09-25 NOTE — Assessment & Plan Note (Signed)
 Tx with oncology/radiation Last oncology note reviewed

## 2023-09-26 DIAGNOSIS — I739 Peripheral vascular disease, unspecified: Secondary | ICD-10-CM | POA: Diagnosis not present

## 2023-09-26 DIAGNOSIS — F330 Major depressive disorder, recurrent, mild: Secondary | ICD-10-CM | POA: Diagnosis not present

## 2023-09-26 DIAGNOSIS — I129 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 stage 4 chronic kidney disease, or unspecified chronic kidney disease: Secondary | ICD-10-CM | POA: Diagnosis not present

## 2023-09-26 DIAGNOSIS — N2581 Secondary hyperparathyroidism of renal origin: Secondary | ICD-10-CM | POA: Diagnosis not present

## 2023-09-26 DIAGNOSIS — J449 Chronic obstructive pulmonary disease, unspecified: Secondary | ICD-10-CM | POA: Diagnosis not present

## 2023-09-26 DIAGNOSIS — N184 Chronic kidney disease, stage 4 (severe): Secondary | ICD-10-CM | POA: Diagnosis not present

## 2023-10-03 DIAGNOSIS — N184 Chronic kidney disease, stage 4 (severe): Secondary | ICD-10-CM | POA: Diagnosis not present

## 2023-10-03 DIAGNOSIS — F330 Major depressive disorder, recurrent, mild: Secondary | ICD-10-CM | POA: Diagnosis not present

## 2023-10-03 DIAGNOSIS — I129 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 stage 4 chronic kidney disease, or unspecified chronic kidney disease: Secondary | ICD-10-CM | POA: Diagnosis not present

## 2023-10-03 DIAGNOSIS — I739 Peripheral vascular disease, unspecified: Secondary | ICD-10-CM | POA: Diagnosis not present

## 2023-10-03 DIAGNOSIS — N2581 Secondary hyperparathyroidism of renal origin: Secondary | ICD-10-CM | POA: Diagnosis not present

## 2023-10-03 DIAGNOSIS — J449 Chronic obstructive pulmonary disease, unspecified: Secondary | ICD-10-CM | POA: Diagnosis not present

## 2023-10-06 DIAGNOSIS — Z9981 Dependence on supplemental oxygen: Secondary | ICD-10-CM | POA: Diagnosis not present

## 2023-10-06 DIAGNOSIS — D539 Nutritional anemia, unspecified: Secondary | ICD-10-CM | POA: Diagnosis not present

## 2023-10-06 DIAGNOSIS — Z7982 Long term (current) use of aspirin: Secondary | ICD-10-CM | POA: Diagnosis not present

## 2023-10-06 DIAGNOSIS — Z9181 History of falling: Secondary | ICD-10-CM | POA: Diagnosis not present

## 2023-10-06 DIAGNOSIS — I739 Peripheral vascular disease, unspecified: Secondary | ICD-10-CM | POA: Diagnosis not present

## 2023-10-06 DIAGNOSIS — E785 Hyperlipidemia, unspecified: Secondary | ICD-10-CM | POA: Diagnosis not present

## 2023-10-06 DIAGNOSIS — B029 Zoster without complications: Secondary | ICD-10-CM | POA: Diagnosis not present

## 2023-10-06 DIAGNOSIS — F419 Anxiety disorder, unspecified: Secondary | ICD-10-CM | POA: Diagnosis not present

## 2023-10-06 DIAGNOSIS — Z85118 Personal history of other malignant neoplasm of bronchus and lung: Secondary | ICD-10-CM | POA: Diagnosis not present

## 2023-10-06 DIAGNOSIS — I129 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 stage 4 chronic kidney disease, or unspecified chronic kidney disease: Secondary | ICD-10-CM | POA: Diagnosis not present

## 2023-10-06 DIAGNOSIS — N184 Chronic kidney disease, stage 4 (severe): Secondary | ICD-10-CM | POA: Diagnosis not present

## 2023-10-06 DIAGNOSIS — I2510 Atherosclerotic heart disease of native coronary artery without angina pectoris: Secondary | ICD-10-CM | POA: Diagnosis not present

## 2023-10-06 DIAGNOSIS — M8008XD Age-related osteoporosis with current pathological fracture, vertebra(e), subsequent encounter for fracture with routine healing: Secondary | ICD-10-CM | POA: Diagnosis not present

## 2023-10-06 DIAGNOSIS — Z8673 Personal history of transient ischemic attack (TIA), and cerebral infarction without residual deficits: Secondary | ICD-10-CM | POA: Diagnosis not present

## 2023-10-06 DIAGNOSIS — F330 Major depressive disorder, recurrent, mild: Secondary | ICD-10-CM | POA: Diagnosis not present

## 2023-10-06 DIAGNOSIS — J449 Chronic obstructive pulmonary disease, unspecified: Secondary | ICD-10-CM | POA: Diagnosis not present

## 2023-10-06 DIAGNOSIS — Z7951 Long term (current) use of inhaled steroids: Secondary | ICD-10-CM | POA: Diagnosis not present

## 2023-10-06 DIAGNOSIS — N2581 Secondary hyperparathyroidism of renal origin: Secondary | ICD-10-CM | POA: Diagnosis not present

## 2023-10-06 DIAGNOSIS — K5730 Diverticulosis of large intestine without perforation or abscess without bleeding: Secondary | ICD-10-CM | POA: Diagnosis not present

## 2023-10-07 ENCOUNTER — Other Ambulatory Visit: Payer: Self-pay | Admitting: Family Medicine

## 2023-10-07 DIAGNOSIS — F419 Anxiety disorder, unspecified: Secondary | ICD-10-CM

## 2023-10-07 DIAGNOSIS — F330 Major depressive disorder, recurrent, mild: Secondary | ICD-10-CM

## 2023-10-08 DIAGNOSIS — J449 Chronic obstructive pulmonary disease, unspecified: Secondary | ICD-10-CM | POA: Diagnosis not present

## 2023-10-08 DIAGNOSIS — I129 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 stage 4 chronic kidney disease, or unspecified chronic kidney disease: Secondary | ICD-10-CM | POA: Diagnosis not present

## 2023-10-08 DIAGNOSIS — N2581 Secondary hyperparathyroidism of renal origin: Secondary | ICD-10-CM | POA: Diagnosis not present

## 2023-10-08 DIAGNOSIS — F330 Major depressive disorder, recurrent, mild: Secondary | ICD-10-CM | POA: Diagnosis not present

## 2023-10-08 DIAGNOSIS — N184 Chronic kidney disease, stage 4 (severe): Secondary | ICD-10-CM | POA: Diagnosis not present

## 2023-10-08 DIAGNOSIS — I739 Peripheral vascular disease, unspecified: Secondary | ICD-10-CM | POA: Diagnosis not present

## 2023-10-09 NOTE — Telephone Encounter (Signed)
 Requested Prescriptions  Refused Prescriptions Disp Refills   PARoxetine  (PAXIL ) 40 MG tablet [Pharmacy Med Name: PARoxetine  HCl 40 MG Oral Tablet] 90 tablet 0    Sig: TAKE 1 TABLET BY MOUTH ONCE DAILY IN THE MORNING     Psychiatry:  Antidepressants - SSRI Passed - 10/09/2023  2:30 PM      Passed - Completed PHQ-2 or PHQ-9 in the last 360 days      Passed - Valid encounter within last 6 months    Recent Outpatient Visits           2 weeks ago Chronic obstructive pulmonary disease with acute exacerbation [facility])   [facility] Adeline Hone, PA-C   2 months ago TIA (transient ischemic attack)   [facility] Adeline Hone, PA-C   3 months ago Hospital discharge follow-up   [facility] Adeline Hone, [place]       Future Appointments             In 2 weeks Asberry Bjornstad, Bernardo Bridgeman, NP [facility] [place]   In 5 months Adeline Hone, PA-C [facility], [facility]

## 2023-10-13 ENCOUNTER — Other Ambulatory Visit (INDEPENDENT_AMBULATORY_CARE_PROVIDER_SITE_OTHER): Payer: Self-pay | Admitting: Vascular Surgery

## 2023-10-13 ENCOUNTER — Other Ambulatory Visit: Payer: Self-pay | Admitting: Physician Assistant

## 2023-10-13 DIAGNOSIS — E782 Mixed hyperlipidemia: Secondary | ICD-10-CM

## 2023-10-16 NOTE — Telephone Encounter (Signed)
 Requested Prescriptions  Pending Prescriptions Disp Refills   atorvastatin  (LIPITOR) 40 MG tablet [Pharmacy Med Name: Atorvastatin  Calcium  40 MG Oral Tablet] 90 tablet 0    Sig: TAKE 1 TABLET BY MOUTH AT BEDTIME     Cardiovascular:  Antilipid - Statins Failed - 10/16/2023 10:55 AM      Failed - Lipid Panel in normal range within the last 12 months    Cholesterol, Total  Date Value Ref Range Status  03/23/2021 113 100 - 199 mg/dL Final   Cholesterol  Date Value Ref Range Status  04/18/2023 103 0 - 200 mg/dL Final  16/03/9603 540 0 - 200 mg/dL Final   Ldl Cholesterol, Calc  Date Value Ref Range Status  07/03/2014 47 0 - 100 mg/dL Final   LDL Cholesterol (Calc)  Date Value Ref Range Status  08/01/2022 45 mg/dL (calc) Final    Comment:    Reference range: <100 . Desirable range <100 mg/dL for primary prevention;   <70 mg/dL for patients with CHD or diabetic patients  with > or = 2 CHD risk factors. Aaron Aas LDL-C is now calculated using the Martin-Hopkins  calculation, which is a validated novel method providing  better accuracy than the Friedewald equation in the  estimation of LDL-C.  Melinda Sprawls et al. Erroll Heard. 9811;914(78): 2061-2068  (http://education.[facility].com/faq/FAQ164)    LDL Cholesterol  Date Value Ref Range Status  04/18/2023 42 0 - 99 mg/dL Final    Comment:           Total Cholesterol/HDL:CHD Risk Coronary Heart Disease Risk Table                     Men   Women  1/2 Average Risk   3.4   3.3  Average Risk       5.0   4.4  2 X Average Risk   9.6   7.1  3 X Average Risk  23.4   11.0        Use the calculated Patient Ratio above and the CHD Risk Table to determine the patient's CHD Risk.        ATP III CLASSIFICATION (LDL):  <100     mg/dL   Optimal  295-621  mg/dL   Near or Above                    Optimal  130-159  mg/dL   Borderline  308-657  mg/dL   High  >846     mg/dL   Very High Performed at [facility], [address]  [place], [place] [address]    HDL Cholesterol  Date Value Ref Range Status  07/03/2014 33 (L) 40 - 60 mg/dL Final   HDL  Date Value Ref Range Status  04/18/2023 42 >40 mg/dL Final  28/41/3244 45 >01 mg/dL Final   Triglycerides  Date Value Ref Range Status  04/18/2023 95 <150 mg/dL Final  02/72/5366 440 (H) 0 - 200 mg/dL Final         Passed - Patient is not pregnant      Passed - Valid encounter within last 12 months    Recent Outpatient Visits           3 weeks ago Chronic obstructive pulmonary disease with acute exacerbation [facility])   [place] [facility] Adeline Hone, PA-C   2 months ago TIA (transient ischemic attack)   [facility] Adeline Hone, PA-C   3 months ago  Hospital discharge follow-up   [facility] Adeline Hone, [place]       Future Appointments             In 1 week Asberry Bjornstad, Bernardo Bridgeman, NP [facility] [place]   In 5 months Adeline Hone, PA-C [facility], [facility]

## 2023-10-19 DIAGNOSIS — I129 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 stage 4 chronic kidney disease, or unspecified chronic kidney disease: Secondary | ICD-10-CM | POA: Diagnosis not present

## 2023-10-19 DIAGNOSIS — I739 Peripheral vascular disease, unspecified: Secondary | ICD-10-CM | POA: Diagnosis not present

## 2023-10-19 DIAGNOSIS — N184 Chronic kidney disease, stage 4 (severe): Secondary | ICD-10-CM | POA: Diagnosis not present

## 2023-10-19 DIAGNOSIS — J449 Chronic obstructive pulmonary disease, unspecified: Secondary | ICD-10-CM | POA: Diagnosis not present

## 2023-10-19 DIAGNOSIS — N2581 Secondary hyperparathyroidism of renal origin: Secondary | ICD-10-CM | POA: Diagnosis not present

## 2023-10-19 DIAGNOSIS — F330 Major depressive disorder, recurrent, mild: Secondary | ICD-10-CM | POA: Diagnosis not present

## 2023-10-23 ENCOUNTER — Encounter (INDEPENDENT_AMBULATORY_CARE_PROVIDER_SITE_OTHER): Payer: Self-pay

## 2023-10-26 ENCOUNTER — Ambulatory Visit: Admitting: Student

## 2023-10-31 DIAGNOSIS — N184 Chronic kidney disease, stage 4 (severe): Secondary | ICD-10-CM | POA: Diagnosis not present

## 2023-10-31 DIAGNOSIS — I739 Peripheral vascular disease, unspecified: Secondary | ICD-10-CM | POA: Diagnosis not present

## 2023-10-31 DIAGNOSIS — F330 Major depressive disorder, recurrent, mild: Secondary | ICD-10-CM | POA: Diagnosis not present

## 2023-10-31 DIAGNOSIS — J449 Chronic obstructive pulmonary disease, unspecified: Secondary | ICD-10-CM | POA: Diagnosis not present

## 2023-10-31 DIAGNOSIS — N2581 Secondary hyperparathyroidism of renal origin: Secondary | ICD-10-CM | POA: Diagnosis not present

## 2023-10-31 DIAGNOSIS — I129 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 stage 4 chronic kidney disease, or unspecified chronic kidney disease: Secondary | ICD-10-CM | POA: Diagnosis not present

## 2023-11-02 ENCOUNTER — Other Ambulatory Visit: Admission: RE | Admit: 2023-11-02 | Source: Ambulatory Visit | Admitting: Oncology

## 2023-11-02 ENCOUNTER — Inpatient Hospital Stay: Attending: Radiation Oncology

## 2023-11-02 DIAGNOSIS — D509 Iron deficiency anemia, unspecified: Secondary | ICD-10-CM

## 2023-11-02 DIAGNOSIS — D631 Anemia in chronic kidney disease: Secondary | ICD-10-CM | POA: Insufficient documentation

## 2023-11-02 DIAGNOSIS — N183 Chronic kidney disease, stage 3 unspecified: Secondary | ICD-10-CM | POA: Insufficient documentation

## 2023-11-02 LAB — CBC WITH DIFFERENTIAL (CANCER CENTER ONLY)
Abs Immature Granulocytes: 0.03 10*3/uL (ref 0.00–0.07)
Basophils Absolute: 0 10*3/uL (ref 0.0–0.1)
Basophils Relative: 1 %
Eosinophils Absolute: 0.2 10*3/uL (ref 0.0–0.5)
Eosinophils Relative: 2 %
HCT: 35.6 % — ABNORMAL LOW (ref 36.0–46.0)
Hemoglobin: 11.5 g/dL — ABNORMAL LOW (ref 12.0–15.0)
Immature Granulocytes: 1 %
Lymphocytes Relative: 16 %
Lymphs Abs: 1 10*3/uL (ref 0.7–4.0)
MCH: 33.1 pg (ref 26.0–34.0)
MCHC: 32.3 g/dL (ref 30.0–36.0)
MCV: 102.6 fL — ABNORMAL HIGH (ref 80.0–100.0)
Monocytes Absolute: 0.4 10*3/uL (ref 0.1–1.0)
Monocytes Relative: 6 %
Neutro Abs: 4.7 10*3/uL (ref 1.7–7.7)
Neutrophils Relative %: 74 %
Platelet Count: 194 10*3/uL (ref 150–400)
RBC: 3.47 MIL/uL — ABNORMAL LOW (ref 3.87–5.11)
RDW: 18.6 % — ABNORMAL HIGH (ref 11.5–15.5)
WBC Count: 6.2 10*3/uL (ref 4.0–10.5)
nRBC: 0 % (ref 0.0–0.2)

## 2023-11-02 LAB — IRON AND TIBC
Iron: 100 ug/dL (ref 28–170)
Saturation Ratios: 36 % — ABNORMAL HIGH (ref 10.4–31.8)
TIBC: 277 ug/dL (ref 250–450)
UIBC: 177 ug/dL

## 2023-11-02 LAB — FERRITIN: Ferritin: 176 ng/mL (ref 11–307)

## 2023-11-19 DIAGNOSIS — N1832 Chronic kidney disease, stage 3b: Secondary | ICD-10-CM | POA: Diagnosis not present

## 2023-11-19 DIAGNOSIS — N2581 Secondary hyperparathyroidism of renal origin: Secondary | ICD-10-CM | POA: Diagnosis not present

## 2023-11-19 DIAGNOSIS — D631 Anemia in chronic kidney disease: Secondary | ICD-10-CM | POA: Diagnosis not present

## 2023-11-23 ENCOUNTER — Encounter (INDEPENDENT_AMBULATORY_CARE_PROVIDER_SITE_OTHER): Payer: Medicare Other

## 2023-11-23 ENCOUNTER — Ambulatory Visit (INDEPENDENT_AMBULATORY_CARE_PROVIDER_SITE_OTHER): Payer: Medicare Other | Admitting: Vascular Surgery

## 2024-01-03 ENCOUNTER — Inpatient Hospital Stay

## 2024-01-14 ENCOUNTER — Other Ambulatory Visit (INDEPENDENT_AMBULATORY_CARE_PROVIDER_SITE_OTHER): Payer: Self-pay | Admitting: Vascular Surgery

## 2024-02-05 ENCOUNTER — Other Ambulatory Visit: Payer: Self-pay | Admitting: Family Medicine

## 2024-02-05 DIAGNOSIS — E782 Mixed hyperlipidemia: Secondary | ICD-10-CM

## 2024-02-05 NOTE — Telephone Encounter (Signed)
 Requested Prescriptions  Pending Prescriptions Disp Refills   atorvastatin  (LIPITOR) 40 MG tablet [Pharmacy Med Name: Atorvastatin  Calcium  40 MG Oral Tablet] 90 tablet 0    Sig: TAKE 1 TABLET BY MOUTH AT BEDTIME     Cardiovascular:  Antilipid - Statins Failed - 02/05/2024  3:43 PM      Failed - Lipid Panel in normal range within the last 12 months    Cholesterol, Total  Date Value Ref Range Status  03/23/2021 113 100 - 199 mg/dL Final   Cholesterol  Date Value Ref Range Status  04/18/2023 103 0 - 200 mg/dL Final  98/70/7983 855 0 - 200 mg/dL Final   Ldl Cholesterol, Calc  Date Value Ref Range Status  07/03/2014 47 0 - 100 mg/dL Final   LDL Cholesterol (Calc)  Date Value Ref Range Status  08/01/2022 45 mg/dL (calc) Final    Comment:    Reference range: <100 . Desirable range <100 mg/dL for primary prevention;   <70 mg/dL for patients with CHD or diabetic patients  with > or = 2 CHD risk factors. SABRA LDL-C is now calculated using the Martin-Hopkins  calculation, which is a validated novel method providing  better accuracy than the Friedewald equation in the  estimation of LDL-C.  Gladis APPLETHWAITE et al. SANDREA. 7986;689(80): 2061-2068  (http://education.[facility].com/faq/FAQ164)    LDL Cholesterol  Date Value Ref Range Status  04/18/2023 42 0 - 99 mg/dL Final    Comment:           Total Cholesterol/HDL:CHD Risk Coronary Heart Disease Risk Table                     Men   Women  1/2 Average Risk   3.4   3.3  Average Risk       5.0   4.4  2 X Average Risk   9.6   7.1  3 X Average Risk  23.4   11.0        Use the calculated Patient Ratio above and the CHD Risk Table to determine the patient's CHD Risk.        ATP III CLASSIFICATION (LDL):  <100     mg/dL   Optimal  899-870  mg/dL   Near or Above                    Optimal  130-159  mg/dL   Borderline  839-810  mg/dL   High  >809     mg/dL   Very High Performed at [facility], [address]  [place], [place] [address]    HDL Cholesterol  Date Value Ref Range Status  07/03/2014 33 (L) 40 - 60 mg/dL Final   HDL  Date Value Ref Range Status  04/18/2023 42 >40 mg/dL Final  89/80/7977 45 >60 mg/dL Final   Triglycerides  Date Value Ref Range Status  04/18/2023 95 <150 mg/dL Final  98/70/7983 679 (H) 0 - 200 mg/dL Final         Passed - Patient is not pregnant      Passed - Valid encounter within last 12 months    Recent Outpatient Visits           4 months ago Chronic obstructive pulmonary disease with acute exacerbation [facility])   [place] [facility] Leavy Mole, PA-C   6 months ago TIA (transient ischemic attack)   [facility] Leavy Mole, PA-C   7 months  ago Hospital discharge follow-up   [facility] Leavy Mole, [place]       Future Appointments             In 1 month Leavy Mole, PA-C [facility], [place]

## 2024-02-12 ENCOUNTER — Ambulatory Visit (INDEPENDENT_AMBULATORY_CARE_PROVIDER_SITE_OTHER)

## 2024-02-12 ENCOUNTER — Encounter (INDEPENDENT_AMBULATORY_CARE_PROVIDER_SITE_OTHER): Payer: Self-pay | Admitting: Vascular Surgery

## 2024-02-12 ENCOUNTER — Ambulatory Visit (INDEPENDENT_AMBULATORY_CARE_PROVIDER_SITE_OTHER): Admitting: Vascular Surgery

## 2024-02-12 VITALS — BP 131/81 | HR 97 | Ht 66.0 in | Wt 121.0 lb

## 2024-02-12 DIAGNOSIS — I771 Stricture of artery: Secondary | ICD-10-CM

## 2024-02-12 DIAGNOSIS — E782 Mixed hyperlipidemia: Secondary | ICD-10-CM | POA: Diagnosis not present

## 2024-02-12 DIAGNOSIS — I739 Peripheral vascular disease, unspecified: Secondary | ICD-10-CM

## 2024-02-12 DIAGNOSIS — I10 Essential (primary) hypertension: Secondary | ICD-10-CM

## 2024-02-12 DIAGNOSIS — R1011 Right upper quadrant pain: Secondary | ICD-10-CM | POA: Insufficient documentation

## 2024-02-12 NOTE — Progress Notes (Signed)
 MRN : [id number]  Christine Burnett is a 83 y.o. ([birth year]) female who presents with chief complaint of  Chief Complaint  Patient presents with   Follow-up  .  History of Present Illness: Patient returns today in follow up of multiple vascular issues.  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  She has done well and not had any focal neurologic symptoms since that time.  As part of that procedure we had to perform iliac angioplasty for significant iliac artery occlusive disease as well.  She is not currently having any lifestyle limiting claudication, ischemic rest pain, or ulceration of the lower extremities. Duplex today showed normal velocities in both carotid arteries as well as normal flow hemodynamics seen in both subclavian arteries with bilateral antegrade vertebral arteries.  This would be consistent with a widely patent innominate artery stent and no significant cervical carotid disease.  ABIs were also done today showing a right ABI of 1.0 and a left ABI of 1.08 with normal digital pressures and waveforms and normal triphasic lower extremity waveforms consistent with maintained perfusion distally after previous iliac angioplasty. One of her bigger complaints today is of right sided abdominal pain.  She has a bulge in her right mid abdominal area.  She says she notices this with straining or getting up.  It is painful to her but not dramatically so.  She is concerned of what is causing this pain.  Current Outpatient Medications  Medication Sig Dispense Refill   albuterol  (VENTOLIN  HFA) 108 (90 Base) MCG/ACT inhaler Inhale 2 puffs into the lungs every 4 (four) hours as needed for wheezing or shortness of breath. 8 g 1   aspirin  81 MG EC tablet Take 81 mg by mouth at bedtime. Swallow whole.     atorvastatin  (LIPITOR) 40 MG tablet TAKE 1 TABLET BY MOUTH AT BEDTIME 90 tablet 0   BRILINTA  90 MG TABS tablet Take 1 tablet by  mouth twice daily 60 tablet 3   budeson-glycopyrrolate-formoterol (BREZTRI  AEROSPHERE) 160-9-4.8 MCG/ACT AERO inhaler Inhale 2 puffs into the lungs 2 (two) times daily. 10.7 g 11   busPIRone  (BUSPAR ) 5 MG tablet Take 1 tablet (5 mg total) by mouth 2 (two) times daily as needed (stress anxiety). 60 tablet 1   calcium  carbonate (TUMS - DOSED IN MG ELEMENTAL CALCIUM ) 500 MG chewable tablet Chew 1.5 tablets (300 mg of elemental calcium  total) by mouth every Monday, Tuesday, Wednesday, Thursday, and Friday.     Cholecalciferol  (VITAMIN D3) 50 MCG (2000 UT) TABS Take 2,000 Units by mouth at bedtime.     ferrous sulfate  325 (65 FE) MG EC tablet Take 1 tablet (325 mg total) by mouth every other day. 45 tablet 3   Melatonin 10 MG TABS Take 10 mg by mouth at bedtime.     Multiple Vitamin (MULTIVITAMIN) capsule Take 1 capsule by mouth daily.     PARoxetine  (PAXIL ) 40 MG tablet Take 1 tablet (40 mg total) by mouth every morning. 90 tablet 2   No current facility-administered medications for this visit.    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   Managed by nephrologist   Lower GI bleed 2025   Non-small cell lung cancer (NSCLC) (HCC)    Osteoporosis    Oxygen  deficiency Me   Risk for falls    Situational disturbance    Stroke [facility]) 2011   tia's x 2   TIA (transient ischemic attack)     Past Surgical History:  Procedure Laterality Date   CAROTID PTA/STENT INTERVENTION Right 04/20/2023   Procedure: CAROTID PTA/STENT  INTERVENTION;  Surgeon: Marea Selinda RAMAN, MD;  Location: [facility] INVASIVE CV LAB;  Service: Cardiovascular;  Laterality: Right;  Inominate Artery Stenosis requiring stenting   COLONOSCOPY  2016   COLONOSCOPY WITH PROPOFOL  N/A 06/29/2023   Procedure: COLONOSCOPY WITH PROPOFOL ;  Surgeon: Jinny Carmine, MD;  Location: [facility] ENDOSCOPY;  Service: Endoscopy;  Laterality: N/A;   ESOPHAGUS SURGERY     INTRAMEDULLARY (IM) NAIL INTERTROCHANTERIC Left 03/26/2017   Procedure: INTRAMEDULLARY (IM) NAIL INTERTROCHANTRIC;  Surgeon: Kathlynn Sharper, MD;  Location: [facility] ORS;  Service: Orthopedics;  Laterality: Left;   LEFT HEART CATH AND CORONARY ANGIOGRAPHY N/A 04/15/2021   Procedure: LEFT HEART CATH AND CORONARY ANGIOGRAPHY;  Surgeon: Darron Deatrice LABOR, MD;  Location: [facility] INVASIVE CV [facility];  Service: Cardiovascular;  Laterality: N/A;   RIGHT/LEFT HEART CATH AND CORONARY ANGIOGRAPHY Bilateral 05/15/2022   Procedure: RIGHT/LEFT HEART CATH AND CORONARY ANGIOGRAPHY;  Surgeon: Darron Deatrice LABOR, MD;  Location: [facility] INV[facility] CV [facility];  Service: Cardiovascular;  Laterality: Bilateral;   VIDEO BRONCHOSCOPY WITH ENDOBRONCHIAL NAVIGATION Right 06/28/2020   Procedure: VIDEO BRONCHOSCOPY WITH ENDOBRONCHIAL NAVIGATION CELLVIZIO;  Surgeon: Tamea Dedra CROME, MD;  Location: [facility] ORS;  Service: Pulmonary;  Laterality: Right;     Social History   Tobacco Use   Smoking status: Former    Current packs/day: 0.00    Average packs/day: 1 pack/day for 61.0 years (61.0 ttl pk-yrs)    Types: Cigarettes    Start date: 03/25/1956    Quit date: 03/25/2017    Years since quitting: 6.8   Smokeless tobacco: Never  Vaping Use   Vaping status: Never Used  Substance Use Topics   Alcohol use: No   Drug use: No      Family History  Problem Relation Age of Onset   Other Mother        Uremeic posioning   Kidney disease Mother    Heart attack Father    Prostate cancer Father    Cancer Father    Heart disease Father    Heart attack Brother     Diabetes Brother    Hypertension Daughter    Kidney cancer Neg Hx    Bladder Cancer Neg Hx    Colon cancer Neg Hx    Colon polyps Neg Hx    Rectal cancer Neg Hx      Allergies  Allergen Reactions   Zithromax  [Azithromycin ]    Cefuroxime Axetil Rash   Ciprofloxacin  Nausea Only    Unknown   Codeine Other (See Comments)    Patient does not like how the medication makes you feel      REVIEW OF SYSTEMS (Negative unless checked)   Constitutional: [] Weight loss  [] Fever  [] Chills Cardiac: [] Chest pain   [] Chest pressure   [] Palpitations   [] Shortness of breath when laying flat   [] Shortness of breath at rest   []   Shortness of breath with exertion. Vascular:  [x] Pain in legs with walking   [] Pain in legs at rest   [] Pain in legs when laying flat   [] Claudication   [] Pain in feet when walking  [] Pain in feet at rest  [] Pain in feet when laying flat   [] History of DVT   [] Phlebitis   [] Swelling in legs   [] Varicose veins   [] Non-healing ulcers Pulmonary:   [] Uses home oxygen    [] Productive cough   [] Hemoptysis   [] Wheeze  [] COPD   [] Asthma Neurologic:  [] Dizziness  [] Blackouts   [] Seizures   [] History of stroke   [x] History of TIA  [] Aphasia   [] Temporary blindness   [] Dysphagia   [] Weakness or numbness in arms   [] Weakness or numbness in legs Musculoskeletal:  [] Arthritis   [] Joint swelling   [] Joint pain   [] Low back pain Hematologic:  [x] Easy bruising  [] Easy bleeding   [] Hypercoagulable state   [] Anemic   Gastrointestinal:  [] Blood in stool   [] Vomiting blood  [] Gastroesophageal reflux/heartburn   [] Abdominal pain Genitourinary:  [x] Chronic kidney disease   [] Difficult urination  [] Frequent urination  [] Burning with urination   [] Hematuria Skin:  [] Rashes   [] Ulcers   [] Wounds Psychological:  [] History of anxiety   []  History of major depression.  Physical Examination  BP 131/81   Pulse 97   Ht 5' 6 (1.676 m)   Wt 121 lb (54.9 kg)   BMI 19.53 kg/m  Gen:  WD/WN, NAD Head:  Hawkins/AT, No temporalis wasting. Ear/Nose/Throat: Hearing grossly intact, nares w/o erythema or drainage Eyes: Conjunctiva clear. Sclera non-icteric Neck: Supple.  Trachea midline Pulmonary:  Good air movement, no use of accessory muscles.  Cardiac: somewhat tachycardic Vascular:  Vessel Right Left  Radial Palpable Palpable               Musculoskeletal: M/S 5/5 throughout.  No deformity or atrophy. No edema. Neurologic: Sensation grossly intact in extremities.  Symmetrical.  Speech is fluent.  Psychiatric: Judgment intact, Mood & affect appropriate for pt's clinical situation. Dermatologic: No rashes or ulcers noted.  No cellulitis or open wounds.      Labs No results found for this or any previous visit (from the past 2160 hours).  Radiology No results found.  Assessment/Plan  Innominate artery stenosis (HCC) Duplex today showed normal velocities in both carotid arteries as well as normal flow hemodynamics seen in both subclavian arteries with bilateral antegrade vertebral arteries.  This would be consistent with a widely patent innominate artery stent and no significant cervical carotid disease.  I would recommend continued Brilinta , aspirin , and Lipitor.  I would recommend recheck in approximately 6 months.  PAD (peripheral artery disease) (HCC) ABIs were also done today showing a right ABI of 1.0 and a left ABI of 1.08 with normal digital pressures and waveforms and normal triphasic lower extremity waveforms consistent with maintained perfusion distally after previous iliac angioplasty.  Continue current medical regimen.  Recheck at her next visit in 6 months and then we can likely go to once a year follow-up.  Abdominal pain, right upper quadrant The patient's pain is in the location of a potential spigelian hernia.  These are difficult to diagnose clinically at times.  We can consider getting a CT scan of the abdomen pelvis for further evaluation and considering referral to a  general surgeon.  She says she has had enough surgery for now and does not want to deal with this unless it becomes more painful which  is certainly reasonable.  If I can provide a referral please let me know.  Essential hypertension blood pressure control important in reducing the progression of atherosclerotic disease. On appropriate oral medications.     Hyperlipidemia lipid control important in reducing the progression of atherosclerotic disease. Continue statin therapy  Selinda Gu, MD  02/12/2024 3:17 PM    This note was created with Dragon medical transcription system.  Any errors from dictation are purely unintentional

## 2024-02-12 NOTE — Assessment & Plan Note (Signed)
 ABIs were also done today showing a right ABI of 1.0 and a left ABI of 1.08 with normal digital pressures and waveforms and normal triphasic lower extremity waveforms consistent with maintained perfusion distally after previous iliac angioplasty.  Continue current medical regimen.  Recheck at her next visit in 6 months and then we can likely go to once a year follow-up.

## 2024-02-12 NOTE — Assessment & Plan Note (Signed)
 The patient's pain is in the location of a potential spigelian hernia.  These are difficult to diagnose clinically at times.  We can consider getting a CT scan of the abdomen pelvis for further evaluation and considering referral to a general surgeon.  She says she has had enough surgery for now and does not want to deal with this unless it becomes more painful which is certainly reasonable.  If I can provide a referral please let me know.

## 2024-02-12 NOTE — Assessment & Plan Note (Signed)
 Duplex today showed normal velocities in both carotid arteries as well as normal flow hemodynamics seen in both subclavian arteries with bilateral antegrade vertebral arteries.  This would be consistent with a widely patent innominate artery stent and no significant cervical carotid disease.  I would recommend continued Brilinta , aspirin , and Lipitor.  I would recommend recheck in approximately 6 months.

## 2024-02-13 LAB — VAS US ABI WITH/WO TBI
Left ABI: 1.09
Right ABI: 1

## 2024-03-02 ENCOUNTER — Other Ambulatory Visit: Payer: Self-pay

## 2024-03-02 ENCOUNTER — Emergency Department

## 2024-03-02 ENCOUNTER — Encounter: Payer: Self-pay | Admitting: Emergency Medicine

## 2024-03-02 ENCOUNTER — Emergency Department
Admission: EM | Admit: 2024-03-02 | Discharge: 2024-03-02 | Disposition: A | Discharge status: Home / Self Care | Attending: Emergency Medicine | Admitting: Emergency Medicine

## 2024-03-02 DIAGNOSIS — N200 Calculus of kidney: Secondary | ICD-10-CM | POA: Diagnosis not present

## 2024-03-02 DIAGNOSIS — I714 Abdominal aortic aneurysm, without rupture, unspecified: Secondary | ICD-10-CM | POA: Diagnosis not present

## 2024-03-02 DIAGNOSIS — K429 Umbilical hernia without obstruction or gangrene: Secondary | ICD-10-CM | POA: Diagnosis not present

## 2024-03-02 DIAGNOSIS — R197 Diarrhea, unspecified: Secondary | ICD-10-CM | POA: Insufficient documentation

## 2024-03-02 DIAGNOSIS — R1030 Lower abdominal pain, unspecified: Secondary | ICD-10-CM | POA: Insufficient documentation

## 2024-03-02 DIAGNOSIS — K625 Hemorrhage of anus and rectum: Secondary | ICD-10-CM | POA: Diagnosis not present

## 2024-03-02 DIAGNOSIS — K5730 Diverticulosis of large intestine without perforation or abscess without bleeding: Secondary | ICD-10-CM | POA: Diagnosis not present

## 2024-03-02 LAB — CBC
HCT: 32.2 % — ABNORMAL LOW (ref 36.0–46.0)
Hemoglobin: 10.6 g/dL — ABNORMAL LOW (ref 12.0–15.0)
MCH: 34 pg (ref 26.0–34.0)
MCHC: 32.9 g/dL (ref 30.0–36.0)
MCV: 103.2 fL — ABNORMAL HIGH (ref 80.0–100.0)
Platelets: 210 K/uL (ref 150–400)
RBC: 3.12 MIL/uL — ABNORMAL LOW (ref 3.87–5.11)
RDW: 13.7 % (ref 11.5–15.5)
WBC: 6.5 K/uL (ref 4.0–10.5)
nRBC: 0 % (ref 0.0–0.2)

## 2024-03-02 LAB — COMPREHENSIVE METABOLIC PANEL WITH GFR
ALT: 15 U/L (ref 0–44)
AST: 22 U/L (ref 15–41)
Albumin: 3.4 g/dL — ABNORMAL LOW (ref 3.5–5.0)
Alkaline Phosphatase: 83 U/L (ref 38–126)
Anion gap: 9 (ref 5–15)
BUN: 25 mg/dL — ABNORMAL HIGH (ref 8–23)
CO2: 24 mmol/L (ref 22–32)
Calcium: 9.1 mg/dL (ref 8.9–10.3)
Chloride: 106 mmol/L (ref 98–111)
Creatinine, Ser: 1.31 mg/dL — ABNORMAL HIGH (ref 0.44–1.00)
GFR, Estimated: 40 mL/min — ABNORMAL LOW (ref >60)
Glucose, Bld: 101 mg/dL — ABNORMAL HIGH (ref 70–99)
Potassium: 3.9 mmol/L (ref 3.5–5.1)
Sodium: 139 mmol/L (ref 135–145)
Total Bilirubin: 0.7 mg/dL (ref 0.0–1.2)
Total Protein: 7.3 g/dL (ref 6.5–8.1)

## 2024-03-02 LAB — TYPE AND SCREEN
ABO/RH(D): O POS
Antibody Screen: NEGATIVE

## 2024-03-02 LAB — PROTIME-INR
INR: 1 (ref 0.8–1.2)
Prothrombin Time: 14.2 s (ref 11.4–15.2)

## 2024-03-02 MED ORDER — IOHEXOL 350 MG/ML SOLN
75.0000 mL | Freq: Once | INTRAVENOUS | Status: AC | PRN
Start: 2024-03-02 — End: 2024-03-02
  Administered 2024-03-02: 75 mL via INTRAVENOUS

## 2024-03-02 MED ORDER — SODIUM CHLORIDE 0.9 % IV BOLUS
500.0000 mL | Freq: Once | INTRAVENOUS | Status: AC
  Administered 2024-03-02: 500 mL via INTRAVENOUS

## 2024-03-02 NOTE — ED Notes (Signed)
Assisted OOB to BR. Tolerated well

## 2024-03-02 NOTE — ED Notes (Signed)
 Assisted Dr. Suzanne with Rectal exam.

## 2024-03-02 NOTE — ED Provider Notes (Signed)
 CT ANGIO GI BLEED Result Date: 03/02/2024 CLINICAL DATA:  Rectal bleeding. EXAM: CTA ABDOMEN AND PELVIS WITHOUT AND WITH CONTRAST TECHNIQUE: Multidetector CT imaging of the abdomen and pelvis was performed using the standard protocol during bolus administration of intravenous contrast. Multiplanar reconstructed images and MIPs were obtained and reviewed to evaluate the vascular anatomy. RADIATION DOSE REDUCTION: This exam was performed according to the departmental dose-optimization program which includes automated exposure control, adjustment of the mA and/or kV according to patient size and/or use of iterative reconstruction technique. CONTRAST:  75mL OMNIPAQUE  IOHEXOL  350 MG/ML SOLN COMPARISON:  None Available. FINDINGS: VASCULAR Aorta: Aortic atherosclerosis with mural thrombus. 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 No dissection is seen. There is a focal out pouching with ulceration along the right-side of the infrarenal abdominal aorta measuring 1.1 x 0.6 cm, axial image 76. Celiac: Patent without evidence of aneurysm, dissection, vasculitis or significant stenosis. SMA: Patent without evidence of aneurysm, dissection, vasculitis or significant stenosis. Renals: Sclerosis at the ostia bilaterally without evidence of stenosis or dissection. 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 IMA: Patent. Inflow: Extensive atherosclerotic calcification. Patent without evidence of aneurysm, vasculitis or significant stenosis. A small dissection flap is present in the external iliac artery on the right. Proximal Outflow: Bilateral common femoral and visualized portions of the superficial and profunda femoral arteries are patent without evidence of aneurysm, dissection, vasculitis or significant stenosis. Veins: No obvious venous abnormality within the limitations of this arterial phase study. Review of the MIP images confirms the above findings.  NON-VASCULAR Lower chest: There is a small right pleural effusion with consolidation in the right lower lobe. Debris is noted with occlusion right lower lobe bronchioles, unchanged from the prior exam. Patchy airspace disease is present at the right lung base. Hepatobiliary: No focal liver abnormality is seen. Fatty infiltration is noted. No gallstones, gallbladder wall thickening, or biliary dilatation. Pancreas: Unremarkable. No pancreatic ductal dilatation or surrounding inflammatory changes. Spleen: Normal size. There is a stable subcapsular hypoattenuating area along the posterior aspect of the spleen. No perisplenic free fluid is seen. Adrenals/Urinary Tract: The adrenal glands are within normal limits. The kidneys enhance symmetrically. Multifocal renal cortical scarring is noted. Renal calculi are present bilaterally. No hydronephrosis. The bladder is within normal limits. Stomach/Bowel: The stomach is within normal limits. No bowel obstruction, free air, or pneumatosis is seen. Scattered diverticula are noted along the colon without evidence of diverticulitis. Appendix appears normal. No acute or active hemorrhage is seen. Lymphatic: No abdominal or pelvic lymphadenopathy by size criteria. Reproductive: Uterus and bilateral adnexa are unremarkable. Other: No abdominopelvic ascites. A fat containing umbilical hernia is present. Musculoskeletal: Degenerative changes are present in the thoracolumbar spine. Fixation hardware is noted in the proximal left femur. Stable compression deformities are present at L1 and T11. No acute osseous abnormality. IMPRESSION: VASCULAR 1. No evidence of acute or active hemorrhage. 2. 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 Recommend follow-up ultrasound every 3 years. (Ref.: J Vasc Surg. 2018; 67:2-77 and J Am Coll Radiol 2013;10(10):789-794.) 3. Focal outpouching of the infrarenal abdominal aorta with ulceration as  described above. 4. 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 NON-VASCULAR 1. Diverticulosis without diverticulitis. 2. Small right pleural effusion with right lower lobe consolidation, not significantly changed. Left basilar airspace disease is new from the previous exam. 3. Hepatic steatosis. 4. Bilateral nephrolithiasis. 5. Stable a subcapsular  splenic collection. Electronically Signed   By: Leita Birmingham M.D.   On: 03/02/2024 16:51     Dr. Tisa reviewed imaging and clinical history.  From vascular perspective advises no acute concerns, does need to follow-up with Dr. Marea.  Discussed with patient and her daughter the patient already does follow with Dr. Marea, would be happy to set up close follow-up visit for concerns of abdominal aortic abnormality/aneurysm   Also had Dr. Aundria reviewed clinical case, imaging and history.  He recommends that patient may be discharged if no further bloody stools.  At this time the patient has had no further bloody stooling.  She did utilize the bathroom once reports no bloody stool or further bleeding since coming to the ER.  Patient and daughter are comfortable with plan for discharge.  They will follow-up with both vascular and call GI clinic tomorrow to set up close follow-up as guided recommended by Dr. Aundria  Return precautions and treatment recommendations and follow-up discussed with the patient who is agreeable with the plan.    Dicky Anes, MD 03/02/24 [phone]

## 2024-03-02 NOTE — ED Provider Notes (Signed)
 [facility] Provider Note    Event Date/Time   First MD Initiated Contact with Patient 03/02/24 1314     (approximate)   History   Rectal Bleeding   HPI  Christine Burnett is a 83 y.o. female presents to the emergency department with concern for rectal bleed.  Has been having ongoing diarrhea for the past 11 days.  Over the past 2 days has been having bright red blood with her diarrhea.  Some mild lower abdominal pain to the left side.  History of a significant GI bleed approximately 1 year ago and was admitted with a colonoscopy that just showed some inflammation from diverticular bleed.  Not on any anticoagulation.  Denies fever or chills.  No nausea or vomiting.  Does not feel lightheaded.     Physical Exam   Triage Vital Signs: ED Triage Vitals  Encounter Vitals Group     BP 03/02/24 1247 (!) 127/92     Girls Systolic BP Percentile --      Girls Diastolic BP Percentile --      Boys Systolic BP Percentile --      Boys Diastolic BP Percentile --      Pulse Rate 03/02/24 1247 95     Resp 03/02/24 1247 20     Temp 03/02/24 1247 98 F (36.7 C)     Temp Source 03/02/24 1247 Oral     SpO2 03/02/24 1247 99 %     Weight 03/02/24 1249 120 lb (54.4 kg)     Height 03/02/24 1249 5' 7 (1.702 m)     Head Circumference --      Peak Flow --      Pain Score 03/02/24 1249 0     Pain Loc --      Pain Education --      Exclude from Growth Chart --     Most recent vital signs: Vitals:   03/02/24 1247  BP: (!) 127/92  Pulse: 95  Resp: 20  Temp: 98 F (36.7 C)  SpO2: 99%    Physical Exam Exam conducted with a chaperone present.  Constitutional:      Appearance: She is well-developed.  HENT:     Head: Atraumatic.  Eyes:     Conjunctiva/sclera: Conjunctivae normal.  Cardiovascular:     Rate and Rhythm: Regular rhythm.  Pulmonary:     Effort: No respiratory distress.  Abdominal:     General: There is no distension.     Tenderness: There is no  abdominal tenderness.  Genitourinary:    Comments: Rectal exam with dark red blood mixed with stool Musculoskeletal:        General: Normal range of motion.     Cervical back: Normal range of motion.  Skin:    General: Skin is warm.  Neurological:     Mental Status: She is alert. Mental status is at baseline.     IMPRESSION / MDM / ASSESSMENT AND PLAN / ED COURSE  I reviewed the triage vital signs and the nursing notes.  Differential diagnosis including acute diverticulitis, infectious diarrhea, malignancy.  Have a low suspicion for mesenteric ischemia, no pain out of proportion.  Have a low suspicion for upper GI bleed   No tachycardic or bradycardic dysrhythmias while on cardiac telemetry.  RADIOLOGY CT angio GI pending  LABS (all labs ordered are listed, but only abnormal results are displayed) Labs interpreted as -    Labs Reviewed  COMPREHENSIVE METABOLIC PANEL WITH GFR -  Abnormal; Notable for the following components:      Result Value   Glucose, Bld 101 (*)    BUN 25 (*)    Creatinine, Ser 1.31 (*)    Albumin 3.4 (*)    GFR, Estimated 40 (*)    All other components within normal limits  CBC - Abnormal; Notable for the following components:   RBC 3.12 (*)    Hemoglobin 10.6 (*)    HCT 32.2 (*)    MCV 103.2 (*)    All other components within normal limits  GASTROINTESTINAL PANEL BY PCR, STOOL (REPLACES STOOL CULTURE)  C DIFFICILE QUICK SCREEN W PCR REFLEX    PROTIME-INR  POC OCCULT BLOOD, ED  TYPE AND SCREEN     MDM  No significant leukocytosis.  Hemoglobin is stable at 10.6 but slightly down trended from her baseline.  Normal INR.  No significant electrolyte abnormality.  Patient was typed and screened.  Does not need an emergent blood transfusion.  Ordered GI pathogen panel and C. difficile however has not been able to provide a sample since being in the emergency department.  If CTA without any concerning features plan to discharge home with outpatient  follow-up with gastroenterology.  Has a follow-up appointment with her oncologist on Tuesday discussed rechecking her hemoglobin level at that appointment to make sure that it is not downtrending and she does not need a blood transfusion.  Care transferred to incoming provider with CT pending     PROCEDURES:  Critical Care performed: No  Procedures  Patient's presentation is most consistent with acute presentation with potential threat to life or bodily function.   MEDICATIONS ORDERED IN ED: Medications  sodium chloride  0.9 % bolus 500 mL (500 mLs Intravenous New Bag/Given 03/02/24 1426)  iohexol  (OMNIPAQUE ) 350 MG/ML injection 75 mL (75 mLs Intravenous Contrast Given 03/02/24 1544)    FINAL CLINICAL IMPRESSION(S) / ED DIAGNOSES   Final diagnoses:  Rectal bleeding     Rx / DC Orders   ED Discharge Orders     None        Note:  This document was prepared using Dragon voice recognition software and may include unintentional dictation errors.   Suzanne Kirsch, MD 03/02/24 [phone]

## 2024-03-02 NOTE — Discharge Instructions (Addendum)
 Please return to the emergency room right away if you have further bleeding, any severe or concerning abdominal pain, fevers, weakness feel faint passed out or other concerns arise

## 2024-03-02 NOTE — ED Triage Notes (Signed)
 Patient to ED via POV for rectal bleeding- started last night. Bright red in color. States diarrhea x11 days.

## 2024-03-02 NOTE — ED Notes (Signed)
 Pt taken to ct

## 2024-03-04 ENCOUNTER — Inpatient Hospital Stay: Attending: Oncology

## 2024-03-04 ENCOUNTER — Inpatient Hospital Stay: Admitting: Oncology

## 2024-03-04 ENCOUNTER — Ambulatory Visit (INDEPENDENT_AMBULATORY_CARE_PROVIDER_SITE_OTHER): Admitting: Vascular Surgery

## 2024-03-04 ENCOUNTER — Encounter: Payer: Self-pay | Admitting: Oncology

## 2024-03-04 ENCOUNTER — Encounter (INDEPENDENT_AMBULATORY_CARE_PROVIDER_SITE_OTHER): Payer: Self-pay | Admitting: Vascular Surgery

## 2024-03-04 VITALS — BP 132/68 | HR 100 | Ht 66.0 in | Wt 114.5 lb

## 2024-03-04 VITALS — BP 126/80 | HR 101 | Temp 98.6°F | Resp 19 | Wt 114.0 lb

## 2024-03-04 DIAGNOSIS — D509 Iron deficiency anemia, unspecified: Secondary | ICD-10-CM | POA: Insufficient documentation

## 2024-03-04 DIAGNOSIS — Z85118 Personal history of other malignant neoplasm of bronchus and lung: Secondary | ICD-10-CM

## 2024-03-04 DIAGNOSIS — E782 Mixed hyperlipidemia: Secondary | ICD-10-CM

## 2024-03-04 DIAGNOSIS — I771 Stricture of artery: Secondary | ICD-10-CM

## 2024-03-04 DIAGNOSIS — I7143 Infrarenal abdominal aortic aneurysm, without rupture: Secondary | ICD-10-CM | POA: Diagnosis not present

## 2024-03-04 DIAGNOSIS — Z08 Encounter for follow-up examination after completed treatment for malignant neoplasm: Secondary | ICD-10-CM

## 2024-03-04 DIAGNOSIS — R197 Diarrhea, unspecified: Secondary | ICD-10-CM | POA: Diagnosis not present

## 2024-03-04 DIAGNOSIS — N183 Chronic kidney disease, stage 3 unspecified: Secondary | ICD-10-CM | POA: Diagnosis not present

## 2024-03-04 DIAGNOSIS — I10 Essential (primary) hypertension: Secondary | ICD-10-CM

## 2024-03-04 DIAGNOSIS — I714 Abdominal aortic aneurysm, without rupture, unspecified: Secondary | ICD-10-CM | POA: Insufficient documentation

## 2024-03-04 DIAGNOSIS — D631 Anemia in chronic kidney disease: Secondary | ICD-10-CM | POA: Insufficient documentation

## 2024-03-04 DIAGNOSIS — C3431 Malignant neoplasm of lower lobe, right bronchus or lung: Secondary | ICD-10-CM

## 2024-03-04 DIAGNOSIS — I739 Peripheral vascular disease, unspecified: Secondary | ICD-10-CM

## 2024-03-04 LAB — IRON AND TIBC
Iron: 30 ug/dL (ref 28–170)
Saturation Ratios: 14 % (ref 10.4–31.8)
TIBC: 216 ug/dL — ABNORMAL LOW (ref 250–450)
UIBC: 186 ug/dL

## 2024-03-04 LAB — CBC WITH DIFFERENTIAL (CANCER CENTER ONLY)
Abs Immature Granulocytes: 0.1 K/uL — ABNORMAL HIGH (ref 0.00–0.07)
Basophils Absolute: 0 K/uL (ref 0.0–0.1)
Basophils Relative: 1 %
Eosinophils Absolute: 0.1 K/uL (ref 0.0–0.5)
Eosinophils Relative: 1 %
HCT: 29.9 % — ABNORMAL LOW (ref 36.0–46.0)
Hemoglobin: 10 g/dL — ABNORMAL LOW (ref 12.0–15.0)
Immature Granulocytes: 1 %
Lymphocytes Relative: 12 %
Lymphs Abs: 1 K/uL (ref 0.7–4.0)
MCH: 34.6 pg — ABNORMAL HIGH (ref 26.0–34.0)
MCHC: 33.4 g/dL (ref 30.0–36.0)
MCV: 103.5 fL — ABNORMAL HIGH (ref 80.0–100.0)
Monocytes Absolute: 0.5 K/uL (ref 0.1–1.0)
Monocytes Relative: 5 %
Neutro Abs: 7.1 K/uL (ref 1.7–7.7)
Neutrophils Relative %: 80 %
Platelet Count: 214 K/uL (ref 150–400)
RBC: 2.89 MIL/uL — ABNORMAL LOW (ref 3.87–5.11)
RDW: 13.7 % (ref 11.5–15.5)
WBC Count: 8.8 K/uL (ref 4.0–10.5)
nRBC: 0 % (ref 0.0–0.2)

## 2024-03-04 LAB — FERRITIN: Ferritin: 165 ng/mL (ref 11–307)

## 2024-03-04 NOTE — Progress Notes (Signed)
 Patient ID: Christine Burnett, female   DOB: [birth year], 83 y.o.   MRN: [id number]  Chief Complaint  Patient presents with   Establish Care    HPI Christine Burnett is a 83 y.o. female.  I am asked to see the patient by Dr. 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  She is having some vague abdominal pain that has been hard to characterize.  She denies any worsening of the symptoms or other major issues in terms of her pain.  She was having a lot of diarrhea some of which was bloody and has been referred to GI.  As part of her workup she had a CT angiogram which I have independently reviewed.  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  The maximal size of the abdominal aorta is 3.6 cm in diameter.     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   Managed by nephrologist   Lower GI bleed 2025   Non-small cell lung cancer (NSCLC) (HCC)    Osteoporosis    Oxygen  deficiency Me   Risk for falls    Situational disturbance    Stroke (HCC) 2011   tia's x 2   TIA (transient ischemic attack)     Past Surgical History:  Procedure Laterality Date   CAROTID PTA/STENT INTERVENTION Right 04/20/2023   Procedure: CAROTID PTA/STENT  INTERVENTION;  Surgeon: Marea Selinda RAMAN, MD;  Location: [facility];  Service: Cardiovascular;  Laterality: Right;  Inominate Artery Stenosis requiring stenting   COLONOSCOPY  2016   COLONOSCOPY WITH PROPOFOL  N/A 06/29/2023   Procedure: COLONOSCOPY WITH PROPOFOL ;  Surgeon: Jinny Carmine, MD;  Location: [facility];  Service: Endoscopy;  Laterality: N/A;   ESOPHAGUS SURGERY     INTRAMEDULLARY (IM) NAIL INTERTROCHANTERIC Left 03/26/2017   Procedure: INTRAMEDULLARY (IM) NAIL INTERTROCHANTRIC;  Surgeon: Kathlynn Sharper, MD;  Location: [facility];  Service: Orthopedics;  Laterality: Left;   LEFT HEART CATH AND CORONARY ANGIOGRAPHY N/A 04/15/2021   Procedure: LEFT HEART CATH AND CORONARY ANGIOGRAPHY;  Surgeon: Darron Deatrice LABOR, MD;  Location: [facility];  Service: Cardiovascular;  Laterality: N/A;   RIGHT/LEFT HEART CATH AND CORONARY ANGIOGRAPHY Bilateral 05/15/2022   Procedure: RIGHT/LEFT HEART CATH AND CORONARY ANGIOGRAPHY;  Surgeon: Darron Deatrice LABOR, MD;  Location: [facility];  Service: Cardiovascular;  Laterality: Bilateral;   VIDEO BRONCHOSCOPY WITH ENDOBRONCHIAL NAVIGATION Right 06/28/2020   Procedure: VIDEO BRONCHOSCOPY WITH ENDOBRONCHIAL NAVIGATION CELLVIZIO;  Surgeon: Tamea Dedra CROME, MD;  Location: [facility];  Service: Pulmonary;  Laterality: Right;     Family History  Problem Relation Age of Onset   Other Mother        [place] posioning  Kidney disease Mother    Heart attack Father    Prostate cancer Father    Cancer Father    Heart disease Father    Heart attack Brother    Diabetes Brother    Hypertension Daughter    Kidney cancer Neg Hx    Bladder Cancer Neg Hx    Colon cancer Neg Hx    Colon polyps Neg Hx    Rectal cancer Neg Hx       Social History   Tobacco Use   Smoking status: Former    Current packs/day: 0.00    Average packs/day: 1 pack/day for 61.0 years (61.0 ttl pk-yrs)    Types: Cigarettes    Start date: 03/25/1956    Quit  date: 03/25/2017    Years since quitting: 6.9   Smokeless tobacco: Never  Vaping Use   Vaping status: Never Used  Substance Use Topics   Alcohol use: No   Drug use: No     Allergies  Allergen Reactions   Zithromax  [Azithromycin ]    Cefuroxime Axetil Rash   Ciprofloxacin  Nausea Only    Unknown   Codeine Other (See Comments)    Patient does not like how the medication makes you feel     Current Outpatient Medications  Medication Sig Dispense Refill   albuterol  (VENTOLIN  HFA) 108 (90 Base) MCG/ACT inhaler Inhale 2 puffs into the lungs every 4 (four) hours as needed for wheezing or shortness of breath. 8 g 1   aspirin  81 MG EC tablet Take 81 mg by mouth at bedtime. Swallow whole.     atorvastatin  (LIPITOR) 40 MG tablet TAKE 1 TABLET BY MOUTH AT BEDTIME 90 tablet 0   BRILINTA  90 MG TABS tablet Take 1 tablet by mouth twice daily 60 tablet 3   budeson-glycopyrrolate-formoterol (BREZTRI  AEROSPHERE) 160-9-4.8 MCG/ACT AERO inhaler Inhale 2 puffs into the lungs 2 (two) times daily. 10.7 g 11   busPIRone  (BUSPAR ) 5 MG tablet Take 1 tablet (5 mg total) by mouth 2 (two) times daily as needed (stress anxiety). 60 tablet 1   calcium  carbonate (TUMS - DOSED IN MG ELEMENTAL CALCIUM ) 500 MG chewable tablet Chew 1.5 tablets (300 mg of elemental calcium  total) by mouth every Monday, Tuesday, Wednesday, Thursday, and Friday.     Cholecalciferol  (VITAMIN D3) 50 MCG (2000 UT) TABS Take 2,000 Units by mouth at bedtime.     ferrous sulfate  325 (65 FE) MG EC tablet Take 1 tablet (325 mg total) by mouth every other day. 45 tablet 3   Melatonin 10 MG TABS Take 10 mg by mouth at bedtime.     Multiple Vitamin (MULTIVITAMIN) capsule Take 1 capsule by mouth daily.     PARoxetine  (PAXIL ) 40 MG tablet Take 1 tablet (40 mg total) by mouth every morning. 90 tablet 2   No current facility-administered medications for this visit.       REVIEW OF SYSTEMS (Negative unless checked)   Constitutional: [] Weight loss   [] Fever  [] Chills Cardiac: [] Chest pain   [] Chest pressure   [] Palpitations   [] Shortness of breath when laying flat   [] Shortness of breath at rest   [] Shortness of breath with exertion. Vascular:  [x] Pain in legs with walking   [] Pain in legs at rest   [] Pain in legs when laying flat   [] Claudication   [] Pain in feet when walking  [] Pain in feet at rest  [] Pain in feet when laying flat   [] History of DVT   [] Phlebitis   []   Swelling in legs   [] Varicose veins   [] Non-healing ulcers Pulmonary:   [] Uses home oxygen    [] Productive cough   [] Hemoptysis   [] Wheeze  [] COPD   [] Asthma Neurologic:  [] Dizziness  [] Blackouts   [] Seizures   [] History of stroke   [x] History of TIA  [] Aphasia   [] Temporary blindness   [] Dysphagia   [] Weakness or numbness in arms   [] Weakness or numbness in legs Musculoskeletal:  [] Arthritis   [] Joint swelling   [] Joint pain   [] Low back pain Hematologic:  [x] Easy bruising  [] Easy bleeding   [] Hypercoagulable state   [] Anemic   Gastrointestinal:  [] Blood in stool   [] Vomiting blood  [] Gastroesophageal reflux/heartburn   [] Abdominal pain Genitourinary:  [x] Chronic kidney disease   [] Difficult urination  [] Frequent urination  [] Burning with urination   [] Hematuria Skin:  [] Rashes   [] Ulcers   [] Wounds Psychological:  [] History of anxiety   []  History of major depression.    Physical Exam BP 132/68   Pulse 100   Ht 5' 6 (1.676 m)   Wt 114 lb 8 oz (51.9 kg)   BMI 18.48 kg/m  Gen:  WD/WN, NAD Head: [place]/AT, No temporalis wasting.  Ear/Nose/Throat: Hearing grossly intact, nares w/o erythema or drainage, oropharynx w/o Erythema/Exudate Eyes: Conjunctiva clear, sclera non-icteric  Neck: trachea midline.  No JVD.  Pulmonary:  Good air movement, respirations not labored, no use of accessory muscles  Cardiac: RRR, no JVD Vascular:  Vessel Right Left  Radial Palpable Palpable                                   Gastrointestinal:. No masses, surgical incisions, or  scars. Musculoskeletal: M/S 5/5 throughout.  Extremities without ischemic changes.  No deformity or atrophy. Trace LE edema. Neurologic: Sensation grossly intact in extremities.  Symmetrical.  Speech is fluent. Motor exam as listed above. Psychiatric: Judgment intact, Mood & affect appropriate for pt's clinical situation. Dermatologic: No rashes or ulcers noted.  No cellulitis or open wounds.    Radiology CT ANGIO GI BLEED Result Date: 03/02/2024 CLINICAL DATA:  Rectal bleeding. EXAM: CTA ABDOMEN AND PELVIS WITHOUT AND WITH CONTRAST TECHNIQUE: Multidetector CT imaging of the abdomen and pelvis was performed using the standard protocol during bolus administration of intravenous contrast. Multiplanar reconstructed images and MIPs were obtained and reviewed to evaluate the vascular anatomy. RADIATION DOSE REDUCTION: This exam was performed according to the departmental dose-optimization program which includes automated exposure control, adjustment of the mA and/or kV according to patient size and/or use of iterative reconstruction technique. CONTRAST:  75mL OMNIPAQUE  IOHEXOL  350 MG/ML SOLN COMPARISON:  None Available. FINDINGS: VASCULAR Aorta: Aortic atherosclerosis with mural thrombus. 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 No dissection is seen. There is a focal out pouching with ulceration along the right-side of the infrarenal abdominal aorta measuring 1.1 x 0.6 cm, axial image 76. Celiac: Patent without evidence of aneurysm, dissection, vasculitis or significant stenosis. SMA: Patent without evidence of aneurysm, dissection, vasculitis or significant stenosis. Renals: Sclerosis at the ostia bilaterally without evidence of stenosis or dissection. 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 IMA: Patent. Inflow: Extensive atherosclerotic calcification. Patent without evidence of aneurysm, vasculitis or significant stenosis. A small  dissection flap is present in the external iliac artery on the right. Proximal Outflow: Bilateral common femoral and visualized portions of the superficial and profunda  femoral arteries are patent without evidence of aneurysm, dissection, vasculitis or significant stenosis. Veins: No obvious venous abnormality within the limitations of this arterial phase study. Review of the MIP images confirms the above findings. NON-VASCULAR Lower chest: There is a small right pleural effusion with consolidation in the right lower lobe. Debris is noted with occlusion right lower lobe bronchioles, unchanged from the prior exam. Patchy airspace disease is present at the right lung base. Hepatobiliary: No focal liver abnormality is seen. Fatty infiltration is noted. No gallstones, gallbladder wall thickening, or biliary dilatation. Pancreas: Unremarkable. No pancreatic ductal dilatation or surrounding inflammatory changes. Spleen: Normal size. There is a stable subcapsular hypoattenuating area along the posterior aspect of the spleen. No perisplenic free fluid is seen. Adrenals/Urinary Tract: The adrenal glands are within normal limits. The kidneys enhance symmetrically. Multifocal renal cortical scarring is noted. Renal calculi are present bilaterally. No hydronephrosis. The bladder is within normal limits. Stomach/Bowel: The stomach is within normal limits. No bowel obstruction, free air, or pneumatosis is seen. Scattered diverticula are noted along the colon without evidence of diverticulitis. Appendix appears normal. No acute or active hemorrhage is seen. Lymphatic: No abdominal or pelvic lymphadenopathy by size criteria. Reproductive: Uterus and bilateral adnexa are unremarkable. Other: No abdominopelvic ascites. A fat containing umbilical hernia is present. Musculoskeletal: Degenerative changes are present in the thoracolumbar spine. Fixation hardware is noted in the proximal left femur. Stable compression deformities are  present at L1 and T11. No acute osseous abnormality. IMPRESSION: VASCULAR 1. No evidence of acute or active hemorrhage. 2. 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 Recommend follow-up ultrasound every 3 years. (Ref.: J Vasc Surg. 2018; 67:2-77 and J Am Coll Radiol 2013;10(10):789-794.) 3. Focal outpouching of the infrarenal abdominal aorta with ulceration as described above. 4. 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 NON-VASCULAR 1. Diverticulosis without diverticulitis. 2. Small right pleural effusion with right lower lobe consolidation, not significantly changed. Left basilar airspace disease is new from the previous exam. 3. Hepatic steatosis. 4. Bilateral nephrolithiasis. 5. Stable a subcapsular splenic collection. Electronically Signed   By: Leita Birmingham M.D.   On: 03/02/2024 16:51   VAS US  ABI WITH/WO TBI Result Date: 02/13/2024  LOWER EXTREMITY DOPPLER STUDY Patient Name:  JODEEN MCLIN  Date of Exam:   02/12/2024 Medical Rec #: [id number]           Accession #:    [id number] Date of Birth: 1940/08/08           Patient Gender: F Patient Age:   75 years Exam Location:  [facility] Procedure:      VAS US  ABI WITH/WO TBI Referring Phys: Selinda Gu --------------------------------------------------------------------------------  Indications: Peripheral artery disease. High Risk Factors: Hypertension, hyperlipidemia, coronary artery disease.  Vascular Interventions: 04/20/2023 PTA of right iliac artery and stent of right                         innominate artery. Comparison Study: 05/18/2023 Performing Technologist: Leafy Gibes RVS  Examination Guidelines: A complete evaluation includes at minimum, Doppler waveform signals and systolic blood pressure reading at the level of bilateral brachial, anterior tibial, and posterior tibial arteries, when vessel segments are accessible. Bilateral testing is considered an integral part  of a complete examination. Photoelectric Plethysmograph (PPG) waveforms and toe systolic pressure readings are included as required and additional duplex testing as needed. Limited examinations for reoccurring indications may  be performed as noted.  ABI Findings: +---------+------------------+-----+---------+--------+ Right    Rt Pressure (mmHg)IndexWaveform Comment  +---------+------------------+-----+---------+--------+ Brachial 130                                      +---------+------------------+-----+---------+--------+ ATA      119               0.92 triphasic         +---------+------------------+-----+---------+--------+ PTA      130               1.00 triphasic         +---------+------------------+-----+---------+--------+ Great Toe136               1.05 Normal            +---------+------------------+-----+---------+--------+ +---------+------------------+-----+---------+-------+ Left     Lt Pressure (mmHg)IndexWaveform Comment +---------+------------------+-----+---------+-------+ Brachial 123                                     +---------+------------------+-----+---------+-------+ ATA      141               1.08 triphasic        +---------+------------------+-----+---------+-------+ PTA      141               1.08 triphasic        +---------+------------------+-----+---------+-------+ Great Toe119               0.92 Normal           +---------+------------------+-----+---------+-------+ +-------+-----------+-----------+------------+------------+ ABI/TBIToday's ABIToday's TBIPrevious ABIPrevious TBI +-------+-----------+-----------+------------+------------+ Right  1.0        1.05       1.04        .67          +-------+-----------+-----------+------------+------------+ Left   1.09       .92        .95         .69          +-------+-----------+-----------+------------+------------+  Bilateral TBIs appear increased compared to  prior study on 05/18/2023. Bilateral ABIs appear essentially unchanged compared to prior study on 05/18/2023.  Summary: Right: Resting right ankle-brachial index is within normal range. The right toe-brachial index is normal.  Left: Resting left ankle-brachial index is within normal range. The left toe-brachial index is normal.  *See table(s) above for measurements and observations.  Electronically signed by Selinda Gu MD on 02/13/2024 at 7:24:43 AM.    Final    VAS US  CAROTID Result Date: 02/13/2024 Carotid Arterial Duplex Study Patient Name:  CHENITA RUDA  Date of Exam:   02/12/2024 Medical Rec #: [id number]           Accession #:    [id number] Date of Birth: [birth year]           Patient Gender: F Patient Age:   28 years [facility]am [facility] Procedure:      VAS US  CAROTID Referring Phys: SELINDA GU --------------------------------------------------------------------------------  Indications:       Carotid artery disease. Risk Factors:      Hypertension. Other Factors:     04/20/2023 PTA of right iliac artery and stent of right                    innominate artery.  Comparison Study:  08/21/2023; In comparison to prior study the Left ICA is                    1-39% verses 40-59% in previous study. Performing Technologist: Leafy Gibes RVS  Examination Guidelines: A complete evaluation includes B-mode imaging, spectral Doppler, color Doppler, and power Doppler as needed of all accessible portions of each vessel. Bilateral testing is considered an integral part of a complete examination. Limited examinations for reoccurring indications may be performed as noted.  Right Carotid Findings: +----------+--------+--------+--------+------------------+--------+           PSV cm/sEDV cm/sStenosisPlaque DescriptionComments +----------+--------+--------+--------+------------------+--------+ CCA Prox  74      10                                          +----------+--------+--------+--------+------------------+--------+ CCA Mid   68      12                                         +----------+--------+--------+--------+------------------+--------+ CCA Distal65      12                                         +----------+--------+--------+--------+------------------+--------+ ICA Prox  80      14                                         +----------+--------+--------+--------+------------------+--------+ ICA Mid   74      17                                         +----------+--------+--------+--------+------------------+--------+ ICA Distal73      17                                         +----------+--------+--------+--------+------------------+--------+ ECA       95      00                                         +----------+--------+--------+--------+------------------+--------+ +----------+--------+-------+--------+-------------------+           PSV cm/sEDV cmsDescribeArm Pressure (mmHG) +----------+--------+-------+--------+-------------------+ Subclavian100     0                                  +----------+--------+-------+--------+-------------------+ +---------+--------+--+--------+-+ VertebralPSV cm/s46EDV cm/s9 +---------+--------+--+--------+-+  Left Carotid Findings: +----------+--------+--------+--------+------------------+--------+           PSV cm/sEDV cm/sStenosisPlaque DescriptionComments +----------+--------+--------+--------+------------------+--------+ CCA Prox  74      10                                         +----------+--------+--------+--------+------------------+--------+  CCA Mid   59      18                                         +----------+--------+--------+--------+------------------+--------+ CCA Distal58      15                                         +----------+--------+--------+--------+------------------+--------+ ICA Prox  109     25                                          +----------+--------+--------+--------+------------------+--------+ ICA Mid   100     22                                         +----------+--------+--------+--------+------------------+--------+ ICA Distal80      19                                         +----------+--------+--------+--------+------------------+--------+ ECA       182     6                                          +----------+--------+--------+--------+------------------+--------+ +----------+--------+--------+--------+-------------------+           PSV cm/sEDV cm/sDescribeArm Pressure (mmHG) +----------+--------+--------+--------+-------------------+ [id number]      9                                   +----------+--------+--------+--------+-------------------+ +---------+--------+--+--------+-+ VertebralPSV cm/s25EDV cm/s6 +---------+--------+--+--------+-+   Summary: Right Carotid: Velocities in the right ICA are consistent with a 1-39% stenosis. Left Carotid: Velocities in the left ICA are consistent with a 1-39% stenosis. Vertebrals:  Bilateral vertebral arteries demonstrate antegrade flow. Subclavians: Normal flow hemodynamics were seen in bilateral subclavian              arteries. *See table(s) above for measurements and observations.  Electronically signed by Selinda Gu MD on 02/13/2024 at 7:24:09 AM.    Final     Labs Recent Results (from the past 2160 hours)  VAS US  ABI WITH/WO TBI     Status: None   Collection Time: 02/12/24  1:35 PM  Result Value Ref Range   Right ABI 1.0    Left ABI 1.09   Comprehensive metabolic panel     Status: Abnormal   Collection Time: 03/02/24 12:51 PM  Result Value Ref Range   Sodium 139 135 - 145 mmol/L   Potassium 3.9 3.5 - 5.1 mmol/L   Chloride 106 98 - 111 mmol/L   CO2 24 22 - 32 mmol/L   Glucose, Bld 101 (H) 70 - 99 mg/dL    Comment: Glucose reference range applies only to samples taken after fasting for at  least 8 hours.   BUN 25 (H) 8 - 23 mg/dL   Creatinine, Ser  1.31 (H) 0.44 - 1.00 mg/dL   Calcium  9.1 8.9 - 10.3 mg/dL   Total Protein 7.3 6.5 - 8.1 g/dL   Albumin 3.4 (L) 3.5 - 5.0 g/dL   AST 22 15 - 41 U/L   ALT 15 0 - 44 U/L   Alkaline Phosphatase 83 38 - 126 U/L   Total Bilirubin 0.7 0.0 - 1.2 mg/dL   GFR, Estimated 40 (L) >60 mL/min    Comment: (NOTE) Calculated using the CKD-EPI Creatinine Equation (2021)    Anion gap 9 5 - 15    Comment: Performed at [facility], [address] [place], [place] [address]  CBC     Status: Abnormal   Collection Time: 03/02/24 12:51 PM  Result Value Ref Range   WBC 6.5 4.0 - 10.5 K/uL   RBC 3.12 (L) 3.87 - 5.11 MIL/uL   Hemoglobin 10.6 (L) 12.0 - 15.0 g/dL   HCT 67.7 (L) 63.9 - 53.9 %   MCV 103.2 (H) 80.0 - 100.0 fL   MCH 34.0 26.0 - 34.0 pg   MCHC 32.9 30.0 - 36.0 g/dL   RDW 86.2 88.4 - 84.4 %   Platelets 210 150 - 400 K/uL   nRBC 0.0 0.0 - 0.2 %    Comment: Performed at [facility], [address] [place], [place] [address]  Type and screen [facility]     Status: None   Collection Time: 03/02/24 12:51 PM  Result Value Ref Range   ABO/RH(D) O POS    Antibody Screen NEG    Sample Expiration      03/05/2024,2359 Performed at [facility], [address], [place], [place] [address]   Protime-INR - (order if Patient is taking Coumadin  / Warfarin)     Status: None   Collection Time: 03/02/24 12:51 PM  Result Value Ref Range   Prothrombin Time 14.2 11.4 - 15.2 seconds   INR 1.0 0.8 - 1.2    Comment: (NOTE) INR goal varies based on device and disease states. Performed at [facility], [address] [place], [place] [address]   CBC with Differential (Cancer Center Only)     Status: Abnormal   Collection Time: 03/04/24  2:44 PM  Result Value Ref Range   WBC Count 8.8 4.0 - 10.5 K/uL   RBC 2.89 (L) 3.87 - 5.11 MIL/uL   Hemoglobin 10.0 (L) 12.0 - 15.0 g/dL   HCT 70.0  (L) 63.9 - 46.0 %   MCV 103.5 (H) 80.0 - 100.0 fL   MCH 34.6 (H) 26.0 - 34.0 pg   MCHC 33.4 30.0 - 36.0 g/dL   RDW 86.2 88.4 - 84.4 %   Platelet Count 214 150 - 400 K/uL   nRBC 0.0 0.0 - 0.2 %   Neutrophils Relative % 80 %   Neutro Abs 7.1 1.7 - 7.7 K/uL   Lymphocytes Relative 12 %   Lymphs Abs 1.0 0.7 - 4.0 K/uL   Monocytes Relative 5 %   Monocytes Absolute 0.5 0.1 - 1.0 K/uL   Eosinophils Relative 1 %   Eosinophils Absolute 0.1 0.0 - 0.5 K/uL   Basophils Relative 1 %   Basophils Absolute 0.0 0.0 - 0.1 K/uL   Immature Granulocytes 1 %   Abs Immature Granulocytes 0.10 (H) 0.00 - 0.07 K/uL    Comment: Performed at [facility], [address] [place], [place] [address]    Assessment/Plan:  AAA (abdominal aortic aneurysm) without rupture As part  of her workup she had a CT angiogram which I have independently reviewed.  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  The maximal size of the abdominal aorta is 3.6 cm in diameter.   I had a long discussion with the patient and her daughter today.  This is below the threshold for repair and I am not convinced that she is having any symptoms from this aortic issue.  We will watch this fairly closely and she has some follow-up visit shortly after the first of the year to monitor her other vascular issues.  We will get an aortic duplex at that time.  Essential hypertension blood pressure control important in reducing the progression of atherosclerotic disease. On appropriate oral medications.     Hyperlipidemia lipid control important in reducing the progression of atherosclerotic disease. Continue statin therapy  Innominate artery stenosis (HCC) Duplex earlier this month showed normal velocities in both carotid arteries as well as normal flow hemodynamics seen in both subclavian arteries with bilateral  antegrade vertebral arteries.  This would be consistent with a widely patent innominate artery stent and no significant cervical carotid disease.  I would recommend continued Brilinta , aspirin , and Lipitor.  I would recommend recheck in approximately 6 months.  PAD (peripheral artery disease) (HCC) ABIs were also done earlier this month showing a right ABI of 1.0 and a left ABI of 1.08 with normal digital pressures and waveforms and normal triphasic lower extremity waveforms consistent with maintained perfusion distally after previous iliac angioplasty.  Continue current medical regimen.  Recheck at her next visit in 6 months and then we can likely go to once a year follow-up.    Selinda Gu 03/04/2024, 4:30 PM   This note was created with Dragon medical transcription system.  Any errors from dictation are unintentional.

## 2024-03-04 NOTE — Assessment & Plan Note (Signed)
 As part of her workup she had a CT angiogram which I have independently reviewed.  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  The maximal size of the abdominal aorta is 3.6 cm in diameter.   I had a long discussion with the patient and her daughter today.  This is below the threshold for repair and I am not convinced that she is having any symptoms from this aortic issue.  We will watch this fairly closely and she has some follow-up visit shortly after the first of the year to monitor her other vascular issues.  We will get an aortic duplex at that time.

## 2024-03-05 ENCOUNTER — Other Ambulatory Visit: Payer: Self-pay

## 2024-03-05 ENCOUNTER — Inpatient Hospital Stay: Attending: Oncology

## 2024-03-05 ENCOUNTER — Ambulatory Visit: Payer: Self-pay | Admitting: Oncology

## 2024-03-05 DIAGNOSIS — R197 Diarrhea, unspecified: Secondary | ICD-10-CM

## 2024-03-05 DIAGNOSIS — C3431 Malignant neoplasm of lower lobe, right bronchus or lung: Secondary | ICD-10-CM

## 2024-03-05 LAB — C DIFFICILE QUICK SCREEN W PCR REFLEX
C Diff antigen: NEGATIVE
C Diff interpretation: NOT DETECTED
C Diff toxin: NEGATIVE

## 2024-03-07 ENCOUNTER — Telehealth: Payer: Self-pay

## 2024-03-07 NOTE — Telephone Encounter (Signed)
 Attempted to call patient to notify her regarding c-diff results.

## 2024-03-09 ENCOUNTER — Encounter: Payer: Self-pay | Admitting: Oncology

## 2024-03-09 NOTE — Progress Notes (Signed)
 Hematology/Oncology Consult note [facility]  [phone] [phone]  Patient Care Team: Leavy Mole, PA-C as PCP - General (Family Medicine) Darliss Rogue, MD as PCP - Cardiology (Cardiology) Marcelino Gales, MD as Consulting Physician (Nephrology) Verdene Gills, RN as Oncology Nurse Navigator Tamea Dedra CROME, MD as Consulting Physician (Pulmonary Disease) Melanee Annah BROCKS, MD as Consulting Physician (Oncology) Lenn Aran, MD as Referring Physician (Radiation Oncology)   Name of the patient: Christine Burnett  [id number]  [birth year]   Date of visit: 03/09/24  Diagnosis- anemia of chronic kidney disease along with component of iron deficiency 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  No hypermetabolic hilar or mediastinal adenopathy.  She received SBRT to the lesion in March 2022 and has been in remission since then.  CT chest abdomen and pelvis with contrast in November 2024 showed no evidence of recurrent or progressive disease.   She has now been referred to me for anemia of chronic kidney disease.  She required a unit of blood transfusion in March 2025 as well.  Interval history-she is doing well for her age.  No recent hospitalizations.  She has been having some ongoing diarrhea for the last 4 to 5 days.  No recent antibiotic use  ECOG PS- 2 Pain scale- 0  Review of systems- Review of Systems  Constitutional:  Negative for chills, fever, malaise/fatigue and weight loss.  HENT:  Negative for congestion, ear discharge and nosebleeds.   Eyes:  Negative for blurred vision.  Respiratory:  Negative for cough, hemoptysis, sputum production, shortness of breath and wheezing.    Cardiovascular:  Negative for chest pain, palpitations, orthopnea and claudication.  Gastrointestinal:  Positive for diarrhea. Negative for abdominal pain, blood in stool, constipation, heartburn, melena, nausea and vomiting.  Genitourinary:  Negative for dysuria, flank pain, frequency, hematuria and urgency.  Musculoskeletal:  Negative for back pain, joint pain and myalgias.  Skin:  Negative for rash.  Neurological:  Negative for dizziness, tingling, focal weakness, seizures, weakness and headaches.  Endo/Heme/Allergies:  Does not bruise/bleed easily.  Psychiatric/Behavioral:  Negative for depression and suicidal ideas. The patient does not have insomnia.       Allergies  Allergen Reactions   Zithromax  [Azithromycin ]    Cefuroxime Axetil Rash   Ciprofloxacin  Nausea Only    Unknown   Codeine Other (See Comments)    Patient does not like how the medication makes you feel      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   Managed by nephrologist   Lower GI bleed 2025   Non-small cell lung cancer (NSCLC) (HCC)    Osteoporosis    Oxygen  deficiency Me  Risk for falls    Situational disturbance    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's x 2   TIA (transient ischemic attack)      Past Surgical History:  Procedure Laterality Date   CAROTID PTA/STENT INTERVENTION Right 04/20/2023   Procedure: CAROTID PTA/STENT INTERVENTION;  Surgeon: Marea Selinda RAMAN, MD;  Location: [facility] INVASIVE CV LAB;  Service: Cardiovascular;  Laterality: Right;   Inominate Artery Stenosis requiring stenting   COLONOSCOPY  2016   COLONOSCOPY WITH PROPOFOL  N/A 06/29/2023   Procedure: COLONOSCOPY WITH PROPOFOL ;  Surgeon: Jinny Carmine, MD;  Location: [facility] ENDOSCOPY;  Service: Endoscopy;  Laterality: N/A;   ESOPHAGUS SURGERY     INTRAMEDULLARY (IM) NAIL INTERTROCHANTERIC Left 03/26/2017   Procedure: INTRAMEDULLARY (IM) NAIL INTERTROCHANTRIC;  Surgeon: Kathlynn Sharper, MD;  Location: [facility] ORS;  Service: Orthopedics;  Laterality: Left;   LEFT HEART CATH AND CORONARY ANGIOGRAPHY N/A 04/15/2021   Procedure: LEFT HEART CATH AND CORONARY ANGIOGRAPHY;  Surgeon: Darron Deatrice LABOR, MD;  Location: [facility] INVASIVE CV LAB;  Service: Cardiovascular;  Laterality: N/A;   RIGHT/LEFT HEART CATH AND CORONARY ANGIOGRAPHY Bilateral 05/15/2022   Procedure: RIGHT/LEFT HEART CATH AND CORONARY ANGIOGRAPHY;  Surgeon: Darron Deatrice LABOR, MD;  Location: [facility] INVASIVE CV LAB;  Service: Cardiovascular;  Laterality: Bilateral;   VIDEO BRONCHOSCOPY WITH ENDOBRONCHIAL NAVIGATION Right 06/28/2020   Procedure: VIDEO BRONCHOSCOPY WITH ENDOBRONCHIAL NAVIGATION CELLVIZIO;  Surgeon: Tamea Dedra CROME, MD;  Location: [facility] ORS;  Service: Pulmonary;  Laterality: Right;    Social History   Socioeconomic History   Marital status: Divorced    Spouse name: Not on file   Number of children: 4   Years of education: Not on file   Highest education level: 12th grade  Occupational History   Occupation: Retired    Comment: payroll  Tobacco Use   Smoking status: Former    Current packs/day: 0.00    Average packs/day: 1 pack/day for 61.0 years (61.0 ttl pk-yrs)    Types: Cigarettes    Start date: 03/25/1956    Quit date: 03/25/2017    Years since quitting: 6.9   Smokeless tobacco: Never  Vaping Use   Vaping status: Never Used  Substance and Sexual Activity   Alcohol use: No   Drug use: No   Sexual activity: Not Currently  Other Topics Concern   Not on file  Social History Narrative   Full Code    4 children   Denies alcohol use   Patient has 1 daughters and 1 grandson living with her   Social Drivers of Corporate investment banker Strain: Low Risk  (05/24/2023)   Overall Financial Resource Strain (CARDIA)    Difficulty of Paying Living Expenses: Not hard at all  Food Insecurity: No Food Insecurity (07/04/2023)   Hunger Vital Sign    Worried About Running Out of Food in the Last Year: Never true    Ran Out of Food in the Last Year: Never true  Transportation Needs: No Transportation Needs (07/04/2023)   PRAPARE - Administrator, Civil Service (Medical): No    Lack of Transportation (Non-Medical): No  Physical Activity: Insufficiently Active (05/24/2023)   Exercise Vital Sign    Days of Exercise per Week: 2 days    Minutes of Exercise per Session: 30 min  Stress: No Stress Concern Present (05/24/2023)   [employer] of Occupational Health - Occupational Stress Questionnaire    Feeling of Stress : Not at all  Recent Concern: Stress - Stress Concern  Present (04/02/2023)   [employer] of Occupational Health - Occupational Stress Questionnaire    Feeling of Stress : To some extent  Social Connections: Moderately Integrated (06/29/2023)   Social Connection and Isolation Panel    Frequency of Communication with Friends and Family: More than three times a week    Frequency of Social Gatherings with Friends and Family: More than three times a week    Attends Religious Services: More than 4 times per year    Active Member of [employer] or Organizations: Yes    Attends Banker Meetings: More than 4 times per year    Marital Status: Widowed  Intimate Partner Violence: Not At Risk (07/04/2023)   Humiliation, Afraid, Rape, and Kick questionnaire    Fear of Current or Ex-Partner: No    Emotionally Abused: No    Physically Abused: No    Sexually Abused: No    Family History  Problem Relation Age of Onset   Other Mother        Uremeic posioning    Kidney disease Mother    Heart attack Father    Prostate cancer Father    Cancer Father    Heart disease Father    Heart attack Brother    Diabetes Brother    Hypertension Daughter    Kidney cancer Neg Hx    Bladder Cancer Neg Hx    Colon cancer Neg Hx    Colon polyps Neg Hx    Rectal cancer Neg Hx      Current Outpatient Medications:    albuterol  (VENTOLIN  HFA) 108 (90 Base) MCG/ACT inhaler, Inhale 2 puffs into the lungs every 4 (four) hours as needed for wheezing or shortness of breath., Disp: 8 g, Rfl: 1   aspirin  81 MG EC tablet, Take 81 mg by mouth at bedtime. Swallow whole., Disp: , Rfl:    atorvastatin  (LIPITOR) 40 MG tablet, TAKE 1 TABLET BY MOUTH AT BEDTIME, Disp: 90 tablet, Rfl: 0   BRILINTA  90 MG TABS tablet, Take 1 tablet by mouth twice daily, Disp: 60 tablet, Rfl: 3   budeson-glycopyrrolate-formoterol (BREZTRI  AEROSPHERE) 160-9-4.8 MCG/ACT AERO inhaler, Inhale 2 puffs into the lungs 2 (two) times daily., Disp: 10.7 g, Rfl: 11   busPIRone  (BUSPAR ) 5 MG tablet, Take 1 tablet (5 mg total) by mouth 2 (two) times daily as needed (stress anxiety)., Disp: 60 tablet, Rfl: 1   calcium  carbonate (TUMS - DOSED IN MG ELEMENTAL CALCIUM ) 500 MG chewable tablet, Chew 1.5 tablets (300 mg of elemental calcium  total) by mouth every Monday, Tuesday, Wednesday, Thursday, and Friday., Disp: , Rfl:    Cholecalciferol  (VITAMIN D3) 50 MCG (2000 UT) TABS, Take 2,000 Units by mouth at bedtime., Disp: , Rfl:    ferrous sulfate  325 (65 FE) MG EC tablet, Take 1 tablet (325 mg total) by mouth every other day., Disp: 45 tablet, Rfl: 3   Melatonin 10 MG TABS, Take 10 mg by mouth at bedtime., Disp: , Rfl:    Multiple Vitamin (MULTIVITAMIN) capsule, Take 1 capsule by mouth daily., Disp: , Rfl:    PARoxetine  (PAXIL ) 40 MG tablet, Take 1 tablet (40 mg total) by mouth every morning., Disp: 90 tablet, Rfl: 2  Physical exam:  Vitals:   03/04/24 1457  BP: 126/80  Pulse: (!) 101  Resp: 19  Temp: 98.6 F (37  C)  SpO2: 98%  Weight: 114 lb (51.7 kg)   Physical Exam Constitutional:      Comments: Sitting in a wheelchair.  Appears in no acute distress  Cardiovascular:     Rate and Rhythm: Normal rate and regular rhythm.     Heart sounds: Normal heart sounds.  Pulmonary:     Effort: Pulmonary effort is normal.     Breath sounds: Normal breath sounds.  Abdominal:     General: Bowel sounds are normal.     Palpations: Abdomen is soft.  Skin:    General: Skin is warm and dry.  Neurological:     Mental Status: She is alert and oriented to person, place, and time.      I have personally reviewed labs listed below:    Latest Ref Rng & Units 03/02/2024   12:51 PM  CMP  Glucose 70 - 99 mg/dL 898   BUN 8 - 23 mg/dL 25   Creatinine 9.55 - 1.00 mg/dL 8.68   Sodium 864 - 854 mmol/L 139   Potassium 3.5 - 5.1 mmol/L 3.9   Chloride 98 - 111 mmol/L 106   CO2 22 - 32 mmol/L 24   Calcium  8.9 - 10.3 mg/dL 9.1   Total Protein 6.5 - 8.1 g/dL 7.3   Total Bilirubin 0.0 - 1.2 mg/dL 0.7   Alkaline Phos 38 - 126 U/L 83   AST 15 - 41 U/L 22   ALT 0 - 44 U/L 15       Latest Ref Rng & Units 03/04/2024    2:44 PM  CBC  WBC 4.0 - 10.5 K/uL 8.8   Hemoglobin 12.0 - 15.0 g/dL 89.9   Hematocrit 63.9 - 46.0 % 29.9   Platelets 150 - 400 K/uL 214    I have personally reviewed Radiology images listed below: No images are attached to the encounter.  CT ANGIO GI BLEED Result Date: 03/02/2024 CLINICAL DATA:  Rectal bleeding. EXAM: CTA ABDOMEN AND PELVIS WITHOUT AND WITH CONTRAST TECHNIQUE: Multidetector CT imaging of the abdomen and pelvis was performed using the standard protocol during bolus administration of intravenous contrast. Multiplanar reconstructed images and MIPs were obtained and reviewed to evaluate the vascular anatomy. RADIATION DOSE REDUCTION: This exam was performed according to the departmental dose-optimization program which includes automated exposure control, adjustment of the mA and/or kV  according to patient size and/or use of iterative reconstruction technique. CONTRAST:  75mL OMNIPAQUE  IOHEXOL  350 MG/ML SOLN COMPARISON:  None Available. FINDINGS: VASCULAR Aorta: Aortic atherosclerosis with mural thrombus. 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 No dissection is seen. There is a focal out pouching with ulceration along the right-side of the infrarenal abdominal aorta measuring 1.1 x 0.6 cm, axial image 76. Celiac: Patent without evidence of aneurysm, dissection, vasculitis or significant stenosis. SMA: Patent without evidence of aneurysm, dissection, vasculitis or significant stenosis. Renals: Sclerosis at the ostia bilaterally without evidence of stenosis or dissection. 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 IMA: Patent. Inflow: Extensive atherosclerotic calcification. Patent without evidence of aneurysm, vasculitis or significant stenosis. A small dissection flap is present in the external iliac artery on the right. Proximal Outflow: Bilateral common femoral and visualized portions of the superficial and profunda femoral arteries are patent without evidence of aneurysm, dissection, vasculitis or significant stenosis. Veins: No obvious venous abnormality within the limitations of this arterial phase study. Review of the MIP images confirms the above findings. NON-VASCULAR Lower chest: There is a small right pleural effusion with consolidation in the right lower lobe. Debris is noted with occlusion right lower lobe bronchioles,  unchanged from the prior exam. Patchy airspace disease is present at the right lung base. Hepatobiliary: No focal liver abnormality is seen. Fatty infiltration is noted. No gallstones, gallbladder wall thickening, or biliary dilatation. Pancreas: Unremarkable. No pancreatic ductal dilatation or surrounding inflammatory changes. Spleen: Normal size. There is a stable subcapsular hypoattenuating area  along the posterior aspect of the spleen. No perisplenic free fluid is seen. Adrenals/Urinary Tract: The adrenal glands are within normal limits. The kidneys enhance symmetrically. Multifocal renal cortical scarring is noted. Renal calculi are present bilaterally. No hydronephrosis. The bladder is within normal limits. Stomach/Bowel: The stomach is within normal limits. No bowel obstruction, free air, or pneumatosis is seen. Scattered diverticula are noted along the colon without evidence of diverticulitis. Appendix appears normal. No acute or active hemorrhage is seen. Lymphatic: No abdominal or pelvic lymphadenopathy by size criteria. Reproductive: Uterus and bilateral adnexa are unremarkable. Other: No abdominopelvic ascites. A fat containing umbilical hernia is present. Musculoskeletal: Degenerative changes are present in the thoracolumbar spine. Fixation hardware is noted in the proximal left femur. Stable compression deformities are present at L1 and T11. No acute osseous abnormality. IMPRESSION: VASCULAR 1. No evidence of acute or active hemorrhage. 2. 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 Recommend follow-up ultrasound every 3 years. (Ref.: J Vasc Surg. 2018; 67:2-77 and J Am Coll Radiol 2013;10(10):789-794.) 3. Focal outpouching of the infrarenal abdominal aorta with ulceration as described above. 4. 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 NON-VASCULAR 1. Diverticulosis without diverticulitis. 2. Small right pleural effusion with right lower lobe consolidation, not significantly changed. Left basilar airspace disease is new from the previous exam. 3. Hepatic steatosis. 4. Bilateral nephrolithiasis. 5. Stable a subcapsular splenic collection. Electronically Signed   By: Leita Birmingham M.D.   On: 03/02/2024 16:51   VAS US  ABI WITH/WO TBI Result Date: 02/13/2024  LOWER EXTREMITY DOPPLER STUDY Patient Name:  VERNIDA MCNICHOLAS  Date of  Exam:   02/12/2024 Medical Rec #: [id number]           Accession #:    [id number] Date of Birth: Feb 09, 1941           Patient Gender: F Patient Age:   17 years Exam Location:  [facility] Procedure:      VAS US  ABI WITH/WO TBI Referring Phys: Selinda Gu --------------------------------------------------------------------------------  Indications: Peripheral artery disease. High Risk Factors: Hypertension, hyperlipidemia, coronary artery disease.  Vascular Interventions: 04/20/2023 PTA of right iliac artery and stent of right                         innominate artery. Comparison Study: 05/18/2023 Performing Technologist: Leafy Gibes RVS  Examination Guidelines: A complete evaluation includes at minimum, Doppler waveform signals and systolic blood pressure reading at the level of bilateral brachial, anterior tibial, and posterior tibial arteries, when vessel segments are accessible. Bilateral testing is considered an integral part of a complete examination. Photoelectric Plethysmograph (PPG) waveforms and toe systolic pressure readings are included as required and additional duplex testing as needed. Limited examinations for reoccurring indications may be performed as noted.  ABI Findings: +---------+------------------+-----+---------+--------+ Right    Rt Pressure (mmHg)IndexWaveform Comment  +---------+------------------+-----+---------+--------+ Brachial 130                                      +---------+------------------+-----+---------+--------+ ATA  119               0.92 triphasic         +---------+------------------+-----+---------+--------+ PTA      130               1.00 triphasic         +---------+------------------+-----+---------+--------+ Great Toe136               1.05 Normal            +---------+------------------+-----+---------+--------+ +---------+------------------+-----+---------+-------+ Left     Lt Pressure (mmHg)IndexWaveform Comment  +---------+------------------+-----+---------+-------+ Brachial 123                                     +---------+------------------+-----+---------+-------+ ATA      141               1.08 triphasic        +---------+------------------+-----+---------+-------+ PTA      141               1.08 triphasic        +---------+------------------+-----+---------+-------+ Great Toe119               0.92 Normal           +---------+------------------+-----+---------+-------+ +-------+-----------+-----------+------------+------------+ ABI/TBIToday's ABIToday's TBIPrevious ABIPrevious TBI +-------+-----------+-----------+------------+------------+ Right  1.0        1.05       1.04        .67          +-------+-----------+-----------+------------+------------+ Left   1.09       .92        .95         .69          +-------+-----------+-----------+------------+------------+  Bilateral TBIs appear increased compared to prior study on 05/18/2023. Bilateral ABIs appear essentially unchanged compared to prior study on 05/18/2023.  Summary: Right: Resting right ankle-brachial index is within normal range. The right toe-brachial index is normal.  Left: Resting left ankle-brachial index is within normal range. The left toe-brachial index is normal.  *See table(s) above for measurements and observations.  Electronically signed by Selinda Gu MD on 02/13/2024 at 7:24:43 AM.    Final    VAS US  CAROTID Result Date: 02/13/2024 Carotid Arterial Duplex Study Patient Name:  SHAKETA SERAFIN  Date of Exam:   02/12/2024 Medical Rec #: [id number]           Accession #:    [id number] Date of Birth: November 30, 1940           Patient Gender: F Patient Age:   10 years Exam Location:  [facility] Procedure:      VAS US  CAROTID Referring Phys: SELINDA GU --------------------------------------------------------------------------------  Indications:       Carotid artery disease. Risk Factors:       Hypertension. Other Factors:     04/20/2023 PTA of right iliac artery and stent of right                    innominate artery. Comparison Study:  08/21/2023; In comparison to prior study the Left ICA is                    1-39% verses 40-59% in previous study. Performing Technologist: Leafy Gibes RVS  Examination Guidelines: A complete evaluation includes B-mode imaging, spectral Doppler, color Doppler, and power Doppler as needed of all  accessible portions of each vessel. Bilateral testing is considered an integral part of a complete examination. Limited examinations for reoccurring indications may be performed as noted.  Right Carotid Findings: +----------+--------+--------+--------+------------------+--------+           PSV cm/sEDV cm/sStenosisPlaque DescriptionComments +----------+--------+--------+--------+------------------+--------+ CCA Prox  74      10                                         +----------+--------+--------+--------+------------------+--------+ CCA Mid   68      12                                         +----------+--------+--------+--------+------------------+--------+ CCA Distal65      12                                         +----------+--------+--------+--------+------------------+--------+ ICA Prox  80      14                                         +----------+--------+--------+--------+------------------+--------+ ICA Mid   74      17                                         +----------+--------+--------+--------+------------------+--------+ ICA Distal73      17                                         +----------+--------+--------+--------+------------------+--------+ ECA       95      00                                         +----------+--------+--------+--------+------------------+--------+ +----------+--------+-------+--------+-------------------+           PSV cm/sEDV cmsDescribeArm Pressure (mmHG)  +----------+--------+-------+--------+-------------------+ Subclavian100     0                                  +----------+--------+-------+--------+-------------------+ +---------+--------+--+--------+-+ VertebralPSV cm/s46EDV cm/s9 +---------+--------+--+--------+-+  Left Carotid Findings: +----------+--------+--------+--------+------------------+--------+           PSV cm/sEDV cm/sStenosisPlaque DescriptionComments +----------+--------+--------+--------+------------------+--------+ CCA Prox  74      10                                         +----------+--------+--------+--------+------------------+--------+ CCA Mid   59      18                                         +----------+--------+--------+--------+------------------+--------+ CCA Distal58      15                                         +----------+--------+--------+--------+------------------+--------+  ICA Prox  109     25                                         +----------+--------+--------+--------+------------------+--------+ ICA Mid   100     22                                         +----------+--------+--------+--------+------------------+--------+ ICA Distal80      19                                         +----------+--------+--------+--------+------------------+--------+ ECA       182     6                                          +----------+--------+--------+--------+------------------+--------+ +----------+--------+--------+--------+-------------------+           PSV cm/sEDV cm/sDescribeArm Pressure (mmHG) +----------+--------+--------+--------+-------------------+ [id number]      9                                   +----------+--------+--------+--------+-------------------+ +---------+--------+--+--------+-+ VertebralPSV cm/s25EDV cm/s6 +---------+--------+--+--------+-+   Summary: Right Carotid: Velocities in the right ICA are consistent with a 1-39%  stenosis. Left Carotid: Velocities in the left ICA are consistent with a 1-39% stenosis. Vertebrals:  Bilateral vertebral arteries demonstrate antegrade flow. Subclavians: Normal flow hemodynamics were seen in bilateral subclavian              arteries. *See table(s) above for measurements and observations.  Electronically signed by Selinda Gu MD on 02/13/2024 at 7:24:09 AM.    Final      Assessment and plan- Patient is a 83 y.o. female for a routine follow up the following issues:   Surveillance lung cancer: Patient had a CT chest back in November 2024 which did not show any evidence of recurrent disease.  We are getting CT scans in a yearly basis and I will schedule him for next month.  History of iron deficiency anemia: Patient has baseline anemia with a Hgb/29.9 with an MCV of 103.5 which is at her baseline iron studies are indicative of anemia of chronic disease and would not benefit from IV iron.  Diarrhea: Checking stool studies today and if C. difficile testing was negative she can take over-the-counter as needed Imodium    Visit Diagnosis 1. Iron deficiency anemia, unspecified iron deficiency anemia type   2. Encounter for follow-up surveillance of lung cancer   3. Diarrhea, unspecified type      Dr. Annah Skene, MD, MPH [facility] [phone] 03/09/2024 6:37 PM

## 2024-03-10 NOTE — Telephone Encounter (Signed)
 Voicemail received from patient on Fri 03/07/24 at 4:24PM returning a missed call; best call back number is [phone].  Per Dr. Darold note Please let her know that her C. difficile testing is negative and she can take Imodium as needed for her diarrhea.  Outbound call; voice message left and also indicated on message that a my chart message would be sent covering the details that would have been discussed.

## 2024-03-12 DIAGNOSIS — I10 Essential (primary) hypertension: Secondary | ICD-10-CM | POA: Diagnosis not present

## 2024-03-12 DIAGNOSIS — Z7901 Long term (current) use of anticoagulants: Secondary | ICD-10-CM | POA: Diagnosis not present

## 2024-03-12 DIAGNOSIS — M8000XD Age-related osteoporosis with current pathological fracture, unspecified site, subsequent encounter for fracture with routine healing: Secondary | ICD-10-CM | POA: Diagnosis not present

## 2024-03-12 DIAGNOSIS — I480 Paroxysmal atrial fibrillation: Secondary | ICD-10-CM | POA: Diagnosis not present

## 2024-03-12 DIAGNOSIS — D500 Iron deficiency anemia secondary to blood loss (chronic): Secondary | ICD-10-CM | POA: Diagnosis not present

## 2024-03-20 ENCOUNTER — Ambulatory Visit: Payer: Self-pay

## 2024-03-20 DIAGNOSIS — N2581 Secondary hyperparathyroidism of renal origin: Secondary | ICD-10-CM | POA: Diagnosis not present

## 2024-03-20 DIAGNOSIS — D631 Anemia in chronic kidney disease: Secondary | ICD-10-CM | POA: Diagnosis not present

## 2024-03-20 DIAGNOSIS — N1832 Chronic kidney disease, stage 3b: Secondary | ICD-10-CM | POA: Diagnosis not present

## 2024-03-20 NOTE — Telephone Encounter (Signed)
 FYI Only or Action Required?: Action required by provider: request for appointment.  Patient is followed in Pulmonology for copd, last seen on 04/04/2022 by Tamea Dedra CROME, MD.  Called Nurse Triage reporting Cough.  Symptoms began several days ago.  Interventions attempted: Nothing.  Symptoms are: gradually worsening.  Triage Disposition: See Physician Within 24 Hours  Patient/caregiver understands and will follow disposition?: No, wishes to speak with PCP       Copied from CRM #[id number]. Topic: Clinical - Red Word Triage >> Mar 20, 2024  3:25 PM Celestine FALCON wrote: Red Word that prompted transfer to Nurse Triage: Pt recently saw her kidney doctor who listened to her lungs, and told her she isn't getting enough air to her lungs.  She stated she's also having a cough and extreme fatigue (all she does is sleep).   Pt sees Dr. Tamea at the [facility]. Reason for Disposition  [1] Continuous (nonstop) coughing interferes with work or school AND [2] no improvement using cough treatment per Care Advice  Answer Assessment - Initial Assessment Questions Kidney doctor told her she's Not utilizing lungs  1. ONSET: When did the cough begin?      Last Sunday 2. SEVERITY: How bad is the cough today?      No deep enough. Wants to go deeper to get it out 3. SPUTUM: Describe the color of your sputum (e.g., none, dry cough; clear, white, yellow, green)     Can get it up but not out 4. HEMOPTYSIS: Are you coughing up any blood? If Yes, ask: How much? (e.g., flecks, streaks, tablespoons, etc.)     no 5. DIFFICULTY BREATHING: Are you having difficulty breathing? If Yes, ask: How bad is it? (e.g., mild, moderate, severe)      Not more than normal  6. FEVER: Do you have a fever? If Yes, ask: What is your temperature, how was it measured, and when did it start?     no 7. CARDIAC HISTORY: Do you have any history of heart disease? (e.g., heart attack, congestive  heart failure)       8. LUNG HISTORY: Do you have any history of lung disease?  (e.g., pulmonary embolus, asthma, emphysema)     COPD  10. OTHER SYMPTOMS: Do you have any other symptoms? (e.g., runny nose, wheezing, chest pain)       Runny nose yes, no chest pain  Protocols used: Cough - Acute Non-Productive-A-AH

## 2024-03-20 NOTE — Telephone Encounter (Signed)
 She has not been seen in 2 years, she should need to be seen in person recommend check with primary care or urgent care.  Could also be seen by an APP if available.

## 2024-03-20 NOTE — Telephone Encounter (Signed)
 Appt scheduled with Izetta Rouleau, NP. NFN.

## 2024-03-25 ENCOUNTER — Ambulatory Visit: Admitting: Family Medicine

## 2024-03-25 DIAGNOSIS — N2581 Secondary hyperparathyroidism of renal origin: Secondary | ICD-10-CM | POA: Diagnosis not present

## 2024-03-25 DIAGNOSIS — D631 Anemia in chronic kidney disease: Secondary | ICD-10-CM | POA: Diagnosis not present

## 2024-03-25 DIAGNOSIS — N1832 Chronic kidney disease, stage 3b: Secondary | ICD-10-CM | POA: Diagnosis not present

## 2024-03-26 ENCOUNTER — Ambulatory Visit: Admitting: Family Medicine

## 2024-03-27 ENCOUNTER — Encounter: Payer: Self-pay | Admitting: Nurse Practitioner

## 2024-03-27 ENCOUNTER — Ambulatory Visit: Admitting: Nurse Practitioner

## 2024-03-27 VITALS — BP 138/74 | HR 105 | Ht 66.0 in | Wt 116.0 lb

## 2024-03-27 DIAGNOSIS — R5381 Other malaise: Secondary | ICD-10-CM | POA: Diagnosis not present

## 2024-03-27 DIAGNOSIS — R634 Abnormal weight loss: Secondary | ICD-10-CM | POA: Diagnosis not present

## 2024-03-27 DIAGNOSIS — C3431 Malignant neoplasm of lower lobe, right bronchus or lung: Secondary | ICD-10-CM | POA: Diagnosis not present

## 2024-03-27 DIAGNOSIS — G4734 Idiopathic sleep related nonobstructive alveolar hypoventilation: Secondary | ICD-10-CM | POA: Diagnosis not present

## 2024-03-27 DIAGNOSIS — J449 Chronic obstructive pulmonary disease, unspecified: Secondary | ICD-10-CM

## 2024-03-27 NOTE — Progress Notes (Signed)
 @Patient  ID: Christine Burnett, female    DOB: May 08, 1941, 83 y.o.   MRN: [id number]  Chief Complaint  Patient presents with   COPD    Referring provider: Leavy Mole, PA-C  HPI: 83 year old female, former smoker followed for COPD, NSCLC s/p SBRT, nocturnal hypoxia. She is a patient of Dr. Tamea and last seen in office 04/04/2022. Past medical history significant for hx of TIA, PAD, HTN, CAD, AAA without rupture, hyperparathyroid, CKD, HLD.   TEST/EVENTS:  12/29/2020 PFT: FVC 79, FEV1 71, ratio 68, normal DLCO 04/22/2023 CT chest: atherosclerosis. 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 Trace right pleural effusion lying dependently. Diffuse bronchial wall thickening with moderate emphysema. Chronic compression fx of T7 and T9.   04/04/2022: OV with Dr. Tamea. Unsure if Breztri  and albuterol  help. Not currently using any respiratory medications. Occasionally use albuterol . Has issues with DOE. Has significant CAD. Follows with cardiology. She appears frail. Uses oxygen  at night and occasionally with exertion.   03/27/2024: Today - follow up Discussed the use of AI scribe software for clinical note transcription with the patient, who gave verbal consent to proceed.  History of Present Illness Christine Burnett is an 83 year old female with COPD who presents for follow up. She is accompanied by her daughters. She was referred by her kidney doctor, Dr. Lazarus for evaluation of her breathing issues.  She uses oxygen  at night and typically does not require it during the day. She uses Breztri  once a day, although it is prescribed twice daily. She experiences occasional panic attacks, which makes her more short winded. Other than this, she feels her breathing is actually doing well. She does get fatigued with exertion and relates this to being very sedentary. She has minimal coughing without hemoptysis or  significant sputum production. No wheezing. No exacerbations requiring steroids/abx.   When she saw Dr. Lazarus, she had chest congestion related to a cold. These symptoms have since resolved. No fevers, chills.   She has experienced a weight loss of ten pounds over the last six months, attributed to decreased appetite. Weight has since stabilized and she's actually gained 2 lb since September. She denies any night sweats. She has an upcoming CT chest scan.   Doesn't ever use her albuterol .     Allergies  Allergen Reactions   Zithromax  [Azithromycin ]    Cefuroxime Axetil Rash   Ciprofloxacin  Nausea Only    Unknown   Codeine Other (See Comments)    Patient does not like how the medication makes you feel     Immunization History  Administered Date(s) Administered   Fluad Quad(high Dose 65+) 06/27/2019, 02/08/2021, 04/04/2022   Fluad Trivalent(High Dose 65+) 04/03/2023   INFLUENZA, HIGH DOSE SEASONAL PF 04/07/2015, 03/22/2016, 03/13/2017, 02/25/2018   Influenza Split 03/15/2010, 03/21/2011   Influenza,inj,Quad PF,6+ Mos 03/19/2013, 03/10/2014   Pneumococcal Conjugate-13 11/08/2017   Pneumococcal Polysaccharide-23 01/21/2019   Tdap 05/29/2017    Past Medical History:  Diagnosis Date   Allergic rhinitis    Anxiety    Arthritis Me   Chronic bronchitis (HCC)    Chronic kidney disease, stage III (moderate) (HCC) 08/31/2017   Seeing nephrologist   CKD (chronic kidney disease), stage III (HCC)    COPD (chronic obstructive pulmonary disease) (HCC)    Cornea scar    Depression    unspecified   Diverticulosis    Emphysema of lung (HCC) Me   Hx of fracture  of left hip 12/24/2017   Hyperlipidemia    Hyperparathyroidism, 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   Managed by nephrologist   Lower GI bleed 2025   Non-small cell lung cancer (NSCLC) (HCC)    Osteoporosis    Oxygen  deficiency Me   Risk for falls    Situational disturbance    Stroke (HCC) 2011   tia's x 2   TIA (transient  ischemic attack)     Tobacco History: Social History   Tobacco Use  Smoking Status Former   Current packs/day: 0.00   Average packs/day: 1 pack/day for 61.0 years (61.0 ttl pk-yrs)   Types: Cigarettes   Start date: 03/25/1956   Quit date: 03/25/2017   Years since quitting: 7.0  Smokeless Tobacco Never   Counseling given: Not Answered   Outpatient Medications Prior to Visit  Medication Sig Dispense Refill   albuterol  (VENTOLIN  HFA) 108 (90 Base) MCG/ACT inhaler Inhale 2 puffs into the lungs every 4 (four) hours as needed for wheezing or shortness of breath. 8 g 1   aspirin  81 MG EC tablet Take 81 mg by mouth at bedtime. Swallow whole.     atorvastatin  (LIPITOR) 40 MG tablet TAKE 1 TABLET BY MOUTH AT BEDTIME 90 tablet 0   BRILINTA  90 MG TABS tablet Take 1 tablet by mouth twice daily 60 tablet 3   budeson-glycopyrrolate-formoterol (BREZTRI  AEROSPHERE) 160-9-4.8 MCG/ACT AERO inhaler Inhale 2 puffs into the lungs 2 (two) times daily. 10.7 g 11   busPIRone  (BUSPAR ) 5 MG tablet Take 1 tablet (5 mg total) by mouth 2 (two) times daily as needed (stress anxiety). 60 tablet 1   calcium  carbonate (TUMS - DOSED IN MG ELEMENTAL CALCIUM ) 500 MG chewable tablet Chew 1.5 tablets (300 mg of elemental calcium  total) by mouth every Monday, Tuesday, Wednesday, Thursday, and Friday.     Cholecalciferol  (VITAMIN D3) 50 MCG (2000 UT) TABS Take 2,000 Units by mouth at bedtime.     ferrous sulfate  325 (65 FE) MG EC tablet Take 1 tablet (325 mg total) by mouth every other day. 45 tablet 3   Melatonin 10 MG TABS Take 10 mg by mouth at bedtime.     Multiple Vitamin (MULTIVITAMIN) capsule Take 1 capsule by mouth daily.     PARoxetine  (PAXIL ) 40 MG tablet Take 1 tablet (40 mg total) by mouth every morning. 90 tablet 2   No facility-administered medications prior to visit.     Review of Systems: as above    Physical Exam:  BP 138/74   Pulse (!) 105   Ht 5' 6 (1.676 m)   Wt 116 lb (52.6 kg)   SpO2  96%   BMI 18.72 kg/m   GEN: Pleasant, interactive, well-kempt; elderly; in no acute distress HEENT:  Normocephalic and atraumatic. PERRLA. Sclera white. Nasal turbinates pink, moist and patent bilaterally. No rhinorrhea present. Oropharynx pink and moist, without exudate or edema. No lesions, ulcerations, or postnasal drip.  NECK:  Supple w/ fair ROM. No JVD present. No lymphadenopathy.   CV: RRR, no m/r/g, no peripheral edema. Pulses intact, +2 bilaterally. No cyanosis, pallor or clubbing. PULMONARY:  Unlabored, regular breathing. Clear bilaterally A&P w/o wheezes/rales/rhonchi. No accessory muscle use.  GI: BS present and normoactive. Soft, non-tender to palpation.  MSK: No erythema, warmth or tenderness.  Neuro: A/Ox3. No focal deficits noted.   Skin: Warm, no lesions or rashe Psych: Normal affect and behavior. Judgement and thought content appropriate.     Lab Results:  CBC    Component Value  Date/Time   WBC 8.8 03/04/2024 1444   WBC 6.5 03/02/2024 1251   RBC 2.89 (L) 03/04/2024 1444   HGB 10.0 (L) 03/04/2024 1444   HGB 12.8 04/13/2021 1122   HCT 29.9 (L) 03/04/2024 1444   HCT 38.0 04/13/2021 1122   PLT 214 03/04/2024 1444   PLT 235 04/13/2021 1122   MCV 103.5 (H) 03/04/2024 1444   MCV 98 (H) 04/13/2021 1122   MCV 100 07/03/2014 0526   MCH 34.6 (H) 03/04/2024 1444   MCHC 33.4 03/04/2024 1444   RDW 13.7 03/04/2024 1444   RDW 11.9 04/13/2021 1122   RDW 13.5 07/03/2014 0526   LYMPHSABS 1.0 03/04/2024 1444   LYMPHSABS 2.9 01/15/2015 1500   LYMPHSABS 2.8 07/03/2014 0526   MONOABS 0.5 03/04/2024 1444   MONOABS 0.5 07/03/2014 0526   EOSABS 0.1 03/04/2024 1444   EOSABS 0.3 01/15/2015 1500   EOSABS 0.3 07/03/2014 0526   BASOSABS 0.0 03/04/2024 1444   BASOSABS 0.0 01/15/2015 1500   BASOSABS 0.1 07/03/2014 0526    BMET    Component Value Date/Time   NA 139 03/02/2024 1251   NA 141 04/13/2021 1122   NA 141 07/03/2014 0526   K 3.9 03/02/2024 1251   K 3.9 07/03/2014  0526   CL 106 03/02/2024 1251   CL 109 (H) 07/03/2014 0526   CO2 24 03/02/2024 1251   CO2 25 07/03/2014 0526   GLUCOSE 101 (H) 03/02/2024 1251   GLUCOSE 90 07/03/2014 0526   BUN 25 (H) 03/02/2024 1251   BUN 25 04/13/2021 1122   BUN 15 07/03/2014 0526   CREATININE 1.31 (H) 03/02/2024 1251   CREATININE 1.58 (H) 05/24/2023 1524   CALCIUM  9.1 03/02/2024 1251   CALCIUM  9.3 07/03/2014 0526   GFRNONAA 40 (L) 03/02/2024 1251   GFRNONAA 32 (L) 05/03/2020 1359   GFRAA 37 (L) 05/03/2020 1359    BNP    Component Value Date/Time   BNP 261 (H) 07/02/2014 1735     Imaging:  CT ANGIO GI BLEED Result Date: 03/02/2024 CLINICAL DATA:  Rectal bleeding. EXAM: CTA ABDOMEN AND PELVIS WITHOUT AND WITH CONTRAST TECHNIQUE: Multidetector CT imaging of the abdomen and pelvis was performed using the standard protocol during bolus administration of intravenous contrast. Multiplanar reconstructed images and MIPs were obtained and reviewed to evaluate the vascular anatomy. RADIATION DOSE REDUCTION: This exam was performed according to the departmental dose-optimization program which includes automated exposure control, adjustment of the mA and/or kV according to patient size and/or use of iterative reconstruction technique. CONTRAST:  75mL OMNIPAQUE  IOHEXOL  350 MG/ML SOLN COMPARISON:  None Available. FINDINGS: VASCULAR Aorta: Aortic atherosclerosis with mural thrombus. 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 No dissection is seen. There is a focal out pouching with ulceration along the right-side of the infrarenal abdominal aorta measuring 1.1 x 0.6 cm, axial image 76. Celiac: Patent without evidence of aneurysm, dissection, vasculitis or significant stenosis. SMA: Patent without evidence of aneurysm, dissection, vasculitis or significant stenosis. Renals: Sclerosis at the ostia bilaterally without evidence of stenosis or dissection. 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 IMA: Patent. Inflow: Extensive atherosclerotic calcification. Patent without evidence of aneurysm, vasculitis or significant stenosis. A small dissection flap is present in the external iliac artery on the right. Proximal Outflow: Bilateral common femoral and visualized portions of the superficial and profunda femoral arteries are patent without evidence of aneurysm, dissection, vasculitis or significant stenosis. Veins: No obvious  venous abnormality within the limitations of this arterial phase study. Review of the MIP images confirms the above findings. NON-VASCULAR Lower chest: There is a small right pleural effusion with consolidation in the right lower lobe. Debris is noted with occlusion right lower lobe bronchioles, unchanged from the prior exam. Patchy airspace disease is present at the right lung base. Hepatobiliary: No focal liver abnormality is seen. Fatty infiltration is noted. No gallstones, gallbladder wall thickening, or biliary dilatation. Pancreas: Unremarkable. No pancreatic ductal dilatation or surrounding inflammatory changes. Spleen: Normal size. There is a stable subcapsular hypoattenuating area along the posterior aspect of the spleen. No perisplenic free fluid is seen. Adrenals/Urinary Tract: The adrenal glands are within normal limits. The kidneys enhance symmetrically. Multifocal renal cortical scarring is noted. Renal calculi are present bilaterally. No hydronephrosis. The bladder is within normal limits. Stomach/Bowel: The stomach is within normal limits. No bowel obstruction, free air, or pneumatosis is seen. Scattered diverticula are noted along the colon without evidence of diverticulitis. Appendix appears normal. No acute or active hemorrhage is seen. Lymphatic: No abdominal or pelvic lymphadenopathy by size criteria. Reproductive: Uterus and bilateral adnexa are unremarkable. Other: No abdominopelvic ascites. A fat containing umbilical hernia is  present. Musculoskeletal: Degenerative changes are present in the thoracolumbar spine. Fixation hardware is noted in the proximal left femur. Stable compression deformities are present at L1 and T11. No acute osseous abnormality. IMPRESSION: VASCULAR 1. No evidence of acute or active hemorrhage. 2. 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 Recommend follow-up ultrasound every 3 years. (Ref.: J Vasc Surg. 2018; 67:2-77 and J Am Coll Radiol 2013;10(10):789-794.) 3. Focal outpouching of the infrarenal abdominal aorta with ulceration as described above. 4. 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 NON-VASCULAR 1. Diverticulosis without diverticulitis. 2. Small right pleural effusion with right lower lobe consolidation, not significantly changed. Left basilar airspace disease is new from the previous exam. 3. Hepatic steatosis. 4. Bilateral nephrolithiasis. 5. Stable a subcapsular splenic collection. Electronically Signed   By: Leita Birmingham M.D.   On: 03/02/2024 16:51    Administration History     None           No data to display          No results found for: NITRICOXIDE      Assessment & Plan:   COPD (chronic obstructive pulmonary disease) (HCC) Moderate COPD. Stable on current regimen. Reviewed proper dosing of Breztri  being bid. Verbalized understanding. Encouraged her to work on graded exercises and conditioning. No recent exacerbations requiring steroids/abx. No hospitalizations for her breathing. Action plan in place.  Patient Instructions  Continue Albuterol  inhaler 2 puffs every 6 hours as needed for shortness of breath or wheezing. Notify if symptoms persist despite rescue inhaler/neb use. Continue Breztri  2 puffs Twice daily. Brush tongue and rinse mouth afterwards  Let me know if you would like to look at pulmonary rehab Work on graded exercises at home  Attend your CT chest scan as scheduled   Follow  up in 6 months with Dr. Tamea. If symptoms do not improve or worsen, please contact office for sooner follow up or seek emergency care.    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 Repeat CT chest scheduled for 04/07/2024. She has had some recent weight loss but seems to have stabilized. Follow up with Dr. Melanee as scheduled  Nocturnal hypoxia Continue supplemental oxygen  2 lpm at night. Goal> 88-90%  Physical  deconditioning Encouraged to work on graded exercises. Offered referral to pulmonary rehab, including virtual option. Declined for now. She will let us  know if she changes her mind.  Weight loss Advised to monitor. Appears to have stabilized. Attention on follow up CT chest. Monitor at home. Notify PCP if weight loss persists. Encourage to focus on small, frequent, high protein/calorie meals. Utilize supplements such as Boost or Ensures.    Advised if symptoms do not improve or worsen, to please contact office for sooner follow up or seek emergency care.   I spent 35 minutes of dedicated to the care of this patient on the date of this encounter to include pre-visit review of records, face-to-face time with the patient discussing conditions above, post visit ordering of testing, clinical documentation with the electronic health record, making appropriate referrals as documented, and communicating necessary findings to members of the patients care team.  Comer LULLA Rouleau, NP 03/27/2024  Pt aware and understands NP's role.

## 2024-03-27 NOTE — Assessment & Plan Note (Signed)
 Encouraged to work on graded exercises. Offered referral to pulmonary rehab, including virtual option. Declined for now. She will let us  know if she changes her mind.

## 2024-03-27 NOTE — Assessment & Plan Note (Signed)
 Moderate COPD. Stable on current regimen. Reviewed proper dosing of Breztri  being bid. Verbalized understanding. Encouraged her to work on graded exercises and conditioning. No recent exacerbations requiring steroids/abx. No hospitalizations for her breathing. Action plan in place.  Patient Instructions  Continue Albuterol  inhaler 2 puffs every 6 hours as needed for shortness of breath or wheezing. Notify if symptoms persist despite rescue inhaler/neb use. Continue Breztri  2 puffs Twice daily. Brush tongue and rinse mouth afterwards  Let me know if you would like to look at pulmonary rehab Work on graded exercises at home  Attend your CT chest scan as scheduled   Follow up in 6 months with Dr. Tamea. If symptoms do not improve or worsen, please contact office for sooner follow up or seek emergency care.

## 2024-03-27 NOTE — Assessment & Plan Note (Signed)
 Advised to monitor. Appears to have stabilized. Attention on follow up CT chest. Monitor at home. Notify PCP if weight loss persists. Encourage to focus on small, frequent, high protein/calorie meals. Utilize supplements such as Boost or Ensures.

## 2024-03-27 NOTE — Assessment & Plan Note (Signed)
Continue supplemental oxygen 2 lpm at night. Goal >88-90%

## 2024-03-27 NOTE — Assessment & Plan Note (Signed)
 S/p SBRT. Repeat CT chest scheduled for 04/07/2024. She has had some recent weight loss but seems to have stabilized. Follow up with Dr. Melanee as scheduled

## 2024-03-27 NOTE — Patient Instructions (Signed)
 Continue Albuterol  inhaler 2 puffs every 6 hours as needed for shortness of breath or wheezing. Notify if symptoms persist despite rescue inhaler/neb use. Continue Breztri  2 puffs Twice daily. Brush tongue and rinse mouth afterwards  Let me know if you would like to look at pulmonary rehab Work on graded exercises at home  Attend your CT chest scan as scheduled   Follow up in 6 months with Dr. Tamea. If symptoms do not improve or worsen, please contact office for sooner follow up or seek emergency care.

## 2024-04-07 ENCOUNTER — Ambulatory Visit
Admission: RE | Admit: 2024-04-07 | Discharge: 2024-04-07 | Disposition: A | Discharge status: Home / Self Care | Source: Ambulatory Visit | Attending: Oncology | Admitting: Oncology

## 2024-04-07 DIAGNOSIS — Z08 Encounter for follow-up examination after completed treatment for malignant neoplasm: Secondary | ICD-10-CM | POA: Insufficient documentation

## 2024-04-07 DIAGNOSIS — Z85118 Personal history of other malignant neoplasm of bronchus and lung: Secondary | ICD-10-CM | POA: Insufficient documentation

## 2024-04-07 DIAGNOSIS — J90 Pleural effusion, not elsewhere classified: Secondary | ICD-10-CM | POA: Diagnosis not present

## 2024-04-07 DIAGNOSIS — I700 Atherosclerosis of aorta: Secondary | ICD-10-CM | POA: Diagnosis not present

## 2024-04-07 DIAGNOSIS — C3490 Malignant neoplasm of unspecified part of unspecified bronchus or lung: Secondary | ICD-10-CM | POA: Diagnosis not present

## 2024-04-08 ENCOUNTER — Ambulatory Visit: Admitting: Family Medicine

## 2024-04-14 ENCOUNTER — Encounter: Payer: Self-pay | Admitting: Oncology

## 2024-04-14 ENCOUNTER — Other Ambulatory Visit: Payer: Self-pay | Admitting: Oncology

## 2024-04-14 NOTE — Telephone Encounter (Signed)
-----   Message from Annah JAYSON Skene sent at 04/13/2024 11:18 AM EST ----- Please ask patient if she is having any symptoms of pneumonia. We can give her a 10 day course of augmentin  in that case and repeat CT chest in 2 months ----- Message ----- From: Interface, Rad Results In Sent: 04/12/2024  12:56 PM EST To: Annah JAYSON Skene, MD

## 2024-04-14 NOTE — Telephone Encounter (Signed)
 Per Dr. Melanee Please ask patient if she is having any symptoms of pneumonia. We can give her a 10 day course of augmentin  in that case and repeat CT chest in 2 months.  Outbound call to patient; denies cough, fever, chills, et cetera; only c/o pain in back near vertebrae.  Per Dr. Melanee I will add her to tumor board this week.  Advised patient to contact clinic for any change in symptoms; patient verbalized understanding.

## 2024-04-15 ENCOUNTER — Other Ambulatory Visit: Payer: Self-pay

## 2024-04-17 ENCOUNTER — Encounter: Payer: Self-pay | Admitting: Oncology

## 2024-04-17 ENCOUNTER — Other Ambulatory Visit: Payer: Self-pay | Admitting: Family Medicine

## 2024-04-17 ENCOUNTER — Other Ambulatory Visit: Payer: Self-pay | Admitting: Physician Assistant

## 2024-04-17 ENCOUNTER — Inpatient Hospital Stay: Attending: Oncology

## 2024-04-17 DIAGNOSIS — F330 Major depressive disorder, recurrent, mild: Secondary | ICD-10-CM

## 2024-04-17 DIAGNOSIS — F419 Anxiety disorder, unspecified: Secondary | ICD-10-CM

## 2024-04-17 NOTE — Addendum Note (Signed)
 Addended by: Guenther Dunshee on: 04/17/2024 09:14 AM   Modules accepted: Orders

## 2024-04-17 NOTE — Telephone Encounter (Signed)
-----   Message from Nurse Jereld GRADE sent at 04/16/2024  1:21 PM EST ----- Dr. Melanee just to confirm since patient is not reporting symptoms at this time, no CT needed in 2 months time correct? ----- Message ----- From: Faustino Jereld, RN Sent: 04/15/2024   2:22 PM EST To: Tinnie ONEIDA Baptist; Jereld Faustino, RN  Lauren can you assist with getting this CT scheduled please? ----- Message ----- From: Melanee Annah BROCKS, MD Sent: 04/13/2024  11:18 AM EST To: Jereld Faustino, RN  Please ask patient if she is having any symptoms of pneumonia. We can give her a 10 day course of augmentin  in that case and repeat CT chest in 2 months ----- Message ----- From: Interface, Rad Results In Sent: 04/12/2024  12:56 PM EST To: Annah BROCKS Melanee, MD

## 2024-04-17 NOTE — Telephone Encounter (Signed)
 Per Dr. Melanee despite patient feeling wellI still need a ct chest without contrast in 2 months. Left voice message indicating a my chart message will be sent in the next few minutes covering details that would have been discussed.

## 2024-04-18 ENCOUNTER — Telehealth: Payer: Self-pay | Admitting: Pulmonary Disease

## 2024-04-18 NOTE — Telephone Encounter (Signed)
 LVMTCB to schedule appointment in 2-3 weeks with Dr. Tamea. May use nodule spot.

## 2024-04-18 NOTE — Telephone Encounter (Signed)
 CT chest scheduled and patient reviewed last my chart message. No further follow up needed at this time.

## 2024-04-19 NOTE — Telephone Encounter (Signed)
 Requested Prescriptions  Pending Prescriptions Disp Refills   busPIRone  (BUSPAR ) 5 MG tablet [Pharmacy Med Name: busPIRone  HCl 5 MG Oral Tablet] 60 tablet 0    Sig: TAKE 1 TABLET BY MOUTH TWICE DAILY AS NEEDED FOR  STRESS  ANXIETY     Psychiatry: Anxiolytics/Hypnotics - Non-controlled Passed - 04/19/2024  9:29 AM      Passed - Valid encounter within last 12 months    Recent Outpatient Visits           6 months ago Chronic obstructive pulmonary disease with acute exacerbation [facility])   [facility] Leavy Mole, PA-C   8 months ago TIA (transient ischemic attack)   [facility] Leavy Mole, PA-C   9 months ago Hospital discharge follow-up   [facility] Leavy Mole, PA-C

## 2024-04-19 NOTE — Telephone Encounter (Signed)
 Requested Prescriptions  Pending Prescriptions Disp Refills   PARoxetine  (PAXIL ) 40 MG tablet [Pharmacy Med Name: PARoxetine  HCl 40 MG Oral Tablet] 90 tablet 0    Sig: TAKE 1 TABLET BY MOUTH ONCE DAILY IN THE MORNING     Psychiatry:  Antidepressants - SSRI Failed - 04/19/2024  9:28 AM      Failed - Valid encounter within last 6 months    Recent Outpatient Visits           6 months ago Chronic obstructive pulmonary disease with acute exacerbation [facility])   [facility] Leavy Mole, PA-C   8 months ago TIA (transient ischemic attack)   [facility] Leavy Mole, PA-C   9 months ago Hospital discharge follow-up   [facility] [place], [place], [place]              Passed - Completed PHQ-2 or PHQ-9 in the last 360 days

## 2024-05-05 ENCOUNTER — Telehealth: Payer: Self-pay

## 2024-05-10 ENCOUNTER — Other Ambulatory Visit (INDEPENDENT_AMBULATORY_CARE_PROVIDER_SITE_OTHER): Payer: Self-pay | Admitting: Vascular Surgery

## 2024-05-13 ENCOUNTER — Telehealth: Payer: Self-pay | Admitting: Family Medicine

## 2024-05-13 ENCOUNTER — Encounter: Payer: Self-pay | Admitting: Pulmonary Disease

## 2024-05-13 ENCOUNTER — Ambulatory Visit: Admitting: Pulmonary Disease

## 2024-05-13 VITALS — BP 134/80 | HR 94 | Temp 97.9°F | Ht 66.0 in | Wt 118.4 lb

## 2024-05-13 DIAGNOSIS — Z87891 Personal history of nicotine dependence: Secondary | ICD-10-CM | POA: Diagnosis not present

## 2024-05-13 DIAGNOSIS — J449 Chronic obstructive pulmonary disease, unspecified: Secondary | ICD-10-CM | POA: Diagnosis not present

## 2024-05-13 DIAGNOSIS — E782 Mixed hyperlipidemia: Secondary | ICD-10-CM

## 2024-05-13 DIAGNOSIS — K219 Gastro-esophageal reflux disease without esophagitis: Secondary | ICD-10-CM

## 2024-05-13 DIAGNOSIS — C3431 Malignant neoplasm of lower lobe, right bronchus or lung: Secondary | ICD-10-CM

## 2024-05-13 DIAGNOSIS — R1319 Other dysphagia: Secondary | ICD-10-CM

## 2024-05-13 DIAGNOSIS — C3490 Malignant neoplasm of unspecified part of unspecified bronchus or lung: Secondary | ICD-10-CM | POA: Diagnosis not present

## 2024-05-13 MED ORDER — ATORVASTATIN CALCIUM 40 MG PO TABS: 40.0000 mg | ORAL_TABLET | Freq: Every day | ORAL | 0 refills | Status: DC

## 2024-05-13 NOTE — Progress Notes (Unsigned)
 Subjective:    Patient ID: Christine Burnett, female    DOB: 1940-12-31, 82 y.o.   MRN: [id number]  Patient Care Team: Leavy Mole, PA-C (Inactive) as PCP - General (Family Medicine) Darliss Rogue, MD as PCP - Cardiology (Cardiology) Marcelino Gales, MD as Consulting Physician (Nephrology) Verdene Gills, RN as Oncology Nurse Navigator Tamea Dedra CROME, MD as Consulting Physician (Pulmonary Disease) Melanee Annah BROCKS, MD as Consulting Physician (Oncology) Lenn Aran, MD as Referring Physician (Radiation Oncology)  Chief Complaint  Patient presents with   COPD    BACKGROUND:   HPI    Review of Systems A 10 point review of systems was performed and it is as noted above otherwise negative.   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   Managed by nephrologist   Lower GI bleed 2025   Non-small cell lung cancer (NSCLC) (HCC)    Osteoporosis    Oxygen  deficiency Me   Risk for falls    Situational disturbance    Stroke (HCC) 2011   tia's x 2   TIA (transient ischemic attack)     Past Surgical History:  Procedure Laterality Date   CAROTID PTA/STENT INTERVENTION Right 04/20/2023   Procedure: CAROTID PTA/STENT INTERVENTION;  Surgeon: Marea Selinda RAMAN, MD;  Location: [facility] INVASIVE CV LAB;  Service: Cardiovascular;  Laterality: Right;  Inominate Artery Stenosis requiring stenting   COLONOSCOPY  2016   COLONOSCOPY WITH PROPOFOL  N/A 06/29/2023   Procedure: COLONOSCOPY WITH PROPOFOL ;  Surgeon: Jinny Carmine, MD;   Location: [facility] ENDOSCOPY;  Service: Endoscopy;  Laterality: N/A;   ESOPHAGUS SURGERY     INTRAMEDULLARY (IM) NAIL INTERTROCHANTERIC Left 03/26/2017   Procedure: INTRAMEDULLARY (IM) NAIL INTERTROCHANTRIC;  Surgeon: Kathlynn Sharper, MD;  Location: [facility] ORS;  Service: Orthopedics;  Laterality: Left;   LEFT HEART CATH AND CORONARY ANGIOGRAPHY N/A 04/15/2021   Procedure: LEFT HEART CATH AND CORONARY ANGIOGRAPHY;  Surgeon: Darron Deatrice LABOR, MD;  Location: [facility] INVASIVE CV LAB;  Service: Cardiovascular;  Laterality: N/A;   RIGHT/LEFT HEART CATH AND CORONARY ANGIOGRAPHY Bilateral 05/15/2022   Procedure: RIGHT/LEFT HEART CATH AND CORONARY ANGIOGRAPHY;  Surgeon: Darron Deatrice LABOR, MD;  Location: [facility] INVASIVE CV LAB;  Service: Cardiovascular;  Laterality: Bilateral;   VIDEO BRONCHOSCOPY WITH ENDOBRONCHIAL NAVIGATION Right 06/28/2020   Procedure: VIDEO BRONCHOSCOPY WITH ENDOBRONCHIAL NAVIGATION CELLVIZIO;  Surgeon: Tamea Dedra CROME, MD;  Location: [facility] ORS;  Service: Pulmonary;  Laterality: Right;    Patient Active Problem List   Diagnosis Date Noted   Physical deconditioning 03/27/2024   Weight loss 03/27/2024   AAA (abdominal aortic aneurysm) without rupture 03/04/2024   Abdominal pain, right upper quadrant 02/12/2024   Senile purpura 09/25/2023   Non-small cell cancer of right lung (HCC) 09/25/2023   Iron deficiency anemia 08/29/2023   GI bleed 06/28/2023   Acute GI bleeding 06/28/2023   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   PAD (peripheral artery disease) 05/18/2023   Stenosis of right internal carotid artery 04/19/2023  Internal carotid artery stenosis, right 04/19/2023   Essential hypertension 04/18/2023   Dyslipidemia 04/18/2023   Anxiety and depression 04/18/2023   Chronic obstructive pulmonary disease (COPD) (HCC) 04/18/2023   TIA (transient ischemic attack) 04/17/2023   Inferior pubic ramus fracture, left, closed, initial encounter (HCC) 11/01/2022   Fall 11/01/2022   Closed  fracture of left inferior pubic ramus, initial encounter (HCC) 11/01/2022   Sacral fracture, closed (HCC) 11/01/2022   Mild episode of recurrent major depressive disorder 08/14/2022   Dyspnea on exertion 05/15/2022   Coronary artery disease    COPD (chronic obstructive pulmonary disease) (HCC) 01/28/2021   Goals of care, counseling/discussion 07/17/2020   Malignant neoplasm of lower lobe of right lung (HCC) 07/04/2020   Nocturnal hypoxia 03/17/2020   Prediabetes 08/13/2018   Chest pain 09/27/2017   Allergic rhinitis 09/27/2017   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   Stage 3b chronic kidney disease (HCC) 08/31/2017   Hyperparathyroidism, 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   Osteoporosis without current pathological fracture 03/30/2017   Cardiac murmur 04/07/2015   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 stage 4 chronic kidney disease, or unspecified chronic kidney disease 02/10/2015   Anxiety 01/05/2015   Hyperlipidemia 01/05/2015   HTN (hypertension) 09/24/2014    Family History  Problem Relation Age of Onset   Other Mother        [place] posioning   Kidney disease Mother    Heart attack Father    Prostate cancer Father    Cancer Father    Heart disease Father    Heart attack Brother    Diabetes Brother    Hypertension Daughter    Kidney cancer Neg Hx    Bladder Cancer Neg Hx    Colon cancer Neg Hx    Colon polyps Neg Hx    Rectal cancer Neg Hx     Social History   Tobacco Use   Smoking status: Former    Current packs/day: 0.00    Average packs/day: 1 pack/day for 61.0 years (61.0 ttl pk-yrs)    Types: Cigarettes    Start date: 03/25/1956    Quit date: 03/25/2017    Years since quitting: 7.1   Smokeless tobacco: Never  Substance Use Topics   Alcohol use: No    Allergies  Allergen Reactions   Zithromax  [Azithromycin ]    Cefuroxime Axetil Rash   Ciprofloxacin  Nausea Only    Unknown   Codeine Other (See Comments)    Patient does not like how the  medication makes you feel     No outpatient medications have been marked as taking for the 05/13/24 encounter (Office Visit) with Tamea Dedra CROME, MD.    Immunization History  Administered Date(s) Administered   Fluad Quad(high Dose 65+) 06/27/2019, 02/08/2021, 04/04/2022   Fluad Trivalent(High Dose 65+) 04/03/2023   INFLUENZA, HIGH DOSE SEASONAL PF 04/07/2015, 03/22/2016, 03/13/2017, 02/25/2018   Influenza Split 03/15/2010, 03/21/2011   Influenza,inj,Quad PF,6+ Mos 03/19/2013, 03/10/2014   Pneumococcal Conjugate-13 11/08/2017   Pneumococcal Polysaccharide-23 01/21/2019   Tdap 05/29/2017        Objective:     Vitals:   05/13/24 1034  Height: 5' 6 (1.676 m)  TempSrc: Temporal        GENERAL: HEAD: Normocephalic, atraumatic.  EYES: Pupils equal, round, reactive to light.  No scleral icterus.  MOUTH:  NECK: Supple. No thyromegaly. Trachea midline. No JVD.  No adenopathy. PULMONARY: Good air entry bilaterally.  No adventitious sounds. CARDIOVASCULAR: S1 and S2. Regular rate and rhythm.  ABDOMEN:  MUSCULOSKELETAL: No joint deformity, no clubbing, no edema.  NEUROLOGIC:  SKIN: Intact,warm,dry. PSYCH:        Assessment & Plan:   No diagnosis found.  No orders of the defined types were placed in this encounter.   No orders of the defined types were placed in this encounter.    Advised if symptoms do not improve or worsen, to please contact office for sooner follow up or seek emergency care.    I spent xxx minutes of dedicated to the care of this patient on the date of this encounter to include pre-visit review of records, face-to-face time with the patient discussing conditions above, post visit ordering of testing, clinical documentation with the electronic health record, making appropriate referrals as documented, and communicating necessary findings to members of the patients care team.   C. Leita Sanders, MD [facility] [place]  [facility]    *This note was dictated using voice recognition software/Dragon.  Despite best efforts to proofread, errors can occur which can change the meaning. Any transcriptional errors that result from this process are unintentional and may not be fully corrected at the time of dictation.

## 2024-05-13 NOTE — Patient Instructions (Signed)
 VISIT SUMMARY:  During today's visit, we discussed your ongoing health concerns, including the potential recurrence of your non-small cell lung carcinoma, gastroesophageal reflux with esophageal dysmotility, and chronic obstructive pulmonary disease (COPD). We reviewed your symptoms and made adjustments to your treatment plan to help manage your conditions more effectively.  YOUR PLAN:  -NON-SMALL CELL LUNG CARCINOMA: Non-small cell lung carcinoma is a type of lung cancer. Although there were concerns about a recurrence based on your recent scan, the current assessment suggests that the findings are more likely due to gastroesophageal reflux rather than cancer recurrence. You have a follow-up scan scheduled on January 13th at 2 PM with Dr. Melanee to monitor your condition.  -GASTROESOPHAGEAL REFLUX WITH ESOPHAGEAL DYSMOTILITY: Gastroesophageal reflux with esophageal dysmotility means that stomach acid is flowing back into your esophagus, causing difficulty swallowing and occasional vomiting. Your CT scan shows esophageal enlargement, likely due to reflux and dysmotility. We have ordered a barium swallow test to evaluate your esophageal function. You should discontinue taking calcium  pills and instead take vitamin D  with K2 supplements.  -CHRONIC OBSTRUCTIVE PULMONARY DISEASE (COPD): COPD is a chronic lung disease that causes breathing difficulties. Your symptoms include occasional wheezing and shortness of breath, which are worsened by humidity. You are currently using Breztri  twice daily, but often skip the evening dose due to sleep disturbances. We discussed the possibility of switching to a once-daily inhaler to improve adherence, but cost is a concern. In the meantime, try taking Breztri  earlier in the evening to help improve your sleep quality.  INSTRUCTIONS:  Please follow up with Dr. Melanee for your scheduled scan on January 13th at 2 PM. Additionally, complete the barium swallow test as ordered to  evaluate your esophageal function. Adjust your Breztri  inhaler administration to earlier in the evening to improve sleep quality, and discontinue calcium  pills, replacing them with vitamin D  with K2 supplements.

## 2024-05-13 NOTE — Telephone Encounter (Signed)
atorvastatin (LIPITOR) 40 MG tablet  ?

## 2024-05-14 MED ORDER — TRELEGY ELLIPTA 100-62.5-25 MCG/ACT IN AEPB: 1.0000 | INHALATION_SPRAY | Freq: Every day | RESPIRATORY_TRACT | Status: DC

## 2024-05-16 ENCOUNTER — Encounter: Payer: Self-pay | Admitting: Nurse Practitioner

## 2024-05-16 ENCOUNTER — Ambulatory Visit: Admitting: Nurse Practitioner

## 2024-05-16 VITALS — BP 118/80 | HR 101 | Temp 97.0°F | Ht 66.0 in | Wt 117.0 lb

## 2024-05-16 DIAGNOSIS — N898 Other specified noninflammatory disorders of vagina: Secondary | ICD-10-CM

## 2024-05-16 DIAGNOSIS — F419 Anxiety disorder, unspecified: Secondary | ICD-10-CM | POA: Diagnosis not present

## 2024-05-16 DIAGNOSIS — E782 Mixed hyperlipidemia: Secondary | ICD-10-CM

## 2024-05-16 DIAGNOSIS — I739 Peripheral vascular disease, unspecified: Secondary | ICD-10-CM

## 2024-05-16 DIAGNOSIS — I10 Essential (primary) hypertension: Secondary | ICD-10-CM | POA: Diagnosis not present

## 2024-05-16 DIAGNOSIS — C3491 Malignant neoplasm of unspecified part of right bronchus or lung: Secondary | ICD-10-CM

## 2024-05-16 DIAGNOSIS — F331 Major depressive disorder, recurrent, moderate: Secondary | ICD-10-CM

## 2024-05-16 DIAGNOSIS — I2510 Atherosclerotic heart disease of native coronary artery without angina pectoris: Secondary | ICD-10-CM

## 2024-05-16 DIAGNOSIS — S22080G Wedge compression fracture of T11-T12 vertebra, subsequent encounter for fracture with delayed healing: Secondary | ICD-10-CM

## 2024-05-16 DIAGNOSIS — J449 Chronic obstructive pulmonary disease, unspecified: Secondary | ICD-10-CM

## 2024-05-16 DIAGNOSIS — S22000G Wedge compression fracture of unspecified thoracic vertebra, subsequent encounter for fracture with delayed healing: Secondary | ICD-10-CM | POA: Insufficient documentation

## 2024-05-16 DIAGNOSIS — J9611 Chronic respiratory failure with hypoxia: Secondary | ICD-10-CM | POA: Insufficient documentation

## 2024-05-16 DIAGNOSIS — I6521 Occlusion and stenosis of right carotid artery: Secondary | ICD-10-CM

## 2024-05-16 DIAGNOSIS — I771 Stricture of artery: Secondary | ICD-10-CM

## 2024-05-16 MED ORDER — FLUCONAZOLE 150 MG PO TABS: 150.0000 mg | ORAL_TABLET | ORAL | 0 refills | Status: AC | PRN

## 2024-05-16 MED ORDER — BUSPIRONE HCL 5 MG PO TABS: 5.0000 mg | ORAL_TABLET | Freq: Every day | ORAL | 1 refills | Status: AC

## 2024-05-16 NOTE — Progress Notes (Addendum)
 BP 118/80   Pulse (!) 101   Temp (!) 97 F (36.1 C)   Ht 5' 6 (1.676 m)   Wt 117 lb (53.1 kg)   SpO2 97%   BMI 18.88 kg/m    Subjective:    Patient ID: Christine Burnett, female    DOB: [birth year], 83 y.o.   MRN: [id number]  HPI: Christine Burnett is a 83 y.o. female  Chief Complaint  Patient presents with   Back Pain   Medication Management    Pt has questions in regards to buspar  and paxil  as well as vitamin D . States she takes the buspar  everyday.    Discussed the use of AI scribe software for clinical note transcription with the patient, who gave verbal consent to proceed.  History of Present Illness Christine Burnett Christine Burnett is an 83 year old female with non-small cell lung carcinoma who presents with acute back pain.  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, managed by pulmonology - Recent CT scan showed esophageal enlargement likely due to reflux and dysmotility - Barium swallow test ordered to evaluate esophageal dysfunction  Chronic obstructive pulmonary disease and respiratory failure - COPD managed with daily Breztri  inhaler and Trelegy - Chronic respiratory failure with hypoxia, managed by pulmonology  Psychiatric symptoms - Anxiety and depression managed with Paxil  40 mg daily and buspirone  5 mg twice daily as needed for stress and anxiety - Prefers buspirone   twice daily, stating she feels good on that regimen - Takes both medications together in the morning  Cardiovascular and renal disease - Hyperlipidemia managed with atorvastatin  40 mg daily - Brilinta  90 mg twice daily for vascular health - Stenosis of right internal carotid artery-continue - Brilinta  90 mg twice daily for vascular health - Peripheral artery disease- continue -  atorvastatin  40 mg daily, Brilinta  90 mg twice daily for vascular health - Indominant artery stenosis- continue - atorvastatin  40 mg daily, Brilinta  90 mg twice daily for vascular health - Coronary artery disease- continue -  atorvastatin  40 mg daily, Brilinta  90 mg twice daily for vascular health - Chronic kidney disease stage three with GFR of 31, followed by nephrology - Hypertension stable, not currently on blood pressure medication  Parathyroid dysfunction - History of hyperparathyroidism - Most recent PTH levels are normal  Vaginal discharge - Clear or jelly-like vaginal discharge         05/16/2024    1:54 PM 09/25/2023   11:19 AM 07/09/2023    2:46 PM  Depression screen PHQ 2/9  Decreased Interest 0 0 3  Down, Depressed, Hopeless 0 1 3  PHQ - 2 Score 0 1 6  Altered sleeping 0 0 0  Tired, decreased energy 0 1 3  Change in appetite 0 0 0  Feeling bad or failure about yourself  0 0 0  Trouble concentrating 0 0 0  Moving slowly or fidgety/restless 0 0 0  Suicidal thoughts 0 0  0  PHQ-9 Score 0 2  9   Difficult doing work/chores  Not difficult at all      Data saved with a previous flowsheet row definition    Relevant past medical, surgical, family and social history reviewed and updated as indicated. Interim medical history since our last visit reviewed. Allergies and medications reviewed and updated.  Review of Systems  Ten systems reviewed and is negative except as mentioned in HPI      Objective:      BP 118/80   Pulse (!) 101   Temp (!) 97 F (36.1 C)   Ht 5' 6 (1.676 m)   Wt  117 lb (53.1 kg)   SpO2 97%   BMI 18.88 kg/m    Wt Readings from Last 3 Encounters:  05/16/24 117 lb (53.1 kg)  05/13/24 118 lb 6.4 oz (53.7 kg)  03/27/24 116 lb (52.6 kg)    Physical Exam GENERAL: Alert, cooperative, well developed, no acute distress HEENT: Normocephalic, normal oropharynx, moist mucous membranes CHEST: Clear to auscultation bilaterally, No wheezes, rhonchi, or crackles CARDIOVASCULAR: Normal heart rate and rhythm, S1 and S2 normal without murmurs ABDOMEN: Soft, non-tender, non-distended, without organomegaly, Normal bowel sounds EXTREMITIES: No cyanosis or edema NEUROLOGICAL: Cranial nerves grossly intact, Moves all extremities without gross motor or sensory deficit  Results for orders placed or performed in visit on 03/05/24  C difficile quick screen w PCR reflex   Collection Time: 03/05/24  2:00 PM   Specimen: STOOL  Result Value Ref Range   C Diff antigen NEGATIVE NEGATIVE   C Diff toxin NEGATIVE NEGATIVE   C Diff interpretation No C. difficile detected.           Assessment & Plan:   Problem List Items Addressed This Visit       Cardiovascular and Mediastinum   Coronary artery disease   Essential hypertension     Respiratory   Chronic obstructive pulmonary disease (COPD) (HCC)   RESOLVED: Chronic respiratory failure with hypoxia (HCC)     Musculoskeletal and Integument   Compression fracture of thoracic vertebra with delayed healing   Relevant Orders   Ambulatory referral to Orthopedic Surgery     Other   Anxiety - Primary   Relevant Medications   busPIRone  (BUSPAR ) 5 MG tablet   Hyperlipidemia   Moderate episode of recurrent major depressive disorder (HCC)   Relevant Medications   busPIRone  (BUSPAR ) 5 MG tablet   Other Visit Diagnoses       Vaginal discharge       Relevant Medications   fluconazole  (DIFLUCAN ) 150 MG tablet        Assessment and Plan Assessment & Plan Compression fracture of T11 vertebra Chronic pain in  the lower back, middle back, and between the shoulder blades, likely due to a compression fracture of the T11 vertebra. Pain has persisted for months, exacerbated by standing and activities such as doing dishes. Previous physical therapy was ineffective. She is hesitant to see another doctor but is considering orthopedic evaluation for potential interventions such as cortisone injections, which may provide relief for up to three months. - Referred to orthopedics for evaluation and potential intervention for T11 compression fracture. - Discussed potential for cortisone injections to alleviate pain, with effects lasting up to three months.  Vaginal discharge, likely candidiasis Clear liquid or jelly-like vaginal discharge, likely due to a yeast infection secondary to altered vaginal pH, possibly from not rinsing mouth after using Breztri  inhaler. - Prescribed antifungal medication  for yeast infection.  Anxiety and recurrent major depressive disorder Currently managed with Paxil  40 mg daily and buspirone  5 mg twice daily as needed. She reports feeling well on this regimen and wishes to continue taking buspirone  twice daily. No interactions between Paxil  and buspirone . - Continue Paxil  40 mg daily. - Continue buspirone  5 mg twice daily as needed. - Refilled buspirone  prescription.  Chronic obstructive pulmonary disease and respiratory failure - COPD managed with daily Breztri  inhaler and Trelegy  General health maintenance Discussion about calcium  supplementation and esophageal enlargement. Advised to discontinue calcium  pills and take vitamin D  with K2 supplements to aid calcium  absorption into bones. - Discontinued calcium  pills.  Cardiovascular and renal disease - Hyperlipidemia managed with atorvastatin  40 mg daily - Brilinta  90 mg twice daily for vascular health - Stenosis of right internal carotid artery-continue - Brilinta  90 mg twice daily for vascular health - Peripheral artery disease-  continue -  atorvastatin  40 mg daily, Brilinta  90 mg twice daily for vascular health - Indominant artery stenosis- continue - atorvastatin  40 mg daily, Brilinta  90 mg twice daily for vascular health - Coronary artery disease- continue -  atorvastatin  40 mg daily, Brilinta  90 mg twice daily for vascular health - Chronic kidney disease stage three with GFR of 31, followed by nephrology - Hypertension stable, not currently on blood pressure medication      Follow up plan: Return in about 6 months (around 11/14/2024) for follow up.

## 2024-05-22 ENCOUNTER — Encounter: Payer: Self-pay | Admitting: Oncology

## 2024-06-03 ENCOUNTER — Inpatient Hospital Stay: Attending: Oncology

## 2024-06-03 DIAGNOSIS — R197 Diarrhea, unspecified: Secondary | ICD-10-CM | POA: Insufficient documentation

## 2024-06-03 DIAGNOSIS — D509 Iron deficiency anemia, unspecified: Secondary | ICD-10-CM | POA: Insufficient documentation

## 2024-06-03 DIAGNOSIS — N183 Chronic kidney disease, stage 3 unspecified: Secondary | ICD-10-CM

## 2024-06-03 LAB — IRON AND TIBC
Iron: 55 ug/dL (ref 28–170)
Saturation Ratios: 22 % (ref 10.4–31.8)
TIBC: 246 ug/dL — ABNORMAL LOW (ref 250–450)
UIBC: 191 ug/dL

## 2024-06-03 LAB — CBC WITH DIFFERENTIAL (CANCER CENTER ONLY)
Abs Immature Granulocytes: 0.09 K/uL — ABNORMAL HIGH (ref 0.00–0.07)
Basophils Absolute: 0 K/uL (ref 0.0–0.1)
Basophils Relative: 0 %
Eosinophils Absolute: 0.1 K/uL (ref 0.0–0.5)
Eosinophils Relative: 1 %
HCT: 32.8 % — ABNORMAL LOW (ref 36.0–46.0)
Hemoglobin: 10.7 g/dL — ABNORMAL LOW (ref 12.0–15.0)
Immature Granulocytes: 1 %
Lymphocytes Relative: 14 %
Lymphs Abs: 1.2 K/uL (ref 0.7–4.0)
MCH: 33.1 pg (ref 26.0–34.0)
MCHC: 32.6 g/dL (ref 30.0–36.0)
MCV: 101.5 fL — ABNORMAL HIGH (ref 80.0–100.0)
Monocytes Absolute: 0.3 K/uL (ref 0.1–1.0)
Monocytes Relative: 3 %
Neutro Abs: 7 K/uL (ref 1.7–7.7)
Neutrophils Relative %: 81 %
Platelet Count: 218 K/uL (ref 150–400)
RBC: 3.23 MIL/uL — ABNORMAL LOW (ref 3.87–5.11)
RDW: 14.1 % (ref 11.5–15.5)
WBC Count: 8.7 K/uL (ref 4.0–10.5)
nRBC: 0 % (ref 0.0–0.2)

## 2024-06-03 LAB — FERRITIN: Ferritin: 311 ng/mL — ABNORMAL HIGH (ref 11–307)

## 2024-06-10 ENCOUNTER — Other Ambulatory Visit (INDEPENDENT_AMBULATORY_CARE_PROVIDER_SITE_OTHER): Payer: Self-pay | Admitting: Vascular Surgery

## 2024-06-10 ENCOUNTER — Other Ambulatory Visit: Payer: Self-pay | Admitting: Nurse Practitioner

## 2024-06-10 DIAGNOSIS — E782 Mixed hyperlipidemia: Secondary | ICD-10-CM

## 2024-06-11 NOTE — Telephone Encounter (Signed)
 Requested Prescriptions  Pending Prescriptions Disp Refills   atorvastatin  (LIPITOR) 40 MG tablet [Pharmacy Med Name: Atorvastatin  Calcium  40 MG Oral Tablet] 30 tablet 0    Sig: TAKE 1 TABLET BY MOUTH AT BEDTIME     Cardiovascular:  Antilipid - Statins Failed - 06/11/2024  2:12 PM      Failed - Lipid Panel in normal range within the last 12 months    Cholesterol, Total  Date Value Ref Range Status  03/23/2021 113 100 - 199 mg/dL Final   Cholesterol  Date Value Ref Range Status  04/18/2023 103 0 - 200 mg/dL Final  98/70/7983 855 0 - 200 mg/dL Final   Ldl Cholesterol, Calc  Date Value Ref Range Status  07/03/2014 47 0 - 100 mg/dL Final   LDL Cholesterol (Calc)  Date Value Ref Range Status  08/01/2022 45 mg/dL (calc) Final    Comment:    Reference range: <100 . Desirable range <100 mg/dL for primary prevention;   <70 mg/dL for patients with CHD or diabetic patients  with > or = 2 CHD risk factors. SABRA LDL-C is now calculated using the Martin-Hopkins  calculation, which is a validated novel method providing  better accuracy than the Friedewald equation in the  estimation of LDL-C.  Gladis APPLETHWAITE et al. SANDREA. 7986;689(80): 2061-2068  (http://education.[employer].com/faq/FAQ164)    LDL Cholesterol  Date Value Ref Range Status  04/18/2023 42 0 - 99 mg/dL Final    Comment:           Total Cholesterol/HDL:CHD Risk Coronary Heart Disease Risk Table                     Men   Women  1/2 Average Risk   3.4   3.3  Average Risk       5.0   4.4  2 X Average Risk   9.6   7.1  3 X Average Risk  23.4   11.0        Use the calculated Patient Ratio above and the CHD Risk Table to determine the patient's CHD Risk.        ATP III CLASSIFICATION (LDL):  <100     mg/dL   Optimal  899-870  mg/dL   Near or Above                    Optimal  130-159  mg/dL   Borderline  839-810  mg/dL   High  >809     mg/dL   Very High Performed at [facility], [address]  [place], [place] [address]    HDL Cholesterol  Date Value Ref Range Status  07/03/2014 33 (L) 40 - 60 mg/dL Final   HDL  Date Value Ref Range Status  04/18/2023 42 >40 mg/dL Final  89/80/7977 45 >60 mg/dL Final   Triglycerides  Date Value Ref Range Status  04/18/2023 95 <150 mg/dL Final  98/70/7983 679 (H) 0 - 200 mg/dL Final         Passed - Patient is not pregnant      Passed - Valid encounter within last 12 months    Recent Outpatient Visits           3 weeks ago Anxiety   [facility] Gareth Mliss FALCON, FNP   8 months ago Chronic obstructive pulmonary disease with acute exacerbation [facility])   [facility] [place], Michelene, PA-C   10 months ago TIA (  transient ischemic attack)   [facility] Leavy Mole, PA-C   11 months ago Hospital discharge follow-up   [facility] Leavy Mole, PA-C

## 2024-06-12 ENCOUNTER — Telehealth: Payer: Self-pay | Admitting: Oncology

## 2024-06-12 ENCOUNTER — Encounter: Admitting: Nurse Practitioner

## 2024-06-12 NOTE — Telephone Encounter (Signed)
 Called pt to confirm CT appt for 1/13 - pt confirmed date/time/location - [facility]

## 2024-06-16 ENCOUNTER — Encounter: Payer: Self-pay | Admitting: Oncology

## 2024-06-17 ENCOUNTER — Ambulatory Visit
Admission: RE | Admit: 2024-06-17 | Discharge: 2024-06-17 | Disposition: A | Discharge status: Home / Self Care | Source: Ambulatory Visit | Attending: Oncology | Admitting: Oncology

## 2024-06-17 ENCOUNTER — Ambulatory Visit
Admission: RE | Admit: 2024-06-17 | Discharge: 2024-06-17 | Disposition: A | Source: Ambulatory Visit | Attending: Pulmonary Disease | Admitting: Pulmonary Disease

## 2024-06-17 ENCOUNTER — Other Ambulatory Visit: Payer: Self-pay | Admitting: Pulmonary Disease

## 2024-06-17 DIAGNOSIS — R1319 Other dysphagia: Secondary | ICD-10-CM | POA: Insufficient documentation

## 2024-06-17 DIAGNOSIS — C3431 Malignant neoplasm of lower lobe, right bronchus or lung: Secondary | ICD-10-CM | POA: Insufficient documentation

## 2024-06-17 DIAGNOSIS — K219 Gastro-esophageal reflux disease without esophagitis: Secondary | ICD-10-CM | POA: Insufficient documentation

## 2024-06-17 DIAGNOSIS — J449 Chronic obstructive pulmonary disease, unspecified: Secondary | ICD-10-CM

## 2024-06-18 ENCOUNTER — Ambulatory Visit: Payer: Self-pay | Admitting: Pulmonary Disease

## 2024-06-19 ENCOUNTER — Ambulatory Visit: Admitting: Pulmonary Disease

## 2024-07-04 ENCOUNTER — Encounter: Payer: Self-pay | Admitting: Pulmonary Disease

## 2024-07-04 ENCOUNTER — Ambulatory Visit: Admitting: Pulmonary Disease

## 2024-07-04 VITALS — BP 118/76 | HR 106 | Temp 97.8°F | Ht 66.0 in | Wt 114.8 lb

## 2024-07-04 DIAGNOSIS — J300 Vasomotor rhinitis: Secondary | ICD-10-CM

## 2024-07-04 DIAGNOSIS — Z87891 Personal history of nicotine dependence: Secondary | ICD-10-CM

## 2024-07-04 DIAGNOSIS — J449 Chronic obstructive pulmonary disease, unspecified: Secondary | ICD-10-CM | POA: Diagnosis not present

## 2024-07-04 DIAGNOSIS — K219 Gastro-esophageal reflux disease without esophagitis: Secondary | ICD-10-CM

## 2024-07-04 DIAGNOSIS — R911 Solitary pulmonary nodule: Secondary | ICD-10-CM

## 2024-07-04 DIAGNOSIS — C3491 Malignant neoplasm of unspecified part of right bronchus or lung: Secondary | ICD-10-CM | POA: Diagnosis not present

## 2024-07-04 DIAGNOSIS — K224 Dyskinesia of esophagus: Secondary | ICD-10-CM | POA: Diagnosis not present

## 2024-07-04 DIAGNOSIS — C3431 Malignant neoplasm of lower lobe, right bronchus or lung: Secondary | ICD-10-CM

## 2024-07-04 MED ORDER — TRELEGY ELLIPTA 100-62.5-25 MCG/ACT IN AEPB: 1.0000 | INHALATION_SPRAY | Freq: Every day | RESPIRATORY_TRACT | 11 refills | Status: AC

## 2024-07-04 MED ORDER — IPRATROPIUM BROMIDE 0.03 % NA SOLN: 2.0000 | Freq: Two times a day (BID) | NASAL | 12 refills | Status: AC

## 2024-07-04 NOTE — Patient Instructions (Addendum)
 VISIT SUMMARY:  During today's visit, we discussed your ongoing issues with COPD, chronic silent aspiration, and vasomotor rhinitis. We reviewed your current treatments and made some adjustments to help manage your symptoms more effectively.  YOUR PLAN:  -CHRONIC OBSTRUCTIVE PULMONARY DISEASE (COPD): COPD is a chronic lung condition that makes it hard to breathe. You should continue using your Trelegy inhaler, one puff daily, to help reduce inflammation. We provided you with samples of the inhaler and will ensure your prescription is filled after you use the samples.  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 This is likely due to issues with the movement of your esophagus and reflux. We referred you to a gastroenterologist for further evaluation and management. In the meantime, take small bites, eat slowly, avoid lying flat immediately after eating, and consider using a straw to help with drinking. We also discussed a referral to a speech pathologist for swallowing techniques, but you declined.  -VASOMOTOR RHINITIS: Vasomotor rhinitis is a condition that causes a runny nose. We prescribed a nasal spray (Atrovent /ipratropium) to help manage your symptoms.  INSTRUCTIONS:  Please follow up with the gastroenterologist as referred for further evaluation of your chronic aspiration. Continue using your Trelegy inhaler daily and use the nasal spray as prescribed. If you have any questions or concerns, please do not hesitate to contact our office.

## 2024-07-07 ENCOUNTER — Encounter: Payer: Self-pay | Admitting: Pulmonary Disease

## 2024-08-19 ENCOUNTER — Encounter (INDEPENDENT_AMBULATORY_CARE_PROVIDER_SITE_OTHER)

## 2024-08-19 ENCOUNTER — Ambulatory Visit (INDEPENDENT_AMBULATORY_CARE_PROVIDER_SITE_OTHER): Admitting: Vascular Surgery

## 2024-09-01 ENCOUNTER — Other Ambulatory Visit

## 2024-09-01 ENCOUNTER — Ambulatory Visit: Admitting: Oncology

## 2024-09-02 ENCOUNTER — Other Ambulatory Visit (INDEPENDENT_AMBULATORY_CARE_PROVIDER_SITE_OTHER)

## 2024-09-02 ENCOUNTER — Encounter (INDEPENDENT_AMBULATORY_CARE_PROVIDER_SITE_OTHER)

## 2024-09-02 ENCOUNTER — Ambulatory Visit (INDEPENDENT_AMBULATORY_CARE_PROVIDER_SITE_OTHER): Admitting: Vascular Surgery

## 2024-09-17 ENCOUNTER — Ambulatory Visit: Admitting: Nurse Practitioner

## 2024-10-02 ENCOUNTER — Ambulatory Visit: Admitting: Pulmonary Disease
